# Patient Record
Sex: Female | Born: 1935
Health system: Southern US, Community
[De-identification: ages and names within clinical notes are randomized; demographics above are authoritative.]

## PROBLEM LIST (undated history)

## (undated) DIAGNOSIS — M545 Low back pain, unspecified: Secondary | ICD-10-CM

## (undated) DIAGNOSIS — K922 Gastrointestinal hemorrhage, unspecified: Secondary | ICD-10-CM

## (undated) DIAGNOSIS — M2550 Pain in unspecified joint: Secondary | ICD-10-CM

## (undated) DIAGNOSIS — I10 Essential (primary) hypertension: Secondary | ICD-10-CM

## (undated) DIAGNOSIS — N189 Chronic kidney disease, unspecified: Secondary | ICD-10-CM

## (undated) DIAGNOSIS — K5792 Diverticulitis of intestine, part unspecified, without perforation or abscess without bleeding: Secondary | ICD-10-CM

## (undated) DIAGNOSIS — I82409 Acute embolism and thrombosis of unspecified deep veins of unspecified lower extremity: Secondary | ICD-10-CM

## (undated) DIAGNOSIS — N816 Rectocele: Secondary | ICD-10-CM

## (undated) DIAGNOSIS — N879 Dysplasia of cervix uteri, unspecified: Secondary | ICD-10-CM

## (undated) DIAGNOSIS — R0602 Shortness of breath: Secondary | ICD-10-CM

## (undated) DIAGNOSIS — C801 Malignant (primary) neoplasm, unspecified: Secondary | ICD-10-CM

## (undated) DIAGNOSIS — H3530 Unspecified macular degeneration: Secondary | ICD-10-CM

## (undated) DIAGNOSIS — I6529 Occlusion and stenosis of unspecified carotid artery: Secondary | ICD-10-CM

## (undated) DIAGNOSIS — I890 Lymphedema, not elsewhere classified: Secondary | ICD-10-CM

## (undated) HISTORY — DX: Diverticulitis of intestine, part unspecified, without perforation or abscess without bleeding: K57.92

## (undated) HISTORY — DX: Dysplasia of cervix uteri, unspecified: N87.9

## (undated) HISTORY — DX: Occlusion and stenosis of unspecified carotid artery: I65.29

## (undated) HISTORY — DX: Essential (primary) hypertension: I10

## (undated) HISTORY — DX: Malignant (primary) neoplasm, unspecified: C80.1

## (undated) HISTORY — DX: Low back pain, unspecified: M54.50

## (undated) HISTORY — DX: Gastrointestinal hemorrhage, unspecified: K92.2

## (undated) HISTORY — DX: Lymphedema, not elsewhere classified: I89.0

## (undated) HISTORY — PX: OTHER SURGICAL HISTORY: SHX169

## (undated) HISTORY — DX: Rectocele: N81.6

## (undated) HISTORY — PX: OVARIAN CYST SURGERY: SHX726

## (undated) HISTORY — PX: SKIN CANCER EXCISION: SHX779

## (undated) HISTORY — PX: RECTOCELE REPAIR: SHX761

## (undated) HISTORY — DX: Low back pain: M54.5

## (undated) HISTORY — DX: Acute embolism and thrombosis of unspecified deep veins of unspecified lower extremity: I82.409

## (undated) HISTORY — DX: Shortness of breath: R06.02

## (undated) HISTORY — DX: Unspecified macular degeneration: H35.30

## (undated) HISTORY — DX: Pain in unspecified joint: M25.50

## (undated) HISTORY — PX: ABDOMINAL HYSTERECTOMY: SHX81

## (undated) HISTORY — DX: Chronic kidney disease, unspecified: N18.9

---

## 1998-01-17 ENCOUNTER — Ambulatory Visit: Admission: RE | Admit: 1998-01-17 | Discharge: 1998-01-17 | Payer: Self-pay | Admitting: Internal Medicine

## 1998-05-25 ENCOUNTER — Ambulatory Visit (HOSPITAL_COMMUNITY): Admission: RE | Admit: 1998-05-25 | Discharge: 1998-05-25 | Payer: Self-pay | Admitting: Obstetrics & Gynecology

## 2000-03-06 ENCOUNTER — Encounter: Admission: RE | Admit: 2000-03-06 | Discharge: 2000-03-06 | Payer: Self-pay | Admitting: Internal Medicine

## 2000-03-06 ENCOUNTER — Encounter: Payer: Self-pay | Admitting: Internal Medicine

## 2000-11-14 ENCOUNTER — Other Ambulatory Visit: Admission: RE | Admit: 2000-11-14 | Discharge: 2000-11-14 | Payer: Self-pay

## 2000-11-19 ENCOUNTER — Ambulatory Visit (HOSPITAL_COMMUNITY): Admission: RE | Admit: 2000-11-19 | Discharge: 2000-11-19 | Payer: Self-pay

## 2000-11-27 ENCOUNTER — Ambulatory Visit (HOSPITAL_COMMUNITY): Admission: RE | Admit: 2000-11-27 | Discharge: 2000-11-27 | Payer: Self-pay | Admitting: Otolaryngology

## 2000-11-27 ENCOUNTER — Encounter: Payer: Self-pay | Admitting: Otolaryngology

## 2001-02-14 ENCOUNTER — Encounter: Payer: Self-pay | Admitting: Urology

## 2001-02-14 ENCOUNTER — Ambulatory Visit (HOSPITAL_COMMUNITY): Admission: RE | Admit: 2001-02-14 | Discharge: 2001-02-14 | Payer: Self-pay | Admitting: Urology

## 2001-02-26 ENCOUNTER — Encounter: Payer: Self-pay | Admitting: Urology

## 2001-02-26 ENCOUNTER — Ambulatory Visit (HOSPITAL_COMMUNITY): Admission: RE | Admit: 2001-02-26 | Discharge: 2001-02-26 | Payer: Self-pay | Admitting: Urology

## 2001-10-10 ENCOUNTER — Ambulatory Visit (HOSPITAL_COMMUNITY): Admission: RE | Admit: 2001-10-10 | Discharge: 2001-10-10 | Payer: Self-pay

## 2002-08-31 ENCOUNTER — Ambulatory Visit (HOSPITAL_COMMUNITY): Admission: RE | Admit: 2002-08-31 | Discharge: 2002-08-31 | Payer: Self-pay | Admitting: Pulmonary Disease

## 2002-09-16 ENCOUNTER — Encounter: Payer: Self-pay | Admitting: Unknown Physician Specialty

## 2002-09-16 ENCOUNTER — Ambulatory Visit (HOSPITAL_COMMUNITY): Admission: RE | Admit: 2002-09-16 | Discharge: 2002-09-16 | Payer: Self-pay | Admitting: Unknown Physician Specialty

## 2003-09-06 ENCOUNTER — Encounter: Payer: Self-pay | Admitting: Orthopedic Surgery

## 2003-10-01 ENCOUNTER — Ambulatory Visit (HOSPITAL_COMMUNITY): Admission: RE | Admit: 2003-10-01 | Discharge: 2003-10-01 | Payer: Self-pay | Admitting: Orthopedic Surgery

## 2003-10-12 ENCOUNTER — Other Ambulatory Visit: Admission: RE | Admit: 2003-10-12 | Discharge: 2003-10-12 | Payer: Self-pay | Admitting: Dermatology

## 2003-10-27 ENCOUNTER — Encounter (HOSPITAL_COMMUNITY): Admission: RE | Admit: 2003-10-27 | Discharge: 2003-11-26 | Payer: Self-pay | Admitting: Oncology

## 2003-10-27 ENCOUNTER — Encounter: Admission: RE | Admit: 2003-10-27 | Discharge: 2003-10-27 | Payer: Self-pay | Admitting: Oncology

## 2003-11-09 ENCOUNTER — Ambulatory Visit (HOSPITAL_COMMUNITY): Admission: RE | Admit: 2003-11-09 | Discharge: 2003-11-09 | Payer: Self-pay | Admitting: Pulmonary Disease

## 2004-01-10 ENCOUNTER — Ambulatory Visit: Admission: RE | Admit: 2004-01-10 | Discharge: 2004-01-10 | Payer: Self-pay | Admitting: Pulmonary Disease

## 2004-02-09 ENCOUNTER — Encounter: Admission: RE | Admit: 2004-02-09 | Discharge: 2004-02-09 | Payer: Self-pay | Admitting: Oncology

## 2004-05-02 ENCOUNTER — Inpatient Hospital Stay (HOSPITAL_COMMUNITY): Admission: RE | Admit: 2004-05-02 | Discharge: 2004-05-05 | Payer: Self-pay | Admitting: Orthopedic Surgery

## 2004-05-02 ENCOUNTER — Ambulatory Visit: Payer: Self-pay | Admitting: Orthopedic Surgery

## 2004-05-02 HISTORY — PX: OTHER SURGICAL HISTORY: SHX169

## 2004-05-23 ENCOUNTER — Ambulatory Visit: Payer: Self-pay | Admitting: Orthopedic Surgery

## 2004-06-26 ENCOUNTER — Ambulatory Visit: Payer: Self-pay | Admitting: Orthopedic Surgery

## 2004-07-26 ENCOUNTER — Ambulatory Visit: Payer: Self-pay | Admitting: Orthopedic Surgery

## 2004-08-07 ENCOUNTER — Ambulatory Visit: Payer: Self-pay | Admitting: Orthopedic Surgery

## 2004-08-28 ENCOUNTER — Ambulatory Visit: Payer: Self-pay | Admitting: Orthopedic Surgery

## 2004-11-29 ENCOUNTER — Ambulatory Visit: Payer: Self-pay | Admitting: Orthopedic Surgery

## 2004-12-14 ENCOUNTER — Ambulatory Visit: Payer: Self-pay | Admitting: Orthopedic Surgery

## 2005-01-04 ENCOUNTER — Ambulatory Visit: Payer: Self-pay | Admitting: Orthopedic Surgery

## 2005-06-06 ENCOUNTER — Ambulatory Visit: Payer: Self-pay | Admitting: Orthopedic Surgery

## 2005-08-02 ENCOUNTER — Other Ambulatory Visit: Admission: RE | Admit: 2005-08-02 | Discharge: 2005-08-02 | Payer: Self-pay | Admitting: *Deleted

## 2005-08-15 ENCOUNTER — Other Ambulatory Visit: Admission: RE | Admit: 2005-08-15 | Discharge: 2005-08-15 | Payer: Self-pay | Admitting: *Deleted

## 2005-08-20 ENCOUNTER — Ambulatory Visit: Payer: Self-pay | Admitting: Orthopedic Surgery

## 2005-08-21 ENCOUNTER — Encounter (HOSPITAL_COMMUNITY): Admission: RE | Admit: 2005-08-21 | Discharge: 2005-09-20 | Payer: Self-pay | Admitting: Orthopedic Surgery

## 2005-09-10 ENCOUNTER — Ambulatory Visit: Payer: Self-pay | Admitting: Orthopedic Surgery

## 2005-11-28 ENCOUNTER — Ambulatory Visit (HOSPITAL_COMMUNITY): Admission: RE | Admit: 2005-11-28 | Discharge: 2005-11-28 | Payer: Self-pay | Admitting: Pulmonary Disease

## 2006-02-28 ENCOUNTER — Observation Stay (HOSPITAL_COMMUNITY): Admission: RE | Admit: 2006-02-28 | Discharge: 2006-03-03 | Payer: Self-pay | Admitting: Obstetrics and Gynecology

## 2007-06-06 ENCOUNTER — Ambulatory Visit (HOSPITAL_COMMUNITY): Admission: RE | Admit: 2007-06-06 | Discharge: 2007-06-06 | Payer: Self-pay | Admitting: Pulmonary Disease

## 2007-06-20 ENCOUNTER — Observation Stay (HOSPITAL_COMMUNITY): Admission: EM | Admit: 2007-06-20 | Discharge: 2007-06-22 | Payer: Self-pay | Admitting: Emergency Medicine

## 2008-03-11 ENCOUNTER — Encounter: Payer: Self-pay | Admitting: Orthopedic Surgery

## 2008-03-11 ENCOUNTER — Ambulatory Visit (HOSPITAL_COMMUNITY): Admission: RE | Admit: 2008-03-11 | Discharge: 2008-03-11 | Payer: Self-pay | Admitting: Pulmonary Disease

## 2008-09-01 ENCOUNTER — Encounter: Payer: Self-pay | Admitting: Orthopedic Surgery

## 2009-02-07 ENCOUNTER — Ambulatory Visit: Payer: Self-pay | Admitting: Orthopedic Surgery

## 2009-02-07 DIAGNOSIS — M545 Low back pain: Secondary | ICD-10-CM

## 2009-02-07 DIAGNOSIS — M25559 Pain in unspecified hip: Secondary | ICD-10-CM | POA: Insufficient documentation

## 2009-02-08 ENCOUNTER — Telehealth (INDEPENDENT_AMBULATORY_CARE_PROVIDER_SITE_OTHER): Payer: Self-pay | Admitting: *Deleted

## 2009-02-28 ENCOUNTER — Encounter (HOSPITAL_COMMUNITY): Admission: RE | Admit: 2009-02-28 | Discharge: 2009-03-17 | Payer: Self-pay | Admitting: Orthopedic Surgery

## 2009-02-28 ENCOUNTER — Encounter: Payer: Self-pay | Admitting: Orthopedic Surgery

## 2009-03-14 ENCOUNTER — Telehealth: Payer: Self-pay | Admitting: Orthopedic Surgery

## 2009-06-14 ENCOUNTER — Ambulatory Visit (HOSPITAL_COMMUNITY): Admission: RE | Admit: 2009-06-14 | Discharge: 2009-06-14 | Payer: Self-pay | Admitting: Pulmonary Disease

## 2009-06-20 ENCOUNTER — Ambulatory Visit (HOSPITAL_COMMUNITY): Admission: RE | Admit: 2009-06-20 | Discharge: 2009-06-20 | Payer: Self-pay | Admitting: Pulmonary Disease

## 2009-07-06 ENCOUNTER — Encounter: Payer: Self-pay | Admitting: Orthopedic Surgery

## 2009-07-07 ENCOUNTER — Encounter: Payer: Self-pay | Admitting: Orthopedic Surgery

## 2009-07-12 ENCOUNTER — Encounter: Payer: Self-pay | Admitting: Orthopedic Surgery

## 2009-08-05 ENCOUNTER — Inpatient Hospital Stay (HOSPITAL_COMMUNITY): Admission: EM | Admit: 2009-08-05 | Discharge: 2009-08-08 | Payer: Self-pay | Admitting: Emergency Medicine

## 2009-10-14 ENCOUNTER — Ambulatory Visit (HOSPITAL_COMMUNITY): Admission: RE | Admit: 2009-10-14 | Discharge: 2009-10-14 | Payer: Self-pay | Admitting: Pulmonary Disease

## 2009-12-22 ENCOUNTER — Ambulatory Visit: Payer: Self-pay | Admitting: Orthopedic Surgery

## 2009-12-22 DIAGNOSIS — M25569 Pain in unspecified knee: Secondary | ICD-10-CM

## 2009-12-22 DIAGNOSIS — M79609 Pain in unspecified limb: Secondary | ICD-10-CM

## 2009-12-22 DIAGNOSIS — Z96659 Presence of unspecified artificial knee joint: Secondary | ICD-10-CM

## 2009-12-22 LAB — CONVERTED CEMR LAB
CRP: 8.5 mg/dL — ABNORMAL HIGH (ref <0.6)
Eosinophils Absolute: 0.1 10*3/uL (ref 0.0–0.7)
Eosinophils Relative: 1 % (ref 0–5)
HCT: 35.1 % — ABNORMAL LOW (ref 36.0–46.0)
Hemoglobin: 11.4 g/dL — ABNORMAL LOW (ref 12.0–15.0)
Lymphocytes Relative: 32 % (ref 12–46)
Lymphs Abs: 2.2 10*3/uL (ref 0.7–4.0)
MCV: 97.8 fL (ref 78.0–100.0)
Monocytes Absolute: 0.6 10*3/uL (ref 0.1–1.0)
Platelets: 209 10*3/uL (ref 150–400)
RDW: 14.3 % (ref 11.5–15.5)
WBC: 6.9 10*3/uL (ref 4.0–10.5)

## 2010-01-09 ENCOUNTER — Ambulatory Visit: Payer: Self-pay | Admitting: Orthopedic Surgery

## 2010-02-13 ENCOUNTER — Ambulatory Visit: Payer: Self-pay | Admitting: Orthopedic Surgery

## 2010-02-13 DIAGNOSIS — IMO0002 Reserved for concepts with insufficient information to code with codable children: Secondary | ICD-10-CM

## 2010-03-27 ENCOUNTER — Ambulatory Visit: Payer: Self-pay | Admitting: Orthopedic Surgery

## 2010-04-04 DIAGNOSIS — R0602 Shortness of breath: Secondary | ICD-10-CM

## 2010-04-04 HISTORY — DX: Shortness of breath: R06.02

## 2010-04-05 ENCOUNTER — Observation Stay (HOSPITAL_COMMUNITY)
Admission: EM | Admit: 2010-04-05 | Discharge: 2010-04-07 | Payer: Self-pay | Source: Home / Self Care | Admitting: Emergency Medicine

## 2010-04-05 ENCOUNTER — Ambulatory Visit: Payer: Self-pay | Admitting: Orthopedic Surgery

## 2010-04-06 ENCOUNTER — Encounter: Payer: Self-pay | Admitting: Orthopedic Surgery

## 2010-04-25 ENCOUNTER — Ambulatory Visit: Payer: Self-pay | Admitting: Orthopedic Surgery

## 2010-04-25 DIAGNOSIS — S43086A Other dislocation of unspecified shoulder joint, initial encounter: Secondary | ICD-10-CM | POA: Insufficient documentation

## 2010-05-03 ENCOUNTER — Encounter: Payer: Self-pay | Admitting: Orthopedic Surgery

## 2010-05-09 ENCOUNTER — Encounter (INDEPENDENT_AMBULATORY_CARE_PROVIDER_SITE_OTHER): Payer: Self-pay | Admitting: *Deleted

## 2010-05-09 ENCOUNTER — Encounter: Payer: Self-pay | Admitting: Orthopedic Surgery

## 2010-05-16 ENCOUNTER — Ambulatory Visit: Payer: Self-pay | Admitting: Orthopedic Surgery

## 2010-05-24 ENCOUNTER — Encounter (HOSPITAL_COMMUNITY)
Admission: RE | Admit: 2010-05-24 | Discharge: 2010-06-23 | Payer: Self-pay | Source: Home / Self Care | Attending: Orthopedic Surgery | Admitting: Orthopedic Surgery

## 2010-06-06 ENCOUNTER — Encounter: Payer: Self-pay | Admitting: Orthopedic Surgery

## 2010-06-20 ENCOUNTER — Encounter (HOSPITAL_COMMUNITY)
Admission: RE | Admit: 2010-06-20 | Discharge: 2010-07-18 | Payer: Self-pay | Source: Home / Self Care | Attending: Orthopedic Surgery | Admitting: Orthopedic Surgery

## 2010-06-21 ENCOUNTER — Encounter: Payer: Self-pay | Admitting: Orthopedic Surgery

## 2010-06-21 ENCOUNTER — Ambulatory Visit
Admission: RE | Admit: 2010-06-21 | Discharge: 2010-06-21 | Payer: Self-pay | Source: Home / Self Care | Attending: Orthopedic Surgery | Admitting: Orthopedic Surgery

## 2010-06-22 ENCOUNTER — Encounter: Payer: Self-pay | Admitting: Orthopedic Surgery

## 2010-06-26 ENCOUNTER — Encounter (HOSPITAL_COMMUNITY)
Admission: RE | Admit: 2010-06-26 | Discharge: 2010-07-18 | Payer: Self-pay | Source: Home / Self Care | Attending: Orthopedic Surgery | Admitting: Orthopedic Surgery

## 2010-07-03 ENCOUNTER — Encounter: Payer: Self-pay | Admitting: Orthopedic Surgery

## 2010-07-06 ENCOUNTER — Encounter: Payer: Self-pay | Admitting: Orthopedic Surgery

## 2010-07-09 ENCOUNTER — Encounter: Payer: Self-pay | Admitting: Pulmonary Disease

## 2010-07-14 DIAGNOSIS — I82409 Acute embolism and thrombosis of unspecified deep veins of unspecified lower extremity: Secondary | ICD-10-CM

## 2010-07-14 HISTORY — DX: Acute embolism and thrombosis of unspecified deep veins of unspecified lower extremity: I82.409

## 2010-07-20 ENCOUNTER — Encounter (HOSPITAL_COMMUNITY)
Admission: RE | Admit: 2010-07-20 | Discharge: 2010-07-20 | Disposition: A | Discharge status: Home / Self Care | Payer: Medicare Other | Source: Ambulatory Visit | Attending: Orthopedic Surgery | Admitting: Orthopedic Surgery

## 2010-07-20 ENCOUNTER — Encounter: Payer: Self-pay | Admitting: Orthopedic Surgery

## 2010-07-20 DIAGNOSIS — M25519 Pain in unspecified shoulder: Secondary | ICD-10-CM | POA: Insufficient documentation

## 2010-07-20 DIAGNOSIS — M6281 Muscle weakness (generalized): Secondary | ICD-10-CM | POA: Insufficient documentation

## 2010-07-20 DIAGNOSIS — Z5189 Encounter for other specified aftercare: Secondary | ICD-10-CM | POA: Insufficient documentation

## 2010-07-20 NOTE — Miscellaneous (Signed)
Summary: OT progress note  OT progress note   Imported By: Ruffin Pyo 06/22/2010 15:51:12  _____________________________________________________________________  External Attachment:    Type:   Image     Comment:   External Document

## 2010-07-20 NOTE — Consult Note (Signed)
Summary: Consultation Rpt Dr Evlyn Courier  Consultation Rpt Dr Evlyn Courier   Imported By: Ihor Austin 07/28/2009 08:23:22  _____________________________________________________________________  External Attachment:    Type:   Image     Comment:   External Document

## 2010-07-20 NOTE — Assessment & Plan Note (Signed)
Summary: HOSP FOL/UP/DISLOC RT SHOULDER/XRAYS [facility]/MEDICARE/CAF   Visit Type:  new problem Referring Provider:  [facility] Primary Provider:  Dr. Luan Pulling   CC:  right shoulder dislocation.  History of Present Illness: [id number] shoulder dislocation   Date of injury was April 05, 2010.  Patient fell at [facility].  She was admitted to the hospital after closed reduction and emergency room. There was question of an avulsion fracture to the greater tuberosity.  Postreduction films show concentric reduction  Treatment: sling  MEDS: see consult  Complaints:  some movements cause her pain. She is trying not to take pain medicine because she wants to be alert. Therapy has been coming to her house. She has a Marine scientist from [employer],  come to her house 5 days a week for 4 hours a day.  Today, scheduled for: follow up   Allergies: 1)  ! Codeine 2)  ! Hydrocodone 3)  ! Ultram 4)  ! Phenergan  Past History:  Past Medical History: macular degeneration glaucoma precancerous lesions of cervix rectocele Diverticulitis L spine pain Pain in joints hearing loss cholesterol htn Bladder  Review of Systems Constitutional:  Denies weight loss, weight gain, fever, chills, and fatigue. Cardiovascular:  Denies chest pain, palpitations, fainting, and murmurs. Respiratory:  Complains of tightness; denies short of breath, wheezing, couch, pain on inspiration, and snoring . Gastrointestinal:  Denies heartburn, nausea, vomiting, diarrhea, constipation, and blood in your stools. Genitourinary:  Complains of urgency; denies frequency, difficulty urinating, painful urination, flank pain, and bleeding in urine. Neurologic:  Denies numbness, tingling, unsteady gait, dizziness, tremors, and seizure. Musculoskeletal:  Complains of joint pain, swelling, instability, and redness; denies stiffness, heat, and muscle pain. Endocrine:  Complains of exessive urination; denies excessive thirst and heat or cold  intolerance. Psychiatric:  Complains of anxiety; denies nervousness, depression, and hallucinations. Skin:  Complains of itching and redness; denies changes in the skin, poor healing, and rash. HEENT:  Denies blurred or double vision, eye pain, redness, and watering. Immunology:  Denies seasonal allergies, sinus problems, and allergic to bee stings. Hemoatologic:  Denies easy bleeding and brusing.  Physical Exam  Additional Exam:  physical examination shows that she has normal function of the hand, including finger abduction and adduction, finger extension wrist extension wrist flexion elbow flexion elbow extension.  She is an excellent pulse.     Impression & Recommendations:  Problem # 1:  DISLOCATION CLOSED SHOULDER NEC (ICD-831.09) Assessment New  stable RIGHT shoulder dislocation. Patient has been in a sling for 3 weeks. Should be able to advance in physical therapy as tolerated. [facility]. Patient will follow up in 3 weeks for reeval  Orders: Est. Patient Level II [id number])  Patient Instructions: 1)  Please schedule a follow-up appointment in 3 weeks. 2)  Start [facility]  3)  The sling will be removed in a week    Orders Added: 1)  Est. Patient Level II [id number]

## 2010-07-20 NOTE — Letter (Signed)
Summary: Generic Letter update  [facility]  [address] [place]  [place], [place] [address]   Phone: [phone]  Fax: [phone]       07/03/2010  [facility] [address] [place], [place]  [address]  Dear Ms. Forness, and to Whom This May Concern:   RE:  Kebra Halker    Date of Birth Nov 17, 2023   Upon discharge from hospital for RT shoulder dislocation, patient was recommended to have in-patient/custodial nursing home care and support, as she could not go home by herself.  She felt she would better be served with home therapy and home care for her RT shoulder dislocation, rather than nursing home care, which was ordered.  In addition, patient needed ADL's support, including bathing, dressing, toileting.           Sincerely,    Demetrius Revel, MD

## 2010-07-20 NOTE — Assessment & Plan Note (Signed)
Summary: 1 M RE-CHECK/RESP TO MED/MEDICARE,[employer] [employer]/CAF   Visit Type:  Follow-up Referring Allean Montfort:  self  Primary Abhinav Mayorquin:  Dr. Luan Pulling   CC:  left knee pain (leg).  History of Present Illness: I saw Cindy Schultz in the office today for a followup visit.  She is a 77 years old woman with the complaint of:  left knee pain and left leg pain   Medial knee pain and pes bursa tenderness bilateral actually but left greater than right   The patient has been worked up for infection we did not think that she had knee joint infection with periprosthetic infection so we are treating her for inflammatory disease/process which has responded well to prednisone  She still has some medial pain as described above  She has been on both knees as described  Exam shows no pain in the shin anymore but pain along the medial as bursa insertion as well as the medial hamstring.  There are no soft tissue masses there is no joint effusion she has free and easy range of motion in the LEFT knee joint with 120 of flexion and full extension the knee is stable.  Bursitis LEFT knee and RIGHT knee inject LEFT knee  LEFT knee bursitis injection Verbal consent was obtained. The knee was prepped with alcohol and ethyl chloride. 1 cc of depomedrol 40mg /cc and 4 cc of lidocaine 1% was injected. there were no complications.    She stopped prednisone as a prescription has run out I want to see how she does off of it and recheck in 6 weeks   diagnosis bursitis  Allergies: 1)  ! Codeine 2)  ! Hydrocodone 3)  ! Ultram  Review of Systems Constitutional:  Denies fever, chills, and fatigue.   Impression & Recommendations:  Problem # 1:  KNEE PAIN (ICD-719.46)  Orders: Est. Patient Level III [id number])  Problem # 2:  ANSERINE BURSITIS, LEFT (ICD-726.61) Assessment: Comment Only  Orders: Est. Patient Level III [id number]) Joint Aspirate / Injection, Large (20610) Depo- Medrol 40mg  (J1030)  Patient  Instructions: 1)  You have received an injection of cortisone today. You may experience increased pain at the injection site. Apply ice pack to the area for 20 minutes every 2 hours and take 2 xtra strength tylenol every 8 hours. This increased pain will usually resolve in 24 hours. The injection will take effect in 3-10 days.  2)  recheck in 6 weeks

## 2010-07-20 NOTE — Assessment & Plan Note (Signed)
Summary: 3 WK RE-CK LT SHOULDER DISLOCATION/MEDICARE/CAF   Visit Type:  Follow-up Referring Natashia Roseman:  [facility] Primary Zahriyah Joo:  Dr. Luan Pulling   CC:  recheck shoulder.  History of Present Illness: [id number] shoulder dislocation   Date of injury was April 05, 2010.  Mechanism:Patient fell at [facility].  History:She was admitted to the hospital after closed reduction and emergency room. There was question of an avulsion fracture to the greater tuberosity.  Postreduction films show concentric reduction  Treatment: sling  MEDS: Tylenol for pain.  Today is 3 week recheck after PT and sling removal.  subjective complaints of soreness in the RIGHT shoulder  She can abduct the shoulder she can forward elevate the shoulder saw him not to soak where it about the cuff tear possibilities right now  Unlike her continue her physical therapy and follow with me in about a month to check on range of motion and strength in the rotator cuff as well.    Allergies: 1)  ! Codeine 2)  ! Hydrocodone 3)  ! Ultram 4)  ! Phenergan   Impression & Recommendations:  Problem # 1:  DISLOCATION CLOSED SHOULDER NEC (ICD-831.09) Assessment Improved  Orders: Est. Patient Level II [id number])   Orders Added: 1)  Est. Patient Level II [id number]

## 2010-07-20 NOTE — Miscellaneous (Signed)
Summary: OT clinical evaluation  OT clinical evaluation   Imported By: Ruffin Pyo 06/07/2010 07:43:29  _____________________________________________________________________  External Attachment:    Type:   Image     Comment:   External Document

## 2010-07-20 NOTE — Assessment & Plan Note (Signed)
Summary: 1 M RE-CK LT SHOULDER//MEDICARE/CAF   Visit Type:  Follow-up Referring Provider:  [facility] Primary Provider:  Dr. Luan Pulling   CC:  recheck shoulder.  History of Present Illness: [id number] shoulder dislocation   Date of injury was April 05, 2010./ 11 WEEKS POST INJ   Mechanism:Patient fell at [facility].  History:She was admitted to the hospital after closed reduction and emergency room. There was question of an avulsion fracture to the greater tuberosity.  Postreduction films show concentric reduction  Treatment: sling  MEDS: Tylenol for pain, Coumadin 5mg  for blood clot in leg.  Today is one month recheck after PT [facility] to check ROM.  Still has decreased ROM but doing better than before.  ROS: Has some left knee swelling, post TKA./ this is been worked up with laboratory studies x-rays serially and these have been negative.  She actually has bilateral leg pain and tenderness which responds well to prednisone so we will some that      Allergies: 1)  ! Codeine 2)  ! Hydrocodone 3)  ! Ultram 4)  ! Phenergan  Physical Exam  Additional Exam:  as far as her arm goes she has 40 of external rotation 125 of forward elevation she can reach top of her head, she can reach across her chest, she can touch her mouth.  With the RIGHT arm.  LEFT knee no swelling no tenderness  has bilateral leg tenderness from her thighs all the way to her toes   Impression & Recommendations:  Problem # 1:  DISLOCATION CLOSED SHOULDER NEC (ICD-831.09) Assessment Improved  Orders: Est. Patient Level II [id number])  Problem # 2:  LEG PAIN, BILATERAL (ICD-729.5)  restart prednisone 5 mg twice a day followup 2 months  Orders: Est. Patient Level II [id number])  Patient Instructions: 1)  Take Prednisone as prescribed for bilateral leg pain 2)  Come back in 2 months Prescriptions: PREDNISONE 5 MG TABS (PREDNISONE) 1 by mouth two times a day  #60 x 1   Entered and Authorized by:   Arther Abbott MD   Signed by:   Arther Abbott MD on 06/21/2010   Method used:   Print then Give to Patient   RxID:   [id number]    Orders Added: 1)  Est. Patient Level II [id number]

## 2010-07-20 NOTE — Miscellaneous (Signed)
Summary: order for OT outpt [facility]  Clinical Lists Changes  Orders: Added new Referral order of Occupational Therapy (OT) - Signed

## 2010-07-20 NOTE — Letter (Signed)
Summary: Office note from Dr. Neomia Dear  Office note from Dr. Neomia Dear   Imported By: Ruffin Pyo 07/12/2009 11:44:09  _____________________________________________________________________  External Attachment:    Type:   Image     Comment:   External Document

## 2010-07-20 NOTE — Assessment & Plan Note (Signed)
Summary: 57 WK RE-CK/RESP TO MED/MEDICARE,LOYAL AM/CAF   Visit Type:  Follow-up Referring Cindy Schultz:  self  Primary Cindy Schultz:  Dr. Luan Pulling   CC:  recheck.  History of Present Illness: I saw Cindy Schultz in the office today for a followup visit.  She is a 77 years old woman with the complaint of:  left knee pain and left leg pain   Today is 6 week recheck after injection left knee for bursitis.  Injection did not relieve her bursitis, but she can bend her knee better after being on prednisone. She is not walking with a cane anymore.  She is off Prednisone, the Prednisone did help.  Review of systems ;Has pain in both ankles with swelling, Dr. Luan Pulling gave fluid pill and it helped with the swelling, started having cramps, she stopped.    Allergies: 1)  ! Codeine 2)  ! Hydrocodone 3)  ! Ultram 4)  ! Phenergan  Physical Exam  Additional Exam:  LEFT knee prosthesis is stable in anterior posterior, and medial lateral directions. Incision looks good. There is no fluid in the joint. She has tenderness over both medial hamstring tendons and as anserine bursa. If it does not affect her range of motion in any way. He is neurovascularly intact.   Impression & Recommendations:  Problem # 1:  ANSERINE BURSITIS, LEFT (ICD-726.61)  Orders: Est. Patient Level III [id number])  Problem # 2:  TOTAL KNEE FOLLOW-UP (ICD-V43.65)  Orders: Est. Patient Level III [id number])  Medications Added to Medication List This Visit: 1)  Lisinopril 20 Mg Tabs (Lisinopril) 2)  Furosemide 40 Mg Tabs (Furosemide) 3)  Ibuprofen 200 Mg Tabs (Ibuprofen)  Patient Instructions: 1)  TAKE IBUPROFEN 200 MG 1 three times a day AND COME BACK IN 1 MONTH

## 2010-07-20 NOTE — Miscellaneous (Signed)
Summary: [facility] orders  [facility] orders   Imported By: Ruffin Pyo 05/03/2010 08:30:44  _____________________________________________________________________  External Attachment:    Type:   Image     Comment:   External Document

## 2010-07-20 NOTE — Miscellaneous (Signed)
Summary: Rehab Report  Rehab Report   Imported By: Tomie China 06/21/2010 14:08:16  _____________________________________________________________________  External Attachment:    Type:   Image     Comment:   External Document

## 2010-07-20 NOTE — Miscellaneous (Signed)
Summary: [facility] order [facility]  [facility] order [facility]   Imported By: Ihor Austin 04/25/2010 18:35:37  _____________________________________________________________________  External Attachment:    Type:   Image     Comment:   External Document

## 2010-07-20 NOTE — Consult Note (Signed)
Summary: Hospital Consult RT shoulder  Hospital Consult RT shoulder   Imported By: Ihor Austin 04/18/2010 19:04:20  _____________________________________________________________________  External Attachment:    Type:   Image     Comment:   External Document

## 2010-07-20 NOTE — Letter (Signed)
Summary: Generic Letter  [facility]  [address] [place]  [place], [place] [address]   Phone: [phone]  Fax: [phone]    06/21/2010    This is a letter in support of the patient mentioned below regarding her RIGHT shoulder injury on October 19 of 2011.  She was not a patient that was felt to need inpatient rehabilitation and would better be served with home therapy and home care for her RIGHT shoulder dislocation.  She did need for care and support including rides to the office.  She needed ADLs support.  [facility] [address] [place], [place]  [address]   Sincerely,   Arther Abbott MD [facility]

## 2010-07-20 NOTE — Miscellaneous (Signed)
Summary: Home Health request for therapy order  Home Health request for therapy order   Imported By: Ruffin Pyo 05/10/2010 09:06:04  _____________________________________________________________________  External Attachment:    Type:   Image     Comment:   External Document

## 2010-07-20 NOTE — Assessment & Plan Note (Signed)
Summary: LEFT KNEE PAIN NEEDS XR/MEDICARE/[employer] [employer]/BSF   Visit Type:  Follow-up Referring Provider:  self  Primary Provider:  Dr. Luan Pulling   CC:  left knee pain.  History of Present Illness: I saw Cindy Schultz in the office today for a followup visit.  She is a 77 years old woman with the complaint of:  LEFT knee pain for 2 weeks denies any injury  She complains of increased stiffness swelling and soreness in her LEFT knee and leg.  November 2005 showed a LEFT total knee arthroplasty with the [employer] Scorpio knee system  Other history FYI sees Dr. Ace Gins for ESI's last MRI L spine 06/20/09 for review if needed.  Xrays today of the left knee.  Meds: Eye drops, Welchol, Vitamins, Calcium plus D, Ditropan, Glucosamine, Allopurinol, Bilberry plus Lutein, Naprosyn, no pain med.     Current Medications (verified): 1)  Avapro 2)  Vit C 3)  Vit B12 4)  Calcium Plus D 5)  Niacin 6)  Neurontin 100 Mg Caps (Gabapentin) .Marland Kitchen.. 1 By Mouth Hs 7)  Prednisone (Pak) 5 Mg Tabs (Prednisone) .... As Directed For 12 Days  Allergies (verified): 1)  ! Codeine 2)  ! Hydrocodone 3)  ! Ultram  Past History:  Past Medical History: Last updated: 02/07/2009 macular degeneration glaucoma precancerous lesions of cervix rectocele Diverticulitis L spine pain Pain in joints hearing loss cholesterol htn  Past Surgical History: Last updated: 02/07/2009 salk tracheotomy fallen uterus ovarian cyst bladder tac Hemorrhoidectomy met osteostomy 05-02-04 left total knee replacement/Zayin Valadez mortons neuroma elevate metatarsal arch rupture rt groin appendix rectocele cataracts corneal erosion  Family History: Last updated: 02/07/2009 Family History Coronary Heart Disease female < 4 Family History of Arthritis  Social History: Last updated: 02/07/2009 Patient is divorced.  no smoking no alcohol use 1 cup of coffee daily  Review of Systems Neurologic:  Complains of unsteady gait;  denies numbness, tingling, and tremors. Musculoskeletal:  Complains of joint pain, swelling, stiffness, and heat.  Physical Exam  Additional Exam:  Cindy Schultz is coming in today ambulating with a cane, her parents is normal.  She has increased venous enlargement in both legs but it is minor pulses are good skin is intact incision looks good no redness swelling or warmth of the knee however the LEFT knee anteriorly over the patella has a large soft tissue mass which feels full almost like a prepatellar bursitis.  The knee joint has no effusion  She is tender along the joint lines of the knee suprapatellar pouch posterior lower thigh anterior and posterior tibial area including the soft tissues as well as the Achilles tendon  She is increased rubor to both pretibial areas  The range of motion in her LEFT knee, full extension and flexion to 90 and then pain from 90 through 110  The knee feels stable.   Impression & Recommendations:  Problem # 1:  TOTAL KNEE FOLLOW-UP (ICD-V43.65) Assessment Comment Only  Orders: New Patient Level IV [id number])  Problem # 2:  KNEE PAIN [id number]) Assessment: Comment Only  Unclear as the etiology of her knee pain and leg pain  She does have x-rays today compared to previous films I have 2 sets from 2006 and 2007 and they showed no wear.  There is no loosening.  Today's films show no wear  Recommend workup for infection try steroid Dosepak may be inflammation she does have some degenerative disc disease which may be contributing  Orders: New Patient Level IV [id number]) Knee x-ray,  3 views (  73562)  Medications Added to Medication List This Visit: 1)  Prednisone (pak) 5 Mg Tabs (Prednisone) .... As directed for 12 days  Other Orders: T-CBC w/Diff [phone]) T-C-Reactive Protein [phone]) T-Sed Rate (Automated) [phone])  Patient Instructions: 1)  Please schedule a follow-up appointment in 2 weeks. 2)  lab reports needed    Prescriptions: PREDNISONE (PAK) 5 MG TABS (PREDNISONE) as directed for 12 days  #1 x 1   Entered and Authorized by:   Arther Abbott MD   Signed by:   Arther Abbott MD on 12/22/2009   Method used:   Print then Give to Patient   RxID:   [id number]

## 2010-07-20 NOTE — Miscellaneous (Signed)
Summary: Rehab Referal form  Rehab Referal form   Imported By: Ruffin Pyo 06/06/2010 17:31:49  _____________________________________________________________________  External Attachment:    Type:   Image     Comment:   External Document

## 2010-07-20 NOTE — Letter (Signed)
Summary: [employer] letter of denial  [employer] letter of denial   Imported By: Ihor Austin 06/22/2010 09:53:29  _____________________________________________________________________  External Attachment:    Type:   Image     Comment:   External Document

## 2010-07-20 NOTE — Assessment & Plan Note (Signed)
Summary: 2 WK RE-CK/REVIEW LABS/MEDICARE,[facility]Y AMER/CAF   Visit Type:  Follow-up Referring Provider:  self  Primary Provider:  Dr. Luan Pulling   CC:  left knee pain.  History of Present Illness: I saw Kaysie Dement in the office today for an initial visit.  She is a 77 years old woman with the complaint of:  left knee pain, was given Prednisone, helped alot. Has Back pain also.  Today is 2 week recheck left knee and go over Lab work.  Meds: Eye drops, Welchol, Vitamins, Calcium plus D, Ditropan, Glucosamine, Allopurinol, Bilberry plus Lutein, Naprosyn. 1) CBC with Diff (10010)   WBC                       6.9 K/uL                    4.0-10.5      Granulocyte %             58 %                        43-77   Absolute Gran             4.0 K/uL                    1.7-7.7      Tests: (2) Sed Rate (ESR) (15010)   Sed Rate (ESR)       [H]  48 mm/hr                    0-22  Tests: (3) CRP (C-Reactive Protein) [id number])  CRP (C-Reactive Protein)                        [H]  8.5 mg/dL                   <0.6  Allergies: 1)  ! Codeine 2)  ! Hydrocodone 3)  ! Ultram  Physical Exam  Extremities:  The exam shows tenderness in both tibia from the ankle up to the knees including both knee joints.  There is no knee joint effusion to tap on the LEFT knee.  There is no warmth to the LEFT knee there is no incisional drainage or tenderness.  The patient can actively flex and extend the knee 0-115   Impression & Recommendations:  Problem # 1:  LEG PAIN, BILATERAL (ICD-729.5) Assessment Improved  she improved on prednisone so it like to continue it.  I do not think this is related to the prosthesis.  She did have an elevated sed rate and C-reactive protein but I think it's inflammatory related to her lower extremities and this may be inflammatory disease vs. phlebitis vs. venous stasis disease.  Orders: Est. Patient Level II [id number])  Medications Added to Medication List This Visit: 1)   Prednisone 5 Mg Tabs (prednisone)  .Marland Kitchen.. 1 by mouth two times a day  Patient Instructions: 1)  Please schedule a follow-up appointment in 1 month. Prescriptions: PREDNISONE 5 MG TABS (PREDNISONE) 1 by mouth two times a day  #60 x 0   Entered and Authorized by:   Arther Abbott MD   Signed by:   Arther Abbott MD on 01/09/2010   Method used:   Print then Give to Patient   RxID:   [id number]

## 2010-07-20 NOTE — Miscellaneous (Signed)
Summary: PT clinical evaluation  PT clinical evaluation   Imported By: Ruffin Pyo 07/10/2010 17:01:34  _____________________________________________________________________  External Attachment:    Type:   Image     Comment:   External Document

## 2010-07-21 ENCOUNTER — Ambulatory Visit (HOSPITAL_COMMUNITY)
Admission: RE | Admit: 2010-07-21 | Discharge: 2010-07-21 | Disposition: A | Discharge status: Home / Self Care | Payer: Medicare Other | Source: Ambulatory Visit | Admitting: Orthopedic Surgery

## 2010-07-21 ENCOUNTER — Ambulatory Visit (HOSPITAL_COMMUNITY)
Admission: RE | Admit: 2010-07-21 | Discharge: 2010-07-21 | Disposition: A | Discharge status: Home / Self Care | Payer: Medicare Other | Source: Ambulatory Visit | Attending: Pulmonary Disease | Admitting: Pulmonary Disease

## 2010-07-21 DIAGNOSIS — R269 Unspecified abnormalities of gait and mobility: Secondary | ICD-10-CM | POA: Insufficient documentation

## 2010-07-21 DIAGNOSIS — M6281 Muscle weakness (generalized): Secondary | ICD-10-CM | POA: Insufficient documentation

## 2010-07-21 DIAGNOSIS — Z5189 Encounter for other specified aftercare: Secondary | ICD-10-CM | POA: Insufficient documentation

## 2010-07-21 DIAGNOSIS — Z9181 History of falling: Secondary | ICD-10-CM | POA: Insufficient documentation

## 2010-07-24 ENCOUNTER — Ambulatory Visit (HOSPITAL_COMMUNITY)
Admission: RE | Admit: 2010-07-24 | Discharge: 2010-07-24 | Disposition: A | Discharge status: Home / Self Care | Payer: Medicare Other | Source: Ambulatory Visit | Attending: Orthopedic Surgery | Admitting: Orthopedic Surgery

## 2010-07-24 DIAGNOSIS — M6281 Muscle weakness (generalized): Secondary | ICD-10-CM | POA: Insufficient documentation

## 2010-07-24 DIAGNOSIS — Z5189 Encounter for other specified aftercare: Secondary | ICD-10-CM | POA: Insufficient documentation

## 2010-07-24 DIAGNOSIS — M25519 Pain in unspecified shoulder: Secondary | ICD-10-CM | POA: Insufficient documentation

## 2010-07-24 DIAGNOSIS — M25619 Stiffness of unspecified shoulder, not elsewhere classified: Secondary | ICD-10-CM | POA: Insufficient documentation

## 2010-07-25 ENCOUNTER — Encounter: Payer: Self-pay | Admitting: Orthopedic Surgery

## 2010-07-26 ENCOUNTER — Ambulatory Visit (HOSPITAL_COMMUNITY)
Admission: RE | Admit: 2010-07-26 | Discharge: 2010-07-26 | Disposition: A | Discharge status: Home / Self Care | Payer: Medicare Other | Source: Ambulatory Visit | Attending: Orthopedic Surgery | Admitting: Orthopedic Surgery

## 2010-07-26 DIAGNOSIS — M25519 Pain in unspecified shoulder: Secondary | ICD-10-CM | POA: Insufficient documentation

## 2010-07-26 DIAGNOSIS — R269 Unspecified abnormalities of gait and mobility: Secondary | ICD-10-CM | POA: Insufficient documentation

## 2010-07-26 DIAGNOSIS — M6281 Muscle weakness (generalized): Secondary | ICD-10-CM | POA: Insufficient documentation

## 2010-07-26 DIAGNOSIS — M25619 Stiffness of unspecified shoulder, not elsewhere classified: Secondary | ICD-10-CM | POA: Insufficient documentation

## 2010-07-26 DIAGNOSIS — Z5189 Encounter for other specified aftercare: Secondary | ICD-10-CM | POA: Insufficient documentation

## 2010-07-26 NOTE — Miscellaneous (Signed)
Summary: PT progress note  PT progress note   Imported By: Ruffin Pyo 07/21/2010 08:50:52  _____________________________________________________________________  External Attachment:    Type:   Image     Comment:   External Document

## 2010-07-28 ENCOUNTER — Ambulatory Visit (HOSPITAL_COMMUNITY)
Admission: RE | Admit: 2010-07-28 | Discharge: 2010-07-28 | Disposition: A | Discharge status: Home / Self Care | Payer: Medicare Other | Source: Ambulatory Visit

## 2010-07-28 ENCOUNTER — Ambulatory Visit (HOSPITAL_COMMUNITY)
Admission: RE | Admit: 2010-07-28 | Discharge: 2010-07-28 | Disposition: A | Discharge status: Home / Self Care | Payer: Medicare Other | Source: Ambulatory Visit | Admitting: Occupational Therapy

## 2010-07-31 ENCOUNTER — Ambulatory Visit (HOSPITAL_COMMUNITY): Payer: Medicare Other | Admitting: Occupational Therapy

## 2010-07-31 ENCOUNTER — Ambulatory Visit (HOSPITAL_COMMUNITY): Payer: Medicare Other | Admitting: Specialist

## 2010-08-01 ENCOUNTER — Ambulatory Visit (HOSPITAL_COMMUNITY)
Admission: RE | Admit: 2010-08-01 | Discharge: 2010-08-01 | Disposition: A | Discharge status: Home / Self Care | Payer: Medicare Other | Source: Ambulatory Visit | Attending: Orthopedic Surgery | Admitting: Orthopedic Surgery

## 2010-08-02 ENCOUNTER — Encounter: Payer: Self-pay | Admitting: Orthopedic Surgery

## 2010-08-02 ENCOUNTER — Ambulatory Visit (HOSPITAL_COMMUNITY)
Admission: RE | Admit: 2010-08-02 | Discharge: 2010-08-02 | Disposition: A | Discharge status: Home / Self Care | Payer: Medicare Other | Source: Ambulatory Visit | Attending: Orthopedic Surgery | Admitting: Orthopedic Surgery

## 2010-08-03 NOTE — Miscellaneous (Signed)
Summary: OT Progress note  OT Progress note   Imported By: Ruffin Pyo 07/25/2010 11:30:43  _____________________________________________________________________  External Attachment:    Type:   Image     Comment:   External Document

## 2010-08-04 ENCOUNTER — Ambulatory Visit (HOSPITAL_COMMUNITY): Payer: Medicare Other | Admitting: Specialist

## 2010-08-04 ENCOUNTER — Ambulatory Visit (HOSPITAL_COMMUNITY): Payer: Medicare Other | Attending: Orthopedic Surgery | Admitting: Specialist

## 2010-08-04 DIAGNOSIS — Z9181 History of falling: Secondary | ICD-10-CM | POA: Insufficient documentation

## 2010-08-04 DIAGNOSIS — M6281 Muscle weakness (generalized): Secondary | ICD-10-CM | POA: Insufficient documentation

## 2010-08-04 DIAGNOSIS — R269 Unspecified abnormalities of gait and mobility: Secondary | ICD-10-CM | POA: Insufficient documentation

## 2010-08-04 DIAGNOSIS — IMO0001 Reserved for inherently not codable concepts without codable children: Secondary | ICD-10-CM | POA: Insufficient documentation

## 2010-08-08 ENCOUNTER — Ambulatory Visit (HOSPITAL_COMMUNITY)
Admission: RE | Admit: 2010-08-08 | Discharge: 2010-08-08 | Disposition: A | Discharge status: Home / Self Care | Payer: Medicare Other | Source: Ambulatory Visit | Attending: *Deleted | Admitting: *Deleted

## 2010-08-09 ENCOUNTER — Ambulatory Visit (HOSPITAL_COMMUNITY)
Admission: RE | Admit: 2010-08-09 | Discharge: 2010-08-09 | Disposition: A | Discharge status: Home / Self Care | Payer: Medicare Other | Source: Ambulatory Visit | Attending: *Deleted | Admitting: *Deleted

## 2010-08-11 ENCOUNTER — Ambulatory Visit (HOSPITAL_COMMUNITY)
Admission: RE | Admit: 2010-08-11 | Discharge: 2010-08-11 | Disposition: A | Discharge status: Home / Self Care | Payer: Medicare Other | Source: Ambulatory Visit | Admitting: Physical Therapy

## 2010-08-15 NOTE — Miscellaneous (Signed)
Summary: Rehab Report  Rehab Report   Imported By: Ihor Austin 08/09/2010 10:49:46  _____________________________________________________________________  External Attachment:    Type:   Image     Comment:   External Document

## 2010-08-16 ENCOUNTER — Encounter: Payer: Self-pay | Admitting: Orthopedic Surgery

## 2010-08-22 ENCOUNTER — Ambulatory Visit: Payer: Self-pay | Admitting: Orthopedic Surgery

## 2010-08-29 NOTE — Miscellaneous (Signed)
Summary: PT Progress note  PT Progress note   Imported By: Ruffin Pyo 08/23/2010 08:12:15  _____________________________________________________________________  External Attachment:    Type:   Image     Comment:   External Document

## 2010-08-30 LAB — CBC
MCH: 33.5 pg (ref 26.0–34.0)
MCHC: 34 g/dL (ref 30.0–36.0)
MCV: 98.5 fL (ref 78.0–100.0)
Platelets: 199 10*3/uL (ref 150–400)
RBC: 3.9 MIL/uL (ref 3.87–5.11)
RDW: 13.8 % (ref 11.5–15.5)

## 2010-08-30 LAB — PROTIME-INR: Prothrombin Time: 12.7 seconds (ref 11.6–15.2)

## 2010-08-30 LAB — BASIC METABOLIC PANEL
CO2: 28 mEq/L (ref 19–32)
Chloride: 104 mEq/L (ref 96–112)
Creatinine, Ser: 1.16 mg/dL (ref 0.4–1.2)
GFR calc Af Amer: 54 mL/min — ABNORMAL LOW (ref >60)
Potassium: 3.8 mEq/L (ref 3.5–5.1)
Sodium: 140 mEq/L (ref 135–145)

## 2010-08-30 LAB — DIFFERENTIAL
Basophils Relative: 1 % (ref 0–1)
Eosinophils Absolute: 0.1 10*3/uL (ref 0.0–0.7)
Eosinophils Relative: 1 % (ref 0–5)
Lymphs Abs: 2.9 10*3/uL (ref 0.7–4.0)

## 2010-09-06 ENCOUNTER — Encounter: Payer: Self-pay | Admitting: Orthopedic Surgery

## 2010-09-06 LAB — URINALYSIS, ROUTINE W REFLEX MICROSCOPIC
Ketones, ur: NEGATIVE mg/dL
Nitrite: NEGATIVE
Protein, ur: NEGATIVE mg/dL
Urobilinogen, UA: 0.2 mg/dL (ref 0.0–1.0)

## 2010-09-06 LAB — CBC
HCT: 40.1 % (ref 36.0–46.0)
Hemoglobin: 11 g/dL — ABNORMAL LOW (ref 12.0–15.0)
MCHC: 34.5 g/dL (ref 30.0–36.0)
MCV: 96.3 fL (ref 78.0–100.0)
MCV: 96.5 fL (ref 78.0–100.0)
Platelets: 284 10*3/uL (ref 150–400)
RDW: 13.2 % (ref 11.5–15.5)
RDW: 13.3 % (ref 11.5–15.5)

## 2010-09-06 LAB — BASIC METABOLIC PANEL
BUN: 12 mg/dL (ref 6–23)
BUN: 27 mg/dL — ABNORMAL HIGH (ref 6–23)
BUN: 41 mg/dL — ABNORMAL HIGH (ref 6–23)
CO2: 20 mEq/L (ref 19–32)
CO2: 20 mEq/L (ref 19–32)
CO2: 22 mEq/L (ref 19–32)
Calcium: 8.3 mg/dL — ABNORMAL LOW (ref 8.4–10.5)
Chloride: 108 mEq/L (ref 96–112)
Chloride: 113 mEq/L — ABNORMAL HIGH (ref 96–112)
Chloride: 115 mEq/L — ABNORMAL HIGH (ref 96–112)
Creatinine, Ser: 1.15 mg/dL (ref 0.4–1.2)
Creatinine, Ser: 1.49 mg/dL — ABNORMAL HIGH (ref 0.4–1.2)
GFR calc Af Amer: 55 mL/min — ABNORMAL LOW (ref >60)
GFR calc non Af Amer: 23 mL/min — ABNORMAL LOW (ref >60)
Glucose, Bld: 110 mg/dL — ABNORMAL HIGH (ref 70–99)
Glucose, Bld: 119 mg/dL — ABNORMAL HIGH (ref 70–99)
Potassium: 5 mEq/L (ref 3.5–5.1)
Sodium: 139 mEq/L (ref 135–145)

## 2010-09-06 LAB — DIFFERENTIAL
Basophils Absolute: 0 10*3/uL (ref 0.0–0.1)
Basophils Absolute: 0 10*3/uL (ref 0.0–0.1)
Basophils Relative: 0 % (ref 0–1)
Eosinophils Absolute: 0 10*3/uL (ref 0.0–0.7)
Eosinophils Absolute: 0 10*3/uL (ref 0.0–0.7)
Eosinophils Relative: 0 % (ref 0–5)
Eosinophils Relative: 0 % (ref 0–5)
Lymphocytes Relative: 4 % — ABNORMAL LOW (ref 12–46)
Monocytes Absolute: 0.6 10*3/uL (ref 0.1–1.0)
Neutro Abs: 4.6 10*3/uL (ref 1.7–7.7)

## 2010-09-08 ENCOUNTER — Other Ambulatory Visit (HOSPITAL_COMMUNITY): Payer: Self-pay | Admitting: Pulmonary Disease

## 2010-09-08 ENCOUNTER — Ambulatory Visit (HOSPITAL_COMMUNITY)
Admission: RE | Admit: 2010-09-08 | Discharge: 2010-09-08 | Disposition: A | Discharge status: Home / Self Care | Payer: Medicare Other | Source: Ambulatory Visit | Attending: Pulmonary Disease | Admitting: Pulmonary Disease

## 2010-09-08 DIAGNOSIS — M79609 Pain in unspecified limb: Secondary | ICD-10-CM | POA: Insufficient documentation

## 2010-09-08 DIAGNOSIS — M79604 Pain in right leg: Secondary | ICD-10-CM

## 2010-09-12 ENCOUNTER — Ambulatory Visit (INDEPENDENT_AMBULATORY_CARE_PROVIDER_SITE_OTHER): Payer: Medicare Other | Admitting: Orthopedic Surgery

## 2010-09-12 DIAGNOSIS — S43016A Anterior dislocation of unspecified humerus, initial encounter: Secondary | ICD-10-CM

## 2010-09-12 NOTE — Progress Notes (Signed)
Status post dislocation, RIGHT shoulder April 05, 2010 treated with physical therapy. After swelling.  Patient reports cellulitis, and UTI treated with antibiotics by primary care using Cipro and doxycycline seems to be doing well from that.  Complains of some anterior shoulder pain. She's regained most of her motion.  Her RIGHT shoulder has 120 of forward elevation in the scapular plane. She has 45 of external rotation with her arm at her side and there is no instability. The rotator cuff muscles are 4-5 out of 5 in terms of strength. Supraspinatus normal internal rotation 5 out of 5 and normal external rotation.

## 2010-09-12 NOTE — Patient Instructions (Signed)
Finish the antibiotics as prescribed by your doctors

## 2010-10-12 ENCOUNTER — Other Ambulatory Visit (HOSPITAL_COMMUNITY): Payer: Self-pay | Admitting: Pulmonary Disease

## 2010-10-12 ENCOUNTER — Ambulatory Visit (HOSPITAL_COMMUNITY)
Admission: RE | Admit: 2010-10-12 | Discharge: 2010-10-12 | Disposition: A | Discharge status: Home / Self Care | Payer: No Typology Code available for payment source | Source: Ambulatory Visit | Attending: Pulmonary Disease | Admitting: Pulmonary Disease

## 2010-10-12 DIAGNOSIS — S0003XA Contusion of scalp, initial encounter: Secondary | ICD-10-CM | POA: Insufficient documentation

## 2010-10-12 DIAGNOSIS — Z09 Encounter for follow-up examination after completed treatment for conditions other than malignant neoplasm: Secondary | ICD-10-CM | POA: Insufficient documentation

## 2010-10-31 NOTE — Discharge Summary (Signed)
NAME:  Cindy Schultz, Cindy Schultz              ACCOUNT NO.:  [id number]   MEDICAL RECORD NO.:  [id number]          PATIENT TYPE:  OBV   LOCATION:  A216                          FACILITY:  [facility]   PHYSICIAN:  Edward L. Luan Pulling, M.D.DATE OF BIRTH:  [birth year]   DATE OF ADMISSION:  06/20/2007  DATE OF DISCHARGE:  01/04/2009LH                               DISCHARGE SUMMARY   FINAL DISCHARGE DIAGNOSES:  1. Concussion.  2. Hypertension.  3. Recurrent bronchitis.  4. Osteoarthritis.  5. Glaucoma.  6. Osteoporosis.  7. Sleep apnea.  8. Status post total knee replacement.  9. Status post rectocele repair.  10.Status post foot surgery.  11.Status post hemorrhoidectomy.  12.Status post ovarian cyst surgery.  13.History of tracheostomy following croup.  14.Status post abdominal hernia repair.   HISTORY OF PRESENT ILLNESS:  Cindy Schultz is an 77 year old who came to  the emergency room after falling.  She had initially fallen on a wet  slippery driveway and hit the back of her head.  She got up, went into  the house and then had an episode of syncope.  When she came to the  emergency room, she had a CT which showed no acute abnormalities.  She  was somewhat confused and she was felt to have had a concussion and she  was brought to the emergency room for evaluation.  She was admitted  because of the problems with the concussion.   HOSPITAL COURSE:  She had neuro checks every 4 hours for about 36 hours.  The first 24 hours she was mildly still dizzy and not able to move  around very well.  She got much better and was able to be discharged  home on the 4th.   DISCHARGE MEDICATIONS:  She is going to resume her previous medications,  which are:  1. Avapro 150 mg daily.  2. Cosopt 1 drop to each eye b.i.d.  3. Xalatan 0.005% 1 drop in the right eye at bedtime.  4. Niacin 250 mg daily.  5. Omega-3 fish oil daily.  6. Caltrate twice a day.  7. Sustain Ultra 1 drop in each eye 3 times a day p.r.n.  8.  She was told that she could take over-the-counter ibuprofen or      naproxen for headache.   DISCHARGE INSTRUCTIONS:  She can use ice on her head.      Edward L. Luan Pulling, M.D.  Electronically Signed     ELH/MEDQ  D:  06/22/2007  T:  06/22/2007  Job:  [id number]

## 2010-10-31 NOTE — Group Therapy Note (Signed)
NAME:  Cindy Schultz, Cindy Schultz              ACCOUNT NO.:  [id number]   MEDICAL RECORD NO.:  [id number]          PATIENT TYPE:  OBV   LOCATION:  A216                          FACILITY:  [facility]   PHYSICIAN:  Edward L. Luan Pulling, M.D.DATE OF BIRTH:  1924-03-13   DATE OF PROCEDURE:  06/21/2007  DATE OF DISCHARGE:                                 PROGRESS NOTE   Ms. Lessard was admitted yesterday because of a fall and a contusion of  the head.  She says she is a little unsteady on her feet.  She has no  other new complaints.  She says that she does not feel like her balance  is good.  Temperature is 97.8, pulse 68, respirations 18, blood pressure  98/53, O2 sats 97% on room air.  Her chest is clear.  Her heart is  regular.  Her abdomen is soft.   ASSESSMENT:  She has had a head injury and she is better with that but  she has had syncope.  She has multiple other medical problems including  hypertension, sleep apnea, multiple surgeries.  My plan then, I am going  to keep her in the hospital another 24 hours and make sure that she is  okay.  We are going to check orthostatic vital signs as well.      Edward L. Luan Pulling, M.D.  Electronically Signed     ELH/MEDQ  D:  06/22/2007  T:  06/22/2007  Job:  [id number]

## 2010-10-31 NOTE — H&P (Signed)
NAME:  Cindy Schultz, Cindy Schultz              ACCOUNT NO.:  [id number]   MEDICAL RECORD NO.:  [id number]          PATIENT TYPE:  OBV   LOCATION:  A216                          FACILITY:  [facility]   PHYSICIAN:  Paula Compton. Willey Blade, MD       DATE OF BIRTH:  1923-07-20   DATE OF ADMISSION:  06/20/2007  DATE OF DISCHARGE:  LH                              HISTORY & PHYSICAL   HISTORY OF PRESENT ILLNESS:  This patient is an 77 year old white female  who presented to the emergency room after falling at home.  She  initially fell on her wet, slippery driveway and hit the back of her  head.  She got up and went in the house and called her friend, who came  over to her house.  She then experienced an episode of syncope.  She  subsequently vomited.  He she was evaluated in the emergency room, where  a CT scan of the brain revealed no acute abnormality.  She suffered  bruising to the back of her head, but her skin remained intact.   PAST MEDICAL HISTORY:  1. Osteoporosis.  2. Status post left total knee replacement.  3. Hypertension.  4. Sleep apnea.  5. Rectocele repair.  6. Foot surgery.  7. Bladder tack.  8. Hemorrhoidectomy.  9. Ovarian cyst surgery.  10.Croup as a child, requiring a tracheostomy.  11.Incidental appendectomy.  12.Abdominal hernia repair.   MEDICATIONS:  1. Avapro 150 mg daily.  2. Azithromycin 250 mg daily.  3. Cosopt 1 drop in each eye b.i.d.  4. Xalatan 0.005%, 1 drop in the right eye q.h.s.  5. Niacin 250 mg daily.  6. Omega 3 fish oil daily.  7. Caltrate b.i.d.  8. Norel DM 1 teaspoon q.4 p.r.n.  9. Sustain Ultra, 1 drop in each eye t.i.d. p.r.n.   ALLERGIES:  CODEINE AND HYDROCODONE.   SOCIAL HISTORY:  She does not smoke cigarettes or drink alcohol.  She  lives at home.   REVIEW OF SYSTEMS:  No headache, chest pain, abdominal pain, or hip  pain.   EXAM:  VITAL SIGNS:  Temperature 98.2, respirations 18, pulse 66, blood  pressures 154/78 and 172/81.  GENERAL:  Alert,  oriented and in no distress.  HEENT:  She has a bruise on the back of her head.  SKIN:  Is intact.  HEENT:  The eyes were unremarkable.  Extraocular movements are intact.  The face is symmetric.  Speech is intact.  Tongue is midline.  Oropharynx unremarkable.  NECK:  Supple with no adenopathy.  LUNGS:  Clear.  HEART:  Regular with no murmurs.  ABDOMEN:  Nontender.  No hepatosplenomegaly.  EXTREMITIES:  No edema.  NEURO:  No focal weakness.  Gait was not tested though.  SKIN:  Warm and dry.  Lymph nodes.  No cervical or supraclavicular  enlargement.   LABORATORY DATA:  Sodium 134, potassium 4.0, glucose 111, creatinine  1.1, white count 12.6, hemoglobin 13.4.  X-rays of her cervical spine  revealed no acute findings.  Chest x-ray revealed no infiltrate.   IMPRESSION:  1. Closed head injury.  She was quite groggy and sleepy throughout the      afternoon in the emergency room, but upon transfer to 2A,  she has      been alert and is now oriented and comfortable.  She will be      hospitalized for observation and serial neurologic checks.  Her      bruising on the back of the head will be treated with ice.  2. Hypertension.  Continue Avapro.  3. Recent bronchitis.  4. Continue azithromycin.      Paula Compton. Willey Blade, MD  Electronically Signed     ROF/MEDQ  D:  06/20/2007  T:  06/20/2007  Job:  [id number]

## 2010-10-31 NOTE — Group Therapy Note (Signed)
NAME:  Cindy Schultz, Cindy Schultz              ACCOUNT NO.:  [id number]   MEDICAL RECORD NO.:  [id number]          PATIENT TYPE:  OBV   LOCATION:  A216                          FACILITY:  [facility]   PHYSICIAN:  Edward L. Luan Pulling, M.D.DATE OF BIRTH:  21-Nov-1923   DATE OF PROCEDURE:  06/22/2007  DATE OF DISCHARGE:                                 PROGRESS NOTE   Ms. Galipeau says she is much better and ready to go home.  She has no  new complaints.   Her exam shows her temperature is 98.2, pulse 68, respirations 18, blood  pressure 98/52.  She had essentially no orthostatic blood pressure  changes.  In fact her standing systolic blood pressure is higher than  lying systolic blood pressure.  Neurologically she is intact and overall  I think she is doing well and I am going to plan to discharge her home.  Please see discharge summary for details.      Edward L. Luan Pulling, M.D.  Electronically Signed     ELH/MEDQ  D:  06/22/2007  T:  06/22/2007  Job:  [id number]

## 2010-11-03 NOTE — Op Note (Signed)
NAME:  KHOU, TUREAUD              ACCOUNT NO.:  [id number]   MEDICAL RECORD NO.:  [id number]          PATIENT TYPE:  OBV   LOCATION:  A339                          FACILITY:  [facility]   PHYSICIAN:  Jonnie Kind, M.D. DATE OF BIRTH:  12/13/1923   DATE OF PROCEDURE:  DATE OF DISCHARGE:                                 OPERATIVE REPORT   PREOPERATIVE DIAGNOSES:  Rectocele.   POSTOPERATIVE DIAGNOSES:  Rectocele/enterocele.   PROCEDURE:  Posterior repair (rectocele plus enterocele repair).   SURGEON:  Jonnie Kind, M.D.   ASSISTANT:  None.   ANESTHESIA:  Spinal.   COMPLICATIONS:  None.   FINDINGS:  Marked anterior rectocele with overlying enterocele sac. Right-  sided perirectal defect responding nicely to site specific repair.   DESCRIPTION OF PROCEDURE:  The patient was taken to the operating room,  prepped and draped for a vaginal procedure. Special care was taken regarding  her left knee due to the knee replacement. We simply placed the leg in low  lithotomy support and confirmed satisfactory safe positioning prior to  performing the spinal.   Once the patient was prepped and draped, we opened the peritoneum at the  vaginal entrance posteriorly splitting the vaginal mucosa and elevating it  off the underlying rectocele. Lateral dissection was performed with the  vaginal mucosa being somewhat fragile and tearing several times.  Nonetheless, dissection was eventually satisfactorily completed. A double  gloved index finger was placed in the rectum beneath the vaginal bib to  identify the extent of the rectocele. This allowed [place] to identify the  overlying enterocele. We were able to separate the enterocele sac from the  rectocele and push the enterocele cephalad. With the enterocele held out of  the way, we could identify satisfactory supportive tissues on each side that  could be pulled into the midline and attached together using a series of  horizontal mattress sutures.  It was noted that was lax tissue on the  patient's left side, and that the right side had very little tissue. We were  able to grasp some strong supportive tissues on the right side and use the  more mobile tissue on the left to be pulled over and upward to result in a  good rectal shelf being developed from this series of about 5 sutures of #0  Dexon. The upper most suture was taken out in order to ensure that we did  not restrict the vaginal apex. A good firm rectovaginal septum resulted.  There was a moderate amount of oozing from the area of the introitus during  this procedure but no specific pulsatile bleeding so this was treated with  completion of the  procedure, repositioning of the vaginal mucosa with a vaginal tape in place.  The Foley catheter was inserted, the patient went to the recovery room in  good condition. Sponge and needle counts were correct.   ADDENDUM:  The patient will be kept overnight.      Jonnie Kind, M.D.  Electronically Signed     JVF/MEDQ  D:  02/28/2006  T:  03/01/2006  Job:  [id number] 

## 2010-11-03 NOTE — H&P (Signed)
NAME:  Cindy Schultz              ACCOUNT NO.:  [id number]   MEDICAL RECORD NO.:  [id number]          PATIENT TYPE:  AMB   LOCATION:  DAY                           FACILITY:  [facility]   PHYSICIAN:  Carole Civil, M.D.DATE OF BIRTH:  06/14/1924   DATE OF ADMISSION:  DATE OF DISCHARGE:  LH                                HISTORY & PHYSICAL   CHIEF COMPLAINT:  Left knee pain.   HISTORY:  Cindy Schultz is a 77 year old female who had a left knee  arthroscopy done by Dr. Davene Costain in [place], [place].  At the time of  arthroscopy, it was noted that she had a partial medial meniscectomy, a  medial femoral condyle chondroplasty, but she did not improve enough to her  satisfaction.  She said she never quite made it back to normal knee  function.  She has pain and swelling of the left knee, which interferes  with activities of daily living.  Gardening and lawn care are very important  to her, and at this time she cannot do that.  After surgery she had  cortisone injections with fluid aspiration but did not make improvement.  Functionally, she cannot garden and her pain has become more severe and  despite bracing, she has still had significant pain.  She now presents for a  left total knee replacement.  We have reviewed the risks and benefits of the  procedure, and she agrees to proceed.   REVIEW OF SYSTEMS:  She has glaucoma, sinusitis, seasonal allergies, and  osteoporosis.  Her other systems which were reviewed were normal.   Her past family, social history reveal that she has had macular  degeneration, glaucoma.  She had an arthroscopy of the left knee.  She has  had a tracheotomy as a child.  She has a fallen uterus.  She had surgery  for an ovarian cyst.  She had a bladder tack, hemorrhoidectomy.  She had a  metatarsal osteotomy and removal of a Morton's neuroma.   CODEINE makes her sick and causes vomiting.   Her medications include calcium; vitamins A, E, and C, along with  zinc;  Lumigan drops; and Cosopt twice daily.   Family physician is Edward L. Luan Pulling, M.D.   She denies any smoking or alcohol use.   PHYSICAL EXAMINATION:  VITAL SIGNS:  Her weight is 138.  Her vital signs are  78 pulse and respiratory rate of 20.  Blood pressure will be recorded at the  time of preop.  GENERAL:  Appearance is normal in terms of development, nutrition, grooming,  and hygiene.  She is of small frame.  CARDIOVASCULAR:  Normal pulse and perfusion.  No swelling, tenderness, or  edema.  LYMPHATIC:  Her lymph nodes are benign.  SKIN:  Her skin is healed over the portal sites.  There are no rashes,  scars, or lesions.  NEUROPSYCHIATRIC:  Normal sensation in all three parameters, and she is  awake and alert with a pleasant mood, although she always seems a bit  nervous.  EXTREMITIES:  Left knee continues to show full range of motion.  Muscle  strength and tone are normal.  Her knee is stable.  She has tenderness over  the medial femoral condyle and medial joint line.  Meniscal signs are  negative.  Alignment is relatively normal.   Radiographs show osteoarthritis of the left knee.  Her knee MRI with  contrast showed previous meniscal tear, normal with no recurrence of the  tear.  There was medial cartilage thinning with large areas of absent  cartilage associated with marrow edema on both sides of the joint.  There  was some chondromalacia of the patella as well.   We have recommended a total knee replacement of the left knee.  The patient  has agreed to proceed with the procedure as stated.     [name]   SEH/MEDQ  D:  05/01/2004  T:  05/01/2004  Job:  [id number]   cc:   [facility]  Fax: [phone]

## 2010-11-03 NOTE — Op Note (Signed)
NAME:  Cindy Schultz, Cindy Schultz              ACCOUNT NO.:  [id number]   MEDICAL RECORD NO.:  [id number]          PATIENT TYPE:  AMB   LOCATION:  DAY                           FACILITY:  [facility]   PHYSICIAN:  Carole Civil, M.D.DATE OF BIRTH:  May 16, 1924   DATE OF PROCEDURE:  05/02/2004  DATE OF DISCHARGE:                                 OPERATIVE REPORT   PREOPERATIVE DIAGNOSIS:  Osteoarthritis, left knee.   POSTOPERATIVE DIAGNOSIS:  Osteoarthritis, left knee.   PROCEDURE:  Left total hip arthroplasty.   COMPONENTS USED:  Scorpio 7 femur, 5 tibia, 5 patella, and 10 mm flex  polyethylene insert.  We did a posterior-stabilized knee.   HISTORY:  This is a 77 year old female who had a left knee arthroscopy  approximately two years ago.  At the time it was noted that she had  degenerative arthritis.  A chondroplasty was done.  She also had a partial  medial meniscectomy.  She presented to me with complaints of left knee pain  and dealt with this for approximately one year until the pain became  unbearable.   She consented for a left total knee replacement with the understood risks  and benefits of the procedure.   There were no complications in the procedure.  Sponge and needle counts were  correct.  Specimens were sent from the osteotomies done to implant the  prosthesis.  A Hemovac drain was placed.  A pain pump catheter was placed.   SURGEON:  Carole Civil, M.D.  There were no assistants.   ANESTHESIA:  By spinal technique.   ESTIMATED BLOOD LOSS:  Estimated 0.   TOURNIQUET TIME:  1 hour 3 minutes.   The patient was identified in the preop holding area as Cindy Schultz.  A  review of the medical record was performed.  The history and physical and  consent were reviewed.  The left knee was marked as the surgical site by the  patient and the physician.  The patient was given a gram of Ancef and taken  to the operating room, where she had a spinal anesthetic.  She was  placed  supine on the operating table with a tourniquet on her left thigh.  We  prepped and draped the knee using sterile technique.  We took the mandatory  time-out as required to confirm the procedure, patient, and proper limb.  Once this was done, we exsanguinated the limb with a six-inch Esmarch,  elevated the tourniquet to 300 mmHg.   A straight incision was measured centered over the patella and extended  proximally and distally.  This incision was taken down to the extensor  mechanism and a medial arthrotomy was performed.  The proximal tibia was  exposed by everting the patella and elevating the medial soft tissue sleeve,  including the superficial and deep medial collateral ligament to account for  the varus deformity.  The tibial guide was set for a 0 degree slope and a 10  mm cut from the good lateral side, and a saw was used to remove the  proximal tibial bone.  We sized the tibia  to a size 5.   We took a 3/8 inch drill bit and entered the distal femur into the canal and  decompressed it with a footed guidewire with irrigation and suction.  We set  the tibial cut for a 10 mm cut and resected 10 mm of bone.  We sized the  femur to a size 9 and pinned for a 9 but cut for a 7.  We then made the box  cut and placed our trials.  The trial components fit very well.  No further  releases were needed.  We cut the patella from a 24 to a 14 and placed the  three peg holes for the patellar button and trialled it, and it fit well.  We irrigated the knee and cemented the prosthesis in place.  Excess cement  was removed.  The cement was allowed to cure.  The knee was irrigated.  A  Hemovac drain was placed.  Interrupted Bralon suture was used to close the  capsule and extensor mechanism.  Vicryl 2-0 and 0 Vicryl were used to close  the skin over a pain pump catheter.  Staples were used to complete the  closure.  Sterile bandages and a CryoCuff were placed, and the patient was  taken to the  recovery room in stable condition.   Postop plan:  Routine total knee protocol.     Cindy Schultz   SEH/MEDQ  D:  05/02/2004  T:  05/02/2004  Job:  [id number]

## 2010-11-03 NOTE — H&P (Signed)
NAME:  Cindy Schultz, Cindy Schultz              ACCOUNT NO.:  [id number]   MEDICAL RECORD NO.:  [id number]          PATIENT TYPE:  AMB   LOCATION:  DAY                           FACILITY:  [facility]   PHYSICIAN:  Jonnie Kind, M.D. DATE OF BIRTH:  1924-04-19   DATE OF ADMISSION:  DATE OF DISCHARGE:  LH                                HISTORY & PHYSICAL   ADMISSION DIAGNOSIS:  Rectocele, symptomatic.   HISTORY:  This 77 year old female was admitted for posterior repair.  Teela  has been seen in our office and referred from Dr. Gareth Eagle office and from  Dr. Sinda Du for evaluation of symptomatic rectocele.  She also had a  history of abnormal Pap.   The first abnormal Pap was a mild CIN-I on biopsy performed August 15, 2005, by Dr. Isaiah Blakes.  Subsequent LEEP conization of the cervix was negative  for abnormalities.  This was felt to represent a sufficiently large specimen  that we do not believe abnormalities have been missed.  Her Pap smear has  been repeated and it is completely normal.  She will simply get 2 additional  Pap smears on an every 6 month basis to ensure that this abnormality has  been eliminated.   Currently she is experiencing symptomatic rectocele complaints.  She has  difficulty with defecation.  She has to splint with her hand in order to  effectively evacuate the bowel.  We examined her, and she has a significant  rectocele present.  Given that the uterine support is good, the cervix is  giving [place] normal Pap smears, again our plan is to simply perform the  posterior repair and leave the uterus in situ.   PAST MEDICAL HISTORY:  Positive for macular degeneration and glaucoma.  She  sees the doctors at [facility], Dr. Kendall Flack.  She has a history of a  tracheotomy in 1932.  She has a uterine suspension in 1949.  She had ovarian  cystectomy in 1956.  She had a hemorrhoidectomy and bladder tack in 1953.  She had a Morton's neuroma of both feet in 1992 and 1994.  She  had a cyst  removed from the throat many years later and additional bladder tack in her  early 36s.  She has a history of a skin cancer on her chest excised recently  and subsequently negative.  This was a well-differentiated superficial  squamous cell carcinoma of the skin over her sternum and this with negative  skin margins.  Arthroscopy and left knee replacement, Dr. [name], and  hemorrhoidectomy, Dr. Romona Curls.   ALLERGIES:  None known.   PHYSICAL EXAMINATION:  GENERAL:  An alert, generally healthy-appearing  elderly Caucasian female who appears her stated age.  She is alert and  oriented x3.  HEENT:  Pupils are equal and conjugate gaze present.  NECK:  Supple, normal thyroid.  CHEST:  Clear to auscultation.  BREASTS:  Breast soreness after recent hormone therapy but no abnormalities.  Axillae negative for abnormalities.  CARDIAC:  Regular rate and rhythm.  LUNGS:  Clear to auscultation.  ABDOMEN:  Moderately relaxed.  PELVIC:  External genitalia normal.  A multiparous, elderly female.  Vaginal  exam shows rectocele present, bulging from greater than 90 degrees.  Cervix  is well-healed, smooth, with recent Pap smear class I.  Uterus nontender,  mobile, adequately supported.  Adnexa without masses.   IMPRESSION:  Symptomatic rectocele.   PLAN:  Admit for repair with bowel prep today as part of the preop.      Jonnie Kind, M.D.  Electronically Signed     JVF/MEDQ  D:  02/27/2006  T:  02/27/2006  Job:  [id number]   cc:   Percell Miller L. Luan Pulling, M.D.  Fax: [facility]  Fax: [phone]

## 2010-11-03 NOTE — Op Note (Signed)
NAME:  Cindy Schultz, Cindy Schultz              ACCOUNT NO.:  [id number]   MEDICAL RECORD NO.:  [id number]          PATIENT TYPE:  OBV   LOCATION:  [id number]                          FACILITY:  [facility]   PHYSICIAN:  Jonnie Kind, M.D. DATE OF BIRTH:  12/28/1923   DATE OF PROCEDURE:  03/01/2006  DATE OF DISCHARGE:                                 OPERATIVE REPORT   PREOPERATIVE DIAGNOSIS:  Postoperative hematoma.   POSTOPERATIVE DIAGNOSIS:  Postoperative hematoma.   OPERATION/PROCEDURE:  Evacuation of postoperative hematoma.   SURGEON:  Jonnie Kind, M.D.   INDICATIONS:  A 77 year old female seen yesterday.  Underwent posterior  repair and through the night has had persistent oozing with clinically  apparent hematoma on the internal exam.  She has had persistent nausea and  vomiting and rectal pressure.  Evacuation of hematoma indicated.   DESCRIPTION OF PROCEDURE:  The patient was taken to the operating room,  prepped and draped for repeat of the vaginal procedure with low lithotomy  leg support.  Cervix was not visible but midline on palpation.  The prior  closure of the posterior repair was opened and the posterior vaginal skin  flap elevated.  There was good tissue perfusion.  There was active oozing  coming from the lower portions of the posterior repair.  This was not  pulsatile arterial but appeared to be persistent heavy venous bleeding.  The  upper portions of the posterior repair had filled a large clot, estimated 6  cm transverse x 4 cm anterior and posterior.  This was suctioned out  carefully and cautiously until the entire posterior repair defect could be  identified and found to be empty of significant clots.  The lower portions  of the posterior repair were inspected.  There were two large venous  bleeding areas on the left side just above three of the stitches that made  up the posterior repair.  These were reinforced with an additional three  sutures of transversely oriented  vertical mattress sutures which resulted in  dramatically improved hemostasis.  The third one was left tagged long to  maintain orientation.  The vaginal mucosa was then sewn together using  running 2-0 chromic.  We sat and watched for sufficient time to be convinced  that hemostasis was under control both before and after the skin itself was  closed.  Vaginal packing was reinserted using Betadine-soaked gauze.  The  patient went to the recovery room in good condition.  Estimated blood loss  for the procedure was 100 mL with a generous amount of clots removed.   ADDENDUM:  During the postoperative time the patient had low oxygen  saturation in the 80s on room air and required oxygen supplementation.  There was no suspicion for dyspnea and no chest pain or anything to suggest  blood clots.  The patient will be kept overnight at least.  Possible  discharge September 15 or September 16.      Jonnie Kind, M.D.  Electronically Signed     JVF/MEDQ  D:  03/01/2006  T:  03/03/2006  Job:  [id number]   cc:  Edward L. Luan Pulling, M.D.  Fax: [phone]

## 2010-11-03 NOTE — Procedures (Signed)
NAME:  Cindy Schultz, Cindy Schultz            ACCOUNT NO.:  [id number]   MEDICAL RECORD NO.:  [id number]          PATIENT TYPE:  OUT   LOCATION:  SLEEP LAB                     FACILITY:  [facility]   PHYSICIAN:  Danton Sewer, M.D. [facility] DATE OF BIRTH:  Nov 02, 1923   DATE OF ADMISSION:  01/10/2004  DATE OF DISCHARGE:  01/10/2004                              NOCTURNAL POLYSOMNOGRAM   REFERRING PHYSICIAN:  Dr. Sinda Du.   INDICATION FOR STUDY:  Hypersomnia with sleep apnea.   SLEEP ARCHITECTURE:  The patient had a total sleep time of 361 minutes with  decreased slow-wave sleep and fairly normal REM.  She had a sleep onset  latency that was normal at 7 minutes and prolonged REM latency at 160  minutes.   IMPRESSION:  1. Very mild obstructive sleep apnea hypopnea syndrome with O2 desaturation     to 85%.  The patient had a respiratory disturbance index of 7-1/2 events     per hour, and therefore did not meet split night protocol.  Events were     not positional nor were they primarily REM related.  2. Loud snoring throughout the study.  3. No clinically significant cardiac arrhythmias.  4. Very large numbers of leg jerks with significant sleep disruption.     Clinical correlation is suggested.  Does the patient have a history     consistent with a restless leg syndrome?                                   ______________________________                                Danton Sewer, M.D. [facility]     KC/MEDQ  D:  01/13/2004 16:39:29  T:  01/13/2004 20:15:11  Job:  [id number]

## 2010-11-03 NOTE — Discharge Summary (Signed)
NAME:  Cindy Schultz, Cindy Schultz              ACCOUNT NO.:  [id number]   MEDICAL RECORD NO.:  [id number]          PATIENT TYPE:  INP   LOCATION:  [id number]                          FACILITY:  [facility]   PHYSICIAN:  Carole Civil, M.D.DATE OF BIRTH:  02/20/1924   DATE OF ADMISSION:  05/02/2004  DATE OF DISCHARGE:  11/18/2005LH                                 DISCHARGE SUMMARY   ADMISSION DIAGNOSIS:  Osteoarthritis, left knee.   DISCHARGE DIAGNOSIS:  Osteoarthritis, left knee.   PROCEDURE:  Left total knee replacement.   SURGEON:  Carole Civil, M.D.   IMPLANTS USED:  Scorpio posterior cruciate substituting total knee  replacement with flex insert.   BRIEF HISTORY:  This 77 year old female had a left knee arthroscopy by Dr.  Amado Coe of [place], [place].  She had a medial meniscectomy and at  the time was noted to have severe arthritis of the knee.  She presented to  me complaining of continued knee pain.  We treated her for approximately one  year before the pain became disabling and she presented for a left total  knee replacement.   HOSPITAL COURSE:  On the 15th of November, 2005 the patient underwent an  uncomplicated left total knee replacement under spinal anesthetic.  Her  surgery went well without complication.   POSTOPERATIVE COURSE:  She was noted to have hemoglobin of 8.4 on the 18th,  potassium was 3.4, range of motion was -14 to 84 degrees, pain level was I.   DISCHARGE MEDICATIONS:  1.  Vicodin 5/500 one tablet every four hours as needed for pain.  2.  Lovenox 30 mg twice daily - Continue until discharged from      rehabilitation.  3.  Feosol 45 mg caplet twice daily - Continue for one month.  4.  Colace 100 mg twice daily.  5.  Avapro 150 mg daily.  6.  Zocor 40 mg daily.  7.  Cosopt one drop twice daily each eye.  8.  Lumigan one drop 0.03% solution every night each eye.   Staples should come out on May 12, 2004.   Follow-up visit should be June 01, 2004.   DISPOSITION:  To [place].   CONDITION AT DISCHARGE:  Stable, ambulating approximately 8 feet with a  walker and standby assist.   PHYSICAL THERAPIST:  1.  Full weightbearing.  2.  Advance range of motion as tolerated.  3.  Concentrate on extension exercises, passive and active.  4.  Advance CPM 10 degrees per day starting at 70 degrees and advancing      until maximum flexion.     [name]   SEH/MEDQ  D:  05/05/2004  T:  05/05/2004  Job:  [id number]

## 2010-11-03 NOTE — Discharge Summary (Signed)
NAMEMarland Schultz  Cindy Schultz, [name]              ACCOUNT NO.:  [id number]   MEDICAL RECORD NO.:  [id number]          PATIENT TYPE:  OBV   LOCATION:  [id number]                          FACILITY:  [facility]   PHYSICIAN:  Jonnie Kind, M.D. DATE OF BIRTH:  02/07/1924   DATE OF ADMISSION:  02/28/2006  DATE OF DISCHARGE:  09/16/2007LH                                 DISCHARGE SUMMARY   ADMITTING DIAGNOSES:  Symptomatic rectocele.   DISCHARGE DIAGNOSES:  Symptomatic rectocele, postoperative hematoma and  posterior repair, probable sleep apnea.   PROCEDURES:  February 28, 2006, rectocele  repair, enterocele repair.  March 01, 2006, evacuation of hematoma and over-sewing of posterior  repair.   DISCHARGE MEDICATIONS:  1. MiraLax 17 grams p.o. daily.  2. Metamucil q.o.d.  3. Visionary vitamins daily.  4. Niacin daily.  5. Avapro daily.  6. Aspirin to be restarted in a.m.   HOSPITAL SUMMARY:  This 77 year old female with symptomatic posterior repair  with longstanding history of constipation made worse by large rectocele  resulting in intermittent difficulty with bowel movements was admitted at  this time for posterior repair.  See HPI for details.   HOSPITAL COURSE:  The patient was admitted, having stopped her aspirin 2  days prior to procedure.  She was admitted with laboratory data including a  hemoglobin of 12, hematocrit 36.  Admission blood sugar was 157 which was a  non-fasting blood sugar, with patient having had a bowel prep the night  before.  She had an enema that a.m.   The patient was taken to the OR where posterior was performed with  enterocele repair.  Vaginal packing was in place.  Estimated blood loss was  within normal limits for this type of surgery even though the patient had  only recently stopped the aspirin.  The patient had an increased amount of  perineal blood during the hours postoperatively and developed some rectal  pressure later that evening.  Inspection that  evening showed that she was  developing a hematoma in the upper aspects of the repair and continued  oozing at the lower aspects of the repair despite a modest vaginal pack in  place.  She was scheduled to return to the OR the following a.m.  She was  taken to the OR with postoperative day 1 hemoglobin 10.2, hematocrit 29.6,  taken to the OR where a generous amount of blood clots were removed, then an  area on the left vaginal side wall that appeared to have continuous venous  oozing was over-sewn and the bleeding stopped.  It should be noted that  intraoperatively, we had injected with vasoconstrictor agent for the first  procedure but did not do so for the second procedure.   The patient's second night was noted for some obtundation.  The patient's  second postoperative night was notable for respiratory rate as low as 4-6  respirations per minute with intermittent long pauses in her breathing.  On  further questioning, she states she has had 2 sleep apnea studies in the  past.  She does not use  a C-PAP at the current  time.  The morning of  Saturday, postoperative day 2, hospital day 2, she was much more alert.  Hemoglobin was 9.8, hematocrit 28.6, identical to hemoglobin taken the night  before.  She proceeded to remain stable for an additional 24 hours, alert,  active, and was discharged on March 03, 2006, afebrile, eating regular  food, alert and oriented x3.  She will be followed up.  She has supports by  family members and friends at home, and will follow up in 2 weeks in our  office or earlier problems.  She will continue her stool softener and  restart her aspirin later this week.      Jonnie Kind, M.D.  Electronically Signed     JVF/MEDQ  D:  03/03/2006  T:  03/04/2006  Job:  [id number]

## 2010-12-22 ENCOUNTER — Ambulatory Visit (HOSPITAL_COMMUNITY)
Admission: RE | Admit: 2010-12-22 | Discharge: 2010-12-22 | Disposition: A | Discharge status: Home / Self Care | Payer: Medicare Other | Source: Ambulatory Visit | Attending: Orthopedic Surgery | Admitting: Orthopedic Surgery

## 2010-12-22 ENCOUNTER — Ambulatory Visit (HOSPITAL_COMMUNITY): Payer: Medicare Other | Admitting: Physical Therapy

## 2010-12-22 DIAGNOSIS — R269 Unspecified abnormalities of gait and mobility: Secondary | ICD-10-CM | POA: Insufficient documentation

## 2010-12-22 DIAGNOSIS — Z9181 History of falling: Secondary | ICD-10-CM | POA: Insufficient documentation

## 2010-12-22 DIAGNOSIS — IMO0001 Reserved for inherently not codable concepts without codable children: Secondary | ICD-10-CM | POA: Insufficient documentation

## 2010-12-22 DIAGNOSIS — M6281 Muscle weakness (generalized): Secondary | ICD-10-CM | POA: Insufficient documentation

## 2010-12-27 ENCOUNTER — Ambulatory Visit (HOSPITAL_COMMUNITY)
Admission: RE | Admit: 2010-12-27 | Discharge: 2010-12-27 | Disposition: A | Discharge status: Home / Self Care | Payer: Medicare Other | Source: Ambulatory Visit | Attending: Pulmonary Disease | Admitting: Pulmonary Disease

## 2010-12-27 DIAGNOSIS — Z9181 History of falling: Secondary | ICD-10-CM | POA: Insufficient documentation

## 2010-12-27 DIAGNOSIS — M25569 Pain in unspecified knee: Secondary | ICD-10-CM | POA: Insufficient documentation

## 2010-12-27 DIAGNOSIS — M25559 Pain in unspecified hip: Secondary | ICD-10-CM | POA: Insufficient documentation

## 2010-12-27 DIAGNOSIS — M545 Low back pain, unspecified: Secondary | ICD-10-CM | POA: Insufficient documentation

## 2010-12-27 NOTE — Progress Notes (Signed)
Physical Therapy Treatment Patient Name: Cindy Schultz [id number] Date: 12/27/2010  Time in:  2:04pm  Time out:  2:46pm Visits #:  2 of 8 Charges:  Physical performance test 20 mins; therex 12 minutes  SUBJECTIVES:  Pt reports she feels better in the morning and LBP worsens as day goes on.  Currently 5/10 pain.  OBJECTIVE:  DGI performed with score of 12/24 obtained. Exercise/Treatments:  Pt. Completed 10 reps of supine bridge and bilateral SLR.  10 reps each of hip abduction.  Ankle pumps and ROM also performed.  Goals PT Short Term Goals Short Term Goal 1: PT will be independent with HEP to maximize safety Long Term Goal 1 Progress: Progressing toward goal Short Term Goal 2: Pt will improve LE power to allow patient to come from sit to stand without having to use her hands. Long Term Goal 2 Progress: Progressing toward goal Short Term Goal 3: Berg Balance score to be improved by 5 points Long Term Goal 3 Progress: Progressing toward goal PT Long Term Goals Long Term Goal 1: Pt will improve LE power by demonstrating 5 sit to stands in 18 seconds Long Term Goal 1 Progress: Not met Long Term Goal 2: Pt will increase LE strength to [facility] in order to ascend and descend stairs with 1 handrail with reciprocal pattern for improved safety. Long Term Goal 2 Progress: Not met Long Term Goal 3: Pt will improve her TUG score to 13.5 seconds in order to ambulate in the community without the need of an AD. Long Term Goal 3 Progress: Not met Long Term Goal 4: Pt will increase her Berg Balance score by 9 points from baseline to allow patient to ambulate safely without an AD. Long Term Goal 4 Progress: Not met End of Session Patient Active Problem List  Diagnoses  . HIP PAIN  . KNEE PAIN  . LOW BACK PAIN  . ANSERINE BURSITIS, LEFT  . LEG PAIN, BILATERAL  . DISLOCATION CLOSED SHOULDER NEC  . TOTAL KNEE FOLLOW-UP  . Personal history of fall   PT - End of Session Activity Tolerance: Patient  tolerated treatment well (pain with Right SLR only) General Behavior During Session: [facility] for tasks performed Cognition: [facility] for tasks performed PT Assessment and Plan Clinical Impression Statement: Pt. with gait instability and bilateral LE weakness.  Rehab Potential: Good PT Frequency: Min 2X/week PT Duration: 4 weeks PT Treatment/Interventions: Stair training;Gait training;Balance training;Therapeutic exercise (Dynamic Gait Index performed) PT Plan: Progress independence with amb, begin SLS, sit to stands and step ups to progress toward goals.   Mare Ferrari, Roseanna Koplin B 12/27/2010, 3:15 PM

## 2010-12-27 NOTE — Patient Instructions (Signed)
Pt instructed to continue HEP as given at initial evalutation.

## 2011-01-01 ENCOUNTER — Ambulatory Visit (HOSPITAL_COMMUNITY)
Admission: RE | Admit: 2011-01-01 | Discharge: 2011-01-01 | Disposition: A | Discharge status: Home / Self Care | Payer: Medicare Other | Source: Ambulatory Visit | Attending: Pulmonary Disease | Admitting: Pulmonary Disease

## 2011-01-01 DIAGNOSIS — Z9181 History of falling: Secondary | ICD-10-CM

## 2011-01-01 NOTE — Progress Notes (Signed)
Physical Therapy Treatment Patient Name: Cindy Schultz [id number] Date: 01/01/2011  Time in:  9:40 Time out: 10:12 Visits #: 3 of 8 Charges: therex 30 minutes  HPI: Symptoms/Limitations Symptoms: Pt. reports compliance with her HEP.  States her insurance is becoming unaffordable and may have to discontinue therapy soon.  Reports her pain is about the same. Pain Assessment Currently in Pain?: Yes Pain Score:   5 Pain Orientation: Right;Lower Pain Type: Chronic pain Pain Onset: More than a month ago Pain Frequency: Intermittent Multiple Pain Sites: No  Precautions/Restrictions:  None noted    Mobility (including Balance)  Aerobic/gait:  Treadmill 4 minutes at 1.36mph  Standing:  Heelraises: 10X, Toeraises 10X, step-ups bilaterally with 4 inch step 10X, SLS R: 8sec, L: 4 sec.  Seated:  Sit to stands 5X, no UE's from large mat  Supine:  SLR, Schultz 10X each, Bridge 10X  Sidelying:  Schultz Hip abd 10X each     Posture/Postural Control Posture/Postural Control: No significant limitations   Goals PT Short Term Goals Short Term Goal 1 Progress: Met Short Term Goal 2 Progress: Partly met Short Term Goal 3 Progress: Not met PT Long Term Goals Long Term Goal 1 Progress: Not met Long Term Goal 2 Progress: Not met Long Term Goal 3 Progress: Partly met Long Term Goal 4 Progress: Not met End of Session Patient Active Problem List  Diagnoses  . HIP PAIN  . KNEE PAIN  . LOW BACK PAIN  . ANSERINE BURSITIS, LEFT  . LEG PAIN, BILATERAL  . DISLOCATION CLOSED SHOULDER NEC  . TOTAL KNEE FOLLOW-UP  . Personal history of fall   PT - End of Session Activity Tolerance: Patient tolerated treatment well General Behavior During Session: WFL for tasks performed Cognition: [facility] for tasks performed PT Assessment and Plan Clinical Impression Statement: Decreased pain today with SLR's; added SLS's, step-ups with 4 inch, heel and toeraises Rehab Potential: Good PT Treatment/Interventions:  Therapeutic exercise PT Plan: Continue to increase strength; update HEP and insure independence if patient wishes to discharge self due to financial reasons.   Cindy Schultz, Cindy Schultz 01/01/2011, 10:25 AM

## 2011-01-02 ENCOUNTER — Telehealth: Payer: Self-pay | Admitting: Orthopedic Surgery

## 2011-01-02 NOTE — Telephone Encounter (Signed)
The  Swelling is from the stockings since they stop at the knee

## 2011-01-02 NOTE — Telephone Encounter (Signed)
Advised the patient of doctor's reply °

## 2011-01-02 NOTE — Telephone Encounter (Signed)
Cindy Schultz says her vascular doctor, Dr. Buren Kos at [facility], has ordered her to wear compression hose for the swelling in her ankles. Since starting to wear them, about 2 months now, her left knee (had left TKA 2005) has been swelling.  Said there is no knee pain, just swelling. Scheduled for 7 mo xr of left TKA 04/17/11.  She is concerned about the swelling and wanted to know if you need to see her before the October appointment to check the knee, or do you have other advice? Her # [phone]

## 2011-01-03 ENCOUNTER — Ambulatory Visit (HOSPITAL_COMMUNITY)
Admission: RE | Admit: 2011-01-03 | Discharge: 2011-01-03 | Disposition: A | Discharge status: Home / Self Care | Payer: Medicare Other | Source: Ambulatory Visit | Attending: Pulmonary Disease | Admitting: Pulmonary Disease

## 2011-01-03 DIAGNOSIS — R269 Unspecified abnormalities of gait and mobility: Secondary | ICD-10-CM | POA: Insufficient documentation

## 2011-01-03 DIAGNOSIS — Z9181 History of falling: Secondary | ICD-10-CM | POA: Insufficient documentation

## 2011-01-03 DIAGNOSIS — M545 Low back pain: Secondary | ICD-10-CM | POA: Insufficient documentation

## 2011-01-03 NOTE — Progress Notes (Addendum)
Physical Therapy Treatment Patient Name: Cindy Schultz [id number] Date: 01/03/2011 Time in: 9:04  Time out: 952 Visits #: 4 of 8  Charges: there-ex 19 minutes, gait: 25 minutes  HPI: Symptoms/Limitations Symptoms: "I'm doing okay today.  I don't have any pain, I am just stiff this early in the morning" Pain Assessment Pain Score: 0-No pain Pain Location: Back Pain Frequency: Intermittent Multiple Pain Sites: No   Mobility (including Balance) Ambulation/Gait Ambulation/Gait: Yes Ambulation/Gait Assistance: 7: Independent Gait Pattern: Decreased trunk rotation - decreased pelvic rotation.   Exercise/Treatments Aerobic/gait: Treadmill 5 minutes at 1.15mph  - manual cueing for pelvic rotation.  Standing:  Heelraises: 2x10,  Toeraises 10X,  SLS 3x30 sec BLE  Tandem Walk 1 RT Retro Walk 1 RT Hay Bails 10 x BLE Walking with head turns Gait Training w/canes 6 RT Gait training w/ cueing for UE motion 5 RT Seated: Sit to stands 5X, no UE's support 25.4 sec.  5 STS with focus on descending control - uncontrolled descent last 25% of sit.  Supine:  SLR, BLE 2x10  Bridge 10X  Sidelying:  BLE Hip abd 2x10 each  Goals PT Short Term Goals Short Term Goal 1 Progress: Met Short Term Goal 2 Progress: Met Short Term Goal 3 Progress: Progressing toward goal PT Long Term Goals Long Term Goal 1 Progress: Not met Long Term Goal 2 Progress: Not met Long Term Goal 3 Progress: Not met Long Term Goal 4 Progress: Not met End of Session Patient Active Problem List  Diagnoses  . HIP PAIN  . KNEE PAIN  . LOW BACK PAIN  . ANSERINE BURSITIS, LEFT  . LEG PAIN, BILATERAL  . DISLOCATION CLOSED SHOULDER NEC  . TOTAL KNEE FOLLOW-UP  . Personal history of fall  . Abnormality of gait   PT - End of Session Activity Tolerance: Patient tolerated treatment well PT Assessment and Plan Clinical Impression Statement: Pt was able to tolerate all new gait training activities today with intermittent  min A.  Requires mutimodal cueing for pelvic/UE rotation and posture with ambulation.  PT Plan: Cont to improve balance and strength.    Tanina Barb 01/03/2011, 9:53 AM

## 2011-01-08 ENCOUNTER — Ambulatory Visit (HOSPITAL_COMMUNITY)
Admission: RE | Admit: 2011-01-08 | Discharge: 2011-01-08 | Disposition: A | Discharge status: Home / Self Care | Payer: Medicare Other | Source: Ambulatory Visit | Attending: Pulmonary Disease | Admitting: Pulmonary Disease

## 2011-01-08 DIAGNOSIS — Z9181 History of falling: Secondary | ICD-10-CM

## 2011-01-08 NOTE — Progress Notes (Signed)
Physical Therapy Treatment Patient Name: Cindy Schultz [id number] Date: 01/08/2011 Initial Evaluation: 12/22/10  Re-eval Due: 01/22/11 Visits #: 5 of 8  Charges: there-ex 10 minutes, gait: 30 minutes Time [id number] HPI: Symptoms/Limitations Symptoms: "I'm doing about the same I guess.  I still have the pain from my arthritis in my hip and my back today." Pain Assessment Pain Score:   5 Pain Location: Back Pain Orientation: Right  Mobility (including Balance) Ambulation/Gait Ambulation/Gait: Yes Ambulation/Gait Assistance: 7: Independent Gait Pattern: Decreased trunk rotation  Balance Balance Assessed: Yes Tandem Stance R foot posterior 18 sec, L foot posterior: 5 sec  Exercise/Treatments  Aerobic/gait: Treadmill 5 minutes at 1.8mph - manual cueing for pelvic rotation.  Standing:   Heel Walk 2 RT  Toe Walk 2 RT  Side Stepping 2 RT BLE Heel raises: 2x10  Toe raises 2x10 SLS 3x30 sec BLE  Hay Bails 10 x BLE  - tactile cueing for pelvic rotation.  Walking with head turns 6 RT w/ head to R or to L Gait Training w/hand held A for pelvic rotation x 2 minutes  Gait training w/ cueing for UE motion, stride length, pelvic rotation x5 minutes Seated: Sit to stands 5X, no UE's support 25.4 sec.  Cybex Leg Extension 1 PL x10  Cybex leg curl 1.5 PL x10  Goals PT Short Term Goals Short Term Goal 1 Progress: Met Short Term Goal 2 Progress: Met Short Term Goal 3 Progress: Progressing toward goal PT Long Term Goals Long Term Goal 1 Progress: Not met Long Term Goal 2 Progress: Not met Long Term Goal 3 Progress: Not met Long Term Goal 4 Progress: Not met End of Session Patient Active Problem List  Diagnoses  . HIP PAIN  . KNEE PAIN  . LOW BACK PAIN  . ANSERINE BURSITIS, LEFT  . LEG PAIN, BILATERAL  . DISLOCATION CLOSED SHOULDER NEC  . TOTAL KNEE FOLLOW-UP  . Personal history of fall  . Abnormality of gait   PT - End of Session Activity Tolerance: Patient limited by  fatigue;Patient tolerated treatment well PT Assessment and Plan Clinical Impression Statement: Pt had notable improved pelvic rotation after gait training activities today.  Pt requires min-mod cueing for appropriate body mechanics.  Pt continues to be limited by fatigue but was able to complete all therapy without a rest break.  Pt has improved tandem stance time.   PT Plan: Cont to progress and improve LLE strength, balance and pelvic moboility.  Virgilia Quigg 01/08/2011, 1:05 PM

## 2011-01-10 ENCOUNTER — Ambulatory Visit (HOSPITAL_COMMUNITY): Payer: Medicare Other

## 2011-01-15 ENCOUNTER — Ambulatory Visit (HOSPITAL_COMMUNITY): Payer: Medicare Other | Admitting: Physical Therapy

## 2011-01-17 ENCOUNTER — Ambulatory Visit (HOSPITAL_COMMUNITY): Payer: Medicare Other | Admitting: Physical Therapy

## 2011-03-07 LAB — BASIC METABOLIC PANEL
BUN: 19
Calcium: 9.7
Creatinine, Ser: 1.11
GFR calc non Af Amer: 47 — ABNORMAL LOW
Potassium: 4

## 2011-03-07 LAB — DIFFERENTIAL
Basophils Absolute: 0
Lymphocytes Relative: 15
Lymphs Abs: 1.9
Neutro Abs: 9.9 — ABNORMAL HIGH
Neutrophils Relative %: 78 — ABNORMAL HIGH

## 2011-03-07 LAB — CBC
Platelets: 194
RDW: 13.2
WBC: 12.6 — ABNORMAL HIGH

## 2011-03-07 LAB — POCT CARDIAC MARKERS
Myoglobin, poc: 124
Operator id: 213721
Troponin i, poc: <0.05

## 2011-04-17 ENCOUNTER — Ambulatory Visit: Payer: PRIVATE HEALTH INSURANCE | Admitting: Orthopedic Surgery

## 2011-04-26 ENCOUNTER — Ambulatory Visit (INDEPENDENT_AMBULATORY_CARE_PROVIDER_SITE_OTHER): Payer: Medicare Other | Admitting: Orthopedic Surgery

## 2011-04-26 ENCOUNTER — Encounter: Payer: Self-pay | Admitting: Orthopedic Surgery

## 2011-04-26 VITALS — BP 130/70 | Ht 62.0 in | Wt 128.0 lb

## 2011-04-26 DIAGNOSIS — Z96659 Presence of unspecified artificial knee joint: Secondary | ICD-10-CM

## 2011-04-26 NOTE — Progress Notes (Signed)
November 2005 LEFT total knee arthroplasty with the [employer] Scorpio knee system  Chief complaint annual followup, only aggravates her. She stands in one place for a long time.  Annual f/u at 7 years   Separately identifiable. X-ray report. RIGHT knee replacement  Total knee replacement annual x-ray.  All 3 components are properly aligned. Overall knee alignment is normal. No signs of loosening.  Impression no complicating findings and his postoperative knee replacement film

## 2011-04-26 NOTE — Patient Instructions (Signed)
Activities as tolerated. 

## 2011-05-15 ENCOUNTER — Ambulatory Visit (HOSPITAL_COMMUNITY)
Admission: RE | Admit: 2011-05-15 | Discharge: 2011-05-15 | Disposition: A | Discharge status: Home / Self Care | Payer: Medicare Other | Source: Ambulatory Visit | Attending: Pulmonary Disease | Admitting: Pulmonary Disease

## 2011-05-15 ENCOUNTER — Other Ambulatory Visit (HOSPITAL_COMMUNITY): Payer: Self-pay | Admitting: Pulmonary Disease

## 2011-05-15 DIAGNOSIS — J40 Bronchitis, not specified as acute or chronic: Secondary | ICD-10-CM | POA: Insufficient documentation

## 2011-05-15 DIAGNOSIS — R059 Cough, unspecified: Secondary | ICD-10-CM | POA: Insufficient documentation

## 2011-05-15 DIAGNOSIS — R05 Cough: Secondary | ICD-10-CM | POA: Insufficient documentation

## 2011-05-29 ENCOUNTER — Encounter (HOSPITAL_COMMUNITY): Payer: Self-pay

## 2011-05-29 ENCOUNTER — Emergency Department (HOSPITAL_COMMUNITY): Payer: Medicare Other

## 2011-05-29 ENCOUNTER — Emergency Department (HOSPITAL_COMMUNITY)
Admission: EM | Admit: 2011-05-29 | Discharge: 2011-05-29 | Disposition: A | Discharge status: Home / Self Care | Payer: Medicare Other | Attending: Emergency Medicine | Admitting: Emergency Medicine

## 2011-05-29 DIAGNOSIS — Z79899 Other long term (current) drug therapy: Secondary | ICD-10-CM | POA: Insufficient documentation

## 2011-05-29 DIAGNOSIS — J320 Chronic maxillary sinusitis: Secondary | ICD-10-CM

## 2011-05-29 DIAGNOSIS — H409 Unspecified glaucoma: Secondary | ICD-10-CM | POA: Insufficient documentation

## 2011-05-29 DIAGNOSIS — Z7982 Long term (current) use of aspirin: Secondary | ICD-10-CM | POA: Insufficient documentation

## 2011-05-29 DIAGNOSIS — I10 Essential (primary) hypertension: Secondary | ICD-10-CM | POA: Insufficient documentation

## 2011-05-29 DIAGNOSIS — R51 Headache: Secondary | ICD-10-CM | POA: Insufficient documentation

## 2011-05-29 MED ORDER — ONDANSETRON HCL 4 MG/2ML IJ SOLN
4.0000 mg | Freq: Once | INTRAMUSCULAR | Status: AC
  Administered 2011-05-29: 4 mg via INTRAVENOUS
  Filled 2011-05-29: qty 2

## 2011-05-29 MED ORDER — SODIUM CHLORIDE 0.9 % IV BOLUS (SEPSIS)
500.0000 mL | Freq: Once | INTRAVENOUS | Status: AC
  Administered 2011-05-29: 500 mL via INTRAVENOUS

## 2011-05-29 MED ORDER — DIPHENHYDRAMINE HCL 50 MG/ML IJ SOLN
25.0000 mg | Freq: Once | INTRAMUSCULAR | Status: AC
  Administered 2011-05-29: 25 mg via INTRAVENOUS
  Filled 2011-05-29: qty 1

## 2011-05-29 MED ORDER — METOCLOPRAMIDE HCL 5 MG/ML IJ SOLN
10.0000 mg | Freq: Once | INTRAMUSCULAR | Status: AC
  Administered 2011-05-29: 10 mg via INTRAVENOUS
  Filled 2011-05-29: qty 2

## 2011-05-29 MED ORDER — ONDANSETRON HCL 4 MG PO TABS: 4.0000 mg | ORAL_TABLET | Freq: Four times a day (QID) | ORAL | Status: AC

## 2011-05-29 MED ORDER — NAPROXEN 500 MG PO TABS: 500.0000 mg | ORAL_TABLET | Freq: Two times a day (BID) | ORAL | Status: DC

## 2011-05-29 NOTE — ED Notes (Signed)
Pulse ox 02Sat at 96   Resting quietly
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
## 2011-05-29 NOTE — ED Notes (Signed)
Pt called family member to pick her up.  Pt waiting in waiting room for arrival.  nad noted.

## 2011-05-29 NOTE — ED Provider Notes (Signed)
History    This chart was scribed for Nat Christen, MD, MD by Rhae Lerner. The patient was seen in room APA10 and the patient's care was started at 9:55AM.   CSN: [id number] Arrival date & time: 05/29/2011  9:31 AM   First MD Initiated Contact with Patient 05/29/11 0940      Chief Complaint  Patient presents with  . Headache    (Consider location/radiation/quality/duration/timing/severity/associated sxs/prior treatment) The history is provided by the patient.   Cindy Schultz is a 77 y.o. female who presents to the Emergency Department complaining of intermttent sharp headache in left occipital pain and frontal pressure onset today (7 hrs ago). Pt reports the pain last a few seconds then it subsides for 5 seconds. Pt denies confusion. She is oriented to person, place and time. She states she was in [facility] over the summer and the pain is located in the same area that trauma occurred.  No neuro deficits or stiffness.  Been on antibiotic for sinusitis  Past Medical History  Diagnosis Date  . Macular degeneration   . Glaucoma   . Precancerous changes of the cervix     lesions  . Rectocele   . Diverticulitis   . Lumbar spine pain   . Pain in joints   . Hearing loss   . HTN (hypertension)     cholesterol    Past Surgical History  Procedure Date  . Salk   . Tracheotomy   . Fallen uterus   . Ovarian cyst surgery   . Bladder tac   . Hemmorrhoidectomy   . Met osteostomy   . Left total knee replacement 05/02/04    [name]  . Morton's nueroma   . Elevated metatarsal arch   . Rupture rt groin   . Appendix   . Rectocele repair   . Corneal erosion     cataracts     No family history on file.  History  Substance Use Topics  . Smoking status: Never Smoker   . Smokeless tobacco: Not on file  . Alcohol Use: No    OB History    Grav Para Term Preterm Abortions TAB SAB Ect Mult Living                  Review of Systems  All other systems reviewed and are  negative.   10 Systems reviewed and are negative for acute change except as noted in the HPI.  Allergies  Codeine; Hydrocodone; Promethazine hcl; and Tramadol hcl  Home Medications   Current Outpatient Rx  Name Route Sig Dispense Refill  . ASPIRIN 81 MG PO TABS Oral Take 81 mg by mouth daily.      Marland Kitchen CIPROFLOXACIN HCL 250 MG PO TABS Oral Take 250 mg by mouth 2 (two) times daily.      Marland Kitchen DOXYCYCLINE MONOHYDRATE 100 MG PO TABS Oral Take 100 mg by mouth 2 (two) times daily.      Marland Kitchen GABAPENTIN 100 MG PO CAPS Oral Take 100 mg by mouth at bedtime. 1 by mouth hs       . LATANOPROST 0.005 % OP SOLN  1 drop at bedtime.      Marland Kitchen PRESERVISION AREDS PO Oral Take by mouth.      Merri Brunette OP Ophthalmic Apply to eye.      Marland Kitchen PREDNISONE 5 MG PO TABS Oral Take 5 mg by mouth daily. 1 by mouth two times a day     . TOLTERODINE TARTRATE  4 MG PO CP24 Oral Take 4 mg by mouth daily.        BP 142/81  Pulse 75  Temp(Src) 97.5 F (36.4 C) (Oral)  Resp 20  Ht 5\' 2"  (1.575 m)  Wt 130 lb (58.968 kg)  BMI 23.78 kg/m2  SpO2 100%  Physical Exam  Nursing note and vitals reviewed. Constitutional: She is oriented to person, place, and time. She appears well-developed and well-nourished.  HENT:  Head: Normocephalic and atraumatic.  Eyes: Conjunctivae and EOM are normal. Pupils are equal, round, and reactive to light.  Neck: Normal range of motion. Neck supple.  Cardiovascular: Normal rate and regular rhythm.   Pulmonary/Chest: Effort normal and breath sounds normal.  Abdominal: Soft. Bowel sounds are normal.  Musculoskeletal: Normal range of motion.  Neurological: She is alert and oriented to person, place, and time.  Skin: Skin is warm and dry.  Psychiatric: She has a normal mood and affect.    ED Course  Procedures (including critical care time)  DIAGNOSTIC STUDIES: Oxygen Saturation is 100% on room air, normal by my interpretation.    COORDINATION OF CARE: 9:55 AM Pt will be given CT head  and pain medication  2:06 PM: recheck: Pt is feeling better. Dr discusses labs and treatment. Pt is ready for discharge.  Labs Reviewed - No data to display Ct Head Wo Contrast  05/29/2011  *RADIOLOGY REPORT*  Clinical Data: Left occipital headache.  CT HEAD WITHOUT CONTRAST  Technique:  Contiguous axial images were obtained from the base of the skull through the vertex without contrast.  Comparison: Head CT 04/05/2010.  Findings: The ventricles are normal.  No extra-axial fluid collections are seen.  The brainstem and cerebellum are unremarkable.  No acute intracranial findings such as infarction or hemorrhage.  No mass lesions.  The bony calvarium is intact. There is a fluid level in the left maxillary sinus suggesting acute sinusitis.  IMPRESSION: No acute intracranial findings or mass lesions.  No change since prior study. Acute left maxillary sinusitis.  Original Report Authenticated By: P. Kalman Jewels, M.D.     No diagnosis found.    MDM  CT head shows no acute findings.   feeling better after pain management. No neuro deficits at discharge I personally performed the services described in this documentation, which was scribed in my presence. The recorded information has been reviewed and considered.            Nat Christen, MD 06/03/11 [phone]

## 2011-06-10 ENCOUNTER — Emergency Department (HOSPITAL_COMMUNITY)
Admission: EM | Admit: 2011-06-10 | Discharge: 2011-06-10 | Disposition: A | Discharge status: Home / Self Care | Payer: Medicare Other | Attending: Emergency Medicine | Admitting: Emergency Medicine

## 2011-06-10 ENCOUNTER — Emergency Department (HOSPITAL_COMMUNITY): Payer: Medicare Other

## 2011-06-10 ENCOUNTER — Encounter (HOSPITAL_COMMUNITY): Payer: Self-pay | Admitting: *Deleted

## 2011-06-10 DIAGNOSIS — Z79899 Other long term (current) drug therapy: Secondary | ICD-10-CM | POA: Insufficient documentation

## 2011-06-10 DIAGNOSIS — J3489 Other specified disorders of nose and nasal sinuses: Secondary | ICD-10-CM | POA: Insufficient documentation

## 2011-06-10 DIAGNOSIS — J40 Bronchitis, not specified as acute or chronic: Secondary | ICD-10-CM | POA: Insufficient documentation

## 2011-06-10 DIAGNOSIS — R0602 Shortness of breath: Secondary | ICD-10-CM | POA: Insufficient documentation

## 2011-06-10 DIAGNOSIS — R059 Cough, unspecified: Secondary | ICD-10-CM | POA: Insufficient documentation

## 2011-06-10 DIAGNOSIS — I10 Essential (primary) hypertension: Secondary | ICD-10-CM | POA: Insufficient documentation

## 2011-06-10 DIAGNOSIS — R0989 Other specified symptoms and signs involving the circulatory and respiratory systems: Secondary | ICD-10-CM | POA: Insufficient documentation

## 2011-06-10 DIAGNOSIS — R05 Cough: Secondary | ICD-10-CM | POA: Insufficient documentation

## 2011-06-10 DIAGNOSIS — R079 Chest pain, unspecified: Secondary | ICD-10-CM | POA: Insufficient documentation

## 2011-06-10 MED ORDER — ALBUTEROL SULFATE HFA 108 (90 BASE) MCG/ACT IN AERS: 1.0000 | INHALATION_SPRAY | Freq: Four times a day (QID) | RESPIRATORY_TRACT | Status: DC | PRN

## 2011-06-10 MED ORDER — PREDNISONE 10 MG PO TABS: 20.0000 mg | ORAL_TABLET | Freq: Every day | ORAL | Status: DC

## 2011-06-10 NOTE — ED Notes (Signed)
Cough, sinus congestion, pain to back and ribs for several weeks.  Has been on 3 rounds of abx with no relief.  States last night became sob, unable to lay flat.  States has been around significant other who tested positive for flu x 3 days ago.

## 2011-06-10 NOTE — ED Provider Notes (Signed)
History   This chart was scribed for Cindy Jacobsen, MD by Carolyne Littles . The patient was seen in room APA05/APA05 and the patient's care was started at 10:50am.    CSN: [id number]  Arrival date & time 06/10/11  [id number]   First MD Initiated Contact with Patient 06/10/11 1042      Chief Complaint  Patient presents with  . chest congestion   . Cough    (Consider location/radiation/quality/duration/timing/severity/associated sxs/prior treatment) HPI Cindy Schultz is a 77 y.o. female who presents to the Emergency Department complaining of constant moderate SOB onset last night and persistent since with associated nasal congestion, non-productive cough, and chest soreness with coughing. Patient notes she has been experiencing cough, nasal congestion and chest soreness for the past several weeks and has been on 3 different courses of abx in relation to complaints. States she was recently evaluated by PCP-Hawkins for complaints and started on Ceftin. Reports SOB is aggravated with lying flat and relieved by nothing. Denies n/v/d, abdominal pain, swelling of lower extremities and chest pressure. Notes recent sick contact with boyfriend suffering from flu-like symptoms.  Past Medical History  Diagnosis Date  . Macular degeneration   . Glaucoma   . Precancerous changes of the cervix     lesions  . Rectocele   . Diverticulitis   . Lumbar spine pain   . Pain in joints   . Hearing loss   . HTN (hypertension)     cholesterol    Past Surgical History  Procedure Date  . Salk   . Tracheotomy   . Fallen uterus   . Ovarian cyst surgery   . Bladder tac   . Hemmorrhoidectomy   . Met osteostomy   . Left total knee replacement 05/02/04    [name]  . Morton's nueroma   . Elevated metatarsal arch   . Rupture rt groin   . Appendix   . Rectocele repair   . Corneal erosion     cataracts     No family history on file.  History  Substance Use Topics  . Smoking status: Never Smoker     . Smokeless tobacco: Not on file  . Alcohol Use: No    OB History    Grav Para Term Preterm Abortions TAB SAB Ect Mult Living                  Review of Systems A complete 10 system review of systems was obtained and is otherwise negative except as noted in the HPI and PMH.   Allergies  Codeine; Hydrocodone; Promethazine hcl; and Tramadol hcl  Home Medications   Current Outpatient Rx  Name Route Sig Dispense Refill  . ALLOPURINOL 300 MG PO TABS Oral Take 300 mg by mouth daily.      . ASPIRIN 81 MG PO TABS Oral Take 81 mg by mouth daily.      Marland Kitchen CALCIUM 600-200 MG-UNIT PO TABS Oral Take 1 tablet by mouth daily.      Marland Kitchen CEFUROXIME AXETIL 500 MG PO TABS Oral Take 500 mg by mouth 2 (two) times daily.      . COLESEVELAM HCL 625 MG PO TABS Oral Take 1,875 mg by mouth 2 (two) times daily with a meal.      . DORZOLAMIDE HCL-TIMOLOL MAL 22.3-6.8 MG/ML OP SOLN Both Eyes Place 1 drop into both eyes 2 (two) times daily.      Marland Kitchen FLUTICASONE PROPIONATE 50 MCG/ACT NA SUSP Nasal Place 2 sprays  into the nose daily.      Marland Kitchen LATANOPROST 0.005 % OP SOLN Right Eye Place 1 drop into the right eye at bedtime.     Marland Kitchen PRESERVISION AREDS PO Oral Take 2 tablets by mouth 2 (two) times daily.     Marland Kitchen NAPROXEN 500 MG PO TABS Oral Take 1 tablet (500 mg total) by mouth 2 (two) times daily. 30 tablet 0  . SYSTANE ULTRA OP Ophthalmic Apply 1 drop to eye 3 (three) times daily.     Marland Kitchen SOLIFENACIN SUCCINATE 5 MG PO TABS Oral Take 10 mg by mouth daily.      Marland Kitchen NIFEDIPINE ER 30 MG PO TB24 Oral Take 30 mg by mouth daily.        BP 147/77  Pulse 94  Temp(Src) 99.2 F (37.3 C) (Oral)  Resp 20  Ht 5' (1.524 m)  Wt 130 lb (58.968 kg)  BMI 25.39 kg/m2  SpO2 99%  Physical Exam  Nursing note and vitals reviewed. Constitutional: She is oriented to person, place, and time. She appears well-developed and well-nourished. No distress.  HENT:  Head: Normocephalic and atraumatic.  Mouth/Throat: Oropharynx is clear and moist.   Eyes: EOM are normal. Pupils are equal, round, and reactive to light.  Neck: Neck supple. No tracheal deviation present.  Cardiovascular: Normal rate.   Pulmonary/Chest: Effort normal. No respiratory distress.       Decreased air movement bilaterally   Abdominal: Soft. She exhibits no distension. There is no tenderness.  Musculoskeletal: Normal range of motion. She exhibits no edema.  Neurological: She is alert and oriented to person, place, and time. No sensory deficit.  Skin: Skin is warm and dry.  Psychiatric: She has a normal mood and affect. Her behavior is normal.    ED Course  Procedures (including critical care time)  DIAGNOSTIC STUDIES: Oxygen Saturation is 99% on room air, normal by my interpretation.    COORDINATION OF CARE:    Labs Reviewed - No data to display Dg Chest 1 View  06/10/2011  *RADIOLOGY REPORT*  Clinical Data: Cough  CHEST - 1 VIEW  Comparison: Chest radiograph 05/15/2011  Findings: Normal mediastinum and cardiac silhouette.  Normal pulmonary  vasculature.  No evidence of effusion, infiltrate, or pneumothorax.  No acute bony abnormality. Degenerative osteophytosis of the thoracic spine.  IMPRESSION: No acute cardiopulmonary process.  Original Report Authenticated By: Suzy Bouchard, M.D.     No diagnosis found.    MDM  Suspect the patient has some component of bronchitis to her current symptoms. Patient's Re: taking antibiotics and she was instructed to continue this. We'll add a short course of prednisone and give her an albuterol inhaler and she will followup with her doctor next week      I personally performed the services described in this documentation, which was scribed in my presence. The recorded information has been reviewed and considered.    Cindy Jacobsen, MD 06/10/11 [phone]

## 2011-06-10 NOTE — ED Notes (Signed)
Pt c/o nasal congestion and coughing. Pt states that she has not been able to cough anything up but her nasal drainage is yellow. Pt alert and oriented x 3. Skin warm and dry. Color pink. Pt has non productive cough.

## 2011-06-19 DIAGNOSIS — C801 Malignant (primary) neoplasm, unspecified: Secondary | ICD-10-CM

## 2011-06-19 HISTORY — DX: Malignant (primary) neoplasm, unspecified: C80.1

## 2011-06-22 DIAGNOSIS — M546 Pain in thoracic spine: Secondary | ICD-10-CM | POA: Diagnosis not present

## 2011-06-22 DIAGNOSIS — M5137 Other intervertebral disc degeneration, lumbosacral region: Secondary | ICD-10-CM | POA: Diagnosis not present

## 2011-06-22 DIAGNOSIS — M999 Biomechanical lesion, unspecified: Secondary | ICD-10-CM | POA: Diagnosis not present

## 2011-06-28 ENCOUNTER — Ambulatory Visit (INDEPENDENT_AMBULATORY_CARE_PROVIDER_SITE_OTHER): Payer: Medicare Other | Admitting: Otolaryngology

## 2011-06-28 DIAGNOSIS — H903 Sensorineural hearing loss, bilateral: Secondary | ICD-10-CM

## 2011-06-28 DIAGNOSIS — J0100 Acute maxillary sinusitis, unspecified: Secondary | ICD-10-CM | POA: Diagnosis not present

## 2011-06-28 DIAGNOSIS — H6980 Other specified disorders of Eustachian tube, unspecified ear: Secondary | ICD-10-CM

## 2011-07-20 DIAGNOSIS — M5137 Other intervertebral disc degeneration, lumbosacral region: Secondary | ICD-10-CM | POA: Diagnosis not present

## 2011-07-20 DIAGNOSIS — M999 Biomechanical lesion, unspecified: Secondary | ICD-10-CM | POA: Diagnosis not present

## 2011-07-20 DIAGNOSIS — M546 Pain in thoracic spine: Secondary | ICD-10-CM | POA: Diagnosis not present

## 2011-08-20 DIAGNOSIS — IMO0002 Reserved for concepts with insufficient information to code with codable children: Secondary | ICD-10-CM | POA: Diagnosis not present

## 2011-08-20 DIAGNOSIS — M999 Biomechanical lesion, unspecified: Secondary | ICD-10-CM | POA: Diagnosis not present

## 2011-08-20 DIAGNOSIS — M5137 Other intervertebral disc degeneration, lumbosacral region: Secondary | ICD-10-CM | POA: Diagnosis not present

## 2011-09-18 ENCOUNTER — Inpatient Hospital Stay (HOSPITAL_COMMUNITY)
Admission: EM | Admit: 2011-09-18 | Discharge: 2011-09-26 | DRG: 378 | Disposition: A | Discharge status: Skilled Nursing Facility | Payer: Medicare Other | Attending: Internal Medicine | Admitting: Internal Medicine

## 2011-09-18 DIAGNOSIS — I10 Essential (primary) hypertension: Secondary | ICD-10-CM

## 2011-09-18 DIAGNOSIS — E861 Hypovolemia: Secondary | ICD-10-CM | POA: Diagnosis present

## 2011-09-18 DIAGNOSIS — K922 Gastrointestinal hemorrhage, unspecified: Secondary | ICD-10-CM | POA: Diagnosis not present

## 2011-09-18 DIAGNOSIS — IMO0002 Reserved for concepts with insufficient information to code with codable children: Secondary | ICD-10-CM

## 2011-09-18 DIAGNOSIS — H409 Unspecified glaucoma: Secondary | ICD-10-CM | POA: Diagnosis present

## 2011-09-18 DIAGNOSIS — Z888 Allergy status to other drugs, medicaments and biological substances status: Secondary | ICD-10-CM

## 2011-09-18 DIAGNOSIS — K644 Residual hemorrhoidal skin tags: Secondary | ICD-10-CM | POA: Diagnosis present

## 2011-09-18 DIAGNOSIS — Z86718 Personal history of other venous thrombosis and embolism: Secondary | ICD-10-CM

## 2011-09-18 DIAGNOSIS — Z96659 Presence of unspecified artificial knee joint: Secondary | ICD-10-CM

## 2011-09-18 DIAGNOSIS — M109 Gout, unspecified: Secondary | ICD-10-CM | POA: Diagnosis present

## 2011-09-18 DIAGNOSIS — H9190 Unspecified hearing loss, unspecified ear: Secondary | ICD-10-CM | POA: Diagnosis present

## 2011-09-18 DIAGNOSIS — K5731 Diverticulosis of large intestine without perforation or abscess with bleeding: Secondary | ICD-10-CM | POA: Diagnosis not present

## 2011-09-18 DIAGNOSIS — M545 Low back pain, unspecified: Secondary | ICD-10-CM

## 2011-09-18 DIAGNOSIS — M25569 Pain in unspecified knee: Secondary | ICD-10-CM

## 2011-09-18 DIAGNOSIS — D126 Benign neoplasm of colon, unspecified: Secondary | ICD-10-CM | POA: Diagnosis present

## 2011-09-18 DIAGNOSIS — R269 Unspecified abnormalities of gait and mobility: Secondary | ICD-10-CM

## 2011-09-18 DIAGNOSIS — R58 Hemorrhage, not elsewhere classified: Secondary | ICD-10-CM | POA: Diagnosis not present

## 2011-09-18 DIAGNOSIS — Z79899 Other long term (current) drug therapy: Secondary | ICD-10-CM

## 2011-09-18 DIAGNOSIS — D62 Acute posthemorrhagic anemia: Secondary | ICD-10-CM

## 2011-09-18 DIAGNOSIS — M25559 Pain in unspecified hip: Secondary | ICD-10-CM

## 2011-09-18 DIAGNOSIS — S43086A Other dislocation of unspecified shoulder joint, initial encounter: Secondary | ICD-10-CM

## 2011-09-18 DIAGNOSIS — R55 Syncope and collapse: Secondary | ICD-10-CM

## 2011-09-18 DIAGNOSIS — M79609 Pain in unspecified limb: Secondary | ICD-10-CM

## 2011-09-18 DIAGNOSIS — Z9181 History of falling: Secondary | ICD-10-CM

## 2011-09-18 DIAGNOSIS — H3530 Unspecified macular degeneration: Secondary | ICD-10-CM | POA: Diagnosis present

## 2011-09-18 DIAGNOSIS — K648 Other hemorrhoids: Secondary | ICD-10-CM | POA: Diagnosis present

## 2011-09-18 LAB — DIFFERENTIAL
Basophils Relative: 0 % (ref 0–1)
Eosinophils Absolute: 0.2 10*3/uL (ref 0.0–0.7)
Eosinophils Relative: 2 % (ref 0–5)
Lymphs Abs: 3.6 10*3/uL (ref 0.7–4.0)
Monocytes Absolute: 0.9 10*3/uL (ref 0.1–1.0)
Monocytes Relative: 10 % (ref 3–12)
Neutrophils Relative %: 50 % (ref 43–77)

## 2011-09-18 LAB — CBC
Hemoglobin: 9.9 g/dL — ABNORMAL LOW (ref 12.0–15.0)
MCH: 33.3 pg (ref 26.0–34.0)
MCHC: 33.3 g/dL (ref 30.0–36.0)
MCV: 100 fL (ref 78.0–100.0)
RBC: 2.97 MIL/uL — ABNORMAL LOW (ref 3.87–5.11)

## 2011-09-18 LAB — POCT I-STAT, CHEM 8
BUN: 35 mg/dL — ABNORMAL HIGH (ref 6–23)
Chloride: 108 mEq/L (ref 96–112)
HCT: 29 % — ABNORMAL LOW (ref 36.0–46.0)
Potassium: 4.1 mEq/L (ref 3.5–5.1)

## 2011-09-18 LAB — COMPREHENSIVE METABOLIC PANEL
Albumin: 3.1 g/dL — ABNORMAL LOW (ref 3.5–5.2)
Alkaline Phosphatase: 64 U/L (ref 39–117)
BUN: 35 mg/dL — ABNORMAL HIGH (ref 6–23)
Calcium: 8.7 mg/dL (ref 8.4–10.5)
Creatinine, Ser: 1.24 mg/dL — ABNORMAL HIGH (ref 0.50–1.10)
GFR calc Af Amer: 45 mL/min — ABNORMAL LOW (ref >90)
Glucose, Bld: 119 mg/dL — ABNORMAL HIGH (ref 70–99)
Total Protein: 5.6 g/dL — ABNORMAL LOW (ref 6.0–8.3)

## 2011-09-18 LAB — TROPONIN I: Troponin I: <0.3 ng/mL (ref <0.30)

## 2011-09-18 LAB — PROTIME-INR
INR: 1.11 (ref 0.00–1.49)
Prothrombin Time: 14.5 seconds (ref 11.6–15.2)

## 2011-09-18 MED ORDER — SODIUM CHLORIDE 0.9 % IV BOLUS (SEPSIS)
500.0000 mL | Freq: Once | INTRAVENOUS | Status: AC
  Administered 2011-09-18: 500 mL via INTRAVENOUS

## 2011-09-18 MED ORDER — SODIUM CHLORIDE 0.9 % IV SOLN
INTRAVENOUS | Status: DC
  Administered 2011-09-19: via INTRAVENOUS

## 2011-09-18 NOTE — ED Provider Notes (Addendum)
History     CSN: [id number]  Arrival date & time 09/18/11  2243   First MD Initiated Contact with Patient 09/18/11 2237      Chief Complaint  Patient presents with  . GI Bleeding    (Consider location/radiation/quality/duration/timing/severity/associated sxs/prior treatment) The history is provided by the patient and the EMS personnel.   patient noticed some blood in her stool at 10 AM today. This evening around 9 PM she felt abdominal cramping and had a large amount of blood in her bowel movement. She states she later passed out. She has red blood draining from her now. No abdominal pain. She's had a little lightheadedness. No other bleeding.  Past Medical History  Diagnosis Date  . Macular degeneration   . Glaucoma   . Precancerous changes of the cervix     lesions  . Rectocele   . Diverticulitis   . Lumbar spine pain   . Pain in joints   . Hearing loss   . HTN (hypertension)     cholesterol    Past Surgical History  Procedure Date  . Salk   . Tracheotomy   . Fallen uterus   . Ovarian cyst surgery   . Bladder tac   . Hemmorrhoidectomy   . Met osteostomy   . Left total knee replacement 05/02/04    [name]  . Morton's nueroma   . Elevated metatarsal arch   . Rupture rt groin   . Appendix   . Rectocele repair   . Corneal erosion     cataracts     No family history on file.  History  Substance Use Topics  . Smoking status: Never Smoker   . Smokeless tobacco: Not on file  . Alcohol Use: No    OB History    Grav Para Term Preterm Abortions TAB SAB Ect Mult Living                  Review of Systems  Constitutional: Negative for activity change and appetite change.  HENT: Negative for neck stiffness.   Eyes: Negative for pain.  Respiratory: Negative for chest tightness and shortness of breath.   Cardiovascular: Negative for chest pain and leg swelling.  Gastrointestinal: Positive for blood in stool. Negative for nausea, vomiting, abdominal pain and  diarrhea.  Genitourinary: Negative for flank pain.  Musculoskeletal: Negative for back pain.  Skin: Negative for rash.  Neurological: Positive for syncope. Negative for weakness, numbness and headaches.  Psychiatric/Behavioral: Negative for behavioral problems.    Allergies  Codeine; Hydrocodone; Promethazine hcl; and Tramadol hcl  Home Medications   Current Outpatient Rx  Name Route Sig Dispense Refill  . ALLOPURINOL 300 MG PO TABS Oral Take 300 mg by mouth daily.      . ASPIRIN 81 MG PO TABS Oral Take 81 mg by mouth daily.      Marland Kitchen CALCIUM 600-200 MG-UNIT PO TABS Oral Take 1 tablet by mouth daily.      . COLESEVELAM HCL 625 MG PO TABS Oral Take 1,875 mg by mouth 2 (two) times daily with a meal.      . DORZOLAMIDE HCL-TIMOLOL MAL 22.3-6.8 MG/ML OP SOLN Both Eyes Place 1 drop into both eyes 2 (two) times daily.      Marland Kitchen FLUTICASONE PROPIONATE 50 MCG/ACT NA SUSP Nasal Place 2 sprays into the nose daily.      Marland Kitchen LATANOPROST 0.005 % OP SOLN Right Eye Place 1 drop into the right eye at bedtime.     Marland Kitchen  PRESERVISION AREDS PO Oral Take 2 tablets by mouth 2 (two) times daily.     Marland Kitchen NAPROXEN 500 MG PO TABS Oral Take 1 tablet (500 mg total) by mouth 2 (two) times daily. 30 tablet 0  . NIFEDIPINE ER 30 MG PO TB24 Oral Take 30 mg by mouth daily.      . OXYBUTYNIN CHLORIDE ER 5 MG PO TB24 Oral Take 5 mg by mouth daily.    Carren Rang ULTRA OP Ophthalmic Apply 1 drop to eye 3 (three) times daily.       BP 129/65  Pulse 71  Temp(Src) 97.4 F (36.3 C) (Oral)  Resp 19  Ht 5\' 2"  (1.575 m)  Wt 127 lb 8 oz (57.834 kg)  BMI 23.32 kg/m2  SpO2 100%  Physical Exam  Nursing note and vitals reviewed. Constitutional: She is oriented to person, place, and time. She appears well-developed and well-nourished.  HENT:  Head: Normocephalic and atraumatic.  Eyes: EOM are normal. Pupils are equal, round, and reactive to light.  Neck: Normal range of motion. Neck supple.  Cardiovascular: Normal rate, regular  rhythm and normal heart sounds.   No murmur heard. Pulmonary/Chest: Effort normal and breath sounds normal. No respiratory distress. She has no wheezes. She has no rales.  Abdominal: Soft. Bowel sounds are normal. She exhibits no distension. There is no tenderness. There is no rebound and no guarding.       Frank rectal bleeding.  Musculoskeletal: Normal range of motion.  Neurological: She is alert and oriented to person, place, and time. No cranial nerve deficit.  Skin: Skin is warm and dry.  Psychiatric: She has a normal mood and affect. Her speech is normal.   skin tear to her right hand. Nothing to suture  ED Course  Procedures (including critical care time)  Labs Reviewed  CBC - Abnormal; Notable for the following:    RBC 2.97 (*)    Hemoglobin 9.9 (*)    HCT 29.7 (*)    All other components within normal limits  COMPREHENSIVE METABOLIC PANEL - Abnormal; Notable for the following:    Glucose, Bld 119 (*)    BUN 35 (*)    Creatinine, Ser 1.24 (*)    Total Protein 5.6 (*)    Albumin 3.1 (*)    Total Bilirubin 0.1 (*)    GFR calc non Af Amer 38 (*)    GFR calc Af Amer 45 (*)    All other components within normal limits  POCT I-STAT, CHEM 8 - Abnormal; Notable for the following:    BUN 35 (*)    Creatinine, Ser 1.60 (*)    Glucose, Bld 117 (*)    Hemoglobin 9.9 (*)    HCT 29.0 (*)    All other components within normal limits  DIFFERENTIAL  PROTIME-INR  APTT  TYPE AND SCREEN  TROPONIN I  HEMOGLOBIN AND HEMATOCRIT, BLOOD   No results found.   1. GI bleed      MDM  Patient with rectal bleeding. History of diverticulosis. Laboratories hemoglobin of 9.9. This is a decrease from her baseline. She will be admitted to medicine. She did fall, but no apparent injury.   Jasper Riling. Alvino Chapel, MD 09/18/11 [place] Alvino Chapel, MD 09/19/11 0001

## 2011-09-18 NOTE — ED Notes (Signed)
The patient states she first noticed blood in her stool at 1000 today.  This evening "after 9 p.m." she stated she was feeling cramps.  When she tried to have a bowel movement, she noticed a larger amount of blood.  Moments later, she "passed out" in her kitchen.  Patient presents with large amounts of clotted, bright red blood all over her dress, her shoes, and the EMS backboard.

## 2011-09-19 ENCOUNTER — Other Ambulatory Visit: Payer: Self-pay

## 2011-09-19 ENCOUNTER — Encounter (HOSPITAL_COMMUNITY): Payer: Self-pay | Admitting: Internal Medicine

## 2011-09-19 DIAGNOSIS — K5732 Diverticulitis of large intestine without perforation or abscess without bleeding: Secondary | ICD-10-CM | POA: Diagnosis not present

## 2011-09-19 DIAGNOSIS — Z96659 Presence of unspecified artificial knee joint: Secondary | ICD-10-CM | POA: Diagnosis not present

## 2011-09-19 DIAGNOSIS — E861 Hypovolemia: Secondary | ICD-10-CM | POA: Diagnosis present

## 2011-09-19 DIAGNOSIS — K922 Gastrointestinal hemorrhage, unspecified: Secondary | ICD-10-CM | POA: Diagnosis present

## 2011-09-19 DIAGNOSIS — K644 Residual hemorrhoidal skin tags: Secondary | ICD-10-CM | POA: Diagnosis present

## 2011-09-19 DIAGNOSIS — R55 Syncope and collapse: Secondary | ICD-10-CM | POA: Diagnosis not present

## 2011-09-19 DIAGNOSIS — K625 Hemorrhage of anus and rectum: Secondary | ICD-10-CM | POA: Diagnosis not present

## 2011-09-19 DIAGNOSIS — K5730 Diverticulosis of large intestine without perforation or abscess without bleeding: Secondary | ICD-10-CM | POA: Diagnosis not present

## 2011-09-19 DIAGNOSIS — M109 Gout, unspecified: Secondary | ICD-10-CM | POA: Diagnosis present

## 2011-09-19 DIAGNOSIS — H3530 Unspecified macular degeneration: Secondary | ICD-10-CM | POA: Diagnosis not present

## 2011-09-19 DIAGNOSIS — D126 Benign neoplasm of colon, unspecified: Secondary | ICD-10-CM | POA: Diagnosis not present

## 2011-09-19 DIAGNOSIS — H9190 Unspecified hearing loss, unspecified ear: Secondary | ICD-10-CM | POA: Diagnosis present

## 2011-09-19 DIAGNOSIS — K5731 Diverticulosis of large intestine without perforation or abscess with bleeding: Secondary | ICD-10-CM | POA: Diagnosis not present

## 2011-09-19 DIAGNOSIS — I10 Essential (primary) hypertension: Secondary | ICD-10-CM | POA: Diagnosis present

## 2011-09-19 DIAGNOSIS — Z86718 Personal history of other venous thrombosis and embolism: Secondary | ICD-10-CM | POA: Diagnosis not present

## 2011-09-19 DIAGNOSIS — D62 Acute posthemorrhagic anemia: Secondary | ICD-10-CM | POA: Diagnosis present

## 2011-09-19 DIAGNOSIS — M6281 Muscle weakness (generalized): Secondary | ICD-10-CM | POA: Diagnosis not present

## 2011-09-19 DIAGNOSIS — R112 Nausea with vomiting, unspecified: Secondary | ICD-10-CM | POA: Diagnosis not present

## 2011-09-19 DIAGNOSIS — K648 Other hemorrhoids: Secondary | ICD-10-CM | POA: Diagnosis present

## 2011-09-19 DIAGNOSIS — Z5189 Encounter for other specified aftercare: Secondary | ICD-10-CM | POA: Diagnosis not present

## 2011-09-19 DIAGNOSIS — R262 Difficulty in walking, not elsewhere classified: Secondary | ICD-10-CM | POA: Diagnosis not present

## 2011-09-19 DIAGNOSIS — K649 Unspecified hemorrhoids: Secondary | ICD-10-CM | POA: Diagnosis not present

## 2011-09-19 DIAGNOSIS — Z79899 Other long term (current) drug therapy: Secondary | ICD-10-CM | POA: Diagnosis not present

## 2011-09-19 DIAGNOSIS — Z888 Allergy status to other drugs, medicaments and biological substances status: Secondary | ICD-10-CM | POA: Diagnosis not present

## 2011-09-19 DIAGNOSIS — H409 Unspecified glaucoma: Secondary | ICD-10-CM | POA: Diagnosis present

## 2011-09-19 LAB — COMPREHENSIVE METABOLIC PANEL
ALT: 9 U/L (ref 0–35)
AST: 14 U/L (ref 0–37)
Albumin: 2.2 g/dL — ABNORMAL LOW (ref 3.5–5.2)
Alkaline Phosphatase: 52 U/L (ref 39–117)
CO2: 23 mEq/L (ref 19–32)
Chloride: 112 mEq/L (ref 96–112)
Creatinine, Ser: 0.99 mg/dL (ref 0.50–1.10)
GFR calc non Af Amer: 51 mL/min — ABNORMAL LOW (ref >90)
Potassium: 4.2 mEq/L (ref 3.5–5.1)
Sodium: 141 mEq/L (ref 135–145)
Total Bilirubin: 0.1 mg/dL — ABNORMAL LOW (ref 0.3–1.2)

## 2011-09-19 LAB — IRON AND TIBC
Iron: 48 ug/dL (ref 42–135)
Saturation Ratios: 27 % (ref 20–55)
UIBC: 128 ug/dL (ref 125–400)

## 2011-09-19 LAB — CBC
HCT: 20.1 % — ABNORMAL LOW (ref 36.0–46.0)
HCT: 28.3 % — ABNORMAL LOW (ref 36.0–46.0)
Hemoglobin: 6.5 g/dL — CL (ref 12.0–15.0)
Hemoglobin: 9.5 g/dL — ABNORMAL LOW (ref 12.0–15.0)
MCH: 33.3 pg (ref 26.0–34.0)
MCH: 33.2 pg (ref 26.0–34.0)
MCHC: 32.8 g/dL (ref 30.0–36.0)
MCHC: 33.6 g/dL (ref 30.0–36.0)
Platelets: 186 10*3/uL (ref 150–400)
Platelets: 119 10*3/uL — ABNORMAL LOW (ref 150–400)
RBC: 2.61 MIL/uL — ABNORMAL LOW (ref 3.87–5.11)
RBC: 1.96 MIL/uL — ABNORMAL LOW (ref 3.87–5.11)
RDW: 15.1 % (ref 11.5–15.5)
RDW: 15.2 % (ref 11.5–15.5)
RDW: 20.3 % — ABNORMAL HIGH (ref 11.5–15.5)
WBC: 12.7 10*3/uL — ABNORMAL HIGH (ref 4.0–10.5)
WBC: 7.6 10*3/uL (ref 4.0–10.5)
WBC: 7.8 10*3/uL (ref 4.0–10.5)
WBC: 10.4 10*3/uL (ref 4.0–10.5)

## 2011-09-19 LAB — LACTIC ACID, PLASMA: Lactic Acid, Venous: 1.4 mmol/L (ref 0.5–2.2)

## 2011-09-19 LAB — GLUCOSE, CAPILLARY
Glucose-Capillary: 78 mg/dL (ref 70–99)
Glucose-Capillary: 85 mg/dL (ref 70–99)

## 2011-09-19 LAB — MRSA PCR SCREENING: MRSA by PCR: NEGATIVE

## 2011-09-19 LAB — VITAMIN B12: Vitamin B-12: 620 pg/mL (ref 211–911)

## 2011-09-19 LAB — RETICULOCYTES: RBC.: 1.82 MIL/uL — ABNORMAL LOW (ref 3.87–5.11)

## 2011-09-19 LAB — FOLATE: Folate: >20 ng/mL

## 2011-09-19 LAB — HEMOGLOBIN AND HEMATOCRIT, BLOOD: Hemoglobin: 9.8 g/dL — ABNORMAL LOW (ref 12.0–15.0)

## 2011-09-19 MED ORDER — ACETAMINOPHEN 650 MG RE SUPP: 650.0000 mg | Freq: Four times a day (QID) | RECTAL | Status: DC | PRN

## 2011-09-19 MED ORDER — SODIUM CHLORIDE 0.9 % IJ SOLN
3.0000 mL | Freq: Two times a day (BID) | INTRAMUSCULAR | Status: DC
  Administered 2011-09-19 – 2011-09-21 (×5): 3 mL via INTRAVENOUS

## 2011-09-19 MED ORDER — FLUTICASONE PROPIONATE 50 MCG/ACT NA SUSP
2.0000 | Freq: Every day | NASAL | Status: DC
  Administered 2011-09-19 – 2011-09-26 (×6): 2 via NASAL
  Filled 2011-09-19 (×2): qty 16

## 2011-09-19 MED ORDER — ALPRAZOLAM 0.25 MG PO TABS
0.2500 mg | ORAL_TABLET | Freq: Once | ORAL | Status: AC
  Administered 2011-09-19: 0.25 mg via ORAL
  Filled 2011-09-19: qty 1

## 2011-09-19 MED ORDER — ONDANSETRON HCL 4 MG PO TABS: 4.0000 mg | ORAL_TABLET | Freq: Four times a day (QID) | ORAL | Status: DC | PRN

## 2011-09-19 MED ORDER — ACETAMINOPHEN 325 MG PO TABS: 650.0000 mg | ORAL_TABLET | Freq: Four times a day (QID) | ORAL | Status: DC | PRN

## 2011-09-19 MED ORDER — LATANOPROST 0.005 % OP SOLN
1.0000 [drp] | Freq: Every day | OPHTHALMIC | Status: DC
  Administered 2011-09-19 – 2011-09-25 (×7): 1 [drp] via OPHTHALMIC
  Filled 2011-09-19: qty 2.5

## 2011-09-19 MED ORDER — ONDANSETRON HCL 4 MG/2ML IJ SOLN: 4.0000 mg | Freq: Four times a day (QID) | INTRAMUSCULAR | Status: DC | PRN

## 2011-09-19 MED ORDER — PEG 3350-KCL-NA BICARB-NACL 420 G PO SOLR
4000.0000 mL | Freq: Once | ORAL | Status: AC
  Administered 2011-09-20: 4000 mL via ORAL
  Filled 2011-09-19: qty 4000

## 2011-09-19 MED ORDER — DORZOLAMIDE HCL-TIMOLOL MAL 2-0.5 % OP SOLN
1.0000 [drp] | Freq: Two times a day (BID) | OPHTHALMIC | Status: DC
  Administered 2011-09-19 – 2011-09-26 (×15): 1 [drp] via OPHTHALMIC
  Filled 2011-09-19: qty 10

## 2011-09-19 MED ORDER — SODIUM CHLORIDE 0.9 % IV SOLN
INTRAVENOUS | Status: DC
  Administered 2011-09-19: 03:00:00 via INTRAVENOUS
  Administered 2011-09-20 – 2011-09-21 (×2): 1000 mL via INTRAVENOUS
  Administered 2011-09-22: 20 mL/h via INTRAVENOUS
  Administered 2011-09-22: 13:00:00 via INTRAVENOUS

## 2011-09-19 MED ORDER — PANTOPRAZOLE SODIUM 40 MG IV SOLR
40.0000 mg | Freq: Two times a day (BID) | INTRAVENOUS | Status: DC
  Administered 2011-09-19 – 2011-09-22 (×8): 40 mg via INTRAVENOUS
  Filled 2011-09-19 (×11): qty 40

## 2011-09-19 NOTE — ED Notes (Signed)
Report called to floor.  Awaiting orders from admitting MD to take patient to floor.

## 2011-09-19 NOTE — ED Notes (Signed)
Called and gave report to [place].

## 2011-09-19 NOTE — Consult Note (Signed)
Reason for Consult: Rectal bleeding. Referring Physician: [facility]  Cindy Schultz is an 78 y.o. female.   HPI: Patient is a 78 year-old woman admitted for multiple bloody bowel movements since yesterday morning 10 AM- about 10-12, almost 1 every hour. She denies any abdominal pain or nausea or vomiting. Patient also lost her consciousness for a brief amount before coming to ER- when EMS was called. Patient reports history of chronic constipation and diverticulosis; she has had problems with constipation since she was 78 years old. But did not have any previous GI bleeding.Last reported colonoscopy many years before- during which she was found to have diverticula and polyps. Patient also denies chest pain, shortness of breath, headache, vision changes or dizziness at present.   Past Medical History  Diagnosis Date  . Macular degeneration   . Glaucoma   . Precancerous changes of the cervix     lesions  . Rectocele   . Diverticulitis   . Lumbar spine pain   . Pain in joints   . Hearing loss   . HTN (hypertension)     cholesterol    Past Surgical History  Procedure Date  . Salk   . Tracheotomy   . Fallen uterus   . Ovarian cyst surgery   . Bladder tac   . Hemmorrhoidectomy   . Met osteostomy   . Left total knee replacement 05/02/04    [name]  . Morton's nueroma   . Elevated metatarsal arch   . Rupture rt groin   . Appendix   . Rectocele repair   . Corneal erosion     cataracts     History reviewed. No pertinent family history.  Social History:  reports that she has never smoked. She does not have any smokeless tobacco history on file. She reports that she does not drink alcohol or use illicit drugs.  Allergies:  Allergies  Allergen Reactions  . Codeine Nausea And Vomiting  . Hydrocodone Other (See Comments)    Unknown Reaction   . Promethazine Hcl     REACTION: deathly sick  . Tramadol Hcl Other (See Comments)    Motion Sickness   Medications: I  have reviewed the patient's current medications.  Results for orders placed during the hospital encounter of 09/18/11 (from the past 48 hour(s))  CBC     Status: Abnormal   Collection Time   09/18/11 10:50 PM      Component Value Range Comment   WBC 9.4  4.0 - 10.5 (K/uL)    RBC 2.97 (*) 3.87 - 5.11 (MIL/uL)    Hemoglobin 9.9 (*) 12.0 - 15.0 (g/dL)    HCT 29.7 (*) 36.0 - 46.0 (%)    MCV 100.0  78.0 - 100.0 (fL)    MCH 33.3  26.0 - 34.0 (pg)    MCHC 33.3  30.0 - 36.0 (g/dL)    RDW 15.1  11.5 - 15.5 (%)    Platelets 165  150 - 400 (K/uL)   DIFFERENTIAL     Status: Normal   Collection Time   09/18/11 10:50 PM      Component Value Range Comment   Neutrophils Relative 50  43 - 77 (%)    Neutro Abs 4.7  1.7 - 7.7 (K/uL)    Lymphocytes Relative 38  12 - 46 (%)    Lymphs Abs 3.6  0.7 - 4.0 (K/uL)    Monocytes Relative 10  3 - 12 (%)    Monocytes Absolute 0.9  0.1 -  1.0 (K/uL)    Eosinophils Relative 2  0 - 5 (%)    Eosinophils Absolute 0.2  0.0 - 0.7 (K/uL)    Basophils Relative 0  0 - 1 (%)    Basophils Absolute 0.0  0.0 - 0.1 (K/uL)   COMPREHENSIVE METABOLIC PANEL     Status: Abnormal   Collection Time   09/18/11 10:50 PM      Component Value Range Comment   Sodium 141  135 - 145 (mEq/L)    Potassium 4.2  3.5 - 5.1 (mEq/L)    Chloride 106  96 - 112 (mEq/L)    CO2 26  19 - 32 (mEq/L)    Glucose, Bld 119 (*) 70 - 99 (mg/dL)    BUN 35 (*) 6 - 23 (mg/dL)    Creatinine, Ser 1.24 (*) 0.50 - 1.10 (mg/dL)    Calcium 8.7  8.4 - 10.5 (mg/dL)    Total Protein 5.6 (*) 6.0 - 8.3 (g/dL)    Albumin 3.1 (*) 3.5 - 5.2 (g/dL)    AST 18  0 - 37 (U/L)    ALT 12  0 - 35 (U/L)    Alkaline Phosphatase 64  39 - 117 (U/L)    Total Bilirubin 0.1 (*) 0.3 - 1.2 (mg/dL)    GFR calc non Af Amer 38 (*) >90 (mL/min)    GFR calc Af Amer 45 (*) >90 (mL/min)   PROTIME-INR     Status: Normal   Collection Time   09/18/11 10:50 PM      Component Value Range Comment   Prothrombin Time 14.5  11.6 - 15.2 (seconds)      INR 1.11  0.00 - 1.49    APTT     Status: Normal   Collection Time   09/18/11 10:50 PM      Component Value Range Comment   aPTT 27  24 - 37 (seconds)   TROPONIN I     Status: Normal   Collection Time   09/18/11 10:52 PM      Component Value Range Comment   Troponin I <0.30  <0.30 (ng/mL)   TYPE AND SCREEN     Status: Normal (Preliminary result)   Collection Time   09/18/11 11:04 PM      Component Value Range Comment   ABO/RH(D) A POS      Antibody Screen NEG      Sample Expiration 09/21/2011      Unit Number [id number]      Blood Component Type RED CELLS,LR      Unit division 00      Status of Unit ALLOCATED      Transfusion Status OK TO TRANSFUSE      Crossmatch Result Compatible      Unit Number [id number]      Blood Component Type RED CELLS,LR      Unit division 00      Status of Unit ALLOCATED      Transfusion Status OK TO TRANSFUSE      Crossmatch Result Compatible     ABO/RH     Status: Normal   Collection Time   09/18/11 11:04 PM      Component Value Range Comment   ABO/RH(D) A POS     POCT I-STAT, CHEM 8     Status: Abnormal   Collection Time   09/18/11 11:16 PM      Component Value Range Comment   Sodium 141  135 - 145 (mEq/L)    Potassium  4.1  3.5 - 5.1 (mEq/L)    Chloride 108  96 - 112 (mEq/L)    BUN 35 (*) 6 - 23 (mg/dL)    Creatinine, Ser 1.60 (*) 0.50 - 1.10 (mg/dL)    Glucose, Bld 117 (*) 70 - 99 (mg/dL)    Calcium, Ion 1.15  1.12 - 1.32 (mmol/L)    TCO2 24  0 - 100 (mmol/L)    Hemoglobin 9.9 (*) 12.0 - 15.0 (g/dL)    HCT 29.0 (*) 36.0 - 46.0 (%)   HEMOGLOBIN AND HEMATOCRIT, BLOOD     Status: Abnormal   Collection Time   09/18/11 11:53 PM      Component Value Range Comment   Hemoglobin 9.8 (*) 12.0 - 15.0 (g/dL)    HCT 29.1 (*) 36.0 - 46.0 (%)   CBC     Status: Abnormal   Collection Time   09/19/11  2:02 AM      Component Value Range Comment   WBC 12.7 (*) 4.0 - 10.5 (K/uL) WHITE COUNT CONFIRMED ON SMEAR   RBC 2.61 (*) 3.87 - 5.11 (MIL/uL)    Hemoglobin  8.7 (*) 12.0 - 15.0 (g/dL)    HCT 25.6 (*) 36.0 - 46.0 (%)    MCV 98.1  78.0 - 100.0 (fL)    MCH 33.3  26.0 - 34.0 (pg)    MCHC 34.0  30.0 - 36.0 (g/dL)    RDW 15.1  11.5 - 15.5 (%)    Platelets 186  150 - 400 (K/uL)   LACTIC ACID, PLASMA     Status: Normal   Collection Time   09/19/11  2:02 AM      Component Value Range Comment   Lactic Acid, Venous 1.4  0.5 - 2.2 (mmol/L)   GLUCOSE, CAPILLARY     Status: Abnormal   Collection Time   09/19/11  2:10 AM      Component Value Range Comment   Glucose-Capillary 126 (*) 70 - 99 (mg/dL)    Comment 1 Documented in Chart      Comment 2 Notify RN     MRSA PCR SCREENING     Status: Normal   Collection Time   09/19/11  2:18 AM      Component Value Range Comment   MRSA by PCR NEGATIVE  NEGATIVE    CBC     Status: Abnormal   Collection Time   09/19/11  6:30 AM      Component Value Range Comment   WBC 7.6  4.0 - 10.5 (K/uL)    RBC 2.02 (*) 3.87 - 5.11 (MIL/uL)    Hemoglobin 6.4 (*) 12.0 - 15.0 (g/dL)    HCT 20.1 (*) 36.0 - 46.0 (%)    MCV 99.5  78.0 - 100.0 (fL)    MCH 32.7  26.0 - 34.0 (pg)    MCHC 32.8  30.0 - 36.0 (g/dL)    RDW 15.2  11.5 - 15.5 (%)    Platelets 135 (*) 150 - 400 (K/uL)   COMPREHENSIVE METABOLIC PANEL     Status: Abnormal   Collection Time   09/19/11  6:30 AM      Component Value Range Comment   Sodium 141  135 - 145 (mEq/L)    Potassium 4.2  3.5 - 5.1 (mEq/L)    Chloride 112  96 - 112 (mEq/L)    CO2 23  19 - 32 (mEq/L)    Glucose, Bld 158 (*) 70 - 99 (mg/dL)    BUN 34 (*)  6 - 23 (mg/dL)    Creatinine, Ser 0.99  0.50 - 1.10 (mg/dL) DELTA CHECK NOTED   Calcium 7.9 (*) 8.4 - 10.5 (mg/dL)    Total Protein 4.2 (*) 6.0 - 8.3 (g/dL)    Albumin 2.2 (*) 3.5 - 5.2 (g/dL)    AST 14  0 - 37 (U/L)    ALT 9  0 - 35 (U/L)    Alkaline Phosphatase 52  39 - 117 (U/L)    Total Bilirubin 0.1 (*) 0.3 - 1.2 (mg/dL)    GFR calc non Af Amer 51 (*) >90 (mL/min)    GFR calc Af Amer 59 (*) >90 (mL/min)   GLUCOSE, CAPILLARY     Status:  Abnormal   Collection Time   09/19/11  8:25 AM      Component Value Range Comment   Glucose-Capillary 118 (*) 70 - 99 (mg/dL)   PREPARE RBC (CROSSMATCH)     Status: Normal   Collection Time   09/19/11 10:00 AM      Component Value Range Comment   Order Confirmation ORDER PROCESSED BY BLOOD BANK       No results found.  ROS:  As stated above in the HPI otherwise negative.  Blood pressure 124/56, pulse 102, temperature 98 F (36.7 C), temperature source Oral, resp. rate 23, height 5\' 2"  (1.575 m), weight 127 lb 10.3 oz (57.9 kg), SpO2 99.00%.  PE: Gen: NAD, Alert and Oriented HEENT:  [place]/AT, EOMI Neck: Supple, no LAD Lungs: CTA Bilaterally CV: RRR without M/G/R ABM: Soft, NTND, +BS Ext: No C/C/E Rectal:  No active bleeding. No tenderness on exam-no masses felt  Assessment/Plan: 1) Rectal bleeding : No previous history of GI bleeding and no recent colonoscopy . Most likely source thought to be diverticular bleed at present. - Not actively bleeding at this point of time - though hemoglobin dropped to 6.4 < 8.7< 9.8< 9.9 (4/2//13)< 13.1( 03/2010) but hemodynamically stable - not tachycardic or hypotensive.  INR 1.1 and liver enzymes normal- except albumin is low. - Will transfuse 2 units of PRBC today( discussed this with patient) and prepare for colonoscopy for tomorrow. - If she has anymore bleeding episodes - will consider urgent colonoscopy if needed. - She has no significant cardiac history and so target hemoglobin will be more than 7.  2) Anemia : Most likely acute blood loss anemia  - Hemodynamically stable at present. - Transfuse with the goal hemoglobin more than 7 . - Colonoscopy is scheduled for tomorrow. Will check baseline anemia panel before starting transfusion .  PATEL,RAVI 09/19/2011, 10:01 AM  Patient seen and discussed with Dr. Criss Alvine.

## 2011-09-19 NOTE — Progress Notes (Signed)
[facility] [place] TEAM 1 - Stepdown/ICU TEAM  Subjective: 78 year-old female brought to the ER after having a syncopal episode following a rectal bleed. Patient states she has been having multiple bloody bowel movements since Tuesday morning 10 AM.  Her presenting Hgb was 9, with an apparent baseline Hgb of 13.    I have seen the patient for a f/u visit.  Objective: Weight change:   Intake/Output Summary (Last 24 hours) at 09/19/11 1454 Last data filed at 09/19/11 1400  Gross per 24 hour  Intake    515 ml  Output    575 ml  Net    -60 ml   Blood pressure 129/55, pulse 64, temperature 97.5 F (36.4 C), temperature source Oral, resp. rate 17, height 5\' 2"  (1.575 m), weight 57.9 kg (127 lb 10.3 oz), SpO2 100.00%.  CBG (last 3)   Basename 09/19/11 1211 09/19/11 0825 09/19/11 0210  GLUCAP 72 118* 126*   Physical Exam: F/U exam completed  Lab Results:  Basename 09/19/11 0630 09/18/11 2316 09/18/11 2250  NA 141 141 141  K 4.2 4.1 4.2  CL 112 108 106  CO2 23 -- 26  GLUCOSE 158* 117* 119*  BUN 34* 35* 35*  CREATININE 0.99 1.60* 1.24*  CALCIUM 7.9* -- 8.7  MG -- -- --  PHOS -- -- --    Basename 09/19/11 0630 09/18/11 2250  AST 14 18  ALT 9 12  ALKPHOS 52 64  BILITOT 0.1* 0.1*  PROT 4.2* 5.6*  ALBUMIN 2.2* 3.1*    Basename 09/19/11 1005 09/19/11 0630 09/19/11 0202 09/18/11 2250  WBC 7.8 7.6 12.7* --  NEUTROABS -- -- -- 4.7  HGB 6.5* 6.4* 8.7* --  HCT 19.4* 20.1* 25.6* --  MCV 99.0 99.5 98.1 --  PLT 119* 135* 186 --    Basename 09/18/11 2252  CKTOTAL --  CKMB --  CKMBINDEX --  TROPONINI <0.30   Micro Results: Recent Results (from the past 240 hour(s))  MRSA PCR SCREENING     Status: Normal   Collection Time   09/19/11  2:18 AM      Component Value Range Status Comment   MRSA by PCR NEGATIVE  NEGATIVE  Final     Studies/Results: All recent x-ray/radiology reports have been reviewed in detail.   Medications: I have reviewed the patient's  complete medication list.  Assessment/Plan:  Acute GI bleed  GI is following - plan is for colo in AM  Anemia from acute blood loss Follow serial CBC - transfuse to keep Hgb > 7.0  History of DVT 2 years ago is off Coumadin but on aspirin  Hx of HTN Not an acute issue - follow as volume becomes more stable  Macular degeneration and glaucoma  Cherene Altes, MD [facility] Office  [phone] Pager [phone]  On-Call/Text Page:      [email or web address]      password [facility]

## 2011-09-19 NOTE — H&P (Signed)
Cindy Schultz is an 78 y.o. female.   PCP - Dr.Hawkins in [place]. Chief Complaint: Rectal bleeding and loss of consciousness. HPI: 78 year-old female was brought to the ER after patient had a syncopal episode following a rectal bleed. Patient states she has been having multiple bloody bowel movements since yesterday morning 10 AM. She initially drank some milk of magnesia for constipation. After which she has had multiple episodes of rectal bleed. She has not had any abdominal pain or nausea or vomiting. No fever or chills. Last evening around 10 PM when she was with her friend she stood up when she had spontaneous rectal bleed and it was profuse. A few seconds later she lost her consciousness for a brief amount. EMS was called and was brought to the ER. The backboard on which he was brought was full of blood. Patient was found to be hemodynamically stable. Her hemoglobin was around 9. She had a hemoglobin of 13, 2 years ago. Repeat hemoglobin still is around 9. Patient has got 500 cc normal saline bolus after which she is getting continuous IV fluids. Presently patient is hemodynamically stable. Patient has not had any focal deficits, chest pain, shortness of breath, headache, abdominal pain.  Past Medical History  Diagnosis Date  . Macular degeneration   . Glaucoma   . Precancerous changes of the cervix     lesions  . Rectocele   . Diverticulitis   . Lumbar spine pain   . Pain in joints   . Hearing loss   . HTN (hypertension)     cholesterol    Past Surgical History  Procedure Date  . Salk   . Tracheotomy   . Fallen uterus   . Ovarian cyst surgery   . Bladder tac   . Hemmorrhoidectomy   . Met osteostomy   . Left total knee replacement 05/02/04    [name]  . Morton's nueroma   . Elevated metatarsal arch   . Rupture rt groin   . Appendix   . Rectocele repair   . Corneal erosion     cataracts     History reviewed. No pertinent family history. Social History:  reports  that she has never smoked. She does not have any smokeless tobacco history on file. She reports that she does not drink alcohol or use illicit drugs.  Allergies:  Allergies  Allergen Reactions  . Codeine Nausea And Vomiting  . Hydrocodone Other (See Comments)    Unknown Reaction   . Promethazine Hcl     REACTION: deathly sick  . Tramadol Hcl Other (See Comments)    Motion Sickness    Medications Prior to Admission  Medication Dose Route Frequency Provider Last Rate Last Dose  . 0.9 %  sodium chloride infusion   Intravenous Continuous Rise Patience, MD      . acetaminophen (TYLENOL) tablet 650 mg  650 mg Oral Q6H PRN Rise Patience, MD       Or  . acetaminophen (TYLENOL) suppository 650 mg  650 mg Rectal Q6H PRN Rise Patience, MD      . ALPRAZolam Duanne Moron) tablet 0.25 mg  0.25 mg Oral Once Rise Patience, MD      . dorzolamide-timolol (COSOPT) 22.3-6.8 MG/ML ophthalmic solution 1 drop  1 drop Both Eyes BID Rise Patience, MD      . fluticasone (FLONASE) 50 MCG/ACT nasal spray 2 spray  2 spray Each Nare Daily Rise Patience, MD      .  latanoprost (XALATAN) 0.005 % ophthalmic solution 1 drop  1 drop Right Eye QHS Rise Patience, MD      . ondansetron [facility]) tablet 4 mg  4 mg Oral Q6H PRN Rise Patience, MD       Or  . ondansetron [facility]) injection 4 mg  4 mg Intravenous Q6H PRN Rise Patience, MD      . pantoprazole (PROTONIX) injection 40 mg  40 mg Intravenous Q12H Rise Patience, MD      . sodium chloride 0.9 % bolus 500 mL  500 mL Intravenous Once Ovid Curd R. Pickering, MD   500 mL at 09/18/11 2309  . sodium chloride 0.9 % injection 3 mL  3 mL Intravenous Q12H Rise Patience, MD      . DISCONTD: 0.9 %  sodium chloride infusion   Intravenous Continuous Jasper Riling. Alvino Chapel, MD 125 mL/hr at 09/19/11 0027     Medications Prior to Admission  Medication Sig Dispense Refill  . allopurinol (ZYLOPRIM) 300 MG tablet Take 300 mg by mouth  daily.        Marland Kitchen aspirin 81 MG tablet Take 81 mg by mouth daily.        . Calcium 600-200 MG-UNIT per tablet Take 1 tablet by mouth daily.        . colesevelam (WELCHOL) 625 MG tablet Take 1,875 mg by mouth 2 (two) times daily with a meal.        . dorzolamide-timolol (COSOPT) 22.3-6.8 MG/ML ophthalmic solution Place 1 drop into both eyes 2 (two) times daily.        . fluticasone (FLONASE) 50 MCG/ACT nasal spray Place 2 sprays into the nose daily.        Marland Kitchen latanoprost (XALATAN) 0.005 % ophthalmic solution Place 1 drop into the right eye at bedtime.       . Multiple Vitamins-Minerals (PRESERVISION AREDS PO) Take 2 tablets by mouth 2 (two) times daily.       . naproxen (NAPROSYN) 500 MG tablet Take 1 tablet (500 mg total) by mouth 2 (two) times daily.  30 tablet  0  . NIFEdipine (PROCARDIA-XL/ADALAT CC) 30 MG 24 hr tablet Take 30 mg by mouth daily.        Vladimir Faster Glycol-Propyl Glycol (SYSTANE ULTRA OP) Apply 1 drop to eye 3 (three) times daily.         Results for orders placed during the hospital encounter of 09/18/11 (from the past 48 hour(s))  CBC     Status: Abnormal   Collection Time   09/18/11 10:50 PM      Component Value Range Comment   WBC 9.4  4.0 - 10.5 (K/uL)    RBC 2.97 (*) 3.87 - 5.11 (MIL/uL)    Hemoglobin 9.9 (*) 12.0 - 15.0 (g/dL)    HCT 29.7 (*) 36.0 - 46.0 (%)    MCV 100.0  78.0 - 100.0 (fL)    MCH 33.3  26.0 - 34.0 (pg)    MCHC 33.3  30.0 - 36.0 (g/dL)    RDW 15.1  11.5 - 15.5 (%)    Platelets 165  150 - 400 (K/uL)   DIFFERENTIAL     Status: Normal   Collection Time   09/18/11 10:50 PM      Component Value Range Comment   Neutrophils Relative 50  43 - 77 (%)    Neutro Abs 4.7  1.7 - 7.7 (K/uL)    Lymphocytes Relative 38  12 - 46 (%)  Lymphs Abs 3.6  0.7 - 4.0 (K/uL)    Monocytes Relative 10  3 - 12 (%)    Monocytes Absolute 0.9  0.1 - 1.0 (K/uL)    Eosinophils Relative 2  0 - 5 (%)    Eosinophils Absolute 0.2  0.0 - 0.7 (K/uL)    Basophils Relative 0  0 - 1  (%)    Basophils Absolute 0.0  0.0 - 0.1 (K/uL)   COMPREHENSIVE METABOLIC PANEL     Status: Abnormal   Collection Time   09/18/11 10:50 PM      Component Value Range Comment   Sodium 141  135 - 145 (mEq/L)    Potassium 4.2  3.5 - 5.1 (mEq/L)    Chloride 106  96 - 112 (mEq/L)    CO2 26  19 - 32 (mEq/L)    Glucose, Bld 119 (*) 70 - 99 (mg/dL)    BUN 35 (*) 6 - 23 (mg/dL)    Creatinine, Ser 1.24 (*) 0.50 - 1.10 (mg/dL)    Calcium 8.7  8.4 - 10.5 (mg/dL)    Total Protein 5.6 (*) 6.0 - 8.3 (g/dL)    Albumin 3.1 (*) 3.5 - 5.2 (g/dL)    AST 18  0 - 37 (U/L)    ALT 12  0 - 35 (U/L)    Alkaline Phosphatase 64  39 - 117 (U/L)    Total Bilirubin 0.1 (*) 0.3 - 1.2 (mg/dL)    GFR calc non Af Amer 38 (*) >90 (mL/min)    GFR calc Af Amer 45 (*) >90 (mL/min)   PROTIME-INR     Status: Normal   Collection Time   09/18/11 10:50 PM      Component Value Range Comment   Prothrombin Time 14.5  11.6 - 15.2 (seconds)    INR 1.11  0.00 - 1.49    APTT     Status: Normal   Collection Time   09/18/11 10:50 PM      Component Value Range Comment   aPTT 27  24 - 37 (seconds)   TROPONIN I     Status: Normal   Collection Time   09/18/11 10:52 PM      Component Value Range Comment   Troponin I <0.30  <0.30 (ng/mL)   TYPE AND SCREEN     Status: Normal   Collection Time   09/18/11 11:04 PM      Component Value Range Comment   ABO/RH(D) A POS      Antibody Screen NEG      Sample Expiration 09/21/2011     ABO/RH     Status: Normal   Collection Time   09/18/11 11:04 PM      Component Value Range Comment   ABO/RH(D) A POS     POCT I-STAT, CHEM 8     Status: Abnormal   Collection Time   09/18/11 11:16 PM      Component Value Range Comment   Sodium 141  135 - 145 (mEq/L)    Potassium 4.1  3.5 - 5.1 (mEq/L)    Chloride 108  96 - 112 (mEq/L)    BUN 35 (*) 6 - 23 (mg/dL)    Creatinine, Ser 1.60 (*) 0.50 - 1.10 (mg/dL)    Glucose, Bld 117 (*) 70 - 99 (mg/dL)    Calcium, Ion 1.15  1.12 - 1.32 (mmol/L)    TCO2 24  0 -  100 (mmol/L)    Hemoglobin 9.9 (*) 12.0 - 15.0 (g/dL)  HCT 29.0 (*) 36.0 - 46.0 (%)   HEMOGLOBIN AND HEMATOCRIT, BLOOD     Status: Abnormal   Collection Time   09/18/11 11:53 PM      Component Value Range Comment   Hemoglobin 9.8 (*) 12.0 - 15.0 (g/dL)    HCT 29.1 (*) 36.0 - 46.0 (%)    No results found.  Review of Systems  Constitutional: Negative.   HENT: Negative.   Eyes: Negative.   Respiratory: Negative.   Cardiovascular: Negative.   Gastrointestinal:       Rectal bleed.  Genitourinary: Negative.   Musculoskeletal: Negative.   Skin: Negative.   Neurological: Positive for loss of consciousness.  Endo/Heme/Allergies: Negative.   Psychiatric/Behavioral: Negative.     Blood pressure 136/66, pulse 78, temperature 97.4 F (36.3 C), temperature source Oral, resp. rate 19, height 5\' 2"  (1.575 m), weight 57.834 kg (127 lb 8 oz), SpO2 100.00%. Physical Exam  Constitutional: She is oriented to person, place, and time. She appears well-developed and well-nourished. No distress.  HENT:  Head: Normocephalic and atraumatic.  Right Ear: External ear normal.  Left Ear: External ear normal.  Eyes: Conjunctivae are normal. Pupils are equal, round, and reactive to light.  Neck: Normal range of motion. Neck supple.  Cardiovascular: Normal rate and regular rhythm.   Respiratory: Effort normal and breath sounds normal. No respiratory distress. She has no wheezes. She has no rales.  GI: Soft. Bowel sounds are normal. She exhibits no distension. There is no tenderness. There is no rebound.  Musculoskeletal: Normal range of motion. She exhibits no edema and no tenderness.  Neurological: She is alert and oriented to person, place, and time.       Moves all extremities.  Skin: Skin is warm and dry. No rash noted. She is not diaphoretic. No erythema.  Psychiatric: Her behavior is normal.     Assessment/Plan #1. Acute GI bleed probably lower - patient will be kept n.p.o. Type and screen PRBCs  and hold transfuse as needed. Recheck CBC every 4 hours for at least 3 more times. Patient does state aspirin and NSAIDs. But given the fact that this is frank blood most likely this is lower GI bleed probably from diverticula. Last time she had a colonoscopy was in 1999. For now I will also add PPI to IV. Consult GI in a.m. or sooner if there is another episode of bleed or if patient gets hemodynamically unstable. #2. History of rectal and uterine prolapse - patient states she had a large hematoma after she had a rectocele correction 5 years ago and at that time she had a lot of loss of blood. #3. Anemia from acute blood loss from GI bleed. #4. History of DVT 2 years ago is off Coumadin but on aspirin. #5. History of hypertension.  CODE STATUS - full code.  Antaniya Venuti N. 09/19/2011, 1:28 AM

## 2011-09-20 ENCOUNTER — Encounter (HOSPITAL_COMMUNITY): Payer: Self-pay | Admitting: *Deleted

## 2011-09-20 ENCOUNTER — Encounter (HOSPITAL_COMMUNITY)
Admission: EM | Disposition: A | Discharge status: Skilled Nursing Facility | Payer: Self-pay | Source: Home / Self Care | Attending: Internal Medicine

## 2011-09-20 DIAGNOSIS — R55 Syncope and collapse: Secondary | ICD-10-CM | POA: Diagnosis present

## 2011-09-20 HISTORY — PX: COLONOSCOPY: SHX5424

## 2011-09-20 LAB — CBC
Hemoglobin: 9.5 g/dL — ABNORMAL LOW (ref 12.0–15.0)
Hemoglobin: 9.6 g/dL — ABNORMAL LOW (ref 12.0–15.0)
MCH: 29.6 pg (ref 26.0–34.0)
MCH: 30.1 pg (ref 26.0–34.0)
MCHC: 33.7 g/dL (ref 30.0–36.0)
MCHC: 33.7 g/dL (ref 30.0–36.0)
MCV: 89.3 fL (ref 78.0–100.0)
Platelets: 121 10*3/uL — ABNORMAL LOW (ref 150–400)
RBC: 3.21 MIL/uL — ABNORMAL LOW (ref 3.87–5.11)
RBC: 3.19 MIL/uL — ABNORMAL LOW (ref 3.87–5.11)

## 2011-09-20 LAB — GLUCOSE, CAPILLARY
Glucose-Capillary: 103 mg/dL — ABNORMAL HIGH (ref 70–99)
Glucose-Capillary: 125 mg/dL — ABNORMAL HIGH (ref 70–99)

## 2011-09-20 LAB — BASIC METABOLIC PANEL
Calcium: 8.3 mg/dL — ABNORMAL LOW (ref 8.4–10.5)
GFR calc non Af Amer: 52 mL/min — ABNORMAL LOW (ref >90)
Potassium: 3.6 mEq/L (ref 3.5–5.1)
Sodium: 143 mEq/L (ref 135–145)

## 2011-09-20 SURGERY — COLONOSCOPY
Anesthesia: Moderate Sedation

## 2011-09-20 MED ORDER — MIDAZOLAM HCL 10 MG/2ML IJ SOLN
INTRAMUSCULAR | Status: DC | PRN
  Administered 2011-09-20: 2.5 mg via INTRAVENOUS

## 2011-09-20 MED ORDER — NIFEDIPINE ER 30 MG PO TB24
30.0000 mg | ORAL_TABLET | Freq: Every day | ORAL | Status: DC
  Administered 2011-09-20 – 2011-09-23 (×4): 30 mg via ORAL
  Filled 2011-09-20 (×4): qty 1

## 2011-09-20 MED ORDER — FENTANYL NICU IV SYRINGE 50 MCG/ML
INJECTION | INTRAMUSCULAR | Status: DC | PRN
  Administered 2011-09-20: 25 ug via INTRAVENOUS

## 2011-09-20 MED ORDER — FENTANYL CITRATE 0.05 MG/ML IJ SOLN
INTRAMUSCULAR | Status: AC
  Filled 2011-09-20: qty 4

## 2011-09-20 MED ORDER — ALLOPURINOL 300 MG PO TABS
300.0000 mg | ORAL_TABLET | Freq: Every day | ORAL | Status: DC
  Administered 2011-09-20 – 2011-09-26 (×7): 300 mg via ORAL
  Filled 2011-09-20 (×7): qty 1

## 2011-09-20 MED ORDER — MIDAZOLAM HCL 10 MG/2ML IJ SOLN
INTRAMUSCULAR | Status: AC
  Filled 2011-09-20: qty 4

## 2011-09-20 NOTE — Interval H&P Note (Deleted)
History and Physical Interval Note:  09/20/2011 3:37 PM  Cindy Schultz  has presented today for surgery, with the diagnosis of rectal bleed  The various methods of treatment have been discussed with the patient and family. After consideration of risks, benefits and other options for treatment, the patient has consented to  Procedure(s) (LRB): COLONOSCOPY (N/A) as a surgical intervention .  The patients' history has been reviewed, patient examined, no change in status, stable for surgery.  I have reviewed the patients' chart and labs.  Questions were answered to the patient's satisfaction.     Cindy Schultz D

## 2011-09-20 NOTE — Op Note (Signed)
[facility] [place], [place]  [address]  OPERATIVE PROCEDURE REPORT  PATIENT:  Cindy Schultz, Cindy Schultz  MR#:  [id number] BIRTHDATE:  [birth year]  GENDER:  female ENDOSCOPIST:  Carol Ada, MD ASSISTANT:  Truddie Coco, RN, CGRN and Baltazar Najjar. Wilson PROCEDURE DATE:  09/20/2011 PROCEDURE:  Colonoscopy [id number] ASA CLASS:  Class III INDICATIONS:  Hematochezia MEDICATIONS:  Fentanyl 25 mcg IV, Versed 2.5 mg IV  DESCRIPTION OF PROCEDURE:   After the risks benefits and alternatives of the procedure were thoroughly explained, informed consent was obtained.  Digital rectal exam was performed and revealed no abnormalities.   The Pentax Colonoscope [id number] endoscope was introduced through the anus and advanced to the terminal ileum which was intubated for a short distance, without limitations.  The quality of the prep was good..  The instrument was then slowly withdrawn as the colon was fully examined. <<PROCEDUREIMAGES>>  FINDINGS:  A pandiverticulosis was identified. There was fresh blood and clots in the distal colon, but no bleeding was noted in the ascending colon or TI. The source of the bleeding was not identified. A small 3 mm sessile ascending colon polyp was found, but it was not removed with her bleeding issues.   Retroflexed views in the rectum revealed internal and external hemorrhoids. The scope was then withdrawn from the patient and the procedure terminated.  COMPLICATIONS:  None  IMPRESSION:  1) Pan-Diverticula 2) Internal and external hemorrhoids 3) Small 3 mm ascending colon polyp, which was not removed. RECOMMENDATIONS:  1) Monitor HGB. 2) Transfuse if necessary. 3) If patient continues to bleed a surgical consultation will be required.  ______________________________ Carol Ada, MD  n. Lorrin Mais:   Carol Ada at 09/20/2011 05:22 PM  [name], [id number]

## 2011-09-20 NOTE — Progress Notes (Signed)
[facility]  Interim history: 78 y/o female brought in for syncope and profuse rectal bleed.   Subjective: She is drinking her colon prep. States that she thinks the bleeding stopped sometime in the middle of the night. No abd pain.    Objective: Blood pressure 144/63, pulse 78, temperature 97.4 F (36.3 C), temperature source Oral, resp. rate 16, height 5\' 2"  (1.575 m), weight 57.9 kg (127 lb 10.3 oz), SpO2 98.00%. Weight change:   Intake/Output Summary (Last 24 hours) at 09/20/11 0931 Last data filed at 09/20/11 0701  Gross per 24 hour  Intake   6228 ml  Output   2080 ml  Net   4148 ml    Physical Exam: General appearance: alert, cooperative and no distress Throat: lips, mucosa, and tongue normal; teeth and gums normal Lungs: clear to auscultation bilaterally Heart: regular rate and rhythm, S1, S2 normal, no murmur, click, rub or gallop- tachycardia Abdomen: soft, non-tender; bowel sounds normal; no masses,  no organomegaly Extremities: extremities normal, atraumatic, no cyanosis or edema  Lab Results:  Basename 09/20/11 0405 09/19/11 0630  NA 143 141  K 3.6 4.2  CL 113* 112  CO2 22 23  GLUCOSE 94 158*  BUN 23 34*  CREATININE 0.97 0.99  CALCIUM 8.3* 7.9*  MG -- --  PHOS -- --    Basename 09/19/11 0630 09/18/11 2250  AST 14 18  ALT 9 12  ALKPHOS 52 64  BILITOT 0.1* 0.1*  PROT 4.2* 5.6*  ALBUMIN 2.2* 3.1*   No results found for this basename: LIPASE:2,AMYLASE:2 in the last 72 hours  Basename 09/20/11 0405 09/19/11 2201 09/18/11 2250  WBC 9.8 10.4 --  NEUTROABS -- -- 4.7  HGB 9.5* 9.5* --  HCT 28.2* 28.3* --  MCV 87.9 88.4 --  PLT 121* 114* --    Basename 09/18/11 2252  CKTOTAL --  CKMB --  CKMBINDEX --  TROPONINI <0.30   No components found with this basename: POCBNP:3 No results found for this basename: DDIMER:2 in the last 72 hours No results found for this basename: HGBA1C:2 in the last 72 hours No results found for this basename:  CHOL:2,HDL:2,LDLCALC:2,TRIG:2,CHOLHDL:2,LDLDIRECT:2 in the last 72 hours No results found for this basename: TSH,T4TOTAL,FREET3,T3FREE,THYROIDAB in the last 72 hours  Basename 09/19/11 1244  VITAMINB12 620  FOLATE >20.0  FERRITIN 56  TIBC 176*  IRON 48  RETICCTPCT 2.5    Micro Results: Recent Results (from the past 240 hour(s))  MRSA PCR SCREENING     Status: Normal   Collection Time   09/19/11  2:18 AM      Component Value Range Status Comment   MRSA by PCR NEGATIVE  NEGATIVE  Final     Studies/Results: No results found.  Medications: Scheduled Meds:    . allopurinol  300 mg Oral Daily  . ALPRAZolam  0.25 mg Oral Once  . dorzolamide-timolol  1 drop Both Eyes BID  . fluticasone  2 spray Each Nare Daily  . latanoprost  1 drop Right Eye QHS  . NIFEdipine  30 mg Oral Daily  . pantoprazole (PROTONIX) IV  40 mg Intravenous Q12H  . polyethylene glycol-electrolytes  4,000 mL Oral Once  . sodium chloride  3 mL Intravenous Q12H   Continuous Infusions:    . sodium chloride 150 mL/hr at 09/19/11 0701   PRN Meds:.acetaminophen, acetaminophen, ondansetron (ZOFRAN) IV, ondansetron  Assessment/Plan: Principal Problem:  *GI bleed Heavy rectal bleed for about 24 hrs. Stopped bleeding for now. Colonoscopy later today.  Active Problems:  Anemia due to blood loss, acute Hgb dropped to a nadir of 6.4, s/p 2 units PRBC on 4/3   Syncopal episodes Due to acute blood loss   HTN (hypertension) BP high now, will resume Procardia today  Gout Resume allopurinol    LOS: 2 days   [facility] [phone] 09/20/2011, 9:31 AM

## 2011-09-20 NOTE — H&P (View-Only) (Deleted)
[facility]  Interim history: 78 y/o female brought in for syncope and profuse rectal bleed.   Subjective: She is drinking her colon prep. States that she thinks the bleeding stopped sometime in the middle of the night. No abd pain.    Objective: Blood pressure 144/63, pulse 78, temperature 97.4 F (36.3 C), temperature source Oral, resp. rate 16, height 5\' 2"  (1.575 m), weight 57.9 kg (127 lb 10.3 oz), SpO2 98.00%. Weight change:   Intake/Output Summary (Last 24 hours) at 09/20/11 0931 Last data filed at 09/20/11 0701  Gross per 24 hour  Intake   6228 ml  Output   2080 ml  Net   4148 ml    Physical Exam: General appearance: alert, cooperative and no distress Throat: lips, mucosa, and tongue normal; teeth and gums normal Lungs: clear to auscultation bilaterally Heart: regular rate and rhythm, S1, S2 normal, no murmur, click, rub or gallop- tachycardia Abdomen: soft, non-tender; bowel sounds normal; no masses,  no organomegaly Extremities: extremities normal, atraumatic, no cyanosis or edema  Lab Results:  Basename 09/20/11 0405 09/19/11 0630  NA 143 141  K 3.6 4.2  CL 113* 112  CO2 22 23  GLUCOSE 94 158*  BUN 23 34*  CREATININE 0.97 0.99  CALCIUM 8.3* 7.9*  MG -- --  PHOS -- --    Basename 09/19/11 0630 09/18/11 2250  AST 14 18  ALT 9 12  ALKPHOS 52 64  BILITOT 0.1* 0.1*  PROT 4.2* 5.6*  ALBUMIN 2.2* 3.1*   No results found for this basename: LIPASE:2,AMYLASE:2 in the last 72 hours  Basename 09/20/11 0405 09/19/11 2201 09/18/11 2250  WBC 9.8 10.4 --  NEUTROABS -- -- 4.7  HGB 9.5* 9.5* --  HCT 28.2* 28.3* --  MCV 87.9 88.4 --  PLT 121* 114* --    Basename 09/18/11 2252  CKTOTAL --  CKMB --  CKMBINDEX --  TROPONINI <0.30   No components found with this basename: POCBNP:3 No results found for this basename: DDIMER:2 in the last 72 hours No results found for this basename: HGBA1C:2 in the last 72 hours No results found for this basename:  CHOL:2,HDL:2,LDLCALC:2,TRIG:2,CHOLHDL:2,LDLDIRECT:2 in the last 72 hours No results found for this basename: TSH,T4TOTAL,FREET3,T3FREE,THYROIDAB in the last 72 hours  Basename 09/19/11 1244  VITAMINB12 620  FOLATE >20.0  FERRITIN 56  TIBC 176*  IRON 48  RETICCTPCT 2.5    Micro Results: Recent Results (from the past 240 hour(s))  MRSA PCR SCREENING     Status: Normal   Collection Time   09/19/11  2:18 AM      Component Value Range Status Comment   MRSA by PCR NEGATIVE  NEGATIVE  Final     Studies/Results: No results found.  Medications: Scheduled Meds:    . allopurinol  300 mg Oral Daily  . ALPRAZolam  0.25 mg Oral Once  . dorzolamide-timolol  1 drop Both Eyes BID  . fluticasone  2 spray Each Nare Daily  . latanoprost  1 drop Right Eye QHS  . NIFEdipine  30 mg Oral Daily  . pantoprazole (PROTONIX) IV  40 mg Intravenous Q12H  . polyethylene glycol-electrolytes  4,000 mL Oral Once  . sodium chloride  3 mL Intravenous Q12H   Continuous Infusions:    . sodium chloride 150 mL/hr at 09/19/11 0701   PRN Meds:.acetaminophen, acetaminophen, ondansetron (ZOFRAN) IV, ondansetron  Assessment/Plan: Principal Problem:  *GI bleed Heavy rectal bleed for about 24 hrs. Stopped bleeding for now. Colonoscopy later today.  Active Problems:  Anemia due to blood loss, acute Hgb dropped to a nadir of 6.4, s/p 2 units PRBC on 4/3   Syncopal episodes Due to acute blood loss   HTN (hypertension) BP high now, will resume Procardia today  Gout Resume allopurinol    LOS: 2 days   [facility] [phone] 09/20/2011, 9:31 AM

## 2011-09-21 ENCOUNTER — Encounter (HOSPITAL_COMMUNITY): Payer: Self-pay | Admitting: Gastroenterology

## 2011-09-21 LAB — GLUCOSE, CAPILLARY
Glucose-Capillary: 97 mg/dL (ref 70–99)
Glucose-Capillary: 107 mg/dL — ABNORMAL HIGH (ref 70–99)
Glucose-Capillary: 120 mg/dL — ABNORMAL HIGH (ref 70–99)
Glucose-Capillary: 125 mg/dL — ABNORMAL HIGH (ref 70–99)

## 2011-09-21 LAB — CBC
HCT: 20.4 % — ABNORMAL LOW (ref 36.0–46.0)
HCT: 33 % — ABNORMAL LOW (ref 36.0–46.0)
MCH: 30.6 pg (ref 26.0–34.0)
MCH: 29.9 pg (ref 26.0–34.0)
MCHC: 34.8 g/dL (ref 30.0–36.0)
MCV: 88.3 fL (ref 78.0–100.0)
MCV: 87.8 fL (ref 78.0–100.0)
Platelets: 98 10*3/uL — ABNORMAL LOW (ref 150–400)
RBC: 2.31 MIL/uL — ABNORMAL LOW (ref 3.87–5.11)
RBC: 3.64 MIL/uL — ABNORMAL LOW (ref 3.87–5.11)
RDW: 18.5 % — ABNORMAL HIGH (ref 11.5–15.5)
WBC: 8.1 10*3/uL (ref 4.0–10.5)

## 2011-09-21 MED ORDER — ZOLPIDEM TARTRATE 5 MG PO TABS
5.0000 mg | ORAL_TABLET | Freq: Every evening | ORAL | Status: DC | PRN
  Administered 2011-09-21 – 2011-09-25 (×5): 5 mg via ORAL
  Filled 2011-09-21 (×5): qty 1

## 2011-09-21 MED ORDER — POLYETHYLENE GLYCOL 3350 17 G PO PACK
17.0000 g | PACK | Freq: Two times a day (BID) | ORAL | Status: DC
  Administered 2011-09-21 – 2011-09-22 (×2): 17 g via ORAL
  Filled 2011-09-21 (×3): qty 1

## 2011-09-21 MED ORDER — DIPHENHYDRAMINE HCL 50 MG/ML IJ SOLN: 25.0000 mg | Freq: Once | INTRAMUSCULAR | Status: DC

## 2011-09-21 MED ORDER — ACETAMINOPHEN 325 MG PO TABS: 650.0000 mg | ORAL_TABLET | Freq: Once | ORAL | Status: DC

## 2011-09-21 NOTE — Progress Notes (Signed)
CRITICAL VALUE ALERT  Critical value received:  Hgb 6.8  Date of notification:  09/21/11  Time of notification: 0520  Critical value read back: yes  Nurse who received alert:  Wyn Quaker  MD notified (1st page):  Grandville Silos  Time of first page:  0520  MD notified (2nd page):  Time of second page:  Responding MD:  Grandville Silos  Time MD responded:  [phone]  New orders placed for 2 units of PRBCs to be administered.   Wyn Quaker RN

## 2011-09-21 NOTE — Progress Notes (Signed)
eLink Physician-Brief Progress Note Patient Name: Cindy Schultz DOB: Sep 19, 1923 MRN: [id number]  Date of Service  09/21/2011   HPI/Events of Note  Admitted for GI Bleed NOS  bp 107/52, map 64, HR 70. No overt bleeding at this point   Lab 09/21/11 0430 09/20/11 1751 09/20/11 0405 09/19/11 2201 09/19/11 1005  HGB 6.8* 9.6* 9.5* 9.5* 6.5*   This is nearly 3gm% drop in 12h and c/w bleed  eICU Interventions  1 unit stat and then reassess    Intervention Category Intermediate Interventions: Bleeding - evaluation and treatment with blood products;Diagnostic test evaluation  Corrion Stirewalt 09/21/2011, 5:23 AM

## 2011-09-21 NOTE — Progress Notes (Signed)
[facility] [place] TEAM 1 - Stepdown/ICU TEAM  Subjective: 78 year-old female brought to the ER after having a syncopal episode following a rectal bleed. Patient states she has been having multiple bloody bowel movements since Tuesday morning 10 AM.  Her presenting Hgb was 9, with an apparent baseline Hgb of 13.    The pt is resting comfortably at present.  She does report a BM this morning that looked like "stool mixed w/ blood."  She denies abdom pain, f/c, n/v, cp, or sob.    Objective: Weight change:   Intake/Output Summary (Last 24 hours) at 09/21/11 1513 Last data filed at 09/21/11 1042  Gross per 24 hour  Intake   1126 ml  Output    900 ml  Net    226 ml   Blood pressure 134/57, pulse 77, temperature 98.1 F (36.7 C), temperature source Oral, resp. rate 10, height 5\' 2"  (1.575 m), weight 57.9 kg (127 lb 10.3 oz), SpO2 100.00%.  CBG (last 3)   Basename 09/21/11 1152 09/21/11 0803 09/21/11 0422  GLUCAP 120* 107* 97   Physical Exam:  General: No acute respiratory distress Lungs: Clear to auscultation bilaterally without wheezes or crackles Cardiovascular: Regular rate and rhythm without murmur gallop or rub  Abdomen: Nontender, nondistended, soft, bowel sounds positive, no rebound, no ascites, no appreciable mass Extremities: No significant cyanosis, clubbing, or edema bilateral lower extremities  Lab Results:  Basename 09/20/11 0405 09/19/11 0630 09/18/11 2316 09/18/11 2250  NA 143 141 141 --  K 3.6 4.2 4.1 --  CL 113* 112 108 --  CO2 22 23 -- 26  GLUCOSE 94 158* 117* --  BUN 23 34* 35* --  CREATININE 0.97 0.99 1.60* --  CALCIUM 8.3* 7.9* -- 8.7  MG -- -- -- --  PHOS -- -- -- --    Basename 09/19/11 0630 09/18/11 2250  AST 14 18  ALT 9 12  ALKPHOS 52 64  BILITOT 0.1* 0.1*  PROT 4.2* 5.6*  ALBUMIN 2.2* 3.1*    Basename 09/21/11 0430 09/20/11 1751 09/20/11 0405 09/18/11 2250  WBC 8.1 11.6* 9.8 --  NEUTROABS -- -- -- 4.7  HGB 6.8* 9.6* 9.5* --   HCT 20.4* 28.5* 28.2* --  MCV 88.3 89.3 87.9 --  PLT 119* 126* 121* --    Basename 09/18/11 2252  CKTOTAL --  CKMB --  CKMBINDEX --  TROPONINI <0.30   Micro Results: Recent Results (from the past 240 hour(s))  MRSA PCR SCREENING     Status: Normal   Collection Time   09/19/11  2:18 AM      Component Value Range Status Comment   MRSA by PCR NEGATIVE  NEGATIVE  Final     Studies/Results: All recent x-ray/radiology reports have been reviewed in detail.   Medications: I have reviewed the patient's complete medication list.  Assessment/Plan:  Acute GI bleed/BRBPR GI is following - colo reveals pan-diverticula as well as hemorrhoids, but a clear pinpoint source of bleeding could not be identified - will need Gen Surg consultation if bleeding continues vs/ IR consult for NM bleeding scan and possible selective embolization  Anemia from acute blood loss Hgb nadir of 6.4 - Required blood transfusion this morning, with total of 3U PRBC transfused thus far - follow serial CBC - cont to transfuse prn to keep Hgb > 7.0  Syncopal spell Due to hypovolemia / acute blood loss - no sx of orthostasis at present   History of DVT 2 years ago  is off Coumadin but on aspirin  Hx of HTN Not an acute issue - follow as volume becomes more stable  Gout On prophy med tx  Macular degeneration and glaucoma  Dispo Keep on SDU due to risk for recurrent brisk GIB  Cherene Altes, MD [facility] Office  [phone] Pager [phone]  On-Call/Text Page:      [email or web address]      password [facility]

## 2011-09-21 NOTE — Progress Notes (Signed)
Patient ID: Cindy Schultz, female   DOB: 06-Oct-1923, 78 y.o.   MRN: [id number] Subjective: Patient reports that she still have some hematochezia, but it is not as severe.  She required another round of transfusions.  Objective: Vital signs in last 24 hours: Temp:  [97.4 F (36.3 C)-98.7 F (37.1 C)] 98.6 F (37 C) (04/05 1300) Pulse Rate:  [61-86] 77  (04/05 0700) Resp:  [10-21] 10  (04/05 0700) BP: (93-146)/(41-67) 146/64 mmHg (04/05 1300) SpO2:  [100 %] 100 % (04/05 0425) Last BM Date: 09/21/11  Intake/Output from previous day: 04/04 0701 - 04/05 0700 In: 973 [I.V.:828; Blood:125; IV Piggyback:20] Out: 3400 [Urine:3400] Intake/Output this shift: Total I/O In: 891 [I.V.:78; Blood:813] Out: -   General appearance: alert and no distress GI: soft, non-tender; bowel sounds normal; no masses,  no organomegaly  Lab Results:  Basename 09/21/11 1606 09/21/11 1458 09/21/11 0430  WBC 13.3* 12.8* 8.1  HGB 10.9* 11.5* 6.8*  HCT 31.4* 33.0* 20.4*  PLT 98* 103* 119*   BMET  Basename 09/20/11 0405 09/19/11 0630 09/18/11 2316 09/18/11 2250  NA 143 141 141 --  K 3.6 4.2 4.1 --  CL 113* 112 108 --  CO2 22 23 -- 26  GLUCOSE 94 158* 117* --  BUN 23 34* 35* --  CREATININE 0.97 0.99 1.60* --  CALCIUM 8.3* 7.9* -- 8.7   LFT  Basename 09/19/11 0630  PROT 4.2*  ALBUMIN 2.2*  AST 14  ALT 9  ALKPHOS 52  BILITOT 0.1*  BILIDIR --  IBILI --   PT/INR  Basename 09/18/11 2250  LABPROT 14.5  INR 1.11   Hepatitis Panel No results found for this basename: HEPBSAG,HCVAB,HEPAIGM,HEPBIGM in the last 72 hours C-Diff No results found for this basename: CDIFFTOX:3 in the last 72 hours Fecal Lactopherrin No results found for this basename: FECLLACTOFRN in the last 72 hours  Studies/Results: No results found.  Medications:  Scheduled:   . acetaminophen  650 mg Oral Once  . allopurinol  300 mg Oral Daily  . dorzolamide-timolol  1 drop Both Eyes BID  . fluticasone  2 spray Each  Nare Daily  . latanoprost  1 drop Right Eye QHS  . NIFEdipine  30 mg Oral Daily  . pantoprazole (PROTONIX) IV  40 mg Intravenous Q12H  . polyethylene glycol  17 g Oral BID  . DISCONTD: diphenhydrAMINE  25 mg Intravenous Once  . DISCONTD: sodium chloride  3 mL Intravenous Q12H   Continuous:   . sodium chloride 1,000 mL (09/21/11 1657)    Assessment/Plan: 1) Diverticular bleed   If she continues to bleeding and requires more transfusions a surgical consultation will be required.  Plan: 1) Follow HGB. 2) Transfuse if necessary.  LOS: 3 days   Suni Jarnagin D 09/21/2011, 5:44 PM

## 2011-09-22 DIAGNOSIS — K5731 Diverticulosis of large intestine without perforation or abscess with bleeding: Principal | ICD-10-CM

## 2011-09-22 DIAGNOSIS — D62 Acute posthemorrhagic anemia: Secondary | ICD-10-CM

## 2011-09-22 LAB — CBC
HCT: 29.8 % — ABNORMAL LOW (ref 36.0–46.0)
Hemoglobin: 11.5 g/dL — ABNORMAL LOW (ref 12.0–15.0)
MCHC: 34.7 g/dL (ref 30.0–36.0)
Platelets: 111 10*3/uL — ABNORMAL LOW (ref 150–400)
Platelets: 132 10*3/uL — ABNORMAL LOW (ref 150–400)
RDW: 18.5 % — ABNORMAL HIGH (ref 11.5–15.5)
RDW: 18.4 % — ABNORMAL HIGH (ref 11.5–15.5)
WBC: 9.8 10*3/uL (ref 4.0–10.5)

## 2011-09-22 LAB — TYPE AND SCREEN
ABO/RH(D): A POS
Antibody Screen: NEGATIVE
Unit division: 0

## 2011-09-22 LAB — GLUCOSE, CAPILLARY
Glucose-Capillary: 100 mg/dL — ABNORMAL HIGH (ref 70–99)
Glucose-Capillary: 122 mg/dL — ABNORMAL HIGH (ref 70–99)

## 2011-09-22 MED ORDER — SODIUM CHLORIDE 0.9 % IJ SOLN
INTRAMUSCULAR | Status: AC
  Administered 2011-09-22: 10 mL
  Filled 2011-09-22: qty 10

## 2011-09-22 MED ORDER — POLYETHYLENE GLYCOL 3350 17 G PO PACK
17.0000 g | PACK | Freq: Every day | ORAL | Status: DC
  Administered 2011-09-23 – 2011-09-24 (×2): 17 g via ORAL
  Filled 2011-09-22 (×2): qty 1

## 2011-09-22 NOTE — Progress Notes (Signed)
Pt transferred to room 3022, per MD order. Report called to receiving nurse and all questions answered.

## 2011-09-22 NOTE — Progress Notes (Addendum)
[facility]  TEAM 1 - Stepdown/ICU TEAM  Subjective: 78 year-old female brought to the ER after having a syncopal episode following a rectal bleed. Patient states she has been having multiple bloody bowel movements since Tuesday morning 10 AM.  Her presenting Hgb was 9, with an apparent baseline Hgb of 13.    The pt is up to a bedside chair and denies orthostatic symptoms.  She had a bowel movement this morning with a "moderate" amount of red blood in it, but a subsequent movement this afternoon was marked only by older looking clots per her RN.  She denies f/, sob, n/v, or abdom pain.    Objective: Weight change:   Intake/Output Summary (Last 24 hours) at 09/22/11 1419 Last data filed at 09/22/11 1300  Gross per 24 hour  Intake   2220 ml  Output   3200 ml  Net   -980 ml   Blood pressure 122/60, pulse 87, temperature 98.8 F (37.1 C), temperature source Oral, resp. rate 12, height 5\' 2"  (1.575 m), weight 57.9 kg (127 lb 10.3 oz), SpO2 96.00%.  CBG (last 3)   Basename 09/21/11 2328 09/21/11 1941 09/21/11 1152  GLUCAP 100* 125* 120*   Physical Exam:  General: No acute respiratory distress Lungs: Clear to auscultation bilaterally without wheezes or crackles Cardiovascular: Regular rate and rhythm without murmur gallop or rub  Abdomen: Nontender, nondistended, soft, bowel sounds positive, no rebound, no ascites, no appreciable mass Extremities: No significant cyanosis, clubbing, or edema bilateral lower extremities  Lab Results:  [facility] 09/20/11 0405  NA 143  K 3.6  CL 113*  CO2 22  GLUCOSE 94  BUN 23  CREATININE 0.97  CALCIUM 8.3*  MG --  PHOS --   Basename 09/22/11 0700 09/21/11 1606 09/21/11 1458  WBC 9.8 13.3* 12.8*  NEUTROABS -- -- --  HGB 10.3* 10.9* 11.5*  HCT 29.8* 31.4* 33.0*  MCV 86.1 86.3 87.8  PLT 111* 98* 103*   Micro Results: Recent Results (from the past 240 hour(s))  MRSA PCR SCREENING     Status: Normal   Collection Time   09/19/11  2:18 AM      Component Value Range Status Comment   MRSA by PCR NEGATIVE  NEGATIVE  Final    Studies/Results: All recent x-ray/radiology reports have been reviewed in detail.   Medications: I have reviewed the patient's complete medication list.  Assessment/Plan:  Acute GI bleed/BRBPR GI is following - colo reveals pan-diverticula as well as hemorrhoids, but a clear pinpoint source of bleeding could not be identified - if brisk bleeding recurs will need NM bleeding scan and possible IR consult for selective embolization - at present it appears that her bleeding has greatly slowed down or even stopped - she is hemodynamically stable   Anemia from acute blood loss Hgb nadir of 6.4 - has required blood transfusion , with total of 4U PRBC transfused thus far - follow serial CBC - cont to transfuse prn to keep Hgb > 7.0  Syncopal spell Due to hypovolemia / acute blood loss - no sx of orthostasis at present - no further investigation required  History of DVT 2 years ago is off Coumadin but on aspirin  Hx of HTN Not an acute issue - follow as volume becomes more stable  Gout On prophy med tx  Macular degeneration and glaucoma Stable   Dispo stable for transfer to medical bed - pt lives independently so will need to be stable in ADLs prior  to D/C - if hgb stable and she does will w/ PT/OT she may be ready for d/c in 48-72hrs  Alexey Rhoads T. Faraz Ponciano, MD [facility] Office  [phone] Pager [phone]  On-Call/Text Page:      [email or web address]      password [facility]

## 2011-09-22 NOTE — Evaluation (Signed)
Physical Therapy Evaluation Patient Details Name: Cindy Schultz MRN: [id number] DOB: 25-Jul-1923 Today's Date: 09/22/2011  Problem List:  Patient Active Problem List  Diagnoses  . HIP PAIN  . KNEE PAIN  . LOW BACK PAIN  . ANSERINE BURSITIS, LEFT  . LEG PAIN, BILATERAL  . DISLOCATION CLOSED SHOULDER NEC  . TOTAL KNEE FOLLOW-UP  . Personal history of fall  . Abnormality of gait  . GI bleed  . HTN (hypertension)  . Anemia due to blood loss, acute  . Syncopal episodes  . Diverticulosis of colon with hemorrhage    Past Medical History:  Past Medical History  Diagnosis Date  . Macular degeneration   . Glaucoma   . Precancerous changes of the cervix     lesions  . Rectocele   . Diverticulitis   . Lumbar spine pain   . Pain in joints   . Hearing loss   . HTN (hypertension)     cholesterol   Past Surgical History:  Past Surgical History  Procedure Date  . [place]   . Tracheotomy   . Fallen uterus   . Ovarian cyst surgery   . Bladder tac   . Hemmorrhoidectomy   . Met osteostomy   . Left total knee replacement 05/02/04    [name]  . Morton's nueroma   . Elevated metatarsal arch   . Rupture rt groin   . Appendix   . Rectocele repair   . Corneal erosion     cataracts   . Colonoscopy 09/20/2011    Procedure: COLONOSCOPY;  Surgeon: Beryle Beams, MD;  Location: [facility] ENDOSCOPY;  Service: Endoscopy;  Laterality: N/A;    PT Assessment/Plan/Recommendation PT Assessment Clinical Impression Statement: Pt presents with a medical diagnosis of GI bleed with generalized weakness. Pt will benefit from skilled PT in the acute care setting in order to maximize functional mobility prior to d/c PT Recommendation/Assessment: Patient will need skilled PT in the acute care venue PT Problem List: Decreased strength;Decreased activity tolerance;Decreased mobility;Decreased knowledge of use of DME;Decreased safety awareness Barriers to Discharge: Decreased caregiver support Barriers to  Discharge Comments: pt and daughter are discussing SNF upon d/c PT Therapy Diagnosis : Generalized weakness;Difficulty walking PT Plan PT Frequency: Min 3X/week PT Treatment/Interventions: DME instruction;Gait training;Functional mobility training;Therapeutic activities;Therapeutic exercise;Patient/family education PT Recommendation Follow Up Recommendations: Skilled nursing facility;Home health PT;Supervision - Intermittent ([facility] vs SNF pending progress; discussing with family) Equipment Recommended: Rolling walker with 5" wheels PT Goals  Acute Rehab PT Goals PT Goal Formulation: With patient Time For Goal Achievement: 2 weeks Pt will go Supine/Side to Sit: with modified independence PT Goal: Supine/Side to Sit - Progress: Goal set today Pt will go Sit to Supine/Side: with modified independence PT Goal: Sit to Supine/Side - Progress: Goal set today Pt will go Sit to Stand: with modified independence PT Goal: Sit to Stand - Progress: Goal set today Pt will go Stand to Sit: with modified independence PT Goal: Stand to Sit - Progress: Goal set today Pt will Transfer Bed to Chair/Chair to Bed: with modified independence PT Transfer Goal: Bed to Chair/Chair to Bed - Progress: Goal set today Pt will Ambulate: >150 feet;with modified independence;with least restrictive assistive device PT Goal: Ambulate - Progress: Goal set today Pt will Perform Home Exercise Program: Independently PT Goal: Perform Home Exercise Program - Progress: Goal set today  PT Evaluation Precautions/Restrictions  Restrictions Weight Bearing Restrictions: No Prior Functioning  Home Living Lives With: Alone Type of Home: House Home Layout:  One level Home Access: Stairs to enter Entrance Stairs-Rails: None Entrance Stairs-Number of Steps: 2 Bathroom Shower/Tub: Product/process development scientist: Standard Bathroom Accessibility: Yes How Accessible: Accessible via walker Home Adaptive Equipment: Straight  cane;Shower chair with back Prior Function Level of Independence: Independent with basic ADLs;Independent with homemaking with ambulation;Independent with gait;Independent with transfers Able to Take Stairs?: Yes Driving: Yes Vocation: Retired Leisure: Hobbies-yes (Comment) Comments: dancing Cognition Cognition Arousal/Alertness: Awake/alert Overall Cognitive Status: Appears within functional limits for tasks assessed Orientation Level: Oriented X4 Sensation/Coordination Sensation Light Touch: Appears Intact Extremity Assessment RLE Assessment RLE Assessment: Exceptions to [facility] RLE Strength RLE Overall Strength: Deficits RLE Overall Strength Comments: Overall MMT 4/5. Functional deficits LLE Assessment LLE Assessment: Exceptions to [facility] LLE Strength LLE Overall Strength: Deficits LLE Overall Strength Comments: Overall MMT 4/5. Functional deficits Mobility (including Balance) Bed Mobility Bed Mobility: No Transfers Transfers: Yes Sit to Stand: 4: Min assist;With upper extremity assist;From chair/3-in-1 Sit to Stand Details (indicate cue type and reason): Min assist for stability secondary to LE weakness. VC for hand placement for safety Stand to Sit: 4: Min assist;With upper extremity assist;To chair/3-in-1 Stand to Sit Details: VC for hand placement. Pt able to control descent with min assist Ambulation/Gait Ambulation/Gait: Yes Ambulation/Gait Assistance: 4: Min assist Ambulation/Gait Assistance Details (indicate cue type and reason): Min assist for stability. VC throughout for safety and sequencing (pt may possibly benefit from RW next session) Ambulation Distance (Feet): 30 Feet Assistive device: Other (Comment) (IV pole) Gait Pattern: Step-to pattern;Decreased stride length;Decreased hip/knee flexion - right;Decreased hip/knee flexion - left;Decreased trunk rotation Gait velocity: decreased gait speed Stairs: No    Exercise    End of Session PT - End of  Session Equipment Utilized During Treatment: Gait belt Activity Tolerance: Patient tolerated treatment well Patient left: in chair;with call bell in reach Nurse Communication: Mobility status for ambulation;Mobility status for transfers General Behavior During Session: [facility] for tasks performed Cognition: [facility] for tasks performed  Ambrose Finland 09/22/2011, 5:27 PM  09/22/2011 Ambrose Finland DPT PAGER: [phone] OFFICE: [phone]

## 2011-09-22 NOTE — Progress Notes (Signed)
[facility] Progress Note for [facility]  Subjective: Pt without complaint today. Wants to get up to chair. Had 1 BM so far today which contained blood clots mixed with stool.  No transfusions today No fevers, chills.  Objective:  Vital signs in last 24 hours: Temp:  [98.5 F (36.9 C)-99.5 F (37.5 C)] 98.8 F (37.1 C) (04/06 0800) Pulse Rate:  [77-100] 87  (04/06 0800) Resp:  [12-25] 12  (04/06 0800) BP: (102-154)/(53-89) 122/60 mmHg (04/06 0800) SpO2:  [96 %-100 %] 96 % (04/06 0800) Last BM Date: 09/21/11 Gen: awake, alert, NAD HEENT: anicteric, op clear CV: RRR, no mrg Pulm: CTA b/l Abd: soft, NT/ND, +BS throughout Ext: no c/c/e Neuro: nonfocal   Intake/Output from previous day: 04/05 0701 - 04/06 0700 In: 1491 [I.V.:678; Blood:813] Out: 4100 [Urine:4100] Intake/Output this shift: Total I/O In: 50 [I.V.:50] Out: -   Lab Results:  Basename 09/22/11 0700 09/21/11 1606 09/21/11 1458  WBC 9.8 13.3* 12.8*  HGB 10.3* 10.9* 11.5*  HCT 29.8* 31.4* 33.0*  PLT 111* 98* 103*   BMET  Basename 09/20/11 0405  NA 143  K 3.6  CL 113*  CO2 22  GLUCOSE 94  BUN 23  CREATININE 0.97  CALCIUM 8.3*   Colon 09/20/11 -- pandiverticulosis, internal and external hemorrhoids, very small ascending colon polyp not removed.  Assessment / Plan: 1. Stuttering painless LGI bleeding, felt diverticular in nature after recent colonoscopy.   --Small amount of bleeding today, Hgb is relatively stable (10.3, down from 10.9).   --If brisk or more frequent hematochezia, then I would recommend tagged RBC study in attempt to localized bleeding and if + then possible angio for coil embolization.  Other option would be surgical. --q12h Hgb, transfuse as necessary --OOB to chair okay  Principal Problem:  *GI bleed Active Problems:  HTN (hypertension)  Anemia due to blood loss, acute  Syncopal episodes     LOS: 4 days   Annalyssa Thune M  09/22/2011, 10:59 AM

## 2011-09-22 NOTE — Progress Notes (Signed)
Introduced myself as a Clinical biochemist to pt.  Spoke to her about her concerns about insurance.  Provided emotional support.  Please page if needed or requested. Hope Pigeon [phone]  On call pager

## 2011-09-23 ENCOUNTER — Inpatient Hospital Stay (HOSPITAL_COMMUNITY): Payer: Medicare Other

## 2011-09-23 DIAGNOSIS — K922 Gastrointestinal hemorrhage, unspecified: Secondary | ICD-10-CM

## 2011-09-23 LAB — BASIC METABOLIC PANEL
Calcium: 8.4 mg/dL (ref 8.4–10.5)
Creatinine, Ser: 0.99 mg/dL (ref 0.50–1.10)
GFR calc non Af Amer: 51 mL/min — ABNORMAL LOW (ref >90)
Sodium: 139 mEq/L (ref 135–145)

## 2011-09-23 LAB — CBC
MCH: 30 pg (ref 26.0–34.0)
MCHC: 33.7 g/dL (ref 30.0–36.0)
Platelets: 134 10*3/uL — ABNORMAL LOW (ref 150–400)
RDW: 18.3 % — ABNORMAL HIGH (ref 11.5–15.5)

## 2011-09-23 LAB — HEMOGLOBIN AND HEMATOCRIT, BLOOD: HCT: 30.7 % — ABNORMAL LOW (ref 36.0–46.0)

## 2011-09-23 LAB — GLUCOSE, CAPILLARY: Glucose-Capillary: 83 mg/dL (ref 70–99)

## 2011-09-23 MED ORDER — SODIUM CHLORIDE 0.9 % IV SOLN: INTRAVENOUS | Status: DC

## 2011-09-23 MED ORDER — TECHNETIUM TC 99M-LABELED RED BLOOD CELLS IV KIT
22.5000 | PACK | Freq: Once | INTRAVENOUS | Status: AC | PRN
  Administered 2011-09-23: 23 via INTRAVENOUS

## 2011-09-23 NOTE — Progress Notes (Addendum)
Subjective: Patient seen and examined ,had 3 episodes of moderate amount of  bright red rectal bleed this AM .  Objective: Vital signs in last 24 hours: Temp:  [98 F (36.7 C)-98.2 F (36.8 C)] 98.2 F (36.8 C) (04/07 0500) Pulse Rate:  [81-94] 94  (04/07 0500) Resp:  [17-20] 18  (04/07 0500) BP: (123-172)/(54-78) 130/54 mmHg (04/07 0500) SpO2:  [95 %-100 %] 95 % (04/07 0500) Weight change:  Last BM Date: 09/23/11  Intake/Output from previous day: 04/06 0701 - 04/07 0700 In: 2123.3 [P.O.:1680; I.V.:443.3] Out: 2600 [Urine:2600]     Physical Exam: General: Alert, awake, oriented x3, in no acute distress. Heart: Regular rate and rhythm, without murmurs, rubs, gallops. Lungs: Clear to auscultation bilaterally. Abdomen: Soft, nontender, nondistended, positive bowel sounds. Extremities: No clubbing cyanosis or edema with positive pedal pulses. Neuro: Grossly intact, nonfocal.    Lab Results: Results for orders placed during the hospital encounter of 09/18/11 (from the past 24 hour(s))  GLUCOSE, CAPILLARY     Status: Abnormal   Collection Time   09/22/11 12:08 PM      Component Value Range   Glucose-Capillary 153 (*) 70 - 99 (mg/dL)  GLUCOSE, CAPILLARY     Status: Abnormal   Collection Time   09/22/11  3:46 PM      Component Value Range   Glucose-Capillary 113 (*) 70 - 99 (mg/dL)  CBC     Status: Abnormal   Collection Time   09/22/11  3:54 PM      Component Value Range   WBC 11.5 (*) 4.0 - 10.5 (K/uL)   RBC 3.80 (*) 3.87 - 5.11 (MIL/uL)   Hemoglobin 11.5 (*) 12.0 - 15.0 (g/dL)   HCT 33.1 (*) 36.0 - 46.0 (%)   MCV 87.1  78.0 - 100.0 (fL)   MCH 30.3  26.0 - 34.0 (pg)   MCHC 34.7  30.0 - 36.0 (g/dL)   RDW 18.4 (*) 11.5 - 15.5 (%)   Platelets 132 (*) 150 - 400 (K/uL)  CBC     Status: Abnormal   Collection Time   09/23/11  6:30 AM      Component Value Range   WBC 7.9  4.0 - 10.5 (K/uL)   RBC 3.27 (*) 3.87 - 5.11 (MIL/uL)   Hemoglobin 9.8 (*) 12.0 - 15.0 (g/dL)   HCT 29.1  (*) 36.0 - 46.0 (%)   MCV 89.0  78.0 - 100.0 (fL)   MCH 30.0  26.0 - 34.0 (pg)   MCHC 33.7  30.0 - 36.0 (g/dL)   RDW 18.3 (*) 11.5 - 15.5 (%)   Platelets 134 (*) 150 - 400 (K/uL)  BASIC METABOLIC PANEL     Status: Abnormal   Collection Time   09/23/11  6:30 AM      Component Value Range   Sodium 139  135 - 145 (mEq/L)   Potassium 3.6  3.5 - 5.1 (mEq/L)   Chloride 107  96 - 112 (mEq/L)   CO2 24  19 - 32 (mEq/L)   Glucose, Bld 94  70 - 99 (mg/dL)   BUN 12  6 - 23 (mg/dL)   Creatinine, Ser 0.99  0.50 - 1.10 (mg/dL)   Calcium 8.4  8.4 - 10.5 (mg/dL)   GFR calc non Af Amer 51 (*) >90 (mL/min)   GFR calc Af Amer 59 (*) >90 (mL/min)    Studies/Results: No results found.  Medications:    . acetaminophen  650 mg Oral Once  . allopurinol  300  mg Oral Daily  . dorzolamide-timolol  1 drop Both Eyes BID  . fluticasone  2 spray Each Nare Daily  . latanoprost  1 drop Right Eye QHS  . NIFEdipine  30 mg Oral Daily  . polyethylene glycol  17 g Oral Daily  . DISCONTD: pantoprazole (PROTONIX) IV  40 mg Intravenous Q12H  . DISCONTD: polyethylene glycol  17 g Oral BID    acetaminophen, acetaminophen, ondansetron (ZOFRAN) IV, ondansetron, zolpidem     . sodium chloride 20 mL/hr (09/22/11 1432)    Assessment/Plan:  Acute GI bleed/BRBPR  -Patient is actively bleeding she is hemodynamically stable at the moment but at great risk of instability ,will transfer back to SDU .Monitor H&H Q8 H ,transfuse as needed .HB dropped from 11 to 9 .  . - Colo reveals pan-diverticula as well as hemorrhoids, but a clear pinpoint source of bleeding could not be identified - -spoke to Dr Hilarie Fredrickson will order  NM bleeding scan ,will keep NPO now for the test but can put back on clear liquids afterwards as per Dr Hilarie Fredrickson. Anemia from acute blood loss  -She was transfused with total of 4U PRBC transfused thus far - follow serial CBC - cont to transfuse prn to keep Hgb > 7.0  Syncopal spell  Due to hypovolemia /  acute blood loss - resolved  History of DVT 2 years ago  is off Coumadin ,ASA on hold   Hx of HTN  Stable ,hold antihypertensives given active bleed. Gout  On allopurinol Macular degeneration and glaucoma  Stable  Dispo  Transfer back to SDU.       LOS: 5 days   Cindy Schultz 09/23/2011, 11:35 AM

## 2011-09-23 NOTE — Progress Notes (Signed)
[facility] Progress Note for [facility]  Subjective: 3 episodes today of hematochezia, 2 moderate volume.  Did not become unstable hemodynamically Feels well without pain. Had tagged RBC neg.  Objective:  Vital signs in last 24 hours: Temp:  [97.6 F (36.4 C)-98.2 F (36.8 C)] 97.6 F (36.4 C) (04/07 1751) Pulse Rate:  [77-94] 77  (04/07 1751) Resp:  [17-18] 17  (04/07 1751) BP: (123-130)/(54-72) 125/72 mmHg (04/07 1751) SpO2:  [95 %-99 %] 99 % (04/07 1751) Last BM Date: 09/23/11 Gen: awake, alert, NAD  HEENT: anicteric, op clear  CV: RRR, no mrg  Pulm: CTA b/l  Abd: soft, NT/ND, +BS throughout  Ext: no c/c/e  Neuro: nonfocal   Intake/Output from previous day: 04/06 0701 - 04/07 0700 In: 2123.3 [P.O.:1680; I.V.:443.3] Out: 2600 [Urine:2600] Intake/Output this shift:    Lab Results:  Basename 09/23/11 1311 09/23/11 0630 09/22/11 1554 09/22/11 0700  WBC -- 7.9 11.5* 9.8  HGB 10.4* 9.8* 11.5* --  HCT 30.7* 29.1* 33.1* --  PLT -- 134* 132* 111*   BMET  Basename 09/23/11 0630  NA 139  K 3.6  CL 107  CO2 24  GLUCOSE 94  BUN 12  CREATININE 0.99  CALCIUM 8.4   LFT No results found for this basename: PROT,ALBUMIN,AST,ALT,ALKPHOS,BILITOT,BILIDIR,IBILI in the last 72 hours PT/INR No results found for this basename: LABPROT:2,INR:2 in the last 72 hours Hepatitis Panel No results found for this basename: HEPBSAG,HCVAB,HEPAIGM,HEPBIGM in the last 72 hours  Studies/Results: Nm Gi Blood Loss  09/23/2011  *RADIOLOGY REPORT*  Clinical Data: Rectal bleeding  NUCLEAR MEDICINE GASTROINTESTINAL BLEEDING STUDY  Technique:  Sequential abdominal images were obtained following intravenous administration of Tc-96m labeled red blood cells.  Radiopharmaceutical: 23MILLI CURIE ULTRATAG TECHNETIUM TC 4M- LABELED RED BLOOD CELLS IV KIT  Comparison: None  Findings: Following the intravenous administration of the radiopharmaceutical dynamic acquisition of images was  performed at 1 minute per frame for 120 minutes.  No abnormal focus of increased radiotracer uptake is identified within the field of view to suggest active lower GI bleed.  There is normal physiologic distribution of the radiopharmaceutical within the blood pool, liver and urinary bladder.  IMPRESSION:  1.  No specific features identified to suggest active GI bleeding.  Original Report Authenticated By: Angelita Ingles, M.D.     Assessment / Plan: 1. Stuttering painless LGI bleeding, felt diverticular in nature after recent colonoscopy.  --Some bleeding today, Hgb is stable and in fact up some.  Neg tagged study argues against ongoing bleeding now --q12h Hgb, transfuse as necessary  --transferring back to step down for closer monitoring --supportive care  Principal Problem:  *GI bleed Active Problems:  HTN (hypertension)  Anemia due to blood loss, acute  Syncopal episodes  Diverticulosis of colon with hemorrhage     LOS: 5 days   Brenda Samano M  09/23/2011, 6:37 PM

## 2011-09-24 LAB — GLUCOSE, CAPILLARY
Glucose-Capillary: 113 mg/dL — ABNORMAL HIGH (ref 70–99)
Glucose-Capillary: 98 mg/dL (ref 70–99)
Glucose-Capillary: 91 mg/dL (ref 70–99)

## 2011-09-24 LAB — BASIC METABOLIC PANEL
BUN: 10 mg/dL (ref 6–23)
Creatinine, Ser: 0.92 mg/dL (ref 0.50–1.10)
GFR calc Af Amer: 64 mL/min — ABNORMAL LOW (ref >90)
GFR calc non Af Amer: 55 mL/min — ABNORMAL LOW (ref >90)

## 2011-09-24 LAB — CBC
HCT: 29.5 % — ABNORMAL LOW (ref 36.0–46.0)
HCT: 29.1 % — ABNORMAL LOW (ref 36.0–46.0)
Hemoglobin: 9.9 g/dL — ABNORMAL LOW (ref 12.0–15.0)
Hemoglobin: 9.6 g/dL — ABNORMAL LOW (ref 12.0–15.0)
MCH: 30 pg (ref 26.0–34.0)
MCHC: 33.6 g/dL (ref 30.0–36.0)
MCHC: 33 g/dL (ref 30.0–36.0)
MCV: 89.4 fL (ref 78.0–100.0)
MCV: 91.5 fL (ref 78.0–100.0)
Platelets: 163 10*3/uL (ref 150–400)
RBC: 3.3 MIL/uL — ABNORMAL LOW (ref 3.87–5.11)
RDW: 18 % — ABNORMAL HIGH (ref 11.5–15.5)
WBC: 8.1 10*3/uL (ref 4.0–10.5)

## 2011-09-24 LAB — HEMOGLOBIN AND HEMATOCRIT, BLOOD
HCT: 28.4 % — ABNORMAL LOW (ref 36.0–46.0)
Hemoglobin: 9.6 g/dL — ABNORMAL LOW (ref 12.0–15.0)

## 2011-09-24 MED ORDER — POLYETHYLENE GLYCOL 3350 17 G PO PACK
17.0000 g | PACK | Freq: Two times a day (BID) | ORAL | Status: DC
  Administered 2011-09-24 – 2011-09-26 (×3): 17 g via ORAL
  Filled 2011-09-24 (×5): qty 1

## 2011-09-24 NOTE — Plan of Care (Signed)
Problem: Phase II Progression Outcomes Goal: No active bleeding Outcome: Progressing Maroon stool less frequent. Last stool more brown.

## 2011-09-24 NOTE — Progress Notes (Signed)
[facility] [place] TEAM 1 - Stepdown/ICU TEAM  Subjective: 78 year-old female brought to the ER after having a syncopal episode following a rectal bleed. Patient states she has been having multiple bloody bowel movements since Tuesday morning 10 AM.  Her presenting Hgb was 9, with an apparent baseline Hgb of 13.    Had recurrent hematochezia in the past 24 hours and was subsequently sent back to the stepdown unit on 04/07/201, despite stable Hgb and negative Nuc Med RBC scan. Previous red lower GI bleeding has now changed over to maroon colored stools. She denies abdominal pain. Verbalizes concerns over issues with chronic constipation.  Objective: Weight change:   Intake/Output Summary (Last 24 hours) at 09/24/11 1119 Last data filed at 09/24/11 0600  Gross per 24 hour  Intake    900 ml  Output      0 ml  Net    900 ml   Blood pressure 114/59, pulse 81, temperature 97.6 F (36.4 C), temperature source Oral, resp. rate 13, height 5\' 2"  (1.575 m), weight 57.9 kg (127 lb 10.3 oz), SpO2 98.00%.  CBG (last 3)   Basename 09/24/11 0737 09/24/11 0341 09/24/11 0015  GLUCAP 91 98 113*   Physical Exam:  General: No acute respiratory distress Lungs: Clear to auscultation bilaterally without wheezes or crackles Cardiovascular: Regular rate and rhythm without murmur gallop or rub  Abdomen: Nontender, nondistended, soft, bowel sounds positive, no rebound, no ascites, no appreciable mass Extremities: No significant cyanosis, clubbing, or edema bilateral lower extremities  Lab Results:  Basename 09/24/11 0428 09/23/11 0630  NA 142 139  K 3.6 3.6  CL 109 107  CO2 24 24  GLUCOSE 97 94  BUN 10 12  CREATININE 0.92 0.99  CALCIUM 8.6 8.4  MG -- --  PHOS -- --    Basename 09/24/11 0428 09/23/11 2130 09/23/11 1311 09/23/11 0630 09/22/11 1554  WBC 8.1 -- -- 7.9 11.5*  NEUTROABS -- -- -- -- --  HGB 9.9* 10.9* 10.4* -- --  HCT 29.5* 32.0* 30.7* -- --  MCV 89.4 -- -- 89.0 87.1    PLT 163 -- -- 134* 132*   Micro Results: Recent Results (from the past 240 hour(s))  MRSA PCR SCREENING     Status: Normal   Collection Time   09/19/11  2:18 AM      Component Value Range Status Comment   MRSA by PCR NEGATIVE  NEGATIVE  Final    Studies/Results: All recent x-ray/radiology reports have been reviewed in detail.   Medications: I have reviewed the patient's complete medication list.  Assessment/Plan:  Acute GI bleed/BRBPR *GI is following -suspect recurrent bleeding related to stuttering oozing from diverticula *colonoscopy reveals pan-diverticula as well as hemorrhoids, but a clear pinpoint source of bleeding could not be identified  *Nuclear medicine bleeding scan negative  Anemia from acute blood loss *Hgb nadir of 6.4 - has required blood transfusion , with total of 4U PRBC transfused thus far *despite recent bleeding hemoglobin has remained stable *Continue to follow serial CBC - transfuse prn to keep Hgb > 7.0  Syncopal spell *Due to hypovolemia / acute blood loss - no sx of orthostasis at present - no further investigation required  History of DVT 2 years ago *Off Coumadin but on aspirin  Hx of HTN *Not an acute issue - follow as volume becomes more stable  Gout *On prophy med tx  Macular degeneration and glaucoma Stable   Dispo *Remain in stepdown unit for an  additional 24 hours to monitor for any increase in bleeding  Debbora Lacrosse, ANP [facility] Office  [phone] Pager [phone]  On-Call/Text Page:      [email or web address]      password [email or web address]  I have personally examined this patient and reviewed the entire database. I have reviewed the above note, made any necessary editorial changes, and agree with its content.  Cherene Altes, MD [facility]

## 2011-09-24 NOTE — Progress Notes (Signed)
  Subjective: Feeling better today  Did not become unstable hemodynamically 1 BM this morning - no blood. No N/V or abd pain. Had tagged RBC neg.  Objective:  Vital signs in last 24 hours: Temp:  [97.1 F (36.2 C)-98.5 F (36.9 C)] 97.7 F (36.5 C) (04/08 1600) Pulse Rate:  [77-92] 81  (04/08 0747) Resp:  [13-28] 13  (04/08 0747) BP: (114-158)/(52-74) 149/74 mmHg (04/08 1600) SpO2:  [97 %-100 %] 98 % (04/08 0747) Last BM Date: 09/24/11 (freq stools)  Gen: awake, alert, NAD  HEENT: anicteric, op clear  CV: RRR, no mrg  Pulm: CTA b/l  Abd: soft, NT/ND, +BS throughout  Ext: no c/c/e  Neuro: nonfocal   Intake/Output from previous day: 04/07 0701 - 04/08 0700 In: 975 [I.V.:975] Out: -  Intake/Output this shift: Total I/O In: 525 [I.V.:525] Out: -   Lab Results:  Basename 09/24/11 1134 09/24/11 0428 09/23/11 2130 09/23/11 0630 09/22/11 1554  WBC -- 8.1 -- 7.9 11.5*  HGB 9.6* 9.9* 10.9* -- --  HCT 28.4* 29.5* 32.0* -- --  PLT -- 163 -- 134* 132*   BMET  Basename 09/24/11 0428 09/23/11 0630  NA 142 139  K 3.6 3.6  CL 109 107  CO2 24 24  GLUCOSE 97 94  BUN 10 12  CREATININE 0.92 0.99  CALCIUM 8.6 8.4   LFT No results found for this basename: PROT,ALBUMIN,AST,ALT,ALKPHOS,BILITOT,BILIDIR,IBILI in the last 72 hours PT/INR No results found for this basename: LABPROT:2,INR:2 in the last 72 hours Hepatitis Panel No results found for this basename: HEPBSAG,HCVAB,HEPAIGM,HEPBIGM in the last 72 hours  Studies/Results: Nm Gi Blood Loss  09/23/2011  *RADIOLOGY REPORT*  Clinical Data: Rectal bleeding  NUCLEAR MEDICINE GASTROINTESTINAL BLEEDING STUDY  Technique:  Sequential abdominal images were obtained following intravenous administration of Tc-20m labeled red blood cells.  Radiopharmaceutical: 23MILLI CURIE ULTRATAG TECHNETIUM TC 65M- LABELED RED BLOOD CELLS IV KIT  Comparison: None  Findings: Following the intravenous administration of the radiopharmaceutical dynamic  acquisition of images was performed at 1 minute per frame for 120 minutes.  No abnormal focus of increased radiotracer uptake is identified within the field of view to suggest active lower GI bleed.  There is normal physiologic distribution of the radiopharmaceutical within the blood pool, liver and urinary bladder.  IMPRESSION:  1.  No specific features identified to suggest active GI bleeding.  Original Report Authenticated By: Angelita Ingles, M.D.     Assessment / Plan: 1. Stuttering painless LGI bleeding, felt diverticular in nature after recent colonoscopy.  -- Some bleeding yesterday, none today.  Hgb is stable in 9's- slowly trending down.   -- Neg tagged study argues against ongoing bleeding now -- q12h Hgb, transfuse as necessary  -- Surgical evaluation if keeps dropping Hb and keeps having bloody BM.  Principal Problem:  *GI bleed Active Problems:  HTN (hypertension)  Anemia due to blood loss, acute  Syncopal episodes  Diverticulosis of colon with hemorrhage Patient seen and examined with Dr. Criss Alvine. Agree with above mentioned plans.     LOS: 6 days   Cindy Schultz,Cindy Schultz  09/24/2011, 5:13 PM

## 2011-09-24 NOTE — Evaluation (Signed)
Occupational Therapy Evaluation Patient Details Name: Cindy Schultz MRN: [id number] DOB: 1924/04/16 Today's Date: 09/24/2011  Problem List:  Patient Active Problem List  Diagnoses  . HIP PAIN  . KNEE PAIN  . LOW BACK PAIN  . ANSERINE BURSITIS, LEFT  . LEG PAIN, BILATERAL  . DISLOCATION CLOSED SHOULDER NEC  . TOTAL KNEE FOLLOW-UP  . Personal history of fall  . Abnormality of gait  . GI bleed  . HTN (hypertension)  . Anemia due to blood loss, acute  . Syncopal episodes  . Diverticulosis of colon with hemorrhage    Past Medical History:  Past Medical History  Diagnosis Date  . Macular degeneration   . Glaucoma   . Precancerous changes of the cervix     lesions  . Rectocele   . Diverticulitis   . Lumbar spine pain   . Pain in joints   . Hearing loss   . HTN (hypertension)     cholesterol   Past Surgical History:  Past Surgical History  Procedure Date  . [place]   . Tracheotomy   . Fallen uterus   . Ovarian cyst surgery   . Bladder tac   . Hemmorrhoidectomy   . Met osteostomy   . Left total knee replacement 05/02/04    [name]  . Morton's nueroma   . Elevated metatarsal arch   . Rupture rt groin   . Appendix   . Rectocele repair   . Corneal erosion     cataracts   . Colonoscopy 09/20/2011    Procedure: COLONOSCOPY;  Surgeon: Beryle Beams, MD;  Location: [facility] ENDOSCOPY;  Service: Endoscopy;  Laterality: N/A;    OT Assessment/Plan/Recommendation OT Assessment Clinical Impression Statement: Pt presents to OT with decreased I with ADL activity and must be I to return home as she does not have 24/7 care.  Pt will benefit from OT to increase I with ADL activity and return to PLOF.  OT Recommendation/Assessment: Patient will need skilled OT in the acute care venue OT Problem List: Decreased strength;Decreased safety awareness Barriers to Discharge: Decreased caregiver support OT Therapy Diagnosis : Generalized weakness OT Plan OT Frequency: Min 2X/week OT  Treatment/Interventions: Self-care/ADL training OT Recommendation Follow Up Recommendations: Skilled nursing facility Individuals Consulted Consulted and Agree with Results and Recommendations: Patient Family Member Consulted: daughter OT Goals Acute Rehab OT Goals OT Goal Formulation: With patient ADL Goals Pt Will Perform Grooming: with supervision;Standing at sink ADL Goal: Grooming - Progress: Goal set today Pt Will Transfer to Toilet: with supervision;Comfort height toilet ADL Goal: Toilet Transfer - Progress: Goal set today  OT Evaluation Precautions/Restrictions  Restrictions Weight Bearing Restrictions: No Prior Functioning Home Living Lives With: Alone Type of Home: House Home Layout: One level Home Access: Stairs to enter Entrance Stairs-Rails: None Entrance Stairs-Number of Steps: 2 Bathroom Shower/Tub: Product/process development scientist: Standard Bathroom Accessibility: Yes How Accessible: Accessible via walker Home Adaptive Equipment: Straight cane;Shower chair with back Prior Function Level of Independence: Independent with basic ADLs;Independent with homemaking with ambulation;Independent with gait;Independent with transfers Driving: Yes Vocation: Retired ADL ADL Grooming: Performed;Brushing hair Where Assessed - Grooming: Other (comment) (standing in front of chair) Upper Body Bathing: Simulated;Set up Where Assessed - Upper Body Bathing: Sitting, chair;Unsupported Lower Body Bathing: Simulated;Minimal assistance Where Assessed - Lower Body Bathing: Sit to stand from chair Upper Body Dressing: Simulated;Set up Where Assessed - Upper Body Dressing: Sitting, chair;Unsupported Lower Body Dressing: Performed;Minimal assistance Where Assessed - Lower Body Dressing: Sit to stand from  chair Toilet Transfer: Minimal assistance Toilet Transfer Method: Stand pivot Toilet Transfer Equipment: Bedside commode Toileting - Clothing Manipulation:  Simulated;Minimal assistance Where Assessed - Camera operator Manipulation: Standing Toileting - Hygiene: Simulated;Minimal assistance Where Assessed - Toileting Hygiene: Standing Tub/Shower Transfer: Not assessed Vision/Perception  Vision - History Baseline Vision: No visual deficits Cognition Cognition Arousal/Alertness: Awake/alert Overall Cognitive Status: Appears within functional limits for tasks assessed Orientation Level: Oriented X4    Extremity Assessment RUE Assessment RUE Assessment: Within Functional Limits LUE Assessment LUE Assessment: Within Functional Limits Mobility  Bed Mobility Bed Mobility: No Transfers Sit to Stand: 4: Min assist;With upper extremity assist;From chair/3-in-1 Stand to Sit: 4: Min assist;With upper extremity assist;To chair/3-in-1    End of Session OT - End of Session Activity Tolerance: Patient tolerated treatment well Patient left: in chair;with call bell in reach;with family/visitor present General Behavior During Session: [facility] for tasks performed Cognition: [facility] for tasks performed   Ferris, Thereasa Parkin 09/24/2011, 3:10 PM

## 2011-09-25 LAB — CBC
HCT: 26.9 % — ABNORMAL LOW (ref 36.0–46.0)
Hemoglobin: 9 g/dL — ABNORMAL LOW (ref 12.0–15.0)
MCV: 91.5 fL (ref 78.0–100.0)
Platelets: 182 10*3/uL (ref 150–400)
RBC: 2.94 MIL/uL — ABNORMAL LOW (ref 3.87–5.11)
WBC: 6.9 10*3/uL (ref 4.0–10.5)

## 2011-09-25 MED ORDER — OXYBUTYNIN CHLORIDE ER 5 MG PO TB24
5.0000 mg | ORAL_TABLET | Freq: Every day | ORAL | Status: DC
  Administered 2011-09-25 – 2011-09-26 (×2): 5 mg via ORAL
  Filled 2011-09-25 (×2): qty 1

## 2011-09-25 NOTE — Progress Notes (Signed)
Physical Therapy Treatment Patient Details Name: Cindy Schultz MRN: [id number] DOB: 1924-01-03 Today's Date: 09/25/2011  PT Assessment/Plan  PT - Assessment/Plan Comments on Treatment Session: Pt tolerated ambulation well with a gait speed of 0.72ft/sec and increased ambulation distance.  Pt has decreased LE strength due to inactivity.   PT Plan: Discharge plan remains appropriate PT Frequency: Min 3X/week Follow Up Recommendations: Skilled nursing facility;Home health PT;Supervision - Intermittent Equipment Recommended: Defer to next venue PT Goals  Acute Rehab PT Goals PT Goal Formulation: With patient Time For Goal Achievement: 2 weeks Pt will go Sit to Supine/Side: with modified independence PT Goal: Sit to Supine/Side - Progress: Progressing toward goal Pt will go Sit to Stand: with modified independence PT Goal: Sit to Stand - Progress: Progressing toward goal Pt will go Stand to Sit: with modified independence PT Goal: Stand to Sit - Progress: Progressing toward goal Pt will Ambulate: >150 feet;with modified independence;with least restrictive assistive device PT Goal: Ambulate - Progress: Progressing toward goal  PT Treatment Precautions/Restrictions  Precautions Precautions: Fall Restrictions Weight Bearing Restrictions: No Mobility (including Balance) Bed Mobility Bed Mobility: Yes Sit to Supine: 5: Supervision Sit to Supine - Details (indicate cue type and reason): Pt required supervision for safety. Scooting to [facility]: 6: Modified independent (Device/Increase time) Transfers Transfers: Yes Sit to Stand: Other (comment);From chair/3-in-1;With upper extremity assist;With armrests (Min guard) Sit to Stand Details (indicate cue type and reason): Pt required min assist for safety and VC for hand placement. Stand to Sit: Other (comment);To bed;With upper extremity assist (Min guard) Stand to Sit Details: Pt required min guard assist to control descent onto  bed. Ambulation/Gait Ambulation/Gait: Yes Ambulation/Gait Assistance: 4: Min assist Ambulation/Gait Assistance Details (indicate cue type and reason): Min assist for safety. Ambulation Distance (Feet): 100 Feet Assistive device: Rolling walker Gait Pattern: Step-to pattern;Decreased stride length;Decreased hip/knee flexion - right;Decreased hip/knee flexion - left;Decreased trunk rotation Gait velocity: Decreased gait speed: 0.29ft/sec Stairs: No  Posture/Postural Control Posture/Postural Control: No significant limitations Exercise    End of Session PT - End of Session Equipment Utilized During Treatment: Gait belt Activity Tolerance: Patient tolerated treatment well Patient left: in bed;with call bell in reach;Other (comment) (Nurse present) General Behavior During Session: [facility] for tasks performed Cognition: [facility] for tasks performed  Cherida, [place] SPT 09/25/2011, 12:14 PM

## 2011-09-25 NOTE — Progress Notes (Signed)
Occupational Therapy Treatment Patient Details Name: ANAHID MUSER MRN: [id number] DOB: 1923-10-09 Today's Date: 09/25/2011  OT Assessment/Plan OT Assessment/Plan Comments on Treatment Session: Pt did well, but wanted to go back to bed due to fatigue OT Plan: Discharge plan remains appropriate OT Frequency: Min 2X/week Follow Up Recommendations: Skilled nursing facility Equipment Recommended: Rolling walker with 5" wheels OT Goals ADL Goals ADL Goal: Grooming - Progress: Progressing toward goals ADL Goal: Toilet Transfer - Progress: Progressing toward goals  OT Treatment Precautions/Restrictions  Precautions Precautions: Fall Restrictions Weight Bearing Restrictions: No   ADL ADL Grooming: Wash/dry hands Where Assessed - Grooming: Standing at sink Upper Body Bathing: Minimal assistance Upper Body Bathing Details (indicate cue type and reason): min A for balance Lower Body Bathing: Performed;Minimal assistance Lower Body Bathing Details (indicate cue type and reason): donning shoes Lower Body Dressing: Minimal assistance;Performed Where Assessed - Lower Body Dressing: Sit to stand from bed Toilet Transfer: Performed;Minimal assistance Toilet Transfer Method: Counselling psychologist: Comfort height toilet Toileting - Clothing Manipulation: Performed;Minimal assistance Toileting - Clothing Manipulation Details (indicate cue type and reason): min a for balance Where Assessed - Camera operator Manipulation: Standing Toileting - Hygiene: Minimal assistance Where Assessed - Toileting Hygiene: Standing Tub/Shower Transfer: Performed;Minimal assistance Ambulation Related to ADLs: needed min A for balance.  Pt had to get to bathroom quickly . Did not use walker.  Pt would benefit from a walker Mobility  Bed Mobility Bed Mobility: Yes Sit to Supine: 5: Supervision Sit to Supine - Details (indicate cue type and reason): Pt required supervision for  safety. Scooting to [facility]: 5: Supervision Transfers Sit to Stand: Other (comment);From chair/3-in-1;With upper extremity assist;With armrests;4: Min assist (Min guard) Sit to Stand Details (indicate cue type and reason): Pt required min assist for safety and VC for hand placement. Stand to Sit: Other (comment);To bed;With upper extremity assist;To toilet;4: Min assist (Min guard) Stand to Sit Details: Pt required min guard assist to control descent onto bed.     End of Session OT - End of Session Activity Tolerance: Patient tolerated treatment well Patient left: in bed General Behavior During Session: [facility] for tasks performed Cognition: [facility] for tasks performed  Sirenity Shew, Thereasa Parkin  09/25/2011, 2:54 PM

## 2011-09-25 NOTE — Progress Notes (Addendum)
Clinical Social Work Department CLINICAL SOCIAL WORK PLACEMENT NOTE 09/25/2011  Patient:  KARLYE, DIMMOCK  Account Number:  [id number] Admit date:  09/18/2011  Clinical Social Worker:  [employer]  Date/time:  09/24/2011 03:00 PM  Clinical Social Work is seeking post-discharge placement for this patient at the following level of care:   [facility]   (*CSW will update this form in Epic as items are completed)   09/24/2011  Patient/family provided with [facility] Department of Clinical Social Work's list of facilities offering this level of care within the geographic area requested by the patient (or if unable, by the patient's family).  09/24/2011  Patient/family informed of their freedom to choose among providers that offer the needed level of care, that participate in Medicare, Medicaid or managed care program needed by the patient, have an available bed and are willing to accept the patient.  09/24/2011  Patient/family informed of [facility]' ownership interest in [facility], as well as of the fact that they are under no obligation to receive care at this facility.  PASARR submitted to EDS on 09/25/2011 PASARR number received from EDS on   FL2 transmitted to all facilities in geographic area requested by pt/family on  09/25/2011 FL2 transmitted to all facilities within larger geographic area on   Patient informed that his/her managed care company has contracts with or will negotiate with  certain facilities, including the following:     Patient/family informed of bed offers received:   Patient chooses bed at [facility] Physician recommends and patient chooses bed at    Patient to be transferred to  on   Patient to be transferred to facility by   The following physician request were entered in Epic:   Additional Comments: Patient is open to [facility] prefer[facility].  Dorathy Kinsman, [place]  .09/25/2011 1104am

## 2011-09-25 NOTE — Progress Notes (Signed)
[facility] [place] TEAM 1 - Stepdown/ICU TEAM  Subjective: 78 year-old female brought to the ER after having a syncopal episode following a rectal bleed. Patient states she has been having multiple bloody bowel movements since Tuesday morning 10 AM.  Her presenting Hgb was 9, with an apparent baseline Hgb of 13.    No further red or maroon hematochezia. Only old blood noted in stool and very infrequent. Continues with abdominal pain. Has tolerated clear liquid diet  Objective: Weight change:   Intake/Output Summary (Last 24 hours) at 09/25/11 1122 Last data filed at 09/25/11 0500  Gross per 24 hour  Intake    530 ml  Output      0 ml  Net    530 ml   Blood pressure 146/71, pulse 89, temperature 97.4 F (36.3 C), temperature source Oral, resp. rate 12, height 5\' 2"  (1.575 m), weight 57.9 kg (127 lb 10.3 oz), SpO2 100.00%.  CBG (last 3)   Basename 09/24/11 0737 09/24/11 0341 09/24/11 0015  GLUCAP 91 98 113*   Physical Exam:  General: No acute respiratory distress Lungs: Clear to auscultation bilaterally without wheezes or crackles Cardiovascular: Regular rate and rhythm without murmur gallop or rub  Abdomen: Nontender, nondistended, soft, bowel sounds positive, no rebound, no ascites, no appreciable mass Extremities: No significant cyanosis, clubbing, or edema bilateral lower extremities  Lab Results:  Basename 09/24/11 0428 09/23/11 0630  NA 142 139  K 3.6 3.6  CL 109 107  CO2 24 24  GLUCOSE 97 94  BUN 10 12  CREATININE 0.92 0.99  CALCIUM 8.6 8.4  MG -- --  PHOS -- --    Basename 09/25/11 0436 09/24/11 1957 09/24/11 1134 09/24/11 0428  WBC 6.9 8.2 -- 8.1  NEUTROABS -- -- -- --  HGB 9.0* 9.6* 9.6* --  HCT 26.9* 29.1* 28.4* --  MCV 91.5 91.5 -- 89.4  PLT 182 196 -- 163   Micro Results: Recent Results (from the past 240 hour(s))  MRSA PCR SCREENING     Status: Normal   Collection Time   09/19/11  2:18 AM      Component Value Range Status Comment   MRSA by PCR NEGATIVE  NEGATIVE  Final    Studies/Results: All recent x-ray/radiology reports have been reviewed in detail.   Medications: I have reviewed the patient's complete medication list.  Assessment/Plan:  Acute GI bleed/BRBPR *GI is following -suspect recurrent bleeding related to stuttering oozing from diverticula *colonoscopy reveals pan-diverticula as well as hemorrhoids, but a clear pinpoint source of bleeding could not be identified  *Nuclear medicine bleeding scan negative *if hemoglobin continues to drop or he develops bleeding will need to have surgical consultation and evaluation; surgical intervention likely limited since we have been unable to focalize region of bleeding *advanced to solid diet  Anemia from acute blood loss *Hgb nadir of 6.4 - has required blood transfusion , with total of 4U PRBC transfused thus far *despite recent bleeding hemoglobin has remained stable *Continue to follow serial CBC - transfuse prn to keep Hgb > 7.0  Syncopal spell *Due to hypovolemia / acute blood loss - no sx of orthostasis at present - no further investigation required  History of DVT 2 years ago *Off Coumadin but on aspirin  Hx of HTN *Not an acute issue - follow as volume becomes more stable  Gout *On prophy med tx  Macular degeneration and glaucoma Stable   Dispo *transfer to non-telemetry floor * anticipate skilled nursing facility  placement at discharge  Debbora Lacrosse, [place] [facility] Office  [phone] Pager [phone]  On-Call/Text Page:      [email or web address]      password [email or web address]   I have examined the patient and reviewed the chart. I agree with the above note.   Debbe Odea, MD [phone]

## 2011-09-25 NOTE — Progress Notes (Signed)
Clinical Social Work Department BRIEF PSYCHOSOCIAL ASSESSMENT 09/25/2011  Patient:  Cindy Schultz, Cindy Schultz     Account Number:  [id number]     Admit date:  09/18/2011  Clinical Social Worker:  [employer]  Date/Time:  09/24/2011 04:00 PM  Referred by:  Physician  Date Referred:  09/24/2011 Referred for  SNF Placement   Other Referral:   Interview type:  Patient Other interview type:   patient friend at bedside during interview at pt request    PSYCHOSOCIAL DATA Living Status:  ALONE Admitted from facility:   Level of care:  Independent Living Primary support name:  joyce mcgehee Primary support relationship to patient:  CHILD, ADULT Degree of support available:   strong,    Patient also shared that her sister lives beside patient, and pt daughter assists as needed    CURRENT CONCERNS Current Concerns  Post-Acute Placement   Other Concerns:    SOCIAL WORK ASSESSMENT / PLAN CSW met with patient at bedside to discuss patient current living environment and pt discharge plans. Pt shared she currently lives alone with family next door. Pt stated that she had gone to rehab before for a knee replacement and then transitioned to assisted living when for further rehab and assistance before returning home.    CSW provided emotional support and active listening as patient stated she understands she needs rehab but is motivated  Pt agreed to a snf placement for short term rehab as recomended by PT/OT evaluations.    Patient asked CSW to speak with her regarding bed offers and then talk to patient daughter with patient once information is gathered.   Assessment/plan status:  Psychosocial Support/Ongoing Assessment of Needs Other assessment/ plan:   and discharge planing.   Information/referral to community resources:   snf list    PATIENT'S/FAMILY'S RESPONSE TO PLAN OF CARE: Pt is very appreciative of CSW concern and support. Pt thanked csw for beginning snf search for rehab  and discussing with pt daughter along with pt. Please see placement note following.        Dorathy Kinsman, [place] 09/24/2011  15:00

## 2011-09-25 NOTE — Progress Notes (Addendum)
Pt to be TX to 5502, VSS, called report

## 2011-09-25 NOTE — Progress Notes (Signed)
09/25/11 NSG 1400 78 yo female transferred to 5502 from 3300 after being admitted on 4/2 with syncopal episode with GI Bleed.  Pt. Was from home alone, needing assistance with ADL.  PT recommending H/H vs SNF and OT recommending SNF.  Pt. States SW, daughter and POA spokes to meet today to discuss SNF placement.  Pt. A & O x 4, up with one assist, skin intact except skin tear to right hand with dressing.  Oriented to unit,  placed on bed alarm and instructed to call for assistance.  POC to monitor H/H and monitor bleeding.  Will continue to monitor.  Alphonzo Lemmings, RN

## 2011-09-25 NOTE — Progress Notes (Signed)
Patient ID: Cindy Schultz, female   DOB: 1923-10-06, 78 y.o.   MRN: [id number] Subjective: Passing some small amount of old blood, per the patient.  Objective: Vital signs in last 24 hours: Temp:  [97.6 F (36.4 C)-98.5 F (36.9 C)] 98 F (36.7 C) (04/09 0407) Pulse Rate:  [81-94] 89  (04/09 0407) Resp:  [12-25] 12  (04/09 0407) BP: (114-163)/(52-74) 130/52 mmHg (04/09 0407) SpO2:  [98 %-100 %] 100 % (04/09 0407) Last BM Date: 09/24/11  Intake/Output from previous day: 04/08 0701 - 04/09 0700 In: 755 [P.O.:120; I.V.:635] Out: -  Intake/Output this shift:    General appearance: alert and no distress GI: soft, non-tender; bowel sounds normal; no masses,  no organomegaly  Lab Results:  Basename 09/25/11 0436 09/24/11 1957 09/24/11 1134 09/24/11 0428  WBC 6.9 8.2 -- 8.1  HGB 9.0* 9.6* 9.6* --  HCT 26.9* 29.1* 28.4* --  PLT 182 196 -- 163   BMET  Basename 09/24/11 0428 09/23/11 0630  NA 142 139  K 3.6 3.6  CL 109 107  CO2 24 24  GLUCOSE 97 94  BUN 10 12  CREATININE 0.92 0.99  CALCIUM 8.6 8.4   LFT No results found for this basename: PROT,ALBUMIN,AST,ALT,ALKPHOS,BILITOT,BILIDIR,IBILI in the last 72 hours PT/INR No results found for this basename: LABPROT:2,INR:2 in the last 72 hours Hepatitis Panel No results found for this basename: HEPBSAG,HCVAB,HEPAIGM,HEPBIGM in the last 72 hours C-Diff No results found for this basename: CDIFFTOX:3 in the last 72 hours Fecal Lactopherrin No results found for this basename: FECLLACTOFRN in the last 72 hours  Studies/Results: Nm Gi Blood Loss  09/23/2011  *RADIOLOGY REPORT*  Clinical Data: Rectal bleeding  NUCLEAR MEDICINE GASTROINTESTINAL BLEEDING STUDY  Technique:  Sequential abdominal images were obtained following intravenous administration of Tc-61m labeled red blood cells.  Radiopharmaceutical: 23MILLI CURIE ULTRATAG TECHNETIUM TC 70M- LABELED RED BLOOD CELLS IV KIT  Comparison: None  Findings: Following the intravenous  administration of the radiopharmaceutical dynamic acquisition of images was performed at 1 minute per frame for 120 minutes.  No abnormal focus of increased radiotracer uptake is identified within the field of view to suggest active lower GI bleed.  There is normal physiologic distribution of the radiopharmaceutical within the blood pool, liver and urinary bladder.  IMPRESSION:  1.  No specific features identified to suggest active GI bleeding.  Original Report Authenticated By: Angelita Ingles, M.D.    Medications:  Scheduled:   . acetaminophen  650 mg Oral Once  . allopurinol  300 mg Oral Daily  . dorzolamide-timolol  1 drop Both Eyes BID  . fluticasone  2 spray Each Nare Daily  . latanoprost  1 drop Right Eye QHS  . polyethylene glycol  17 g Oral BID  . DISCONTD: polyethylene glycol  17 g Oral Daily   Continuous:   . sodium chloride    . DISCONTD: sodium chloride 20 mL/hr (09/22/11 1432)    Assessment/Plan: 1) Diverticular bleed   Her HGB is relatively stable, but there is a slight trending downwards.  The RBC scan was negative.  Plan: 1) Follow HGB and transfuse as necessary.   2) If the bleeding does not completely arrest a surgical consultation is warranted. 3) No active GI intervention at this time.  I will sign off for now.  Please call with any questions or concerns.  LOS: 7 days   Aayan Haskew D 09/25/2011, 7:13 AM

## 2011-09-25 NOTE — Progress Notes (Signed)
Agree with student PT treatment note.  [place], [place] DPT [phone]

## 2011-09-26 ENCOUNTER — Inpatient Hospital Stay
Admission: RE | Admit: 2011-09-26 | Discharge: 2011-10-16 | Disposition: A | Discharge status: Home / Self Care | Payer: PRIVATE HEALTH INSURANCE | Source: Ambulatory Visit | Attending: Pulmonary Disease | Admitting: Pulmonary Disease

## 2011-09-26 DIAGNOSIS — I10 Essential (primary) hypertension: Secondary | ICD-10-CM | POA: Diagnosis not present

## 2011-09-26 DIAGNOSIS — M6281 Muscle weakness (generalized): Secondary | ICD-10-CM | POA: Diagnosis not present

## 2011-09-26 DIAGNOSIS — K5730 Diverticulosis of large intestine without perforation or abscess without bleeding: Secondary | ICD-10-CM | POA: Diagnosis not present

## 2011-09-26 DIAGNOSIS — Z5189 Encounter for other specified aftercare: Secondary | ICD-10-CM | POA: Diagnosis not present

## 2011-09-26 DIAGNOSIS — R262 Difficulty in walking, not elsewhere classified: Secondary | ICD-10-CM | POA: Diagnosis not present

## 2011-09-26 DIAGNOSIS — K922 Gastrointestinal hemorrhage, unspecified: Secondary | ICD-10-CM | POA: Diagnosis not present

## 2011-09-26 DIAGNOSIS — R55 Syncope and collapse: Secondary | ICD-10-CM | POA: Diagnosis not present

## 2011-09-26 DIAGNOSIS — R112 Nausea with vomiting, unspecified: Secondary | ICD-10-CM | POA: Diagnosis not present

## 2011-09-26 DIAGNOSIS — K5732 Diverticulitis of large intestine without perforation or abscess without bleeding: Secondary | ICD-10-CM | POA: Diagnosis not present

## 2011-09-26 DIAGNOSIS — D62 Acute posthemorrhagic anemia: Secondary | ICD-10-CM | POA: Diagnosis not present

## 2011-09-26 LAB — CBC
HCT: 26 % — ABNORMAL LOW (ref 36.0–46.0)
Hemoglobin: 8.7 g/dL — ABNORMAL LOW (ref 12.0–15.0)
MCH: 31 pg (ref 26.0–34.0)
MCHC: 33.5 g/dL (ref 30.0–36.0)
MCV: 92.5 fL (ref 78.0–100.0)
Platelets: 236 10*3/uL (ref 150–400)
RBC: 2.81 MIL/uL — ABNORMAL LOW (ref 3.87–5.11)
RDW: 17.7 % — ABNORMAL HIGH (ref 11.5–15.5)
WBC: 7.5 10*3/uL (ref 4.0–10.5)

## 2011-09-26 MED ORDER — POLYETHYLENE GLYCOL 3350 17 G PO PACK: 17.0000 g | PACK | Freq: Two times a day (BID) | ORAL | Status: AC

## 2011-09-26 NOTE — Progress Notes (Signed)
Leanna Battles to be D/C'd Home per MD order.  Discussed prescriptions and follow up appointments with the patient. Prescriptions given to patient, medication list explained in detail. Pt verbalized understanding.   Keirstan, Matsuyama  Home Medication Instructions [id number]   Printed on:09/26/11 1525  Medication Information                    Multiple Vitamins-Minerals (PRESERVISION AREDS PO) Take 2 tablets by mouth 2 (two) times daily.            latanoprost (XALATAN) 0.005 % ophthalmic solution Place 1 drop into the right eye at bedtime.            Polyethyl Glycol-Propyl Glycol (SYSTANE ULTRA OP) Apply 1 drop to eye 3 (three) times daily.            Calcium 600-200 MG-UNIT per tablet Take 1 tablet by mouth daily.             colesevelam (WELCHOL) 625 MG tablet Take 1,875 mg by mouth 2 (two) times daily with a meal.             allopurinol (ZYLOPRIM) 300 MG tablet Take 300 mg by mouth daily.             NIFEdipine (PROCARDIA-XL/ADALAT CC) 30 MG 24 hr tablet Take 30 mg by mouth daily.            dorzolamide-timolol (COSOPT) 22.3-6.8 MG/ML ophthalmic solution Place 1 drop into both eyes 2 (two) times daily.             fluticasone (FLONASE) 50 MCG/ACT nasal spray Place 2 sprays into the nose daily.             oxybutynin (DITROPAN-XL) 5 MG 24 hr tablet Take 5 mg by mouth daily.           polyethylene glycol (MIRALAX / GLYCOLAX) packet Take 17 g by mouth 2 (two) times daily.             Filed Vitals:   09/26/11 1429  BP: 130/70  Pulse: 80  Temp: 98.2 F (36.8 C)  Resp: 18    Skin clean, dry and intact. Large skin tear to right hand from fall prior to admission, dressing changed prior to discharge. IV catheter discontinued intact. Pt denies pain at this time. No complaints noted.  Pt transported to [facility] by [employer].   Driggers, Allene Pyo 09/26/2011 3:25 PM

## 2011-09-26 NOTE — Progress Notes (Signed)
Clinical Education officer, museum received report from [employer] [place] as pt transferred from [employer] [facility]. Per report, pt chooses bed at [facility] and per MD, pt medically stable for discharge today. Clinical Social Worker spoke with [facility] who confirmed bed availability for today. Clinical Social Worker notified MD, pt, and pt daughter.  2:30pm- Clinical Social Worker facilitated pt discharge needs including contacting facility, family, and arranging ambulance transportation to [facility]. No further social work needs at this time. Clinical Social Worker signing off.   Drake Leach, MSW, [place] Social Work [phone]

## 2011-09-26 NOTE — Discharge Summary (Signed)
PATIENT DETAILS Name: Cindy Schultz Age: 78 y.o. Sex: female Date of Birth: 1923/08/03 MRN: [id number]. Admit Date: 09/18/2011 Admitting Physician: Rise Patience, MD [id number] L, MD, MD  PRIMARY DISCHARGE DIAGNOSIS:  Principal Problem:  *GI bleed Active Problems:  HTN (hypertension)  Anemia due to blood loss, acute  Syncopal episodes  Diverticulosis of colon with hemorrhage      PAST MEDICAL HISTORY: Past Medical History  Diagnosis Date  . Macular degeneration   . Glaucoma   . Precancerous changes of the cervix     lesions  . Rectocele   . Diverticulitis   . Lumbar spine pain   . Pain in joints   . Hearing loss   . HTN (hypertension)     cholesterol    DISCHARGE MEDICATIONS: Medication List  As of 09/26/2011 10:50 AM   STOP taking these medications         aspirin 81 MG tablet      naproxen 500 MG tablet         TAKE these medications         allopurinol 300 MG tablet   Commonly known as: ZYLOPRIM   Take 300 mg by mouth daily.      Calcium 600-200 MG-UNIT per tablet   Take 1 tablet by mouth daily.      colesevelam 625 MG tablet   Commonly known as: WELCHOL   Take 1,875 mg by mouth 2 (two) times daily with a meal.      dorzolamide-timolol 22.3-6.8 MG/ML ophthalmic solution   Commonly known as: COSOPT   Place 1 drop into both eyes 2 (two) times daily.      fluticasone 50 MCG/ACT nasal spray   Commonly known as: FLONASE   Place 2 sprays into the nose daily.      NIFEdipine 30 MG 24 hr tablet   Commonly known as: PROCARDIA-XL/ADALAT CC   Take 30 mg by mouth daily.      oxybutynin 5 MG 24 hr tablet   Commonly known as: DITROPAN-XL   Take 5 mg by mouth daily.      polyethylene glycol packet   Commonly known as: MIRALAX / GLYCOLAX   Take 17 g by mouth 2 (two) times daily.      PRESERVISION AREDS PO   Take 2 tablets by mouth 2 (two) times daily.      SYSTANE ULTRA OP   Apply 1 drop to eye 3 (three) times daily.      XALATAN  0.005 % ophthalmic solution   Generic drug: latanoprost   Place 1 drop into the right eye at bedtime.             BRIEF HPI:  See H&P, Labs, Consult and Test reports for all details in brief, patient was admitted for year-old female was brought to the ER after patient had a syncopal episode following a rectal bleed.Patient states she has been having multiple bloody bowel movements since Tuesday morning 10 AM. Her presenting Hgb was 9, with an apparent baseline Hgb of 13.   CONSULTATIONS:   GI  PERTINENT RADIOLOGIC STUDIES: Nm Gi Blood Loss  09/23/2011  *RADIOLOGY REPORT*  Clinical Data: Rectal bleeding  NUCLEAR MEDICINE GASTROINTESTINAL BLEEDING STUDY  Technique:  Sequential abdominal images were obtained following intravenous administration of Tc-20m labeled red blood cells.  Radiopharmaceutical: 23MILLI CURIE ULTRATAG TECHNETIUM TC 54M- LABELED RED BLOOD CELLS IV KIT  Comparison: None  Findings: Following the intravenous administration of the radiopharmaceutical dynamic acquisition of  images was performed at 1 minute per frame for 120 minutes.  No abnormal focus of increased radiotracer uptake is identified within the field of view to suggest active lower GI bleed.  There is normal physiologic distribution of the radiopharmaceutical within the blood pool, liver and urinary bladder.  IMPRESSION:  1.  No specific features identified to suggest active GI bleeding.  Original Report Authenticated By: Angelita Ingles, M.D.     PERTINENT LAB RESULTS: CBC:  Basename 09/26/11 0517 09/25/11 0436  WBC 7.5 6.9  HGB 8.7* 9.0*  HCT 26.0* 26.9*  PLT 236 182   CMET CMP     Component Value Date/Time   NA 142 09/24/2011 0428   K 3.6 09/24/2011 0428   CL 109 09/24/2011 0428   CO2 24 09/24/2011 0428   GLUCOSE 97 09/24/2011 0428   BUN 10 09/24/2011 0428   CREATININE 0.92 09/24/2011 0428   CALCIUM 8.6 09/24/2011 0428   PROT 4.2* 09/19/2011 0630   ALBUMIN 2.2* 09/19/2011 0630   AST 14 09/19/2011 0630   ALT 9  09/19/2011 0630   ALKPHOS 52 09/19/2011 0630   BILITOT 0.1* 09/19/2011 0630   GFRNONAA 55* 09/24/2011 0428   GFRAA 64* 09/24/2011 0428    GFR Estimated Creatinine Clearance: 35.4 ml/min (by C-G formula based on Cr of 0.92). No results found for this basename: LIPASE:2,AMYLASE:2 in the last 72 hours No results found for this basename: CKTOTAL:3,CKMB:3,CKMBINDEX:3,TROPONINI:3 in the last 72 hours No components found with this basename: POCBNP:3 No results found for this basename: DDIMER:2 in the last 72 hours No results found for this basename: HGBA1C:2 in the last 72 hours No results found for this basename: CHOL:2,HDL:2,LDLCALC:2,TRIG:2,CHOLHDL:2,LDLDIRECT:2 in the last 72 hours No results found for this basename: TSH,T4TOTAL,FREET3,T3FREE,THYROIDAB in the last 72 hours No results found for this basename: VITAMINB12:2,FOLATE:2,FERRITIN:2,TIBC:2,IRON:2,RETICCTPCT:2 in the last 72 hours Coags: No results found for this basename: PT:2,INR:2 in the last 72 hours Microbiology: Recent Results (from the past 240 hour(s))  MRSA PCR SCREENING     Status: Normal   Collection Time   09/19/11  2:18 AM      Component Value Range Status Comment   MRSA by PCR NEGATIVE  NEGATIVE  Final      BRIEF HOSPITAL COURSE:   Principal Problem: Acute GI bleed/BRBPR  *suspect recurrent bleeding related to stuttering oozing from diverticula  *colonoscopy reveals pan-diverticula as well as hemorrhoids, but a clear pinpoint source of bleeding could not be identified and *Nuclear medicine bleeding scan was negative as well *patient has had no bright red blood per rectum for the past 2-3 days, claims stools are brown in color *hemoglobin continues to be stable, with mild decrease-suspect drop likely due to equilibration as no further GI bleed evident clinically.However if patient were to develop bleeding again,will need to have surgical consultation and evaluation, however since bleeding seems to have resolved we are  deferring this for now.  Anemia from acute blood loss  *Hgb nadir of 6.4 - has required blood transfusion , with total of 4U PRBC transfused thus far this admission *Continue to follow Hb periodically as an outpatient-suggest weekly monitoring for the immediate future till Hb starts stabilizing and trending up  Syncopal spell  *Due to hypovolemia / acute blood loss - no sx of orthostasis at present - no further investigation required   History of DVT 2 years ago  *Off Coumadin but on aspirin-hold for atleast 2 weeks before resuming  Hx of HTN  *resume Nifedipine on discharge  Gout  *On prophy med tx  TODAY-DAY OF DISCHARGE:  Subjective:   Cindy Schultz today has no headache,no chest abdominal pain,no new weakness tingling or numbness, feels much better wants to go home today.As noted above, she has  Had no further hematochezia for 2-3 days.  Objective:   Blood pressure 132/76, pulse 80, temperature 98.3 F (36.8 C), temperature source Oral, resp. rate 18, height 5\' 2"  (1.575 m), weight 59.4 kg (130 lb 15.3 oz), SpO2 97.00%.  Intake/Output Summary (Last 24 hours) at 09/26/11 1050 Last data filed at 09/25/11 2100  Gross per 24 hour  Intake    120 ml  Output      2 ml  Net    118 ml    Exam Awake Alert, Oriented *3, No new F.N deficits, Normal affect [place].AT,PERRAL Supple Neck,No JVD, No cervical lymphadenopathy appriciated.  Symmetrical Chest wall movement, Good air movement bilaterally, CTAB RRR,No Gallops,Rubs or new Murmurs, No Parasternal Heave +ve B.Sounds, Abd Soft, Non tender, No organomegaly appriciated, No rebound -guarding or rigidity. No Cyanosis, Clubbing or edema, No new Rash or bruise  DISPOSITION: SNF   DISCHARGE INSTRUCTIONS:    Follow-up Information    Follow up with HAWKINS,EDWARD L, MD 1-2 weeks after discharge from SNF        Total Time spent on discharge equals 45 minutes.  SignedOren Binet 09/26/2011 10:50 AM

## 2011-09-26 NOTE — Progress Notes (Signed)
   CARE MANAGEMENT NOTE 09/26/2011  Patient:  Cindy Schultz, Cindy Schultz   Account Number:  [id number]  Date Initiated:  09/26/2011  Documentation initiated by:  Tomi Bamberger  Subjective/Objective Assessment:   dx gib  admit- lives alone.     Action/Plan:   pt/ot eval- recs snf   Anticipated DC Date:  09/26/2011   Anticipated DC Plan:  SKILLED NURSING FACILITY  In-house referral  Clinical Social Worker      DC Planning Services  CM consult      Choice offered to / List presented to:             Status of service:  Completed, signed off Medicare Important Message given?   (If response is "NO", the following Medicare IM given date fields will be blank) Date Medicare IM given:   Date Additional Medicare IM given:    Discharge Disposition:  [place]  Per UR Regulation:    If discussed at Long Length of Stay Meetings, dates discussed:    Comments:  09/26/11 12:13 Tomi Bamberger RN, BSN [phone] patient lives alone, per physical therapy and ot recs snf. CSW referral.  Patient for dc today to SNF in [place].

## 2011-10-15 DIAGNOSIS — K922 Gastrointestinal hemorrhage, unspecified: Secondary | ICD-10-CM | POA: Diagnosis not present

## 2011-10-16 NOTE — H&P (Signed)
NAMEMarland Kitchen  AIDIA, MOCZYGEMBA              ACCOUNT NO.:  [id number]  MEDICAL RECORD NO.:  [id number]  LOCATION:                                 FACILITY:  PHYSICIAN:  Vaneza Pickart L. Luan Pulling, M.D.DATE OF BIRTH:  1924/05/04  DATE OF ADMISSION:  09/26/2011 DATE OF DISCHARGE:  LH                             HISTORY & PHYSICAL   Reason for admission is rehabilitation.  HISTORY:  Ms. Sylve had a gastrointestinal bleed.  She required blood transfusions and did well after that but was so weak that it was clear that she was going to have to require rehabilitation.  She has a past medical history of hypertension, a rectocele, osteoarthritis, she has had some previous fractures.  Her family history is not known to be positive for any sort of GI problems.  SOCIAL HISTORY:  She lives at home alone.  She does not smoke.  She does not use any alcohol.  Her review of systems except as mentioned is negative.  PHYSICAL EXAMINATION:  GENERAL:  Shows a well-developed, well-nourished female who appears to be in no acute distress. HEENT:  Her pupils are reactive.  Her nose and throat are clear.  Her mucous membranes are moist. NECK:  Supple without masses. CHEST:  Clear. HEART:  Regular. ABDOMEN:  Soft. She has no edema.  ASSESSMENT:  She is improving.  She has had a gastrointestinal bleed. She is going to be ready for discharge home fairly soon.     Hadi Dubin L. Luan Pulling, M.D.     ELH/MEDQ  D:  10/15/2011  T:  10/15/2011  Job:  [id number]

## 2011-10-18 DIAGNOSIS — Z96659 Presence of unspecified artificial knee joint: Secondary | ICD-10-CM | POA: Diagnosis not present

## 2011-10-18 DIAGNOSIS — I10 Essential (primary) hypertension: Secondary | ICD-10-CM | POA: Diagnosis not present

## 2011-10-18 DIAGNOSIS — M159 Polyosteoarthritis, unspecified: Secondary | ICD-10-CM | POA: Diagnosis not present

## 2011-10-18 DIAGNOSIS — IMO0001 Reserved for inherently not codable concepts without codable children: Secondary | ICD-10-CM | POA: Diagnosis not present

## 2011-10-18 DIAGNOSIS — R269 Unspecified abnormalities of gait and mobility: Secondary | ICD-10-CM | POA: Diagnosis not present

## 2011-10-23 DIAGNOSIS — Z96659 Presence of unspecified artificial knee joint: Secondary | ICD-10-CM | POA: Diagnosis not present

## 2011-10-23 DIAGNOSIS — R269 Unspecified abnormalities of gait and mobility: Secondary | ICD-10-CM | POA: Diagnosis not present

## 2011-10-23 DIAGNOSIS — IMO0001 Reserved for inherently not codable concepts without codable children: Secondary | ICD-10-CM | POA: Diagnosis not present

## 2011-10-23 DIAGNOSIS — I10 Essential (primary) hypertension: Secondary | ICD-10-CM | POA: Diagnosis not present

## 2011-10-23 DIAGNOSIS — M159 Polyosteoarthritis, unspecified: Secondary | ICD-10-CM | POA: Diagnosis not present

## 2011-10-24 DIAGNOSIS — K922 Gastrointestinal hemorrhage, unspecified: Secondary | ICD-10-CM | POA: Diagnosis not present

## 2011-10-24 DIAGNOSIS — D649 Anemia, unspecified: Secondary | ICD-10-CM | POA: Diagnosis not present

## 2011-10-24 DIAGNOSIS — J3089 Other allergic rhinitis: Secondary | ICD-10-CM | POA: Diagnosis not present

## 2011-10-24 DIAGNOSIS — M109 Gout, unspecified: Secondary | ICD-10-CM | POA: Diagnosis not present

## 2011-10-24 DIAGNOSIS — N318 Other neuromuscular dysfunction of bladder: Secondary | ICD-10-CM | POA: Diagnosis not present

## 2011-10-25 DIAGNOSIS — M159 Polyosteoarthritis, unspecified: Secondary | ICD-10-CM | POA: Diagnosis not present

## 2011-10-25 DIAGNOSIS — I890 Lymphedema, not elsewhere classified: Secondary | ICD-10-CM | POA: Diagnosis not present

## 2011-10-25 DIAGNOSIS — R269 Unspecified abnormalities of gait and mobility: Secondary | ICD-10-CM | POA: Diagnosis not present

## 2011-10-25 DIAGNOSIS — K922 Gastrointestinal hemorrhage, unspecified: Secondary | ICD-10-CM | POA: Diagnosis not present

## 2011-10-25 DIAGNOSIS — Z96659 Presence of unspecified artificial knee joint: Secondary | ICD-10-CM | POA: Diagnosis not present

## 2011-10-25 DIAGNOSIS — IMO0001 Reserved for inherently not codable concepts without codable children: Secondary | ICD-10-CM | POA: Diagnosis not present

## 2011-10-25 DIAGNOSIS — I10 Essential (primary) hypertension: Secondary | ICD-10-CM | POA: Diagnosis not present

## 2011-10-30 DIAGNOSIS — M159 Polyosteoarthritis, unspecified: Secondary | ICD-10-CM | POA: Diagnosis not present

## 2011-10-30 DIAGNOSIS — IMO0001 Reserved for inherently not codable concepts without codable children: Secondary | ICD-10-CM | POA: Diagnosis not present

## 2011-10-30 DIAGNOSIS — I10 Essential (primary) hypertension: Secondary | ICD-10-CM | POA: Diagnosis not present

## 2011-10-30 DIAGNOSIS — R269 Unspecified abnormalities of gait and mobility: Secondary | ICD-10-CM | POA: Diagnosis not present

## 2011-10-30 DIAGNOSIS — Z96659 Presence of unspecified artificial knee joint: Secondary | ICD-10-CM | POA: Diagnosis not present

## 2011-11-02 DIAGNOSIS — R269 Unspecified abnormalities of gait and mobility: Secondary | ICD-10-CM | POA: Diagnosis not present

## 2011-11-02 DIAGNOSIS — IMO0001 Reserved for inherently not codable concepts without codable children: Secondary | ICD-10-CM | POA: Diagnosis not present

## 2011-11-02 DIAGNOSIS — M159 Polyosteoarthritis, unspecified: Secondary | ICD-10-CM | POA: Diagnosis not present

## 2011-11-02 DIAGNOSIS — Z96659 Presence of unspecified artificial knee joint: Secondary | ICD-10-CM | POA: Diagnosis not present

## 2011-11-02 DIAGNOSIS — I10 Essential (primary) hypertension: Secondary | ICD-10-CM | POA: Diagnosis not present

## 2011-11-22 DIAGNOSIS — K922 Gastrointestinal hemorrhage, unspecified: Secondary | ICD-10-CM | POA: Diagnosis not present

## 2011-11-22 DIAGNOSIS — F411 Generalized anxiety disorder: Secondary | ICD-10-CM | POA: Diagnosis not present

## 2011-11-22 DIAGNOSIS — F329 Major depressive disorder, single episode, unspecified: Secondary | ICD-10-CM | POA: Diagnosis not present

## 2011-11-22 DIAGNOSIS — D649 Anemia, unspecified: Secondary | ICD-10-CM | POA: Diagnosis not present

## 2011-11-22 DIAGNOSIS — R609 Edema, unspecified: Secondary | ICD-10-CM | POA: Diagnosis not present

## 2011-11-22 DIAGNOSIS — M1990 Unspecified osteoarthritis, unspecified site: Secondary | ICD-10-CM | POA: Diagnosis not present

## 2011-11-27 DIAGNOSIS — H4011X0 Primary open-angle glaucoma, stage unspecified: Secondary | ICD-10-CM | POA: Diagnosis not present

## 2011-12-18 DIAGNOSIS — D235 Other benign neoplasm of skin of trunk: Secondary | ICD-10-CM | POA: Diagnosis not present

## 2011-12-18 DIAGNOSIS — Z85828 Personal history of other malignant neoplasm of skin: Secondary | ICD-10-CM | POA: Diagnosis not present

## 2011-12-18 DIAGNOSIS — L821 Other seborrheic keratosis: Secondary | ICD-10-CM | POA: Diagnosis not present

## 2011-12-18 DIAGNOSIS — L570 Actinic keratosis: Secondary | ICD-10-CM | POA: Diagnosis not present

## 2011-12-21 DIAGNOSIS — M1990 Unspecified osteoarthritis, unspecified site: Secondary | ICD-10-CM | POA: Diagnosis not present

## 2011-12-21 DIAGNOSIS — E785 Hyperlipidemia, unspecified: Secondary | ICD-10-CM | POA: Diagnosis not present

## 2011-12-21 DIAGNOSIS — R5383 Other fatigue: Secondary | ICD-10-CM | POA: Diagnosis not present

## 2011-12-21 DIAGNOSIS — I10 Essential (primary) hypertension: Secondary | ICD-10-CM | POA: Diagnosis not present

## 2011-12-21 DIAGNOSIS — N898 Other specified noninflammatory disorders of vagina: Secondary | ICD-10-CM | POA: Diagnosis not present

## 2011-12-26 DIAGNOSIS — Z01419 Encounter for gynecological examination (general) (routine) without abnormal findings: Secondary | ICD-10-CM | POA: Diagnosis not present

## 2011-12-26 DIAGNOSIS — Z780 Asymptomatic menopausal state: Secondary | ICD-10-CM | POA: Diagnosis not present

## 2012-01-04 DIAGNOSIS — H4011X0 Primary open-angle glaucoma, stage unspecified: Secondary | ICD-10-CM | POA: Diagnosis not present

## 2012-01-16 ENCOUNTER — Encounter (INDEPENDENT_AMBULATORY_CARE_PROVIDER_SITE_OTHER): Payer: Medicare Other | Admitting: Ophthalmology

## 2012-01-16 DIAGNOSIS — I10 Essential (primary) hypertension: Secondary | ICD-10-CM | POA: Diagnosis not present

## 2012-01-16 DIAGNOSIS — H43819 Vitreous degeneration, unspecified eye: Secondary | ICD-10-CM | POA: Diagnosis not present

## 2012-01-16 DIAGNOSIS — H35039 Hypertensive retinopathy, unspecified eye: Secondary | ICD-10-CM | POA: Diagnosis not present

## 2012-01-16 DIAGNOSIS — H3530 Unspecified macular degeneration: Secondary | ICD-10-CM | POA: Diagnosis not present

## 2012-01-16 DIAGNOSIS — H4010X0 Unspecified open-angle glaucoma, stage unspecified: Secondary | ICD-10-CM

## 2012-01-18 ENCOUNTER — Encounter (INDEPENDENT_AMBULATORY_CARE_PROVIDER_SITE_OTHER): Payer: Medicare Other | Admitting: Ophthalmology

## 2012-01-21 DIAGNOSIS — M545 Low back pain: Secondary | ICD-10-CM | POA: Diagnosis not present

## 2012-01-21 DIAGNOSIS — I10 Essential (primary) hypertension: Secondary | ICD-10-CM | POA: Diagnosis not present

## 2012-01-22 DIAGNOSIS — M999 Biomechanical lesion, unspecified: Secondary | ICD-10-CM | POA: Diagnosis not present

## 2012-01-22 DIAGNOSIS — M5430 Sciatica, unspecified side: Secondary | ICD-10-CM | POA: Diagnosis not present

## 2012-01-22 DIAGNOSIS — M5137 Other intervertebral disc degeneration, lumbosacral region: Secondary | ICD-10-CM | POA: Diagnosis not present

## 2012-01-25 DIAGNOSIS — M5137 Other intervertebral disc degeneration, lumbosacral region: Secondary | ICD-10-CM | POA: Diagnosis not present

## 2012-01-25 DIAGNOSIS — M999 Biomechanical lesion, unspecified: Secondary | ICD-10-CM | POA: Diagnosis not present

## 2012-01-25 DIAGNOSIS — M5430 Sciatica, unspecified side: Secondary | ICD-10-CM | POA: Diagnosis not present

## 2012-01-28 DIAGNOSIS — M5430 Sciatica, unspecified side: Secondary | ICD-10-CM | POA: Diagnosis not present

## 2012-01-28 DIAGNOSIS — M999 Biomechanical lesion, unspecified: Secondary | ICD-10-CM | POA: Diagnosis not present

## 2012-01-28 DIAGNOSIS — M5137 Other intervertebral disc degeneration, lumbosacral region: Secondary | ICD-10-CM | POA: Diagnosis not present

## 2012-01-31 DIAGNOSIS — M5137 Other intervertebral disc degeneration, lumbosacral region: Secondary | ICD-10-CM | POA: Diagnosis not present

## 2012-01-31 DIAGNOSIS — M999 Biomechanical lesion, unspecified: Secondary | ICD-10-CM | POA: Diagnosis not present

## 2012-01-31 DIAGNOSIS — M5430 Sciatica, unspecified side: Secondary | ICD-10-CM | POA: Diagnosis not present

## 2012-02-05 DIAGNOSIS — I10 Essential (primary) hypertension: Secondary | ICD-10-CM | POA: Diagnosis not present

## 2012-02-06 DIAGNOSIS — M5137 Other intervertebral disc degeneration, lumbosacral region: Secondary | ICD-10-CM | POA: Diagnosis not present

## 2012-02-06 DIAGNOSIS — M5430 Sciatica, unspecified side: Secondary | ICD-10-CM | POA: Diagnosis not present

## 2012-02-06 DIAGNOSIS — M999 Biomechanical lesion, unspecified: Secondary | ICD-10-CM | POA: Diagnosis not present

## 2012-02-13 DIAGNOSIS — M5137 Other intervertebral disc degeneration, lumbosacral region: Secondary | ICD-10-CM | POA: Diagnosis not present

## 2012-02-13 DIAGNOSIS — M999 Biomechanical lesion, unspecified: Secondary | ICD-10-CM | POA: Diagnosis not present

## 2012-02-13 DIAGNOSIS — M5430 Sciatica, unspecified side: Secondary | ICD-10-CM | POA: Diagnosis not present

## 2012-02-20 DIAGNOSIS — M5137 Other intervertebral disc degeneration, lumbosacral region: Secondary | ICD-10-CM | POA: Diagnosis not present

## 2012-02-20 DIAGNOSIS — M5430 Sciatica, unspecified side: Secondary | ICD-10-CM | POA: Diagnosis not present

## 2012-02-20 DIAGNOSIS — M999 Biomechanical lesion, unspecified: Secondary | ICD-10-CM | POA: Diagnosis not present

## 2012-02-22 DIAGNOSIS — G4730 Sleep apnea, unspecified: Secondary | ICD-10-CM | POA: Diagnosis not present

## 2012-02-22 DIAGNOSIS — I10 Essential (primary) hypertension: Secondary | ICD-10-CM | POA: Diagnosis not present

## 2012-02-22 DIAGNOSIS — E785 Hyperlipidemia, unspecified: Secondary | ICD-10-CM | POA: Diagnosis not present

## 2012-02-22 DIAGNOSIS — F411 Generalized anxiety disorder: Secondary | ICD-10-CM | POA: Diagnosis not present

## 2012-03-04 DIAGNOSIS — Z23 Encounter for immunization: Secondary | ICD-10-CM | POA: Diagnosis not present

## 2012-03-19 DIAGNOSIS — M999 Biomechanical lesion, unspecified: Secondary | ICD-10-CM | POA: Diagnosis not present

## 2012-03-19 DIAGNOSIS — M5137 Other intervertebral disc degeneration, lumbosacral region: Secondary | ICD-10-CM | POA: Diagnosis not present

## 2012-03-19 DIAGNOSIS — M5430 Sciatica, unspecified side: Secondary | ICD-10-CM | POA: Diagnosis not present

## 2012-04-08 DIAGNOSIS — E785 Hyperlipidemia, unspecified: Secondary | ICD-10-CM | POA: Diagnosis not present

## 2012-04-08 DIAGNOSIS — F411 Generalized anxiety disorder: Secondary | ICD-10-CM | POA: Diagnosis not present

## 2012-04-08 DIAGNOSIS — D649 Anemia, unspecified: Secondary | ICD-10-CM | POA: Diagnosis not present

## 2012-04-08 DIAGNOSIS — I10 Essential (primary) hypertension: Secondary | ICD-10-CM | POA: Diagnosis not present

## 2012-04-16 DIAGNOSIS — M999 Biomechanical lesion, unspecified: Secondary | ICD-10-CM | POA: Diagnosis not present

## 2012-04-16 DIAGNOSIS — M5137 Other intervertebral disc degeneration, lumbosacral region: Secondary | ICD-10-CM | POA: Diagnosis not present

## 2012-04-16 DIAGNOSIS — M5430 Sciatica, unspecified side: Secondary | ICD-10-CM | POA: Diagnosis not present

## 2012-04-29 DIAGNOSIS — L659 Nonscarring hair loss, unspecified: Secondary | ICD-10-CM | POA: Diagnosis not present

## 2012-04-29 DIAGNOSIS — L570 Actinic keratosis: Secondary | ICD-10-CM | POA: Diagnosis not present

## 2012-04-29 DIAGNOSIS — D235 Other benign neoplasm of skin of trunk: Secondary | ICD-10-CM | POA: Diagnosis not present

## 2012-04-29 DIAGNOSIS — Z85828 Personal history of other malignant neoplasm of skin: Secondary | ICD-10-CM | POA: Diagnosis not present

## 2012-05-12 DIAGNOSIS — Z1231 Encounter for screening mammogram for malignant neoplasm of breast: Secondary | ICD-10-CM | POA: Diagnosis not present

## 2012-05-12 DIAGNOSIS — Z13820 Encounter for screening for osteoporosis: Secondary | ICD-10-CM | POA: Diagnosis not present

## 2012-05-14 DIAGNOSIS — M5137 Other intervertebral disc degeneration, lumbosacral region: Secondary | ICD-10-CM | POA: Diagnosis not present

## 2012-05-14 DIAGNOSIS — M999 Biomechanical lesion, unspecified: Secondary | ICD-10-CM | POA: Diagnosis not present

## 2012-05-14 DIAGNOSIS — M5430 Sciatica, unspecified side: Secondary | ICD-10-CM | POA: Diagnosis not present

## 2012-05-29 ENCOUNTER — Ambulatory Visit (INDEPENDENT_AMBULATORY_CARE_PROVIDER_SITE_OTHER): Payer: Medicare Other | Admitting: Otolaryngology

## 2012-05-29 DIAGNOSIS — J310 Chronic rhinitis: Secondary | ICD-10-CM | POA: Diagnosis not present

## 2012-05-29 DIAGNOSIS — H903 Sensorineural hearing loss, bilateral: Secondary | ICD-10-CM | POA: Diagnosis not present

## 2012-05-30 DIAGNOSIS — H04129 Dry eye syndrome of unspecified lacrimal gland: Secondary | ICD-10-CM | POA: Diagnosis not present

## 2012-06-04 DIAGNOSIS — H35039 Hypertensive retinopathy, unspecified eye: Secondary | ICD-10-CM | POA: Diagnosis not present

## 2012-06-04 DIAGNOSIS — H353190 Nonexudative age-related macular degeneration, unspecified eye, stage unspecified: Secondary | ICD-10-CM | POA: Diagnosis not present

## 2012-06-04 DIAGNOSIS — H4011X0 Primary open-angle glaucoma, stage unspecified: Secondary | ICD-10-CM | POA: Diagnosis not present

## 2012-06-04 DIAGNOSIS — H43819 Vitreous degeneration, unspecified eye: Secondary | ICD-10-CM | POA: Diagnosis not present

## 2012-06-24 ENCOUNTER — Ambulatory Visit (HOSPITAL_COMMUNITY)
Admission: RE | Admit: 2012-06-24 | Discharge: 2012-06-24 | Disposition: A | Discharge status: Home / Self Care | Payer: Medicare Other | Source: Ambulatory Visit | Attending: Pulmonary Disease | Admitting: Pulmonary Disease

## 2012-06-24 ENCOUNTER — Other Ambulatory Visit (HOSPITAL_COMMUNITY): Payer: Self-pay | Admitting: Pulmonary Disease

## 2012-06-24 DIAGNOSIS — M51379 Other intervertebral disc degeneration, lumbosacral region without mention of lumbar back pain or lower extremity pain: Secondary | ICD-10-CM | POA: Insufficient documentation

## 2012-06-24 DIAGNOSIS — M5137 Other intervertebral disc degeneration, lumbosacral region: Secondary | ICD-10-CM | POA: Insufficient documentation

## 2012-06-24 DIAGNOSIS — M1990 Unspecified osteoarthritis, unspecified site: Secondary | ICD-10-CM | POA: Diagnosis not present

## 2012-06-24 DIAGNOSIS — IMO0002 Reserved for concepts with insufficient information to code with codable children: Secondary | ICD-10-CM | POA: Diagnosis not present

## 2012-06-24 DIAGNOSIS — M545 Low back pain, unspecified: Secondary | ICD-10-CM | POA: Diagnosis not present

## 2012-06-24 DIAGNOSIS — M949 Disorder of cartilage, unspecified: Secondary | ICD-10-CM | POA: Diagnosis not present

## 2012-06-24 DIAGNOSIS — I10 Essential (primary) hypertension: Secondary | ICD-10-CM | POA: Diagnosis not present

## 2012-06-24 DIAGNOSIS — E785 Hyperlipidemia, unspecified: Secondary | ICD-10-CM | POA: Diagnosis not present

## 2012-06-26 ENCOUNTER — Encounter: Payer: Self-pay | Admitting: Orthopedic Surgery

## 2012-06-26 ENCOUNTER — Ambulatory Visit (INDEPENDENT_AMBULATORY_CARE_PROVIDER_SITE_OTHER): Payer: Medicare Other | Admitting: Orthopedic Surgery

## 2012-06-26 VITALS — BP 118/60 | Ht 62.0 in | Wt 128.0 lb

## 2012-06-26 DIAGNOSIS — M1710 Unilateral primary osteoarthritis, unspecified knee: Secondary | ICD-10-CM | POA: Insufficient documentation

## 2012-06-26 DIAGNOSIS — IMO0002 Reserved for concepts with insufficient information to code with codable children: Secondary | ICD-10-CM

## 2012-06-26 DIAGNOSIS — M7051 Other bursitis of knee, right knee: Secondary | ICD-10-CM

## 2012-06-26 DIAGNOSIS — M79609 Pain in unspecified limb: Secondary | ICD-10-CM

## 2012-06-26 DIAGNOSIS — M5410 Radiculopathy, site unspecified: Secondary | ICD-10-CM

## 2012-06-26 DIAGNOSIS — M79605 Pain in left leg: Secondary | ICD-10-CM | POA: Insufficient documentation

## 2012-06-26 MED ORDER — GABAPENTIN 100 MG PO CAPS: 100.0000 mg | ORAL_CAPSULE | Freq: Every day | ORAL | Status: DC

## 2012-06-26 NOTE — Progress Notes (Signed)
Patient ID: Cindy Schultz, female   DOB: 1924-06-17, 79 y.o.   MRN: [id number] Chief Complaint  Patient presents with  . Knee Pain    left knee pain twisted it about 1 month ago    The patient is status post left knee replacement about 8 years ago. She got up from a seated position twisted her knee and felt pain in the back of her leg the pain radiates from her hip all the way down per lower leghe also has right knee pain with crepitance no swelling no catching locking or giving way. Review of systems negative other than some joint aches  Past Medical History  Diagnosis Date  . Macular degeneration   . Glaucoma(365)   . Precancerous changes of the cervix     lesions  . Rectocele   . Diverticulitis   . Lumbar spine pain   . Pain in joints   . Hearing loss   . HTN (hypertension)     cholesterol    Past Surgical History  Procedure Date  . [place]   . Tracheotomy   . Fallen uterus   . Ovarian cyst surgery   . Bladder tac   . Hemmorrhoidectomy   . Met osteostomy   . Left total knee replacement 05/02/04    [name]  . Morton's nueroma   . Elevated metatarsal arch   . Rupture rt groin   . Appendix   . Rectocele repair   . Corneal erosion     cataracts   . Colonoscopy 09/20/2011    Procedure: COLONOSCOPY;  Surgeon: Beryle Beams, MD;  Location: [facility] ENDOSCOPY;  Service: Endoscopy;  Laterality: N/A;    Physical Exam(12)  Vital signs:   GENERAL: normal development   CDV: pulses are normal   Skin: normal  Lymph: nodes were not palpable/normal  Psychiatric: awake, alert and oriented  Neuro: normal sensation  MSK  Gait: Walking is without assistive devices she appears to have a slight limp favoring her right leg actually.  The left knee is tender behind the knee anterior compartment of the leg left hip left thigh posteriorly range of motion left knee and hip normal left total knee incision. Knee flexion normal functional range motor exam normal knee stable  Right knee  tenderness over the pes bursa tenderness over the medial joint line flexion ARC of 120 motor exam normal knee is stable  Assessment:  1. OA (osteoarthritis) of knee  gabapentin (NEURONTIN) 100 MG capsule, DISCONTINUED: gabapentin (NEURONTIN) 100 MG capsule  2. Pes anserinus bursitis of right knee    3. Leg pain, left    4. Radicular pain of left lower extremity         Plan:  gabapentin for her left lower extremity pain he does not appear to be in the joint  Inject right knee pes bursa inject right knee knee joint  Knee  Injection Procedure Note  Pre-operative Diagnosis: right knee oa  Post-operative Diagnosis: same  Indications: pain  Anesthesia: ethyl chloride   Procedure Details   Verbal consent was obtained for the procedure. Time out was completed.The joint was prepped with alcohol, followed by  Ethyl chloride spray and A 20 gauge needle was inserted into the knee via lateral approach; 54ml 1% lidocaine and 1 ml of depomedrol  was then injected into the joint . The needle was removed and the area cleansed and dressed.  Complications:  None; patient tolerated the procedure well.  Knee  Injection Procedure Note  Pre-operative  Diagnosis: right knee bursitis  Post-operative Diagnosis: same  Indications: pain  Anesthesia: ethyl chloride   Procedure Details   Verbal consent was obtained for the procedure. Time out was completed.The burs  was prepped with alcohol, followed by  Ethyl chloride spray and A 25 gauge needle was inserted into the pes bursa via lateral approach; 34ml 1% lidocaine and 1 ml of depomedrol  was then injected into the joint . The needle was removed and the area cleansed and dressed.  Complications:  None; patient tolerated the procedure well.

## 2012-06-26 NOTE — Patient Instructions (Addendum)
Problem 1 right knee bursitis Problem 2 right knee arthritis Problem 3 left leg pinched nerve   Start gabapentin 100 mg every night   You have received a steroid shot. 15% of patients experience increased pain at the injection site with in the next 24 hours. This is best treated with ice and tylenol extra strength 2 tabs every 8 hours. If you are still having pain please call the office.

## 2012-07-09 DIAGNOSIS — K5900 Constipation, unspecified: Secondary | ICD-10-CM | POA: Diagnosis not present

## 2012-07-09 DIAGNOSIS — M5137 Other intervertebral disc degeneration, lumbosacral region: Secondary | ICD-10-CM | POA: Diagnosis not present

## 2012-07-09 DIAGNOSIS — M5430 Sciatica, unspecified side: Secondary | ICD-10-CM | POA: Diagnosis not present

## 2012-07-09 DIAGNOSIS — N390 Urinary tract infection, site not specified: Secondary | ICD-10-CM | POA: Diagnosis not present

## 2012-07-09 DIAGNOSIS — M999 Biomechanical lesion, unspecified: Secondary | ICD-10-CM | POA: Diagnosis not present

## 2012-07-09 DIAGNOSIS — I10 Essential (primary) hypertension: Secondary | ICD-10-CM | POA: Diagnosis not present

## 2012-07-09 DIAGNOSIS — E785 Hyperlipidemia, unspecified: Secondary | ICD-10-CM | POA: Diagnosis not present

## 2012-07-09 DIAGNOSIS — M1990 Unspecified osteoarthritis, unspecified site: Secondary | ICD-10-CM | POA: Diagnosis not present

## 2012-07-18 ENCOUNTER — Ambulatory Visit (INDEPENDENT_AMBULATORY_CARE_PROVIDER_SITE_OTHER): Payer: Medicare Other | Admitting: Ophthalmology

## 2012-07-22 DIAGNOSIS — M79609 Pain in unspecified limb: Secondary | ICD-10-CM | POA: Diagnosis not present

## 2012-07-22 DIAGNOSIS — M2040 Other hammer toe(s) (acquired), unspecified foot: Secondary | ICD-10-CM | POA: Diagnosis not present

## 2012-07-22 DIAGNOSIS — L851 Acquired keratosis [keratoderma] palmaris et plantaris: Secondary | ICD-10-CM | POA: Diagnosis not present

## 2012-07-23 DIAGNOSIS — M5137 Other intervertebral disc degeneration, lumbosacral region: Secondary | ICD-10-CM | POA: Diagnosis not present

## 2012-07-23 DIAGNOSIS — M999 Biomechanical lesion, unspecified: Secondary | ICD-10-CM | POA: Diagnosis not present

## 2012-07-23 DIAGNOSIS — M5430 Sciatica, unspecified side: Secondary | ICD-10-CM | POA: Diagnosis not present

## 2012-07-30 ENCOUNTER — Ambulatory Visit (INDEPENDENT_AMBULATORY_CARE_PROVIDER_SITE_OTHER): Payer: Medicare Other | Admitting: Ophthalmology

## 2012-08-11 ENCOUNTER — Ambulatory Visit (INDEPENDENT_AMBULATORY_CARE_PROVIDER_SITE_OTHER): Payer: Medicare Other | Admitting: Ophthalmology

## 2012-08-25 ENCOUNTER — Telehealth: Payer: Self-pay | Admitting: Orthopedic Surgery

## 2012-08-25 ENCOUNTER — Other Ambulatory Visit: Payer: Self-pay | Admitting: *Deleted

## 2012-08-25 MED ORDER — GABAPENTIN 100 MG PO CAPS: 100.0000 mg | ORAL_CAPSULE | Freq: Every day | ORAL | Status: DC

## 2012-08-25 NOTE — Telephone Encounter (Signed)
Cindy Schultz  [facility] [place] due to her copay being very high at [facility]. She wants a new prescription for Gabapentin 100mg  (takes 1 daily at bedtime) [facility].

## 2012-08-25 NOTE — Telephone Encounter (Signed)
Tammy, did you get this?

## 2012-08-25 NOTE — Telephone Encounter (Signed)
Faxed [facility] to [facility] 

## 2012-08-25 NOTE — Telephone Encounter (Signed)
Ok to refill at new pharmacy

## 2012-08-27 ENCOUNTER — Ambulatory Visit (INDEPENDENT_AMBULATORY_CARE_PROVIDER_SITE_OTHER): Payer: Medicare Other | Admitting: Ophthalmology

## 2012-08-27 DIAGNOSIS — H348392 Tributary (branch) retinal vein occlusion, unspecified eye, stable: Secondary | ICD-10-CM

## 2012-08-27 DIAGNOSIS — I10 Essential (primary) hypertension: Secondary | ICD-10-CM

## 2012-08-27 DIAGNOSIS — H3530 Unspecified macular degeneration: Secondary | ICD-10-CM

## 2012-08-27 DIAGNOSIS — H43819 Vitreous degeneration, unspecified eye: Secondary | ICD-10-CM

## 2012-08-27 DIAGNOSIS — H35039 Hypertensive retinopathy, unspecified eye: Secondary | ICD-10-CM

## 2012-08-28 ENCOUNTER — Telehealth: Payer: Self-pay | Admitting: Orthopedic Surgery

## 2012-08-28 NOTE — Telephone Encounter (Signed)
Tammy, will you check on the Gabapentin refill for Mrs. Cindy Schultz.  I saw the note in her chart that you faxed  It, but Mrs. Krane said the Pharmacy said they did no have it.

## 2012-08-29 NOTE — Telephone Encounter (Signed)
Faxed prescription [facility] to [facility]

## 2012-09-12 ENCOUNTER — Encounter: Payer: Self-pay | Admitting: *Deleted

## 2012-09-18 ENCOUNTER — Ambulatory Visit: Payer: Medicare Other | Admitting: Orthopedic Surgery

## 2012-09-18 DIAGNOSIS — M5137 Other intervertebral disc degeneration, lumbosacral region: Secondary | ICD-10-CM | POA: Diagnosis not present

## 2012-09-18 DIAGNOSIS — M999 Biomechanical lesion, unspecified: Secondary | ICD-10-CM | POA: Diagnosis not present

## 2012-09-18 DIAGNOSIS — M5430 Sciatica, unspecified side: Secondary | ICD-10-CM | POA: Diagnosis not present

## 2012-09-25 ENCOUNTER — Ambulatory Visit: Payer: Medicare Other | Admitting: Orthopedic Surgery

## 2012-10-07 ENCOUNTER — Ambulatory Visit: Payer: Medicare Other | Admitting: Orthopedic Surgery

## 2012-10-07 DIAGNOSIS — N318 Other neuromuscular dysfunction of bladder: Secondary | ICD-10-CM | POA: Diagnosis not present

## 2012-10-07 DIAGNOSIS — E785 Hyperlipidemia, unspecified: Secondary | ICD-10-CM | POA: Diagnosis not present

## 2012-10-07 DIAGNOSIS — M1990 Unspecified osteoarthritis, unspecified site: Secondary | ICD-10-CM | POA: Diagnosis not present

## 2012-10-07 DIAGNOSIS — I10 Essential (primary) hypertension: Secondary | ICD-10-CM | POA: Diagnosis not present

## 2012-10-22 ENCOUNTER — Ambulatory Visit (INDEPENDENT_AMBULATORY_CARE_PROVIDER_SITE_OTHER): Payer: Medicare Other | Admitting: Orthopedic Surgery

## 2012-10-22 ENCOUNTER — Encounter: Payer: Self-pay | Admitting: Orthopedic Surgery

## 2012-10-22 ENCOUNTER — Ambulatory Visit (INDEPENDENT_AMBULATORY_CARE_PROVIDER_SITE_OTHER): Payer: Medicare Other

## 2012-10-22 VITALS — BP 124/58 | Ht 62.0 in | Wt 128.0 lb

## 2012-10-22 DIAGNOSIS — M19049 Primary osteoarthritis, unspecified hand: Secondary | ICD-10-CM | POA: Diagnosis not present

## 2012-10-22 DIAGNOSIS — M19031 Primary osteoarthritis, right wrist: Secondary | ICD-10-CM

## 2012-10-22 DIAGNOSIS — M6530 Trigger finger, unspecified finger: Secondary | ICD-10-CM

## 2012-10-22 DIAGNOSIS — G5600 Carpal tunnel syndrome, unspecified upper limb: Secondary | ICD-10-CM | POA: Diagnosis not present

## 2012-10-22 DIAGNOSIS — M65311 Trigger thumb, right thumb: Secondary | ICD-10-CM | POA: Insufficient documentation

## 2012-10-22 DIAGNOSIS — M79609 Pain in unspecified limb: Secondary | ICD-10-CM

## 2012-10-22 DIAGNOSIS — M79646 Pain in unspecified finger(s): Secondary | ICD-10-CM | POA: Insufficient documentation

## 2012-10-22 DIAGNOSIS — M654 Radial styloid tenosynovitis [de Quervain]: Secondary | ICD-10-CM | POA: Diagnosis not present

## 2012-10-22 DIAGNOSIS — M79644 Pain in right finger(s): Secondary | ICD-10-CM

## 2012-10-22 NOTE — Progress Notes (Signed)
Patient ID: Cindy Schultz, female   DOB: 31-Jul-1923, 79 y.o.   MRN: [id number] Chief Complaint  Patient presents with  . Hand Pain    Right thumb pain and swelling   79 year old female with a two-week history of pain in her right thumb. She reports catching and locking pain at the base of the joint numbness and tingling of both hands worse on the right inability to perform fine motor tasks such as opening a door. Pain is located at the base of the thumb palmar aspect at the base of the thumb and over the A1 pulley  She tried Aspercreme didn't help  Pain described as dull throbbing 8/10 and constant. Associated swelling as well  Review of systems she's had some fatigue blurred vision snoring cough constipation blood in the stool frequency urgency itching numbness tingling unsteady gait anxiety easy bleeding easy bruising temperature intolerance and seasonal allergies she did deny chest pain  She is allergic to Phenergan. She has a medical history of glaucoma, macular degeneration hearing impaired, hypertension, intestinal bleeding, osteoporosis, hair loss, high cholesterol, sinusitis and chronic allergies.  Previous surgery includes tracheostomy, ovarian cystectomy, bladder tacking, hemorrhoidectomy, cataract removal eyelid surgery knee replacement knee arthroscopy corneal erosion surgery  Family physician Dr. Sinda Du  Current medications are nifedipine, lisinopril, gabapentin, fluticasone, biotin, calcium, Tylenol, oxybutynin,lananoprost  BP 124/58  Ht 5\' 2"  (1.575 m)  Wt 128 lb (58.06 kg)  BMI 23.41 kg/m2 General appearance is normal, the patient is alert and oriented x3 with normal mood and affect. Body habitus ectomorphic  Gait is slow stride length is short cadence is slow as well  Examination of the right thumb she has mild subluxation of the [facility] joint with tenderness and crepitance and also pain over the A1 pulley with clicking she has decreased opposition, weak  opposition, the joint is subluxated well but not unstable pinch strength is weak skin is intact pulses good color is normal capillary refill is normal decreased sensation in the tips of the fingers no pain over the carpal tunnel no tenderness there.  X-ray show basilar joint arthritis IP joint arthritis in the right thumb  Encounter Diagnoses  Name Primary?  . Thumb pain, right Yes  . De Quervain's syndrome (tenosynovitis)   . [facility] DJD(carpometacarpal degenerative joint disease), localized primary, right   . CTS (carpal tunnel syndrome), unspecified laterality   . Trigger thumb of right hand     Recommend thumb immobilization splint continue Aspercreme return 6 weeks if no improvement recommend A1 pulley injection and CMC injection

## 2012-10-24 DIAGNOSIS — M5430 Sciatica, unspecified side: Secondary | ICD-10-CM | POA: Diagnosis not present

## 2012-10-24 DIAGNOSIS — M5137 Other intervertebral disc degeneration, lumbosacral region: Secondary | ICD-10-CM | POA: Diagnosis not present

## 2012-10-24 DIAGNOSIS — M999 Biomechanical lesion, unspecified: Secondary | ICD-10-CM | POA: Diagnosis not present

## 2012-10-27 ENCOUNTER — Other Ambulatory Visit: Payer: Self-pay | Admitting: *Deleted

## 2012-10-27 DIAGNOSIS — M1710 Unilateral primary osteoarthritis, unspecified knee: Secondary | ICD-10-CM

## 2012-10-27 MED ORDER — GABAPENTIN 100 MG PO CAPS: 100.0000 mg | ORAL_CAPSULE | Freq: Every day | ORAL | Status: DC

## 2012-10-30 DIAGNOSIS — S6980XA Other specified injuries of unspecified wrist, hand and finger(s), initial encounter: Secondary | ICD-10-CM | POA: Diagnosis not present

## 2012-10-30 DIAGNOSIS — G5600 Carpal tunnel syndrome, unspecified upper limb: Secondary | ICD-10-CM | POA: Diagnosis not present

## 2012-10-30 DIAGNOSIS — S6990XA Unspecified injury of unspecified wrist, hand and finger(s), initial encounter: Secondary | ICD-10-CM | POA: Diagnosis not present

## 2012-10-30 DIAGNOSIS — I10 Essential (primary) hypertension: Secondary | ICD-10-CM | POA: Diagnosis not present

## 2012-11-05 DIAGNOSIS — M1990 Unspecified osteoarthritis, unspecified site: Secondary | ICD-10-CM | POA: Diagnosis not present

## 2012-11-05 DIAGNOSIS — M65839 Other synovitis and tenosynovitis, unspecified forearm: Secondary | ICD-10-CM | POA: Diagnosis not present

## 2012-11-05 DIAGNOSIS — G5600 Carpal tunnel syndrome, unspecified upper limb: Secondary | ICD-10-CM | POA: Diagnosis not present

## 2012-11-05 DIAGNOSIS — D649 Anemia, unspecified: Secondary | ICD-10-CM | POA: Diagnosis not present

## 2012-11-05 DIAGNOSIS — I10 Essential (primary) hypertension: Secondary | ICD-10-CM | POA: Diagnosis not present

## 2012-11-18 DIAGNOSIS — L570 Actinic keratosis: Secondary | ICD-10-CM | POA: Diagnosis not present

## 2012-11-18 DIAGNOSIS — L820 Inflamed seborrheic keratosis: Secondary | ICD-10-CM | POA: Diagnosis not present

## 2012-11-18 DIAGNOSIS — Z85828 Personal history of other malignant neoplasm of skin: Secondary | ICD-10-CM | POA: Diagnosis not present

## 2012-11-19 DIAGNOSIS — L989 Disorder of the skin and subcutaneous tissue, unspecified: Secondary | ICD-10-CM | POA: Diagnosis not present

## 2012-11-19 DIAGNOSIS — I10 Essential (primary) hypertension: Secondary | ICD-10-CM | POA: Diagnosis not present

## 2012-11-19 DIAGNOSIS — E785 Hyperlipidemia, unspecified: Secondary | ICD-10-CM | POA: Diagnosis not present

## 2012-11-19 DIAGNOSIS — D649 Anemia, unspecified: Secondary | ICD-10-CM | POA: Diagnosis not present

## 2012-11-20 ENCOUNTER — Ambulatory Visit: Payer: Medicare Other | Admitting: Internal Medicine

## 2012-11-26 DIAGNOSIS — H353190 Nonexudative age-related macular degeneration, unspecified eye, stage unspecified: Secondary | ICD-10-CM | POA: Diagnosis not present

## 2012-11-26 DIAGNOSIS — H4011X0 Primary open-angle glaucoma, stage unspecified: Secondary | ICD-10-CM | POA: Diagnosis not present

## 2012-11-26 DIAGNOSIS — H43819 Vitreous degeneration, unspecified eye: Secondary | ICD-10-CM | POA: Diagnosis not present

## 2012-11-26 DIAGNOSIS — H5210 Myopia, unspecified eye: Secondary | ICD-10-CM | POA: Diagnosis not present

## 2012-12-04 ENCOUNTER — Ambulatory Visit (INDEPENDENT_AMBULATORY_CARE_PROVIDER_SITE_OTHER): Payer: Medicare Other | Admitting: Orthopedic Surgery

## 2012-12-04 VITALS — BP 98/58 | Ht 62.0 in | Wt 128.0 lb

## 2012-12-04 DIAGNOSIS — M6530 Trigger finger, unspecified finger: Secondary | ICD-10-CM | POA: Diagnosis not present

## 2012-12-04 DIAGNOSIS — G5600 Carpal tunnel syndrome, unspecified upper limb: Secondary | ICD-10-CM | POA: Diagnosis not present

## 2012-12-04 DIAGNOSIS — M19049 Primary osteoarthritis, unspecified hand: Secondary | ICD-10-CM

## 2012-12-04 DIAGNOSIS — M19031 Primary osteoarthritis, right wrist: Secondary | ICD-10-CM

## 2012-12-04 DIAGNOSIS — M65311 Trigger thumb, right thumb: Secondary | ICD-10-CM

## 2012-12-04 NOTE — Progress Notes (Signed)
Patient ID: Cindy Schultz, female   DOB: 02/02/1924, 79 y.o.   MRN: [id number] Chief Complaint  Patient presents with  . Follow-up    6 week recheck De Quervain's syndrome    79 year old female with a two-week history of pain in her right thumb.  De Quervain's syndrome (tenosynovitis) .  [facility] DJD(carpometacarpal degenerative joint disease), localized primary, right    .  CTS (carpal tunnel syndrome), Trigger thumb of right hand     She returns after splinting with a thumb spica splint with improvement in symptoms she also received some prednisone which seems to help but she still has pain at the [facility] joint  She has some numbness and tingling on the left hand as well as the right. She has a history of cervical disc disease but is not radiating so we suspect that this is carpal tunnel  General appearance is normal, the patient is alert and oriented x3 with normal mood and affect. BP 98/58  Ht 5\' 2"  (1.575 m)  Wt 128 lb (58.06 kg)  BMI 23.41 kg/m2 Tenderness and painful range of motion of the [facility] joint with weak pinch neurovascular exam intact  Persistent CMC DJD  Injection CMC joint   Procedure note CMC arthritis Depo-Medrol 40 mg Lidocaine and 1 cc 1% Alcohol prep Ethyl chloride anesthesia  Right CMC joint injected  Verbal consent, the timeout, 1-1 mixture of Depo-Medrol and lidocaine injected into the [facility] joint no complications

## 2012-12-10 ENCOUNTER — Encounter: Payer: Self-pay | Admitting: Internal Medicine

## 2012-12-10 ENCOUNTER — Ambulatory Visit (INDEPENDENT_AMBULATORY_CARE_PROVIDER_SITE_OTHER): Payer: Medicare Other | Admitting: Internal Medicine

## 2012-12-10 VITALS — BP 122/68 | HR 58 | Ht 62.0 in | Wt 132.0 lb

## 2012-12-10 DIAGNOSIS — I10 Essential (primary) hypertension: Secondary | ICD-10-CM

## 2012-12-10 NOTE — Patient Instructions (Addendum)
Please see Dr. Debara Pickett as needed.

## 2012-12-10 NOTE — Progress Notes (Signed)
OFFICE NOTE  Chief Complaint:  Routine followup  Primary Care Physician: Alonza Bogus, MD  HPI:  Cindy Schultz is an 79 year old female with a history of lower extremity edema thought to be due to lymphedema. She also had a superficial DVT, which she was on aspirin for. She had an ultrasound, which showed no evidence of venous reflux, and I have maintained her on lower extremity compression stockings for that. Unfortunately, she recently had an episode of lower GI bleeding, thought due to diverticulosis, which was almost life threatening. She was on apparently Naproxen, as well as aspirin, both of which have been stopped by you, and she has had no reoccurrences. Overall she's been doing very well. She reports no further problems with her legs in the stopped wearing her stockings due to improvement in her edema her blood pressure remains well-controlled. There are no other cardiac complaints.   PMHx:  Past Medical History  Diagnosis Date  . Macular degeneration   . Glaucoma   . Precancerous changes of the cervix     lesions  . Rectocele   . Diverticulitis   . Lumbar spine pain   . Pain in joints   . Hearing loss   . HTN (hypertension)     cholesterol  . Lymphedema   . DVT (deep venous thrombosis) 07/14/10    LEA doppler   . SOB (shortness of breath) 04/04/2010    2D echo EF=>55%    Past Surgical History  Procedure Laterality Date  . Salk    . Tracheotomy    . Fallen uterus    . Ovarian cyst surgery    . Bladder tac    . Hemmorrhoidectomy    . Met osteostomy    . Left total knee replacement  05/02/04    [name]  . Morton's nueroma    . Elevated metatarsal arch    . Rupture rt groin    . Appendix    . Rectocele repair    . Corneal erosion      cataracts   . Colonoscopy  09/20/2011    Procedure: COLONOSCOPY;  Surgeon: Beryle Beams, MD;  Location: [facility] ENDOSCOPY;  Service: Endoscopy;  Laterality: N/A;    FAMHx:  Family History  Problem Relation Age of Onset    . Stroke Mother   . Diabetes Mother   . Heart attack Father   . COPD Paternal Grandfather   . Dementia Sister     SOCHx:   reports that she has never smoked. She does not have any smokeless tobacco history on file. She reports that she does not drink alcohol or use illicit drugs.  ALLERGIES:  Allergies  Allergen Reactions  . Codeine Nausea And Vomiting  . Hydrocodone Other (See Comments)    Unknown Reaction   . Promethazine Hcl     REACTION: deathly sick  . Tramadol Hcl Other (See Comments)    Motion Sickness    ROS: A comprehensive review of systems was negative except for: Constitutional: positive for fatigue  HOME MEDS: Current Outpatient Prescriptions  Medication Sig Dispense Refill  . acetaminophen (TYLENOL) 500 MG tablet Take 500 mg by mouth every 6 (six) hours as needed.      . Calcium 600-200 MG-UNIT per tablet Take 1 tablet by mouth daily.        . colesevelam (WELCHOL) 625 MG tablet Take 1,875 mg by mouth 2 (two) times daily with a meal.        . dorzolamide-timolol (COSOPT) 22.3-6.8  MG/ML ophthalmic solution Place 1 drop into both eyes 2 (two) times daily.        . ferrous sulfate 325 (65 FE) MG tablet Take 325 mg by mouth daily with breakfast.      . fluticasone (FLONASE) 50 MCG/ACT nasal spray Place 2 sprays into the nose daily.        Marland Kitchen gabapentin (NEURONTIN) 100 MG capsule Take 1 capsule (100 mg total) by mouth at bedtime.  30 capsule  5  . glucosamine-chondroitin 500-400 MG tablet Take 1 tablet by mouth 3 (three) times daily.      Marland Kitchen latanoprost (XALATAN) 0.005 % ophthalmic solution Place 1 drop into the right eye at bedtime.       Marland Kitchen lisinopril (PRINIVIL,ZESTRIL) 20 MG tablet Take 1 tablet by mouth daily.      . Multiple Vitamins-Minerals (OCUVITE PO) Take 1 tablet by mouth daily.      . mupirocin ointment (BACTROBAN) 2 % Apply 1 application topically 2 (two) times daily as needed.      Marland Kitchen NIFEdipine (PROCARDIA-XL/ADALAT CC) 30 MG 24 hr tablet Take 30 mg by  mouth daily.       Marland Kitchen oxybutynin (DITROPAN-XL) 5 MG 24 hr tablet Take 5 mg by mouth daily.      Vladimir Faster Glycol-Propyl Glycol (SYSTANE ULTRA OP) Apply 1 drop to eye 3 (three) times daily.        No current facility-administered medications for this visit.    LABS/IMAGING: No results found for this or any previous visit (from the past 48 hour(s)). No results found.  VITALS: BP 122/68  Pulse 58  Ht 5\' 2"  (1.575 m)  Wt 132 lb (59.875 kg)  BMI 24.14 kg/m2  EXAM: General appearance: alert and no distress Neck: no adenopathy, no carotid bruit, no JVD, supple, symmetrical, trachea midline and thyroid not enlarged, symmetric, no tenderness/mass/nodules Lungs: clear to auscultation bilaterally Heart: regular rate and rhythm, S1, S2 normal, no murmur, click, rub or gallop Abdomen: soft, non-tender; bowel sounds normal; no masses,  no organomegaly Extremities: extremities normal, atraumatic, no cyanosis or edema Pulses: 2+ and symmetric Skin: Skin color, texture, turgor normal. No rashes or lesions Neurologic: Grossly normal  EKG: Sinus bradycardia at 58  ASSESSMENT: 1. Hypertension-controlled 2. Lymphedema 3. Superficial phlebitis 4. History of diverticular bleeding-anticoagulates contraindicated  PLAN: 1.   [name] is doing well from a cardiovascular standpoint. Her blood pressure is well controlled. At times she actually reports some low blood pressure and you have been adjusting her medicines accordingly. From our standpoint there are a few cardiac issues I would recommend seeing her back only as needed.  Pixie Casino, MD, [facility] Attending Cardiologist [facility] [place] [facility] 12/10/2012, 1:49 PM

## 2012-12-11 ENCOUNTER — Ambulatory Visit: Payer: Medicare Other | Admitting: Internal Medicine

## 2012-12-24 ENCOUNTER — Ambulatory Visit (HOSPITAL_COMMUNITY)
Admission: RE | Admit: 2012-12-24 | Discharge: 2012-12-24 | Disposition: A | Discharge status: Home / Self Care | Payer: Medicare Other | Source: Ambulatory Visit | Attending: Pulmonary Disease | Admitting: Pulmonary Disease

## 2012-12-24 ENCOUNTER — Other Ambulatory Visit (HOSPITAL_COMMUNITY): Payer: Self-pay | Admitting: Pulmonary Disease

## 2012-12-24 DIAGNOSIS — M25579 Pain in unspecified ankle and joints of unspecified foot: Secondary | ICD-10-CM | POA: Diagnosis not present

## 2012-12-24 DIAGNOSIS — M25572 Pain in left ankle and joints of left foot: Secondary | ICD-10-CM

## 2012-12-24 DIAGNOSIS — I10 Essential (primary) hypertension: Secondary | ICD-10-CM | POA: Diagnosis not present

## 2012-12-24 DIAGNOSIS — M79609 Pain in unspecified limb: Secondary | ICD-10-CM | POA: Diagnosis not present

## 2012-12-31 DIAGNOSIS — L723 Sebaceous cyst: Secondary | ICD-10-CM | POA: Diagnosis not present

## 2012-12-31 DIAGNOSIS — D235 Other benign neoplasm of skin of trunk: Secondary | ICD-10-CM | POA: Diagnosis not present

## 2012-12-31 DIAGNOSIS — L821 Other seborrheic keratosis: Secondary | ICD-10-CM | POA: Diagnosis not present

## 2012-12-31 DIAGNOSIS — L570 Actinic keratosis: Secondary | ICD-10-CM | POA: Diagnosis not present

## 2013-01-07 DIAGNOSIS — E785 Hyperlipidemia, unspecified: Secondary | ICD-10-CM | POA: Diagnosis not present

## 2013-01-07 DIAGNOSIS — G5600 Carpal tunnel syndrome, unspecified upper limb: Secondary | ICD-10-CM | POA: Diagnosis not present

## 2013-01-07 DIAGNOSIS — D649 Anemia, unspecified: Secondary | ICD-10-CM | POA: Diagnosis not present

## 2013-01-07 DIAGNOSIS — I10 Essential (primary) hypertension: Secondary | ICD-10-CM | POA: Diagnosis not present

## 2013-01-15 ENCOUNTER — Ambulatory Visit (INDEPENDENT_AMBULATORY_CARE_PROVIDER_SITE_OTHER): Payer: Medicare Other | Admitting: Orthopedic Surgery

## 2013-01-15 ENCOUNTER — Encounter: Payer: Self-pay | Admitting: Orthopedic Surgery

## 2013-01-15 VITALS — BP 96/50 | Ht 62.0 in | Wt 128.0 lb

## 2013-01-15 DIAGNOSIS — M79644 Pain in right finger(s): Secondary | ICD-10-CM

## 2013-01-15 DIAGNOSIS — M79609 Pain in unspecified limb: Secondary | ICD-10-CM

## 2013-01-15 NOTE — Patient Instructions (Addendum)
CONTINUE BRACE AS NEEDED

## 2013-01-15 NOTE — Progress Notes (Signed)
Patient ID: [name], female   DOB: 11-Jun-1924, 79 y.o.   MRN: [id number] Chief Complaint  Patient presents with  . Follow-up    6 week recheck De Quervain's syndrome Right     De Quervain's syndrome (tenosynovitis)    .  [facility] DJD(carpometacarpal degenerative joint disease), localized primary, right    .  CTS (carpal tunnel syndrome), unspecified laterality    .  Trigger thumb of right hand   Followup visit the patient says she did get some relief from the injection but the pain seems to have returned however she's not interested in any surgery so we've advised her to have an injection in 6 months as needed and then continue bracing

## 2013-03-09 ENCOUNTER — Ambulatory Visit (INDEPENDENT_AMBULATORY_CARE_PROVIDER_SITE_OTHER): Payer: Medicare Other | Admitting: Ophthalmology

## 2013-03-09 DIAGNOSIS — H3530 Unspecified macular degeneration: Secondary | ICD-10-CM

## 2013-03-09 DIAGNOSIS — H35039 Hypertensive retinopathy, unspecified eye: Secondary | ICD-10-CM

## 2013-03-09 DIAGNOSIS — H348392 Tributary (branch) retinal vein occlusion, unspecified eye, stable: Secondary | ICD-10-CM

## 2013-03-09 DIAGNOSIS — I10 Essential (primary) hypertension: Secondary | ICD-10-CM | POA: Diagnosis not present

## 2013-03-09 DIAGNOSIS — H43819 Vitreous degeneration, unspecified eye: Secondary | ICD-10-CM

## 2013-03-12 DIAGNOSIS — Z23 Encounter for immunization: Secondary | ICD-10-CM | POA: Diagnosis not present

## 2013-03-17 DIAGNOSIS — N819 Female genital prolapse, unspecified: Secondary | ICD-10-CM | POA: Diagnosis not present

## 2013-03-30 DIAGNOSIS — K4090 Unilateral inguinal hernia, without obstruction or gangrene, not specified as recurrent: Secondary | ICD-10-CM | POA: Diagnosis not present

## 2013-04-08 DIAGNOSIS — M1990 Unspecified osteoarthritis, unspecified site: Secondary | ICD-10-CM | POA: Diagnosis not present

## 2013-04-08 DIAGNOSIS — I10 Essential (primary) hypertension: Secondary | ICD-10-CM | POA: Diagnosis not present

## 2013-04-08 DIAGNOSIS — E785 Hyperlipidemia, unspecified: Secondary | ICD-10-CM | POA: Diagnosis not present

## 2013-04-21 ENCOUNTER — Other Ambulatory Visit: Payer: Self-pay | Admitting: *Deleted

## 2013-04-21 DIAGNOSIS — M1710 Unilateral primary osteoarthritis, unspecified knee: Secondary | ICD-10-CM

## 2013-04-21 MED ORDER — GABAPENTIN 100 MG PO CAPS: 100.0000 mg | ORAL_CAPSULE | Freq: Every day | ORAL | Status: DC

## 2013-04-23 ENCOUNTER — Ambulatory Visit (INDEPENDENT_AMBULATORY_CARE_PROVIDER_SITE_OTHER): Payer: Medicare Other

## 2013-04-23 ENCOUNTER — Encounter: Payer: Self-pay | Admitting: Orthopedic Surgery

## 2013-04-23 ENCOUNTER — Ambulatory Visit (INDEPENDENT_AMBULATORY_CARE_PROVIDER_SITE_OTHER): Payer: Medicare Other | Admitting: Orthopedic Surgery

## 2013-04-23 VITALS — BP 128/79 | Ht 62.0 in | Wt 129.0 lb

## 2013-04-23 DIAGNOSIS — M1710 Unilateral primary osteoarthritis, unspecified knee: Secondary | ICD-10-CM

## 2013-04-23 DIAGNOSIS — Z96659 Presence of unspecified artificial knee joint: Secondary | ICD-10-CM | POA: Diagnosis not present

## 2013-04-23 DIAGNOSIS — M1712 Unilateral primary osteoarthritis, left knee: Secondary | ICD-10-CM

## 2013-04-23 DIAGNOSIS — Z96652 Presence of left artificial knee joint: Secondary | ICD-10-CM

## 2013-04-23 NOTE — Progress Notes (Signed)
Patient ID: Cindy Schultz, female   DOB: March 03, 1924, 79 y.o.   MRN: [id number]  Chief Complaint  Patient presents with  . Follow-up    2 year recheck on left knee replacement. DOS 05-02-04.    BP 128/79  Ht 5\' 2"  (1.575 m)  Wt 129 lb (58.514 kg)  BMI 23.59 kg/m2  Encounter Diagnoses  Name Primary?  . Status post left knee replacement Yes  . TOTAL KNEE FOLLOW-UP   . Osteoarthritis of left knee     HISTORY:  Knee replacement in 2005. Doing well no problems. Review of systems right knee pain.  X-rays show stable prosthesis and 9 years  Clinical exam shows a well-developed well-nourished female oriented x3 normal mood gait supported by a cane in full extension knee flexion 115 knee stable motor exam normal  Stable prosthesis and 9 years followup 2 years x-ray

## 2013-04-23 NOTE — Patient Instructions (Signed)
Use your cane   You have a LIPOMA (if you want it removed call Dr Romona Curls for appointment to evaluate it)

## 2013-04-24 DIAGNOSIS — N814 Uterovaginal prolapse, unspecified: Secondary | ICD-10-CM | POA: Diagnosis not present

## 2013-04-24 DIAGNOSIS — K4090 Unilateral inguinal hernia, without obstruction or gangrene, not specified as recurrent: Secondary | ICD-10-CM | POA: Diagnosis not present

## 2013-04-24 DIAGNOSIS — H409 Unspecified glaucoma: Secondary | ICD-10-CM | POA: Diagnosis not present

## 2013-04-24 DIAGNOSIS — N993 Prolapse of vaginal vault after hysterectomy: Secondary | ICD-10-CM | POA: Diagnosis not present

## 2013-04-24 DIAGNOSIS — D259 Leiomyoma of uterus, unspecified: Secondary | ICD-10-CM | POA: Diagnosis not present

## 2013-04-24 DIAGNOSIS — E7800 Pure hypercholesterolemia, unspecified: Secondary | ICD-10-CM | POA: Diagnosis not present

## 2013-04-24 DIAGNOSIS — Z888 Allergy status to other drugs, medicaments and biological substances status: Secondary | ICD-10-CM | POA: Diagnosis not present

## 2013-04-24 DIAGNOSIS — M810 Age-related osteoporosis without current pathological fracture: Secondary | ICD-10-CM | POA: Diagnosis not present

## 2013-04-24 DIAGNOSIS — D62 Acute posthemorrhagic anemia: Secondary | ICD-10-CM | POA: Diagnosis not present

## 2013-04-24 DIAGNOSIS — M549 Dorsalgia, unspecified: Secondary | ICD-10-CM | POA: Diagnosis not present

## 2013-04-24 DIAGNOSIS — N859 Noninflammatory disorder of uterus, unspecified: Secondary | ICD-10-CM | POA: Diagnosis not present

## 2013-04-24 DIAGNOSIS — Z885 Allergy status to narcotic agent status: Secondary | ICD-10-CM | POA: Diagnosis not present

## 2013-04-24 DIAGNOSIS — Z79899 Other long term (current) drug therapy: Secondary | ICD-10-CM | POA: Diagnosis not present

## 2013-04-24 DIAGNOSIS — I10 Essential (primary) hypertension: Secondary | ICD-10-CM | POA: Diagnosis not present

## 2013-04-24 DIAGNOSIS — N819 Female genital prolapse, unspecified: Secondary | ICD-10-CM | POA: Diagnosis not present

## 2013-04-24 DIAGNOSIS — K469 Unspecified abdominal hernia without obstruction or gangrene: Secondary | ICD-10-CM | POA: Diagnosis not present

## 2013-04-28 DIAGNOSIS — N814 Uterovaginal prolapse, unspecified: Secondary | ICD-10-CM | POA: Diagnosis not present

## 2013-04-30 DIAGNOSIS — M549 Dorsalgia, unspecified: Secondary | ICD-10-CM | POA: Diagnosis not present

## 2013-04-30 DIAGNOSIS — N993 Prolapse of vaginal vault after hysterectomy: Secondary | ICD-10-CM | POA: Diagnosis not present

## 2013-04-30 DIAGNOSIS — I10 Essential (primary) hypertension: Secondary | ICD-10-CM | POA: Diagnosis not present

## 2013-04-30 DIAGNOSIS — K469 Unspecified abdominal hernia without obstruction or gangrene: Secondary | ICD-10-CM | POA: Diagnosis not present

## 2013-05-01 DIAGNOSIS — R269 Unspecified abnormalities of gait and mobility: Secondary | ICD-10-CM | POA: Diagnosis not present

## 2013-05-01 DIAGNOSIS — R293 Abnormal posture: Secondary | ICD-10-CM | POA: Diagnosis not present

## 2013-05-01 DIAGNOSIS — M6281 Muscle weakness (generalized): Secondary | ICD-10-CM | POA: Diagnosis not present

## 2013-05-01 DIAGNOSIS — R279 Unspecified lack of coordination: Secondary | ICD-10-CM | POA: Diagnosis not present

## 2013-05-01 DIAGNOSIS — R58 Hemorrhage, not elsewhere classified: Secondary | ICD-10-CM | POA: Diagnosis not present

## 2013-05-04 DIAGNOSIS — R293 Abnormal posture: Secondary | ICD-10-CM | POA: Diagnosis not present

## 2013-05-04 DIAGNOSIS — R279 Unspecified lack of coordination: Secondary | ICD-10-CM | POA: Diagnosis not present

## 2013-05-04 DIAGNOSIS — R269 Unspecified abnormalities of gait and mobility: Secondary | ICD-10-CM | POA: Diagnosis not present

## 2013-05-04 DIAGNOSIS — M6281 Muscle weakness (generalized): Secondary | ICD-10-CM | POA: Diagnosis not present

## 2013-05-05 ENCOUNTER — Ambulatory Visit: Payer: Medicare Other | Admitting: Orthopedic Surgery

## 2013-05-05 DIAGNOSIS — R269 Unspecified abnormalities of gait and mobility: Secondary | ICD-10-CM | POA: Diagnosis not present

## 2013-05-05 DIAGNOSIS — R293 Abnormal posture: Secondary | ICD-10-CM | POA: Diagnosis not present

## 2013-05-05 DIAGNOSIS — R279 Unspecified lack of coordination: Secondary | ICD-10-CM | POA: Diagnosis not present

## 2013-05-05 DIAGNOSIS — M6281 Muscle weakness (generalized): Secondary | ICD-10-CM | POA: Diagnosis not present

## 2013-05-06 DIAGNOSIS — R293 Abnormal posture: Secondary | ICD-10-CM | POA: Diagnosis not present

## 2013-05-06 DIAGNOSIS — R269 Unspecified abnormalities of gait and mobility: Secondary | ICD-10-CM | POA: Diagnosis not present

## 2013-05-06 DIAGNOSIS — R279 Unspecified lack of coordination: Secondary | ICD-10-CM | POA: Diagnosis not present

## 2013-05-06 DIAGNOSIS — M6281 Muscle weakness (generalized): Secondary | ICD-10-CM | POA: Diagnosis not present

## 2013-05-07 DIAGNOSIS — R279 Unspecified lack of coordination: Secondary | ICD-10-CM | POA: Diagnosis not present

## 2013-05-07 DIAGNOSIS — R293 Abnormal posture: Secondary | ICD-10-CM | POA: Diagnosis not present

## 2013-05-07 DIAGNOSIS — R269 Unspecified abnormalities of gait and mobility: Secondary | ICD-10-CM | POA: Diagnosis not present

## 2013-05-07 DIAGNOSIS — M6281 Muscle weakness (generalized): Secondary | ICD-10-CM | POA: Diagnosis not present

## 2013-05-08 DIAGNOSIS — R269 Unspecified abnormalities of gait and mobility: Secondary | ICD-10-CM | POA: Diagnosis not present

## 2013-05-08 DIAGNOSIS — M6281 Muscle weakness (generalized): Secondary | ICD-10-CM | POA: Diagnosis not present

## 2013-05-08 DIAGNOSIS — R293 Abnormal posture: Secondary | ICD-10-CM | POA: Diagnosis not present

## 2013-05-08 DIAGNOSIS — R279 Unspecified lack of coordination: Secondary | ICD-10-CM | POA: Diagnosis not present

## 2013-05-10 DIAGNOSIS — R293 Abnormal posture: Secondary | ICD-10-CM | POA: Diagnosis not present

## 2013-05-10 DIAGNOSIS — M6281 Muscle weakness (generalized): Secondary | ICD-10-CM | POA: Diagnosis not present

## 2013-05-10 DIAGNOSIS — R269 Unspecified abnormalities of gait and mobility: Secondary | ICD-10-CM | POA: Diagnosis not present

## 2013-05-10 DIAGNOSIS — R279 Unspecified lack of coordination: Secondary | ICD-10-CM | POA: Diagnosis not present

## 2013-05-11 DIAGNOSIS — R279 Unspecified lack of coordination: Secondary | ICD-10-CM | POA: Diagnosis not present

## 2013-05-11 DIAGNOSIS — M6281 Muscle weakness (generalized): Secondary | ICD-10-CM | POA: Diagnosis not present

## 2013-05-11 DIAGNOSIS — R293 Abnormal posture: Secondary | ICD-10-CM | POA: Diagnosis not present

## 2013-05-11 DIAGNOSIS — R269 Unspecified abnormalities of gait and mobility: Secondary | ICD-10-CM | POA: Diagnosis not present

## 2013-05-12 DIAGNOSIS — M6281 Muscle weakness (generalized): Secondary | ICD-10-CM | POA: Diagnosis not present

## 2013-05-12 DIAGNOSIS — R269 Unspecified abnormalities of gait and mobility: Secondary | ICD-10-CM | POA: Diagnosis not present

## 2013-05-12 DIAGNOSIS — R279 Unspecified lack of coordination: Secondary | ICD-10-CM | POA: Diagnosis not present

## 2013-05-12 DIAGNOSIS — R293 Abnormal posture: Secondary | ICD-10-CM | POA: Diagnosis not present

## 2013-05-13 DIAGNOSIS — R269 Unspecified abnormalities of gait and mobility: Secondary | ICD-10-CM | POA: Diagnosis not present

## 2013-05-13 DIAGNOSIS — M6281 Muscle weakness (generalized): Secondary | ICD-10-CM | POA: Diagnosis not present

## 2013-05-13 DIAGNOSIS — R279 Unspecified lack of coordination: Secondary | ICD-10-CM | POA: Diagnosis not present

## 2013-05-13 DIAGNOSIS — R293 Abnormal posture: Secondary | ICD-10-CM | POA: Diagnosis not present

## 2013-05-14 DIAGNOSIS — M6281 Muscle weakness (generalized): Secondary | ICD-10-CM | POA: Diagnosis not present

## 2013-05-14 DIAGNOSIS — R269 Unspecified abnormalities of gait and mobility: Secondary | ICD-10-CM | POA: Diagnosis not present

## 2013-05-14 DIAGNOSIS — R293 Abnormal posture: Secondary | ICD-10-CM | POA: Diagnosis not present

## 2013-05-14 DIAGNOSIS — R279 Unspecified lack of coordination: Secondary | ICD-10-CM | POA: Diagnosis not present

## 2013-05-15 DIAGNOSIS — R293 Abnormal posture: Secondary | ICD-10-CM | POA: Diagnosis not present

## 2013-05-15 DIAGNOSIS — R279 Unspecified lack of coordination: Secondary | ICD-10-CM | POA: Diagnosis not present

## 2013-05-15 DIAGNOSIS — M6281 Muscle weakness (generalized): Secondary | ICD-10-CM | POA: Diagnosis not present

## 2013-05-15 DIAGNOSIS — R269 Unspecified abnormalities of gait and mobility: Secondary | ICD-10-CM | POA: Diagnosis not present

## 2013-05-28 ENCOUNTER — Ambulatory Visit (INDEPENDENT_AMBULATORY_CARE_PROVIDER_SITE_OTHER): Payer: Medicare Other | Admitting: Otolaryngology

## 2013-05-28 ENCOUNTER — Encounter (INDEPENDENT_AMBULATORY_CARE_PROVIDER_SITE_OTHER): Payer: Self-pay

## 2013-05-28 DIAGNOSIS — H903 Sensorineural hearing loss, bilateral: Secondary | ICD-10-CM

## 2013-06-04 DIAGNOSIS — H35039 Hypertensive retinopathy, unspecified eye: Secondary | ICD-10-CM | POA: Diagnosis not present

## 2013-06-04 DIAGNOSIS — H353190 Nonexudative age-related macular degeneration, unspecified eye, stage unspecified: Secondary | ICD-10-CM | POA: Diagnosis not present

## 2013-06-04 DIAGNOSIS — H43819 Vitreous degeneration, unspecified eye: Secondary | ICD-10-CM | POA: Diagnosis not present

## 2013-06-04 DIAGNOSIS — H4011X0 Primary open-angle glaucoma, stage unspecified: Secondary | ICD-10-CM | POA: Diagnosis not present

## 2013-07-09 DIAGNOSIS — I10 Essential (primary) hypertension: Secondary | ICD-10-CM | POA: Diagnosis not present

## 2013-07-09 DIAGNOSIS — E785 Hyperlipidemia, unspecified: Secondary | ICD-10-CM | POA: Diagnosis not present

## 2013-07-09 DIAGNOSIS — G5600 Carpal tunnel syndrome, unspecified upper limb: Secondary | ICD-10-CM | POA: Diagnosis not present

## 2013-07-09 DIAGNOSIS — M1990 Unspecified osteoarthritis, unspecified site: Secondary | ICD-10-CM | POA: Diagnosis not present

## 2013-07-13 DIAGNOSIS — M5430 Sciatica, unspecified side: Secondary | ICD-10-CM | POA: Diagnosis not present

## 2013-07-13 DIAGNOSIS — M999 Biomechanical lesion, unspecified: Secondary | ICD-10-CM | POA: Diagnosis not present

## 2013-07-13 DIAGNOSIS — M5137 Other intervertebral disc degeneration, lumbosacral region: Secondary | ICD-10-CM | POA: Diagnosis not present

## 2013-07-17 DIAGNOSIS — M5430 Sciatica, unspecified side: Secondary | ICD-10-CM | POA: Diagnosis not present

## 2013-07-17 DIAGNOSIS — M5137 Other intervertebral disc degeneration, lumbosacral region: Secondary | ICD-10-CM | POA: Diagnosis not present

## 2013-07-17 DIAGNOSIS — M999 Biomechanical lesion, unspecified: Secondary | ICD-10-CM | POA: Diagnosis not present

## 2013-07-21 DIAGNOSIS — M999 Biomechanical lesion, unspecified: Secondary | ICD-10-CM | POA: Diagnosis not present

## 2013-07-21 DIAGNOSIS — M5137 Other intervertebral disc degeneration, lumbosacral region: Secondary | ICD-10-CM | POA: Diagnosis not present

## 2013-07-21 DIAGNOSIS — M5430 Sciatica, unspecified side: Secondary | ICD-10-CM | POA: Diagnosis not present

## 2013-07-23 DIAGNOSIS — M999 Biomechanical lesion, unspecified: Secondary | ICD-10-CM | POA: Diagnosis not present

## 2013-07-23 DIAGNOSIS — M5430 Sciatica, unspecified side: Secondary | ICD-10-CM | POA: Diagnosis not present

## 2013-07-23 DIAGNOSIS — M5137 Other intervertebral disc degeneration, lumbosacral region: Secondary | ICD-10-CM | POA: Diagnosis not present

## 2013-07-29 ENCOUNTER — Ambulatory Visit (HOSPITAL_COMMUNITY)
Admission: RE | Admit: 2013-07-29 | Discharge: 2013-07-29 | Disposition: A | Discharge status: Home / Self Care | Payer: Medicare Other | Source: Ambulatory Visit | Attending: Pulmonary Disease | Admitting: Pulmonary Disease

## 2013-07-29 ENCOUNTER — Other Ambulatory Visit (HOSPITAL_COMMUNITY): Payer: Self-pay | Admitting: Pulmonary Disease

## 2013-07-29 DIAGNOSIS — J019 Acute sinusitis, unspecified: Secondary | ICD-10-CM | POA: Diagnosis not present

## 2013-07-29 DIAGNOSIS — M5430 Sciatica, unspecified side: Secondary | ICD-10-CM | POA: Diagnosis not present

## 2013-07-29 DIAGNOSIS — M542 Cervicalgia: Secondary | ICD-10-CM | POA: Diagnosis not present

## 2013-07-29 DIAGNOSIS — M999 Biomechanical lesion, unspecified: Secondary | ICD-10-CM | POA: Diagnosis not present

## 2013-07-29 DIAGNOSIS — R209 Unspecified disturbances of skin sensation: Secondary | ICD-10-CM | POA: Insufficient documentation

## 2013-07-29 DIAGNOSIS — M47817 Spondylosis without myelopathy or radiculopathy, lumbosacral region: Secondary | ICD-10-CM | POA: Diagnosis not present

## 2013-07-29 DIAGNOSIS — M47812 Spondylosis without myelopathy or radiculopathy, cervical region: Secondary | ICD-10-CM | POA: Diagnosis not present

## 2013-07-29 DIAGNOSIS — M5137 Other intervertebral disc degeneration, lumbosacral region: Secondary | ICD-10-CM | POA: Diagnosis not present

## 2013-07-29 DIAGNOSIS — N318 Other neuromuscular dysfunction of bladder: Secondary | ICD-10-CM | POA: Diagnosis not present

## 2013-08-10 DIAGNOSIS — L821 Other seborrheic keratosis: Secondary | ICD-10-CM | POA: Diagnosis not present

## 2013-08-10 DIAGNOSIS — Z85828 Personal history of other malignant neoplasm of skin: Secondary | ICD-10-CM | POA: Diagnosis not present

## 2013-08-10 DIAGNOSIS — L570 Actinic keratosis: Secondary | ICD-10-CM | POA: Diagnosis not present

## 2013-08-10 DIAGNOSIS — D235 Other benign neoplasm of skin of trunk: Secondary | ICD-10-CM | POA: Diagnosis not present

## 2013-08-18 ENCOUNTER — Ambulatory Visit (HOSPITAL_COMMUNITY)
Admission: RE | Admit: 2013-08-18 | Discharge: 2013-08-18 | Disposition: A | Discharge status: Home / Self Care | Payer: Medicare Other | Source: Ambulatory Visit | Attending: Pulmonary Disease | Admitting: Pulmonary Disease

## 2013-08-18 DIAGNOSIS — I10 Essential (primary) hypertension: Secondary | ICD-10-CM | POA: Diagnosis not present

## 2013-08-18 DIAGNOSIS — M6281 Muscle weakness (generalized): Secondary | ICD-10-CM | POA: Insufficient documentation

## 2013-08-18 DIAGNOSIS — M542 Cervicalgia: Secondary | ICD-10-CM | POA: Insufficient documentation

## 2013-08-18 DIAGNOSIS — IMO0001 Reserved for inherently not codable concepts without codable children: Secondary | ICD-10-CM | POA: Insufficient documentation

## 2013-08-18 NOTE — Evaluation (Signed)
Physical Therapy Evaluation  Patient Details  Name: Cindy Schultz MRN: [id number] Date of Birth: August 05, 1923  Today's Date: 08/18/2013 Time: 1015-1100 PT Time Calculation (min): 45 min  Charges evaluation 2924- 4628  MNOT 7711 - 1100             Visit#: 1 of 12  Re-eval: 09/17/13 Assessment Diagnosis: neck pain  Next MD Visit: Dr. Luan Pulling  Prior Therapy: no   Authorization: [employer]     Authorization Time Period:    Authorization Visit#: 1 of 10   Past Medical History:  Past Medical History  Diagnosis Date  . Macular degeneration   . Glaucoma   . Precancerous changes of the cervix     lesions  . Rectocele   . Diverticulitis   . Lumbar spine pain   . Pain in joints   . Hearing loss   . HTN (hypertension)     cholesterol  . Lymphedema   . DVT (deep venous thrombosis) 07/14/10    LEA doppler   . SOB (shortness of breath) 04/04/2010    2D echo EF=>55%   Past Surgical History:  Past Surgical History  Procedure Laterality Date  . Salk    . Tracheotomy    . Fallen uterus    . Ovarian cyst surgery    . Bladder tac    . Hemmorrhoidectomy    . Met osteostomy    . Left total knee replacement  05/02/04    [name]  . Morton's nueroma    . Elevated metatarsal arch    . Rupture rt groin    . Appendix    . Rectocele repair    . Corneal erosion      cataracts   . Colonoscopy  09/20/2011    Procedure: COLONOSCOPY;  Surgeon: Beryle Beams, MD;  Location: [facility] ENDOSCOPY;  Service: Endoscopy;  Laterality: N/A;    Subjective Symptoms/Limitations Symptoms: complains right side of neck sore, trouble turning head, trouble turning head during driving, noise in neck while turning head, R hand numbness at night , mechanism of injury unknown/arthritis, patient states symptoms began abount 3 weeks ago, onset date approximate Feb 15,2015.  Pertinent History: 80 year old , B hearing aids, past dx of carpal tunnel syndrome ?  Knee TKR, macular degeneration  Special Tests: x rays in  chart  Patient Stated Goals: decrease neck soreness  Pain Assessment Currently in Pain?: No/denies, states very sore , constant soreness, numbness in right hand at night     Balance Screening Balance Screen Has the patient fallen in the past 6 months: No Has the patient had a decrease in activity level because of a fear of falling? : Yes Is the patient reluctant to leave their home because of a fear of falling? : No  Prior Functioning  Prior Function Level of Independence: Independent with basic ADLs;Other (comment) (ADLS and driving without neck soreness ) Driving: Yes Vocation: Retired Leisure: Hobbies-yes (Comment) Comments: dance   Cognition/Observation Cognition Overall Cognitive Status: Within Functional Limits for tasks assessed Observation/Other Assessments Observations: glasses, b hearing aides  Other Assessments: gait no device, short steps   Sensation/Coordination/Flexibility/Functional Tests Functional Tests Functional Tests: FOTO score 54   Assessment B UE AROM WFL  Cervical AROM Cervical Flexion: 15 Cervical Extension: 20 Cervical - Right Side Bend: 5  (pain ) Cervical - Left Side Bend: 10  Cervical - Right Rotation: 36  Cervical - Left Rotation: 35 Cervical PROM Cervical - Right Side Bend: 15 Cervical - Left  Side Bend: 15  Cervical - Right Rotation: 40 Cervical - Left Rotation: 40  Palpation Palpation: tender R cervical/scapular region , R trapezius tenderness, tight fascia  Special tests: spurling negative , VOR testing WNL, cervical traction manual  no effect on symptoms  Exercise/Treatments Heat cervical spine 15 min supine post evaluation        Physical Therapy Assessment and Plan PT Assessment and Plan Clinical Impression Statement: 80 year old referred for med dx of neck pain and x ray findings of multi level  neck DJD. She has chief complaints of right cervical soreness and limited AROM turning head. These complaints are consistent with PT  findings. She reports hx of and current problem of right hand numbness at rest, possible carpal tunnel correation as AROM and PROM and cervical compression did not changes hand symptoms. Patient deneid dizziness and falls, however med reports from dr on 07/29/2013 indicated subjectiive of dizziness . Patinet screened WNL for VOR testing and negative for dizziness during bed mobility and cervical ROM, encouraged patient to consult with ear dr and eye dr regarding any hearing or vision changes.  Pt will benefit from skilled therapeutic intervention in order to improve on the following deficits: Impaired flexibility;Decreased range of motion;Pain;Increased fascial restricitons Rehab Potential: Good Clinical Impairments Affecting Rehab Potential: multi level cervical Degenerative changes  PT Frequency: Min 3X/week PT Duration: 4 weeks PT Treatment/Interventions: Therapeutic activities;Therapeutic exercise;Manual techniques;Patient/family education;Neuromuscular re-education;Modalities;Balance training PT Plan: cervical AROM supine and sitting, cervical stretches active and passive, heat, ultrasound / IFC prn for pain/stiffness, progression as toerlated for cervical exercises in standing , furtehr test hand grip strength and sensation as needed     Goals Home Exercise Program Pt/caregiver will Perform Home Exercise Program: For increased ROM PT Goal: Perform Home Exercise Program - Progress: Goal set today PT Short Term Goals Time to Complete Short Term Goals: 2 weeks PT Short Term Goal 1: patient report 25% reduction in neck symptoms for decreased disturbance  with ADLS and continuation of normal routine to prevent loss of independence as patient lives alone  PT Short Term Goal 2: Improve AROM cervical spine side bend left to 10 degrees from 5 degrees for progiression towards overall impropved neck mobility required for comfort during sleeping and driving  PT Long Term Goals Time to Complete Long Term  Goals: 4 weeks PT Long Term Goal 1: report neck soreness as intermittent rather than constant  PT Long Term Goal 2: Improve B cervical rotation to 40 degrees for improved motion in neck during driving, talking with person sitting next to patient, and social activities  Long Term Goal 3: restore normal fasical pattern in R cervical musculature to improve ROM and decrease soreness patterns reported by patient   Problem List Patient Active Problem List   Diagnosis Date Noted  . Osteoarthritis of left knee 04/23/2013  . Thumb pain 10/22/2012  . De Quervain's syndrome (tenosynovitis) 10/22/2012  . [facility] DJD(carpometacarpal degenerative joint disease), localized primary 10/22/2012  . CTS (carpal tunnel syndrome) 10/22/2012  . Trigger thumb of right hand 10/22/2012  . Pes anserinus bursitis of right knee 06/26/2012  . OA (osteoarthritis) of knee 06/26/2012  . Leg pain, left 06/26/2012  . Radicular pain of left lower extremity 06/26/2012  . Diverticulosis of colon with hemorrhage 09/22/2011  . Syncopal episodes 09/20/2011  . GI bleed 09/19/2011  . HTN (hypertension) 09/19/2011  . Anemia due to blood loss, acute 09/19/2011  . Abnormality of gait 01/03/2011  . Personal history of fall  12/27/2010  . DISLOCATION CLOSED SHOULDER NEC 04/25/2010  . ANSERINE BURSITIS, LEFT 02/13/2010  . KNEE PAIN 12/22/2009  . LEG PAIN, BILATERAL 12/22/2009  . TOTAL KNEE FOLLOW-UP 12/22/2009  . HIP PAIN 02/07/2009  . LOW BACK PAIN 02/07/2009    PT - End of Session Activity Tolerance: Patient tolerated treatment well General Behavior During Therapy: WFL for tasks assessed/performed PT Plan of Care PT Home Exercise Plan: will progress next session, encouraged heat for soreness  Consulted and Agree with Plan of Care: Patient  GP Functional Assessment Tool Used: FOTO   other PT primary status 54%, limited 46%  Functional Limitation: Other PT primary Other PT Primary Current Status (Z6109): At least 40 percent  but less than 60 percent impaired, limited or restricted Other PT Primary Goal Status (U0454): At least 20 percent but less than 40 percent impaired, limited or restricted  Afsana Liera 08/18/2013, 11:47 AM  Physician Documentation Your signature is required to indicate approval of the treatment plan as stated above.  Please sign and either send electronically or make a copy of this report for your files and return this physician signed original.   Please mark one 1.__approve of plan  2. ___approve of plan with the following conditions.   ______________________________                                                          _____________________ Physician Signature                                                                                                             Date

## 2013-08-20 ENCOUNTER — Ambulatory Visit (HOSPITAL_COMMUNITY)
Admission: RE | Admit: 2013-08-20 | Discharge: 2013-08-20 | Disposition: A | Discharge status: Home / Self Care | Payer: Medicare Other | Source: Ambulatory Visit | Attending: Pulmonary Disease | Admitting: Pulmonary Disease

## 2013-08-20 NOTE — Progress Notes (Signed)
Physical Therapy Treatment Patient Details  Name: Cindy Schultz MRN: [id number] Date of Birth: 20-Aug-1923  Today's Date: 08/20/2013 Time: 1015-1100 PT Time Calculation (min): 45 min Heat 1015- 1030  1030 - 1100  Manual  Visit#: 2 of 12  Re-eval: 09/17/13 Assessment Diagnosis: neck pain  Next MD Visit: Dr. Luan Pulling  Prior Therapy: no   Authorization: [employer]   Authorization Time Period:    Authorization Visit#: 2 of 10   Subjective: Symptoms/Limitations Symptoms: right side of neck sore , denies pain, trouble turing head while driving  Pertinent History: 80 year old , B hearing aids, past dx of carpal tunnel syndrome  Special Tests: x rays in chart  Patient Stated Goals: decrease neck soreness   Precautions/Restrictions     Exercise/Treatments Cervical AROM supine rotations 10 x  Each direction sitting and supine        Modalities Modalities: Moist Heat Manual Therapy Manual Therapy: Myofascial release Myofascial Release: B upper trapezius STM , cervical PROM rotations and side bending, gentle light traction  pulls 10-20  sec x 4 ,   Moist Heat Therapy Number Minutes Moist Heat: 15 Minutes cervical in supine  Moist Heat Location: Other (comment)  Physical Therapy Assessment and Plan PT Assessment and Plan Clinical Impression Statement: improved AROM rotation R post treatment, tendenress persists with R belly of upper trap  PT Plan: trial of [place] R upper trap and or IFC, continue cervical AROM/PROM, gentle stretches     Goals PT Short Term Goals Time to Complete Short Term Goals: 2 weeks PT Short Term Goal 1: patient report 25% reduction in neck symptoms for decreased disturbance  with ADLS and continuation of normal routine to prevent loss of independence as patient lives alone  PT Short Term Goal 1 - Progress: Progressing toward goal PT Short Term Goal 2: Improve AROM cervical spine side bend left to 10 degrees from 5 degrees for progiression towards overall  impropved neck mobility required for comfort during sleeping and driving  PT Short Term Goal 2 - Progress: Progressing toward goal PT Long Term Goals Time to Complete Long Term Goals: 4 weeks PT Long Term Goal 1: report neck soreness as intermittent rather than constant  PT Long Term Goal 1 - Progress: Progressing toward goal PT Long Term Goal 2: Improve B cervical rotation to 40 degrees for improved motion in neck during driving, talking with person sitting next to patient, and social activities  PT Long Term Goal 2 - Progress: Progressing toward goal Long Term Goal 3: restore normal fasical pattern in R cervical musculature to improve ROM and decrease soreness patterns reported by patient  Long Term Goal 3 Progress: Progressing toward goal  Problem List Patient Active Problem List   Diagnosis Date Noted  . Osteoarthritis of left knee 04/23/2013  . Thumb pain 10/22/2012  . De Quervain's syndrome (tenosynovitis) 10/22/2012  . [facility] DJD(carpometacarpal degenerative joint disease), localized primary 10/22/2012  . CTS (carpal tunnel syndrome) 10/22/2012  . Trigger thumb of right hand 10/22/2012  . Pes anserinus bursitis of right knee 06/26/2012  . OA (osteoarthritis) of knee 06/26/2012  . Leg pain, left 06/26/2012  . Radicular pain of left lower extremity 06/26/2012  . Diverticulosis of colon with hemorrhage 09/22/2011  . Syncopal episodes 09/20/2011  . GI bleed 09/19/2011  . HTN (hypertension) 09/19/2011  . Anemia due to blood loss, acute 09/19/2011  . Abnormality of gait 01/03/2011  . Personal history of fall 12/27/2010  . DISLOCATION CLOSED SHOULDER NEC 04/25/2010  .  ANSERINE BURSITIS, LEFT 02/13/2010  . KNEE PAIN 12/22/2009  . LEG PAIN, BILATERAL 12/22/2009  . TOTAL KNEE FOLLOW-UP 12/22/2009  . HIP PAIN 02/07/2009  . LOW BACK PAIN 02/07/2009    PT - End of Session Activity Tolerance: Patient tolerated treatment well PT Plan of Care PT Patient Instructions: writtten HEP for  AROM cervical rotations sitting and lying down   GP    Keri Tavella 08/20/2013, 12:02 PM

## 2013-08-25 ENCOUNTER — Ambulatory Visit (HOSPITAL_COMMUNITY)
Admission: RE | Admit: 2013-08-25 | Discharge: 2013-08-25 | Disposition: A | Discharge status: Home / Self Care | Payer: Medicare Other | Source: Ambulatory Visit | Attending: Pulmonary Disease | Admitting: Pulmonary Disease

## 2013-08-25 NOTE — Progress Notes (Signed)
Physical Therapy Treatment Patient Details  Name: Cindy Schultz MRN: [id number] Date of Birth: 1923/12/17  Today's Date: 08/25/2013 Time: 1027-1100 PT Time Calculation (min): 33 min Charge [place] [id number], Manual 1035-1052, TE 1052-1100  Visit#: 3 of 12  Re-eval: 09/17/13 Assessment Diagnosis: neck pain  Next MD Visit: Dr. Luan Pulling  Prior Therapy: no   Authorization: [employer]   Authorization Time Period:    Authorization Visit#: 3 of 10   Subjective: Symptoms/Limitations Symptoms: Pt stated her neck pain increased overnight, believes its the weather or maybe the way she slept last nigh.  Ultrasound felt good last session.   Pain Assessment Currently in Pain?: Yes Pain Score: 10-Worst pain ever Pain Location: Neck Pain Orientation: Right  Objective  Exercise/Treatments Seated Exercises Neck Retraction: 5 reps;3 secs;Limitations Neck Retraction Limitations: therapist facilitation Cervical Rotation: Both;10 reps Lateral Flexion: Both;10 reps Postural Training: Educated on proper posture  Modalities Modalities: Ultrasound Manual Therapy Manual Therapy: Massage Massage: Bil Scalenes and Upper trapezius MFR to STM with cervical rotation and sidebending  Ultrasound Ultrasound Location: Bil cervical Ultrasound Parameters: 3 MHz @ 1.0 continuous 8' total= 4' each side Ultrasound Goals: Pain  Physical Therapy Assessment and Plan PT Assessment and Plan Clinical Impression Statement: Pt late for apt, unable to complete full POC today,  Session focus on pain reduction and improving cervical AROM.  Began session with continuous [place] for pain control and manual techniques complete to reduce multiple spasms Bil scalenes and upper trapezius region.  Pt educated on importance of proper posture and began gentle cervical ROM exercises.  Improved cervical rotation Bil wiht pain scale reduced to 4-5/10.  Pt encouraged to stay hydrated to reduce risk of headaches following manual.   PT  Plan: F/U with [place], continue manual to reduce fascial restricitons/spasms and cervical AROM exercises, begin stretches next session.      Goals PT Short Term Goals Time to Complete Short Term Goals: 2 weeks PT Short Term Goal 1: patient report 25% reduction in neck symptoms for decreased disturbance  with ADLS and continuation of normal routine to prevent loss of independence as patient lives alone  PT Short Term Goal 1 - Progress: Progressing toward goal PT Short Term Goal 2: Improve AROM cervical spine side bend left to 10 degrees from 5 degrees for progiression towards overall impropved neck mobility required for comfort during sleeping and driving  PT Short Term Goal 2 - Progress: Progressing toward goal PT Long Term Goals Time to Complete Long Term Goals: 4 weeks PT Long Term Goal 1: report neck soreness as intermittent rather than constant  PT Long Term Goal 1 - Progress: Progressing toward goal PT Long Term Goal 2: Improve B cervical rotation to 40 degrees for improved motion in neck during driving, talking with person sitting next to patient, and social activities  PT Long Term Goal 2 - Progress: Progressing toward goal Long Term Goal 3: restore normal fasical pattern in R cervical musculature to improve ROM and decrease soreness patterns reported by patient  Long Term Goal 3 Progress: Progressing toward goal  Problem List Patient Active Problem List   Diagnosis Date Noted  . Osteoarthritis of left knee 04/23/2013  . Thumb pain 10/22/2012  . De Quervain's syndrome (tenosynovitis) 10/22/2012  . [facility] DJD(carpometacarpal degenerative joint disease), localized primary 10/22/2012  . CTS (carpal tunnel syndrome) 10/22/2012  . Trigger thumb of right hand 10/22/2012  . Pes anserinus bursitis of right knee 06/26/2012  . OA (osteoarthritis) of knee 06/26/2012  . Leg pain,  left 06/26/2012  . Radicular pain of left lower extremity 06/26/2012  . Diverticulosis of colon with hemorrhage  09/22/2011  . Syncopal episodes 09/20/2011  . GI bleed 09/19/2011  . HTN (hypertension) 09/19/2011  . Anemia due to blood loss, acute 09/19/2011  . Abnormality of gait 01/03/2011  . Personal history of fall 12/27/2010  . DISLOCATION CLOSED SHOULDER NEC 04/25/2010  . ANSERINE BURSITIS, LEFT 02/13/2010  . KNEE PAIN 12/22/2009  . LEG PAIN, BILATERAL 12/22/2009  . TOTAL KNEE FOLLOW-UP 12/22/2009  . HIP PAIN 02/07/2009  . LOW BACK PAIN 02/07/2009    PT - End of Session Activity Tolerance: Patient tolerated treatment well General Behavior During Therapy: [facility] for tasks assessed/performed  GP    Aldona Lento 08/25/2013, 11:13 AM

## 2013-08-27 ENCOUNTER — Ambulatory Visit (HOSPITAL_COMMUNITY)
Admission: RE | Admit: 2013-08-27 | Discharge: 2013-08-27 | Disposition: A | Discharge status: Home / Self Care | Payer: Medicare Other | Source: Ambulatory Visit | Attending: Pulmonary Disease | Admitting: Pulmonary Disease

## 2013-08-27 NOTE — Progress Notes (Signed)
Physical Therapy Treatment Patient Details  Name: Cindy Schultz MRN: [id number] Date of Birth: 1923/09/20  Today's Date: 08/27/2013 Time: 1015-1100 PT Time Calculation (min): 45 min 1015 - 1020 heat  No charge  1020 - 1030 U/S  1030 -1045 manual  1045- 1100 TE  Visit#: 4 of 12  Re-eval: 09/17/13 Assessment Diagnosis: neck pain  Next MD Visit: Dr. Luan Pulling  Prior Therapy: no   Authorization: [employer]   Authorization Time Period:    Authorization Visit#: 4 of 10   Subjective: Symptoms/Limitations Symptoms: states her right hand has intermittent numbness byt reports less neck soreness , denies pain reports neck soreness  Pertinent History: 80 year old , B hearing aids, past dx of carpal tunnel syndrome   Precautions/Restrictions     Exercise/Treatments       Stretches Upper Trapezius Stretch: 2 reps;20 seconds Other Neck Stretches: open and close R hand 10x, thumb opposition to each finger 10x R hand     Seated Exercises Cervical Isometrics: Extension;5 secs;10 reps Neck Retraction: 5 reps;3 secs;Limitations Neck Retraction Limitations: therapist facilitation Cervical Rotation: Both;10 reps Lateral Flexion: Both;10 reps Postural Training: Educated on proper posture    Modalities Modalities: Moist Heat Manual Therapy Manual Therapy: Joint mobilization Joint Mobilization: upper middle trapezius soft tissue mobilization in sitting, SCM soft tissue mobilization x 15 min  Moist Heat Therapy Number Minutes Moist Heat: 5 Minutes, no charge at start of visit  Ultrasound Ultrasound Location: Cervical  Ultrasound Parameters: 3 mhz, 1/0 w/cm3, 8 min cont  B upper and middle trap for muscle flexibility and circulation   Physical Therapy Assessment and Plan PT Assessment and Plan Clinical Impression Statement: patient verbalizes decreased sorness and tightnesss in neck muscles, instructed today in upper trapezius stretches with ahnd out provided, AROM cervical lateral  flexion improved  PT Plan: F/U with [place], continue manual to reduce fascial restricitons/spasms and cervical AROM exercises, review stretches next session.      Goals PT Short Term Goals Time to Complete Short Term Goals: 2 weeks PT Short Term Goal 1: patient report 25% reduction in neck symptoms for decreased disturbance  with ADLS and continuation of normal routine to prevent loss of independence as patient lives alone  PT Short Term Goal 1 - Progress: Progressing toward goal PT Short Term Goal 2: Improve AROM cervical spine side bend left to 10 degrees from 5 degrees for progiression towards overall impropved neck mobility required for comfort during sleeping and driving  PT Short Term Goal 2 - Progress: Progressing toward goal PT Long Term Goals Time to Complete Long Term Goals: 4 weeks PT Long Term Goal 1: report neck soreness as intermittent rather than constant  PT Long Term Goal 2: Improve B cervical rotation to 40 degrees for improved motion in neck during driving, talking with person sitting next to patient, and social activities  PT Long Term Goal 2 - Progress: Progressing toward goal Long Term Goal 3: restore normal fasical pattern in R cervical musculature to improve ROM and decrease soreness patterns reported by patient  Long Term Goal 3 Progress: Progressing toward goal  Problem List Patient Active Problem List   Diagnosis Date Noted  . Osteoarthritis of left knee 04/23/2013  . Thumb pain 10/22/2012  . De Quervain's syndrome (tenosynovitis) 10/22/2012  . [facility] DJD(carpometacarpal degenerative joint disease), localized primary 10/22/2012  . CTS (carpal tunnel syndrome) 10/22/2012  . Trigger thumb of right hand 10/22/2012  . Pes anserinus bursitis of right knee 06/26/2012  . OA (osteoarthritis)  of knee 06/26/2012  . Leg pain, left 06/26/2012  . Radicular pain of left lower extremity 06/26/2012  . Diverticulosis of colon with hemorrhage 09/22/2011  . Syncopal episodes  09/20/2011  . GI bleed 09/19/2011  . HTN (hypertension) 09/19/2011  . Anemia due to blood loss, acute 09/19/2011  . Abnormality of gait 01/03/2011  . Personal history of fall 12/27/2010  . DISLOCATION CLOSED SHOULDER NEC 04/25/2010  . ANSERINE BURSITIS, LEFT 02/13/2010  . KNEE PAIN 12/22/2009  . LEG PAIN, BILATERAL 12/22/2009  . TOTAL KNEE FOLLOW-UP 12/22/2009  . HIP PAIN 02/07/2009  . LOW BACK PAIN 02/07/2009    PT - End of Session Activity Tolerance: Patient tolerated treatment well  GP    Cindy Schultz 08/27/2013, 1:17 PM

## 2013-09-01 ENCOUNTER — Ambulatory Visit (HOSPITAL_COMMUNITY)
Admission: RE | Admit: 2013-09-01 | Discharge: 2013-09-01 | Disposition: A | Discharge status: Home / Self Care | Payer: Medicare Other | Source: Ambulatory Visit | Attending: Pulmonary Disease | Admitting: Pulmonary Disease

## 2013-09-01 NOTE — Progress Notes (Signed)
Physical Therapy Treatment Patient Details  Name: Cindy Schultz MRN: [id number] Date of Birth: 02/18/1924  Today's Date: 09/01/2013 Time: 1015-1100 PT Time Calculation (min): 45 min 1015 -1030 TE  1030 -6834 heat   No charge  1962- 1044 ultrasound   1045 -1100 manual  Visit#: 5 of 12  Re-eval: 09/17/13 Assessment Diagnosis: neck pain  Next MD Visit: Dr. Luan Pulling  Prior Therapy: no   Authorization: [employer]   Authorization Time Period:    Authorization Visit#: 5 of 10   Subjective: Symptoms/Limitations Symptoms: notices hearing deficits , encouraged pt to speak with dr about hearing , neck sore  Pertinent History: 80 year old , B hearing aids, past dx of carpal tunnel syndrome   Precautions/Restrictions     Exercise/Treatments Mobility/Balance        Stretches Upper Trapezius Stretch: 2 reps;20 seconds Other Neck Stretches: open and close R hand 10x, thumb opposition to each finger 10x R hand  Machines for Strengthening   Theraband Exercises   Standing Exercises   Seated Exercises Cervical Isometrics: Extension;5 secs;10 reps Neck Retraction: 5 reps;3 secs;Limitations Neck Retraction Limitations: therapist facilitation Cervical Rotation: Both;10 reps Lateral Flexion: Both;10 reps Shoulder Shrugs: 10 reps Shoulder Rolls: 10 reps Supine Exercises   Sidelying Exercises   Prone Exercises   Hand Exercises for Cervical Radiculopathy    Manual Therapy Myofascial Release: b upper trap STM, rhomboind STM, trigger pint release to trapezius and rhomboids Moist Heat Therapy Number Minutes Moist Heat: 5 Minutes Ultrasound Ultrasound Location: cervical   Ultrasound Parameters: 3 mhz, 1.0 c/cm3, cont  B upper trapezius  8 min  Ultrasound Goals: Pain  Physical Therapy Assessment and Plan PT Assessment and Plan Clinical Impression Statement: decreased nodule to plpation in right upper trap mid belly, tactile cues for self upper trap stretch  Pt will benefit  from skilled therapeutic intervention in order to improve on the following deficits: Impaired flexibility;Decreased range of motion;Pain;Increased fascial restricitons Clinical Impairments Affecting Rehab Potential: multi level cervical Degenerative changes  PT Plan: F/U with [place], continue manual to reduce fascial restricitons/spasms and cervical AROM exercises, review stretches next session.  functionlal  UE exercises as tolerated     Goals PT Short Term Goals PT Short Term Goal 1: patient report 25% reduction in neck symptoms for decreased disturbance  with ADLS and continuation of normal routine to prevent loss of independence as patient lives alone  PT Short Term Goal 1 - Progress: Progressing toward goal PT Short Term Goal 2: Improve AROM cervical spine side bend left to 10 degrees from 5 degrees for progiression towards overall impropved neck mobility required for comfort during sleeping and driving  PT Short Term Goal 2 - Progress: Met PT Long Term Goals PT Long Term Goal 1: report neck soreness as intermittent rather than constant  PT Long Term Goal 1 - Progress: Progressing toward goal PT Long Term Goal 2: Improve B cervical rotation to 40 degrees for improved motion in neck during driving, talking with person sitting next to patient, and social activities  PT Long Term Goal 2 - Progress: Progressing toward goal Long Term Goal 3: restore normal fasical pattern in R cervical musculature to improve ROM and decrease soreness patterns reported by patient  Long Term Goal 3 Progress: Progressing toward goal  Problem List Patient Active Problem List   Diagnosis Date Noted  . Osteoarthritis of left knee 04/23/2013  . Thumb pain 10/22/2012  . De Quervain's syndrome (tenosynovitis) 10/22/2012  . [facility] DJD(carpometacarpal degenerative joint  disease), localized primary 10/22/2012  . CTS (carpal tunnel syndrome) 10/22/2012  . Trigger thumb of right hand 10/22/2012  . Pes anserinus bursitis of  right knee 06/26/2012  . OA (osteoarthritis) of knee 06/26/2012  . Leg pain, left 06/26/2012  . Radicular pain of left lower extremity 06/26/2012  . Diverticulosis of colon with hemorrhage 09/22/2011  . Syncopal episodes 09/20/2011  . GI bleed 09/19/2011  . HTN (hypertension) 09/19/2011  . Anemia due to blood loss, acute 09/19/2011  . Abnormality of gait 01/03/2011  . Personal history of fall 12/27/2010  . DISLOCATION CLOSED SHOULDER NEC 04/25/2010  . ANSERINE BURSITIS, LEFT 02/13/2010  . KNEE PAIN 12/22/2009  . LEG PAIN, BILATERAL 12/22/2009  . TOTAL KNEE FOLLOW-UP 12/22/2009  . HIP PAIN 02/07/2009  . LOW BACK PAIN 02/07/2009    PT Plan of Care Consulted and Agree with Plan of Care: Patient  GP    Marcos Peloso 09/01/2013, 11:46 AM

## 2013-09-03 ENCOUNTER — Ambulatory Visit (HOSPITAL_COMMUNITY)
Admission: RE | Admit: 2013-09-03 | Discharge: 2013-09-03 | Disposition: A | Discharge status: Home / Self Care | Payer: Medicare Other | Source: Ambulatory Visit | Attending: Pulmonary Disease | Admitting: Pulmonary Disease

## 2013-09-03 NOTE — Progress Notes (Signed)
Physical Therapy Treatment Patient Details  Name: Cindy Schultz MRN: [id number] Date of Birth: [birth year]  Today's Date: 09/03/2013 Time: 1020-1105 PT Time Calculation (min): 45 min Charge: TE 1020-1040, [place] 1042-1050, Manual 1050-1105  Visit#: 6 of 12  Re-eval: 09/17/13 Assessment Diagnosis: neck pain  Next MD Visit: Dr. Luan Pulling unscheduled Prior Therapy: no   Authorization: [employer]   Authorization Time Period:    Authorization Visit#: 6 of 10   Subjective: Symptoms/Limitations Symptoms: Pt reports hearing problems and head rotation with driving seem to most difficulty.  Current neck pain scale 4-5/10,  Pt reported pain reduced with [place] last session   Pain Assessment Currently in Pain?: Yes Pain Score: 5  Pain Location: Neck Pain Orientation: Right  Objective:   Exercise/Treatments Stretches Upper Trapezius Stretch: 3 reps;30 seconds Seated Exercises Neck Retraction: 10 reps;3 secs;Limitations Neck Retraction Limitations: therapist facilitation Cervical Rotation: Both;10 reps Lateral Flexion: Both;10 reps  Modalities Modalities: Ultrasound Manual Therapy Manual Therapy: Massage Massage: Bil Scalenes, Levator Scapula,  and Upper trapezius MFR to STM with cervical rotation and sidebending Ultrasound Ultrasound Location: Bil cervical Ultrasound Parameters: 3 mhz, 1.0 c/cm3, cont B upper trapezius 8 min Ultrasound Goals: Pain  Physical Therapy Assessment and Plan PT Assessment and Plan Clinical Impression Statement: Session focus on improving cervical AROM and reduction of pain with modalities and manual techniques.  Pt able to complete therex with therapist facilitation for proper form.  Continued ultrasound to reduce pain and soft tissue massage to cervical region to reduce multiple spasms especially scalene, levator scapula and upper trapezius region. PT Plan: F/U with [place], continue manual to reduce fascial restricitons/spasms and cervical AROM exercises, review  stretches next session.  functionlal  UE exercises as tolerated     Goals PT Short Term Goals PT Short Term Goal 1: patient report 25% reduction in neck symptoms for decreased disturbance  with ADLS and continuation of normal routine to prevent loss of independence as patient lives alone  PT Short Term Goal 1 - Progress: Progressing toward goal PT Short Term Goal 2: Improve AROM cervical spine side bend left to 10 degrees from 5 degrees for progiression towards overall impropved neck mobility required for comfort during sleeping and driving  PT Long Term Goals PT Long Term Goal 1: report neck soreness as intermittent rather than constant  PT Long Term Goal 1 - Progress: Progressing toward goal PT Long Term Goal 2: Improve B cervical rotation to 40 degrees for improved motion in neck during driving, talking with person sitting next to patient, and social activities  PT Long Term Goal 2 - Progress: Progressing toward goal Long Term Goal 3: restore normal fasical pattern in R cervical musculature to improve ROM and decrease soreness patterns reported by patient  Long Term Goal 3 Progress: Progressing toward goal  Problem List Patient Active Problem List   Diagnosis Date Noted  . Osteoarthritis of left knee 04/23/2013  . Thumb pain 10/22/2012  . De Quervain's syndrome (tenosynovitis) 10/22/2012  . [facility] DJD(carpometacarpal degenerative joint disease), localized primary 10/22/2012  . CTS (carpal tunnel syndrome) 10/22/2012  . Trigger thumb of right hand 10/22/2012  . Pes anserinus bursitis of right knee 06/26/2012  . OA (osteoarthritis) of knee 06/26/2012  . Leg pain, left 06/26/2012  . Radicular pain of left lower extremity 06/26/2012  . Diverticulosis of colon with hemorrhage 09/22/2011  . Syncopal episodes 09/20/2011  . GI bleed 09/19/2011  . HTN (hypertension) 09/19/2011  . Anemia due to blood loss, acute 09/19/2011  .  Abnormality of gait 01/03/2011  . Personal history of fall  12/27/2010  . DISLOCATION CLOSED SHOULDER NEC 04/25/2010  . ANSERINE BURSITIS, LEFT 02/13/2010  . KNEE PAIN 12/22/2009  . LEG PAIN, BILATERAL 12/22/2009  . TOTAL KNEE FOLLOW-UP 12/22/2009  . HIP PAIN 02/07/2009  . LOW BACK PAIN 02/07/2009    PT - End of Session Activity Tolerance: Patient tolerated treatment well General Behavior During Therapy: [facility] for tasks assessed/performed  GP    Aldona Lento 09/03/2013, 12:02 PM

## 2013-09-08 ENCOUNTER — Ambulatory Visit (HOSPITAL_COMMUNITY)
Admission: RE | Admit: 2013-09-08 | Discharge: 2013-09-08 | Disposition: A | Discharge status: Home / Self Care | Payer: Medicare Other | Source: Ambulatory Visit | Attending: Pulmonary Disease | Admitting: Pulmonary Disease

## 2013-09-08 DIAGNOSIS — N898 Other specified noninflammatory disorders of vagina: Secondary | ICD-10-CM | POA: Diagnosis not present

## 2013-09-08 NOTE — Progress Notes (Signed)
Physical Therapy Treatment Patient Details  Name: Cindy Schultz MRN: [id number] Date of Birth: [birth year]  Today's Date: 09/08/2013 Time: 1105-1150 PT Time Calculation (min): 45 min 1105-1115 heat 1115- 1120 manual 1120 -6 TE  Visit#: 7 of 12  Re-eval: 09/17/13 Assessment Diagnosis: neck pain  Next MD Visit: Dr. Luan Pulling unscheduled Prior Therapy: no   Authorization: [employer]   Authorization Time Period:    Authorization Visit#: 7 of 10   Subjective: Symptoms/Limitations Symptoms: reports hearing improves and decreases, wants to improve neck motion for driving, neck sore, right cervical pain 3/10  Pertinent History: 80 year old , B hearing aids, past dx of carpal tunnel syndrome     Exercise/Treatments     all there exercise tactile and verbal cued  Stretches Upper Trapezius Stretch: 2 reps;20 seconds;Limitations Upper Trapezius Stretch Limitations: therapist asisted in sitting  Levator Stretch: 2 reps;20 seconds;Limitations Levator Stretch Limitations: therapist assisted in sitting      Standing Exercises Other Standing Exercises: standing with back to wall and proper posture: isometric shoulder extension 10x, isometric scap retractions elbows bend 10x , active shoulder flexion 0-90 10x, aactive overhead reach 5 x each arm, active cross punches 5 x each, Other Standing Exercises: active  cervical AROM rotations with 1 HHA  ( using wall targets) 1 min, Active neck flexion and extension 1 HHA 1 min, Gaze stabilization 1 min ( eyes on target with active cervical rotations x 1 min  Seated Exercises Neck Retraction: 5 reps Cervical Rotation: Both;10 reps Lateral Flexion: Both;10 reps Shoulder Shrugs: 10 reps Shoulder Rolls: 10 reps Other Seated Exercise: seated arm flexion pulley x 1 min     Manual Therapy Manual Therapy: Myofascial release Myofascial Release: right Upper and middle trapezius x 5 min seated post heat  Moist Heat Therapy Number Minutes Moist Heat:  10 Minutes  Physical Therapy Assessment and Plan PT Assessment and Plan Clinical Impression Statement: patient transitioned to increased ther ex for ROM , d/c ulntrasound atthis time, will use other modalities prn, hand support uin standing from chair during standing ther ex, no radiuclar symptoms during exercises, decreased fascial restriction right cervical with improved cervical AROM  Clinical Impairments Affecting Rehab Potential: multi level cervical Degenerative changes  PT Treatment/Interventions: Therapeutic activities;Therapeutic exercise;Manual techniques;Patient/family education;Neuromuscular re-education;Modalities;Balance training PT Plan:  continue manual to reduce fascial restricitons/spasms and cervical AROM exercises, review stretches next session.  functionlal  UE exercises as tolerated , heat prn, e stim IFC prn     Goals Home Exercise Program Pt/caregiver will Perform Home Exercise Program: For increased ROM PT Goal: Perform Home Exercise Program - Progress: Progressing toward goal PT Short Term Goals Time to Complete Short Term Goals: 2 weeks PT Short Term Goal 1: patient report 25% reduction in neck symptoms for decreased disturbance  with ADLS and continuation of normal routine to prevent loss of independence as patient lives alone  PT Short Term Goal 1 - Progress: Progressing toward goal PT Short Term Goal 2: Improve AROM cervical spine side bend left to 10 degrees from 5 degrees for progiression towards overall impropved neck mobility required for comfort during sleeping and driving  PT Short Term Goal 2 - Progress: Met PT Long Term Goals Time to Complete Long Term Goals: 4 weeks PT Long Term Goal 1: report neck soreness as intermittent rather than constant  PT Long Term Goal 1 - Progress: Progressing toward goal PT Long Term Goal 2: Improve B cervical rotation to 40 degrees for improved motion in neck during  driving, talking with person sitting next to patient, and  social activities  PT Long Term Goal 2 - Progress: Met Long Term Goal 3: restore normal fasical pattern in R cervical musculature to improve ROM and decrease soreness patterns reported by patient  Long Term Goal 3 Progress: Progressing toward goal Long Term Goal 4: new goal set 09/08/13 cervical rotations AROM B 0-50 for driving  Long Term Goal 4 Progress: Progressing toward goal  Problem List Patient Active Problem List   Diagnosis Date Noted  . Osteoarthritis of left knee 04/23/2013  . Thumb pain 10/22/2012  . De Quervain's syndrome (tenosynovitis) 10/22/2012  . [facility] DJD(carpometacarpal degenerative joint disease), localized primary 10/22/2012  . CTS (carpal tunnel syndrome) 10/22/2012  . Trigger thumb of right hand 10/22/2012  . Pes anserinus bursitis of right knee 06/26/2012  . OA (osteoarthritis) of knee 06/26/2012  . Leg pain, left 06/26/2012  . Radicular pain of left lower extremity 06/26/2012  . Diverticulosis of colon with hemorrhage 09/22/2011  . Syncopal episodes 09/20/2011  . GI bleed 09/19/2011  . HTN (hypertension) 09/19/2011  . Anemia due to blood loss, acute 09/19/2011  . Abnormality of gait 01/03/2011  . Personal history of fall 12/27/2010  . DISLOCATION CLOSED SHOULDER NEC 04/25/2010  . ANSERINE BURSITIS, LEFT 02/13/2010  . KNEE PAIN 12/22/2009  . LEG PAIN, BILATERAL 12/22/2009  . TOTAL KNEE FOLLOW-UP 12/22/2009  . HIP PAIN 02/07/2009  . LOW BACK PAIN 02/07/2009    PT - End of Session Activity Tolerance: Patient tolerated treatment well General Behavior During Therapy: WFL for tasks assessed/performed PT Plan of Care PT Home Exercise Plan: will progress next session, encouraged heat for soreness  Consulted and Agree with Plan of Care: Patient  GP    Falesha Schommer 09/08/2013, 1:34 PM

## 2013-09-10 ENCOUNTER — Ambulatory Visit (HOSPITAL_COMMUNITY)
Admission: RE | Admit: 2013-09-10 | Discharge: 2013-09-10 | Disposition: A | Discharge status: Home / Self Care | Payer: Medicare Other | Source: Ambulatory Visit | Attending: Pulmonary Disease | Admitting: Pulmonary Disease

## 2013-09-10 NOTE — Progress Notes (Signed)
Physical Therapy Treatment Patient Details  Name: Cindy Schultz MRN: [id number] Date of Birth: 27-Dec-1923  Today's Date: 09/10/2013 Time: 1020-1100 PT Time Calculation (min): 40 min  Visit#: 8 of 12  Re-eval: 09/17/13 Authorization: Medicare   Authorization Visit#: 8 of 10  Charges:  TE 1020-1048 (28'), manual 1048-1100 (12')  Subjective: Symptoms/Limitations Symptoms: Pt states something is going on with her voice this morning.  States her voice is raspy today which is not normal.   Not having much pain today in her neck, 2/10. Pain Assessment Currently in Pain?: Yes Pain Score: 2  Pain Location: Neck Pain Orientation: Right   Exercise/Treatments Standing Exercises Neck Retraction: 10 reps;Limitations Neck Retraction Limitations: against wall into towel 10 X5" holds Other Standing Exercises: standing with back to wall and proper posture: isometric shoulder extension 10x, isometric scap retractions elbows bend 10x , active shoulder flexion 0-90 10x Other Standing Exercises: active  cervical AROM rotations with 1 HHA  ( using wall targets) 1 min, Active neck flexion and extension 1 HHA 1 min, Gaze stabilization 1 min ( eyes on target with active cervical rotations x 1 min  Seated Exercises Lateral Flexion: Both;10 reps Shoulder Shrugs: 10 reps Shoulder Rolls: 10 reps    Manual Therapy Manual Therapy: Myofascial release Myofascial Release: Right upper and middle trapezius and cervical mm in seated position  Physical Therapy Assessment and Plan PT Assessment:  Encouraged patient to make an appointment with her ENT if symptoms persist in voice.  Pt continues to have difficulty with cervical rotation, Lt>Rt.  Noted improvement with Lt rotation following manual techniques to cervical musculature on Rt.  Multiple spasms in mid trap resolved with massage. PT Plan:  Review stretches next session.  Progress functional  UE exercises as tolerated and manual to decrease spasms and  increase ROM.     Problem List Patient Active Problem List   Diagnosis Date Noted  . Osteoarthritis of left knee 04/23/2013  . Thumb pain 10/22/2012  . De Quervain's syndrome (tenosynovitis) 10/22/2012  . [facility] DJD(carpometacarpal degenerative joint disease), localized primary 10/22/2012  . CTS (carpal tunnel syndrome) 10/22/2012  . Trigger thumb of right hand 10/22/2012  . Pes anserinus bursitis of right knee 06/26/2012  . OA (osteoarthritis) of knee 06/26/2012  . Leg pain, left 06/26/2012  . Radicular pain of left lower extremity 06/26/2012  . Diverticulosis of colon with hemorrhage 09/22/2011  . Syncopal episodes 09/20/2011  . GI bleed 09/19/2011  . HTN (hypertension) 09/19/2011  . Anemia due to blood loss, acute 09/19/2011  . Abnormality of gait 01/03/2011  . Personal history of fall 12/27/2010  . DISLOCATION CLOSED SHOULDER NEC 04/25/2010  . ANSERINE BURSITIS, LEFT 02/13/2010  . KNEE PAIN 12/22/2009  . LEG PAIN, BILATERAL 12/22/2009  . TOTAL KNEE FOLLOW-UP 12/22/2009  . HIP PAIN 02/07/2009  . LOW BACK PAIN 02/07/2009    PT - End of Session Activity Tolerance: Patient tolerated treatment well General Behavior During Therapy: WFL for tasks assessed/performed      Teena Irani, PTA/CLT 09/10/2013, 11:05 AM

## 2013-09-15 ENCOUNTER — Other Ambulatory Visit (INDEPENDENT_AMBULATORY_CARE_PROVIDER_SITE_OTHER): Payer: Self-pay | Admitting: Otolaryngology

## 2013-09-15 ENCOUNTER — Ambulatory Visit (HOSPITAL_COMMUNITY)
Admission: RE | Admit: 2013-09-15 | Discharge: 2013-09-15 | Disposition: A | Discharge status: Home / Self Care | Payer: Medicare Other | Source: Ambulatory Visit | Attending: Pulmonary Disease | Admitting: Pulmonary Disease

## 2013-09-15 NOTE — Progress Notes (Signed)
Physical Therapy Treatment Patient Details  Name: Cindy Schultz MRN: [id number] Date of Birth: 1924/06/06  Today's Date: 09/15/2013 Time: 1102-1149 PT Time Calculation (min): 47 min  Visit#: 9 of 12  Re-eval: 09/17/13 Authorization: [employer]   Authorization Visit#: 9 of 10  Charges;  therex 1102-1120 (18'), manual 1122-1147 (25')  Subjective: Symptoms/Limitations Symptoms: Pt reports no lasting relief from her neck.  States her voice cointinues to be raspy in the morning and she can hardly hear.  Pt states she has an appointment with Dr. Benjamine Mola (ENT) tomorrow. Pain Assessment Currently in Pain?: Yes Pain Score: 2  Pain Location: Neck Pain Orientation: Right   Exercise/Treatments Stretches Upper Trapezius Stretch: 2 reps;20 seconds;Limitations Upper Trapezius Stretch Limitations: therapist asisted in sitting  Levator Stretch: 2 reps;20 seconds;Limitations Levator Stretch Limitations: therapist assisted in sitting  Machines for Strengthening UBE (Upper Arm Bike): 4' backward   Manual Therapy Manual Therapy: Other (comment) Other Manual Therapy: Supine gentle cervical traction, soft tissue and myofascial techniques to cervical Rt>Lt.   Physical Therapy Assessment and Plan PT Assessment and Plan Clinical Impression Statement: Focused session on pain control with manual techniques.  Attempted gentle cervical traction in supine with soft tissue and myofascial techniques.  Noted spasms into Rt upper and mid trap muscles.  No complaints or radicular symptoms during manual techniques.  Reviewed stretches with improved form.  Pt reported less discomfort at end of session with slightly improved cervical rotation. PT Plan: Re-evaluate next visit.     Problem List Patient Active Problem List   Diagnosis Date Noted  . Osteoarthritis of left knee 04/23/2013  . Thumb pain 10/22/2012  . De Quervain's syndrome (tenosynovitis) 10/22/2012  . [facility] DJD(carpometacarpal degenerative joint  disease), localized primary 10/22/2012  . CTS (carpal tunnel syndrome) 10/22/2012  . Trigger thumb of right hand 10/22/2012  . Pes anserinus bursitis of right knee 06/26/2012  . OA (osteoarthritis) of knee 06/26/2012  . Leg pain, left 06/26/2012  . Radicular pain of left lower extremity 06/26/2012  . Diverticulosis of colon with hemorrhage 09/22/2011  . Syncopal episodes 09/20/2011  . GI bleed 09/19/2011  . HTN (hypertension) 09/19/2011  . Anemia due to blood loss, acute 09/19/2011  . Abnormality of gait 01/03/2011  . Personal history of fall 12/27/2010  . DISLOCATION CLOSED SHOULDER NEC 04/25/2010  . ANSERINE BURSITIS, LEFT 02/13/2010  . KNEE PAIN 12/22/2009  . LEG PAIN, BILATERAL 12/22/2009  . TOTAL KNEE FOLLOW-UP 12/22/2009  . HIP PAIN 02/07/2009  . LOW BACK PAIN 02/07/2009    PT - End of Session Activity Tolerance: Patient tolerated treatment well General Behavior During Therapy: WFL for tasks assessed/performed   Teena Irani, PTA/CLT 09/15/2013, 12:05 PM

## 2013-09-16 DIAGNOSIS — H903 Sensorineural hearing loss, bilateral: Secondary | ICD-10-CM | POA: Diagnosis not present

## 2013-09-16 DIAGNOSIS — R070 Pain in throat: Secondary | ICD-10-CM | POA: Diagnosis not present

## 2013-09-16 DIAGNOSIS — R490 Dysphonia: Secondary | ICD-10-CM | POA: Diagnosis not present

## 2013-09-17 ENCOUNTER — Ambulatory Visit (HOSPITAL_COMMUNITY): Payer: Medicare Other | Admitting: Physical Therapy

## 2013-09-18 ENCOUNTER — Ambulatory Visit (HOSPITAL_COMMUNITY)
Admission: RE | Admit: 2013-09-18 | Discharge: 2013-09-18 | Disposition: A | Discharge status: Home / Self Care | Payer: Medicare Other | Source: Ambulatory Visit | Attending: Pulmonary Disease | Admitting: Pulmonary Disease

## 2013-09-18 DIAGNOSIS — M542 Cervicalgia: Secondary | ICD-10-CM | POA: Insufficient documentation

## 2013-09-18 DIAGNOSIS — I10 Essential (primary) hypertension: Secondary | ICD-10-CM | POA: Diagnosis not present

## 2013-09-18 DIAGNOSIS — IMO0001 Reserved for inherently not codable concepts without codable children: Secondary | ICD-10-CM | POA: Diagnosis not present

## 2013-09-18 DIAGNOSIS — M6281 Muscle weakness (generalized): Secondary | ICD-10-CM | POA: Diagnosis not present

## 2013-09-18 NOTE — Evaluation (Signed)
Physical Therapy Evaluation  Patient Details  Name: Cindy Schultz MRN: [id number] Date of Birth: Jan 24, 1924  Today's Date: 09/18/2013 Time: 1105-1150 PT Time Calculation (min): 45 min    1105- 1115 heat 1115 - 1145 manual  FOTO completed  g code completed           Visit#: 10 of 12  Re-eval: 10/18/13 Assessment Diagnosis: neck pain  Next MD Visit: Dr. Luan Pulling unscheduled Prior Therapy: no   Authorization: [employer]     Authorization Time Period:    Authorization Visit#: 10 of 12   Past Medical History:  Past Medical History  Diagnosis Date  . Macular degeneration   . Glaucoma   . Precancerous changes of the cervix     lesions  . Rectocele   . Diverticulitis   . Lumbar spine pain   . Pain in joints   . Hearing loss   . HTN (hypertension)     cholesterol  . Lymphedema   . DVT (deep venous thrombosis) 07/14/10    LEA doppler   . SOB (shortness of breath) 04/04/2010    2D echo EF=>55%   Past Surgical History:  Past Surgical History  Procedure Laterality Date  . Salk    . Tracheotomy    . Fallen uterus    . Ovarian cyst surgery    . Bladder tac    . Hemmorrhoidectomy    . Met osteostomy    . Left total knee replacement  05/02/04    [name]  . Morton's nueroma    . Elevated metatarsal arch    . Rupture rt groin    . Appendix    . Rectocele repair    . Corneal erosion      cataracts   . Colonoscopy  09/20/2011    Procedure: COLONOSCOPY;  Surgeon: Beryle Beams, MD;  Location: [facility] ENDOSCOPY;  Service: Endoscopy;  Laterality: N/A;    Subjective Symptoms/Limitations Symptoms: pateint reports neck is sore without as many knots in her muscles, voice is raspy but they found tumor in throat and she is waiting for xray to call her to schedule films, she had apppt with ENT yesterday Pertinent History: 80 year old , B hearing aids, past dx of carpal tunnel syndrome  Special Tests: x rays in chart  Patient Stated Goals: decrease neck soreness  Pain  Assessment Currently in Pain?: Yes Pain Score: 2  Pain Location: Neck Pain Orientation: Right;Left  Precautions/Restrictions     Balance Screening    Prior Functioning     Cognition/Observation    Sensation/Coordination/Flexibility/Functional Tests    Assessment Cervical AROM Cervical Flexion: 25 (was 20) Cervical Extension: 20 Cervical - Right Side Bend: 16 (was 5)  Cervical - Left Side Bend: 12 (was 10) Cervical - Right Rotation: 45  (was 36) Cervical - Left Rotation: 43 (was 35)   Exercise/Treatments Mobility/Balance       Manual Therapy Other Manual Therapy: supine gentle cervial tractions traction , soft tisdue and Myofascial techniques to cervical R > L , cervical PROM rotations and side bendings  Moist Heat Therapy Number Minutes Moist Heat: 10 Minutes Moist Heat Location: Other (comment)  Physical Therapy Assessment and Plan PT Assessment and Plan Clinical Impression Statement: patient has improved her cervical AROM in all planes, continues to have greater retrictions with AROM rotation L and side bend left. Cevical tendernessss in right upper trapezius and SCM, she has ongoing hearingand voice raspy, and dr has found throat tumor whihc will require further imaging.  pateint is eager to get imaging done on throat. PT will continue two more visits to work towards goals.  Therapy ( 10 visits)  was utilized ultrasound, manual  therapy, heat, HEP, therapeutic exercise for AROM and stretches, education  PT Plan: continue manual work for ROM and fascial restrictions, continue heat, functionlal cervical AROM and stretches     Goals PT Short Term Goals Time to Complete Short Term Goals: 2 weeks PT Short Term Goal 1: patient report 25% reduction in neck symptoms for decreased disturbance  with ADLS and continuation of normal routine to prevent loss of independence as patient lives alone  PT Short Term Goal 1 - Progress: Progressing toward goal PT Short Term Goal 2:  Improve AROM cervical spine side bend left to 10 degrees from 5 degrees for progiression towards overall impropved neck mobility required for comfort during sleeping and driving  PT Short Term Goal 2 - Progress: Met PT Long Term Goals PT Long Term Goal 1: report neck soreness as intermittent rather than constant  PT Long Term Goal 1 - Progress: Progressing toward goal PT Long Term Goal 2: Improve B cervical rotation to 40 degrees for improved motion in neck during driving, talking with person sitting next to patient, and social activities  PT Long Term Goal 2 - Progress: Met Long Term Goal 3: restore normal fasical pattern in R cervical musculature to improve ROM and decrease soreness patterns reported by patient  Long Term Goal 3 Progress: Progressing toward goal Long Term Goal 4: new goal set 09/08/13 cervical rotations AROM B 0-50 for driving  Long Term Goal 4 Progress: Progressing toward goal  Problem List Patient Active Problem List   Diagnosis Date Noted  . Osteoarthritis of left knee 04/23/2013  . Thumb pain 10/22/2012  . De Quervain's syndrome (tenosynovitis) 10/22/2012  . [facility] DJD(carpometacarpal degenerative joint disease), localized primary 10/22/2012  . CTS (carpal tunnel syndrome) 10/22/2012  . Trigger thumb of right hand 10/22/2012  . Pes anserinus bursitis of right knee 06/26/2012  . OA (osteoarthritis) of knee 06/26/2012  . Leg pain, left 06/26/2012  . Radicular pain of left lower extremity 06/26/2012  . Diverticulosis of colon with hemorrhage 09/22/2011  . Syncopal episodes 09/20/2011  . GI bleed 09/19/2011  . HTN (hypertension) 09/19/2011  . Anemia due to blood loss, acute 09/19/2011  . Abnormality of gait 01/03/2011  . Personal history of fall 12/27/2010  . DISLOCATION CLOSED SHOULDER NEC 04/25/2010  . ANSERINE BURSITIS, LEFT 02/13/2010  . KNEE PAIN 12/22/2009  . LEG PAIN, BILATERAL 12/22/2009  . TOTAL KNEE FOLLOW-UP 12/22/2009  . HIP PAIN 02/07/2009  . LOW BACK  PAIN 02/07/2009    PT - End of Session Activity Tolerance: Patient tolerated treatment well PT Plan of Care Consulted and Agree with Plan of Care: Patient  GP Functional Assessment Tool Used: foto score 64, (prior 5) other PT primary  Other PT Primary Current Status (Z7673): At least 20 percent but less than 40 percent impaired, limited or restricted Other PT Primary Goal Status (A1937): At least 20 percent but less than 40 percent impaired, limited or restricted  Klein Willcox 09/18/2013, 12:52 PM  Physician Documentation Your signature is required to indicate approval of the treatment plan as stated above.  Please sign and either send electronically or make a copy of this report for your files and return this physician signed original.   Please mark one 1.__approve of plan  2. ___approve of plan with the following conditions.   ______________________________  _____________________ Physician Signature                                                                                                             Date

## 2013-09-21 ENCOUNTER — Other Ambulatory Visit (INDEPENDENT_AMBULATORY_CARE_PROVIDER_SITE_OTHER): Payer: Self-pay | Admitting: Otolaryngology

## 2013-09-21 DIAGNOSIS — R221 Localized swelling, mass and lump, neck: Secondary | ICD-10-CM

## 2013-09-22 ENCOUNTER — Ambulatory Visit (HOSPITAL_COMMUNITY)
Admission: RE | Admit: 2013-09-22 | Discharge: 2013-09-22 | Disposition: A | Discharge status: Home / Self Care | Payer: Medicare Other | Source: Ambulatory Visit | Attending: Otolaryngology | Admitting: Otolaryngology

## 2013-09-22 ENCOUNTER — Ambulatory Visit (HOSPITAL_COMMUNITY)
Admission: RE | Admit: 2013-09-22 | Discharge: 2013-09-22 | Disposition: A | Discharge status: Home / Self Care | Payer: Medicare Other | Source: Ambulatory Visit | Attending: Pulmonary Disease | Admitting: Pulmonary Disease

## 2013-09-22 ENCOUNTER — Other Ambulatory Visit (INDEPENDENT_AMBULATORY_CARE_PROVIDER_SITE_OTHER): Payer: Self-pay | Admitting: Otolaryngology

## 2013-09-22 ENCOUNTER — Encounter (HOSPITAL_COMMUNITY): Payer: Self-pay

## 2013-09-22 DIAGNOSIS — M542 Cervicalgia: Secondary | ICD-10-CM | POA: Diagnosis not present

## 2013-09-22 DIAGNOSIS — Z9889 Other specified postprocedural states: Secondary | ICD-10-CM | POA: Insufficient documentation

## 2013-09-22 DIAGNOSIS — I10 Essential (primary) hypertension: Secondary | ICD-10-CM | POA: Diagnosis not present

## 2013-09-22 DIAGNOSIS — M6281 Muscle weakness (generalized): Secondary | ICD-10-CM | POA: Diagnosis not present

## 2013-09-22 DIAGNOSIS — I6529 Occlusion and stenosis of unspecified carotid artery: Secondary | ICD-10-CM | POA: Diagnosis not present

## 2013-09-22 DIAGNOSIS — IMO0001 Reserved for inherently not codable concepts without codable children: Secondary | ICD-10-CM | POA: Diagnosis not present

## 2013-09-22 DIAGNOSIS — J320 Chronic maxillary sinusitis: Secondary | ICD-10-CM | POA: Insufficient documentation

## 2013-09-22 DIAGNOSIS — R220 Localized swelling, mass and lump, head: Secondary | ICD-10-CM | POA: Diagnosis not present

## 2013-09-22 DIAGNOSIS — R221 Localized swelling, mass and lump, neck: Secondary | ICD-10-CM

## 2013-09-22 LAB — POCT I-STAT CREATININE: CREATININE: 1.9 mg/dL — AB (ref 0.50–1.10)

## 2013-09-22 MED ORDER — IOHEXOL 300 MG/ML  SOLN: 75.0000 mL | Freq: Once | INTRAMUSCULAR | Status: AC | PRN

## 2013-09-22 NOTE — Progress Notes (Signed)
Physical Therapy Treatment Patient Details  Name: Cindy Schultz MRN: [id number] Date of Birth: [birth year]  Today's Date: 09/22/2013 Time: 1104-1150 PT Time Calculation (min): 46 min  Visit#: 11 of 12  Re-eval: 10/18/13 Authorization: [employer]   Authorization Visit#: 11 of 12  Charges:  therex 1104-1130 (26'), manual and MHP 1132-1150 (18')  Subjective: Symptoms/Limitations Symptoms: Pt states Dr. Benjamine Mola found a lump in her neck at appointment last visit.  Pt to get CT scan done this afternoon.  Pt reports she currently has soreness and still with grinding/popping in cervical region with rotations.  Pain level remains the same. Pain Assessment Currently in Pain?: Yes Pain Score: 2    Exercise/Treatments Standing Exercises Neck Retraction: 10 reps;Limitations Neck Retraction Limitations: against wall into towel 10 X5" holds Other Standing Exercises: standing with back to wall and proper posture: isometric shoulder extension 10x, isometric scap retractions elbows bend 10x , active shoulder flexion 0-90 10x Other Standing Exercises: active  cervical AROM rotations with 1 HHA  ( using wall targets) 1 min, Active neck flexion and extension 1 HHA 1 min, Gaze stabilization 1 min ( eyes on target with active cervical rotations x 1 min    Modalities Modalities: Moist Heat Manual Therapy Manual Therapy: Other (comment) Other Manual Therapy: supine gentle manual cervical traction, soft tissue and myofascial techniques to cervical region, Rt>Lt, cervical PROM rotation and side bending to Lt. Moist Heat Therapy Number Minutes Moist Heat: 20 Minutes Moist Heat Location: Other (comment)  Physical Therapy Assessment and Plan PT Assessment and Plan Clinical Impression Statement: Continues to improve with cervical AROM.  slight pain but more tightness voiced with PROM of Lt rotations/SB in supine position.  Pt reports relief after manual techniques with less tightness/soreness.  Continues to  have tenderness and spasms into Rt upper trap and SCM muscles.  Pt goes for CT scan this afternoon.   PT Plan: One visit remaining per PT reassessment X 2 visits ago.  Inquire of CT results of neck next visit.  Reevaluate for anticipated discharge next visit.     Problem List Patient Active Problem List   Diagnosis Date Noted  . Osteoarthritis of left knee 04/23/2013  . Thumb pain 10/22/2012  . De Quervain's syndrome (tenosynovitis) 10/22/2012  . [facility] DJD(carpometacarpal degenerative joint disease), localized primary 10/22/2012  . CTS (carpal tunnel syndrome) 10/22/2012  . Trigger thumb of right hand 10/22/2012  . Pes anserinus bursitis of right knee 06/26/2012  . OA (osteoarthritis) of knee 06/26/2012  . Leg pain, left 06/26/2012  . Radicular pain of left lower extremity 06/26/2012  . Diverticulosis of colon with hemorrhage 09/22/2011  . Syncopal episodes 09/20/2011  . GI bleed 09/19/2011  . HTN (hypertension) 09/19/2011  . Anemia due to blood loss, acute 09/19/2011  . Abnormality of gait 01/03/2011  . Personal history of fall 12/27/2010  . DISLOCATION CLOSED SHOULDER NEC 04/25/2010  . ANSERINE BURSITIS, LEFT 02/13/2010  . KNEE PAIN 12/22/2009  . LEG PAIN, BILATERAL 12/22/2009  . TOTAL KNEE FOLLOW-UP 12/22/2009  . HIP PAIN 02/07/2009  . LOW BACK PAIN 02/07/2009    PT - End of Session Activity Tolerance: Patient tolerated treatment well PT Plan of Care Consulted and Agree with Plan of Care: Patient   Teena Irani, PTA/CLT 09/22/2013, 11:47 AM

## 2013-09-24 ENCOUNTER — Ambulatory Visit (HOSPITAL_COMMUNITY)
Admission: RE | Admit: 2013-09-24 | Discharge: 2013-09-24 | Disposition: A | Discharge status: Home / Self Care | Payer: Medicare Other | Source: Ambulatory Visit | Attending: Pulmonary Disease | Admitting: Pulmonary Disease

## 2013-09-24 DIAGNOSIS — M6281 Muscle weakness (generalized): Secondary | ICD-10-CM | POA: Diagnosis not present

## 2013-09-24 DIAGNOSIS — M542 Cervicalgia: Secondary | ICD-10-CM | POA: Diagnosis not present

## 2013-09-24 DIAGNOSIS — IMO0001 Reserved for inherently not codable concepts without codable children: Secondary | ICD-10-CM | POA: Diagnosis not present

## 2013-09-24 DIAGNOSIS — I10 Essential (primary) hypertension: Secondary | ICD-10-CM | POA: Diagnosis not present

## 2013-09-24 NOTE — Evaluation (Signed)
Physical Therapy Evaluation  Patient Details  Name: Cindy Schultz MRN: [id number] Date of Birth: August 04, 1923  Today's Date: 09/24/2013 Time: 1300-1350 PT Time Calculation (min): 50 min  1300 - 1315 heat  1315- 1330 manual  1330- 33 TE    Educated on button hook fastener           Visit#: 12 of 12  Re-eval:   Assessment Diagnosis: neck pain  Next MD Visit: Dr. Luan Pulling unscheduled Prior Therapy: no   Authorization: [employer]     Authorization Time Period:    Authorization Visit#:   of     Past Medical History:  Past Medical History  Diagnosis Date  . Macular degeneration   . Glaucoma   . Precancerous changes of the cervix     lesions  . Rectocele   . Diverticulitis   . Lumbar spine pain   . Pain in joints   . Hearing loss   . HTN (hypertension)     cholesterol  . Lymphedema   . DVT (deep venous thrombosis) 07/14/10    LEA doppler   . SOB (shortness of breath) 04/04/2010    2D echo EF=>55%   Past Surgical History:  Past Surgical History  Procedure Laterality Date  . Salk    . Tracheotomy    . Fallen uterus    . Ovarian cyst surgery    . Bladder tac    . Hemmorrhoidectomy    . Met osteostomy    . Left total knee replacement  05/02/04    [name]  . Morton's nueroma    . Elevated metatarsal arch    . Rupture rt groin    . Appendix    . Rectocele repair    . Corneal erosion      cataracts   . Colonoscopy  09/20/2011    Procedure: COLONOSCOPY;  Surgeon: Beryle Beams, MD;  Location: [facility] ENDOSCOPY;  Service: Endoscopy;  Laterality: N/A;    Subjective Symptoms/Limitations Symptoms: dr is referring patinet ot [facility] per ongoing voice horaseness and carotid artery blockage per recent imaging, neck feels tight when turning head to the left, some improvement with physical therapy but it has not helped voice or hearing per patient  Pertinent History: 80 year old , B hearing aids, past dx of carpal tunnel syndrome  Special Tests: x rays in chart   Patient Stated Goals: decrease neck soreness  Pain Assessment Currently in Pain?: Yes Pain Score: 2  Pain Location: Neck Pain Orientation: Right Effect of Pain on Daily Activities: troube turning head to left while driving   Precautions/Restrictions     Balance Screening Balance Screen Has the patient fallen in the past 6 months: No   Assessment Cervical AROM Cervical Flexion: 25 was 15  Cervical Extension: 25 was 20  Cervical - Right Side Bend: 16  Was 5  Cervical - Left Side Bend: 14 was 10  Cervical - Right Rotation: 50  Was 36  Cervical - Left Rotation: 45  Was 35   Right cervical pain   Exercise/Treatments   Standing Exercises Neck Retraction: 10 reps;Limitations Neck Retraction Limitations: against wall into towel 10 X5" holds Other Standing Exercises: standing with back to wall and proper posture: isometric shoulder extension 10x, isometric scap retractions elbows bend 10x , active shoulder flexion 0-90 10x Other Standing Exercises: active  cervical AROM rotations with 1 HHA  ( using wall targets) 1 min, Active neck flexion and extension 1 HHA 1 min, Gaze stabilization 1 min (  eyes on target with active cervical rotations x 1 min  Seated Exercises Shoulder Shrugs: 10 reps Shoulder Rolls: 10 reps Modalities Modalities: Moist Heat Manual Therapy Myofascial Release: B MFR upper and middle trapezius, SCM  Moist Heat Therapy Number Minutes Moist Heat: 15 Minutes  Physical Therapy Assessment and Plan PT Assessment and Plan Clinical Impression Statement: continues to report limited AROM rotation left when driving but has improved range, will folllow up with specialist at [facility] per throat CT scan imaging. Patient has reduced fasical restriction in her right cervical and scapular musculature .  PT Plan: discharge and continued HEP, consultation with [facility] per throat  CT scan     Goals PT Short Term Goals PT Short Term Goal 1: patient report 25%  reduction in neck symptoms for decreased disturbance  with ADLS and continuation of normal routine to prevent loss of independence as patient lives alone  PT Short Term Goal 1 - Progress: Met PT Short Term Goal 2: Improve AROM cervical spine side bend left to 10 degrees from 5 degrees for progiression towards overall impropved neck mobility required for comfort during sleeping and driving  PT Short Term Goal 2 - Progress: Met PT Long Term Goals Time to Complete Long Term Goals: 4 weeks PT Long Term Goal 1: report neck soreness as intermittent rather than constant  PT Long Term Goal 1 - Progress: Met PT Long Term Goal 2: Improve B cervical rotation to 40 degrees for improved motion in neck during driving, talking with person sitting next to patient, and social activities  PT Long Term Goal 2 - Progress: Met Long Term Goal 3: restore normal fasical pattern in R cervical musculature to improve ROM and decrease soreness patterns reported by patient  Long Term Goal 3 Progress: Met Long Term Goal 4 Progress: Partly met  Problem List Patient Active Problem List   Diagnosis Date Noted  . Osteoarthritis of left knee 04/23/2013  . Thumb pain 10/22/2012  . De Quervain's syndrome (tenosynovitis) 10/22/2012  . [facility] DJD(carpometacarpal degenerative joint disease), localized primary 10/22/2012  . CTS (carpal tunnel syndrome) 10/22/2012  . Trigger thumb of right hand 10/22/2012  . Pes anserinus bursitis of right knee 06/26/2012  . OA (osteoarthritis) of knee 06/26/2012  . Leg pain, left 06/26/2012  . Radicular pain of left lower extremity 06/26/2012  . Diverticulosis of colon with hemorrhage 09/22/2011  . Syncopal episodes 09/20/2011  . GI bleed 09/19/2011  . HTN (hypertension) 09/19/2011  . Anemia due to blood loss, acute 09/19/2011  . Abnormality of gait 01/03/2011  . Personal history of fall 12/27/2010  . DISLOCATION CLOSED SHOULDER NEC 04/25/2010  . ANSERINE BURSITIS, LEFT 02/13/2010  . KNEE  PAIN 12/22/2009  . LEG PAIN, BILATERAL 12/22/2009  . TOTAL KNEE FOLLOW-UP 12/22/2009  . HIP PAIN 02/07/2009  . LOW BACK PAIN 02/07/2009    PT - End of Session Activity Tolerance: Patient tolerated treatment well PT Plan of Care Consulted and Agree with Plan of Care: Patient  GP Functional Assessment Tool Used: foto score 64, (prior 54 ) other PT primary  Functional Limitation: Other PT primary Other PT Primary Goal Status (T0626): At least 20 percent but less than 40 percent impaired, limited or restricted Other PT Primary Discharge Status [phone]): At least 20 percent but less than 40 percent impaired, limited or restricted  Sarajane Marek 09/24/2013, 3:11 PM  Physician Documentation Your signature is required to indicate approval of the treatment plan as stated above.  Please sign and  either send electronically or make a copy of this report for your files and return this physician signed original.   Please mark one 1.__approve of plan  2. ___approve of plan with the following conditions.   ______________________________                                                          _____________________ Physician Signature                                                                                                             Date

## 2013-10-07 DIAGNOSIS — M1990 Unspecified osteoarthritis, unspecified site: Secondary | ICD-10-CM | POA: Diagnosis not present

## 2013-10-07 DIAGNOSIS — I10 Essential (primary) hypertension: Secondary | ICD-10-CM | POA: Diagnosis not present

## 2013-10-07 DIAGNOSIS — F329 Major depressive disorder, single episode, unspecified: Secondary | ICD-10-CM | POA: Diagnosis not present

## 2013-10-07 DIAGNOSIS — F3289 Other specified depressive episodes: Secondary | ICD-10-CM | POA: Diagnosis not present

## 2013-10-08 DIAGNOSIS — IMO0001 Reserved for inherently not codable concepts without codable children: Secondary | ICD-10-CM | POA: Diagnosis not present

## 2013-10-08 DIAGNOSIS — R490 Dysphonia: Secondary | ICD-10-CM | POA: Diagnosis not present

## 2013-10-27 DIAGNOSIS — R498 Other voice and resonance disorders: Secondary | ICD-10-CM | POA: Diagnosis not present

## 2013-10-27 DIAGNOSIS — M1990 Unspecified osteoarthritis, unspecified site: Secondary | ICD-10-CM | POA: Diagnosis not present

## 2013-10-27 DIAGNOSIS — I10 Essential (primary) hypertension: Secondary | ICD-10-CM | POA: Diagnosis not present

## 2013-11-05 DIAGNOSIS — H04129 Dry eye syndrome of unspecified lacrimal gland: Secondary | ICD-10-CM | POA: Diagnosis not present

## 2013-11-05 DIAGNOSIS — H353190 Nonexudative age-related macular degeneration, unspecified eye, stage unspecified: Secondary | ICD-10-CM | POA: Diagnosis not present

## 2013-11-05 DIAGNOSIS — H47239 Glaucomatous optic atrophy, unspecified eye: Secondary | ICD-10-CM | POA: Diagnosis not present

## 2013-11-12 ENCOUNTER — Ambulatory Visit (INDEPENDENT_AMBULATORY_CARE_PROVIDER_SITE_OTHER): Payer: Medicare Other | Admitting: Otolaryngology

## 2013-11-12 DIAGNOSIS — R490 Dysphonia: Secondary | ICD-10-CM

## 2013-11-23 DIAGNOSIS — H903 Sensorineural hearing loss, bilateral: Secondary | ICD-10-CM | POA: Diagnosis not present

## 2013-11-27 ENCOUNTER — Other Ambulatory Visit: Payer: Self-pay | Admitting: *Deleted

## 2013-11-27 DIAGNOSIS — I6529 Occlusion and stenosis of unspecified carotid artery: Secondary | ICD-10-CM

## 2013-12-11 ENCOUNTER — Encounter: Payer: Self-pay | Admitting: Surgery

## 2013-12-11 DIAGNOSIS — R29898 Other symptoms and signs involving the musculoskeletal system: Secondary | ICD-10-CM | POA: Diagnosis not present

## 2013-12-14 ENCOUNTER — Ambulatory Visit (INDEPENDENT_AMBULATORY_CARE_PROVIDER_SITE_OTHER): Payer: Medicare Other | Admitting: Surgery

## 2013-12-14 ENCOUNTER — Encounter: Payer: Self-pay | Admitting: Surgery

## 2013-12-14 ENCOUNTER — Ambulatory Visit (HOSPITAL_COMMUNITY)
Admission: RE | Admit: 2013-12-14 | Discharge: 2013-12-14 | Disposition: A | Discharge status: Home / Self Care | Payer: Medicare Other | Source: Ambulatory Visit | Attending: Surgery | Admitting: Surgery

## 2013-12-14 VITALS — BP 128/56 | HR 64 | Temp 97.6°F | Resp 16 | Ht 62.0 in | Wt 130.0 lb

## 2013-12-14 DIAGNOSIS — I6529 Occlusion and stenosis of unspecified carotid artery: Secondary | ICD-10-CM

## 2013-12-14 NOTE — Progress Notes (Signed)
Patient name: Cindy Schultz MRN: [id number] DOB: [birth year] Sex: female   Referred by: Dr. Benjamine Mola  Reason for referral:  Chief Complaint  Patient presents with  . Carotid    patient c/o hoarseness. Has been seen at [facility] speech therapy.     HISTORY OF PRESENT ILLNESS: A very pleasant 80 year old female who was referred today for carotid plaque.  The patient has a history of hoarseness as well as a sinus disease.  She recently underwent a CT scan which revealed a severely calcified plaque at the left carotid bifurcation.  The patient denies symptoms of a stroke or TIA.  Specifically she denies numbness or weakness in either extremity.  She denies slurred speech.  She denies amaurosis fugax.  Patient has a history of DVT which was treated with 6 months of Coumadin.  She is medically managed for hypercholesterolemia and hypertension.  She is a nonsmoker.  Past Medical History  Diagnosis Date  . Macular degeneration   . Glaucoma   . Precancerous changes of the cervix     lesions  . Rectocele   . Diverticulitis   . Lumbar spine pain   . Pain in joints   . Hearing loss   . HTN (hypertension)     cholesterol  . Lymphedema   . DVT (deep venous thrombosis) 07/14/10    LEA doppler   . SOB (shortness of breath) 04/04/2010    2D echo EF=>55%    Past Surgical History  Procedure Laterality Date  . Salk    . Tracheotomy    . Fallen uterus    . Ovarian cyst surgery    . Bladder tac    . Hemmorrhoidectomy    . Met osteostomy    . Left total knee replacement  05/02/04    [name]  . Morton's nueroma    . Elevated metatarsal arch    . Rupture rt groin    . Appendix    . Rectocele repair    . Corneal erosion      cataracts   . Colonoscopy  09/20/2011    Procedure: COLONOSCOPY;  Surgeon: Beryle Beams, MD;  Location: [facility] ENDOSCOPY;  Service: Endoscopy;  Laterality: N/A;    History   Social History  . Marital Status: Divorced    Spouse Name: N/A    Number of  Children: N/A  . Years of Education: N/A   Occupational History  . Not on file.   Social History Main Topics  . Smoking status: Never Smoker   . Smokeless tobacco: Not on file  . Alcohol Use: No  . Drug Use: No  . Sexual Activity: Not on file   Other Topics Concern  . Not on file   Social History Narrative  . No narrative on file    Family History  Problem Relation Age of Onset  . Stroke Mother   . Diabetes Mother   . Heart attack Father   . COPD Paternal Grandfather   . Dementia Sister     Allergies as of 12/14/2013 - Review Complete 12/14/2013  Allergen Reaction Noted  . Codeine Nausea And Vomiting   . Hydrocodone Other (See Comments)   . Promethazine hcl    . Tramadol hcl Other (See Comments)     Current Outpatient Prescriptions on File Prior to Visit  Medication Sig Dispense Refill  . acetaminophen (TYLENOL) 500 MG tablet Take 500 mg by mouth every 6 (six) hours as needed.      Marland Kitchen  Calcium 600-200 MG-UNIT per tablet Take 1 tablet by mouth daily.        . colesevelam (WELCHOL) 625 MG tablet Take 1,875 mg by mouth 2 (two) times daily with a meal.        . dorzolamide-timolol (COSOPT) 22.3-6.8 MG/ML ophthalmic solution Place 1 drop into both eyes 2 (two) times daily.        . ferrous sulfate 325 (65 FE) MG tablet Take 325 mg by mouth daily with breakfast.      . fluticasone (FLONASE) 50 MCG/ACT nasal spray Place 2 sprays into the nose daily.        Marland Kitchen gabapentin (NEURONTIN) 100 MG capsule Take 1 capsule (100 mg total) by mouth at bedtime.  30 capsule  5  . glucosamine-chondroitin 500-400 MG tablet Take 1 tablet by mouth 3 (three) times daily.      Marland Kitchen latanoprost (XALATAN) 0.005 % ophthalmic solution Place 1 drop into the right eye at bedtime.       Marland Kitchen lisinopril (PRINIVIL,ZESTRIL) 20 MG tablet Take 1 tablet by mouth daily.      . Multiple Vitamins-Minerals (OCUVITE PO) Take 1 tablet by mouth daily.      . mupirocin ointment (BACTROBAN) 2 % Apply 1 application topically 2  (two) times daily as needed.      Marland Kitchen NIFEdipine (PROCARDIA-XL/ADALAT CC) 30 MG 24 hr tablet Take 30 mg by mouth daily.       Marland Kitchen oxybutynin (DITROPAN-XL) 5 MG 24 hr tablet Take 5 mg by mouth daily.      Vladimir Faster Glycol-Propyl Glycol (SYSTANE ULTRA OP) Apply 1 drop to eye 3 (three) times daily.        No current facility-administered medications on file prior to visit.     REVIEW OF SYSTEMS: Cardiovascular: No chest pain, chest pressure, palpitations, positive shortness of breath with exertion and DVT Pulmonary: Positive for productive cough, no asthma or wheezing. Neurologic: No weakness, paresthesias, aphasia, or amaurosis. No dizziness. Hematologic: No bleeding problems or clotting disorders. Musculoskeletal: No joint pain or joint swelling. Gastrointestinal: No blood in stool or hematemesis Genitourinary: No dysuria or hematuria. Psychiatric:: No history of major depression. Integumentary: No rashes or ulcers. Constitutional: No fever or chills.  PHYSICAL EXAMINATION: General: The patient appears their stated age.  Vital signs are BP 128/56  Pulse 64  Temp(Src) 97.6 F (36.4 C) (Oral)  Resp 16  Ht _0  (1.575 m)  Wt 130 lb (58.968 kg)  BMI 23.77 kg/m2  SpO2 100% HEENT:  No gross abnormalities Pulmonary: Respirations are non-labored Musculoskeletal: There are no major deformities.   Neurologic: No focal weakness or paresthesias are detected, Skin: There are no ulcer or rashes noted. Psychiatric: The patient has normal affect. Cardiovascular: There is a regular rate and rhythm without significant murmur appreciated.  No carotid bruits  Diagnostic Studies: CT scan shows calcified plaque at the carotid bifurcation.  I have ordered and reviewed her carotid duplex.  This shows bilateral internal carotid lost is less than 40%.  Left internal carotid velocities may be under MAC estimated given calcific plaque.  Assessment:  Asymptomatic carotid stenosis Plan: Based on the  ultrasound today I do not think there is a hemodynamically significant stenosis at the left common carotid artery where the plaque was identified.  I do think she is to be followed on an annual basis to make sure that these velocity profiles remained consistent.  Certainly should she develop symptoms, she may require a more invasive test such as  a CT angiogram or for catheter-based angiography.  I do not think either is required at this time as she remains asymptomatic and I feel that even with the plaque, her velocities are done to be less than 80% which would be the threshold for intervention.     Eldridge Abrahams, M.D. [facility] Office: [phone] Pager:  [phone]

## 2013-12-23 ENCOUNTER — Ambulatory Visit (INDEPENDENT_AMBULATORY_CARE_PROVIDER_SITE_OTHER): Payer: Medicare Other | Admitting: Ophthalmology

## 2013-12-23 DIAGNOSIS — H35379 Puckering of macula, unspecified eye: Secondary | ICD-10-CM | POA: Diagnosis not present

## 2013-12-23 DIAGNOSIS — I10 Essential (primary) hypertension: Secondary | ICD-10-CM

## 2013-12-23 DIAGNOSIS — H43819 Vitreous degeneration, unspecified eye: Secondary | ICD-10-CM

## 2013-12-23 DIAGNOSIS — H35039 Hypertensive retinopathy, unspecified eye: Secondary | ICD-10-CM | POA: Diagnosis not present

## 2013-12-23 DIAGNOSIS — H3530 Unspecified macular degeneration: Secondary | ICD-10-CM | POA: Diagnosis not present

## 2013-12-23 DIAGNOSIS — H348392 Tributary (branch) retinal vein occlusion, unspecified eye, stable: Secondary | ICD-10-CM

## 2013-12-29 DIAGNOSIS — M1990 Unspecified osteoarthritis, unspecified site: Secondary | ICD-10-CM | POA: Diagnosis not present

## 2013-12-29 DIAGNOSIS — E785 Hyperlipidemia, unspecified: Secondary | ICD-10-CM | POA: Diagnosis not present

## 2013-12-29 DIAGNOSIS — F3289 Other specified depressive episodes: Secondary | ICD-10-CM | POA: Diagnosis not present

## 2013-12-29 DIAGNOSIS — F329 Major depressive disorder, single episode, unspecified: Secondary | ICD-10-CM | POA: Diagnosis not present

## 2013-12-29 DIAGNOSIS — I10 Essential (primary) hypertension: Secondary | ICD-10-CM | POA: Diagnosis not present

## 2014-01-07 ENCOUNTER — Ambulatory Visit (INDEPENDENT_AMBULATORY_CARE_PROVIDER_SITE_OTHER): Payer: Medicare Other | Admitting: Orthopedic Surgery

## 2014-01-07 ENCOUNTER — Ambulatory Visit (INDEPENDENT_AMBULATORY_CARE_PROVIDER_SITE_OTHER): Payer: Medicare Other

## 2014-01-07 ENCOUNTER — Encounter: Payer: Self-pay | Admitting: Orthopedic Surgery

## 2014-01-07 VITALS — BP 150/76 | Ht 62.0 in | Wt 130.0 lb

## 2014-01-07 DIAGNOSIS — M19049 Primary osteoarthritis, unspecified hand: Secondary | ICD-10-CM

## 2014-01-07 DIAGNOSIS — M79642 Pain in left hand: Secondary | ICD-10-CM

## 2014-01-07 DIAGNOSIS — M19032 Primary osteoarthritis, left wrist: Secondary | ICD-10-CM

## 2014-01-07 DIAGNOSIS — M79609 Pain in unspecified limb: Secondary | ICD-10-CM

## 2014-01-07 DIAGNOSIS — I6529 Occlusion and stenosis of unspecified carotid artery: Secondary | ICD-10-CM

## 2014-01-07 NOTE — Progress Notes (Signed)
Patient ID: Cindy Schultz, female   DOB: [birth year], 80 y.o.   MRN: [id number] Chief Complaint  Patient presents with  . Hand Pain    left hand pain in thumb joint, no known injury    Today she has a new complaint left thumb pain. The patient had osteoarthritis diagnosed for the right thumb had an injection did well. She thinks she may have similar symptoms. She has pain and crepitance over the left thumb with prominence and subluxation of the joint and aching and swelling.  She also had a consult with Dr. Romona Curls regarding a lipoma in her right leg in the end he advised her not to have it removed she complains of giving way of the right knee. She's also had left knee replacement back in 2005.  Past Medical History  Diagnosis Date  . Macular degeneration   . Glaucoma   . Precancerous changes of the cervix     lesions  . Rectocele   . Diverticulitis   . Lumbar spine pain   . Pain in joints   . Hearing loss   . HTN (hypertension)     cholesterol  . Lymphedema   . DVT (deep venous thrombosis) 07/14/10    LEA doppler   . SOB (shortness of breath) 04/04/2010    2D echo EF=>55%    BP 150/76  Ht 5\' 2"  (1.575 m)  Wt 130 lb (58.968 kg)  BMI 23.77 kg/m2 General appearance is normal, the patient is alert and oriented x3 with normal mood and affect. In regard to the left thumb it is swollen and tender it has crepitance on range of motion she has weakness in pinch is slightly unstable and subluxation. She has normal skin a good pulse and normal sensation is no lymphadenopathy or redness  The x-rays show arthritis of the basilar joint  Osteoarthritis CMC joint left thumb Lipoma right knee Instability right knee  Inject left thumb  Brace right knee  Procedure note Inject left thumb Medications Depo-Medrol 40 mg, lidocaine 1% 2 cc Alcohol was used to prep the skin Ethyl chloride was used to anesthetize the skin 25-gauge needle was used to inject the left [facility] joint No  complications

## 2014-02-09 DIAGNOSIS — L570 Actinic keratosis: Secondary | ICD-10-CM | POA: Diagnosis not present

## 2014-02-09 DIAGNOSIS — D235 Other benign neoplasm of skin of trunk: Secondary | ICD-10-CM | POA: Diagnosis not present

## 2014-02-09 DIAGNOSIS — L299 Pruritus, unspecified: Secondary | ICD-10-CM | POA: Diagnosis not present

## 2014-02-09 DIAGNOSIS — Z85828 Personal history of other malignant neoplasm of skin: Secondary | ICD-10-CM | POA: Diagnosis not present

## 2014-02-11 DIAGNOSIS — H52229 Regular astigmatism, unspecified eye: Secondary | ICD-10-CM | POA: Diagnosis not present

## 2014-02-11 DIAGNOSIS — H353190 Nonexudative age-related macular degeneration, unspecified eye, stage unspecified: Secondary | ICD-10-CM | POA: Diagnosis not present

## 2014-02-11 DIAGNOSIS — H524 Presbyopia: Secondary | ICD-10-CM | POA: Diagnosis not present

## 2014-02-11 DIAGNOSIS — H5210 Myopia, unspecified eye: Secondary | ICD-10-CM | POA: Diagnosis not present

## 2014-02-18 ENCOUNTER — Encounter (INDEPENDENT_AMBULATORY_CARE_PROVIDER_SITE_OTHER): Payer: Medicare Other | Admitting: Ophthalmology

## 2014-02-18 DIAGNOSIS — I10 Essential (primary) hypertension: Secondary | ICD-10-CM | POA: Diagnosis not present

## 2014-02-18 DIAGNOSIS — H3530 Unspecified macular degeneration: Secondary | ICD-10-CM | POA: Diagnosis not present

## 2014-02-18 DIAGNOSIS — H348392 Tributary (branch) retinal vein occlusion, unspecified eye, stable: Secondary | ICD-10-CM | POA: Diagnosis not present

## 2014-02-18 DIAGNOSIS — H35049 Retinal micro-aneurysms, unspecified, unspecified eye: Secondary | ICD-10-CM | POA: Diagnosis not present

## 2014-02-18 DIAGNOSIS — H43819 Vitreous degeneration, unspecified eye: Secondary | ICD-10-CM

## 2014-02-18 DIAGNOSIS — H35039 Hypertensive retinopathy, unspecified eye: Secondary | ICD-10-CM

## 2014-03-11 ENCOUNTER — Other Ambulatory Visit: Payer: Self-pay | Admitting: *Deleted

## 2014-03-11 MED ORDER — GABAPENTIN 100 MG PO CAPS: 100.0000 mg | ORAL_CAPSULE | Freq: Every day | ORAL | Status: DC

## 2014-03-30 DIAGNOSIS — Z23 Encounter for immunization: Secondary | ICD-10-CM | POA: Diagnosis not present

## 2014-04-07 DIAGNOSIS — Z0000 Encounter for general adult medical examination without abnormal findings: Secondary | ICD-10-CM | POA: Diagnosis not present

## 2014-05-19 DIAGNOSIS — H04129 Dry eye syndrome of unspecified lacrimal gland: Secondary | ICD-10-CM | POA: Diagnosis not present

## 2014-06-07 ENCOUNTER — Other Ambulatory Visit (HOSPITAL_COMMUNITY): Payer: Self-pay | Admitting: Pulmonary Disease

## 2014-06-07 ENCOUNTER — Ambulatory Visit (HOSPITAL_COMMUNITY)
Admission: RE | Admit: 2014-06-07 | Discharge: 2014-06-07 | Disposition: A | Discharge status: Home / Self Care | Payer: Medicare Other | Source: Ambulatory Visit | Attending: Pulmonary Disease | Admitting: Pulmonary Disease

## 2014-06-07 DIAGNOSIS — M1612 Unilateral primary osteoarthritis, left hip: Secondary | ICD-10-CM | POA: Diagnosis not present

## 2014-06-07 DIAGNOSIS — M160 Bilateral primary osteoarthritis of hip: Secondary | ICD-10-CM | POA: Diagnosis not present

## 2014-06-07 DIAGNOSIS — M25552 Pain in left hip: Secondary | ICD-10-CM

## 2014-06-07 DIAGNOSIS — M1990 Unspecified osteoarthritis, unspecified site: Secondary | ICD-10-CM | POA: Diagnosis not present

## 2014-06-07 DIAGNOSIS — H409 Unspecified glaucoma: Secondary | ICD-10-CM | POA: Diagnosis not present

## 2014-06-07 DIAGNOSIS — I10 Essential (primary) hypertension: Secondary | ICD-10-CM | POA: Diagnosis not present

## 2014-06-07 DIAGNOSIS — N3281 Overactive bladder: Secondary | ICD-10-CM | POA: Diagnosis not present

## 2014-06-07 DIAGNOSIS — Z23 Encounter for immunization: Secondary | ICD-10-CM | POA: Diagnosis not present

## 2014-06-08 DIAGNOSIS — M9901 Segmental and somatic dysfunction of cervical region: Secondary | ICD-10-CM | POA: Diagnosis not present

## 2014-06-08 DIAGNOSIS — M5032 Other cervical disc degeneration, mid-cervical region: Secondary | ICD-10-CM | POA: Diagnosis not present

## 2014-06-08 DIAGNOSIS — M9903 Segmental and somatic dysfunction of lumbar region: Secondary | ICD-10-CM | POA: Diagnosis not present

## 2014-06-08 DIAGNOSIS — M5136 Other intervertebral disc degeneration, lumbar region: Secondary | ICD-10-CM | POA: Diagnosis not present

## 2014-06-14 DIAGNOSIS — M9901 Segmental and somatic dysfunction of cervical region: Secondary | ICD-10-CM | POA: Diagnosis not present

## 2014-06-14 DIAGNOSIS — M9903 Segmental and somatic dysfunction of lumbar region: Secondary | ICD-10-CM | POA: Diagnosis not present

## 2014-06-14 DIAGNOSIS — M5032 Other cervical disc degeneration, mid-cervical region: Secondary | ICD-10-CM | POA: Diagnosis not present

## 2014-06-14 DIAGNOSIS — M5136 Other intervertebral disc degeneration, lumbar region: Secondary | ICD-10-CM | POA: Diagnosis not present

## 2014-06-16 DIAGNOSIS — L821 Other seborrheic keratosis: Secondary | ICD-10-CM | POA: Diagnosis not present

## 2014-06-16 DIAGNOSIS — L570 Actinic keratosis: Secondary | ICD-10-CM | POA: Diagnosis not present

## 2014-06-16 DIAGNOSIS — R208 Other disturbances of skin sensation: Secondary | ICD-10-CM | POA: Diagnosis not present

## 2014-06-16 DIAGNOSIS — X32XXXD Exposure to sunlight, subsequent encounter: Secondary | ICD-10-CM | POA: Diagnosis not present

## 2014-06-21 DIAGNOSIS — M5136 Other intervertebral disc degeneration, lumbar region: Secondary | ICD-10-CM | POA: Diagnosis not present

## 2014-06-21 DIAGNOSIS — M9903 Segmental and somatic dysfunction of lumbar region: Secondary | ICD-10-CM | POA: Diagnosis not present

## 2014-06-21 DIAGNOSIS — M5032 Other cervical disc degeneration, mid-cervical region: Secondary | ICD-10-CM | POA: Diagnosis not present

## 2014-06-21 DIAGNOSIS — M9901 Segmental and somatic dysfunction of cervical region: Secondary | ICD-10-CM | POA: Diagnosis not present

## 2014-06-30 DIAGNOSIS — M9903 Segmental and somatic dysfunction of lumbar region: Secondary | ICD-10-CM | POA: Diagnosis not present

## 2014-06-30 DIAGNOSIS — M5136 Other intervertebral disc degeneration, lumbar region: Secondary | ICD-10-CM | POA: Diagnosis not present

## 2014-06-30 DIAGNOSIS — M9901 Segmental and somatic dysfunction of cervical region: Secondary | ICD-10-CM | POA: Diagnosis not present

## 2014-06-30 DIAGNOSIS — M5032 Other cervical disc degeneration, mid-cervical region: Secondary | ICD-10-CM | POA: Diagnosis not present

## 2014-07-07 DIAGNOSIS — M5032 Other cervical disc degeneration, mid-cervical region: Secondary | ICD-10-CM | POA: Diagnosis not present

## 2014-07-07 DIAGNOSIS — M9903 Segmental and somatic dysfunction of lumbar region: Secondary | ICD-10-CM | POA: Diagnosis not present

## 2014-07-07 DIAGNOSIS — M9901 Segmental and somatic dysfunction of cervical region: Secondary | ICD-10-CM | POA: Diagnosis not present

## 2014-07-07 DIAGNOSIS — M5136 Other intervertebral disc degeneration, lumbar region: Secondary | ICD-10-CM | POA: Diagnosis not present

## 2014-07-08 DIAGNOSIS — H4011X2 Primary open-angle glaucoma, moderate stage: Secondary | ICD-10-CM | POA: Diagnosis not present

## 2014-07-13 ENCOUNTER — Ambulatory Visit: Payer: Medicare Other | Admitting: Orthopedic Surgery

## 2014-07-14 DIAGNOSIS — M5136 Other intervertebral disc degeneration, lumbar region: Secondary | ICD-10-CM | POA: Diagnosis not present

## 2014-07-14 DIAGNOSIS — M5032 Other cervical disc degeneration, mid-cervical region: Secondary | ICD-10-CM | POA: Diagnosis not present

## 2014-07-14 DIAGNOSIS — M9903 Segmental and somatic dysfunction of lumbar region: Secondary | ICD-10-CM | POA: Diagnosis not present

## 2014-07-14 DIAGNOSIS — M9901 Segmental and somatic dysfunction of cervical region: Secondary | ICD-10-CM | POA: Diagnosis not present

## 2014-07-21 DIAGNOSIS — Z1231 Encounter for screening mammogram for malignant neoplasm of breast: Secondary | ICD-10-CM | POA: Diagnosis not present

## 2014-07-22 DIAGNOSIS — M5136 Other intervertebral disc degeneration, lumbar region: Secondary | ICD-10-CM | POA: Diagnosis not present

## 2014-07-22 DIAGNOSIS — M9901 Segmental and somatic dysfunction of cervical region: Secondary | ICD-10-CM | POA: Diagnosis not present

## 2014-07-22 DIAGNOSIS — M5032 Other cervical disc degeneration, mid-cervical region: Secondary | ICD-10-CM | POA: Diagnosis not present

## 2014-07-22 DIAGNOSIS — M9903 Segmental and somatic dysfunction of lumbar region: Secondary | ICD-10-CM | POA: Diagnosis not present

## 2014-07-28 DIAGNOSIS — C4490 Unspecified malignant neoplasm of skin, unspecified: Secondary | ICD-10-CM | POA: Diagnosis not present

## 2014-07-28 DIAGNOSIS — R490 Dysphonia: Secondary | ICD-10-CM | POA: Diagnosis not present

## 2014-07-28 DIAGNOSIS — M810 Age-related osteoporosis without current pathological fracture: Secondary | ICD-10-CM | POA: Diagnosis not present

## 2014-07-28 DIAGNOSIS — I10 Essential (primary) hypertension: Secondary | ICD-10-CM | POA: Diagnosis not present

## 2014-07-29 ENCOUNTER — Encounter: Payer: Self-pay | Admitting: Orthopedic Surgery

## 2014-07-29 ENCOUNTER — Ambulatory Visit (INDEPENDENT_AMBULATORY_CARE_PROVIDER_SITE_OTHER): Payer: Medicare Other | Admitting: Orthopedic Surgery

## 2014-07-29 VITALS — BP 137/68 | Ht 62.0 in | Wt 130.0 lb

## 2014-07-29 DIAGNOSIS — M5136 Other intervertebral disc degeneration, lumbar region: Secondary | ICD-10-CM | POA: Diagnosis not present

## 2014-07-29 DIAGNOSIS — Z96652 Presence of left artificial knee joint: Secondary | ICD-10-CM

## 2014-07-29 DIAGNOSIS — M9901 Segmental and somatic dysfunction of cervical region: Secondary | ICD-10-CM | POA: Diagnosis not present

## 2014-07-29 DIAGNOSIS — M129 Arthropathy, unspecified: Secondary | ICD-10-CM | POA: Diagnosis not present

## 2014-07-29 DIAGNOSIS — M5032 Other cervical disc degeneration, mid-cervical region: Secondary | ICD-10-CM | POA: Diagnosis not present

## 2014-07-29 DIAGNOSIS — M19031 Primary osteoarthritis, right wrist: Secondary | ICD-10-CM

## 2014-07-29 DIAGNOSIS — M19032 Primary osteoarthritis, left wrist: Secondary | ICD-10-CM

## 2014-07-29 DIAGNOSIS — M19042 Primary osteoarthritis, left hand: Secondary | ICD-10-CM | POA: Diagnosis not present

## 2014-07-29 DIAGNOSIS — M19041 Primary osteoarthritis, right hand: Secondary | ICD-10-CM | POA: Diagnosis not present

## 2014-07-29 DIAGNOSIS — M160 Bilateral primary osteoarthritis of hip: Secondary | ICD-10-CM

## 2014-07-29 DIAGNOSIS — M9903 Segmental and somatic dysfunction of lumbar region: Secondary | ICD-10-CM | POA: Diagnosis not present

## 2014-07-29 MED ORDER — DICLOFENAC POTASSIUM 50 MG PO TABS: 50.0000 mg | ORAL_TABLET | Freq: Two times a day (BID) | ORAL | Status: DC

## 2014-07-29 NOTE — Progress Notes (Signed)
Chief Complaint  Patient presents with  . Hip Pain    left hip pain, REFERRED BY HAWKINS    Patient presents with bilateral hip pain. On the right side she says she has trouble turning over in bed on the left side she has more trouble with range of motion movement and pressure in terms of ambulation. Her pain is mild to moderately. Partially relieved by Tylenol. No other treatment at this point. She is allergic to tramadol which causes her to have an upset stomach and makes it difficult for her to drive she also listed codeine as an allergy.  Review of systems she has multiple joint pain. She has difficulty hearing. She has numbness and tingling in her right upper extremity from chronic carpal tunnel syndrome.  Past Medical History  Diagnosis Date  . Macular degeneration   . Glaucoma   . Precancerous changes of the cervix     lesions  . Rectocele   . Diverticulitis   . Lumbar spine pain   . Pain in joints   . Hearing loss   . HTN (hypertension)     cholesterol  . Lymphedema   . DVT (deep venous thrombosis) 07/14/10    LEA doppler   . SOB (shortness of breath) 04/04/2010    2D echo EF=>55%    BP 137/68 mmHg  Ht 5\' 2"  (1.575 m)  Wt 130 lb (58.968 kg)  BMI 23.77 kg/m2 Stable vital signs appearance normal other than her being hard of hearing she is otherwise oriented to person place and time mood and affect are normal gait is slow but steady  Lower extremities she has more range of motion in the right hip although painful than the left. Flexion past 90 in both hips both hips stable. Muscle tone in both lower extremities normal skin intact without rash pulses are good distally sensation is normal she does have a skin lesion on the right lower leg from a scrape   Imaging review includes lumbar spine which show severe degenerative scoliosis Osteoarthritis is noted in the right hip greater than left with severe arthritis on the right moderate on the  left  Impression  Osteoarthritis right greater than left hip Osteoarthritis right and left CMC joint of the thumb Chronic right carpal tunnel syndrome  Plan recommend diclofenac 50 mg twice a day. At 81 years old and independent and living alone at home she is not a good candidate at this time for hip replacement

## 2014-08-09 DIAGNOSIS — M8580 Other specified disorders of bone density and structure, unspecified site: Secondary | ICD-10-CM | POA: Diagnosis not present

## 2014-08-11 DIAGNOSIS — X32XXXD Exposure to sunlight, subsequent encounter: Secondary | ICD-10-CM | POA: Diagnosis not present

## 2014-08-11 DIAGNOSIS — C44320 Squamous cell carcinoma of skin of unspecified parts of face: Secondary | ICD-10-CM | POA: Diagnosis not present

## 2014-08-11 DIAGNOSIS — L570 Actinic keratosis: Secondary | ICD-10-CM | POA: Diagnosis not present

## 2014-08-11 DIAGNOSIS — C44329 Squamous cell carcinoma of skin of other parts of face: Secondary | ICD-10-CM | POA: Diagnosis not present

## 2014-08-17 DIAGNOSIS — N3281 Overactive bladder: Secondary | ICD-10-CM | POA: Diagnosis not present

## 2014-08-17 DIAGNOSIS — R490 Dysphonia: Secondary | ICD-10-CM | POA: Diagnosis not present

## 2014-08-17 DIAGNOSIS — I10 Essential (primary) hypertension: Secondary | ICD-10-CM | POA: Diagnosis not present

## 2014-08-17 DIAGNOSIS — E785 Hyperlipidemia, unspecified: Secondary | ICD-10-CM | POA: Diagnosis not present

## 2014-08-19 DIAGNOSIS — N3281 Overactive bladder: Secondary | ICD-10-CM | POA: Diagnosis not present

## 2014-08-19 DIAGNOSIS — I10 Essential (primary) hypertension: Secondary | ICD-10-CM | POA: Diagnosis not present

## 2014-08-19 DIAGNOSIS — R490 Dysphonia: Secondary | ICD-10-CM | POA: Diagnosis not present

## 2014-08-19 DIAGNOSIS — E785 Hyperlipidemia, unspecified: Secondary | ICD-10-CM | POA: Diagnosis not present

## 2014-08-19 DIAGNOSIS — J387 Other diseases of larynx: Secondary | ICD-10-CM | POA: Diagnosis not present

## 2014-09-06 DIAGNOSIS — F329 Major depressive disorder, single episode, unspecified: Secondary | ICD-10-CM | POA: Diagnosis not present

## 2014-09-06 DIAGNOSIS — M1990 Unspecified osteoarthritis, unspecified site: Secondary | ICD-10-CM | POA: Diagnosis not present

## 2014-09-06 DIAGNOSIS — I10 Essential (primary) hypertension: Secondary | ICD-10-CM | POA: Diagnosis not present

## 2014-09-06 DIAGNOSIS — R490 Dysphonia: Secondary | ICD-10-CM | POA: Diagnosis not present

## 2014-09-08 ENCOUNTER — Inpatient Hospital Stay (HOSPITAL_COMMUNITY): Payer: Medicare Other

## 2014-09-08 ENCOUNTER — Inpatient Hospital Stay (HOSPITAL_COMMUNITY)
Admission: EM | Admit: 2014-09-08 | Discharge: 2014-09-11 | DRG: 378 | Disposition: A | Discharge status: Home / Self Care | Payer: Medicare Other | Attending: Pulmonary Disease | Admitting: Pulmonary Disease

## 2014-09-08 ENCOUNTER — Encounter (HOSPITAL_COMMUNITY): Payer: Self-pay

## 2014-09-08 DIAGNOSIS — Z8249 Family history of ischemic heart disease and other diseases of the circulatory system: Secondary | ICD-10-CM

## 2014-09-08 DIAGNOSIS — R109 Unspecified abdominal pain: Secondary | ICD-10-CM

## 2014-09-08 DIAGNOSIS — K625 Hemorrhage of anus and rectum: Secondary | ICD-10-CM | POA: Diagnosis not present

## 2014-09-08 DIAGNOSIS — D62 Acute posthemorrhagic anemia: Secondary | ICD-10-CM | POA: Diagnosis not present

## 2014-09-08 DIAGNOSIS — Z833 Family history of diabetes mellitus: Secondary | ICD-10-CM

## 2014-09-08 DIAGNOSIS — M179 Osteoarthritis of knee, unspecified: Secondary | ICD-10-CM | POA: Diagnosis present

## 2014-09-08 DIAGNOSIS — N189 Chronic kidney disease, unspecified: Secondary | ICD-10-CM | POA: Diagnosis present

## 2014-09-08 DIAGNOSIS — I10 Essential (primary) hypertension: Secondary | ICD-10-CM | POA: Diagnosis present

## 2014-09-08 DIAGNOSIS — M1710 Unilateral primary osteoarthritis, unspecified knee: Secondary | ICD-10-CM | POA: Diagnosis present

## 2014-09-08 DIAGNOSIS — Z823 Family history of stroke: Secondary | ICD-10-CM | POA: Diagnosis not present

## 2014-09-08 DIAGNOSIS — H409 Unspecified glaucoma: Secondary | ICD-10-CM | POA: Diagnosis present

## 2014-09-08 DIAGNOSIS — K922 Gastrointestinal hemorrhage, unspecified: Secondary | ICD-10-CM | POA: Diagnosis not present

## 2014-09-08 DIAGNOSIS — K921 Melena: Secondary | ICD-10-CM

## 2014-09-08 DIAGNOSIS — Z96652 Presence of left artificial knee joint: Secondary | ICD-10-CM | POA: Diagnosis present

## 2014-09-08 DIAGNOSIS — D539 Nutritional anemia, unspecified: Secondary | ICD-10-CM | POA: Diagnosis present

## 2014-09-08 DIAGNOSIS — H3530 Unspecified macular degeneration: Secondary | ICD-10-CM | POA: Diagnosis present

## 2014-09-08 DIAGNOSIS — N289 Disorder of kidney and ureter, unspecified: Secondary | ICD-10-CM

## 2014-09-08 DIAGNOSIS — Z825 Family history of asthma and other chronic lower respiratory diseases: Secondary | ICD-10-CM | POA: Diagnosis not present

## 2014-09-08 DIAGNOSIS — D509 Iron deficiency anemia, unspecified: Secondary | ICD-10-CM | POA: Diagnosis not present

## 2014-09-08 DIAGNOSIS — I129 Hypertensive chronic kidney disease with stage 1 through stage 4 chronic kidney disease, or unspecified chronic kidney disease: Secondary | ICD-10-CM | POA: Diagnosis present

## 2014-09-08 DIAGNOSIS — K5731 Diverticulosis of large intestine without perforation or abscess with bleeding: Secondary | ICD-10-CM

## 2014-09-08 DIAGNOSIS — K5730 Diverticulosis of large intestine without perforation or abscess without bleeding: Secondary | ICD-10-CM | POA: Diagnosis not present

## 2014-09-08 DIAGNOSIS — M545 Low back pain, unspecified: Secondary | ICD-10-CM | POA: Diagnosis present

## 2014-09-08 DIAGNOSIS — D649 Anemia, unspecified: Secondary | ICD-10-CM | POA: Diagnosis not present

## 2014-09-08 LAB — COMPREHENSIVE METABOLIC PANEL
ALT: 18 U/L (ref 0–35)
AST: 21 U/L (ref 0–37)
Albumin: 4 g/dL (ref 3.5–5.2)
Alkaline Phosphatase: 68 U/L (ref 39–117)
Anion gap: 7 (ref 5–15)
BILIRUBIN TOTAL: 0.5 mg/dL (ref 0.3–1.2)
BUN: 52 mg/dL — ABNORMAL HIGH (ref 6–23)
CALCIUM: 9.7 mg/dL (ref 8.4–10.5)
CO2: 25 mmol/L (ref 19–32)
Chloride: 107 mmol/L (ref 96–112)
Creatinine, Ser: 1.99 mg/dL — ABNORMAL HIGH (ref 0.50–1.10)
GFR calc Af Amer: 25 mL/min — ABNORMAL LOW (ref >90)
GFR calc non Af Amer: 21 mL/min — ABNORMAL LOW (ref >90)
GLUCOSE: 96 mg/dL (ref 70–99)
Potassium: 4.7 mmol/L (ref 3.5–5.1)
Sodium: 139 mmol/L (ref 135–145)
Total Protein: 7.2 g/dL (ref 6.0–8.3)

## 2014-09-08 LAB — TYPE AND SCREEN
ABO/RH(D): A POS
ANTIBODY SCREEN: NEGATIVE

## 2014-09-08 LAB — CBC WITH DIFFERENTIAL/PLATELET
BASOS ABS: 0 10*3/uL (ref 0.0–0.1)
BASOS PCT: 0 % (ref 0–1)
BASOS PCT: 0 % (ref 0–1)
Basophils Absolute: 0 10*3/uL (ref 0.0–0.1)
Eosinophils Absolute: 0.2 10*3/uL (ref 0.0–0.7)
Eosinophils Absolute: 0.2 10*3/uL (ref 0.0–0.7)
Eosinophils Relative: 2 % (ref 0–5)
Eosinophils Relative: 3 % (ref 0–5)
HCT: 33.5 % — ABNORMAL LOW (ref 36.0–46.0)
HEMATOCRIT: 29.7 % — AB (ref 36.0–46.0)
Hemoglobin: 11 g/dL — ABNORMAL LOW (ref 12.0–15.0)
Hemoglobin: 9.9 g/dL — ABNORMAL LOW (ref 12.0–15.0)
LYMPHS PCT: 25 % (ref 12–46)
Lymphocytes Relative: 26 % (ref 12–46)
Lymphs Abs: 2.1 10*3/uL (ref 0.7–4.0)
Lymphs Abs: 1.9 10*3/uL (ref 0.7–4.0)
MCH: 33.2 pg (ref 26.0–34.0)
MCH: 33.4 pg (ref 26.0–34.0)
MCHC: 32.8 g/dL (ref 30.0–36.0)
MCHC: 33.3 g/dL (ref 30.0–36.0)
MCV: 101.2 fL — ABNORMAL HIGH (ref 78.0–100.0)
MCV: 100.3 fL — ABNORMAL HIGH (ref 78.0–100.0)
Monocytes Absolute: 1 10*3/uL (ref 0.1–1.0)
Monocytes Absolute: 0.9 10*3/uL (ref 0.1–1.0)
Monocytes Relative: 12 % (ref 3–12)
Monocytes Relative: 12 % (ref 3–12)
NEUTROS ABS: 4.4 10*3/uL (ref 1.7–7.7)
NEUTROS PCT: 60 % (ref 43–77)
Neutro Abs: 4.6 10*3/uL (ref 1.7–7.7)
Neutrophils Relative %: 60 % (ref 43–77)
PLATELETS: 221 10*3/uL (ref 150–400)
Platelets: 254 10*3/uL (ref 150–400)
RBC: 3.31 MIL/uL — ABNORMAL LOW (ref 3.87–5.11)
RBC: 2.96 MIL/uL — ABNORMAL LOW (ref 3.87–5.11)
RDW: 14.5 % (ref 11.5–15.5)
RDW: 14.4 % (ref 11.5–15.5)
WBC: 7.8 10*3/uL (ref 4.0–10.5)
WBC: 7.5 10*3/uL (ref 4.0–10.5)

## 2014-09-08 LAB — LIPASE, BLOOD: Lipase: 42 U/L (ref 11–59)

## 2014-09-08 LAB — I-STAT CG4 LACTIC ACID, ED: Lactic Acid, Venous: 0.59 mmol/L (ref 0.5–2.0)

## 2014-09-08 MED ORDER — NIFEDIPINE ER OSMOTIC RELEASE 30 MG PO TB24
30.0000 mg | ORAL_TABLET | Freq: Every day | ORAL | Status: DC
  Administered 2014-09-08 – 2014-09-11 (×4): 30 mg via ORAL
  Filled 2014-09-08 (×4): qty 1

## 2014-09-08 MED ORDER — ONDANSETRON HCL 4 MG/2ML IJ SOLN: 4.0000 mg | Freq: Four times a day (QID) | INTRAMUSCULAR | Status: DC | PRN

## 2014-09-08 MED ORDER — OXYBUTYNIN CHLORIDE ER 5 MG PO TB24
5.0000 mg | ORAL_TABLET | Freq: Every day | ORAL | Status: DC
  Administered 2014-09-09 – 2014-09-11 (×3): 5 mg via ORAL
  Filled 2014-09-08 (×5): qty 1

## 2014-09-08 MED ORDER — GABAPENTIN 100 MG PO CAPS
100.0000 mg | ORAL_CAPSULE | Freq: Every day | ORAL | Status: DC
  Administered 2014-09-08 – 2014-09-10 (×3): 100 mg via ORAL
  Filled 2014-09-08 (×3): qty 1

## 2014-09-08 MED ORDER — PANTOPRAZOLE SODIUM 40 MG PO TBEC
40.0000 mg | DELAYED_RELEASE_TABLET | Freq: Every day | ORAL | Status: DC
  Administered 2014-09-08 – 2014-09-09 (×2): 40 mg via ORAL
  Filled 2014-09-08 (×2): qty 1

## 2014-09-08 MED ORDER — SODIUM CHLORIDE 0.9 % IV SOLN
INTRAVENOUS | Status: DC
  Administered 2014-09-08 – 2014-09-10 (×2): via INTRAVENOUS

## 2014-09-08 MED ORDER — IOHEXOL 300 MG/ML  SOLN: 50.0000 mL | Freq: Once | INTRAMUSCULAR | Status: AC | PRN

## 2014-09-08 MED ORDER — ONDANSETRON HCL 4 MG PO TABS: 4.0000 mg | ORAL_TABLET | Freq: Four times a day (QID) | ORAL | Status: DC | PRN

## 2014-09-08 NOTE — ED Notes (Signed)
After arriving to the ER.  Pt had second bloody BM.  Witnessed by nurse.  Medium sized bowel movement bright red in color.

## 2014-09-08 NOTE — ED Provider Notes (Signed)
CSN: [id number]     Arrival date & time 09/08/14  1757 History   This chart was scribed for Delora Fuel, MD by Chester Holstein, ED Scribe. This patient was seen in room APA04/APA04 and the patient's care was started at 6:32 PM.    Chief Complaint  Patient presents with  . GI Bleeding     The history is provided by the patient. No language interpreter was used.    HPI Comments: Cindy Schultz is a 81 y.o. female with h/o hemorrhoidectomy, rectocele, HTN, lymphedema, DVT, and SOB who presents to the Emergency Department complaining of hematochezia with onset this morning. Pt notes she thought she was experiencing an episode of diarrhea but when she used the restroom noticed bright red blood and clotting. Pt notes associated generalized weakness. She denies abdominal pain but notes some discomfort in left side. Pt with h/o diverticulosis with hemorrhage and GI bleed. Pt is not on blood thinners or ASA. Pt denies nausea, fever, chills, dizziness, and lightheadedness.  Past Medical History  Diagnosis Date  . Macular degeneration   . Glaucoma   . Precancerous changes of the cervix     lesions  . Rectocele   . Diverticulitis   . Lumbar spine pain   . Pain in joints   . Hearing loss   . HTN (hypertension)     cholesterol  . Lymphedema   . DVT (deep venous thrombosis) 07/14/10    LEA doppler   . SOB (shortness of breath) 04/04/2010    2D echo EF=>55%   Past Surgical History  Procedure Laterality Date  . Salk    . Tracheotomy    . Fallen uterus    . Ovarian cyst surgery    . Bladder tac    . Hemmorrhoidectomy    . Met osteostomy    . Left total knee replacement  05/02/04    [name]  . Morton's nueroma    . Elevated metatarsal arch    . Rupture rt groin    . Appendix    . Rectocele repair    . Corneal erosion      cataracts   . Colonoscopy  09/20/2011    Procedure: COLONOSCOPY;  Surgeon: Beryle Beams, MD;  Location: [facility] ENDOSCOPY;  Service: Endoscopy;  Laterality: N/A;  .  Abdominal hysterectomy     Family History  Problem Relation Age of Onset  . Stroke Mother   . Diabetes Mother   . Heart attack Father   . COPD Paternal Grandfather   . Dementia Sister    History  Substance Use Topics  . Smoking status: Never Smoker   . Smokeless tobacco: Not on file  . Alcohol Use: No   OB History    No data available     Review of Systems  Constitutional: Negative for fever and chills.  Gastrointestinal: Positive for abdominal pain (discomfort to left side), diarrhea and blood in stool. Negative for nausea.  Neurological: Negative for dizziness and light-headedness.      Allergies  Codeine; Hydrocodone; Promethazine hcl; and Tramadol hcl  Home Medications   Prior to Admission medications   Medication Sig Start Date End Date Taking? Authorizing Provider  Calcium 600-200 MG-UNIT per tablet Take 1 tablet by mouth daily.     Yes Historical Provider, MD  diclofenac (CATAFLAM) 50 MG tablet Take 1 tablet (50 mg total) by mouth 2 (two) times daily. 07/29/14  Yes Carole Civil, MD  dorzolamide-timolol (COSOPT) 22.3-6.8 MG/ML ophthalmic solution Place  1 drop into both eyes 2 (two) times daily.     Yes Historical Provider, MD  ferrous sulfate 325 (65 FE) MG tablet Take 325 mg by mouth daily with breakfast.   Yes Historical Provider, MD  fluticasone (FLONASE) 50 MCG/ACT nasal spray Place 2 sprays into the nose daily.     Yes Historical Provider, MD  gabapentin (NEURONTIN) 100 MG capsule Take 1 capsule (100 mg total) by mouth at bedtime. 03/11/14  Yes Carole Civil, MD  glucosamine-chondroitin 500-400 MG tablet Take 1 tablet by mouth daily.    Yes Historical Provider, MD  latanoprost (XALATAN) 0.005 % ophthalmic solution Place 1 drop into the right eye at bedtime.    Yes Historical Provider, MD  lisinopril (PRINIVIL,ZESTRIL) 20 MG tablet Take 1 tablet by mouth daily as needed (when blood pressure is high).  11/03/12  Yes Historical Provider, MD  Multiple  Vitamins-Minerals (OCUVITE PO) Take 1 tablet by mouth daily.   Yes Historical Provider, MD  NIFEdipine (PROCARDIA-XL/ADALAT CC) 30 MG 24 hr tablet Take 30 mg by mouth daily.    Yes Historical Provider, MD  oxybutynin (DITROPAN-XL) 5 MG 24 hr tablet Take 5 mg by mouth daily.   Yes Historical Provider, MD  Polyethyl Glycol-Propyl Glycol (SYSTANE ULTRA OP) Apply 1 drop to eye 3 (three) times daily.    Yes Historical Provider, MD  acetaminophen (TYLENOL) 500 MG tablet Take 500 mg by mouth every 6 (six) hours as needed (pain).     Historical Provider, MD  colesevelam (WELCHOL) 625 MG tablet Take 1,875 mg by mouth 2 (two) times daily with a meal.      Historical Provider, MD   BP 153/70 mmHg  Pulse 76  Temp(Src) 97.7 F (36.5 C) (Oral)  Resp 22  SpO2 100% Physical Exam  Constitutional: She is oriented to person, place, and time. She appears well-developed and well-nourished.  HENT:  Head: Normocephalic.  Eyes: Conjunctivae are normal. Pupils are equal, round, and reactive to light.  Mild conjunctival pallor  Neck: Normal range of motion. Neck supple. No JVD present.  Cardiovascular: Normal rate, regular rhythm and normal heart sounds.   No murmur heard. Pulmonary/Chest: Effort normal and breath sounds normal. She has no wheezes. She has no rales. She exhibits no tenderness.  Abdominal: Soft. Bowel sounds are normal. She exhibits no distension and no mass. There is tenderness (mild diffusely). There is no rebound and no guarding.  Musculoskeletal: Normal range of motion. She exhibits no edema.  Lymphadenopathy:    She has no cervical adenopathy.  Neurological: She is alert and oriented to person, place, and time.  Skin: Skin is warm and dry. No rash noted.  Psychiatric: She has a normal mood and affect. Her behavior is normal. Thought content normal.  Nursing note and vitals reviewed.   ED Course  Procedures (including critical care time) DIAGNOSTIC STUDIES: Oxygen Saturation is 100% on  room air, normal by my interpretation.    COORDINATION OF CARE: 6:49 PM Discussed treatment plan with patient at beside plan to admit for observation, the patient agrees with the plan and has no further questions at this time.   Labs Review Labs Reviewed  COMPREHENSIVE METABOLIC PANEL - Abnormal; Notable for the following:    BUN 52 (*)    Creatinine, Ser 1.99 (*)    GFR calc non Af Amer 21 (*)    GFR calc Af Amer 25 (*)    All other components within normal limits  CBC WITH DIFFERENTIAL/PLATELET -  Abnormal; Notable for the following:    RBC 3.31 (*)    Hemoglobin 11.0 (*)    HCT 33.5 (*)    MCV 101.2 (*)    All other components within normal limits  LIPASE, BLOOD  I-STAT CG4 LACTIC ACID, ED  TYPE AND SCREEN    Imaging Review No results found.   EKG Interpretation None      Results for orders placed or performed during the hospital encounter of 09/08/14  Comprehensive metabolic panel  Result Value Ref Range   Sodium 139 135 - 145 mmol/L   Potassium 4.7 3.5 - 5.1 mmol/L   Chloride 107 96 - 112 mmol/L   CO2 25 19 - 32 mmol/L   Glucose, Bld 96 70 - 99 mg/dL   BUN 52 (H) 6 - 23 mg/dL   Creatinine, Ser 1.99 (H) 0.50 - 1.10 mg/dL   Calcium 9.7 8.4 - 10.5 mg/dL   Total Protein 7.2 6.0 - 8.3 g/dL   Albumin 4.0 3.5 - 5.2 g/dL   AST 21 0 - 37 U/L   ALT 18 0 - 35 U/L   Alkaline Phosphatase 68 39 - 117 U/L   Total Bilirubin 0.5 0.3 - 1.2 mg/dL   GFR calc non Af Amer 21 (L) >90 mL/min   GFR calc Af Amer 25 (L) >90 mL/min   Anion gap 7 5 - 15  Lipase, blood  Result Value Ref Range   Lipase 42 11 - 59 U/L  CBC with Differential  Result Value Ref Range   WBC 7.8 4.0 - 10.5 K/uL   RBC 3.31 (L) 3.87 - 5.11 MIL/uL   Hemoglobin 11.0 (L) 12.0 - 15.0 g/dL   HCT 33.5 (L) 36.0 - 46.0 %   MCV 101.2 (H) 78.0 - 100.0 fL   MCH 33.2 26.0 - 34.0 pg   MCHC 32.8 30.0 - 36.0 g/dL   RDW 14.5 11.5 - 15.5 %   Platelets 254 150 - 400 K/uL   Neutrophils Relative % 60 43 - 77 %   Neutro  Abs 4.6 1.7 - 7.7 K/uL   Lymphocytes Relative 26 12 - 46 %   Lymphs Abs 2.1 0.7 - 4.0 K/uL   Monocytes Relative 12 3 - 12 %   Monocytes Absolute 1.0 0.1 - 1.0 K/uL   Eosinophils Relative 2 0 - 5 %   Eosinophils Absolute 0.2 0.0 - 0.7 K/uL   Basophils Relative 0 0 - 1 %   Basophils Absolute 0.0 0.0 - 0.1 K/uL  I-Stat CG4 Lactic Acid, ED  Result Value Ref Range   Lactic Acid, Venous 0.59 0.5 - 2.0 mmol/L  Type and screen  Result Value Ref Range   ABO/RH(D) A POS    Antibody Screen NEG    Sample Expiration 09/11/2014    MDM   Final diagnoses:  Rectal bleeding  Macrocytic anemia  Renal insufficiency    Rectal bleeding in patient with known history of bleeding diverticuli. Old records were reviewed confirming hospitalization in 2013 for rectal bleeding which was felt to be from diverticuli. I strongly suspect that she has a recurrence of bleeding diverticuli. Orthostatic vital signs are obtained showing no significant change in pulse or blood pressure. Lactic acid level has come back normal and hemoglobin is actually improved over last known value from 3 years ago. Renal insufficiency is noted with creatinine level unchanged but significant increase in BUN which might suggest an upper GI source. Case is discussed with Dr. Ernestina Patches [facility]  who agrees to come to evaluate the patient for admission.   I personally performed the services described in this documentation, which was scribed in my presence. The recorded information has been reviewed and is accurate.      Delora Fuel, MD 30/86/57 [id number]

## 2014-09-08 NOTE — H&P (Signed)
Hospitalist Admission History and Physical  Patient name: Cindy Schultz Medical record number: [id number] Date of birth: 1924/03/05 Age: 81 y.o. Gender: female  Primary Care Provider: Alonza Bogus, MD  Chief Complaint: GIB, anemia   History of Present Illness:This is a 81 y.o. year old female with significant past medical history of diverticulosis, HTN, chronic kidney disease presenting with GIB. Patient reports progressive onset of lower abdominal pain and bright red blood per rectum over the course of day. Reports 3 bloody bowel movements today. Denies any recent NSAID use. States that she was admitted approximately 3 years ago for similar symptoms except worsen back in 2. Was admitted at [facility]. Reports having diverticulosis on her colonoscopy. Patient says she's avoided NSAIDs since this point. Denies any recent strenuous activity. No fevers or chills. No vomiting. Abdominal pain predominantly in the lower abdomen. Presented to the ER afebrile, hemodynamically stable. Hemoglobin 11, creatinine 1.99 (baseline creatinine 1.9). Assessment and Plan: Cindy Schultz is a 81 y.o. year old female presenting with GIB   Active Problems:   GIB (gastrointestinal bleeding)   1- GIB -Likely secondary to baseline diverticular disease -?noted diclofenac on med list -Mild to moderate lower abdominal pain on exam-CT abdomen and pelvis to better assess -Serial CBCs -Type and screen -Hemodynamically stable at present -Hemoglobin lower limits of normal at present -HOLD offending agents  -Stepdown bed -Follow closely -GI consult in the morning  2-HTN -BP stable  -cont home regimen   FEN/GI: NPO. PPI Prophylaxis: SCDs  Disposition: pending further evaluation  Code Status:Full Code    Patient Active Problem List   Diagnosis Date Noted  . GIB (gastrointestinal bleeding) 09/08/2014  . Occlusion and stenosis of carotid artery without mention of cerebral infarction  12/14/2013  . Osteoarthritis of left knee 04/23/2013  . Thumb pain 10/22/2012  . De Quervain's syndrome (tenosynovitis) 10/22/2012  . [facility] DJD(carpometacarpal degenerative joint disease), localized primary 10/22/2012  . CTS (carpal tunnel syndrome) 10/22/2012  . Trigger thumb of right hand 10/22/2012  . Pes anserinus bursitis of right knee 06/26/2012  . OA (osteoarthritis) of knee 06/26/2012  . Leg pain, left 06/26/2012  . Radicular pain of left lower extremity 06/26/2012  . Diverticulosis of colon with hemorrhage 09/22/2011  . Syncopal episodes 09/20/2011  . GI bleed 09/19/2011  . HTN (hypertension) 09/19/2011  . Anemia due to blood loss, acute 09/19/2011  . Abnormality of gait 01/03/2011  . Personal history of fall 12/27/2010  . DISLOCATION CLOSED SHOULDER NEC 04/25/2010  . ANSERINE BURSITIS, LEFT 02/13/2010  . KNEE PAIN 12/22/2009  . LEG PAIN, BILATERAL 12/22/2009  . TOTAL KNEE FOLLOW-UP 12/22/2009  . HIP PAIN 02/07/2009  . LOW BACK PAIN 02/07/2009   Past Medical History: Past Medical History  Diagnosis Date  . Macular degeneration   . Glaucoma   . Precancerous changes of the cervix     lesions  . Rectocele   . Diverticulitis   . Lumbar spine pain   . Pain in joints   . Hearing loss   . HTN (hypertension)     cholesterol  . Lymphedema   . DVT (deep venous thrombosis) 07/14/10    LEA doppler   . SOB (shortness of breath) 04/04/2010    2D echo EF=>55%    Past Surgical History: Past Surgical History  Procedure Laterality Date  . Salk    . Tracheotomy    . Fallen uterus    . Ovarian cyst surgery    . Bladder tac    .  Hemmorrhoidectomy    . Met osteostomy    . Left total knee replacement  05/02/04    [name]  . Morton's nueroma    . Elevated metatarsal arch    . Rupture rt groin    . Appendix    . Rectocele repair    . Corneal erosion      cataracts   . Colonoscopy  09/20/2011    Procedure: COLONOSCOPY;  Surgeon: Beryle Beams, MD;  Location: [facility]  ENDOSCOPY;  Service: Endoscopy;  Laterality: N/A;  . Abdominal hysterectomy      Social History: History   Social History  . Marital Status: Divorced    Spouse Name: N/A  . Number of Children: N/A  . Years of Education: N/A   Social History Main Topics  . Smoking status: Never Smoker   . Smokeless tobacco: Not on file  . Alcohol Use: No  . Drug Use: No  . Sexual Activity: Not on file   Other Topics Concern  . None   Social History Narrative    Family History: Family History  Problem Relation Age of Onset  . Stroke Mother   . Diabetes Mother   . Heart attack Father   . COPD Paternal Grandfather   . Dementia Sister     Allergies: Allergies  Allergen Reactions  . Codeine Nausea And Vomiting  . Hydrocodone Other (See Comments)    Unknown Reaction   . Promethazine Hcl     REACTION: deathly sick  . Tramadol Hcl Other (See Comments)    Motion Sickness    Current Facility-Administered Medications  Medication Dose Route Frequency Provider Last Rate Last Dose  . 0.9 %  sodium chloride infusion   Intravenous Continuous Deneise Lever, MD      . gabapentin (NEURONTIN) capsule 100 mg  100 mg Oral QHS Deneise Lever, MD      . NIFEdipine (PROCARDIA-XL/ADALAT-CC/NIFEDICAL-XL) 24 hr tablet 30 mg  30 mg Oral Daily Deneise Lever, MD      . ondansetron [facility]) tablet 4 mg  4 mg Oral Q6H PRN Deneise Lever, MD       Or  . ondansetron [facility]) injection 4 mg  4 mg Intravenous Q6H PRN Deneise Lever, MD      . Derrill Memo ON 09/09/2014] oxybutynin (DITROPAN-XL) 24 hr tablet 5 mg  5 mg Oral Daily Deneise Lever, MD       Current Outpatient Prescriptions  Medication Sig Dispense Refill  . Calcium 600-200 MG-UNIT per tablet Take 1 tablet by mouth daily.      . diclofenac (CATAFLAM) 50 MG tablet Take 1 tablet (50 mg total) by mouth 2 (two) times daily. 60 tablet 5  . dorzolamide-timolol (COSOPT) 22.3-6.8 MG/ML ophthalmic solution Place 1 drop into both eyes 2 (two) times daily.       . ferrous sulfate 325 (65 FE) MG tablet Take 325 mg by mouth daily with breakfast.    . fluticasone (FLONASE) 50 MCG/ACT nasal spray Place 2 sprays into the nose daily.      Marland Kitchen gabapentin (NEURONTIN) 100 MG capsule Take 1 capsule (100 mg total) by mouth at bedtime. 30 capsule 5  . glucosamine-chondroitin 500-400 MG tablet Take 1 tablet by mouth daily.     Marland Kitchen latanoprost (XALATAN) 0.005 % ophthalmic solution Place 1 drop into the right eye at bedtime.     Marland Kitchen lisinopril (PRINIVIL,ZESTRIL) 20 MG tablet Take 1 tablet by mouth daily as needed (when blood pressure is  high).     . Multiple Vitamins-Minerals (OCUVITE PO) Take 1 tablet by mouth daily.    Marland Kitchen NIFEdipine (PROCARDIA-XL/ADALAT CC) 30 MG 24 hr tablet Take 30 mg by mouth daily.     Marland Kitchen oxybutynin (DITROPAN-XL) 5 MG 24 hr tablet Take 5 mg by mouth daily.    Vladimir Faster Glycol-Propyl Glycol (SYSTANE ULTRA OP) Apply 1 drop to eye 3 (three) times daily.     Marland Kitchen acetaminophen (TYLENOL) 500 MG tablet Take 500 mg by mouth every 6 (six) hours as needed (pain).      Review Of Systems: 12 point ROS negative except as noted above in HPI.  Physical Exam: Filed Vitals:   09/08/14 1930  BP: 153/70  Pulse: 75  Temp: 97.7 F (36.5 C)  Resp: 25    General: alert and cooperative HEENT: PERRLA and extra ocular movement intact Heart: S1, S2 normal, no murmur, rub or gallop, regular rate and rhythm Lungs: clear to auscultation, no wheezes or rales and unlabored breathing Abdomen: + bowel sounds, + mild-moderate lower abd TTP Extremities: extremities normal, atraumatic, no cyanosis or edema Skin:no rashes Neurology: normal without focal findings  Labs and Imaging: Lab Results  Component Value Date/Time   NA 139 09/08/2014 06:36 PM   K 4.7 09/08/2014 06:36 PM   CL 107 09/08/2014 06:36 PM   CO2 25 09/08/2014 06:36 PM   BUN 52* 09/08/2014 06:36 PM   CREATININE 1.99* 09/08/2014 06:36 PM   GLUCOSE 96 09/08/2014 06:36 PM   Lab Results  Component Value  Date   WBC 7.8 09/08/2014   HGB 11.0* 09/08/2014   HCT 33.5* 09/08/2014   MCV 101.2* 09/08/2014   PLT 254 09/08/2014    No results found.         Shanda Howells MD  Pager: [phone]

## 2014-09-08 NOTE — ED Notes (Signed)
Daughter reports pt called her because she has had a couple of loose stools and they have had large blood clots in them.  Reports pain in left side of abd.  Has history of bleeding diverticulum.

## 2014-09-09 DIAGNOSIS — D62 Acute posthemorrhagic anemia: Secondary | ICD-10-CM

## 2014-09-09 LAB — CBC WITH DIFFERENTIAL/PLATELET
BASOS ABS: 0 10*3/uL (ref 0.0–0.1)
BASOS ABS: 0 10*3/uL (ref 0.0–0.1)
Basophils Relative: 0 % (ref 0–1)
Basophils Relative: 0 % (ref 0–1)
EOS ABS: 0.2 10*3/uL (ref 0.0–0.7)
Eosinophils Absolute: 0.2 10*3/uL (ref 0.0–0.7)
Eosinophils Relative: 4 % (ref 0–5)
Eosinophils Relative: 3 % (ref 0–5)
HCT: 28.4 % — ABNORMAL LOW (ref 36.0–46.0)
HCT: 30.5 % — ABNORMAL LOW (ref 36.0–46.0)
Hemoglobin: 9.3 g/dL — ABNORMAL LOW (ref 12.0–15.0)
Hemoglobin: 9.9 g/dL — ABNORMAL LOW (ref 12.0–15.0)
LYMPHS ABS: 1.7 10*3/uL (ref 0.7–4.0)
LYMPHS PCT: 30 % (ref 12–46)
Lymphocytes Relative: 29 % (ref 12–46)
Lymphs Abs: 1.7 10*3/uL (ref 0.7–4.0)
MCH: 32.9 pg (ref 26.0–34.0)
MCH: 32.8 pg (ref 26.0–34.0)
MCHC: 32.7 g/dL (ref 30.0–36.0)
MCHC: 32.5 g/dL (ref 30.0–36.0)
MCV: 100.4 fL — ABNORMAL HIGH (ref 78.0–100.0)
MCV: 101 fL — ABNORMAL HIGH (ref 78.0–100.0)
Monocytes Absolute: 0.7 10*3/uL (ref 0.1–1.0)
Monocytes Absolute: 0.7 10*3/uL (ref 0.1–1.0)
Monocytes Relative: 12 % (ref 3–12)
Monocytes Relative: 12 % (ref 3–12)
NEUTROS ABS: 3.2 10*3/uL (ref 1.7–7.7)
NEUTROS PCT: 54 % (ref 43–77)
Neutro Abs: 3.2 10*3/uL (ref 1.7–7.7)
Neutrophils Relative %: 56 % (ref 43–77)
Platelets: 208 10*3/uL (ref 150–400)
Platelets: 212 10*3/uL (ref 150–400)
RBC: 2.83 MIL/uL — AB (ref 3.87–5.11)
RBC: 3.02 MIL/uL — AB (ref 3.87–5.11)
RDW: 14.5 % (ref 11.5–15.5)
RDW: 14.6 % (ref 11.5–15.5)
WBC: 5.9 10*3/uL (ref 4.0–10.5)
WBC: 5.7 10*3/uL (ref 4.0–10.5)

## 2014-09-09 LAB — COMPREHENSIVE METABOLIC PANEL
ALBUMIN: 3.2 g/dL — AB (ref 3.5–5.2)
ALK PHOS: 60 U/L (ref 39–117)
ALT: 16 U/L (ref 0–35)
AST: 17 U/L (ref 0–37)
Anion gap: 6 (ref 5–15)
BUN: 46 mg/dL — ABNORMAL HIGH (ref 6–23)
CALCIUM: 8.5 mg/dL (ref 8.4–10.5)
CO2: 23 mmol/L (ref 19–32)
Chloride: 111 mmol/L (ref 96–112)
Creatinine, Ser: 1.71 mg/dL — ABNORMAL HIGH (ref 0.50–1.10)
GFR calc Af Amer: 30 mL/min — ABNORMAL LOW (ref >90)
GFR calc non Af Amer: 26 mL/min — ABNORMAL LOW (ref >90)
Glucose, Bld: 86 mg/dL (ref 70–99)
POTASSIUM: 4.2 mmol/L (ref 3.5–5.1)
Sodium: 140 mmol/L (ref 135–145)
TOTAL PROTEIN: 5.8 g/dL — AB (ref 6.0–8.3)
Total Bilirubin: 0.5 mg/dL (ref 0.3–1.2)

## 2014-09-09 LAB — MRSA PCR SCREENING: MRSA by PCR: NEGATIVE

## 2014-09-09 MED ORDER — PANTOPRAZOLE SODIUM 40 MG PO TBEC
40.0000 mg | DELAYED_RELEASE_TABLET | Freq: Every day | ORAL | Status: DC
  Administered 2014-09-10: 40 mg via ORAL
  Filled 2014-09-09: qty 1

## 2014-09-09 NOTE — Consult Note (Signed)
Referring Provider: No ref. provider found Primary Care Physician:  Alonza Bogus, MD Primary Gastroenterologist:  Dr.  Date of Admission:  Date of Consultation:   Reason for Consultation:  Rectal bleed, anemia  HPI:  81 year old female with a PMH significant for diverticulosis, likely diverticular bleed 3 years ago, CKD, HTN. 3 years ago had significant rectal bleeding resulting in syncope for which she was admitted to [facility] and noted diverticulosis on colonoscopy but no definitive source identified. Since then changed her died to reduce carbonated beverages, nuts/seeds, spicy, and fried foods as well as avoiding NSAIDs. Yesterday she had 3 episodes of bright red blood stools with some clots noted, including one episode in the ER. On presentation to the ER her vitals were stable, Hgb 11 at 1830 yesterday. Repeat H/H since then were 9.9/29.7 at 2039 and 9.3/28.4 at 0410 this morning with overall 2 gm hgb drop in less then 12 hours. Denies abdominal pain but admits abdominal fullness to the left side. Denies N/V, dizziness/lightheadedness, syncope, near syncope, chest pain, and dyspnea. Does admit some increased weakness today. Of note the patient is on Diclofenac 50 mg bid per med list. CT abdomen/pelvis yesterday with minimal diverticulosis along distal, transverse, descending, and sigmoid colon; remainder of colon partially filled with stool and grossly unremarkable.  Past Medical History  Diagnosis Date  . Macular degeneration   . Glaucoma   . Precancerous changes of the cervix     lesions  . Rectocele   . Diverticulitis   . Lumbar spine pain   . Pain in joints   . Hearing loss   . HTN (hypertension)     cholesterol  . Lymphedema   . DVT (deep venous thrombosis) 07/14/10    LEA doppler   . SOB (shortness of breath) 04/04/2010    2D echo EF=>55%    Past Surgical History  Procedure Laterality Date  . Salk    . Tracheotomy    . Fallen uterus    . Ovarian cyst  surgery    . Bladder tac    . Hemmorrhoidectomy    . Met osteostomy    . Left total knee replacement  05/02/04    [name]  . Morton's nueroma    . Elevated metatarsal arch    . Rupture rt groin    . Appendix    . Rectocele repair    . Corneal erosion      cataracts   . Colonoscopy  09/20/2011    Procedure: COLONOSCOPY;  Surgeon: Beryle Beams, MD;  Location: [facility] ENDOSCOPY;  Service: Endoscopy;  Laterality: N/A;  . Abdominal hysterectomy      Prior to Admission medications   Medication Sig Start Date End Date Taking? Authorizing Provider  Calcium 600-200 MG-UNIT per tablet Take 1 tablet by mouth daily.     Yes Historical Provider, MD  diclofenac (CATAFLAM) 50 MG tablet Take 1 tablet (50 mg total) by mouth 2 (two) times daily. 07/29/14  Yes Carole Civil, MD  dorzolamide-timolol (COSOPT) 22.3-6.8 MG/ML ophthalmic solution Place 1 drop into both eyes 2 (two) times daily.     Yes Historical Provider, MD  ferrous sulfate 325 (65 FE) MG tablet Take 325 mg by mouth daily with breakfast.   Yes Historical Provider, MD  fluticasone (FLONASE) 50 MCG/ACT nasal spray Place 2 sprays into the nose daily.     Yes Historical Provider, MD  gabapentin (NEURONTIN) 100 MG capsule Take 1 capsule (100 mg total) by mouth  at bedtime. 03/11/14  Yes Carole Civil, MD  glucosamine-chondroitin 500-400 MG tablet Take 1 tablet by mouth daily.    Yes Historical Provider, MD  latanoprost (XALATAN) 0.005 % ophthalmic solution Place 1 drop into the right eye at bedtime.    Yes Historical Provider, MD  lisinopril (PRINIVIL,ZESTRIL) 20 MG tablet Take 1 tablet by mouth daily as needed (when blood pressure is high).  11/03/12  Yes Historical Provider, MD  Multiple Vitamins-Minerals (OCUVITE PO) Take 1 tablet by mouth daily.   Yes Historical Provider, MD  NIFEdipine (PROCARDIA-XL/ADALAT CC) 30 MG 24 hr tablet Take 30 mg by mouth daily.    Yes Historical Provider, MD  oxybutynin (DITROPAN-XL) 5 MG 24 hr tablet Take 5 mg  by mouth daily.   Yes Historical Provider, MD  Polyethyl Glycol-Propyl Glycol (SYSTANE ULTRA OP) Apply 1 drop to eye 3 (three) times daily.    Yes Historical Provider, MD  acetaminophen (TYLENOL) 500 MG tablet Take 500 mg by mouth every 6 (six) hours as needed (pain).     Historical Provider, MD    Current Facility-Administered Medications  Medication Dose Route Frequency Provider Last Rate Last Dose  . 0.9 %  sodium chloride infusion   Intravenous Continuous Deneise Lever, MD 100 mL/hr at 09/08/14 2054    . gabapentin (NEURONTIN) capsule 100 mg  100 mg Oral QHS Deneise Lever, MD   100 mg at 09/08/14 2214  . NIFEdipine (PROCARDIA-XL/ADALAT-CC/NIFEDICAL-XL) 24 hr tablet 30 mg  30 mg Oral Daily Deneise Lever, MD   30 mg at 09/09/14 0900  . ondansetron (ZOFRAN) tablet 4 mg  4 mg Oral Q6H PRN Deneise Lever, MD       Or  . ondansetron [facility]) injection 4 mg  4 mg Intravenous Q6H PRN Deneise Lever, MD      . oxybutynin (DITROPAN-XL) 24 hr tablet 5 mg  5 mg Oral Daily Deneise Lever, MD   5 mg at 09/09/14 0900  . pantoprazole (PROTONIX) EC tablet 40 mg  40 mg Oral Daily Deneise Lever, MD   40 mg at 09/09/14 0900    Allergies as of 09/08/2014 - Review Complete 09/08/2014  Allergen Reaction Noted  . Codeine Nausea And Vomiting   . Hydrocodone Other (See Comments)   . Promethazine hcl    . Tramadol hcl Other (See Comments)     Family History  Problem Relation Age of Onset  . Stroke Mother   . Diabetes Mother   . Heart attack Father   . COPD Paternal Grandfather   . Dementia Sister     History   Social History  . Marital Status: Divorced    Spouse Name: N/A  . Number of Children: N/A  . Years of Education: N/A   Occupational History  . Not on file.   Social History Main Topics  . Smoking status: Never Smoker   . Smokeless tobacco: Not on file  . Alcohol Use: No  . Drug Use: No  . Sexual Activity: Not on file   Other Topics Concern  . Not on file   Social  History Narrative    Review of Systems: Gen: Denies fever, chills, loss of appetite, unintentional weight loss CV: Denies chest pain, heart palpitations, syncope.  Resp: Denies shortness of breath with rest, wheezing GI: See HPI. Denies vomiting blood, jaundice. Admits fecal incontinence with GI bleed episodes yesterday.  MS: Denies joint pain,swelling, cramping Derm: Denies rash, itching, dry skin Psych: Denies  depression, anxiety,confusion, or memory loss Heme: Denies bruising, bleeding, and enlarged lymph nodes.  Physical Exam: Vital signs in last 24 hours: Temp:  [97.5 F (36.4 C)-98.3 F (36.8 C)] 98 F (36.7 C) (03/24 0800) Pulse Rate:  [65-84] 73 (03/24 0600) Resp:  [13-25] 13 (03/24 0600) BP: (101-169)/(44-134) 122/69 mmHg (03/24 0500) SpO2:  [96 %-100 %] 97 % (03/24 0600) Weight:  [131 lb 9.8 oz (59.7 kg)] 131 lb 9.8 oz (59.7 kg) (03/24 0500) Last BM Date: 09/08/14 General:   Alert,  Well-developed, well-nourished, pleasant and cooperative in NAD Head:  Normocephalic and atraumatic. Eyes:  Sclera clear, no icterus. Conjunctiva pink. Ears:  Hard of hearing, states she normally wears hearing aids. Mouth:  No deformity or lesions. Mucosa moist and pink. Neck:  Supple; no masses or thyromegaly. Lungs:  Clear throughout to auscultation.   No wheezes, crackles, or rhonchi. No acute distress. Heart:  Regular rate and rhythm; no murmurs, clicks, rubs,  or gallops. Abdomen:  Soft, nontender and nondistended. No masses, hepatosplenomegaly or hernias noted. Normal bowel sounds, without guarding, and without rebound.   Rectal:  Deferred.   Pulses:  Normal pulses noted. Extremities:  Without clubbing or edema. Neurologic:  Alert and  oriented x4;  grossly normal neurologically. Skin:  Intact without significant lesions or rashes. Cervical Nodes:  No significant cervical adenopathy. Psych:  Alert and cooperative. Normal mood and affect.  Intake/Output from previous day: 03/23  0701 - 03/24 0700 In: -  Out: 200 [Urine:200] Intake/Output this shift:    Lab Results:  Recent Labs  09/08/14 1836 09/08/14 2039 09/09/14 0409  WBC 7.8 7.5 5.9  HGB 11.0* 9.9* 9.3*  HCT 33.5* 29.7* 28.4*  PLT 254 221 208   BMET  Recent Labs  09/08/14 1836 09/09/14 0409  NA 139 140  K 4.7 4.2  CL 107 111  CO2 25 23  GLUCOSE 96 86  BUN 52* 46*  CREATININE 1.99* 1.71*  CALCIUM 9.7 8.5   LFT  Recent Labs  09/08/14 1836 09/09/14 0409  PROT 7.2 5.8*  ALBUMIN 4.0 3.2*  AST 21 17  ALT 18 16  ALKPHOS 68 60  BILITOT 0.5 0.5   PT/INR No results for input(s): LABPROT, INR in the last 72 hours. Hepatitis Panel No results for input(s): HEPBSAG, HCVAB, HEPAIGM, HEPBIGM in the last 72 hours. C-Diff No results for input(s): CDIFFTOX in the last 72 hours.  Studies/Results: Ct Abdomen Pelvis Wo Contrast  09/08/2014   CLINICAL DATA:  Acute onset of bright red rectal bleeding, diarrhea and lower abdominal pain. Initial encounter.  EXAM: CT ABDOMEN AND PELVIS WITHOUT CONTRAST  TECHNIQUE: Multidetector CT imaging of the abdomen and pelvis was performed following the standard protocol without IV contrast.  COMPARISON:  MRI of the lumbar spine performed 06/20/2009  FINDINGS: The visualized lung bases are clear. A small pericardial effusion is thought to remain within normal limits. Scattered coronary artery calcification is noted. Mild calcification is seen at the aortic valve.  The liver and spleen are unremarkable in appearance. The gallbladder is within normal limits. The pancreas and adrenal glands are unremarkable.  Nonspecific bilateral perinephric stranding is noted. The kidneys are otherwise grossly unremarkable. There is no evidence of hydronephrosis. No renal or ureteral stones are seen.  No free fluid is identified. The small bowel is unremarkable in appearance. The stomach is within normal limits. No acute vascular abnormalities are seen. Scattered calcification is noted  along the abdominal aorta and its branches.  The appendix is  not well characterized; there is no evidence of appendicitis. Minimal diverticulosis is noted along the ascending colon, and scattered diverticulosis is noted along the distal transverse, descending and sigmoid colon. Contrast progresses to the level of the mid to distal transverse colon. The remainder of the colon is partially filled with stool and is grossly unremarkable.  The bladder is mildly distended and grossly unremarkable in appearance. The patient is status post hysterectomy. No suspicious adnexal masses are seen. The ovaries are relatively symmetric. No inguinal lymphadenopathy is seen.  No acute osseous abnormalities are identified. Vacuum phenomenon is noted at L3-L4 and L5-S1, without significant disc space narrowing.  IMPRESSION: 1. The source of the patient's bleeding is not well characterized on noncontrast CT. Diverticulosis noted along the distal transverse, descending and sigmoid colon, with minimal diverticulosis along the ascending colon. No evidence of acute diverticulitis at this time. 2. Nonspecific bilateral perinephric stranding may remain within normal limits. 3. Scattered coronary artery calcification seen. Mild calcification at the aortic valve. 4. Scattered calcification along the abdominal aorta and its branches. 5. Minimal degenerative change at the lower lumbar spine.   Electronically Signed   By: Garald Balding M.D.   On: 09/08/2014 23:08    Impression: 81 year old female with moderate to large GI bleed, asymptomatic other then new onset weakness. CT does not show any acute bowel findings. Vitals are stable. 2 gm Hgb drop in less then 12 hours, serial H/H ordered and next due in an hour. Per nursing staff, patient had a formed, soft bowel movement a couple hours ago which did not appear to have any blood. History of diverticulosis and similar presentation 3 years ago with colonoscopy not showing any source of  bleeding. Is prescribed NSAIDs per med list (Diclofenac 50 mg bid). Bleeding seems to have leveled off.  Plan: 1. Continue to monitor closely for recurrent GI bleeding 2. Follow H/H closely, transfuse as needed 3. Supportive measures 4. Avoid any NSAID medications 5. No indications fur urgent colonoscopy now.    Walden Field, AGNP-C Adult & Gerontological Nurse Practitioner [facility]    LOS: 1 day     09/09/2014, 11:05 AM

## 2014-09-09 NOTE — Care Management Utilization Note (Signed)
UR completed 

## 2014-09-09 NOTE — Care Management Note (Addendum)
    Page 1 of 1   09/10/2014     1:15:54 PM CARE MANAGEMENT NOTE 09/10/2014  Patient:  Cindy Schultz, Cindy Schultz   Account Number:  [id number]  Date Initiated:  09/09/2014  Documentation initiated by:  CHILDRESS,JESSICA  Subjective/Objective Assessment:   Pt is from home, lives alone. Pt has no HH services prior to admission. Pt has cane for PRN use but no other DME's. Pt plans to discharge home with self care. Pt says she has had to go to [facility] for rehab in the past. Will cont to follow.     Action/Plan:   Anticipated DC Date:  09/12/2014   Anticipated DC Plan:  HOME/SELF CARE      DC Planning Services  CM consult      Choice offered to / List presented to:             Status of service:  Completed, signed off Medicare Important Message given?  YES (If response is "NO", the following Medicare IM given date fields will be blank) Date Medicare IM given:  09/10/2014 Medicare IM given by:  Jolene Provost Date Additional Medicare IM given:   Additional Medicare IM given by:    Discharge Disposition:  HOME/SELF CARE  Per UR Regulation:  Reviewed for med. necessity/level of care/duration of stay  If discussed at [place] of Stay Meetings, dates discussed:    Comments:  09/10/2014 [place], RN, MSN, CM 09/09/2014 [place], RN, MSN, CM

## 2014-09-10 DIAGNOSIS — D62 Acute posthemorrhagic anemia: Secondary | ICD-10-CM | POA: Diagnosis not present

## 2014-09-10 LAB — COMPREHENSIVE METABOLIC PANEL
ALBUMIN: 3.1 g/dL — AB (ref 3.5–5.2)
ALT: 16 U/L (ref 0–35)
AST: 17 U/L (ref 0–37)
Alkaline Phosphatase: 63 U/L (ref 39–117)
Anion gap: 7 (ref 5–15)
BUN: 36 mg/dL — AB (ref 6–23)
CALCIUM: 8.5 mg/dL (ref 8.4–10.5)
CO2: 21 mmol/L (ref 19–32)
Chloride: 114 mmol/L — ABNORMAL HIGH (ref 96–112)
Creatinine, Ser: 1.62 mg/dL — ABNORMAL HIGH (ref 0.50–1.10)
GFR calc non Af Amer: 27 mL/min — ABNORMAL LOW (ref >90)
GFR, EST AFRICAN AMERICAN: 32 mL/min — AB (ref >90)
GLUCOSE: 86 mg/dL (ref 70–99)
Potassium: 4.5 mmol/L (ref 3.5–5.1)
SODIUM: 142 mmol/L (ref 135–145)
Total Bilirubin: 0.3 mg/dL (ref 0.3–1.2)
Total Protein: 5.9 g/dL — ABNORMAL LOW (ref 6.0–8.3)

## 2014-09-10 LAB — CBC WITH DIFFERENTIAL/PLATELET
BASOS PCT: 0 % (ref 0–1)
Basophils Absolute: 0 10*3/uL (ref 0.0–0.1)
EOS PCT: 4 % (ref 0–5)
Eosinophils Absolute: 0.2 10*3/uL (ref 0.0–0.7)
HCT: 30.5 % — ABNORMAL LOW (ref 36.0–46.0)
HEMOGLOBIN: 9.9 g/dL — AB (ref 12.0–15.0)
LYMPHS ABS: 1.6 10*3/uL (ref 0.7–4.0)
Lymphocytes Relative: 32 % (ref 12–46)
MCH: 32.6 pg (ref 26.0–34.0)
MCHC: 32.5 g/dL (ref 30.0–36.0)
MCV: 100.3 fL — AB (ref 78.0–100.0)
MONO ABS: 0.7 10*3/uL (ref 0.1–1.0)
MONOS PCT: 13 % — AB (ref 3–12)
NEUTROS PCT: 51 % (ref 43–77)
Neutro Abs: 2.6 10*3/uL (ref 1.7–7.7)
Platelets: 217 10*3/uL (ref 150–400)
RBC: 3.04 MIL/uL — AB (ref 3.87–5.11)
RDW: 14.7 % (ref 11.5–15.5)
WBC: 5.1 10*3/uL (ref 4.0–10.5)

## 2014-09-10 MED ORDER — LATANOPROST 0.005 % OP SOLN
1.0000 [drp] | Freq: Every day | OPHTHALMIC | Status: DC
  Administered 2014-09-10: 1 [drp] via OPHTHALMIC
  Filled 2014-09-10: qty 2.5

## 2014-09-10 MED ORDER — DORZOLAMIDE HCL-TIMOLOL MAL 2-0.5 % OP SOLN
1.0000 [drp] | Freq: Two times a day (BID) | OPHTHALMIC | Status: DC
  Administered 2014-09-10 – 2014-09-11 (×2): 1 [drp] via OPHTHALMIC
  Filled 2014-09-10: qty 10

## 2014-09-10 MED ORDER — FLUTICASONE PROPIONATE 50 MCG/ACT NA SUSP
2.0000 | Freq: Every day | NASAL | Status: DC
  Administered 2014-09-10 – 2014-09-11 (×2): 2 via NASAL
  Filled 2014-09-10: qty 16

## 2014-09-10 MED ORDER — FERROUS SULFATE 325 (65 FE) MG PO TABS
325.0000 mg | ORAL_TABLET | Freq: Every day | ORAL | Status: DC
  Administered 2014-09-11: 325 mg via ORAL
  Filled 2014-09-10: qty 1

## 2014-09-10 NOTE — Progress Notes (Signed)
Subjective: She says she feels much better. No further bleeding has been noted. Her hemoglobin level is stable. She has no other new complaints.  Objective: Vital signs in last 24 hours: Temp:  [97.6 F (36.4 C)-98.1 F (36.7 C)] 98.1 F (36.7 C) (03/25 0741) Pulse Rate:  [62-78] 65 (03/25 0600) Resp:  [11-24] 12 (03/25 0600) BP: (70-137)/(43-78) 133/65 mmHg (03/25 0938) SpO2:  [96 %-100 %] 97 % (03/25 0600) Weight:  [59.7 kg (131 lb 9.8 oz)] 59.7 kg (131 lb 9.8 oz) (03/25 0500) Weight change: 0 kg (0 lb) Last BM Date: 09/09/14  Intake/Output from previous day: 03/24 0701 - 03/25 0700 In: 2887.5 [I.V.:2887.5] Out: 1100 [Urine:1100]  PHYSICAL EXAM General appearance: alert, cooperative and no distress Resp: clear to auscultation bilaterally Cardio: regular rate and rhythm, S1, S2 normal, no murmur, click, rub or gallop GI: soft, non-tender; bowel sounds normal; no masses,  no organomegaly Extremities: extremities normal, atraumatic, no cyanosis or edema  Lab Results:  Results for orders placed or performed during the hospital encounter of 09/08/14 (from the past 48 hour(s))  Comprehensive metabolic panel     Status: Abnormal   Collection Time: 09/08/14  6:36 PM  Result Value Ref Range   Sodium 139 135 - 145 mmol/L   Potassium 4.7 3.5 - 5.1 mmol/L   Chloride 107 96 - 112 mmol/L   CO2 25 19 - 32 mmol/L   Glucose, Bld 96 70 - 99 mg/dL   BUN 52 (H) 6 - 23 mg/dL   Creatinine, Ser 1.99 (H) 0.50 - 1.10 mg/dL   Calcium 9.7 8.4 - 10.5 mg/dL   Total Protein 7.2 6.0 - 8.3 g/dL   Albumin 4.0 3.5 - 5.2 g/dL   AST 21 0 - 37 U/L   ALT 18 0 - 35 U/L   Alkaline Phosphatase 68 39 - 117 U/L   Total Bilirubin 0.5 0.3 - 1.2 mg/dL   GFR calc non Af Amer 21 (L) >90 mL/min   GFR calc Af Amer 25 (L) >90 mL/min    Comment: (NOTE) The eGFR has been calculated using the CKD EPI equation. This calculation has not been validated in all clinical situations. eGFR's persistently <90 mL/min  signify possible Chronic Kidney Disease.    Anion gap 7 5 - 15  Lipase, blood     Status: None   Collection Time: 09/08/14  6:36 PM  Result Value Ref Range   Lipase 42 11 - 59 U/L  CBC with Differential     Status: Abnormal   Collection Time: 09/08/14  6:36 PM  Result Value Ref Range   WBC 7.8 4.0 - 10.5 K/uL   RBC 3.31 (L) 3.87 - 5.11 MIL/uL   Hemoglobin 11.0 (L) 12.0 - 15.0 g/dL   HCT 33.5 (L) 36.0 - 46.0 %   MCV 101.2 (H) 78.0 - 100.0 fL   MCH 33.2 26.0 - 34.0 pg   MCHC 32.8 30.0 - 36.0 g/dL   RDW 14.5 11.5 - 15.5 %   Platelets 254 150 - 400 K/uL   Neutrophils Relative % 60 43 - 77 %   Neutro Abs 4.6 1.7 - 7.7 K/uL   Lymphocytes Relative 26 12 - 46 %   Lymphs Abs 2.1 0.7 - 4.0 K/uL   Monocytes Relative 12 3 - 12 %   Monocytes Absolute 1.0 0.1 - 1.0 K/uL   Eosinophils Relative 2 0 - 5 %   Eosinophils Absolute 0.2 0.0 - 0.7 K/uL   Basophils Relative 0  0 - 1 %   Basophils Absolute 0.0 0.0 - 0.1 K/uL  Type and screen     Status: None   Collection Time: 09/08/14  6:36 PM  Result Value Ref Range   ABO/RH(D) A POS    Antibody Screen NEG    Sample Expiration 09/11/2014   I-Stat CG4 Lactic Acid, ED     Status: None   Collection Time: 09/08/14  6:41 PM  Result Value Ref Range   Lactic Acid, Venous 0.59 0.5 - 2.0 mmol/L  CBC WITH DIFFERENTIAL     Status: Abnormal   Collection Time: 09/08/14  8:39 PM  Result Value Ref Range   WBC 7.5 4.0 - 10.5 K/uL   RBC 2.96 (L) 3.87 - 5.11 MIL/uL   Hemoglobin 9.9 (L) 12.0 - 15.0 g/dL   HCT 29.7 (L) 36.0 - 46.0 %   MCV 100.3 (H) 78.0 - 100.0 fL   MCH 33.4 26.0 - 34.0 pg   MCHC 33.3 30.0 - 36.0 g/dL   RDW 14.4 11.5 - 15.5 %   Platelets 221 150 - 400 K/uL   Neutrophils Relative % 60 43 - 77 %   Neutro Abs 4.4 1.7 - 7.7 K/uL   Lymphocytes Relative 25 12 - 46 %   Lymphs Abs 1.9 0.7 - 4.0 K/uL   Monocytes Relative 12 3 - 12 %   Monocytes Absolute 0.9 0.1 - 1.0 K/uL   Eosinophils Relative 3 0 - 5 %   Eosinophils Absolute 0.2 0.0 - 0.7  K/uL   Basophils Relative 0 0 - 1 %   Basophils Absolute 0.0 0.0 - 0.1 K/uL  MRSA PCR Screening     Status: None   Collection Time: 09/08/14 10:50 PM  Result Value Ref Range   MRSA by PCR NEGATIVE NEGATIVE    Comment:        The GeneXpert MRSA Assay (FDA approved for NASAL specimens only), is one component of a comprehensive MRSA colonization surveillance program. It is not intended to diagnose MRSA infection nor to guide or monitor treatment for MRSA infections.   Comprehensive metabolic panel     Status: Abnormal   Collection Time: 09/09/14  4:09 AM  Result Value Ref Range   Sodium 140 135 - 145 mmol/L   Potassium 4.2 3.5 - 5.1 mmol/L   Chloride 111 96 - 112 mmol/L   CO2 23 19 - 32 mmol/L   Glucose, Bld 86 70 - 99 mg/dL   BUN 46 (H) 6 - 23 mg/dL   Creatinine, Ser 1.71 (H) 0.50 - 1.10 mg/dL   Calcium 8.5 8.4 - 10.5 mg/dL   Total Protein 5.8 (L) 6.0 - 8.3 g/dL   Albumin 3.2 (L) 3.5 - 5.2 g/dL   AST 17 0 - 37 U/L   ALT 16 0 - 35 U/L   Alkaline Phosphatase 60 39 - 117 U/L   Total Bilirubin 0.5 0.3 - 1.2 mg/dL   GFR calc non Af Amer 26 (L) >90 mL/min   GFR calc Af Amer 30 (L) >90 mL/min    Comment: (NOTE) The eGFR has been calculated using the CKD EPI equation. This calculation has not been validated in all clinical situations. eGFR's persistently <90 mL/min signify possible Chronic Kidney Disease.    Anion gap 6 5 - 15  CBC WITH DIFFERENTIAL     Status: Abnormal   Collection Time: 09/09/14  4:09 AM  Result Value Ref Range   WBC 5.9 4.0 - 10.5 K/uL   RBC  2.83 (L) 3.87 - 5.11 MIL/uL   Hemoglobin 9.3 (L) 12.0 - 15.0 g/dL   HCT 28.4 (L) 36.0 - 46.0 %   MCV 100.4 (H) 78.0 - 100.0 fL   MCH 32.9 26.0 - 34.0 pg   MCHC 32.7 30.0 - 36.0 g/dL   RDW 14.5 11.5 - 15.5 %   Platelets 208 150 - 400 K/uL   Neutrophils Relative % 54 43 - 77 %   Neutro Abs 3.2 1.7 - 7.7 K/uL   Lymphocytes Relative 30 12 - 46 %   Lymphs Abs 1.7 0.7 - 4.0 K/uL   Monocytes Relative 12 3 - 12 %    Monocytes Absolute 0.7 0.1 - 1.0 K/uL   Eosinophils Relative 4 0 - 5 %   Eosinophils Absolute 0.2 0.0 - 0.7 K/uL   Basophils Relative 0 0 - 1 %   Basophils Absolute 0.0 0.0 - 0.1 K/uL  CBC WITH DIFFERENTIAL     Status: Abnormal   Collection Time: 09/09/14 12:10 PM  Result Value Ref Range   WBC 5.7 4.0 - 10.5 K/uL   RBC 3.02 (L) 3.87 - 5.11 MIL/uL   Hemoglobin 9.9 (L) 12.0 - 15.0 g/dL   HCT 30.5 (L) 36.0 - 46.0 %   MCV 101.0 (H) 78.0 - 100.0 fL   MCH 32.8 26.0 - 34.0 pg   MCHC 32.5 30.0 - 36.0 g/dL   RDW 14.6 11.5 - 15.5 %   Platelets 212 150 - 400 K/uL   Neutrophils Relative % 56 43 - 77 %   Neutro Abs 3.2 1.7 - 7.7 K/uL   Lymphocytes Relative 29 12 - 46 %   Lymphs Abs 1.7 0.7 - 4.0 K/uL   Monocytes Relative 12 3 - 12 %   Monocytes Absolute 0.7 0.1 - 1.0 K/uL   Eosinophils Relative 3 0 - 5 %   Eosinophils Absolute 0.2 0.0 - 0.7 K/uL   Basophils Relative 0 0 - 1 %   Basophils Absolute 0.0 0.0 - 0.1 K/uL  Comprehensive metabolic panel     Status: Abnormal   Collection Time: 09/10/14  5:34 AM  Result Value Ref Range   Sodium 142 135 - 145 mmol/L   Potassium 4.5 3.5 - 5.1 mmol/L   Chloride 114 (H) 96 - 112 mmol/L   CO2 21 19 - 32 mmol/L   Glucose, Bld 86 70 - 99 mg/dL   BUN 36 (H) 6 - 23 mg/dL   Creatinine, Ser 1.62 (H) 0.50 - 1.10 mg/dL   Calcium 8.5 8.4 - 10.5 mg/dL   Total Protein 5.9 (L) 6.0 - 8.3 g/dL   Albumin 3.1 (L) 3.5 - 5.2 g/dL   AST 17 0 - 37 U/L   ALT 16 0 - 35 U/L   Alkaline Phosphatase 63 39 - 117 U/L   Total Bilirubin 0.3 0.3 - 1.2 mg/dL   GFR calc non Af Amer 27 (L) >90 mL/min   GFR calc Af Amer 32 (L) >90 mL/min    Comment: (NOTE) The eGFR has been calculated using the CKD EPI equation. This calculation has not been validated in all clinical situations. eGFR's persistently <90 mL/min signify possible Chronic Kidney Disease.    Anion gap 7 5 - 15  CBC with Differential/Platelet     Status: Abnormal   Collection Time: 09/10/14  5:34 AM  Result Value  Ref Range   WBC 5.1 4.0 - 10.5 K/uL   RBC 3.04 (L) 3.87 - 5.11 MIL/uL   Hemoglobin 9.9 (  L) 12.0 - 15.0 g/dL   HCT 30.5 (L) 36.0 - 46.0 %   MCV 100.3 (H) 78.0 - 100.0 fL   MCH 32.6 26.0 - 34.0 pg   MCHC 32.5 30.0 - 36.0 g/dL   RDW 14.7 11.5 - 15.5 %   Platelets 217 150 - 400 K/uL   Neutrophils Relative % 51 43 - 77 %   Neutro Abs 2.6 1.7 - 7.7 K/uL   Lymphocytes Relative 32 12 - 46 %   Lymphs Abs 1.6 0.7 - 4.0 K/uL   Monocytes Relative 13 (H) 3 - 12 %   Monocytes Absolute 0.7 0.1 - 1.0 K/uL   Eosinophils Relative 4 0 - 5 %   Eosinophils Absolute 0.2 0.0 - 0.7 K/uL   Basophils Relative 0 0 - 1 %   Basophils Absolute 0.0 0.0 - 0.1 K/uL    ABGS No results for input(s): PHART, PO2ART, TCO2, HCO3 in the last 72 hours.  Invalid input(s): PCO2 CULTURES Recent Results (from the past 240 hour(s))  MRSA PCR Screening     Status: None   Collection Time: 09/08/14 10:50 PM  Result Value Ref Range Status   MRSA by PCR NEGATIVE NEGATIVE Final    Comment:        The GeneXpert MRSA Assay (FDA approved for NASAL specimens only), is one component of a comprehensive MRSA colonization surveillance program. It is not intended to diagnose MRSA infection nor to guide or monitor treatment for MRSA infections.    Studies/Results: Ct Abdomen Pelvis Wo Contrast  09/08/2014   CLINICAL DATA:  Acute onset of bright red rectal bleeding, diarrhea and lower abdominal pain. Initial encounter.  EXAM: CT ABDOMEN AND PELVIS WITHOUT CONTRAST  TECHNIQUE: Multidetector CT imaging of the abdomen and pelvis was performed following the standard protocol without IV contrast.  COMPARISON:  MRI of the lumbar spine performed 06/20/2009  FINDINGS: The visualized lung bases are clear. A small pericardial effusion is thought to remain within normal limits. Scattered coronary artery calcification is noted. Mild calcification is seen at the aortic valve.  The liver and spleen are unremarkable in appearance. The gallbladder  is within normal limits. The pancreas and adrenal glands are unremarkable.  Nonspecific bilateral perinephric stranding is noted. The kidneys are otherwise grossly unremarkable. There is no evidence of hydronephrosis. No renal or ureteral stones are seen.  No free fluid is identified. The small bowel is unremarkable in appearance. The stomach is within normal limits. No acute vascular abnormalities are seen. Scattered calcification is noted along the abdominal aorta and its branches.  The appendix is not well characterized; there is no evidence of appendicitis. Minimal diverticulosis is noted along the ascending colon, and scattered diverticulosis is noted along the distal transverse, descending and sigmoid colon. Contrast progresses to the level of the mid to distal transverse colon. The remainder of the colon is partially filled with stool and is grossly unremarkable.  The bladder is mildly distended and grossly unremarkable in appearance. The patient is status post hysterectomy. No suspicious adnexal masses are seen. The ovaries are relatively symmetric. No inguinal lymphadenopathy is seen.  No acute osseous abnormalities are identified. Vacuum phenomenon is noted at L3-L4 and L5-S1, without significant disc space narrowing.  IMPRESSION: 1. The source of the patient's bleeding is not well characterized on noncontrast CT. Diverticulosis noted along the distal transverse, descending and sigmoid colon, with minimal diverticulosis along the ascending colon. No evidence of acute diverticulitis at this time. 2. Nonspecific bilateral perinephric stranding may  remain within normal limits. 3. Scattered coronary artery calcification seen. Mild calcification at the aortic valve. 4. Scattered calcification along the abdominal aorta and its branches. 5. Minimal degenerative change at the lower lumbar spine.   Electronically Signed   By: Garald Balding M.D.   On: 09/08/2014 23:08    Medications:  Prior to Admission:   Prescriptions prior to admission  Medication Sig Dispense Refill Last Dose  . Calcium 600-200 MG-UNIT per tablet Take 1 tablet by mouth daily.     09/08/2014 at Unknown time  . diclofenac (CATAFLAM) 50 MG tablet Take 1 tablet (50 mg total) by mouth 2 (two) times daily. 60 tablet 5 09/08/2014 at Unknown time  . dorzolamide-timolol (COSOPT) 22.3-6.8 MG/ML ophthalmic solution Place 1 drop into both eyes 2 (two) times daily.     09/08/2014 at Unknown time  . ferrous sulfate 325 (65 FE) MG tablet Take 325 mg by mouth daily with breakfast.   09/08/2014 at Unknown time  . fluticasone (FLONASE) 50 MCG/ACT nasal spray Place 2 sprays into the nose daily.     09/08/2014 at Unknown time  . gabapentin (NEURONTIN) 100 MG capsule Take 1 capsule (100 mg total) by mouth at bedtime. 30 capsule 5 09/07/2014 at Unknown time  . glucosamine-chondroitin 500-400 MG tablet Take 1 tablet by mouth daily.    09/08/2014 at Unknown time  . latanoprost (XALATAN) 0.005 % ophthalmic solution Place 1 drop into the right eye at bedtime.    09/07/2014 at Unknown time  . lisinopril (PRINIVIL,ZESTRIL) 20 MG tablet Take 1 tablet by mouth daily as needed (when blood pressure is high).    Past Week at Unknown time  . Multiple Vitamins-Minerals (OCUVITE PO) Take 1 tablet by mouth daily.   09/08/2014 at Unknown time  . NIFEdipine (PROCARDIA-XL/ADALAT CC) 30 MG 24 hr tablet Take 30 mg by mouth daily.    09/08/2014 at Unknown time  . oxybutynin (DITROPAN-XL) 5 MG 24 hr tablet Take 5 mg by mouth daily.   09/08/2014 at Unknown time  . Polyethyl Glycol-Propyl Glycol (SYSTANE ULTRA OP) Apply 1 drop to eye 3 (three) times daily.    Past Week at Unknown time  . acetaminophen (TYLENOL) 500 MG tablet Take 500 mg by mouth every 6 (six) hours as needed (pain).    unknown   Scheduled: . gabapentin  100 mg Oral QHS  . NIFEdipine  30 mg Oral Daily  . oxybutynin  5 mg Oral Daily  . pantoprazole  40 mg Oral QAC supper   Continuous: . sodium chloride 75 mL/hr  at 09/10/14 0511   [id number] **OR** ondansetron (ZOFRAN) IV  Assesment: She was admitted with GI bleeding presumably from diverticuli. Although she dropped her hemoglobin by about 2 g she has stabilized and I don't think she is going to need any blood. She's okay to move from the stepdown unit. GI consult is noted and appreciated Active Problems:   GIB (gastrointestinal bleeding)    Plan: Transfer out of the stepdown unit repeat CBC in the morning likely discharge tomorrow    LOS: 2 days   Icess Bertoni L 09/10/2014, 9:52 AM

## 2014-09-10 NOTE — Progress Notes (Addendum)
Patient ID: Cindy Schultz, female   DOB: 1924/01/24, 81 y.o.   MRN: [id number]   Assessment/Plan: ADMITTED WITH LGIB. SYMPTOMS RESOLVED. HB STABLE.  PLAN: 1. SUPPORTIVE CARE 2. NO INDICATION FOR ENDOSCOPY AT THIS TIME. 3. D/C HOME MAR 26 IF NO BRBPR OR MELENA AND Hb STABLE. 4. NUR TO ASSUME CARE MAR 26-27  ADDENDUM 1300: ATTEMPTED TO CONTACT DAUGHTER(JOYCE) TO DISCUSS PLA-NO VM SETUP.    Subjective: Since I last evaluated the patient SHE REPORTS NO BRBPR. NURSE SAW SML AMOUNT OF BLACK STOOL BUT BM BROWN. No questions or concerns.   Objective: Vital signs in last 24 hours: Filed Vitals:   09/10/14 0938  BP: 133/65  Pulse:   Temp:   Resp:      General appearance: alert, cooperative and no distress Resp: clear to auscultation bilaterally Cardio: regular rate and rhythm GI: soft, non-tender; bowel sounds normal; no masses,  no organomegaly  Lab Results:  Hb 9.9 MCV 100.3 Cr 1.962   Studies/Results: No results found.  Medications: I have reviewed the patient's current medications.   LOS: 5 days   Barney Drain 11/26/2013, 2:23 PM

## 2014-09-10 NOTE — Progress Notes (Signed)
Patient resting in bed. Voice no c/o pain at this time.

## 2014-09-10 NOTE — Progress Notes (Signed)
Patient transferred to room 311. Report given to Abundio Miu RN. Vital signs stable at transfer.

## 2014-09-11 LAB — CBC WITH DIFFERENTIAL/PLATELET
BASOS PCT: 0 % (ref 0–1)
Basophils Absolute: 0 10*3/uL (ref 0.0–0.1)
EOS PCT: 3 % (ref 0–5)
Eosinophils Absolute: 0.2 10*3/uL (ref 0.0–0.7)
HEMATOCRIT: 29.6 % — AB (ref 36.0–46.0)
Hemoglobin: 9.8 g/dL — ABNORMAL LOW (ref 12.0–15.0)
LYMPHS PCT: 29 % (ref 12–46)
Lymphs Abs: 1.8 10*3/uL (ref 0.7–4.0)
MCH: 33.6 pg (ref 26.0–34.0)
MCHC: 33.1 g/dL (ref 30.0–36.0)
MCV: 101.4 fL — ABNORMAL HIGH (ref 78.0–100.0)
Monocytes Absolute: 0.8 10*3/uL (ref 0.1–1.0)
Monocytes Relative: 13 % — ABNORMAL HIGH (ref 3–12)
NEUTROS ABS: 3.3 10*3/uL (ref 1.7–7.7)
NEUTROS PCT: 55 % (ref 43–77)
Platelets: 202 10*3/uL (ref 150–400)
RBC: 2.92 MIL/uL — AB (ref 3.87–5.11)
RDW: 14.6 % (ref 11.5–15.5)
WBC: 6.1 10*3/uL (ref 4.0–10.5)

## 2014-09-11 NOTE — Progress Notes (Signed)
Subjective: She says she feels well and wants to go home. She has no new complaints.  Objective: Vital signs in last 24 hours: Temp:  [98.4 F (36.9 C)-98.7 F (37.1 C)] 98.4 F (36.9 C) (03/26 0651) Pulse Rate:  [63-69] 69 (03/26 0651) Resp:  [16] 16 (03/26 0651) BP: (128-142)/(54-69) 130/54 mmHg (03/26 0651) SpO2:  [90 %-100 %] 100 % (03/26 0651) Weight:  [58.514 kg (129 lb)] 58.514 kg (129 lb) (03/26 0651) Weight change: -1.186 kg (-2 lb 9.8 oz) Last BM Date: 09/10/14  Intake/Output from previous day: 03/25 0701 - 03/26 0700 In: 240 [P.O.:240] Out: -   PHYSICAL EXAM General appearance: alert, cooperative and no distress Resp: clear to auscultation bilaterally Cardio: regular rate and rhythm, S1, S2 normal, no murmur, click, rub or gallop GI: soft, non-tender; bowel sounds normal; no masses,  no organomegaly Extremities: extremities normal, atraumatic, no cyanosis or edema  Lab Results:  Results for orders placed or performed during the hospital encounter of 09/08/14 (from the past 48 hour(s))  CBC WITH DIFFERENTIAL     Status: Abnormal   Collection Time: 09/09/14 12:10 PM  Result Value Ref Range   WBC 5.7 4.0 - 10.5 K/uL   RBC 3.02 (L) 3.87 - 5.11 MIL/uL   Hemoglobin 9.9 (L) 12.0 - 15.0 g/dL   HCT 30.5 (L) 36.0 - 46.0 %   MCV 101.0 (H) 78.0 - 100.0 fL   MCH 32.8 26.0 - 34.0 pg   MCHC 32.5 30.0 - 36.0 g/dL   RDW 14.6 11.5 - 15.5 %   Platelets 212 150 - 400 K/uL   Neutrophils Relative % 56 43 - 77 %   Neutro Abs 3.2 1.7 - 7.7 K/uL   Lymphocytes Relative 29 12 - 46 %   Lymphs Abs 1.7 0.7 - 4.0 K/uL   Monocytes Relative 12 3 - 12 %   Monocytes Absolute 0.7 0.1 - 1.0 K/uL   Eosinophils Relative 3 0 - 5 %   Eosinophils Absolute 0.2 0.0 - 0.7 K/uL   Basophils Relative 0 0 - 1 %   Basophils Absolute 0.0 0.0 - 0.1 K/uL  Comprehensive metabolic panel     Status: Abnormal   Collection Time: 09/10/14  5:34 AM  Result Value Ref Range   Sodium 142 135 - 145 mmol/L   Potassium 4.5 3.5 - 5.1 mmol/L   Chloride 114 (H) 96 - 112 mmol/L   CO2 21 19 - 32 mmol/L   Glucose, Bld 86 70 - 99 mg/dL   BUN 36 (H) 6 - 23 mg/dL   Creatinine, Ser 1.62 (H) 0.50 - 1.10 mg/dL   Calcium 8.5 8.4 - 10.5 mg/dL   Total Protein 5.9 (L) 6.0 - 8.3 g/dL   Albumin 3.1 (L) 3.5 - 5.2 g/dL   AST 17 0 - 37 U/L   ALT 16 0 - 35 U/L   Alkaline Phosphatase 63 39 - 117 U/L   Total Bilirubin 0.3 0.3 - 1.2 mg/dL   GFR calc non Af Amer 27 (L) >90 mL/min   GFR calc Af Amer 32 (L) >90 mL/min    Comment: (NOTE) The eGFR has been calculated using the CKD EPI equation. This calculation has not been validated in all clinical situations. eGFR's persistently <90 mL/min signify possible Chronic Kidney Disease.    Anion gap 7 5 - 15  CBC with Differential/Platelet     Status: Abnormal   Collection Time: 09/10/14  5:34 AM  Result Value Ref Range   WBC  5.1 4.0 - 10.5 K/uL   RBC 3.04 (L) 3.87 - 5.11 MIL/uL   Hemoglobin 9.9 (L) 12.0 - 15.0 g/dL   HCT 30.5 (L) 36.0 - 46.0 %   MCV 100.3 (H) 78.0 - 100.0 fL   MCH 32.6 26.0 - 34.0 pg   MCHC 32.5 30.0 - 36.0 g/dL   RDW 14.7 11.5 - 15.5 %   Platelets 217 150 - 400 K/uL   Neutrophils Relative % 51 43 - 77 %   Neutro Abs 2.6 1.7 - 7.7 K/uL   Lymphocytes Relative 32 12 - 46 %   Lymphs Abs 1.6 0.7 - 4.0 K/uL   Monocytes Relative 13 (H) 3 - 12 %   Monocytes Absolute 0.7 0.1 - 1.0 K/uL   Eosinophils Relative 4 0 - 5 %   Eosinophils Absolute 0.2 0.0 - 0.7 K/uL   Basophils Relative 0 0 - 1 %   Basophils Absolute 0.0 0.0 - 0.1 K/uL  CBC with Differential/Platelet     Status: Abnormal   Collection Time: 09/11/14  6:56 AM  Result Value Ref Range   WBC 6.1 4.0 - 10.5 K/uL   RBC 2.92 (L) 3.87 - 5.11 MIL/uL   Hemoglobin 9.8 (L) 12.0 - 15.0 g/dL   HCT 29.6 (L) 36.0 - 46.0 %   MCV 101.4 (H) 78.0 - 100.0 fL   MCH 33.6 26.0 - 34.0 pg   MCHC 33.1 30.0 - 36.0 g/dL   RDW 14.6 11.5 - 15.5 %   Platelets 202 150 - 400 K/uL   Neutrophils Relative % 55 43 - 77  %   Neutro Abs 3.3 1.7 - 7.7 K/uL   Lymphocytes Relative 29 12 - 46 %   Lymphs Abs 1.8 0.7 - 4.0 K/uL   Monocytes Relative 13 (H) 3 - 12 %   Monocytes Absolute 0.8 0.1 - 1.0 K/uL   Eosinophils Relative 3 0 - 5 %   Eosinophils Absolute 0.2 0.0 - 0.7 K/uL   Basophils Relative 0 0 - 1 %   Basophils Absolute 0.0 0.0 - 0.1 K/uL    ABGS No results for input(s): PHART, PO2ART, TCO2, HCO3 in the last 72 hours.  Invalid input(s): PCO2 CULTURES Recent Results (from the past 240 hour(s))  MRSA PCR Screening     Status: None   Collection Time: 09/08/14 10:50 PM  Result Value Ref Range Status   MRSA by PCR NEGATIVE NEGATIVE Final    Comment:        The GeneXpert MRSA Assay (FDA approved for NASAL specimens only), is one component of a comprehensive MRSA colonization surveillance program. It is not intended to diagnose MRSA infection nor to guide or monitor treatment for MRSA infections.    Studies/Results: No results found.  Medications:  Prior to Admission:  Prescriptions prior to admission  Medication Sig Dispense Refill Last Dose  . Calcium 600-200 MG-UNIT per tablet Take 1 tablet by mouth daily.     09/08/2014 at Unknown time  . diclofenac (CATAFLAM) 50 MG tablet Take 1 tablet (50 mg total) by mouth 2 (two) times daily. 60 tablet 5 09/08/2014 at Unknown time  . dorzolamide-timolol (COSOPT) 22.3-6.8 MG/ML ophthalmic solution Place 1 drop into both eyes 2 (two) times daily.     09/08/2014 at Unknown time  . ferrous sulfate 325 (65 FE) MG tablet Take 325 mg by mouth daily with breakfast.   09/08/2014 at Unknown time  . fluticasone (FLONASE) 50 MCG/ACT nasal spray Place 2 sprays into the nose  daily.     09/08/2014 at Unknown time  . gabapentin (NEURONTIN) 100 MG capsule Take 1 capsule (100 mg total) by mouth at bedtime. 30 capsule 5 09/07/2014 at Unknown time  . glucosamine-chondroitin 500-400 MG tablet Take 1 tablet by mouth daily.    09/08/2014 at Unknown time  . latanoprost (XALATAN)  0.005 % ophthalmic solution Place 1 drop into the right eye at bedtime.    09/07/2014 at Unknown time  . lisinopril (PRINIVIL,ZESTRIL) 20 MG tablet Take 1 tablet by mouth daily as needed (when blood pressure is high).    Past Week at Unknown time  . Multiple Vitamins-Minerals (OCUVITE PO) Take 1 tablet by mouth daily.   09/08/2014 at Unknown time  . NIFEdipine (PROCARDIA-XL/ADALAT CC) 30 MG 24 hr tablet Take 30 mg by mouth daily.    09/08/2014 at Unknown time  . oxybutynin (DITROPAN-XL) 5 MG 24 hr tablet Take 5 mg by mouth daily.   09/08/2014 at Unknown time  . Polyethyl Glycol-Propyl Glycol (SYSTANE ULTRA OP) Apply 1 drop to eye 3 (three) times daily.    Past Week at Unknown time  . acetaminophen (TYLENOL) 500 MG tablet Take 500 mg by mouth every 6 (six) hours as needed (pain).    unknown   Scheduled: . dorzolamide-timolol  1 drop Both Eyes BID  . ferrous sulfate  325 mg Oral Q breakfast  . fluticasone  2 spray Each Nare Daily  . gabapentin  100 mg Oral QHS  . latanoprost  1 drop Right Eye QHS  . NIFEdipine  30 mg Oral Daily  . oxybutynin  5 mg Oral Daily  . pantoprazole  40 mg Oral QAC supper   Continuous: . sodium chloride 75 mL/hr at 09/10/14 0511   [id number] **OR** ondansetron (ZOFRAN) IV  Assesment: She was admitted with GI bleeding and acute blood loss anemia. This has stabilized. Her hemoglobin level is stable now. She is ready for discharge Active Problems:   LOW BACK PAIN   HTN (hypertension)   OA (osteoarthritis) of knee   GIB (gastrointestinal bleeding)   Acute blood loss anemia    Plan: Discharge home    LOS: 3 days   Cindy Schultz L 09/11/2014, 7:50 AM

## 2014-09-11 NOTE — Progress Notes (Signed)
Patient is stable at this time, patient being discharged to home. Medication list reviewed, including next dose due, purpose, actions, and side effects. Reviewed discharge instructions, d/c instruction form signed. Understanding voiced to instructions. Educated on follow up MD appointments. Patient's daughter to transport patient to home.

## 2014-09-12 NOTE — Discharge Summary (Addendum)
Physician Discharge Summary  Patient ID: Cindy Schultz MRN: [id number] DOB/AGE: [id number] 79 y.o. Primary Care Physician:Abdallah Hern L, MD Admit date: 09/08/2014 Discharge date: 09/11/2014    Discharge Diagnoses:   Active Problems:   LOW BACK PAIN   HTN (hypertension)   OA (osteoarthritis) of knee   GIB (gastrointestinal bleeding)   Acute blood loss anemia     Medication List    STOP taking these medications        diclofenac 50 MG tablet  Commonly known as:  CATAFLAM      TAKE these medications        acetaminophen 500 MG tablet  Commonly known as:  TYLENOL  Take 500 mg by mouth every 6 (six) hours as needed (pain).     Calcium 600-200 MG-UNIT per tablet  Take 1 tablet by mouth daily.     dorzolamide-timolol 22.3-6.8 MG/ML ophthalmic solution  Commonly known as:  COSOPT  Place 1 drop into both eyes 2 (two) times daily.     ferrous sulfate 325 (65 FE) MG tablet  Take 325 mg by mouth daily with breakfast.     fluticasone 50 MCG/ACT nasal spray  Commonly known as:  FLONASE  Place 2 sprays into the nose daily.     gabapentin 100 MG capsule  Commonly known as:  NEURONTIN  Take 1 capsule (100 mg total) by mouth at bedtime.     glucosamine-chondroitin 500-400 MG tablet  Take 1 tablet by mouth daily.     lisinopril 20 MG tablet  Commonly known as:  PRINIVIL,ZESTRIL  Take 1 tablet by mouth daily as needed (when blood pressure is high).     NIFEdipine 30 MG 24 hr tablet  Commonly known as:  PROCARDIA-XL/ADALAT CC  Take 30 mg by mouth daily.     OCUVITE PO  Take 1 tablet by mouth daily.     oxybutynin 5 MG 24 hr tablet  Commonly known as:  DITROPAN-XL  Take 5 mg by mouth daily.     SYSTANE ULTRA OP  Apply 1 drop to eye 3 (three) times daily.     XALATAN 0.005 % ophthalmic solution  Generic drug:  latanoprost  Place 1 drop into the right eye at bedtime.        Discharged Condition: Improved     Consults: GI  Significant Diagnostic  Studies: Ct Abdomen Pelvis Wo Contrast  09/08/2014   CLINICAL DATA:  Acute onset of bright red rectal bleeding, diarrhea and lower abdominal pain. Initial encounter.  EXAM: CT ABDOMEN AND PELVIS WITHOUT CONTRAST  TECHNIQUE: Multidetector CT imaging of the abdomen and pelvis was performed following the standard protocol without IV contrast.  COMPARISON:  MRI of the lumbar spine performed 06/20/2009  FINDINGS: The visualized lung bases are clear. A small pericardial effusion is thought to remain within normal limits. Scattered coronary artery calcification is noted. Mild calcification is seen at the aortic valve.  The liver and spleen are unremarkable in appearance. The gallbladder is within normal limits. The pancreas and adrenal glands are unremarkable.  Nonspecific bilateral perinephric stranding is noted. The kidneys are otherwise grossly unremarkable. There is no evidence of hydronephrosis. No renal or ureteral stones are seen.  No free fluid is identified. The small bowel is unremarkable in appearance. The stomach is within normal limits. No acute vascular abnormalities are seen. Scattered calcification is noted along the abdominal aorta and its branches.  The appendix is not well characterized; there is no evidence of appendicitis. Minimal diverticulosis is  noted along the ascending colon, and scattered diverticulosis is noted along the distal transverse, descending and sigmoid colon. Contrast progresses to the level of the mid to distal transverse colon. The remainder of the colon is partially filled with stool and is grossly unremarkable.  The bladder is mildly distended and grossly unremarkable in appearance. The patient is status post hysterectomy. No suspicious adnexal masses are seen. The ovaries are relatively symmetric. No inguinal lymphadenopathy is seen.  No acute osseous abnormalities are identified. Vacuum phenomenon is noted at L3-L4 and L5-S1, without significant disc space narrowing.   IMPRESSION: 1. The source of the patient's bleeding is not well characterized on noncontrast CT. Diverticulosis noted along the distal transverse, descending and sigmoid colon, with minimal diverticulosis along the ascending colon. No evidence of acute diverticulitis at this time. 2. Nonspecific bilateral perinephric stranding may remain within normal limits. 3. Scattered coronary artery calcification seen. Mild calcification at the aortic valve. 4. Scattered calcification along the abdominal aorta and its branches. 5. Minimal degenerative change at the lower lumbar spine.   Electronically Signed   By: Garald Balding M.D.   On: 09/08/2014 23:08    Lab Results: Basic Metabolic Panel:  Recent Labs  09/10/14 0534  NA 142  K 4.5  CL 114*  CO2 21  GLUCOSE 86  BUN 36*  CREATININE 1.62*  CALCIUM 8.5   Liver Function Tests:  Recent Labs  09/10/14 0534  AST 17  ALT 16  ALKPHOS 63  BILITOT 0.3  PROT 5.9*  ALBUMIN 3.1*     CBC:  Recent Labs  09/10/14 0534 09/11/14 0656  WBC 5.1 6.1  NEUTROABS 2.6 3.3  HGB 9.9* 9.8*  HCT 30.5* 29.6*  MCV 100.3* 101.4*  PLT 217 202    Recent Results (from the past 240 hour(s))  MRSA PCR Screening     Status: None   Collection Time: 09/08/14 10:50 PM  Result Value Ref Range Status   MRSA by PCR NEGATIVE NEGATIVE Final    Comment:        The GeneXpert MRSA Assay (FDA approved for NASAL specimens only), is one component of a comprehensive MRSA colonization surveillance program. It is not intended to diagnose MRSA infection nor to guide or monitor treatment for MRSA infections.      Hospital Course: This is an 81 year old who came to the emergency department because of bleeding. She has a history of a significant gastrointestinal bleed in the past and she says she started bleeding about 12 hours before she came to the emergency department. She had several episodes of bleeding and had some more bloody stool in the emergency department.  After that bloody stools she had no further bleeding. She dropped her hemoglobin by 2 g but was not felt to need a transfusion. She had GI consultation. It was not felt that she needed endoscopy or colonoscopy at this time. She was stable her hemoglobin remained at approximately 9.8 and she felt well.  Discharge Exam: Blood pressure 130/54, pulse 69, temperature 98.4 F (36.9 C), temperature source Oral, resp. rate 16, height 5\' 2"  (1.575 m), weight 58.514 kg (129 lb), SpO2 100 %. She is awake and alert. Chest is clear. Heart is regular.  Disposition: Home she does not feel she needs home health services. She will follow-up in my office and with GI      Discharge Instructions    Discharge patient    Complete by:  As directed  Signed: Kartier Bennison L   09/12/2014, 8:12 AM

## 2014-09-20 ENCOUNTER — Ambulatory Visit (HOSPITAL_COMMUNITY): Payer: Medicare Other | Attending: Pulmonary Disease | Admitting: Speech Pathology

## 2014-09-20 DIAGNOSIS — R490 Dysphonia: Secondary | ICD-10-CM

## 2014-09-20 NOTE — Therapy (Signed)
[place], [place], [address] Phone: [phone]   Fax:  [phone]  Speech Language Pathology Evaluation  Patient Details  Name: Cindy Schultz MRN: [id number] Date of Birth: 12/10/1923 Referring Provider:  Sinda Du, MD  Encounter Date: 09/20/2014      End of Session - 09/20/14 1618    Visit Number 1   Number of Visits 6   Date for SLP Re-Evaluation 11/16/14   Authorization Type Medicare   Authorization Time Period 09/20/2014-11/16/2014   Authorization - Visit Number 1   Authorization - Number of Visits 6   SLP Start Time 1430   SLP Stop Time  1515   SLP Time Calculation (min) 45 min   Activity Tolerance Patient tolerated treatment well      Past Medical History  Diagnosis Date  . Macular degeneration   . Glaucoma   . Precancerous changes of the cervix     lesions  . Rectocele   . Diverticulitis   . Lumbar spine pain   . Pain in joints   . Hearing loss   . HTN (hypertension)     cholesterol  . Lymphedema   . DVT (deep venous thrombosis) 07/14/10    LEA doppler   . SOB (shortness of breath) 04/04/2010    2D echo EF=>55%    Past Surgical History  Procedure Laterality Date  . Salk    . Tracheotomy    . Fallen uterus    . Ovarian cyst surgery    . Bladder tac    . Hemmorrhoidectomy    . Met osteostomy    . Left total knee replacement  05/02/04    [name]  . Morton's nueroma    . Elevated metatarsal arch    . Rupture rt groin    . Appendix    . Rectocele repair    . Corneal erosion      cataracts   . Colonoscopy  09/20/2011    Procedure: COLONOSCOPY;  Surgeon: Beryle Beams, MD;  Location: [facility] ENDOSCOPY;  Service: Endoscopy;  Laterality: N/A;  . Abdominal hysterectomy      There were no vitals filed for this visit.  Visit Diagnosis: Dysphonia - Plan: SLP plan of care cert/re-cert      Subjective Assessment - 09/20/14 1452    Subjective "I squeak in the morning and croak  in the evening."   Currently in Pain? No/denies            SLP Evaluation [facility] - 09/20/14 1452    SLP Visit Information   SLP Received On 09/20/14   Onset Date March 2015   Medical Diagnosis Dysphonia   Subjective   Subjective "I squeak in the morning and croak in the afternoon."   Patient/Family Stated Goal "Get my voice better."   General Information   HPI Ms. Corrinne Benegas is an 81 yo woman who was referred by Dr. Luan Pulling for voice therapy following an evaluation with Dr. Redmond Pulling, ENT. Ms. Shvartsman reports that she first noticed a change in her voice in March of 2015 and assumed it was related to allergies, but it never went away. She saw Dr. Benjamine Mola, ENT who referred her to [facility] where per pt report she saw a therapist there who recommended voice therapy. Pt was unable to travel that distance and then went to see Dr. Redmond Pulling (ENT) who suggested voice therapy at [facility]. Unfortunately, I am unable to find any records on EPIC from  her visit at [facility] (it sounds as if she had videostroboscopy). Pt is going to return with the information on who she saw in attempt to obtain records.    Behavioral/Cognition WFL   Mobility Status Independent   Prior Functional Status   Cognitive/Linguistic Baseline Within functional limits   Type of Home House    Lives With Alone   Available Support Family   Cognition   Overall Cognitive Status Within Functional Limits for tasks assessed   Auditory Comprehension   Overall Auditory Comprehension Appears within functional limits for tasks assessed   Visual Recognition/Discrimination   Discrimination Not tested   Reading Comprehension   Reading Status Not tested   Expression   Primary Mode of Expression Verbal   Verbal Expression   Overall Verbal Expression Appears within functional limits for tasks assessed   Oral Motor/Sensory Function   Overall Oral Motor/Sensory Function Appears within functional limits for tasks assessed   Motor Speech    Overall Motor Speech Impaired   Respiration Impaired   Level of Impairment Conversation   Phonation Hoarse   Resonance Within functional limits   Articulation Within functional limitis   Intelligibility Intelligible   Motor Planning Witnin functional limits   Motor Speech Errors Not applicable   Phonation Impaired   Volume Appropriate   Pitch Low                SLP Short Term Goals - 22-Sep-2014 1623    SLP SHORT TERM GOAL #1   Title Pt will implement vocal hygiene techniques after initial review and use of written reminders per pt report.   Baseline No knowledge of techniques   Time 6   Period Weeks   Status New   SLP SHORT TERM GOAL #2   Title Pt will implement vocal warm up exercises with use of written cue prior to discharge from therapy.   Baseline None   Time 6   Period Weeks   Status New          SLP Long Term Goals - 09/22/2014 1625    SLP LONG TERM GOAL #1   Title Pt will improve self rating of vocal quality from very poor to average with use of strategies and techniques.   Baseline very poor   Time 6   Period Weeks   Status New          Plan - 09-22-2014 1619    Clinical Impression Statement Ms. Hafer presents with mild vocal dysphonia characterized by hoarse/harsh vocal quality with decreased breath support. She reports that her symptoms are worse in the morning and seem to get better throughout the day. Recommend voice therapy for vocal hygiene techniques, breath support, vocal warm up exercises, and compensatory strategies to maximize vocal function.   Speech Therapy Frequency 1x /week   Duration --  6 weeks   Treatment/Interventions SLP instruction and feedback;Compensatory strategies;Patient/family education   Potential to Achieve Goals Good   Potential Considerations Other (comment)  time since onset (>one year)   SLP Home Exercise Plan Pt will complete HEP as assigned to facilitate carryover of treatment strategies and techniques in  home/community environment.   Consulted and Agree with Plan of Care Patient          G-Codes - 09/22/14 1517    Functional Assessment Tool Used clinical judgement   Functional Limitations Voice   Voice Current Status (G9171) At least 20 percent but less than 40 percent impaired, limited or restricted   Voice  Goal Status [phone]) At least 1 percent but less than 20 percent impaired, limited or restricted      Problem List Patient Active Problem List   Diagnosis Date Noted  . Acute blood loss anemia 09/10/2014  . GIB (gastrointestinal bleeding) 09/08/2014  . Occlusion and stenosis of carotid artery without mention of cerebral infarction 12/14/2013  . Osteoarthritis of left knee 04/23/2013  . Thumb pain 10/22/2012  . De Quervain's syndrome (tenosynovitis) 10/22/2012  . [facility] DJD(carpometacarpal degenerative joint disease), localized primary 10/22/2012  . CTS (carpal tunnel syndrome) 10/22/2012  . Trigger thumb of right hand 10/22/2012  . Pes anserinus bursitis of right knee 06/26/2012  . OA (osteoarthritis) of knee 06/26/2012  . Leg pain, left 06/26/2012  . Radicular pain of left lower extremity 06/26/2012  . Diverticulosis of colon with hemorrhage 09/22/2011  . Syncopal episodes 09/20/2011  . GI bleed 09/19/2011  . HTN (hypertension) 09/19/2011  . Anemia due to blood loss, acute 09/19/2011  . Abnormality of gait 01/03/2011  . Personal history of fall 12/27/2010  . DISLOCATION CLOSED SHOULDER NEC 04/25/2010  . ANSERINE BURSITIS, LEFT 02/13/2010  . KNEE PAIN 12/22/2009  . LEG PAIN, BILATERAL 12/22/2009  . TOTAL KNEE FOLLOW-UP 12/22/2009  . HIP PAIN 02/07/2009  . LOW BACK PAIN 02/07/2009   Thank you,  Genene Churn, [place]  [facility] 09/20/2014, 4:28 PM  [place], [place], [address] Phone: [phone]   Fax:  [phone]

## 2014-09-23 ENCOUNTER — Ambulatory Visit (INDEPENDENT_AMBULATORY_CARE_PROVIDER_SITE_OTHER): Payer: Medicare Other | Admitting: Otolaryngology

## 2014-09-23 DIAGNOSIS — H903 Sensorineural hearing loss, bilateral: Secondary | ICD-10-CM

## 2014-09-28 ENCOUNTER — Ambulatory Visit (HOSPITAL_COMMUNITY): Payer: Medicare Other | Admitting: Speech Pathology

## 2014-09-28 DIAGNOSIS — R490 Dysphonia: Secondary | ICD-10-CM

## 2014-09-28 NOTE — Therapy (Signed)
[place], [place], [address] Phone: [phone]   Fax:  [phone]  Speech Language Pathology Treatment  Patient Details  Name: Cindy Schultz MRN: [id number] Date of Birth: 10-23-1923 Referring Provider:  Sinda Du, MD  Encounter Date: 09/28/2014      End of Session - 09/28/14 1228    Visit Number 2   Number of Visits 6   Date for SLP Re-Evaluation 11/16/14   Authorization Type Medicare   Authorization Time Period 09/20/2014-11/16/2014   Authorization - Visit Number 2   Authorization - Number of Visits 6   SLP Start Time 1100   SLP Stop Time  1145   SLP Time Calculation (min) 45 min   Activity Tolerance Patient tolerated treatment well      Past Medical History  Diagnosis Date  . Macular degeneration   . Glaucoma   . Precancerous changes of the cervix     lesions  . Rectocele   . Diverticulitis   . Lumbar spine pain   . Pain in joints   . Hearing loss   . HTN (hypertension)     cholesterol  . Lymphedema   . DVT (deep venous thrombosis) 07/14/10    LEA doppler   . SOB (shortness of breath) 04/04/2010    2D echo EF=>55%    Past Surgical History  Procedure Laterality Date  . Salk    . Tracheotomy    . Fallen uterus    . Ovarian cyst surgery    . Bladder tac    . Hemmorrhoidectomy    . Met osteostomy    . Left total knee replacement  05/02/04    [name]  . Morton's nueroma    . Elevated metatarsal arch    . Rupture rt groin    . Appendix    . Rectocele repair    . Corneal erosion      cataracts   . Colonoscopy  09/20/2011    Procedure: COLONOSCOPY;  Surgeon: Beryle Beams, MD;  Location: [facility] ENDOSCOPY;  Service: Endoscopy;  Laterality: N/A;  . Abdominal hysterectomy      There were no vitals filed for this visit.  Visit Diagnosis: Dysphonia      Subjective Assessment - 09/28/14 1221    Subjective "I've had better days"   Currently in Pain? No/denies                ADULT SLP TREATMENT - 09/28/14 0001    General Information   Behavior/Cognition Alert;Cooperative;Pleasant mood   Patient Positioning Upright in chair   Oral care provided N/A   HPI Cindy Schultz is an 81 yo woman who was referred by Dr. Luan Pulling for voice therapy following an evaluation with Dr. Redmond Pulling and Dr. Benjamine Mola, ENT. Cindy Schultz reports that she first noticed a change in her voice in March of 2015 and assumed it was related to allergies, but it never went away. She saw Dr. Benjamine Mola, ENT who referred her to [facility] where per pt report she saw a therapist there who recommended voice therapy. Pt was unable to travel that distance and then went to see Dr. Redmond Pulling (ENT) who suggested voice therapy at [facility].  Cindy Schultz presents with mild vocal dysphonia characterized by hoarse/harsh vocal quality with decreased breath support. She reports that her symptoms are worse in the morning and seem to get better throughout the day. Recommend voice therapy for vocal hygiene techniques, breath support, vocal warm up  exercises, and compensatory strategies to maximize vocal function.    Treatment Provided   Treatment provided Cognitive-Linquistic   Pain Assessment   Pain Assessment No/denies pain   Cognitive-Linquistic Treatment   Treatment focused on Voice   Skilled Treatment vocal hygiene education, breath/body positioning, vocal exercises, compensatory strategies   Assessment / Recommendations / Plan   Plan Continue with current plan of care   Progression Toward Goals   Progression toward goals Progressing toward goals          SLP Education - 09/28/14 1228    Education provided Yes   Education Details vocal hygiene handout given.   Person(s) Educated Patient   Methods Explanation;Handout   Comprehension Verbalized understanding;Need further instruction          SLP Short Term Goals - 09/28/14 1229    SLP SHORT TERM GOAL #1   Title Pt will implement vocal hygiene  techniques after initial review and use of written reminders per pt report.   Baseline No knowledge of techniques   Time 6   Period Weeks   Status New   SLP SHORT TERM GOAL #2   Title Pt will implement vocal warm up exercises with use of written cue prior to discharge from therapy.   Baseline None   Time 6   Period Weeks   Status New          SLP Long Term Goals - 09/28/14 1230    SLP LONG TERM GOAL #1   Title Pt will improve self rating of vocal quality from very poor to average with use of strategies and techniques.   Baseline very poor   Time 6   Period Weeks   Status New          Plan - 09/28/14 1229    Clinical Impression Statement Cindy Schultz was in a pleasant mood when she arrived to therapy today. She was interested in discussing medical history related to voice issues and wanting her voice to sound better. Today's session was spent in review of assessment and introducing course of treatment including 4 areas to target: vocal hygiene, breath and body support, vocal exercises, and compensatory strategies. Discussed the importance of hydration and learning strategies for how to best use of voice in order to minimize vocal damage. Discussed how some external factors such as medications can increase dehydration of the voice box. A vocal hygiene handout was given to Cindy Schultz for her to read on her own and information will be reviewed next session. Also practice breathing techniques and discuss posture.    Speech Therapy Frequency 1x /week   Duration --  6 weeks   Treatment/Interventions SLP instruction and feedback;Compensatory strategies;Patient/family education   Potential to Achieve Goals Good   Potential Considerations Other (comment)  time since onset (>one year)   SLP Home Exercise Plan Pt will complete HEP as assigned to facilitate carryover of treatment strategies and techniques in home/community environment.   Consulted and Agree with Plan of Care Patient         Problem List Patient Active Problem List   Diagnosis Date Noted  . Acute blood loss anemia 09/10/2014  . GIB (gastrointestinal bleeding) 09/08/2014  . Occlusion and stenosis of carotid artery without mention of cerebral infarction 12/14/2013  . Osteoarthritis of left knee 04/23/2013  . Thumb pain 10/22/2012  . De Quervain's syndrome (tenosynovitis) 10/22/2012  . [facility] DJD(carpometacarpal degenerative joint disease), localized primary 10/22/2012  . CTS (carpal tunnel syndrome) 10/22/2012  . Trigger  thumb of right hand 10/22/2012  . Pes anserinus bursitis of right knee 06/26/2012  . OA (osteoarthritis) of knee 06/26/2012  . Leg pain, left 06/26/2012  . Radicular pain of left lower extremity 06/26/2012  . Diverticulosis of colon with hemorrhage 09/22/2011  . Syncopal episodes 09/20/2011  . GI bleed 09/19/2011  . HTN (hypertension) 09/19/2011  . Anemia due to blood loss, acute 09/19/2011  . Abnormality of gait 01/03/2011  . Personal history of fall 12/27/2010  . DISLOCATION CLOSED SHOULDER NEC 04/25/2010  . ANSERINE BURSITIS, LEFT 02/13/2010  . KNEE PAIN 12/22/2009  . LEG PAIN, BILATERAL 12/22/2009  . TOTAL KNEE FOLLOW-UP 12/22/2009  . HIP PAIN 02/07/2009  . LOW BACK PAIN 02/07/2009    Larence Penning 09/28/2014, 12:33 PM  [place] [address] [place], [place], [address] Phone: [phone]   Fax:  [phone]

## 2014-10-04 ENCOUNTER — Ambulatory Visit (HOSPITAL_COMMUNITY): Payer: Medicare Other | Admitting: Speech Pathology

## 2014-10-04 DIAGNOSIS — R490 Dysphonia: Secondary | ICD-10-CM

## 2014-10-04 NOTE — Therapy (Signed)
[facility], [place], [address] Phone: [phone]   Fax:  [phone]  Speech Language Pathology Treatment  Patient Details  Name: [facility] MRN: [id number] Date of Birth: 10/17/1923 Referring Provider:  Sinda Du, MD  Encounter Date: 10/04/2014      End of Session - 10/04/14 1610    Visit Number 3   Number of Visits 6   Date for SLP Re-Evaluation 11/16/14   Authorization Type Medicare   Authorization Time Period 09/20/2014-11/16/2014   Authorization - Visit Number 3   Authorization - Number of Visits 6   SLP Start Time 1840   SLP Stop Time  1430   SLP Time Calculation (min) 45 min   Activity Tolerance Patient tolerated treatment well      Past Medical History  Diagnosis Date  . Macular degeneration   . Glaucoma   . Precancerous changes of the cervix     lesions  . Rectocele   . Diverticulitis   . Lumbar spine pain   . Pain in joints   . Hearing loss   . HTN (hypertension)     cholesterol  . Lymphedema   . DVT (deep venous thrombosis) 07/14/10    LEA doppler   . SOB (shortness of breath) 04/04/2010    2D echo EF=>55%    Past Surgical History  Procedure Laterality Date  . Salk    . Tracheotomy    . Fallen uterus    . Ovarian cyst surgery    . Bladder tac    . Hemmorrhoidectomy    . Met osteostomy    . Left total knee replacement  05/02/04    [name]  . Morton's nueroma    . Elevated metatarsal arch    . Rupture rt groin    . Appendix    . Rectocele repair    . Corneal erosion      cataracts   . Colonoscopy  09/20/2011    Procedure: COLONOSCOPY;  Surgeon: Beryle Beams, MD;  Location: [facility] ENDOSCOPY;  Service: Endoscopy;  Laterality: N/A;  . Abdominal hysterectomy      There were no vitals filed for this visit.  Visit Diagnosis: Dysphonia      Subjective Assessment - 10/04/14 1433    Subjective "I'm still alive!"   Currently in Pain? No/denies               ADULT SLP TREATMENT - 10/04/14 1602    General Information   Behavior/Cognition Alert;Cooperative;Pleasant mood   Patient Positioning Upright in chair   Oral care provided N/A   HPI Ms. Cindy Schultz is an 81 yo woman who was referred by Dr. Luan Pulling for voice therapy following an evaluation with Dr. Redmond Pulling and Dr. Benjamine Mola, ENT. Ms. Cindy Schultz reports that she first noticed a change in her voice in March of 2015 and assumed it was related to allergies, but it never went away. She saw Dr. Benjamine Mola, ENT who referred [facility] to [facility] where per pt report she saw a therapist there who recommended voice therapy. Pt was unable to travel that distance and then went to see Dr. Redmond Pulling (ENT) who suggested voice therapy at [facility].  Ms. Cindy Schultz presents with mild vocal dysphonia characterized by hoarse/harsh vocal quality with decreased breath support. She reports that her symptoms are worse in the morning and seem to get better throughout the day. Recommend voice therapy for vocal hygiene techniques, breath support, vocal warm up exercises, and  compensatory strategies to maximize vocal function.    Treatment Provided   Treatment provided Cognitive-Linquistic   Pain Assessment   Pain Assessment No/denies pain   Cognitive-Linquistic Treatment   Treatment focused on Voice;Patient/family/caregiver education   Skilled Treatment vocal hygiene education, breath/body positioning, vocal exercises, compensatory strategies   Assessment / Recommendations / Plan   Plan Continue with current plan of care   Progression Toward Goals   Progression toward goals Progressing toward goals          SLP Education - 10/04/14 1605    Education Details "[facility] Your [facility]" summary provided   Person(s) Educated Patient   Methods Explanation;Demonstration;Handout   Comprehension Verbalized understanding;Need further instruction          SLP Short Term Goals - 10/04/14 1620    SLP SHORT TERM GOAL #1   Title Pt will  implement vocal hygiene techniques after initial review and use of written reminders per pt report.   Baseline No knowledge of techniques   Time 6   Period Weeks   Status On-going   SLP SHORT TERM GOAL #2   Title Pt will implement vocal warm up exercises with use of written cue prior to discharge from therapy.   Baseline None   Time 6   Period Weeks   Status On-going          SLP Long Term Goals - 10/04/14 1620    SLP LONG TERM GOAL #1   Title Pt will improve self rating of vocal quality from very poor to average with use of strategies and techniques.   Baseline very poor   Time 6   Period Weeks   Status On-going          Plan - 10/04/14 1613    Clinical Impression Statement Ms. Cindy Schultz arrived to therapy with copies of appointment records from her visit with [facility], SLP at [facility]. This visit was in April 2015. I have sent a request for medical records, pt signed release. Ms. Cindy Schultz stated that she has been trying to drink more water as the SLP encouraged last week. She reports that her vocal quality is "OK" for this time of day. She reiterates that it is worse in the AM. Ms. Cindy Schultz enjoys talking and needs assist to stay on task during session. Breathing exercises were introduced and elicited with clinician model and moderate cues (hand to belly for feedback, "smell the flowers, blow out the candle"). Yawn sigh technique was attempted, however proved difficult for patient to implement. She tends to quickly fall into "glottal fry". Pt able to achieve 3 pitch levels with "mmm", but difficult for her to glide them (can only stairstep). Next session, introduce lip trill and show patient a written outline of session so that all tasks are covered due to pt verbosity.   Speech Therapy Frequency 1x /week   Duration --  6 weeks   Treatment/Interventions SLP instruction and feedback;Compensatory strategies;Patient/family education   Potential to Achieve Goals Good   Potential  Considerations Other (comment)  time since onset (>one year)   SLP Home Exercise Plan Pt will complete HEP as assigned to facilitate carryover of treatment strategies and techniques in home/community environment.   Consulted and Agree with Plan of Care Patient        Problem List Patient Active Problem List   Diagnosis Date Noted  . Acute blood loss anemia 09/10/2014  . GIB (gastrointestinal bleeding) 09/08/2014  . Occlusion and stenosis of carotid  artery without mention of cerebral infarction 12/14/2013  . Osteoarthritis of left knee 04/23/2013  . Thumb pain 10/22/2012  . De Quervain's syndrome (tenosynovitis) 10/22/2012  . [facility] DJD(carpometacarpal degenerative joint disease), localized primary 10/22/2012  . CTS (carpal tunnel syndrome) 10/22/2012  . Trigger thumb of right hand 10/22/2012  . Pes anserinus bursitis of right knee 06/26/2012  . OA (osteoarthritis) of knee 06/26/2012  . Leg pain, left 06/26/2012  . Radicular pain of left lower extremity 06/26/2012  . Diverticulosis of colon with hemorrhage 09/22/2011  . Syncopal episodes 09/20/2011  . GI bleed 09/19/2011  . HTN (hypertension) 09/19/2011  . Anemia due to blood loss, acute 09/19/2011  . Abnormality of gait 01/03/2011  . Personal history of fall 12/27/2010  . DISLOCATION CLOSED SHOULDER NEC 04/25/2010  . ANSERINE BURSITIS, LEFT 02/13/2010  . KNEE PAIN 12/22/2009  . LEG PAIN, BILATERAL 12/22/2009  . TOTAL KNEE FOLLOW-UP 12/22/2009  . HIP PAIN 02/07/2009  . LOW BACK PAIN 02/07/2009   Thank you,  Genene Churn, [place]  [facility] 10/04/2014, 4:21 PM  [place] [address] [place], [place], [address] Phone: [phone]   Fax:  [phone]

## 2014-10-05 ENCOUNTER — Ambulatory Visit (HOSPITAL_COMMUNITY): Payer: PRIVATE HEALTH INSURANCE | Admitting: Speech Pathology

## 2014-10-05 DIAGNOSIS — Z08 Encounter for follow-up examination after completed treatment for malignant neoplasm: Secondary | ICD-10-CM | POA: Diagnosis not present

## 2014-10-05 DIAGNOSIS — Z85828 Personal history of other malignant neoplasm of skin: Secondary | ICD-10-CM | POA: Diagnosis not present

## 2014-10-05 DIAGNOSIS — L570 Actinic keratosis: Secondary | ICD-10-CM | POA: Diagnosis not present

## 2014-10-05 DIAGNOSIS — X32XXXD Exposure to sunlight, subsequent encounter: Secondary | ICD-10-CM | POA: Diagnosis not present

## 2014-10-05 DIAGNOSIS — L821 Other seborrheic keratosis: Secondary | ICD-10-CM | POA: Diagnosis not present

## 2014-10-11 ENCOUNTER — Ambulatory Visit (HOSPITAL_COMMUNITY): Payer: Medicare Other | Admitting: Speech Pathology

## 2014-10-11 DIAGNOSIS — R490 Dysphonia: Secondary | ICD-10-CM

## 2014-10-11 NOTE — Therapy (Signed)
[place], [place], [address] Phone: [phone]   Fax:  [phone]  Speech Language Pathology Treatment  Patient Details  Name: Cindy Schultz MRN: [id number] Date of Birth: 24-Apr-1924 Referring Provider:  Sinda Du, MD  Encounter Date: 10/11/2014      End of Session - 10/11/14 1407    Visit Number 4   Number of Visits 6   Date for SLP Re-Evaluation 11/16/14   Authorization Type Medicare   Authorization Time Period 09/20/2014-11/16/2014   Authorization - Visit Number 4   Authorization - Number of Visits 6   SLP Start Time 6948   SLP Stop Time  1430   SLP Time Calculation (min) 43 min   Activity Tolerance Patient tolerated treatment well      Past Medical History  Diagnosis Date  . Macular degeneration   . Glaucoma   . Precancerous changes of the cervix     lesions  . Rectocele   . Diverticulitis   . Lumbar spine pain   . Pain in joints   . Hearing loss   . HTN (hypertension)     cholesterol  . Lymphedema   . DVT (deep venous thrombosis) 07/14/10    LEA doppler   . SOB (shortness of breath) 04/04/2010    2D echo EF=>55%    Past Surgical History  Procedure Laterality Date  . Salk    . Tracheotomy    . Fallen uterus    . Ovarian cyst surgery    . Bladder tac    . Hemmorrhoidectomy    . Met osteostomy    . Left total knee replacement  05/02/04    [name]  . Morton's nueroma    . Elevated metatarsal arch    . Rupture rt groin    . Appendix    . Rectocele repair    . Corneal erosion      cataracts   . Colonoscopy  09/20/2011    Procedure: COLONOSCOPY;  Surgeon: Beryle Beams, MD;  Location: [facility] ENDOSCOPY;  Service: Endoscopy;  Laterality: N/A;  . Abdominal hysterectomy      There were no vitals filed for this visit.  Visit Diagnosis: Dysphonia      Subjective Assessment - 10/11/14 1352    Subjective "I burned my wrist on the toaster oven."   Currently in Pain? No/denies                ADULT SLP TREATMENT - 10/11/14 0001    General Information   Behavior/Cognition Alert;Cooperative;Pleasant mood   Patient Positioning Upright in chair   Oral care provided N/A   HPI Cindy Schultz is an 81 yo woman who was referred by Dr. Luan Pulling for voice therapy following an evaluation with Dr. Redmond Pulling and Dr. Benjamine Mola, ENT. [facility] reports that she first noticed a change in her voice in March of 2015 and assumed it was related to allergies, but it never went away. She saw Dr. Benjamine Mola, ENT who referred her to [facility] where per pt report she saw a therapist there who recommended voice therapy. Pt was unable to travel that distance and then went to see Dr. Redmond Pulling (ENT) who suggested voice therapy at [facility].  [facility] Schultz presents with mild vocal dysphonia characterized by hoarse/harsh vocal quality with decreased breath support. She reports that her symptoms are worse in the morning and seem to get better throughout the day. Recommend voice therapy for vocal hygiene techniques, breath  support, vocal warm up exercises, and compensatory strategies to maximize vocal function.    Treatment Provided   Treatment provided Cognitive-Linquistic   Pain Assessment   Pain Assessment No/denies pain   Cognitive-Linquistic Treatment   Treatment focused on Voice;Patient/family/caregiver education   Skilled Treatment vocal hygiene education, breath/body positioning, vocal exercises, compensatory strategies   Assessment / Recommendations / Plan   Plan Continue with current plan of care   Progression Toward Goals   Progression toward goals Progressing toward goals            SLP Short Term Goals - 10/11/14 1408    SLP SHORT TERM GOAL #1   Title Pt will implement vocal hygiene techniques after initial review and use of written reminders per pt report.   Baseline No knowledge of techniques   Time 6   Period Weeks   Status On-going   SLP SHORT TERM GOAL #2   Title Pt will  implement vocal warm up exercises with use of written cue prior to discharge from therapy.   Baseline None   Time 6   Period Weeks   Status On-going          SLP Long Term Goals - 10/11/14 1408    SLP LONG TERM GOAL #1   Title Pt will improve self rating of vocal quality from very poor to average with use of strategies and techniques.   Baseline very poor   Time 6   Period Weeks   Status On-going          Plan - 10/11/14 1407    Clinical Impression Statement Cindy Schultz needs frequent redirection to therapeutic tasks as she really enjoys talking. I was able to obtain the SLP records from her evaluation at [facility] from last spring and they were reviewed with pt. I explained to Cindy Schultz that our focus would be on increasing breath support to increase vocal intensity and vocal warm up. She required mod cues to implement strategies in session and was given homework to review. Continue therapy as planned.    Speech Therapy Frequency 1x /week   Duration 2 weeks  6 weeks   Treatment/Interventions SLP instruction and feedback;Compensatory strategies;Patient/family education   Potential to Achieve Goals Good   Potential Considerations Other (comment)  time since onset (>one year)   SLP Home Exercise Plan Pt will complete HEP as assigned to facilitate carryover of treatment strategies and techniques in home/community environment.   Consulted and Agree with Plan of Care Patient        Problem List Patient Active Problem List   Diagnosis Date Noted  . Acute blood loss anemia 09/10/2014  . GIB (gastrointestinal bleeding) 09/08/2014  . Occlusion and stenosis of carotid artery without mention of cerebral infarction 12/14/2013  . Osteoarthritis of left knee 04/23/2013  . Thumb pain 10/22/2012  . De Quervain's syndrome (tenosynovitis) 10/22/2012  . [facility] DJD(carpometacarpal degenerative joint disease), localized primary 10/22/2012  . CTS (carpal tunnel syndrome) 10/22/2012  .  Trigger thumb of right hand 10/22/2012  . Pes anserinus bursitis of right knee 06/26/2012  . OA (osteoarthritis) of knee 06/26/2012  . Leg pain, left 06/26/2012  . Radicular pain of left lower extremity 06/26/2012  . Diverticulosis of colon with hemorrhage 09/22/2011  . Syncopal episodes 09/20/2011  . GI bleed 09/19/2011  . HTN (hypertension) 09/19/2011  . Anemia due to blood loss, acute 09/19/2011  . Abnormality of gait 01/03/2011  . Personal history of fall 12/27/2010  . DISLOCATION CLOSED SHOULDER  NEC 04/25/2010  . ANSERINE BURSITIS, LEFT 02/13/2010  . KNEE PAIN 12/22/2009  . LEG PAIN, BILATERAL 12/22/2009  . TOTAL KNEE FOLLOW-UP 12/22/2009  . HIP PAIN 02/07/2009  . LOW BACK PAIN 02/07/2009   Thank you,  Genene Churn, [place]  [facility] 10/11/2014, 4:46 PM  [place] [address] [place], [place], [address] Phone: [phone]   Fax:  [phone]

## 2014-10-12 ENCOUNTER — Ambulatory Visit (HOSPITAL_COMMUNITY): Payer: PRIVATE HEALTH INSURANCE | Admitting: Speech Pathology

## 2014-10-18 ENCOUNTER — Ambulatory Visit (HOSPITAL_COMMUNITY): Payer: Medicare Other | Attending: Pulmonary Disease | Admitting: Speech Pathology

## 2014-10-18 DIAGNOSIS — R490 Dysphonia: Secondary | ICD-10-CM | POA: Insufficient documentation

## 2014-10-18 NOTE — Therapy (Signed)
[place], [place], [address] Phone: [phone]   Fax:  [phone]  Speech Language Pathology Treatment  Patient Details  Name: Cindy Schultz MRN: [id number] Date of Birth: [birth year] Referring Provider:  Sinda Du, MD  Encounter Date: 10/18/2014      End of Session - 10/18/14 1647    Visit Number 5   Number of Visits 6   Date for SLP Re-Evaluation 11/16/14   Authorization Type Medicare   Authorization Time Period 09/20/2014-11/16/2014   Authorization - Visit Number 5   Authorization - Number of Visits 6   SLP Start Time 0102   SLP Stop Time  1438   SLP Time Calculation (min) 41 min   Activity Tolerance Patient tolerated treatment well      Past Medical History  Diagnosis Date  . Macular degeneration   . Glaucoma   . Precancerous changes of the cervix     lesions  . Rectocele   . Diverticulitis   . Lumbar spine pain   . Pain in joints   . Hearing loss   . HTN (hypertension)     cholesterol  . Lymphedema   . DVT (deep venous thrombosis) 07/14/10    LEA doppler   . SOB (shortness of breath) 04/04/2010    2D echo EF=>55%    Past Surgical History  Procedure Laterality Date  . Salk    . Tracheotomy    . Fallen uterus    . Ovarian cyst surgery    . Bladder tac    . Hemmorrhoidectomy    . Met osteostomy    . Left total knee replacement  05/02/04    [name]  . Morton's nueroma    . Elevated metatarsal arch    . Rupture rt groin    . Appendix    . Rectocele repair    . Corneal erosion      cataracts   . Colonoscopy  09/20/2011    Procedure: COLONOSCOPY;  Surgeon: Beryle Beams, MD;  Location: [facility] ENDOSCOPY;  Service: Endoscopy;  Laterality: N/A;  . Abdominal hysterectomy      There were no vitals filed for this visit.  Visit Diagnosis: Dysphonia      Subjective Assessment - 10/18/14 1423    Subjective "As good as I can be."   Currently in Pain? No/denies               ADULT SLP TREATMENT - 10/18/14 1423    General Information   Behavior/Cognition Alert;Cooperative;Pleasant mood   Patient Positioning Upright in chair   Oral care provided N/A   HPI Ms. Cindy Schultz is an 81 yo woman who was referred by Dr. Luan Pulling for voice therapy following an evaluation with Dr. Redmond Pulling and Dr. Benjamine Mola, ENT. Ms. Cindy Schultz reports that she first noticed a change in her voice in March of 2015 and assumed it was related to allergies, but it never went away. She saw Dr. Benjamine Mola, ENT who referred her to [facility] where per pt report she saw a therapist there who recommended voice therapy. Pt was unable to travel that distance and then went to see Dr. Redmond Pulling (ENT) who suggested voice therapy at [facility].  Ms. Cindy Schultz presents with mild vocal dysphonia characterized by hoarse/harsh vocal quality with decreased breath support. She reports that her symptoms are worse in the morning and seem to get better throughout the day. Recommend voice therapy for vocal hygiene techniques, breath support, vocal warm  up exercises, and compensatory strategies to maximize vocal function.    Treatment Provided   Treatment provided Cognitive-Linquistic   Pain Assessment   Pain Assessment No/denies pain   Cognitive-Linquistic Treatment   Treatment focused on Voice;Patient/family/caregiver education   Skilled Treatment vocal hygiene education, breath/body positioning, vocal exercises, compensatory strategies   Assessment / Recommendations / Plan   Plan Continue with current plan of care   Progression Toward Goals   Progression toward goals Progressing toward goals            SLP Short Term Goals - 10/18/14 1651    SLP SHORT TERM GOAL #1   Title Pt will implement vocal hygiene techniques after initial review and use of written reminders per pt report.   Baseline No knowledge of techniques   Time 6   Period Weeks   Status On-going   SLP SHORT TERM GOAL #2   Title Pt will implement vocal warm up  exercises with use of written cue prior to discharge from therapy.   Baseline None   Time 6   Period Weeks   Status On-going          SLP Long Term Goals - 10/18/14 1651    SLP LONG TERM GOAL #1   Title Pt will improve self rating of vocal quality from very poor to average with use of strategies and techniques.   Baseline very poor   Time 6   Period Weeks   Status On-going          Plan - 10/18/14 1648    Clinical Impression Statement Ms. Cindy Schultz required mod/max verbal visual cues for breath support and voicing during controlled practice. She benefitted from voice recording for feedback as she tends to voice during exhalation (glottal fry ~). Pt reports occasional practice at home, but states she gets "mixed up". SLP re-wrote HEP for short vocal warm up and reveiwed with pt. Next session, focus more on strengthening vocal folds/adduction exercises.   Speech Therapy Frequency 1x /week   Duration 1 week  6 weeks   Treatment/Interventions SLP instruction and feedback;Compensatory strategies;Patient/family education   Potential to Achieve Goals Good   Potential Considerations Other (comment)  time since onset (>one year)   SLP Home Exercise Plan Pt will complete HEP as assigned to facilitate carryover of treatment strategies and techniques in home/community environment.   Consulted and Agree with Plan of Care Patient        Problem List Patient Active Problem List   Diagnosis Date Noted  . Acute blood loss anemia 09/10/2014  . GIB (gastrointestinal bleeding) 09/08/2014  . Occlusion and stenosis of carotid artery without mention of cerebral infarction 12/14/2013  . Osteoarthritis of left knee 04/23/2013  . Thumb pain 10/22/2012  . De Quervain's syndrome (tenosynovitis) 10/22/2012  . [facility] DJD(carpometacarpal degenerative joint disease), localized primary 10/22/2012  . CTS (carpal tunnel syndrome) 10/22/2012  . Trigger thumb of right hand 10/22/2012  . Pes anserinus  bursitis of right knee 06/26/2012  . OA (osteoarthritis) of knee 06/26/2012  . Leg pain, left 06/26/2012  . Radicular pain of left lower extremity 06/26/2012  . Diverticulosis of colon with hemorrhage 09/22/2011  . Syncopal episodes 09/20/2011  . GI bleed 09/19/2011  . HTN (hypertension) 09/19/2011  . Anemia due to blood loss, acute 09/19/2011  . Abnormality of gait 01/03/2011  . Personal history of fall 12/27/2010  . DISLOCATION CLOSED SHOULDER NEC 04/25/2010  . ANSERINE BURSITIS, LEFT 02/13/2010  . KNEE PAIN 12/22/2009  .  LEG PAIN, BILATERAL 12/22/2009  . TOTAL KNEE FOLLOW-UP 12/22/2009  . HIP PAIN 02/07/2009  . LOW BACK PAIN 02/07/2009   Thank you,  Genene Churn, [place]  [facility] 10/18/2014, 4:53 PM  [place] [address] [place], [place], [address] Phone: [phone]   Fax:  [phone]

## 2014-10-19 ENCOUNTER — Ambulatory Visit (HOSPITAL_COMMUNITY): Payer: PRIVATE HEALTH INSURANCE | Admitting: Speech Pathology

## 2014-10-22 DIAGNOSIS — H52221 Regular astigmatism, right eye: Secondary | ICD-10-CM | POA: Diagnosis not present

## 2014-10-22 DIAGNOSIS — H524 Presbyopia: Secondary | ICD-10-CM | POA: Diagnosis not present

## 2014-10-22 DIAGNOSIS — H3530 Unspecified macular degeneration: Secondary | ICD-10-CM | POA: Diagnosis not present

## 2014-10-22 DIAGNOSIS — H5213 Myopia, bilateral: Secondary | ICD-10-CM | POA: Diagnosis not present

## 2014-10-25 ENCOUNTER — Ambulatory Visit (HOSPITAL_COMMUNITY): Payer: Medicare Other | Admitting: Speech Pathology

## 2014-10-25 DIAGNOSIS — R490 Dysphonia: Secondary | ICD-10-CM

## 2014-10-25 NOTE — Therapy (Signed)
[place], [place], [address] Phone: [phone]   Fax:  [phone]  Speech Language Pathology Treatment  Patient Details  Name: Cindy Schultz MRN: [id number] Date of Birth: 1924/05/22 Referring Provider:  Sinda Du, MD  Encounter Date: 10/25/2014      End of Session - 10/25/14 1405    Visit Number 6   Number of Visits 7   Date for SLP Re-Evaluation 11/16/14   Authorization Type Medicare   Authorization Time Period 09/20/2014-11/16/2014   Authorization - Visit Number 6   Authorization - Number of Visits 7   SLP Start Time 8250   SLP Stop Time  1430   SLP Time Calculation (min) 45 min   Activity Tolerance Patient tolerated treatment well      Past Medical History  Diagnosis Date  . Macular degeneration   . Glaucoma   . Precancerous changes of the cervix     lesions  . Rectocele   . Diverticulitis   . Lumbar spine pain   . Pain in joints   . Hearing loss   . HTN (hypertension)     cholesterol  . Lymphedema   . DVT (deep venous thrombosis) 07/14/10    LEA doppler   . SOB (shortness of breath) 04/04/2010    2D echo EF=>55%    Past Surgical History  Procedure Laterality Date  . Salk    . Tracheotomy    . Fallen uterus    . Ovarian cyst surgery    . Bladder tac    . Hemmorrhoidectomy    . Met osteostomy    . Left total knee replacement  05/02/04    [name]  . Morton's nueroma    . Elevated metatarsal arch    . Rupture rt groin    . Appendix    . Rectocele repair    . Corneal erosion      cataracts   . Colonoscopy  09/20/2011    Procedure: COLONOSCOPY;  Surgeon: Beryle Beams, MD;  Location: [facility] ENDOSCOPY;  Service: Endoscopy;  Laterality: N/A;  . Abdominal hysterectomy      There were no vitals filed for this visit.  Visit Diagnosis: Dysphonia      Subjective Assessment - 10/25/14 1356    Subjective "My nose just runs in the morning."   Currently in Pain? No/denies                ADULT SLP TREATMENT - 10/25/14 1405    General Information   Behavior/Cognition Alert;Cooperative   Patient Positioning Upright in chair   Oral care provided N/A   HPI Cindy Schultz is an 81 yo woman who was referred by Dr. Luan Pulling for voice therapy following an evaluation with Dr. Redmond Pulling and Dr. Benjamine Mola, ENT. Ms. Vajda reports that she first noticed a change in her voice in March of 2015 and assumed it was related to allergies, but it never went away. She saw Dr. Benjamine Mola, ENT who referred her to [facility] where per pt report she saw a therapist there who recommended voice therapy. Pt was unable to travel that distance and then went to see Dr. Redmond Pulling (ENT) who suggested voice therapy at [facility].  Ms. Guyett presents with mild vocal dysphonia characterized by hoarse/harsh vocal quality with decreased breath support. She reports that her symptoms are worse in the morning and seem to get better throughout the day. Recommend voice therapy for vocal hygiene techniques, breath support, vocal  warm up exercises, and compensatory strategies to maximize vocal function.    Treatment Provided   Treatment provided Cognitive-Linquistic   Pain Assessment   Pain Assessment No/denies pain   Cognitive-Linquistic Treatment   Treatment focused on Voice;Patient/family/caregiver education   Skilled Treatment vocal hygiene education, breath/body positioning, vocal exercises, compensatory strategies   Assessment / Recommendations / Plan   Plan Continue with current plan of care   Progression Toward Goals   Progression toward goals Progressing toward goals            SLP Short Term Goals - 10/25/14 1411    SLP SHORT TERM GOAL #1   Title Pt will implement vocal hygiene techniques after initial review and use of written reminders per pt report.   Baseline No knowledge of techniques   Time 6   Period Weeks   Status On-going   SLP SHORT TERM GOAL #2   Title Pt will implement vocal warm up  exercises with use of written cue prior to discharge from therapy.   Baseline None   Time 6   Period Weeks   Status On-going          SLP Long Term Goals - 10/25/14 1411    SLP LONG TERM GOAL #1   Title Pt will improve self rating of vocal quality from very poor to average with use of strategies and techniques.   Baseline very poor   Time 6   Period Weeks   Status On-going          Plan - 10/25/14 1406    Clinical Impression Statement Ms. Gagliardo has a difficult time completing vocal warm up, breathing, and voice exercises. She reports occasionally completing exercises at home, but it doesn't sound very consistent. I again wrote a very specific, but basic plan for exercises to complete 3x per day. She really enjoys conversing and it is challenging to redirect to task. Will see one more session next week and then plan for discharge from therapy.   Speech Therapy Frequency 1x /week   Duration 1 week  6 weeks   Treatment/Interventions SLP instruction and feedback;Compensatory strategies;Patient/family education   Potential to Achieve Goals Good   Potential Considerations Other (comment)  time since onset (>one year)   SLP Home Exercise Plan Pt will complete HEP as assigned to facilitate carryover of treatment strategies and techniques in home/community environment.   Consulted and Agree with Plan of Care Patient        Problem List Patient Active Problem List   Diagnosis Date Noted  . Acute blood loss anemia 09/10/2014  . GIB (gastrointestinal bleeding) 09/08/2014  . Occlusion and stenosis of carotid artery without mention of cerebral infarction 12/14/2013  . Osteoarthritis of left knee 04/23/2013  . Thumb pain 10/22/2012  . De Quervain's syndrome (tenosynovitis) 10/22/2012  . [facility] DJD(carpometacarpal degenerative joint disease), localized primary 10/22/2012  . CTS (carpal tunnel syndrome) 10/22/2012  . Trigger thumb of right hand 10/22/2012  . Pes anserinus bursitis of  right knee 06/26/2012  . OA (osteoarthritis) of knee 06/26/2012  . Leg pain, left 06/26/2012  . Radicular pain of left lower extremity 06/26/2012  . Diverticulosis of colon with hemorrhage 09/22/2011  . Syncopal episodes 09/20/2011  . GI bleed 09/19/2011  . HTN (hypertension) 09/19/2011  . Anemia due to blood loss, acute 09/19/2011  . Abnormality of gait 01/03/2011  . Personal history of fall 12/27/2010  . DISLOCATION CLOSED SHOULDER NEC 04/25/2010  . ANSERINE BURSITIS, LEFT 02/13/2010  .  KNEE PAIN 12/22/2009  . LEG PAIN, BILATERAL 12/22/2009  . TOTAL KNEE FOLLOW-UP 12/22/2009  . HIP PAIN 02/07/2009  . LOW BACK PAIN 02/07/2009   Thank you,  Genene Churn, [place] Better  [facility] 10/25/2014, 2:34 PM  [place] [address] [place], [place], [address] Phone: [phone]   Fax:  [phone]

## 2014-10-26 ENCOUNTER — Ambulatory Visit (HOSPITAL_COMMUNITY): Payer: PRIVATE HEALTH INSURANCE | Admitting: Speech Pathology

## 2014-11-01 ENCOUNTER — Ambulatory Visit (HOSPITAL_COMMUNITY): Payer: Medicare Other | Admitting: Speech Pathology

## 2014-11-01 DIAGNOSIS — R490 Dysphonia: Secondary | ICD-10-CM | POA: Diagnosis not present

## 2014-11-01 NOTE — Therapy (Signed)
[place], [place], [address] Phone: [phone]   Fax:  [phone]  Speech Language Pathology Treatment  Patient Details  Name: Cindy Schultz MRN: [id number] Date of Birth: Nov 29, 1923 Referring Provider:  Sinda Du, MD  Encounter Date: 11/01/2014      End of Session - 11/01/14 1422    Visit Number 7   Number of Visits 7   Date for SLP Re-Evaluation 11/16/14   Authorization Type Medicare   Authorization Time Period 09/20/2014-11/16/2014   Authorization - Visit Number 7   Authorization - Number of Visits 7   SLP Start Time 4287   SLP Stop Time  1430   SLP Time Calculation (min) 42 min   Activity Tolerance Patient tolerated treatment well      Past Medical History  Diagnosis Date  . Macular degeneration   . Glaucoma   . Precancerous changes of the cervix     lesions  . Rectocele   . Diverticulitis   . Lumbar spine pain   . Pain in joints   . Hearing loss   . HTN (hypertension)     cholesterol  . Lymphedema   . DVT (deep venous thrombosis) 07/14/10    LEA doppler   . SOB (shortness of breath) 04/04/2010    2D echo EF=>55%    Past Surgical History  Procedure Laterality Date  . Salk    . Tracheotomy    . Fallen uterus    . Ovarian cyst surgery    . Bladder tac    . Hemmorrhoidectomy    . Met osteostomy    . Left total knee replacement  05/02/04    [name]  . Morton's nueroma    . Elevated metatarsal arch    . Rupture rt groin    . Appendix    . Rectocele repair    . Corneal erosion      cataracts   . Colonoscopy  09/20/2011    Procedure: COLONOSCOPY;  Surgeon: Beryle Beams, MD;  Location: [facility] ENDOSCOPY;  Service: Endoscopy;  Laterality: N/A;  . Abdominal hysterectomy      There were no vitals filed for this visit.  Visit Diagnosis: Dysphonia      Subjective Assessment - 11/01/14 1421    Subjective "I've been talking to myself!"   Currently in Pain? No/denies                ADULT SLP TREATMENT - 11/01/14 1421    General Information   Behavior/Cognition Alert;Cooperative   Patient Positioning Upright in chair   Oral care provided N/A   HPI Ms. Cindy Schultz is an 81 yo woman who was referred by Dr. Luan Pulling for voice therapy following an evaluation with Dr. Redmond Pulling and Dr. Benjamine Mola, ENT. Ms. Cindy Schultz reports that she first noticed a change in her voice in March of 2015 and assumed it was related to allergies, but it never went away. She saw Dr. Benjamine Mola, ENT who referred her to [facility] where per pt report she saw a therapist there who recommended voice therapy. Pt was unable to travel that distance and then went to see Dr. Redmond Pulling (ENT) who suggested voice therapy at [facility].  Ms. Cindy Schultz presents with mild vocal dysphonia characterized by hoarse/harsh vocal quality with decreased breath support. She reports that her symptoms are worse in the morning and seem to get better throughout the day. Recommend voice therapy for vocal hygiene techniques, breath support, vocal warm up  exercises, and compensatory strategies to maximize vocal function.    Treatment Provided   Treatment provided Cognitive-Linquistic   Pain Assessment   Pain Assessment No/denies pain   Cognitive-Linquistic Treatment   Treatment focused on Voice;Patient/family/caregiver education   Skilled Treatment vocal hygiene education, breath/body positioning, vocal exercises, compensatory strategies   Assessment / Recommendations / Plan   Plan Discharge SLP treatment due to (comment);All goals met  plateau in progress          SLP Education - Nov 03, 2014 1422    Education provided Yes   Education Details Review of HEP for voice   Person(s) Educated Patient   Methods Explanation;Demonstration;Handout   Comprehension Verbalized understanding          SLP Short Term Goals - 11-03-2014 1512    SLP SHORT TERM GOAL #1   Title Pt will implement vocal hygiene techniques after initial review and  use of written reminders per pt report.   Baseline No knowledge of techniques   Time 6   Period Weeks   Status Achieved   SLP SHORT TERM GOAL #2   Title Pt will implement vocal warm up exercises with use of written cue prior to discharge from therapy.   Baseline None   Time 6   Period Weeks   Status Achieved          SLP Long Term Goals - Nov 03, 2014 1513    SLP LONG TERM GOAL #1   Title Pt will improve self rating of vocal quality from very poor to average with use of strategies and techniques.   Baseline very poor   Time 6   Period Weeks   Status Achieved          Plan - 11-03-2014 1423    Clinical Impression Statement Ms. Cindy Schultz was seen for her final voice therapy session today. Her memory difficulties make carryover of strategies a challenge. When asked if she feels her voice is improved, she states that she "can't really tell" because of her hearing aids. She has very few people to talk to, but does attempt to get out and visit friends in nursing homes and talk to her daughter on the phone. She was encouraged to continue with her breathing and vocal warm up exercises every morning and to focus on intensity/loudness in her conversational speech. Handouts were reviewed and pt encouraged to call SLP if she should have any questions after discharge. Overall voice quality at discharge is judged to be mildly improved as noted by improved loudness and decreased glottal fry.   Speech Therapy Frequency --   Duration --   Treatment/Interventions SLP instruction and feedback;Compensatory strategies;Patient/family education   Potential to Achieve Goals Good   Potential Considerations Other (comment)  time since onset (>one year)   SLP Home Exercise Plan Pt will complete HEP as assigned to facilitate carryover of treatment strategies and techniques in home/community environment.   Consulted and Agree with Plan of Care Patient          G-Codes - Nov 03, 2014 1513    Functional Assessment  Tool Used clinical judgement   Functional Limitations Voice   Voice Goal Status [phone]) At least 1 percent but less than 20 percent impaired, limited or restricted   Voice Discharge Status [phone]) At least 1 percent but less than 20 percent impaired, limited or restricted      Problem List Patient Active Problem List   Diagnosis Date Noted  . Acute blood loss anemia 09/10/2014  . GIB (gastrointestinal  bleeding) 09/08/2014  . Occlusion and stenosis of carotid artery without mention of cerebral infarction 12/14/2013  . Osteoarthritis of left knee 04/23/2013  . Thumb pain 10/22/2012  . De Quervain's syndrome (tenosynovitis) 10/22/2012  . [facility] DJD(carpometacarpal degenerative joint disease), localized primary 10/22/2012  . CTS (carpal tunnel syndrome) 10/22/2012  . Trigger thumb of right hand 10/22/2012  . Pes anserinus bursitis of right knee 06/26/2012  . OA (osteoarthritis) of knee 06/26/2012  . Leg pain, left 06/26/2012  . Radicular pain of left lower extremity 06/26/2012  . Diverticulosis of colon with hemorrhage 09/22/2011  . Syncopal episodes 09/20/2011  . GI bleed 09/19/2011  . HTN (hypertension) 09/19/2011  . Anemia due to blood loss, acute 09/19/2011  . Abnormality of gait 01/03/2011  . Personal history of fall 12/27/2010  . DISLOCATION CLOSED SHOULDER NEC 04/25/2010  . ANSERINE BURSITIS, LEFT 02/13/2010  . KNEE PAIN 12/22/2009  . LEG PAIN, BILATERAL 12/22/2009  . TOTAL KNEE FOLLOW-UP 12/22/2009  . HIP PAIN 02/07/2009  . LOW BACK PAIN 02/07/2009    SPEECH THERAPY DISCHARGE SUMMARY  Visits from Start of Care: 7  Current functional level related to goals / functional outcomes: Mild impairment   Remaining deficits: Mild decreased breath support negatively impacting vocal intensity   Education / Equipment: completed  Plan: Patient agrees to discharge.  Patient goals were met. Patient is being discharged due to meeting the stated rehab goals.  ?????         Thank you,  Genene Churn, [place]  [facility] 11/01/2014, 3:14 PM  [place], [place], [address] Phone: [phone]   Fax:  [phone]

## 2014-11-08 ENCOUNTER — Ambulatory Visit (HOSPITAL_COMMUNITY): Payer: PRIVATE HEALTH INSURANCE | Admitting: Speech Pathology

## 2014-11-08 DIAGNOSIS — M1990 Unspecified osteoarthritis, unspecified site: Secondary | ICD-10-CM | POA: Diagnosis not present

## 2014-11-08 DIAGNOSIS — I10 Essential (primary) hypertension: Secondary | ICD-10-CM | POA: Diagnosis not present

## 2014-11-08 DIAGNOSIS — K922 Gastrointestinal hemorrhage, unspecified: Secondary | ICD-10-CM | POA: Diagnosis not present

## 2014-11-08 DIAGNOSIS — R490 Dysphonia: Secondary | ICD-10-CM | POA: Diagnosis not present

## 2014-11-29 DIAGNOSIS — R05 Cough: Secondary | ICD-10-CM | POA: Diagnosis not present

## 2014-12-10 ENCOUNTER — Other Ambulatory Visit: Payer: Self-pay | Admitting: *Deleted

## 2014-12-10 DIAGNOSIS — I6523 Occlusion and stenosis of bilateral carotid arteries: Secondary | ICD-10-CM

## 2014-12-23 ENCOUNTER — Encounter: Payer: Self-pay | Admitting: Family

## 2014-12-24 ENCOUNTER — Ambulatory Visit (INDEPENDENT_AMBULATORY_CARE_PROVIDER_SITE_OTHER): Payer: Medicare Other | Admitting: Ophthalmology

## 2014-12-24 DIAGNOSIS — H35371 Puckering of macula, right eye: Secondary | ICD-10-CM

## 2014-12-24 DIAGNOSIS — I10 Essential (primary) hypertension: Secondary | ICD-10-CM

## 2014-12-24 DIAGNOSIS — H35033 Hypertensive retinopathy, bilateral: Secondary | ICD-10-CM | POA: Diagnosis not present

## 2014-12-24 DIAGNOSIS — H3531 Nonexudative age-related macular degeneration: Secondary | ICD-10-CM

## 2014-12-24 DIAGNOSIS — H43813 Vitreous degeneration, bilateral: Secondary | ICD-10-CM

## 2014-12-27 ENCOUNTER — Ambulatory Visit (INDEPENDENT_AMBULATORY_CARE_PROVIDER_SITE_OTHER): Payer: Medicare Other | Admitting: Family

## 2014-12-27 ENCOUNTER — Ambulatory Visit (HOSPITAL_COMMUNITY)
Admission: RE | Admit: 2014-12-27 | Discharge: 2014-12-27 | Disposition: A | Discharge status: Home / Self Care | Payer: Medicare Other | Source: Ambulatory Visit | Attending: Family | Admitting: Family

## 2014-12-27 ENCOUNTER — Encounter: Payer: Self-pay | Admitting: Family

## 2014-12-27 VITALS — BP 108/67 | HR 59 | Resp 14 | Ht 63.0 in | Wt 132.0 lb

## 2014-12-27 DIAGNOSIS — I6523 Occlusion and stenosis of bilateral carotid arteries: Secondary | ICD-10-CM

## 2014-12-27 NOTE — Addendum Note (Signed)
Addended by: Dorthula Rue L on: 12/27/2014 02:13 PM   Modules accepted: Orders

## 2014-12-27 NOTE — Patient Instructions (Signed)
Stroke Prevention Some medical conditions and behaviors are associated with an increased chance of having a stroke. You may prevent a stroke by making healthy choices and managing medical conditions. HOW CAN I REDUCE MY RISK OF HAVING A STROKE?   Stay physically active. Get at least 30 minutes of activity on most or all days.  Do not smoke. It may also be helpful to avoid exposure to secondhand smoke.  Limit alcohol use. Moderate alcohol use is considered to be:  No more than 2 drinks per day for men.  No more than 1 drink per day for nonpregnant women.  Eat healthy foods. This involves:  Eating 5 or more servings of fruits and vegetables a day.  Making dietary changes that address high blood pressure (hypertension), high cholesterol, diabetes, or obesity.  Manage your cholesterol levels.  Making food choices that are high in fiber and low in saturated fat, trans fat, and cholesterol may control cholesterol levels.  Take any prescribed medicines to control cholesterol as directed by your health care provider.  Manage your diabetes.  Controlling your carbohydrate and sugar intake is recommended to manage diabetes.  Take any prescribed medicines to control diabetes as directed by your health care provider.  Control your hypertension.  Making food choices that are low in salt (sodium), saturated fat, trans fat, and cholesterol is recommended to manage hypertension.  Take any prescribed medicines to control hypertension as directed by your health care provider.  Maintain a healthy weight.  Reducing calorie intake and making food choices that are low in sodium, saturated fat, trans fat, and cholesterol are recommended to manage weight.  Stop drug abuse.  Avoid taking birth control pills.  Talk to your health care provider about the risks of taking birth control pills if you are over 35 years old, smoke, get migraines, or have ever had a blood clot.  Get evaluated for sleep  disorders (sleep apnea).  Talk to your health care provider about getting a sleep evaluation if you snore a lot or have excessive sleepiness.  Take medicines only as directed by your health care provider.  For some people, aspirin or blood thinners (anticoagulants) are helpful in reducing the risk of forming abnormal blood clots that can lead to stroke. If you have the irregular heart rhythm of atrial fibrillation, you should be on a blood thinner unless there is a good reason you cannot take them.  Understand all your medicine instructions.  Make sure that other conditions (such as anemia or atherosclerosis) are addressed. SEEK IMMEDIATE MEDICAL CARE IF:   You have sudden weakness or numbness of the face, arm, or leg, especially on one side of the body.  Your face or eyelid droops to one side.  You have sudden confusion.  You have trouble speaking (aphasia) or understanding.  You have sudden trouble seeing in one or both eyes.  You have sudden trouble walking.  You have dizziness.  You have a loss of balance or coordination.  You have a sudden, severe headache with no known cause.  You have new chest pain or an irregular heartbeat. Any of these symptoms may represent a serious problem that is an emergency. Do not wait to see if the symptoms will go away. Get medical help at once. Call your local emergency services (911 in [place] Do not drive yourself to the hospital. Document Released: 07/12/2004 Document Revised: 10/19/2013 Document Reviewed: 12/05/2012 [employer] Patient Information 2015 [employer]. This information is not intended to replace advice given   to you by your health care provider. Make sure you discuss any questions you have with your health care provider.  

## 2014-12-27 NOTE — Progress Notes (Signed)
Established Carotid Patient   History of Present Illness  Cindy Schultz is a 81 y.o. female patient whom Dr. Trula Slade initially evaluated in June 2015. She was referred for carotid plaque. The patient has a history of hoarseness as well as a sinus disease. She underwent a CT scan which revealed a severely calcified plaque at the left carotid bifurcation. The patient denies a history of symptoms of a stroke or TIA. Specifically she denies numbness or weakness in either extremity. She denies slurred speech. She denies amaurosis fugax. Based on the ultrasound at the patient's June 2015 visit,  Dr. Trula Slade did not think there was a hemodynamically significant stenosis at the left common carotid artery where the plaque was identified. Dr. Trula Slade did  think she should be followed on an annual basis to make sure that these velocity profiles remained consistent. Certainly should she develop symptoms, she may require a more invasive test such as a CT angiogram or catheter-based angiography.Dr. Trula Slade did not think either was required at that time as she remained asymptomatic. Greater than 80% stenosis would be the threshold for intervention.  Patient has a history of DVT which was treated with 6 months of Coumadin. She is medically managed for hypercholesterolemia and hypertension. She is a nonsmoker.  Patient has not had previous carotid artery intervention.  The patient reports New Medical or Surgical History: had speech therapy for her hoarseness. She saw someone in her PCP's office recently for congestion, and will see her PCP in a couple of weeks; she denies dyspnea, since she stopped the lisinopril on medical advice her cough has subsided, still feels like she has some congestion.    In 2013 she had a vaginal hysterectomy for uterine prolapse and left inguinal herniorrhaphy.    She denies claudication symptoms with walking, but does have right leg cramping at night.  Pt Diabetic:  no Pt smoker: non-smoker  Pt meds include: Statin : no, states she had too much hair loss with a statin use, is now taking an OTC supplement for her cholesterol and she states her cholesterol has been OK. ASA: no, she cannot take ASA due to history of GI bleed from her colon Other anticoagulants/antiplatelets: no   Past Medical History  Diagnosis Date  . Macular degeneration   . Glaucoma   . Precancerous changes of the cervix     lesions  . Rectocele   . Diverticulitis   . Lumbar spine pain   . Pain in joints   . Hearing loss   . HTN (hypertension)     cholesterol  . Lymphedema   . DVT (deep venous thrombosis) 07/14/10    LEA doppler   . SOB (shortness of breath) 04/04/2010    2D echo EF=>55%    Social History History  Substance Use Topics  . Smoking status: Never Smoker   . Smokeless tobacco: Not on file  . Alcohol Use: No    Family History Family History  Problem Relation Age of Onset  . Stroke Mother   . Diabetes Mother   . Heart attack Father   . COPD Paternal Grandfather   . Dementia Sister     Surgical History Past Surgical History  Procedure Laterality Date  . Salk    . Tracheotomy    . Fallen uterus    . Ovarian cyst surgery    . Bladder tac    . Hemmorrhoidectomy    . Met osteostomy    . Left total knee replacement  05/02/04  [name]  . Morton's nueroma    . Elevated metatarsal arch    . Rupture rt groin    . Appendix    . Rectocele repair    . Corneal erosion      cataracts   . Colonoscopy  09/20/2011    Procedure: COLONOSCOPY;  Surgeon: Beryle Beams, MD;  Location: [facility] ENDOSCOPY;  Service: Endoscopy;  Laterality: N/A;  . Abdominal hysterectomy      Allergies  Allergen Reactions  . Codeine Nausea And Vomiting  . Hydrocodone Other (See Comments)    Unknown Reaction   . Promethazine Hcl     REACTION: deathly sick  . Tramadol Hcl Other (See Comments)    Motion Sickness    Current Outpatient Prescriptions  Medication Sig  Dispense Refill  . acetaminophen (TYLENOL) 500 MG tablet Take 500 mg by mouth every 6 (six) hours as needed (pain).     . Calcium 600-200 MG-UNIT per tablet Take 1 tablet by mouth daily.      . dorzolamide-timolol (COSOPT) 22.3-6.8 MG/ML ophthalmic solution Place 1 drop into both eyes 2 (two) times daily.      . ferrous sulfate 325 (65 FE) MG tablet Take 325 mg by mouth daily with breakfast.    . fluticasone (FLONASE) 50 MCG/ACT nasal spray Place 2 sprays into the nose daily.      Marland Kitchen gabapentin (NEURONTIN) 100 MG capsule Take 1 capsule (100 mg total) by mouth at bedtime. 30 capsule 5  . glucosamine-chondroitin 500-400 MG tablet Take 1 tablet by mouth daily.     Marland Kitchen latanoprost (XALATAN) 0.005 % ophthalmic solution Place 1 drop into the right eye at bedtime.     Marland Kitchen lisinopril (PRINIVIL,ZESTRIL) 20 MG tablet Take 1 tablet by mouth daily as needed (when blood pressure is high).     . Multiple Vitamins-Minerals (OCUVITE PO) Take 1 tablet by mouth daily.    Marland Kitchen NIFEdipine (PROCARDIA-XL/ADALAT CC) 30 MG 24 hr tablet Take 30 mg by mouth daily.     Marland Kitchen oxybutynin (DITROPAN-XL) 5 MG 24 hr tablet Take 5 mg by mouth daily.    Vladimir Faster Glycol-Propyl Glycol (SYSTANE ULTRA OP) Apply 1 drop to eye 3 (three) times daily.      No current facility-administered medications for this visit.    Review of Systems : See HPI for pertinent positives and negatives.  Physical Examination  Filed Vitals:   12/27/14 1041 12/27/14 1044  BP: 119/79 108/67  Pulse: 58 59  Resp:  14  Height:  '5\' 3"'  (1.6 m)  Weight:  132 lb (59.875 kg)  SpO2:  97%   Body mass index is 23.39 kg/(m^2).   General: WDWN female in NAD GAIT: normal Eyes: PERRLA Pulmonary:  Non-labored, CTAB, Positive rales in left base, Negative rhonchi, & Negative wheezing.  Cardiac: regular rhythm, no detected murmur.  VASCULAR EXAM Carotid Bruits Right Left   Negative Negative    Aorta is not palpable. Radial pulses are 2+ palpable and equal.  LE Pulses Right Left       POPLITEAL  not palpable   not palpable    Gastrointestinal: soft, nontender, BS WNL, no r/g,  no palpable masses.  Musculoskeletal: age appropriate muscle atrophy/wasting. M/S 4/5 throughout, extremities without ischemic changes.  Neurologic: A&O X 3; Appropriate Affect, Speech is normal CN 2-12 intact except is hard of hearing, pain and light touch intact in extremities, Motor exam as listed above.   Non-Invasive Vascular Imaging CAROTID DUPLEX 12/27/2014   Right ICA: <40% stenosis. Left ICA: <40% stenosis.  Previous carotid studies demonstrated: RICA <09% stenosis, LICA <62% stenosis.  These findings are unchanged from previous exam.    Assessment: Cindy Schultz is a 81 y.o. female who has no history of stroke or TIA. Carotid stenosis was found on CT imaging as part of an evaluation for hoarseness and sinus disease. Today's carotid Duplex suggests minimal bilateral internal carotid artery stenosis. No significant change in comparison to the last exam on 12/14/2013.   Pt advised to let her PCP know if her cough or shortness of breath worsens.  Plan: Follow-up in 1 year with Carotid Duplex.   I discussed in depth with the patient the nature of atherosclerosis, and emphasized the importance of maximal medical management including strict control of blood pressure, blood glucose, and lipid levels, obtaining regular exercise, and continued cessation of smoking.  The patient is aware that without maximal medical management the underlying atherosclerotic disease process will progress, limiting the benefit of any interventions. The patient was given information about stroke prevention and what symptoms should prompt the patient to seek immediate medical care. Thank you for allowing [place] to participate in this patient's care.  Clemon Chambers,  RN, MSN, FNP-C [facility] Office: [facility] Physician: Trula Slade  12/27/2014 10:35 AM

## 2015-01-05 ENCOUNTER — Ambulatory Visit (HOSPITAL_COMMUNITY)
Admission: RE | Admit: 2015-01-05 | Discharge: 2015-01-05 | Disposition: A | Discharge status: Home / Self Care | Payer: Medicare Other | Source: Ambulatory Visit | Attending: Pulmonary Disease | Admitting: Pulmonary Disease

## 2015-01-05 ENCOUNTER — Other Ambulatory Visit (HOSPITAL_COMMUNITY): Payer: Self-pay | Admitting: Pulmonary Disease

## 2015-01-05 DIAGNOSIS — I10 Essential (primary) hypertension: Secondary | ICD-10-CM | POA: Diagnosis not present

## 2015-01-05 DIAGNOSIS — M79661 Pain in right lower leg: Secondary | ICD-10-CM | POA: Diagnosis not present

## 2015-01-05 DIAGNOSIS — J209 Acute bronchitis, unspecified: Secondary | ICD-10-CM | POA: Diagnosis not present

## 2015-01-05 DIAGNOSIS — R609 Edema, unspecified: Secondary | ICD-10-CM | POA: Diagnosis not present

## 2015-01-05 DIAGNOSIS — M79604 Pain in right leg: Secondary | ICD-10-CM | POA: Diagnosis not present

## 2015-01-05 DIAGNOSIS — M7989 Other specified soft tissue disorders: Secondary | ICD-10-CM | POA: Insufficient documentation

## 2015-01-06 DIAGNOSIS — H4011X4 Primary open-angle glaucoma, indeterminate stage: Secondary | ICD-10-CM | POA: Diagnosis not present

## 2015-01-06 DIAGNOSIS — H3530 Unspecified macular degeneration: Secondary | ICD-10-CM | POA: Diagnosis not present

## 2015-01-12 DIAGNOSIS — R609 Edema, unspecified: Secondary | ICD-10-CM | POA: Diagnosis not present

## 2015-01-12 DIAGNOSIS — I10 Essential (primary) hypertension: Secondary | ICD-10-CM | POA: Diagnosis not present

## 2015-01-12 DIAGNOSIS — J209 Acute bronchitis, unspecified: Secondary | ICD-10-CM | POA: Diagnosis not present

## 2015-01-25 DIAGNOSIS — N898 Other specified noninflammatory disorders of vagina: Secondary | ICD-10-CM | POA: Diagnosis not present

## 2015-01-28 ENCOUNTER — Ambulatory Visit: Payer: Medicare Other | Admitting: Urology

## 2015-02-08 DIAGNOSIS — L209 Atopic dermatitis, unspecified: Secondary | ICD-10-CM | POA: Diagnosis not present

## 2015-02-23 ENCOUNTER — Other Ambulatory Visit: Payer: Self-pay | Admitting: Orthopedic Surgery

## 2015-02-23 DIAGNOSIS — E785 Hyperlipidemia, unspecified: Secondary | ICD-10-CM | POA: Diagnosis not present

## 2015-02-23 DIAGNOSIS — M1990 Unspecified osteoarthritis, unspecified site: Secondary | ICD-10-CM | POA: Diagnosis not present

## 2015-02-23 DIAGNOSIS — I10 Essential (primary) hypertension: Secondary | ICD-10-CM | POA: Diagnosis not present

## 2015-02-23 DIAGNOSIS — R785 Finding of other psychotropic drug in blood: Secondary | ICD-10-CM | POA: Diagnosis not present

## 2015-02-23 DIAGNOSIS — F329 Major depressive disorder, single episode, unspecified: Secondary | ICD-10-CM | POA: Diagnosis not present

## 2015-02-23 DIAGNOSIS — R490 Dysphonia: Secondary | ICD-10-CM | POA: Diagnosis not present

## 2015-02-28 DIAGNOSIS — R809 Proteinuria, unspecified: Secondary | ICD-10-CM | POA: Diagnosis not present

## 2015-02-28 DIAGNOSIS — R32 Unspecified urinary incontinence: Secondary | ICD-10-CM | POA: Diagnosis not present

## 2015-02-28 DIAGNOSIS — I10 Essential (primary) hypertension: Secondary | ICD-10-CM | POA: Diagnosis not present

## 2015-02-28 DIAGNOSIS — N183 Chronic kidney disease, stage 3 (moderate): Secondary | ICD-10-CM | POA: Diagnosis not present

## 2015-03-01 DIAGNOSIS — H16223 Keratoconjunctivitis sicca, not specified as Sjogren's, bilateral: Secondary | ICD-10-CM | POA: Diagnosis not present

## 2015-03-01 DIAGNOSIS — H3530 Unspecified macular degeneration: Secondary | ICD-10-CM | POA: Diagnosis not present

## 2015-03-14 DIAGNOSIS — M9903 Segmental and somatic dysfunction of lumbar region: Secondary | ICD-10-CM | POA: Diagnosis not present

## 2015-03-14 DIAGNOSIS — M5136 Other intervertebral disc degeneration, lumbar region: Secondary | ICD-10-CM | POA: Diagnosis not present

## 2015-03-14 DIAGNOSIS — M5032 Other cervical disc degeneration, mid-cervical region: Secondary | ICD-10-CM | POA: Diagnosis not present

## 2015-03-14 DIAGNOSIS — M9901 Segmental and somatic dysfunction of cervical region: Secondary | ICD-10-CM | POA: Diagnosis not present

## 2015-03-15 ENCOUNTER — Other Ambulatory Visit (HOSPITAL_COMMUNITY): Payer: Self-pay | Admitting: Nephrology

## 2015-03-15 DIAGNOSIS — N183 Chronic kidney disease, stage 3 unspecified: Secondary | ICD-10-CM

## 2015-03-16 DIAGNOSIS — N3281 Overactive bladder: Secondary | ICD-10-CM | POA: Diagnosis not present

## 2015-03-16 DIAGNOSIS — M1990 Unspecified osteoarthritis, unspecified site: Secondary | ICD-10-CM | POA: Diagnosis not present

## 2015-03-16 DIAGNOSIS — H409 Unspecified glaucoma: Secondary | ICD-10-CM | POA: Diagnosis not present

## 2015-03-16 DIAGNOSIS — I10 Essential (primary) hypertension: Secondary | ICD-10-CM | POA: Diagnosis not present

## 2015-03-30 ENCOUNTER — Ambulatory Visit (HOSPITAL_COMMUNITY): Payer: Medicare Other

## 2015-03-30 ENCOUNTER — Ambulatory Visit (HOSPITAL_COMMUNITY)
Admission: RE | Admit: 2015-03-30 | Discharge: 2015-03-30 | Disposition: A | Discharge status: Home / Self Care | Payer: Medicare Other | Source: Ambulatory Visit | Attending: Nephrology | Admitting: Nephrology

## 2015-03-30 DIAGNOSIS — N189 Chronic kidney disease, unspecified: Secondary | ICD-10-CM | POA: Diagnosis not present

## 2015-03-30 DIAGNOSIS — Z79899 Other long term (current) drug therapy: Secondary | ICD-10-CM | POA: Diagnosis not present

## 2015-03-30 DIAGNOSIS — N183 Chronic kidney disease, stage 3 unspecified: Secondary | ICD-10-CM

## 2015-03-30 DIAGNOSIS — R809 Proteinuria, unspecified: Secondary | ICD-10-CM | POA: Diagnosis not present

## 2015-03-30 DIAGNOSIS — D649 Anemia, unspecified: Secondary | ICD-10-CM | POA: Diagnosis not present

## 2015-03-30 DIAGNOSIS — D509 Iron deficiency anemia, unspecified: Secondary | ICD-10-CM | POA: Diagnosis not present

## 2015-03-30 DIAGNOSIS — I10 Essential (primary) hypertension: Secondary | ICD-10-CM | POA: Diagnosis not present

## 2015-03-30 DIAGNOSIS — D519 Vitamin B12 deficiency anemia, unspecified: Secondary | ICD-10-CM | POA: Diagnosis not present

## 2015-03-30 DIAGNOSIS — E559 Vitamin D deficiency, unspecified: Secondary | ICD-10-CM | POA: Diagnosis not present

## 2015-04-01 DIAGNOSIS — H52221 Regular astigmatism, right eye: Secondary | ICD-10-CM | POA: Diagnosis not present

## 2015-04-01 DIAGNOSIS — H401132 Primary open-angle glaucoma, bilateral, moderate stage: Secondary | ICD-10-CM | POA: Diagnosis not present

## 2015-04-01 DIAGNOSIS — H5213 Myopia, bilateral: Secondary | ICD-10-CM | POA: Diagnosis not present

## 2015-04-01 DIAGNOSIS — H524 Presbyopia: Secondary | ICD-10-CM | POA: Diagnosis not present

## 2015-04-12 DIAGNOSIS — H16223 Keratoconjunctivitis sicca, not specified as Sjogren's, bilateral: Secondary | ICD-10-CM | POA: Diagnosis not present

## 2015-04-12 DIAGNOSIS — H353132 Nonexudative age-related macular degeneration, bilateral, intermediate dry stage: Secondary | ICD-10-CM | POA: Diagnosis not present

## 2015-04-12 DIAGNOSIS — H401134 Primary open-angle glaucoma, bilateral, indeterminate stage: Secondary | ICD-10-CM | POA: Diagnosis not present

## 2015-04-18 DIAGNOSIS — I10 Essential (primary) hypertension: Secondary | ICD-10-CM | POA: Diagnosis not present

## 2015-04-18 DIAGNOSIS — Z0000 Encounter for general adult medical examination without abnormal findings: Secondary | ICD-10-CM | POA: Diagnosis not present

## 2015-04-18 DIAGNOSIS — N183 Chronic kidney disease, stage 3 (moderate): Secondary | ICD-10-CM | POA: Diagnosis not present

## 2015-04-18 DIAGNOSIS — R809 Proteinuria, unspecified: Secondary | ICD-10-CM | POA: Diagnosis not present

## 2015-04-18 DIAGNOSIS — E559 Vitamin D deficiency, unspecified: Secondary | ICD-10-CM | POA: Diagnosis not present

## 2015-05-16 DIAGNOSIS — M170 Bilateral primary osteoarthritis of knee: Secondary | ICD-10-CM | POA: Diagnosis not present

## 2015-05-16 DIAGNOSIS — M1711 Unilateral primary osteoarthritis, right knee: Secondary | ICD-10-CM | POA: Diagnosis not present

## 2015-05-16 DIAGNOSIS — R262 Difficulty in walking, not elsewhere classified: Secondary | ICD-10-CM | POA: Diagnosis not present

## 2015-05-16 DIAGNOSIS — M25561 Pain in right knee: Secondary | ICD-10-CM | POA: Diagnosis not present

## 2015-05-23 DIAGNOSIS — R2689 Other abnormalities of gait and mobility: Secondary | ICD-10-CM | POA: Diagnosis not present

## 2015-05-23 DIAGNOSIS — M25561 Pain in right knee: Secondary | ICD-10-CM | POA: Diagnosis not present

## 2015-05-23 DIAGNOSIS — M1711 Unilateral primary osteoarthritis, right knee: Secondary | ICD-10-CM | POA: Diagnosis not present

## 2015-05-25 ENCOUNTER — Other Ambulatory Visit (HOSPITAL_COMMUNITY): Payer: Self-pay | Admitting: Pulmonary Disease

## 2015-05-25 ENCOUNTER — Ambulatory Visit (HOSPITAL_COMMUNITY)
Admission: RE | Admit: 2015-05-25 | Discharge: 2015-05-25 | Disposition: A | Discharge status: Home / Self Care | Payer: Medicare Other | Source: Ambulatory Visit | Attending: Pulmonary Disease | Admitting: Pulmonary Disease

## 2015-05-25 DIAGNOSIS — R05 Cough: Secondary | ICD-10-CM | POA: Diagnosis not present

## 2015-05-25 DIAGNOSIS — M25551 Pain in right hip: Secondary | ICD-10-CM

## 2015-05-25 DIAGNOSIS — M1990 Unspecified osteoarthritis, unspecified site: Secondary | ICD-10-CM | POA: Diagnosis not present

## 2015-05-25 DIAGNOSIS — M1611 Unilateral primary osteoarthritis, right hip: Secondary | ICD-10-CM | POA: Insufficient documentation

## 2015-05-25 DIAGNOSIS — M179 Osteoarthritis of knee, unspecified: Secondary | ICD-10-CM | POA: Diagnosis not present

## 2015-05-25 DIAGNOSIS — I10 Essential (primary) hypertension: Secondary | ICD-10-CM | POA: Diagnosis not present

## 2015-05-25 DIAGNOSIS — K5900 Constipation, unspecified: Secondary | ICD-10-CM | POA: Diagnosis not present

## 2015-05-25 DIAGNOSIS — R059 Cough, unspecified: Secondary | ICD-10-CM

## 2015-05-30 DIAGNOSIS — M7061 Trochanteric bursitis, right hip: Secondary | ICD-10-CM | POA: Diagnosis not present

## 2015-05-30 DIAGNOSIS — R2689 Other abnormalities of gait and mobility: Secondary | ICD-10-CM | POA: Diagnosis not present

## 2015-05-30 DIAGNOSIS — M1711 Unilateral primary osteoarthritis, right knee: Secondary | ICD-10-CM | POA: Diagnosis not present

## 2015-05-30 DIAGNOSIS — G5601 Carpal tunnel syndrome, right upper limb: Secondary | ICD-10-CM | POA: Diagnosis not present

## 2015-05-30 DIAGNOSIS — M25561 Pain in right knee: Secondary | ICD-10-CM | POA: Diagnosis not present

## 2015-05-31 ENCOUNTER — Ambulatory Visit (INDEPENDENT_AMBULATORY_CARE_PROVIDER_SITE_OTHER): Payer: Medicare Other | Admitting: Orthopedic Surgery

## 2015-05-31 ENCOUNTER — Ambulatory Visit (INDEPENDENT_AMBULATORY_CARE_PROVIDER_SITE_OTHER): Payer: Medicare Other

## 2015-05-31 VITALS — BP 124/66 | Ht 63.0 in | Wt 132.0 lb

## 2015-05-31 DIAGNOSIS — Z96653 Presence of artificial knee joint, bilateral: Secondary | ICD-10-CM

## 2015-05-31 DIAGNOSIS — Z96652 Presence of left artificial knee joint: Secondary | ICD-10-CM

## 2015-05-31 DIAGNOSIS — I6523 Occlusion and stenosis of bilateral carotid arteries: Secondary | ICD-10-CM

## 2015-05-31 DIAGNOSIS — M129 Arthropathy, unspecified: Secondary | ICD-10-CM | POA: Diagnosis not present

## 2015-05-31 DIAGNOSIS — M1812 Unilateral primary osteoarthritis of first carpometacarpal joint, left hand: Secondary | ICD-10-CM | POA: Diagnosis not present

## 2015-05-31 MED ORDER — GABAPENTIN 100 MG PO CAPS
100.0000 mg | ORAL_CAPSULE | Freq: Every day | ORAL | Status: DC
Start: 2015-05-31 — End: 2015-11-21

## 2015-05-31 NOTE — Progress Notes (Signed)
Patient ID: Cindy Schultz, female   DOB: [birth year], 81 y.o.   MRN: [id number]  Post op annual TKA   Chief Complaint  Patient presents with  . Follow-up    2 year follow up + xray Left TKA, DOS 04/2004    HPI Cindy Schultz is a 81 y.o. female. The patient is presenting 11 years postop from her left total knee with Stryker implant doing well she has no pain in the implant and no functional deficits  She is complaining of NEW PROBLEM: LEFT THUMB PAIN LOSS OF GRIP no trauma, no neurologic symptoms severe causes her to have difficulty with her ADLs unrelieved by topical medications and Tylenol     Past Medical History  Diagnosis Date  . Macular degeneration   . Glaucoma   . Precancerous changes of the cervix     lesions  . Rectocele   . Diverticulitis   . Lumbar spine pain   . Pain in joints   . Hearing loss   . HTN (hypertension)     cholesterol  . Lymphedema   . DVT (deep venous thrombosis) 07/14/10    LEA doppler   . SOB (shortness of breath) 04/04/2010    2D echo EF=>55%  . Carotid artery occlusion   . Cancer March 2016    Skin Cancer :  Right Cheek     SCC  . Cancer 2013    Skin Cancer  :  Anterior Neck   SCC    Past Surgical History  Procedure Laterality Date  . Salk    . Tracheotomy    . Fallen uterus    . Ovarian cyst surgery    . Bladder tac    . Hemmorrhoidectomy    . Met osteostomy    . Left total knee replacement  05/02/04    [name]  . Morton's nueroma    . Elevated metatarsal arch    . Rupture rt groin    . Appendix    . Rectocele repair    . Corneal erosion      cataracts   . Colonoscopy  09/20/2011    Procedure: COLONOSCOPY;  Surgeon: Beryle Beams, MD;  Location: [facility] ENDOSCOPY;  Service: Endoscopy;  Laterality: N/A;  . Abdominal hysterectomy    . Skin cancer excision Right March 2016  2013    Cheek -  [facility]  and  Anterior Neck     Allergies  Allergen Reactions  . Promethazine Hcl Nausea And Vomiting and Other (See Comments)   REACTION: deathly sick  . Codeine Nausea And Vomiting  . Tramadol Hcl Nausea And Vomiting and Other (See Comments)    Motion Sickness  . Hydrocodone Other (See Comments)    Unknown Reaction     Current Outpatient Prescriptions  Medication Sig Dispense Refill  . acetaminophen (TYLENOL) 500 MG tablet Take 500 mg by mouth every 6 (six) hours as needed (pain).     . Calcium 600-200 MG-UNIT per tablet Take 1 tablet by mouth daily.      . cephALEXin (KEFLEX) 500 MG capsule Take 500 mg by mouth 4 (four) times daily.    . dorzolamide-timolol (COSOPT) 22.3-6.8 MG/ML ophthalmic solution Place 1 drop into both eyes 2 (two) times daily.      . ferrous sulfate 325 (65 FE) MG tablet Take 325 mg by mouth daily with breakfast.    . fluticasone (FLONASE) 50 MCG/ACT nasal spray Place 2 sprays into the nose daily.      Marland Kitchen  glucosamine-chondroitin 500-400 MG tablet Take 1 tablet by mouth daily.     Marland Kitchen latanoprost (XALATAN) 0.005 % ophthalmic solution Place 1 drop into the right eye at bedtime.     Marland Kitchen losartan (COZAAR) 50 MG tablet     . Multiple Vitamins-Minerals (OCUVITE PO) Take 1 tablet by mouth daily.    Marland Kitchen NIFEdipine (PROCARDIA-XL/ADALAT CC) 30 MG 24 hr tablet Take 30 mg by mouth daily.     Marland Kitchen oxybutynin (DITROPAN-XL) 5 MG 24 hr tablet Take 5 mg by mouth daily.    Vladimir Faster Glycol-Propyl Glycol (SYSTANE ULTRA OP) Apply 1 drop to eye 3 (three) times daily.      No current facility-administered medications for this visit.    Review of Systems Review of Systems ROS BACK PAIN NECK STIFF  NEURO DENIES NEURALGIAS   Physical Exam Blood pressure 124/66, height '5\' 3"'  (1.6 m), weight 132 lb (59.875 kg).   Gen. appearance is normal there are no congenital abnormalities   The patient is oriented 3   Mood and affect are normal   Ambulation is without assistive device   Knee inspection reveals a well-healed incision with no swelling   Left Knee flexion 120 estimated without goniometer   Stability in  the anteroposterior plane is normal as well as in the medial lateral plane  Motor exam reveals full extension without extensor lag  Left thumb exam tenderness and crepitus the base of the thumb painful decreased range of motion weakness in pinch subluxation and instability neurovascular intact   Data Reviewed KNEE XRAYS : I interpret her films are stable postop 11 year film of the total knee replacement  Assessment S/P TKA  CMC arthritis left thumb   Plan    Left thumb injection follow-up one year for the x-ray     Inject cmc JOINT left   cmc joint injection Verbal consent was obtained Site marking was confirmed and timeout left cmc joint Medications used: Depo-Medrol 40 mg and lidocaine 1% 3 mL  Injection technique: The skin was prepared with alcohol and ethyl chloride and  the cmc joint on the left  was injected with a [id number]-[id number] needle  No complications were noted   Arther Abbott 05/31/2015, 10:31 AM

## 2015-06-06 DIAGNOSIS — R2689 Other abnormalities of gait and mobility: Secondary | ICD-10-CM | POA: Diagnosis not present

## 2015-06-06 DIAGNOSIS — M25561 Pain in right knee: Secondary | ICD-10-CM | POA: Diagnosis not present

## 2015-06-06 DIAGNOSIS — M1711 Unilateral primary osteoarthritis, right knee: Secondary | ICD-10-CM | POA: Diagnosis not present

## 2015-06-10 DIAGNOSIS — M1711 Unilateral primary osteoarthritis, right knee: Secondary | ICD-10-CM | POA: Diagnosis not present

## 2015-06-10 DIAGNOSIS — M25561 Pain in right knee: Secondary | ICD-10-CM | POA: Diagnosis not present

## 2015-08-04 DIAGNOSIS — M9901 Segmental and somatic dysfunction of cervical region: Secondary | ICD-10-CM | POA: Diagnosis not present

## 2015-08-04 DIAGNOSIS — M542 Cervicalgia: Secondary | ICD-10-CM | POA: Diagnosis not present

## 2015-08-04 DIAGNOSIS — M545 Low back pain: Secondary | ICD-10-CM | POA: Diagnosis not present

## 2015-08-04 DIAGNOSIS — M9903 Segmental and somatic dysfunction of lumbar region: Secondary | ICD-10-CM | POA: Diagnosis not present

## 2015-08-11 DIAGNOSIS — M542 Cervicalgia: Secondary | ICD-10-CM | POA: Diagnosis not present

## 2015-08-11 DIAGNOSIS — M545 Low back pain: Secondary | ICD-10-CM | POA: Diagnosis not present

## 2015-08-11 DIAGNOSIS — M9903 Segmental and somatic dysfunction of lumbar region: Secondary | ICD-10-CM | POA: Diagnosis not present

## 2015-08-11 DIAGNOSIS — M9901 Segmental and somatic dysfunction of cervical region: Secondary | ICD-10-CM | POA: Diagnosis not present

## 2015-08-17 ENCOUNTER — Ambulatory Visit (INDEPENDENT_AMBULATORY_CARE_PROVIDER_SITE_OTHER): Payer: Medicare Other

## 2015-08-17 ENCOUNTER — Ambulatory Visit (INDEPENDENT_AMBULATORY_CARE_PROVIDER_SITE_OTHER): Payer: Medicare Other | Admitting: Orthopedic Surgery

## 2015-08-17 VITALS — BP 134/69 | HR 95 | Ht 63.0 in | Wt 131.8 lb

## 2015-08-17 DIAGNOSIS — M25561 Pain in right knee: Secondary | ICD-10-CM

## 2015-08-17 DIAGNOSIS — M1711 Unilateral primary osteoarthritis, right knee: Secondary | ICD-10-CM

## 2015-08-17 NOTE — Patient Instructions (Signed)
  Ice your knee twice a day  Joint Injection  Care After  Refer to this sheet in the next few days. These instructions provide you with information on caring for yourself after you have had a joint injection. Your caregiver also may give you more specific instructions. Your treatment has been planned according to current medical practices, but problems sometimes occur. Call your caregiver if you have any problems or questions after your procedure.  After any type of joint injection, it is not uncommon to experience:  Soreness, swelling, or bruising around the injection site.  Mild numbness, tingling, or weakness around the injection site caused by the numbing medicine used before or with the injection. It also is possible to experience the following effects associated with the specific agent after injection:  Iodine-based contrast agents:  Allergic reaction (itching, hives, widespread redness, and swelling beyond the injection site).  Corticosteroids (These effects are rare.):  Allergic reaction.  Increased blood sugar levels (If you have diabetes and you notice that your blood sugar levels have increased, notify your caregiver).  Increased blood pressure levels.  Mood swings.  Hyaluronic acid in the use of viscosupplementation.  Temporary heat or redness.  Temporary rash and itching.  Increased fluid accumulation in the injected joint. These effects all should resolve within a day after your procedure.  HOME CARE INSTRUCTIONS  Limit yourself to light activity the day of your procedure. Avoid lifting heavy objects, bending, stooping, or twisting.  Take prescription or over-the-counter pain medication as directed by your caregiver.  You may apply ice to your injection site to reduce pain and swelling the day of your procedure. Ice may be applied 3-4 times:  Put ice in a plastic bag.  Place a towel between your skin and the bag.  Leave the ice on for no longer than 15-20 minutes each  time. SEEK IMMEDIATE MEDICAL CARE IF:  Pain and swelling get worse rather than better or extend beyond the injection site.  Numbness does not go away.  Blood or fluid continues to leak from the injection site.  You have chest pain.  You have swelling of your face or tongue.  You have trouble breathing or you become dizzy.  You develop a fever, chills, or severe tenderness at the injection site that last longer than 1 day. MAKE SURE YOU:  Understand these instructions.  Watch your condition.  Get help right away if you are not doing well or if you get worse. Document Released: 02/15/2011 Document Revised: 08/27/2011 Document Reviewed: 02/15/2011  [facility] Patient Information 2014 [place].

## 2015-08-17 NOTE — Progress Notes (Signed)
Patient ID: Cindy Schultz, female   DOB: [birth year], 82 y.o.   MRN: [id number]  ESTABLISHED PATIENT NEW PROBLEM   Chief Complaint  Patient presents with  . Knee Pain    Right knee pain     Cindy Schultz is a 82 y.o. female.   HPI Lateral and medial knee pain. Patient reports pain along lateral joint line and pain along the medial joint line associated with stiffness which is described as a dull stabbing aching pain radiates between 5-10 out of 10 unrelieved by capsaicin and Aspercreme she says nothing makes it better or standing for long periods of time makes it worse  Pertinent review of systems she does report stiffness joints back pain joint pain muscle weakness on constipation frequent urination and poor bladder control seasonal allergies hearing loss sinus situs vision problems cough Review of Systems Constitutional: no fever Neuro: normal sensation   Past Medical History  Diagnosis Date  . Macular degeneration   . Glaucoma   . Precancerous changes of the cervix     lesions  . Rectocele   . Diverticulitis   . Lumbar spine pain   . Pain in joints   . Hearing loss   . HTN (hypertension)     cholesterol  . Lymphedema   . DVT (deep venous thrombosis) 07/14/10    LEA doppler   . SOB (shortness of breath) 04/04/2010    2D echo EF=>55%  . Carotid artery occlusion   . Cancer March 2016    Skin Cancer :  Right Cheek     SCC  . Cancer 2013    Skin Cancer  :  Anterior Neck   SCC    Past Surgical History  Procedure Laterality Date  . Salk    . Tracheotomy    . Fallen uterus    . Ovarian cyst surgery    . Bladder tac    . Hemmorrhoidectomy    . Met osteostomy    . Left total knee replacement  05/02/04    [name]  . Morton's nueroma    . Elevated metatarsal arch    . Rupture rt groin    . Appendix    . Rectocele repair    . Corneal erosion      cataracts   . Colonoscopy  09/20/2011    Procedure: COLONOSCOPY;  Surgeon: Beryle Beams, MD;  Location: [facility]  ENDOSCOPY;  Service: Endoscopy;  Laterality: N/A;  . Abdominal hysterectomy    . Skin cancer excision Right March 2016  2013    Cheek -  [facility]  and  Anterior Neck    Social History Social History  Substance Use Topics  . Smoking status: Never Smoker   . Smokeless tobacco: Never Used  . Alcohol Use: No    Allergies  Allergen Reactions  . Promethazine Hcl Nausea And Vomiting and Other (See Comments)    REACTION: deathly sick  . Codeine Nausea And Vomiting  . Tramadol Hcl Nausea And Vomiting and Other (See Comments)    Motion Sickness  . Hydrocodone Other (See Comments)    Unknown Reaction     Current Outpatient Prescriptions  Medication Sig Dispense Refill  . acetaminophen (TYLENOL) 500 MG tablet Take 500 mg by mouth every 6 (six) hours as needed (pain).     . Calcium 600-200 MG-UNIT per tablet Take 1 tablet by mouth daily.      . cephALEXin (KEFLEX) 500 MG capsule Take 500 mg by mouth 4 (four) times  daily.    . dorzolamide-timolol (COSOPT) 22.3-6.8 MG/ML ophthalmic solution Place 1 drop into both eyes 2 (two) times daily.      . ferrous sulfate 325 (65 FE) MG tablet Take 325 mg by mouth daily with breakfast.    . fluticasone (FLONASE) 50 MCG/ACT nasal spray Place 2 sprays into the nose daily.      Marland Kitchen gabapentin (NEURONTIN) 100 MG capsule Take 1 capsule (100 mg total) by mouth at bedtime. 30 capsule 5  . glucosamine-chondroitin 500-400 MG tablet Take 1 tablet by mouth daily.     Marland Kitchen latanoprost (XALATAN) 0.005 % ophthalmic solution Place 1 drop into the right eye at bedtime.     Marland Kitchen losartan (COZAAR) 50 MG tablet     . Multiple Vitamins-Minerals (OCUVITE PO) Take 1 tablet by mouth daily.    Marland Kitchen NIFEdipine (PROCARDIA-XL/ADALAT CC) 30 MG 24 hr tablet Take 30 mg by mouth daily.     Marland Kitchen oxybutynin (DITROPAN-XL) 5 MG 24 hr tablet Take 5 mg by mouth daily.    Vladimir Faster Glycol-Propyl Glycol (SYSTANE ULTRA OP) Apply 1 drop to eye 3 (three) times daily.      No current facility-administered  medications for this visit.       Physical Exam  Physical Exam  BP 134/69 mmHg  Pulse 95  Ht '5\' 3"'  (1.6 m)  Wt 131 lb 12.8 oz (59.784 kg)  BMI 23.35 kg/m2 The patient is well developed well nourished and well groomed.  Orientation to person place and time is normal  Mood is pleasant. Ambulatory status  no cane and present no significant limp Skin remains intact without laceration ulceration or erythema Gross motor exam is intact without atrophy. Muscle tone normal grade 5 motor strength Neurovascular exam remains intact Inspection ITB tenderness at the Gerdy's tubercle and medial joint line tenderness no effusion Joint range of motion full range of motion Joint stability no instability  The opposite extremity has normal alignment and no atrophy tremor or joint subluxation   Data Reviewed  I have independently reviewed the radiographs There are # 3 images and  Show osteoarthritis primarily medial compartment   Assessment   IT band tendinitis Primary osteoarthritis medial compartment right knee Plan   Inject joint for medial compartment gonarthrosis  Ice and Aspercreme for iliotibial band tendinitis follow-up if not better.   Procedure note right knee injection verbal consent was obtained to inject right knee joint  Timeout was completed to confirm the site of injection  The medications used were 40 mg of Depo-Medrol and 1% lidocaine 3 cc  Anesthesia was provided by ethyl chloride and the skin was prepped with alcohol.  After cleaning the skin with alcohol a 20-gauge needle was used to inject the right knee joint. There were no complications. A sterile bandage was applied.

## 2015-08-22 DIAGNOSIS — R499 Unspecified voice and resonance disorder: Secondary | ICD-10-CM | POA: Diagnosis not present

## 2015-08-22 DIAGNOSIS — M159 Polyosteoarthritis, unspecified: Secondary | ICD-10-CM | POA: Diagnosis not present

## 2015-08-22 DIAGNOSIS — R413 Other amnesia: Secondary | ICD-10-CM | POA: Diagnosis not present

## 2015-08-22 DIAGNOSIS — K922 Gastrointestinal hemorrhage, unspecified: Secondary | ICD-10-CM | POA: Diagnosis not present

## 2015-08-22 DIAGNOSIS — I10 Essential (primary) hypertension: Secondary | ICD-10-CM | POA: Diagnosis not present

## 2015-08-24 DIAGNOSIS — L570 Actinic keratosis: Secondary | ICD-10-CM | POA: Diagnosis not present

## 2015-08-24 DIAGNOSIS — N183 Chronic kidney disease, stage 3 (moderate): Secondary | ICD-10-CM | POA: Diagnosis not present

## 2015-08-24 DIAGNOSIS — D509 Iron deficiency anemia, unspecified: Secondary | ICD-10-CM | POA: Diagnosis not present

## 2015-08-24 DIAGNOSIS — D225 Melanocytic nevi of trunk: Secondary | ICD-10-CM | POA: Diagnosis not present

## 2015-08-24 DIAGNOSIS — I10 Essential (primary) hypertension: Secondary | ICD-10-CM | POA: Diagnosis not present

## 2015-08-24 DIAGNOSIS — Z85828 Personal history of other malignant neoplasm of skin: Secondary | ICD-10-CM | POA: Diagnosis not present

## 2015-08-24 DIAGNOSIS — Z79899 Other long term (current) drug therapy: Secondary | ICD-10-CM | POA: Diagnosis not present

## 2015-08-24 DIAGNOSIS — R809 Proteinuria, unspecified: Secondary | ICD-10-CM | POA: Diagnosis not present

## 2015-08-24 DIAGNOSIS — Z08 Encounter for follow-up examination after completed treatment for malignant neoplasm: Secondary | ICD-10-CM | POA: Diagnosis not present

## 2015-08-24 DIAGNOSIS — X32XXXD Exposure to sunlight, subsequent encounter: Secondary | ICD-10-CM | POA: Diagnosis not present

## 2015-08-24 DIAGNOSIS — E559 Vitamin D deficiency, unspecified: Secondary | ICD-10-CM | POA: Diagnosis not present

## 2015-08-26 DIAGNOSIS — Z1231 Encounter for screening mammogram for malignant neoplasm of breast: Secondary | ICD-10-CM | POA: Diagnosis not present

## 2015-08-30 DIAGNOSIS — M542 Cervicalgia: Secondary | ICD-10-CM | POA: Diagnosis not present

## 2015-08-30 DIAGNOSIS — M9901 Segmental and somatic dysfunction of cervical region: Secondary | ICD-10-CM | POA: Diagnosis not present

## 2015-08-30 DIAGNOSIS — M9903 Segmental and somatic dysfunction of lumbar region: Secondary | ICD-10-CM | POA: Diagnosis not present

## 2015-08-30 DIAGNOSIS — M545 Low back pain: Secondary | ICD-10-CM | POA: Diagnosis not present

## 2015-08-30 DIAGNOSIS — N183 Chronic kidney disease, stage 3 (moderate): Secondary | ICD-10-CM | POA: Diagnosis not present

## 2015-08-30 DIAGNOSIS — I10 Essential (primary) hypertension: Secondary | ICD-10-CM | POA: Diagnosis not present

## 2015-08-30 DIAGNOSIS — R809 Proteinuria, unspecified: Secondary | ICD-10-CM | POA: Diagnosis not present

## 2015-08-30 DIAGNOSIS — D649 Anemia, unspecified: Secondary | ICD-10-CM | POA: Diagnosis not present

## 2015-08-31 ENCOUNTER — Other Ambulatory Visit (HOSPITAL_COMMUNITY): Payer: Self-pay | Admitting: Pulmonary Disease

## 2015-08-31 ENCOUNTER — Ambulatory Visit (HOSPITAL_COMMUNITY)
Admission: RE | Admit: 2015-08-31 | Discharge: 2015-08-31 | Disposition: A | Discharge status: Home / Self Care | Payer: Medicare Other | Source: Ambulatory Visit | Attending: Pulmonary Disease | Admitting: Pulmonary Disease

## 2015-08-31 DIAGNOSIS — R059 Cough, unspecified: Secondary | ICD-10-CM

## 2015-08-31 DIAGNOSIS — R05 Cough: Secondary | ICD-10-CM | POA: Diagnosis not present

## 2015-08-31 DIAGNOSIS — I10 Essential (primary) hypertension: Secondary | ICD-10-CM | POA: Diagnosis not present

## 2015-08-31 DIAGNOSIS — J209 Acute bronchitis, unspecified: Secondary | ICD-10-CM | POA: Diagnosis not present

## 2015-09-13 ENCOUNTER — Encounter (HOSPITAL_COMMUNITY): Payer: Self-pay

## 2015-10-06 ENCOUNTER — Ambulatory Visit (INDEPENDENT_AMBULATORY_CARE_PROVIDER_SITE_OTHER): Payer: Medicare Other | Admitting: Otolaryngology

## 2015-10-06 DIAGNOSIS — H903 Sensorineural hearing loss, bilateral: Secondary | ICD-10-CM

## 2015-10-13 DIAGNOSIS — H401134 Primary open-angle glaucoma, bilateral, indeterminate stage: Secondary | ICD-10-CM | POA: Diagnosis not present

## 2015-10-13 DIAGNOSIS — H3530 Unspecified macular degeneration: Secondary | ICD-10-CM | POA: Diagnosis not present

## 2015-10-13 DIAGNOSIS — H16223 Keratoconjunctivitis sicca, not specified as Sjogren's, bilateral: Secondary | ICD-10-CM | POA: Diagnosis not present

## 2015-10-18 DIAGNOSIS — M9901 Segmental and somatic dysfunction of cervical region: Secondary | ICD-10-CM | POA: Diagnosis not present

## 2015-10-18 DIAGNOSIS — M9903 Segmental and somatic dysfunction of lumbar region: Secondary | ICD-10-CM | POA: Diagnosis not present

## 2015-10-18 DIAGNOSIS — M545 Low back pain: Secondary | ICD-10-CM | POA: Diagnosis not present

## 2015-10-18 DIAGNOSIS — M542 Cervicalgia: Secondary | ICD-10-CM | POA: Diagnosis not present

## 2015-10-24 ENCOUNTER — Ambulatory Visit (INDEPENDENT_AMBULATORY_CARE_PROVIDER_SITE_OTHER): Payer: Medicare Other | Admitting: Orthopedic Surgery

## 2015-10-24 VITALS — BP 151/75 | Ht 63.0 in | Wt 131.0 lb

## 2015-10-24 DIAGNOSIS — M25561 Pain in right knee: Secondary | ICD-10-CM | POA: Diagnosis not present

## 2015-10-24 DIAGNOSIS — M7051 Other bursitis of knee, right knee: Secondary | ICD-10-CM

## 2015-10-24 MED ORDER — TRAMADOL HCL 50 MG PO TABS: 50.0000 mg | ORAL_TABLET | Freq: Four times a day (QID) | ORAL | Status: DC | PRN

## 2015-10-24 NOTE — Progress Notes (Signed)
Patient ID: Cindy Schultz Schultz, female   DOB: 08-18-1923, 82 y.o.   MRN: [id number]   Chief Complaint  Patient presents with  . Shoulder Pain    Right shoulder pain, no injury.    Cindy Schultz Schultz is a 82 y.o. female.   Cindy Schultz Schultz presented initially for shoulder pain but chief complaint is now right knee pain acute for 3 days. Severe pain and swelling medial aspect of right knee. Trouble weightbearing. No catching locking giving way. No instability. Review of Systems See Cindy Schultz Denies fever chills or weight loss  Denies any burning pain numbness or tingling in the leg   Past Medical History  Diagnosis Date  . Macular degeneration   . Glaucoma   . Precancerous changes of the cervix     lesions  . Rectocele   . Diverticulitis   . Lumbar spine pain   . Pain in joints   . Hearing loss   . HTN (hypertension)     cholesterol  . Lymphedema   . DVT (deep venous thrombosis) 07/14/10    LEA doppler   . SOB (shortness of breath) 04/04/2010    2D echo EF=>55%  . Carotid artery occlusion   . Cancer March 2016    Skin Cancer :  Right Cheek     SCC  . Cancer 2013    Skin Cancer  :  Anterior Neck   SCC    Past Surgical History  Procedure Laterality Date  . Salk    . Tracheotomy    . Fallen uterus    . Ovarian cyst surgery    . Bladder tac    . Hemmorrhoidectomy    . Met osteostomy    . Left total knee replacement  05/02/04    [name]  . Morton's nueroma    . Elevated metatarsal arch    . Rupture rt groin    . Appendix    . Rectocele repair    . Corneal erosion      cataracts   . Colonoscopy  09/20/2011    Procedure: COLONOSCOPY;  Surgeon: Beryle Beams, MD;  Location: [facility] ENDOSCOPY;  Service: Endoscopy;  Laterality: N/A;  . Abdominal hysterectomy    . Skin cancer excision Right March 2016  2013    Cheek -  [facility]  and  Anterior Neck    Allergies  Allergen Reactions  . Promethazine Hcl Nausea And Vomiting and Other (See Comments)    REACTION: deathly sick  . Codeine  Nausea And Vomiting  . Tramadol Hcl Nausea And Vomiting and Other (See Comments)    Motion Sickness  . Hydrocodone Other (See Comments)    Unknown Reaction     Current Outpatient Prescriptions  Medication Sig Dispense Refill  . acetaminophen (TYLENOL) 500 MG tablet Take 500 mg by mouth every 6 (six) hours as needed (pain).     . Calcium 600-200 MG-UNIT per tablet Take 1 tablet by mouth daily.      . cephALEXin (KEFLEX) 500 MG capsule Take 500 mg by mouth 4 (four) times daily.    . dorzolamide-timolol (COSOPT) 22.3-6.8 MG/ML ophthalmic solution Place 1 drop into both eyes 2 (two) times daily.      . ferrous sulfate 325 (65 FE) MG tablet Take 325 mg by mouth daily with breakfast.    . fluticasone (FLONASE) 50 MCG/ACT nasal spray Place 2 sprays into the nose daily.      Marland Kitchen gabapentin (NEURONTIN) 100 MG capsule Take 1 capsule (100 mg  total) by mouth at bedtime. 30 capsule 5  . glucosamine-chondroitin 500-400 MG tablet Take 1 tablet by mouth daily.     Marland Kitchen latanoprost (XALATAN) 0.005 % ophthalmic solution Place 1 drop into the right eye at bedtime.     Marland Kitchen losartan (COZAAR) 50 MG tablet     . Multiple Vitamins-Minerals (OCUVITE PO) Take 1 tablet by mouth daily.    Marland Kitchen NIFEdipine (PROCARDIA-XL/ADALAT CC) 30 MG 24 hr tablet Take 30 mg by mouth daily.     Marland Kitchen oxybutynin (DITROPAN-XL) 5 MG 24 hr tablet Take 5 mg by mouth daily.    Vladimir Faster Glycol-Propyl Glycol (SYSTANE ULTRA OP) Apply 1 drop to eye 3 (three) times daily.     . traMADol (ULTRAM) 50 MG tablet Take 1 tablet (50 mg total) by mouth every 6 (six) hours as needed. 60 tablet 5   No current facility-administered medications for this visit.     Physical Exam Blood pressure 151/75, height '5\' 3"'  (1.6 m), weight 131 lb (59.421 kg). Physical Exam   The patient is well developed well nourished and well groomed.   Orientation to person place and time is normal   Mood is pleasant. Ambulatory status is remarkable for a Walker which is new  since this started        Right Knee examination:  Inspection: Tenderness is noted over the medial joint line   ROM: Is limited by pain with a maximum flexion arc of passive motion of 115  Stability: Collateral ligaments are stable, the Lachman test and anterior and posterior drawer tests are normal    We do palpate medial bursal tissue  Motor exam: Grade 5 motor strength in the quadriceps musculature   Skin: Warm dry and intact over the right leg                       Neuro: normal sensation   Vascular: 2+ DP pulse with normal color and no edema.   Currently the left lower extremity and knee examination revealed no tenderness or swelling, full range of motion without contracture subluxation atrophy or tremor. Normal muscle tone no instability and the neurovascular status of the limb is normal.    Assessment and Plan:  IMAGING: I have read and interpret the xrays as follows:   No trauma so no new imaging was taken we took an x-ray March is arthritis in the knee  Diagnosis and treatment:   Right knee bursitis  Inject right knee  Inject right knee pain is bursitis  Verbal consent was obtained timeout was completed  Alcohol was used to prep the skin the medial pes bursa was injected with 40 mg of Depo-Medrol and 3 mL 1% lidocaine  Tolerated well no complication

## 2015-10-25 ENCOUNTER — Telehealth: Payer: Self-pay | Admitting: Orthopedic Surgery

## 2015-10-25 NOTE — Telephone Encounter (Signed)
Routing to Dr. Harrison to advise 

## 2015-10-25 NOTE — Telephone Encounter (Signed)
Patient called to relay that the medication: traMADol (ULTRAM) 50 MG tablet [id number] prescribed yesterday, 10/24/15, is causing nausea.  Her pharmacy is [facility] in [place].

## 2015-10-26 ENCOUNTER — Other Ambulatory Visit: Payer: Self-pay | Admitting: Orthopedic Surgery

## 2015-10-26 MED ORDER — PROMETHAZINE HCL 12.5 MG PO TABS: 12.5000 mg | ORAL_TABLET | Freq: Four times a day (QID) | ORAL | Status: DC | PRN

## 2015-10-26 NOTE — Telephone Encounter (Signed)
[facility] SENT IN

## 2015-10-27 NOTE — Telephone Encounter (Signed)
Patient aware.

## 2015-10-31 ENCOUNTER — Ambulatory Visit (INDEPENDENT_AMBULATORY_CARE_PROVIDER_SITE_OTHER): Payer: Medicare Other | Admitting: Orthopedic Surgery

## 2015-10-31 ENCOUNTER — Encounter: Payer: Self-pay | Admitting: Orthopedic Surgery

## 2015-10-31 VITALS — BP 120/58 | HR 74 | Ht 63.0 in | Wt 131.0 lb

## 2015-10-31 DIAGNOSIS — M7051 Other bursitis of knee, right knee: Secondary | ICD-10-CM

## 2015-10-31 DIAGNOSIS — M7551 Bursitis of right shoulder: Secondary | ICD-10-CM

## 2015-10-31 NOTE — Progress Notes (Signed)
Patient ID: Cindy Schultz, female   DOB: 1924/01/16, 82 y.o.   MRN: [id number]  Chief Complaint  Patient presents with  . Follow-up    Right knee, 1 week s/p injection     HPI status post injection right knee for severe bursitis. She says she's not better but she really is she can now weight-bear she can bend her knee she can stand on the knee She has a new complaint of right shoulder pain status post dislocation originally treated with therapy did well now complains of right shoulder pain dull ache moderate in severity several weeks worse with crossing her arm across her chest or lifting her arm above her head  Review of Systems  Constitutional: Negative for fever and chills.  Musculoskeletal: Negative for neck pain.  Neurological: Negative for tingling.   Past Medical History  Diagnosis Date  . Macular degeneration   . Glaucoma   . Precancerous changes of the cervix     lesions  . Rectocele   . Diverticulitis   . Lumbar spine pain   . Pain in joints   . Hearing loss   . HTN (hypertension)     cholesterol  . Lymphedema   . DVT (deep venous thrombosis) ([place]) 07/14/10    LEA doppler   . SOB (shortness of breath) 04/04/2010    2D echo EF=>55%  . Carotid artery occlusion   . Cancer [facility]) March 2016    Skin Cancer :  Right Cheek     SCC  . Cancer ([place]) 2013    Skin Cancer  :  Anterior Neck   SCC    BP 120/58 mmHg  Pulse 74  Ht 5\' 3"  (1.6 m)  Wt 131 lb (59.421 kg)  BMI 23.21 kg/m2  Physical Exam Physical Exam  Constitutional: The patient is oriented to person, place, and time. The patient appears well-developed and well-nourished. No distress.  Cardiovascular: Intact distal pulses.   Neurological: The patient is alert and oriented to person, place, and time. The patient exhibits normal muscle tone. Coordination normal.  Skin: Skin is warm and dry. No rash noted. The patient is not diaphoretic. No erythema. No pallor.  Psychiatric: The patient has a normal mood and  affect. Her behavior is normal. Judgment and thought content normal.   ABNORMAL GAIT;   Ortho Exam  Right shoulder painful range of motion limited to 120 painful across the chest range of motion shoulder stable strength slight weakness skin normal no axillary lymph nodes pulses normal in the right wrist sensation normal in the right hand  Right knee. Decreased swelling decreased tenderness improved range of motion knee remains stable skin ecchymotic no erythema pulses are good distally sensation is normal  ASSESSMENT AND PLAN   Resolving bursitis still symptomatic  Right shoulder bursitis checked next visit  Inject right knee for bursitis  Procedure note right knee injection for bursitis  verbal consent was obtained to inject right knee PES BURSA  Timeout was completed to confirm the site of injection  The medications used were 40 mg of Depo-Medrol and 1% lidocaine 3 cc  Anesthesia was provided by ethyl chloride and the skin was prepped with alcohol.  After cleaning the skin with alcohol a 25-gauge needle was used to inject the right knee bursa.  There were no complications and a sterile bandage was applied

## 2015-10-31 NOTE — Patient Instructions (Signed)
Procedure note right knee injection verbal consent was obtained to inject right knee joint  Timeout was completed to confirm the site of injection  The medications used were 40 mg of Depo-Medrol and 1% lidocaine 3 cc  Anesthesia was provided by ethyl chloride and the skin was prepped with alcohol.  After cleaning the skin with alcohol a 20-gauge needle was used to inject the right knee joint. There were no complications. A sterile bandage was applied.

## 2015-11-08 DIAGNOSIS — H3530 Unspecified macular degeneration: Secondary | ICD-10-CM | POA: Diagnosis not present

## 2015-11-08 DIAGNOSIS — Z9849 Cataract extraction status, unspecified eye: Secondary | ICD-10-CM | POA: Diagnosis not present

## 2015-11-08 DIAGNOSIS — Z961 Presence of intraocular lens: Secondary | ICD-10-CM | POA: Diagnosis not present

## 2015-11-17 DIAGNOSIS — M2042 Other hammer toe(s) (acquired), left foot: Secondary | ICD-10-CM | POA: Diagnosis not present

## 2015-11-17 DIAGNOSIS — M79675 Pain in left toe(s): Secondary | ICD-10-CM | POA: Diagnosis not present

## 2015-11-17 DIAGNOSIS — M79672 Pain in left foot: Secondary | ICD-10-CM | POA: Diagnosis not present

## 2015-11-21 ENCOUNTER — Other Ambulatory Visit: Payer: Self-pay | Admitting: *Deleted

## 2015-11-21 DIAGNOSIS — M9901 Segmental and somatic dysfunction of cervical region: Secondary | ICD-10-CM | POA: Diagnosis not present

## 2015-11-21 DIAGNOSIS — M545 Low back pain: Secondary | ICD-10-CM | POA: Diagnosis not present

## 2015-11-21 DIAGNOSIS — M542 Cervicalgia: Secondary | ICD-10-CM | POA: Diagnosis not present

## 2015-11-21 DIAGNOSIS — M9903 Segmental and somatic dysfunction of lumbar region: Secondary | ICD-10-CM | POA: Diagnosis not present

## 2015-11-21 MED ORDER — GABAPENTIN 100 MG PO CAPS
100.0000 mg | ORAL_CAPSULE | Freq: Every day | ORAL | Status: DC
Start: 2015-11-21 — End: 2017-03-08

## 2015-11-22 DIAGNOSIS — I10 Essential (primary) hypertension: Secondary | ICD-10-CM | POA: Diagnosis not present

## 2015-11-22 DIAGNOSIS — G3184 Mild cognitive impairment, so stated: Secondary | ICD-10-CM | POA: Diagnosis not present

## 2015-11-22 DIAGNOSIS — M1990 Unspecified osteoarthritis, unspecified site: Secondary | ICD-10-CM | POA: Diagnosis not present

## 2015-11-22 DIAGNOSIS — I120 Hypertensive chronic kidney disease with stage 5 chronic kidney disease or end stage renal disease: Secondary | ICD-10-CM | POA: Diagnosis not present

## 2015-11-28 ENCOUNTER — Ambulatory Visit (INDEPENDENT_AMBULATORY_CARE_PROVIDER_SITE_OTHER): Payer: Medicare Other | Admitting: Orthopedic Surgery

## 2015-11-28 DIAGNOSIS — M542 Cervicalgia: Secondary | ICD-10-CM | POA: Diagnosis not present

## 2015-11-28 DIAGNOSIS — M9901 Segmental and somatic dysfunction of cervical region: Secondary | ICD-10-CM | POA: Diagnosis not present

## 2015-11-28 DIAGNOSIS — M25561 Pain in right knee: Secondary | ICD-10-CM | POA: Diagnosis not present

## 2015-11-28 DIAGNOSIS — M9903 Segmental and somatic dysfunction of lumbar region: Secondary | ICD-10-CM | POA: Diagnosis not present

## 2015-11-28 DIAGNOSIS — M545 Low back pain: Secondary | ICD-10-CM | POA: Diagnosis not present

## 2015-11-28 NOTE — Progress Notes (Signed)
Chief Complaint  Patient presents with  . Follow-up    4 week recheck on right knee.   BP 107/52 mmHg  Pulse 91  Ht 5\' 3"  (1.6 m)  Wt 131 lb (59.421 kg)  BMI 23.21 kg/m2  Right knee bursitis improved no pain  Patient has gained normal range of motion  Patient like her shoulder checked out we'll check it out with a new visit for new problem

## 2015-12-01 DIAGNOSIS — I10 Essential (primary) hypertension: Secondary | ICD-10-CM | POA: Diagnosis not present

## 2015-12-01 DIAGNOSIS — G3184 Mild cognitive impairment, so stated: Secondary | ICD-10-CM | POA: Diagnosis not present

## 2015-12-01 DIAGNOSIS — M1990 Unspecified osteoarthritis, unspecified site: Secondary | ICD-10-CM | POA: Diagnosis not present

## 2015-12-01 DIAGNOSIS — I120 Hypertensive chronic kidney disease with stage 5 chronic kidney disease or end stage renal disease: Secondary | ICD-10-CM | POA: Diagnosis not present

## 2015-12-07 ENCOUNTER — Ambulatory Visit (INDEPENDENT_AMBULATORY_CARE_PROVIDER_SITE_OTHER): Payer: Medicare Other

## 2015-12-07 ENCOUNTER — Ambulatory Visit (INDEPENDENT_AMBULATORY_CARE_PROVIDER_SITE_OTHER): Payer: Medicare Other | Admitting: Orthopedic Surgery

## 2015-12-07 VITALS — BP 110/64 | HR 66 | Ht 63.0 in | Wt 130.4 lb

## 2015-12-07 DIAGNOSIS — M75101 Unspecified rotator cuff tear or rupture of right shoulder, not specified as traumatic: Secondary | ICD-10-CM

## 2015-12-07 DIAGNOSIS — M542 Cervicalgia: Secondary | ICD-10-CM | POA: Diagnosis not present

## 2015-12-07 DIAGNOSIS — M25511 Pain in right shoulder: Secondary | ICD-10-CM | POA: Diagnosis not present

## 2015-12-07 NOTE — Addendum Note (Signed)
Addended by: Moreen Fowler R on: 12/07/2015 03:55 PM   Modules accepted: Orders

## 2015-12-07 NOTE — Patient Instructions (Signed)
Call hospital to arrange PT

## 2015-12-07 NOTE — Progress Notes (Signed)
Patient ID: Cindy Schultz, female   DOB: 17-Mar-1924, 82 y.o.   MRN: [id number]  Chief Complaint  Patient presents with  . new problem    right shoulder, had shoulder dislocation in 2011    HPI Cindy Schultz is a 82 y.o. female.   HPI H/O DISLCATION RIGHT SHOULDER; C/O PAIN STIFFNESS WEAKNESS AND DULL ACHING PAIN   Review of Systems Review of Systems Normal neuro  Denies fever NECK STIFFNESS   Past Medical History  Diagnosis Date  . Macular degeneration   . Glaucoma   . Precancerous changes of the cervix     lesions  . Rectocele   . Diverticulitis   . Lumbar spine pain   . Pain in joints   . Hearing loss   . HTN (hypertension)     cholesterol  . Lymphedema   . DVT (deep venous thrombosis) ([place]) 07/14/10    LEA doppler   . SOB (shortness of breath) 04/04/2010    2D echo EF=>55%  . Carotid artery occlusion   . Cancer [facility]) March 2016    Skin Cancer :  Right Cheek     SCC  . Cancer [facility]) 2013    Skin Cancer  :  Anterior Neck   SCC    Past Surgical History  Procedure Laterality Date  . Salk    . Tracheotomy    . Fallen uterus    . Ovarian cyst surgery    . Bladder tac    . Hemmorrhoidectomy    . Met osteostomy    . Left total knee replacement  05/02/04    [name]  . Morton's nueroma    . Elevated metatarsal arch    . Rupture rt groin    . Appendix    . Rectocele repair    . Corneal erosion      cataracts   . Colonoscopy  09/20/2011    Procedure: COLONOSCOPY;  Surgeon: Beryle Beams, MD;  Location: [facility] ENDOSCOPY;  Service: Endoscopy;  Laterality: N/A;  . Abdominal hysterectomy    . Skin cancer excision Right March 2016  2013    Cheek -  [facility]  and  Anterior Neck      Family History  Problem Relation Age of Onset  . Stroke Mother   . Diabetes Mother   . Heart attack Father   . COPD Paternal Grandfather   . Dementia Sister    The patient was quizzed about their family history and reported no history of bleeding problems or anesthesia problems in  their family  Social History Social History  Substance Use Topics  . Smoking status: Never Smoker   . Smokeless tobacco: Never Used  . Alcohol Use: No    Allergies  Allergen Reactions  . Promethazine Hcl Nausea And Vomiting and Other (See Comments)    REACTION: deathly sick  . Codeine Nausea And Vomiting  . Tramadol Hcl Nausea And Vomiting and Other (See Comments)    Motion Sickness  . Hydrocodone Other (See Comments)    Unknown Reaction     Current Outpatient Prescriptions  Medication Sig Dispense Refill  . acetaminophen (TYLENOL) 500 MG tablet Take 500 mg by mouth every 6 (six) hours as needed (pain).     . Calcium 600-200 MG-UNIT per tablet Take 1 tablet by mouth daily.      . cephALEXin (KEFLEX) 500 MG capsule Take 500 mg by mouth 4 (four) times daily.    . dorzolamide-timolol (COSOPT) 22.3-6.8 MG/ML ophthalmic solution  Place 1 drop into both eyes 2 (two) times daily.      . ferrous sulfate 325 (65 FE) MG tablet Take 325 mg by mouth daily with breakfast.    . fluticasone (FLONASE) 50 MCG/ACT nasal spray Place 2 sprays into the nose daily.      Marland Kitchen gabapentin (NEURONTIN) 100 MG capsule Take 1 capsule (100 mg total) by mouth at bedtime. 30 capsule 5  . glucosamine-chondroitin 500-400 MG tablet Take 1 tablet by mouth daily.     Marland Kitchen latanoprost (XALATAN) 0.005 % ophthalmic solution Place 1 drop into the right eye at bedtime.     Marland Kitchen losartan (COZAAR) 50 MG tablet     . Multiple Vitamins-Minerals (OCUVITE PO) Take 1 tablet by mouth daily.    Marland Kitchen NIFEdipine (PROCARDIA-XL/ADALAT CC) 30 MG 24 hr tablet Take 30 mg by mouth daily.     Marland Kitchen oxybutynin (DITROPAN-XL) 5 MG 24 hr tablet Take 5 mg by mouth daily.    Vladimir Faster Glycol-Propyl Glycol (SYSTANE ULTRA OP) Apply 1 drop to eye 3 (three) times daily.     . promethazine (PHENERGAN) 12.5 MG tablet Take 1 tablet (12.5 mg total) by mouth every 6 (six) hours as needed for nausea or vomiting. 60 tablet 2  . traMADol (ULTRAM) 50 MG tablet Take 1  tablet (50 mg total) by mouth every 6 (six) hours as needed. 60 tablet 5   No current facility-administered medications for this visit.       Physical Exam Blood pressure 110/64, pulse 66, height '5\' 3"'  (1.6 m), weight 130 lb 6.4 oz (59.149 kg). Physical Exam The patient is well developed well nourished and well groomed.  Orientation to person place and time is normal  Mood is pleasant.  Ambulatory status POOR SLOW GAIT SHUFFLES  Cervical spine exam is as follows NOT TENDER BUT LOSS OF MOTION GLOBALLY   Ortho Exam RIGHT  shoulder  Examination: Inspection reveals tenderness around the peri-acromial region. The patient has decreased range of motion and grade 4/ 5 motor function of the rotator cuff. Stability in abduction external rotation is normal.   Neurovascular examination is intact and the lymph nodes in the axilla and supraclavicular regions are normal   The opposite shoulder LEFT exhibits normal range of motion stability and strength neurovascular exam is intact, lymph nodes are negative and there is no swelling or tenderness   Data Reviewed IMAGING: AP/LAT RT Shoulder: I have read and interpret the x-ray as follows:  PROXIMAL MIGRATION RIGHT HUMERUS  PROBABLE CHRONIC ROT CUFF TEAR    Assessment    RT SHOULDER  ROTATOR CUFF SYNDROME ROTATOR CUFF TEAR  SHOULDER PAIN     Plan INJECTION  F/U 3 MONTHS   I  Procedure note the subacromial injection shoulder RIGHT  Verbal consent was obtained to inject the  RIGHT   Shoulder  Timeout was completed to confirm the injection site is a subacromial space of the  RIGHT  shoulder   Medication used Depo-Medrol 40 mg and lidocaine 1% 3 cc  Anesthesia was provided by ethyl chloride  The injection was performed in the RIGHT  posterior subacromial space. After pinning the skin with alcohol and anesthetized the skin with ethyl chloride the subacromial space was injected using a 20-gauge needle. There were no  complications  Sterile dressing was applied.   Arther Abbott, MD 12/07/2015 3:37 PM

## 2015-12-07 NOTE — Addendum Note (Signed)
Addended by: Moreen Fowler R on: 12/07/2015 04:06 PM   Modules accepted: Orders

## 2015-12-08 ENCOUNTER — Ambulatory Visit (HOSPITAL_COMMUNITY): Payer: Medicare Other | Attending: Orthopedic Surgery

## 2015-12-08 DIAGNOSIS — R29898 Other symptoms and signs involving the musculoskeletal system: Secondary | ICD-10-CM | POA: Insufficient documentation

## 2015-12-08 DIAGNOSIS — M542 Cervicalgia: Secondary | ICD-10-CM | POA: Insufficient documentation

## 2015-12-08 NOTE — Therapy (Signed)
[place], [place], [address] Phone: [phone]   Fax:  [phone]  Occupational Therapy Evaluation  Patient Details  Name: Cindy Schultz MRN: [id number] Date of Birth: 1923-11-14 Referring Provider: Arther Abbott, MD  Encounter Date: 12/08/2015      OT End of Session - 12/08/15 1245    Visit Number 1   Number of Visits 13   Date for OT Re-Evaluation 01/19/16  mini reassess: 01/05/16   Authorization Type Medicare    Authorization Time Period before 10th visit   Authorization - Visit Number 1   Authorization - Number of Visits 10   OT Start Time 1120   OT Stop Time 1205   OT Time Calculation (min) 45 min   Activity Tolerance Patient tolerated treatment well   Behavior During Therapy [facility] for tasks assessed/performed      Past Medical History  Diagnosis Date  . Macular degeneration   . Glaucoma   . Precancerous changes of the cervix     lesions  . Rectocele   . Diverticulitis   . Lumbar spine pain   . Pain in joints   . Hearing loss   . HTN (hypertension)     cholesterol  . Lymphedema   . DVT (deep venous thrombosis) (Cindy Schultz) 07/14/10    LEA doppler   . SOB (shortness of breath) 04/04/2010    2D echo EF=>55%  . Carotid artery occlusion   . Cancer [facility]) March 2016    Skin Cancer :  Right Cheek     SCC  . Cancer [facility]) 2013    Skin Cancer  :  Anterior Neck   SCC    Past Surgical History  Procedure Laterality Date  . Salk    . Tracheotomy    . Fallen uterus    . Ovarian cyst surgery    . Bladder tac    . Hemmorrhoidectomy    . Met osteostomy    . Left total knee replacement  05/02/04    [name]  . Morton's nueroma    . Elevated metatarsal arch    . Rupture rt groin    . Appendix    . Rectocele repair    . Corneal erosion      cataracts   . Colonoscopy  09/20/2011    Procedure: COLONOSCOPY;  Surgeon: Beryle Beams, MD;  Location: [facility] ENDOSCOPY;  Service: Endoscopy;  Laterality:  N/A;  . Abdominal hysterectomy    . Skin cancer excision Right March 2016  2013    Cheek -  [facility]  and  Anterior Neck    There were no vitals filed for this visit.      Subjective Assessment - 12/08/15 1239    Subjective  S: I don't have pain; it feels more like tension and tightness.    Pertinent History Patient is a 82 y/o female S/P neck pain which she reports has been ongoing for 4-5 years primarily related to lifestyle stress. Pt reports no injury to her neck. She states there is no pain unless she tries to use her neck and move it further than it can go. Pt reports that she has a chronic right rotator cuff tear as well. Dr. [name] has referred patient to occupational therapy for evaluation and treatment.    Special Tests FOTO score: 51/100   Patient Stated Goals To be able to turn my head to look both ways.   Currently in Pain? No/denies  [facility] OT Assessment - 12/08/15 1127    Assessment   Diagnosis Neck pain   Referring Provider Arther Abbott, MD   Assessment 03/08/16 - [name]   Precautions   Precautions None   Restrictions   Weight Bearing Restrictions No   Balance Screen   Has the patient fallen in the past 6 months No   Home  Environment   Family/patient expects to be discharged to: Private residence   Prior Function   Level of [place] Retired   ADL   ADL comments Pt reports difficulty when she's stopped at a stop sign or light and she is required to look to the right or left. She is unable to do so.    Mobility   Mobility Status Independent   Written Expression   Dominant Hand Right   Vision - History   Baseline Vision Wears glasses all the time   Cognition   Overall Cognitive Status Within Functional Limits for tasks assessed   Palpation   Palpation comment Max fascial restrictions in bilateral cervical region and bilateral upper trapezius region.   AROM   Overall AROM Comments Assessed seated.   AROM Assessment  Site Cervical   Cervical Flexion 35   Cervical Extension 40   Cervical - Right Side Bend 12   Cervical - Left Side Bend 21   Cervical - Right Rotation 45   Cervical - Left Rotation 45   Strength   Strength Assessment Site --                         OT Education - 12/08/15 1245    Education provided Yes   Education Details Cervical A/ROM and scapular A/ROM exercises   Person(s) Educated Patient   Methods Explanation;Demonstration;Verbal cues;Handout   Comprehension Returned demonstration;Verbalized understanding          OT Short Term Goals - 12/08/15 1250    OT SHORT TERM GOAL #1   Title Patient will be educated and independent with HEP to increase functional use of neck during daily tasks.   Time 3   Period Weeks   Status New   OT SHORT TERM GOAL #2   Title Patient will increase A/ROM of cervical region by 5 degrees as needed in order to look right and left while driving with less difficulty.    Time 3   Period Weeks   Status New   OT SHORT TERM GOAL #3   Title Patient will decrease fascial restrictions in cervical and trapezius region to mod amount to increase functional mobility.    Time 3   Period Weeks   Status New           OT Long Term Goals - 12/08/15 1252    OT LONG TERM GOAL #1   Title Pt will return to highest level of independence while using her neck to look right and left when driving with reports of increased mobility and comfort leve.    Time 6   Period Weeks   Status New   OT LONG TERM GOAL #2   Title Patient will increase cervical A/ROM by 10 degrees to increase ability to look right and left when driving.    Time 6   Period Weeks   Status New   OT LONG TERM GOAL #3   Title Patient will decrease fascial restrictions to min amount in cervical region to increase functional mobility.    Time 6  Period Weeks   Status New               Plan - 12/08/15 1247    Clinical Impression Statement A: Pt is a 82 y/o female  S/P cervical pain causing increased fascial restrictions and decrease ROM and cervical posture resulting in difficulty completing daily tasks such as driving.    Rehab Potential Good   OT Frequency 2x / week   OT Duration 6 weeks   OT Treatment/Interventions Self-care/ADL training;Ultrasound;DME and/or AE instruction;Passive range of motion;Patient/family education;Cryotherapy;Electrical Stimulation;Moist Heat;Therapeutic activities;Therapeutic exercises;Manual Therapy   Plan P: Patient will benefit from skilled OT services to increase functional performance during driving. Treatment Plan: myofascial release, manual stretching, muscle energy technique, P/ROM, A/ROM, scapular strengthening, cervical stretches.   Consulted and Agree with Plan of Care Patient      Patient will benefit from skilled therapeutic intervention in order to improve the following deficits and impairments:  Decreased strength, Decreased range of motion, Increased fascial restricitons  Visit Diagnosis: Cervicalgia - Plan: Ot plan of care cert/re-cert  Other symptoms and signs involving the musculoskeletal system - Plan: Ot plan of care cert/re-cert      G-Codes - 87/68/11 1253    Functional Assessment Tool Used FOTO score: 51/100 (49% impaired)   Functional Limitation Carrying, moving and handling objects   Carrying, Moving and Handling Objects Current Status (X7262) At least 40 percent but less than 60 percent impaired, limited or restricted   Carrying, Moving and Handling Objects Goal Status (M3559) At least 20 percent but less than 40 percent impaired, limited or restricted      Problem List Patient Active Problem List   Diagnosis Date Noted  . Acute blood loss anemia 09/10/2014  . GIB (gastrointestinal bleeding) 09/08/2014  . Occlusion and stenosis of carotid artery without mention of cerebral infarction 12/14/2013  . Osteoarthritis of left knee 04/23/2013  . Thumb pain 10/22/2012  . De Quervain's syndrome  (tenosynovitis) 10/22/2012  . [facility] DJD(carpometacarpal degenerative joint disease), localized primary 10/22/2012  . CTS (carpal tunnel syndrome) 10/22/2012  . Trigger thumb of right hand 10/22/2012  . Pes anserinus bursitis of right knee 06/26/2012  . OA (osteoarthritis) of knee 06/26/2012  . Leg pain, left 06/26/2012  . Radicular pain of left lower extremity 06/26/2012  . Diverticulosis of colon with hemorrhage 09/22/2011  . Syncopal episodes 09/20/2011  . GI bleed 09/19/2011  . HTN (hypertension) 09/19/2011  . Anemia due to blood loss, acute 09/19/2011  . Abnormality of gait 01/03/2011  . Personal history of fall 12/27/2010  . DISLOCATION CLOSED SHOULDER NEC 04/25/2010  . ANSERINE BURSITIS, LEFT 02/13/2010  . KNEE PAIN 12/22/2009  . LEG PAIN, BILATERAL 12/22/2009  . TOTAL KNEE FOLLOW-UP 12/22/2009  . HIP PAIN 02/07/2009  . LOW BACK PAIN 02/07/2009    Ailene Ravel, OTR/L,CBIS  [phone]  12/08/2015, 12:57 PM  [place] [address] [place], [place], [address] Phone: [phone]   Fax:  [phone]  Name: YAZLEEMAR STRASSNER MRN: [id number] Date of Birth: [birth year]

## 2015-12-08 NOTE — Patient Instructions (Signed)
1) Seated Row   Sit up straight with elbows by your sides. Pull back with shoulders/elbows, keeping forearms straight, as if pulling back on the reins of a horse. Squeeze shoulder blades together. Repeat _10__times, ____sets/day    2) Shoulder Elevation    Sit up straight with arms by your sides. Slowly bring your shoulders up towards your ears. Repeat_10__times, ____ sets/day    3) Shoulder Extension    Sit up straight with both arms by your side, draw your arms back behind your waist. Keep your elbows straight. Repeat _10___times, ____sets/day.     AROM: Lateral Neck Flexion   Slowly tilt head toward one shoulder, then the other.  Repeat __10__ times per set. Do __1__ sets per session. Do ____ sessions per day.  http://orth.exer.us/296   Copyright  VHI. All rights reserved.  AROM: Neck Extension   Bend head backward.  Repeat __10__ times per set. Do ____ sets per session. Do ____ sessions per day.  http://orth.exer.us/300   Copyright  VHI. All rights reserved.  AROM: Neck Flexion   Bend head forward.  Repeat __10__ times per set. Do __1__ sets per session. Do ____ sessions per day.  http://orth.exer.us/298   Copyright  VHI. All rights reserved.  AROM: Neck Rotation   Turn head slowly to look over one shoulder, then the other.  Repeat __10__ times per set. Do __1__ sets per session. Do ____ sessions per day.  http://orth.exer.us/294   Copyright  VHI. All rights reserved.

## 2015-12-09 ENCOUNTER — Ambulatory Visit (HOSPITAL_COMMUNITY): Payer: Medicare Other | Admitting: Occupational Therapy

## 2015-12-09 ENCOUNTER — Encounter (HOSPITAL_COMMUNITY): Payer: Self-pay | Admitting: Occupational Therapy

## 2015-12-09 DIAGNOSIS — M542 Cervicalgia: Secondary | ICD-10-CM

## 2015-12-09 DIAGNOSIS — R29898 Other symptoms and signs involving the musculoskeletal system: Secondary | ICD-10-CM

## 2015-12-09 NOTE — Therapy (Signed)
[place], [place], [address] Phone: [phone]   Fax:  [phone]  Occupational Therapy Treatment  Patient Details  Name: Cindy Schultz MRN: [id number] Date of Birth: 12/02/1923 Referring Provider: Arther Abbott, MD  Encounter Date: 12/09/2015      OT End of Session - 12/09/15 1208    Visit Number 2   Number of Visits 13   Date for OT Re-Evaluation 01/19/16  mini reassess: 01/05/16   Authorization Type Medicare    Authorization Time Period before 10th visit   Authorization - Visit Number 2   Authorization - Number of Visits 10   OT Start Time 1116   OT Stop Time 1158   OT Time Calculation (min) 42 min   Activity Tolerance Patient tolerated treatment well   Behavior During Therapy [facility] for tasks assessed/performed      Past Medical History  Diagnosis Date  . Macular degeneration   . Glaucoma   . Precancerous changes of the cervix     lesions  . Rectocele   . Diverticulitis   . Lumbar spine pain   . Pain in joints   . Hearing loss   . HTN (hypertension)     cholesterol  . Lymphedema   . DVT (deep venous thrombosis) ([place]) 07/14/10    LEA doppler   . SOB (shortness of breath) 04/04/2010    2D echo EF=>55%  . Carotid artery occlusion   . Cancer [facility]) March 2016    Skin Cancer :  Right Cheek     SCC  . Cancer [facility]) 2013    Skin Cancer  :  Anterior Neck   SCC    Past Surgical History  Procedure Laterality Date  . Salk    . Tracheotomy    . Fallen uterus    . Ovarian cyst surgery    . Bladder tac    . Hemmorrhoidectomy    . Met osteostomy    . Left total knee replacement  05/02/04    [name]  . Morton's nueroma    . Elevated metatarsal arch    . Rupture rt groin    . Appendix    . Rectocele repair    . Corneal erosion      cataracts   . Colonoscopy  09/20/2011    Procedure: COLONOSCOPY;  Surgeon: Beryle Beams, MD;  Location: [facility] ENDOSCOPY;  Service: Endoscopy;  Laterality:  N/A;  . Abdominal hysterectomy    . Skin cancer excision Right March 2016  2013    Cheek -  [facility]  and  Anterior Neck    There were no vitals filed for this visit.      Subjective Assessment - 12/09/15 1114    Subjective  S: I did those exercises this morning.    Currently in Pain? No/denies            [facility] OT Assessment - 12/09/15 1114    Assessment   Diagnosis Neck pain   Precautions   Precautions None                  OT Treatments/Exercises (OP) - 12/09/15 1119    Exercises   Exercises Neck   Neck Exercises: Seated   Neck Retraction 10 reps   Cervical Rotation Both;10 reps   Lateral Flexion Both;10 reps   Shoulder Shrugs 10 reps   Shoulder Rolls Backwards;Forwards;10 reps   Other Seated Exercise cervical flexion/extension, 10 reps   Other  Seated Exercise scapular extension, row, 10 reps   Neck Exercises: Supine   Cervical Rotation Both;5 reps   Lateral Flexion Both;5 reps   Manual Therapy   Manual Therapy Myofascial release   Manual therapy comments manual therapy completed prior to therapeutic exercise   Myofascial Release Myofascial release to bilateral upper trapezius and cervical regions to decrease pain and fascial restrictions and increase joint range of motion                OT Education - December 15, 2015 1245    Education provided Yes   Education Details Cervical A/ROM and scapular A/ROM exercises   Person(s) Educated Patient   Methods Explanation;Demonstration;Verbal cues;Handout   Comprehension Returned demonstration;Verbalized understanding          OT Short Term Goals - 12/09/15 1210    OT SHORT TERM GOAL #1   Title Patient will be educated and independent with HEP to increase functional use of neck during daily tasks.   Time 3   Period Weeks   Status On-going   OT SHORT TERM GOAL #2   Title Patient will increase A/ROM of cervical region by 5 degrees as needed in order to look right and left while driving with less  difficulty.    Time 3   Period Weeks   Status On-going   OT SHORT TERM GOAL #3   Title Patient will decrease fascial restrictions in cervical and trapezius region to mod amount to increase functional mobility.    Time 3   Period Weeks   Status On-going           OT Long Term Goals - 12/09/15 1210    OT LONG TERM GOAL #1   Title Pt will return to highest level of independence while using her neck to look right and left when driving with reports of increased mobility and comfort leve.    Time 6   Period Weeks   Status On-going   OT LONG TERM GOAL #2   Title Patient will increase cervical A/ROM by 10 degrees to increase ability to look right and left when driving.    Time 6   Period Weeks   Status On-going   OT LONG TERM GOAL #3   Title Patient will decrease fascial restrictions to min amount in cervical region to increase functional mobility.    Time 6   Period Weeks   Status On-going               Plan - 12/09/15 1209    Clinical Impression Statement A: Initiated myofascial release, manual therapy, P/ROM, A/ROM of cervical and scapular regions. Pt requires consistent verbal and visual cuing for form and technique, increased time needed to redirect pt back to exercises.    Rehab Potential Good   OT Frequency 2x / week   OT Duration 6 weeks   OT Treatment/Interventions Self-care/ADL training;Ultrasound;DME and/or AE instruction;Passive range of motion;Patient/family education;Cryotherapy;Electrical Stimulation;Moist Heat;Therapeutic activities;Therapeutic exercises;Manual Therapy   Plan P: Continue with manual therapy, add cervical stretches      Patient will benefit from skilled therapeutic intervention in order to improve the following deficits and impairments:  Decreased strength, Decreased range of motion, Increased fascial restricitons  Visit Diagnosis: Cervicalgia  Other symptoms and signs involving the musculoskeletal system      G-Codes - 12/15/2015 1253     Functional Assessment Tool Used FOTO score: 51/100 (49% impaired)   Functional Limitation Carrying, moving and handling objects   Carrying, Moving and Handling  Objects Current Status [phone]) At least 40 percent but less than 60 percent impaired, limited or restricted   Carrying, Moving and Handling Objects Goal Status (I1537) At least 20 percent but less than 40 percent impaired, limited or restricted      Problem List Patient Active Problem List   Diagnosis Date Noted  . Acute blood loss anemia 09/10/2014  . GIB (gastrointestinal bleeding) 09/08/2014  . Occlusion and stenosis of carotid artery without mention of cerebral infarction 12/14/2013  . Osteoarthritis of left knee 04/23/2013  . Thumb pain 10/22/2012  . De Quervain's syndrome (tenosynovitis) 10/22/2012  . [facility] DJD(carpometacarpal degenerative joint disease), localized primary 10/22/2012  . CTS (carpal tunnel syndrome) 10/22/2012  . Trigger thumb of right hand 10/22/2012  . Pes anserinus bursitis of right knee 06/26/2012  . OA (osteoarthritis) of knee 06/26/2012  . Leg pain, left 06/26/2012  . Radicular pain of left lower extremity 06/26/2012  . Diverticulosis of colon with hemorrhage 09/22/2011  . Syncopal episodes 09/20/2011  . GI bleed 09/19/2011  . HTN (hypertension) 09/19/2011  . Anemia due to blood loss, acute 09/19/2011  . Abnormality of gait 01/03/2011  . Personal history of fall 12/27/2010  . DISLOCATION CLOSED SHOULDER NEC 04/25/2010  . ANSERINE BURSITIS, LEFT 02/13/2010  . KNEE PAIN 12/22/2009  . LEG PAIN, BILATERAL 12/22/2009  . TOTAL KNEE FOLLOW-UP 12/22/2009  . HIP PAIN 02/07/2009  . LOW BACK PAIN 02/07/2009    Guadelupe Sabin, OTR/L  [phone]  12/09/2015, 12:11 PM  [place] [address] [place], [place], [address] Phone: [phone]   Fax:  [phone]  Name: Cindy Schultz MRN: [id number] Date of Birth: [birth year]

## 2015-12-12 DIAGNOSIS — H04123 Dry eye syndrome of bilateral lacrimal glands: Secondary | ICD-10-CM | POA: Diagnosis not present

## 2015-12-12 DIAGNOSIS — H353190 Nonexudative age-related macular degeneration, unspecified eye, stage unspecified: Secondary | ICD-10-CM | POA: Diagnosis not present

## 2015-12-12 DIAGNOSIS — H401493 Capsular glaucoma with pseudoexfoliation of lens, unspecified eye, severe stage: Secondary | ICD-10-CM | POA: Diagnosis not present

## 2015-12-13 ENCOUNTER — Telehealth (HOSPITAL_COMMUNITY): Payer: Self-pay | Admitting: Occupational Therapy

## 2015-12-13 ENCOUNTER — Ambulatory Visit (HOSPITAL_COMMUNITY): Payer: Medicare Other | Admitting: Occupational Therapy

## 2015-12-13 NOTE — Telephone Encounter (Signed)
Called pt regarding missed appointment, pt reports she is not feeling well and overslept. Reminded pt of next appointment on 6/29 at 11:15.   Guadelupe Sabin, OTR/L  [phone] 12/13/2015

## 2015-12-15 ENCOUNTER — Ambulatory Visit (HOSPITAL_COMMUNITY): Payer: Medicare Other

## 2015-12-15 ENCOUNTER — Encounter (HOSPITAL_COMMUNITY): Payer: Self-pay

## 2015-12-15 DIAGNOSIS — R29898 Other symptoms and signs involving the musculoskeletal system: Secondary | ICD-10-CM | POA: Diagnosis not present

## 2015-12-15 DIAGNOSIS — M542 Cervicalgia: Secondary | ICD-10-CM | POA: Diagnosis not present

## 2015-12-15 NOTE — Therapy (Signed)
[place], [place], [address] Phone: [phone]   Fax:  [phone]  Occupational Therapy Treatment  Patient Details  Name: Cindy Schultz MRN: [id number] Date of Birth: 28-Apr-1924 Referring Provider: Arther Abbott, MD  Encounter Date: 12/15/2015      OT End of Session - 12/15/15 1213    Visit Number 3   Number of Visits 13   Date for OT Re-Evaluation 01/19/16  mini reassess: 01/05/16   Authorization Type Medicare    Authorization Time Period before 10th visit   Authorization - Visit Number 3   Authorization - Number of Visits 10   OT Start Time 1117   OT Stop Time 1200   OT Time Calculation (min) 43 min   Activity Tolerance Patient tolerated treatment well   Behavior During Therapy [facility] for tasks assessed/performed      Past Medical History  Diagnosis Date  . Macular degeneration   . Glaucoma   . Precancerous changes of the cervix     lesions  . Rectocele   . Diverticulitis   . Lumbar spine pain   . Pain in joints   . Hearing loss   . HTN (hypertension)     cholesterol  . Lymphedema   . DVT (deep venous thrombosis) ([place]) 07/14/10    LEA doppler   . SOB (shortness of breath) 04/04/2010    2D echo EF=>55%  . Carotid artery occlusion   . Cancer [facility]) March 2016    Skin Cancer :  Right Cheek     SCC  . Cancer [facility]) 2013    Skin Cancer  :  Anterior Neck   SCC    Past Surgical History  Procedure Laterality Date  . Salk    . Tracheotomy    . Fallen uterus    . Ovarian cyst surgery    . Bladder tac    . Hemmorrhoidectomy    . Met osteostomy    . Left total knee replacement  05/02/04    [name]  . Morton's nueroma    . Elevated metatarsal arch    . Rupture rt groin    . Appendix    . Rectocele repair    . Corneal erosion      cataracts   . Colonoscopy  09/20/2011    Procedure: COLONOSCOPY;  Surgeon: Beryle Beams, MD;  Location: [facility] ENDOSCOPY;  Service: Endoscopy;  Laterality:  N/A;  . Abdominal hysterectomy    . Skin cancer excision Right March 2016  2013    Cheek -  [facility]  and  Anterior Neck    There were no vitals filed for this visit.                    OT Treatments/Exercises (OP) - 12/15/15 1146    Bed Mobility   Bed Mobility Rolling Left;Supine to Sit   Rolling Left 5: Supervision   Supine to Sit 5: Supervision   Supine to Sit Details (indicate cue type and reason) Education given to log roll when getting out of bed or transitioning from sidelying to sitting to decrease strain and pressure on neck and back.   Exercises   Exercises Neck   Neck Exercises: Stretches   Upper Trapezius Stretch 1 rep;10 seconds  Both    Neck Exercises: Seated   Cervical Rotation Both;10 reps   Lateral Flexion Both;10 reps   Shoulder Shrugs 10 reps   Shoulder Rolls Backwards;Forwards;10 reps  Other Seated Exercise cervical flexion/extension, 10 reps   Other Seated Exercise scapular extension, row, 10 reps   Neck Exercises: Supine   Neck Retraction 5 reps  AA/ROM   Cervical Rotation Both;5 reps  P/ROM and A/ROM   Lateral Flexion Both;5 reps  P/ROM   Manual Therapy   Manual Therapy Myofascial release   Manual therapy comments manual therapy completed prior to therapeutic exercise   Myofascial Release Myofascial release to bilateral upper trapezius and cervical regions to decrease pain and fascial restrictions and increase joint range of motion                  OT Short Term Goals - 12/09/15 1210    OT SHORT TERM GOAL #1   Title Patient will be educated and independent with HEP to increase functional use of neck during daily tasks.   Time 3   Period Weeks   Status On-going   OT SHORT TERM GOAL #2   Title Patient will increase A/ROM of cervical region by 5 degrees as needed in order to look right and left while driving with less difficulty.    Time 3   Period Weeks   Status On-going   OT SHORT TERM GOAL #3   Title Patient will  decrease fascial restrictions in cervical and trapezius region to mod amount to increase functional mobility.    Time 3   Period Weeks   Status On-going           OT Long Term Goals - 12/09/15 1210    OT LONG TERM GOAL #1   Title Pt will return to highest level of independence while using her neck to look right and left when driving with reports of increased mobility and comfort leve.    Time 6   Period Weeks   Status On-going   OT LONG TERM GOAL #2   Title Patient will increase cervical A/ROM by 10 degrees to increase ability to look right and left when driving.    Time 6   Period Weeks   Status On-going   OT LONG TERM GOAL #3   Title Patient will decrease fascial restrictions to min amount in cervical region to increase functional mobility.    Time 6   Period Weeks   Status On-going               Plan - 12/15/15 1213    Clinical Impression Statement A: Completed myofascial release to upper trapezius and cervical region. Patient presented with more tightness on right side than left side.    Plan P: Add additional cervical stretches. Add to HEP if appropriate.       Patient will benefit from skilled therapeutic intervention in order to improve the following deficits and impairments:  Decreased strength, Decreased range of motion, Increased fascial restricitons  Visit Diagnosis: Cervicalgia  Other symptoms and signs involving the musculoskeletal system    Problem List Patient Active Problem List   Diagnosis Date Noted  . Acute blood loss anemia 09/10/2014  . GIB (gastrointestinal bleeding) 09/08/2014  . Occlusion and stenosis of carotid artery without mention of cerebral infarction 12/14/2013  . Osteoarthritis of left knee 04/23/2013  . Thumb pain 10/22/2012  . De Quervain's syndrome (tenosynovitis) 10/22/2012  . [facility] DJD(carpometacarpal degenerative joint disease), localized primary 10/22/2012  . CTS (carpal tunnel syndrome) 10/22/2012  . Trigger thumb of  right hand 10/22/2012  . Pes anserinus bursitis of right knee 06/26/2012  . OA (osteoarthritis) of knee 06/26/2012  .  Leg pain, left 06/26/2012  . Radicular pain of left lower extremity 06/26/2012  . Diverticulosis of colon with hemorrhage 09/22/2011  . Syncopal episodes 09/20/2011  . GI bleed 09/19/2011  . HTN (hypertension) 09/19/2011  . Anemia due to blood loss, acute 09/19/2011  . Abnormality of gait 01/03/2011  . Personal history of fall 12/27/2010  . DISLOCATION CLOSED SHOULDER NEC 04/25/2010  . ANSERINE BURSITIS, LEFT 02/13/2010  . KNEE PAIN 12/22/2009  . LEG PAIN, BILATERAL 12/22/2009  . TOTAL KNEE FOLLOW-UP 12/22/2009  . HIP PAIN 02/07/2009  . LOW BACK PAIN 02/07/2009    Ailene Ravel, OTR/L,CBIS  [phone]  12/15/2015, 12:18 PM  [place] [address] [place], [place], [address] Phone: [phone]   Fax:  [phone]  Name: Cindy Schultz MRN: [id number] Date of Birth: 03/13/1924

## 2015-12-21 ENCOUNTER — Ambulatory Visit (HOSPITAL_COMMUNITY): Payer: Medicare Other | Attending: Orthopedic Surgery | Admitting: Occupational Therapy

## 2015-12-21 ENCOUNTER — Encounter (HOSPITAL_COMMUNITY): Payer: Self-pay | Admitting: Occupational Therapy

## 2015-12-21 DIAGNOSIS — R29898 Other symptoms and signs involving the musculoskeletal system: Secondary | ICD-10-CM | POA: Insufficient documentation

## 2015-12-21 DIAGNOSIS — M542 Cervicalgia: Secondary | ICD-10-CM | POA: Diagnosis not present

## 2015-12-21 NOTE — Therapy (Signed)
[place], [place], [address] Phone: [phone]   Fax:  [phone]  Occupational Therapy Treatment  Patient Details  Name: Cindy Schultz MRN: [id number] Date of Birth: May 24, 1924 Referring Provider: Arther Abbott, MD  Encounter Date: 12/21/2015      OT End of Session - 12/21/15 1147    Visit Number 4   Number of Visits 13   Date for OT Re-Evaluation 01/19/16  mini reassess: 01/05/16   Authorization Type Medicare    Authorization Time Period before 10th visit   Authorization - Visit Number 4   Authorization - Number of Visits 10   OT Start Time 1034   OT Stop Time 1117   OT Time Calculation (min) 43 min   Activity Tolerance Patient tolerated treatment well   Behavior During Therapy [facility] for tasks assessed/performed      Past Medical History  Diagnosis Date  . Macular degeneration   . Glaucoma   . Precancerous changes of the cervix     lesions  . Rectocele   . Diverticulitis   . Lumbar spine pain   . Pain in joints   . Hearing loss   . HTN (hypertension)     cholesterol  . Lymphedema   . DVT (deep venous thrombosis) ([place]) 07/14/10    LEA doppler   . SOB (shortness of breath) 04/04/2010    2D echo EF=>55%  . Carotid artery occlusion   . Cancer [facility]) March 2016    Skin Cancer :  Right Cheek     SCC  . Cancer [facility]) 2013    Skin Cancer  :  Anterior Neck   SCC    Past Surgical History  Procedure Laterality Date  . Salk    . Tracheotomy    . Fallen uterus    . Ovarian cyst surgery    . Bladder tac    . Hemmorrhoidectomy    . Met osteostomy    . Left total knee replacement  05/02/04    [name]  . Morton's nueroma    . Elevated metatarsal arch    . Rupture rt groin    . Appendix    . Rectocele repair    . Corneal erosion      cataracts   . Colonoscopy  09/20/2011    Procedure: COLONOSCOPY;  Surgeon: Beryle Beams, MD;  Location: [facility] ENDOSCOPY;  Service: Endoscopy;  Laterality: N/A;   . Abdominal hysterectomy    . Skin cancer excision Right March 2016  2013    Cheek -  [facility]  and  Anterior Neck    There were no vitals filed for this visit.      Subjective Assessment - 12/21/15 1038    Subjective  S: My neck is just stiff, sore at times.    Currently in Pain? No/denies            [facility] OT Assessment - 12/21/15 1038    Assessment   Diagnosis Neck pain   Precautions   Precautions None                  OT Treatments/Exercises (OP) - 12/21/15 1039    Exercises   Exercises Neck   Neck Exercises: Stretches   Upper Trapezius Stretch 2 reps;10 seconds  both   Lower Cervical/Upper Thoracic Stretch 2 reps;10 seconds   Neck Exercises: Seated   Neck Retraction 10 reps   Cervical Rotation Both;10 reps   Lateral Flexion  Both;10 reps   Shoulder Shrugs 10 reps   Shoulder Rolls Backwards;Forwards;10 reps   Other Seated Exercise cervical flexion/extension, 10 reps   Other Seated Exercise scapular extension, row, 10 reps   Neck Exercises: Supine   Cervical Rotation Both;5 reps  P/ROM and A/ROM   Lateral Flexion Both;5 reps  P/ROM   Manual Therapy   Manual Therapy Myofascial release   Manual therapy comments manual therapy completed prior to therapeutic exercise   Myofascial Release Myofascial release to bilateral upper trapezius and cervical regions to decrease pain and fascial restrictions and increase joint range of motion                  OT Short Term Goals - 12/09/15 1210    OT SHORT TERM GOAL #1   Title Patient will be educated and independent with HEP to increase functional use of neck during daily tasks.   Time 3   Period Weeks   Status On-going   OT SHORT TERM GOAL #2   Title Patient will increase A/ROM of cervical region by 5 degrees as needed in order to look right and left while driving with less difficulty.    Time 3   Period Weeks   Status On-going   OT SHORT TERM GOAL #3   Title Patient will decrease fascial  restrictions in cervical and trapezius region to mod amount to increase functional mobility.    Time 3   Period Weeks   Status On-going           OT Long Term Goals - 12/09/15 1210    OT LONG TERM GOAL #1   Title Pt will return to highest level of independence while using her neck to look right and left when driving with reports of increased mobility and comfort leve.    Time 6   Period Weeks   Status On-going   OT LONG TERM GOAL #2   Title Patient will increase cervical A/ROM by 10 degrees to increase ability to look right and left when driving.    Time 6   Period Weeks   Status On-going   OT LONG TERM GOAL #3   Title Patient will decrease fascial restrictions to min amount in cervical region to increase functional mobility.    Time 6   Period Weeks   Status On-going               Plan - 12/21/15 1147    Clinical Impression Statement A: Pt reports pain is greater on the left side today, complains of "knot" on side of neck. Continued with P/ROM, A/ROM, and neck stretches this session, pt requires verbal and visual cuing for form and technique, as well as verbal cuing to remain on task. Did not add new stretch to HEP this session due to cuing required to complete.    Rehab Potential Good   OT Frequency 2x / week   OT Duration 6 weeks   OT Treatment/Interventions Self-care/ADL training;Ultrasound;DME and/or AE instruction;Passive range of motion;Patient/family education;Cryotherapy;Electrical Stimulation;Moist Heat;Therapeutic activities;Therapeutic exercises;Manual Therapy   Plan P: Continue A/ROM exercises, add levator stretch, update HEP for stretches when appropriate.    Consulted and Agree with Plan of Care Patient      Patient will benefit from skilled therapeutic intervention in order to improve the following deficits and impairments:  Decreased strength, Decreased range of motion, Increased fascial restricitons  Visit Diagnosis: Other symptoms and signs  involving the musculoskeletal system  Cervicalgia    Problem  List Patient Active Problem List   Diagnosis Date Noted  . Acute blood loss anemia 09/10/2014  . GIB (gastrointestinal bleeding) 09/08/2014  . Occlusion and stenosis of carotid artery without mention of cerebral infarction 12/14/2013  . Osteoarthritis of left knee 04/23/2013  . Thumb pain 10/22/2012  . De Quervain's syndrome (tenosynovitis) 10/22/2012  . [facility] DJD(carpometacarpal degenerative joint disease), localized primary 10/22/2012  . CTS (carpal tunnel syndrome) 10/22/2012  . Trigger thumb of right hand 10/22/2012  . Pes anserinus bursitis of right knee 06/26/2012  . OA (osteoarthritis) of knee 06/26/2012  . Leg pain, left 06/26/2012  . Radicular pain of left lower extremity 06/26/2012  . Diverticulosis of colon with hemorrhage 09/22/2011  . Syncopal episodes 09/20/2011  . GI bleed 09/19/2011  . HTN (hypertension) 09/19/2011  . Anemia due to blood loss, acute 09/19/2011  . Abnormality of gait 01/03/2011  . Personal history of fall 12/27/2010  . DISLOCATION CLOSED SHOULDER NEC 04/25/2010  . ANSERINE BURSITIS, LEFT 02/13/2010  . KNEE PAIN 12/22/2009  . LEG PAIN, BILATERAL 12/22/2009  . TOTAL KNEE FOLLOW-UP 12/22/2009  . HIP PAIN 02/07/2009  . LOW BACK PAIN 02/07/2009   Guadelupe Sabin, OTR/L  [phone]  12/21/2015, 11:50 AM  [place] [address] [place], [place], [address] Phone: [phone]   Fax:  [phone]  Name: Cindy Schultz MRN: [id number] Date of Birth: 19-Aug-1923

## 2015-12-23 ENCOUNTER — Ambulatory Visit (HOSPITAL_COMMUNITY): Payer: Medicare Other

## 2015-12-23 ENCOUNTER — Telehealth (HOSPITAL_COMMUNITY): Payer: Self-pay

## 2015-12-23 NOTE — Telephone Encounter (Signed)
Called and left message regarding no show. Left message on answering machine informing patient that due to this being her 2nd no show she will only be able to keep one appointment at a time. Reminded patient of next appointment.   Ailene Ravel, OTR/L,CBIS  [phone]

## 2015-12-26 ENCOUNTER — Encounter (HOSPITAL_COMMUNITY): Payer: Self-pay

## 2015-12-26 ENCOUNTER — Ambulatory Visit (HOSPITAL_COMMUNITY): Payer: Medicare Other

## 2015-12-26 DIAGNOSIS — R29898 Other symptoms and signs involving the musculoskeletal system: Secondary | ICD-10-CM | POA: Diagnosis not present

## 2015-12-26 DIAGNOSIS — M542 Cervicalgia: Secondary | ICD-10-CM

## 2015-12-26 NOTE — Therapy (Signed)
[place], [place], [address] Phone: [phone]   Fax:  [phone]  Occupational Therapy Treatment  Patient Details  Name: Cindy Schultz MRN: [id number] Date of Birth: [birth year] Referring Provider: Arther Abbott, MD  Encounter Date: 12/26/2015      OT End of Session - 12/26/15 1216    Visit Number 5   Number of Visits 13   Date for OT Re-Evaluation 01/19/16  mini reassess: 01/05/16   Authorization Type Medicare    Authorization Time Period before 10th visit   Authorization - Visit Number 5   Authorization - Number of Visits 10   OT Start Time 1045   OT Stop Time 1130   OT Time Calculation (min) 45 min   Activity Tolerance Patient tolerated treatment well   Behavior During Therapy [facility] for tasks assessed/performed      Past Medical History  Diagnosis Date  . Macular degeneration   . Glaucoma   . Precancerous changes of the cervix     lesions  . Rectocele   . Diverticulitis   . Lumbar spine pain   . Pain in joints   . Hearing loss   . HTN (hypertension)     cholesterol  . Lymphedema   . DVT (deep venous thrombosis) ([place]) 07/14/10    LEA doppler   . SOB (shortness of breath) 04/04/2010    2D echo EF=>55%  . Carotid artery occlusion   . Cancer [facility]) March 2016    Skin Cancer :  Right Cheek     SCC  . Cancer [facility]) 2013    Skin Cancer  :  Anterior Neck   SCC    Past Surgical History  Procedure Laterality Date  . Salk    . Tracheotomy    . Fallen uterus    . Ovarian cyst surgery    . Bladder tac    . Hemmorrhoidectomy    . Met osteostomy    . Left total knee replacement  05/02/04    [name]  . Morton's nueroma    . Elevated metatarsal arch    . Rupture rt groin    . Appendix    . Rectocele repair    . Corneal erosion      cataracts   . Colonoscopy  09/20/2011    Procedure: COLONOSCOPY;  Surgeon: Beryle Beams, MD;  Location: [facility] ENDOSCOPY;  Service: Endoscopy;  Laterality:  N/A;  . Abdominal hysterectomy    . Skin cancer excision Right March 2016  2013    Cheek -  [facility]  and  Anterior Neck    There were no vitals filed for this visit.      Subjective Assessment - 12/26/15 1215    Subjective  S: It doesn't hurt. It's just very stiff.    Currently in Pain? No/denies            [facility] OT Assessment - 12/26/15 1214    Assessment   Diagnosis Neck pain   Precautions   Precautions None                  OT Treatments/Exercises (OP) - 12/26/15 1116    Exercises   Exercises Neck   Neck Exercises: Seated   Lateral Flexion Both;10 reps   Shoulder Shrugs 10 reps   Neck Exercises: Supine   Cervical Rotation Both;5 reps  A/ROM and P/ROM   Lateral Flexion Both;5 reps  P/ROM   X to V 10  reps   Other Supine Exercise Shoulder protraction 10X   Manual Therapy   Manual Therapy Myofascial release   Manual therapy comments manual therapy completed prior to therapeutic exercise                  OT Short Term Goals - 12/09/15 1210    OT SHORT TERM GOAL #1   Title Patient will be educated and independent with HEP to increase functional use of neck during daily tasks.   Time 3   Period Weeks   Status On-going   OT SHORT TERM GOAL #2   Title Patient will increase A/ROM of cervical region by 5 degrees as needed in order to look right and left while driving with less difficulty.    Time 3   Period Weeks   Status On-going   OT SHORT TERM GOAL #3   Title Patient will decrease fascial restrictions in cervical and trapezius region to mod amount to increase functional mobility.    Time 3   Period Weeks   Status On-going           OT Long Term Goals - 12/09/15 1210    OT LONG TERM GOAL #1   Title Pt will return to highest level of independence while using her neck to look right and left when driving with reports of increased mobility and comfort leve.    Time 6   Period Weeks   Status On-going   OT LONG TERM GOAL #2   Title  Patient will increase cervical A/ROM by 10 degrees to increase ability to look right and left when driving.    Time 6   Period Weeks   Status On-going   OT LONG TERM GOAL #3   Title Patient will decrease fascial restrictions to min amount in cervical region to increase functional mobility.    Time 6   Period Weeks   Status On-going               Plan - 12/26/15 1216    Clinical Impression Statement A: Pt continues to require constant VC and physical cues to complete exercises. Unable to isolate specific movements without cues.    Plan P: continue with plan of care. Complete A/ROM exercise and levator stretch.       Patient will benefit from skilled therapeutic intervention in order to improve the following deficits and impairments:  Decreased strength, Decreased range of motion, Increased fascial restricitons  Visit Diagnosis: Other symptoms and signs involving the musculoskeletal system  Cervicalgia    Problem List Patient Active Problem List   Diagnosis Date Noted  . Acute blood loss anemia 09/10/2014  . GIB (gastrointestinal bleeding) 09/08/2014  . Occlusion and stenosis of carotid artery without mention of cerebral infarction 12/14/2013  . Osteoarthritis of left knee 04/23/2013  . Thumb pain 10/22/2012  . De Quervain's syndrome (tenosynovitis) 10/22/2012  . [facility] DJD(carpometacarpal degenerative joint disease), localized primary 10/22/2012  . CTS (carpal tunnel syndrome) 10/22/2012  . Trigger thumb of right hand 10/22/2012  . Pes anserinus bursitis of right knee 06/26/2012  . OA (osteoarthritis) of knee 06/26/2012  . Leg pain, left 06/26/2012  . Radicular pain of left lower extremity 06/26/2012  . Diverticulosis of colon with hemorrhage 09/22/2011  . Syncopal episodes 09/20/2011  . GI bleed 09/19/2011  . HTN (hypertension) 09/19/2011  . Anemia due to blood loss, acute 09/19/2011  . Abnormality of gait 01/03/2011  . Personal history of fall 12/27/2010  .  DISLOCATION  CLOSED SHOULDER NEC 04/25/2010  . ANSERINE BURSITIS, LEFT 02/13/2010  . KNEE PAIN 12/22/2009  . LEG PAIN, BILATERAL 12/22/2009  . TOTAL KNEE FOLLOW-UP 12/22/2009  . HIP PAIN 02/07/2009  . LOW BACK PAIN 02/07/2009    Ailene Ravel, OTR/L,CBIS  [phone]  12/26/2015, 12:18 PM  [facility] [address] [place], [place], [address] Phone: [phone]   Fax:  [phone]  Name: Cindy Schultz MRN: [id number] Date of Birth: [birth year]

## 2015-12-27 ENCOUNTER — Ambulatory Visit (INDEPENDENT_AMBULATORY_CARE_PROVIDER_SITE_OTHER): Payer: Medicare Other | Admitting: Ophthalmology

## 2015-12-27 DIAGNOSIS — H353132 Nonexudative age-related macular degeneration, bilateral, intermediate dry stage: Secondary | ICD-10-CM

## 2015-12-27 DIAGNOSIS — H43813 Vitreous degeneration, bilateral: Secondary | ICD-10-CM

## 2015-12-27 DIAGNOSIS — I10 Essential (primary) hypertension: Secondary | ICD-10-CM | POA: Diagnosis not present

## 2015-12-27 DIAGNOSIS — H35033 Hypertensive retinopathy, bilateral: Secondary | ICD-10-CM

## 2015-12-29 ENCOUNTER — Encounter: Payer: Self-pay | Admitting: Family

## 2015-12-30 ENCOUNTER — Encounter (HOSPITAL_COMMUNITY): Payer: Medicare Other

## 2015-12-30 ENCOUNTER — Encounter (HOSPITAL_COMMUNITY): Payer: Self-pay | Admitting: Occupational Therapy

## 2015-12-30 ENCOUNTER — Ambulatory Visit (HOSPITAL_COMMUNITY): Payer: Medicare Other | Admitting: Occupational Therapy

## 2015-12-30 DIAGNOSIS — R29898 Other symptoms and signs involving the musculoskeletal system: Secondary | ICD-10-CM | POA: Diagnosis not present

## 2015-12-30 DIAGNOSIS — M542 Cervicalgia: Secondary | ICD-10-CM | POA: Diagnosis not present

## 2015-12-30 NOTE — Therapy (Signed)
[place], [place], [address] Phone: [phone]   Fax:  [phone]  Occupational Therapy Treatment  Patient Details  Name: Cindy Schultz MRN: [id number] Date of Birth: 01-07-1924 Referring Provider: Arther Abbott, MD  Encounter Date: 12/30/2015      OT End of Session - 12/30/15 1208    Visit Number 6   Number of Visits 13   Date for OT Re-Evaluation 01/19/16  mini reassess: 01/05/16   Authorization Type Medicare    Authorization Time Period before 10th visit   Authorization - Visit Number 6   Authorization - Number of Visits 10   OT Start Time 1116   OT Stop Time 1201   OT Time Calculation (min) 45 min   Activity Tolerance Patient tolerated treatment well   Behavior During Therapy [facility] for tasks assessed/performed      Past Medical History  Diagnosis Date  . Macular degeneration   . Glaucoma   . Precancerous changes of the cervix     lesions  . Rectocele   . Diverticulitis   . Lumbar spine pain   . Pain in joints   . Hearing loss   . HTN (hypertension)     cholesterol  . Lymphedema   . DVT (deep venous thrombosis) ([place]) 07/14/10    LEA doppler   . SOB (shortness of breath) 04/04/2010    2D echo EF=>55%  . Carotid artery occlusion   . Cancer [facility]) March 2016    Skin Cancer :  Right Cheek     SCC  . Cancer [facility]) 2013    Skin Cancer  :  Anterior Neck   SCC    Past Surgical History  Procedure Laterality Date  . Salk    . Tracheotomy    . Fallen uterus    . Ovarian cyst surgery    . Bladder tac    . Hemmorrhoidectomy    . Met osteostomy    . Left total knee replacement  05/02/04    [name]  . Morton's nueroma    . Elevated metatarsal arch    . Rupture rt groin    . Appendix    . Rectocele repair    . Corneal erosion      cataracts   . Colonoscopy  09/20/2011    Procedure: COLONOSCOPY;  Surgeon: Beryle Beams, MD;  Location: [facility] ENDOSCOPY;  Service: Endoscopy;  Laterality:  N/A;  . Abdominal hysterectomy    . Skin cancer excision Right March 2016  2013    Cheek -  [facility]  and  Anterior Neck    There were no vitals filed for this visit.      Subjective Assessment - 12/30/15 1113    Subjective  S: I did my exercises this morning.    Currently in Pain? No/denies            [facility] OT Assessment - 12/30/15 1113    Assessment   Diagnosis Neck pain   Precautions   Precautions None                  OT Treatments/Exercises (OP) - 12/30/15 1126    Exercises   Exercises Neck   Neck Exercises: Stretches   Levator Stretch 2 reps;10 seconds  both   Neck Exercises: Seated   Cervical Rotation Both;5 reps   Lateral Flexion Right;5 reps   Shoulder Shrugs 10 reps   Shoulder Flexion Both;10 reps   Other Seated  Exercise shoulder protraction 10X   Other Seated Exercise scapular retraction   Neck Exercises: Supine   Cervical Rotation Both;5 reps  P/ROM and A/ROM   Lateral Flexion Both;5 reps  P/ROM and A/ROM   X to V 10 reps   Other Supine Exercise Shoulder protraction 10X   Manual Therapy   Manual Therapy Myofascial release   Manual therapy comments manual therapy completed prior to therapeutic exercise   Myofascial Release Myofascial release to bilateral upper trapezius and cervical regions to decrease pain and fascial restrictions and increase joint range of motion                  OT Short Term Goals - 12/09/15 1210    OT SHORT TERM GOAL #1   Title Patient will be educated and independent with HEP to increase functional use of neck during daily tasks.   Time 3   Period Weeks   Status On-going   OT SHORT TERM GOAL #2   Title Patient will increase A/ROM of cervical region by 5 degrees as needed in order to look right and left while driving with less difficulty.    Time 3   Period Weeks   Status On-going   OT SHORT TERM GOAL #3   Title Patient will decrease fascial restrictions in cervical and trapezius region to mod amount to  increase functional mobility.    Time 3   Period Weeks   Status On-going           OT Long Term Goals - 12/09/15 1210    OT LONG TERM GOAL #1   Title Pt will return to highest level of independence while using her neck to look right and left when driving with reports of increased mobility and comfort leve.    Time 6   Period Weeks   Status On-going   OT LONG TERM GOAL #2   Title Patient will increase cervical A/ROM by 10 degrees to increase ability to look right and left when driving.    Time 6   Period Weeks   Status On-going   OT LONG TERM GOAL #3   Title Patient will decrease fascial restrictions to min amount in cervical region to increase functional mobility.    Time 6   Period Weeks   Status On-going               Plan - 12/30/15 1208    Clinical Impression Statement A: Added levator stretch and A/ROM shoulder flexion this session. Pt requires constant verbal and visual cuing for correct completion of exercises. Pt reports she is not having pain in her neck, only stiffness. Pt made next appointment and was given reminder in large print.    Rehab Potential Good   OT Frequency 2x / week   OT Duration 6 weeks   OT Treatment/Interventions Self-care/ADL training;Ultrasound;DME and/or AE instruction;Passive range of motion;Patient/family education;Cryotherapy;Electrical Stimulation;Moist Heat;Therapeutic activities;Therapeutic exercises;Manual Therapy   Plan P: Mini-reassessment. Continue with A/ROM and cervical stretches. Work on increasing independence in completion.    Consulted and Agree with Plan of Care Patient      Patient will benefit from skilled therapeutic intervention in order to improve the following deficits and impairments:  Decreased strength, Decreased range of motion, Increased fascial restricitons  Visit Diagnosis: Other symptoms and signs involving the musculoskeletal system  Cervicalgia    Problem List Patient Active Problem List    Diagnosis Date Noted  . Acute blood loss anemia 09/10/2014  . GIB (  gastrointestinal bleeding) 09/08/2014  . Occlusion and stenosis of carotid artery without mention of cerebral infarction 12/14/2013  . Osteoarthritis of left knee 04/23/2013  . Thumb pain 10/22/2012  . De Quervain's syndrome (tenosynovitis) 10/22/2012  . [facility] DJD(carpometacarpal degenerative joint disease), localized primary 10/22/2012  . CTS (carpal tunnel syndrome) 10/22/2012  . Trigger thumb of right hand 10/22/2012  . Pes anserinus bursitis of right knee 06/26/2012  . OA (osteoarthritis) of knee 06/26/2012  . Leg pain, left 06/26/2012  . Radicular pain of left lower extremity 06/26/2012  . Diverticulosis of colon with hemorrhage 09/22/2011  . Syncopal episodes 09/20/2011  . GI bleed 09/19/2011  . HTN (hypertension) 09/19/2011  . Anemia due to blood loss, acute 09/19/2011  . Abnormality of gait 01/03/2011  . Personal history of fall 12/27/2010  . DISLOCATION CLOSED SHOULDER NEC 04/25/2010  . ANSERINE BURSITIS, LEFT 02/13/2010  . KNEE PAIN 12/22/2009  . LEG PAIN, BILATERAL 12/22/2009  . TOTAL KNEE FOLLOW-UP 12/22/2009  . HIP PAIN 02/07/2009  . LOW BACK PAIN 02/07/2009    Guadelupe Sabin, OTR/L  [phone]  12/30/2015, 12:12 PM  [place] [address] [place], [place], [address] Phone: [phone]   Fax:  [phone]  Name: JOSCELYNE RENVILLE MRN: [id number] Date of Birth: [birth year]

## 2016-01-02 ENCOUNTER — Encounter: Payer: Self-pay | Admitting: Family

## 2016-01-02 ENCOUNTER — Ambulatory Visit (INDEPENDENT_AMBULATORY_CARE_PROVIDER_SITE_OTHER): Payer: Medicare Other | Admitting: Family

## 2016-01-02 ENCOUNTER — Ambulatory Visit (HOSPITAL_COMMUNITY)
Admission: RE | Admit: 2016-01-02 | Discharge: 2016-01-02 | Disposition: A | Discharge status: Home / Self Care | Payer: Medicare Other | Source: Ambulatory Visit | Attending: Family | Admitting: Family

## 2016-01-02 VITALS — BP 125/68 | HR 52 | Temp 97.0°F | Resp 16 | Ht 63.0 in | Wt 129.3 lb

## 2016-01-02 DIAGNOSIS — I10 Essential (primary) hypertension: Secondary | ICD-10-CM | POA: Insufficient documentation

## 2016-01-02 DIAGNOSIS — I6523 Occlusion and stenosis of bilateral carotid arteries: Secondary | ICD-10-CM | POA: Insufficient documentation

## 2016-01-02 DIAGNOSIS — R0989 Other specified symptoms and signs involving the circulatory and respiratory systems: Secondary | ICD-10-CM | POA: Diagnosis not present

## 2016-01-02 LAB — VAS US CAROTID
LCCADSYS: 77 cm/s
LCCAPDIAS: 18 cm/s
LEFT ECA DIAS: -11 cm/s
LEFT VERTEBRAL DIAS: 16 cm/s
LICAPDIAS: 31 cm/s
LICAPSYS: 156 cm/s
Left CCA dist dias: 17 cm/s
Left CCA prox sys: 85 cm/s
RCCAPDIAS: 16 cm/s
RIGHT CCA MID DIAS: 18 cm/s
RIGHT ECA DIAS: 9 cm/s
RIGHT VERTEBRAL DIAS: -18 cm/s
Right CCA prox sys: 84 cm/s
Right cca dist sys: -103 cm/s

## 2016-01-02 NOTE — Progress Notes (Signed)
Chief Complaint: Follow up Extracranial Carotid Artery Stenosis   History of Present Illness  Cindy Schultz is a 82 y.o. female patient whom Dr. Trula Slade initially evaluated in June 2015. She was referred for carotid plaque. The patient has a history of hoarseness as well as a sinus disease. She underwent a CT scan which revealed a severely calcified plaque at the left carotid bifurcation. The patient denies a history of symptoms of a stroke or TIA. Specifically she denies numbness or weakness in either extremity. She denies slurred speech. She denies amaurosis fugax. Based on the ultrasound at the patient's June 2015 visit, Dr. Trula Slade did not think there was a hemodynamically significant stenosis at the left common carotid artery where the plaque was identified. Dr. Trula Slade did think she should be followed on an annual basis to make sure that these velocity profiles remained consistent. Certainly should she develop symptoms, she may require a more invasive test such as a CT angiogram or catheter-based angiography.Dr. Trula Slade did not think either was required at that time as she remained asymptomatic. Greater than 80% stenosis would be the threshold for intervention.  Patient has a history of DVT which was treated with 6 months of Coumadin. She is medically managed for hypercholesterolemia and hypertension. She is a nonsmoker.  Patient has not had previous carotid artery intervention.  The patient reports New Medical or Surgical History: saw her podiatrist in June or July 2017 for a left hammertoe; states the podiatrist told her she had no pulse in her left foot and therefore she could not her her hammertoe repaired. She had speech therapy for her hoarseness.  In 2013 she had a vaginal hysterectomy for uterine prolapse and left inguinal herniorrhaphy.   She does not seem to have claudication symptoms with walking, her legs doe get tired with walking a moderate distance, but  does have right leg cramping at night.  Pt Diabetic: no Pt smoker: non-smoker  Pt meds include: Statin : no, states she had too much hair loss with a statin use, is now taking an OTC supplement for her cholesterol and she states her cholesterol has been OK. ASA: no, she cannot take ASA due to history of GI bleed from her colon Other anticoagulants/antiplatelets: no   Past Medical History  Diagnosis Date  . Macular degeneration   . Glaucoma   . Precancerous changes of the cervix     lesions  . Rectocele   . Diverticulitis   . Lumbar spine pain   . Pain in joints   . Hearing loss   . HTN (hypertension)     cholesterol  . Lymphedema   . DVT (deep venous thrombosis) ([place]) 07/14/10    LEA doppler   . SOB (shortness of breath) 04/04/2010    2D echo EF=>55%  . Carotid artery occlusion   . Cancer [facility]) March 2016    Skin Cancer :  Right Cheek     SCC  . Cancer [facility]) 2013    Skin Cancer  :  Anterior Neck   SCC    Social History Social History  Substance Use Topics  . Smoking status: Never Smoker   . Smokeless tobacco: Never Used  . Alcohol Use: No    Family History Family History  Problem Relation Age of Onset  . Stroke Mother   . Diabetes Mother   . Heart attack Father   . COPD Paternal Grandfather   . Dementia Sister     Surgical History Past Surgical  History  Procedure Laterality Date  . Salk    . Tracheotomy    . Fallen uterus    . Ovarian cyst surgery    . Bladder tac    . Hemmorrhoidectomy    . Met osteostomy    . Left total knee replacement  05/02/04    [name]  . Morton's nueroma    . Elevated metatarsal arch    . Rupture rt groin    . Appendix    . Rectocele repair    . Corneal erosion      cataracts   . Colonoscopy  09/20/2011    Procedure: COLONOSCOPY;  Surgeon: Beryle Beams, MD;  Location: [facility] ENDOSCOPY;  Service: Endoscopy;  Laterality: N/A;  . Abdominal hysterectomy    . Skin cancer excision Right March 2016  2013    Cheek -  [facility]  and   Anterior Neck    Allergies  Allergen Reactions  . Promethazine Hcl Nausea And Vomiting and Other (See Comments)    REACTION: deathly sick  . Codeine Nausea And Vomiting  . Tramadol Hcl Nausea And Vomiting and Other (See Comments)    Motion Sickness  . Hydrocodone Other (See Comments)    Unknown Reaction     Current Outpatient Prescriptions  Medication Sig Dispense Refill  . acetaminophen (TYLENOL) 500 MG tablet Take 500 mg by mouth every 6 (six) hours as needed (pain).     . Calcium 600-200 MG-UNIT per tablet Take 1 tablet by mouth daily.      . dorzolamide-timolol (COSOPT) 22.3-6.8 MG/ML ophthalmic solution Place 1 drop into both eyes 2 (two) times daily.      . ferrous sulfate 325 (65 FE) MG tablet Take 325 mg by mouth daily with breakfast. Reported on 12/08/2015    . fluticasone (FLONASE) 50 MCG/ACT nasal spray Place 2 sprays into the nose daily.      Marland Kitchen gabapentin (NEURONTIN) 100 MG capsule Take 1 capsule (100 mg total) by mouth at bedtime. 30 capsule 5  . glucosamine-chondroitin 500-400 MG tablet Take 1 tablet by mouth daily.     Marland Kitchen latanoprost (XALATAN) 0.005 % ophthalmic solution Place 1 drop into the right eye at bedtime.     Marland Kitchen losartan (COZAAR) 50 MG tablet     . LUMIGAN 0.01 % SOLN     . Multiple Vitamins-Minerals (OCUVITE PO) Take 1 tablet by mouth daily.    Marland Kitchen NIFEdipine (PROCARDIA-XL/ADALAT-CC/NIFEDICAL-XL) 30 MG 24 hr tablet Take 1 tablet by mouth daily.    Marland Kitchen oxybutynin (DITROPAN-XL) 5 MG 24 hr tablet Take 5 mg by mouth daily.    Vladimir Faster Glycol-Propyl Glycol (SYSTANE ULTRA OP) Apply 1 drop to eye 3 (three) times daily.     . promethazine (PHENERGAN) 12.5 MG tablet Take 1 tablet (12.5 mg total) by mouth every 6 (six) hours as needed for nausea or vomiting. 60 tablet 2  . traMADol (ULTRAM) 50 MG tablet Take 1 tablet (50 mg total) by mouth every 6 (six) hours as needed. 60 tablet 5   No current facility-administered medications for this visit.    Review of Systems : See  HPI for pertinent positives and negatives.  Physical Examination  Filed Vitals:   01/02/16 1123  BP: 125/68  Pulse: 52  Temp: 97 F (36.1 C)  TempSrc: Oral  Resp: 16  Height: '5\' 3"'  (1.6 m)  Weight: 129 lb 4.8 oz (58.65 kg)  SpO2: 95%   Body mass index is 22.91 kg/(m^2).  General: WDWN elderly  female in NAD GAIT: slow and deliberate Eyes: PERRLA Pulmonary: Respirations are non-labored, CTAB, no rales, rhonchi, or wheezing.  Cardiac: regular rhythm, no detected murmur.  VASCULAR EXAM Carotid Bruits Right Left   Negative Negative   Aorta is not palpable. Radial pulses are 2+ palpable and equal.      LE Pulses Right Left       FEMORAL 3+ palpable 3+ palpable   POPLITEAL not palpable  not palpable       PT  2+ palpable  not palpable       DP  2+ palpable  1+ palpable    Gastrointestinal: soft, nontender, BS WNL, no r/g, no palpable masses.  Musculoskeletal: age appropriate muscle atrophy/wasting. M/S 4/5 throughout, extremities without ischemic changes.  Neurologic: A&O X 3; Appropriate Affect, Speech is normal CN 2-12 intact except is hard of hearing, pain and light touch intact in extremities, Motor exam as listed above. Loquacious.         Non-Invasive Vascular Imaging CAROTID DUPLEX 01/02/2016   CEREBROVASCULAR DUPLEX EVALUATION    INDICATION: Carotid artery disease    PREVIOUS INTERVENTION(S):     DUPLEX EXAM: Carotid duplex    RIGHT  LEFT  Peak Systolic Velocities (cm/s) End Diastolic Velocities (cm/s) Plaque LOCATION Peak Systolic Velocities (cm/s) End Diastolic Velocities (cm/s) Plaque  84 16  CCA PROXIMAL 85 18   89 18  CCA MID 67 19   80 18 HT CCA DISTAL 77 17 CP  79 9 HT ECA 113 7 CP  81 20 HT ICA PROXIMAL 141 42 CP  105 30 curve  ICA MID 136 42   103 29  ICA DISTAL 87 28     0.91 ICA / CCA  Ratio (PSV) 2.1  Antegrade Vertebral Flow Antegrade  - Brachial Systolic Pressure (mmHg) -  Triphasic Brachial Artery Waveforms Triphasic    Plaque Morphology:  HM = Homogeneous, HT = Heterogeneous, CP = Calcific Plaque, SP = Smooth Plaque, IP = Irregular Plaque     ADDITIONAL FINDINGS: Multiphasic subclavian arteries    IMPRESSION: 1. Less than 40% right  carotid artery stenosis, calcific plaque may obscure higher velocity 2. 40 - 59% left internal carotid artery stenosis.    Compared to the previous exam:  Increase of velocity on the left  since exam of 12/27/2014      Assessment: Cindy Schultz is a 82 y.o. female who has no history of stroke or TIA. Carotid stenosis was found on CT imaging as part of an evaluation for hoarseness and sinus disease.  Today's carotid Duplex suggests less than 40% right  carotid artery stenosis, calcific plaque may obscure higher velocity and 40 - 59% left internal carotid artery stenosis. Increased stenosis of the left internal carotid artery since exam of 12/27/2014.  Her atherosclerotic risk factors include advanced age, hypertension, and family history of stroke and CAD. Fortunately she does not have DM and has never used tobacco.  Pt states she could not have hammertoe surgery on a left hammertoe since her podiatrist could not feel pulses in her left foot.  Pt has 1+ palpable left DP pulse, left PT pulse is not palpable; she has 2+ PT and DP pulses in her right foot.  We can perform ABI's when she returns in a year for her carotid duplex. If hammertoe surgery is contemplated sooner than a year, can perform ABI's sooner.  Pt does not seem to have claudication symptoms with walking, has no signs of ischemia in her  feet/legs.   Plan: Follow-up in 1 year with Carotid Duplex scan.   I discussed in depth with the patient the nature of atherosclerosis, and emphasized the importance of maximal medical management including strict control of blood  pressure, blood glucose, and lipid levels, obtaining regular exercise, and continued cessation of smoking.  The patient is aware that without maximal medical management the underlying atherosclerotic disease process will progress, limiting the benefit of any interventions. The patient was given information about stroke prevention and what symptoms should prompt the patient to seek immediate medical care. Thank you for allowing [place] to participate in this patient's care.  Clemon Chambers, RN, MSN, FNP-C [facility] Office: [facility] Physician: Trula Slade  01/02/2016 11:28 AM

## 2016-01-02 NOTE — Patient Instructions (Signed)
Stroke Prevention Some medical conditions and behaviors are associated with an increased chance of having a stroke. You may prevent a stroke by making healthy choices and managing medical conditions. HOW CAN I REDUCE MY RISK OF HAVING A STROKE?   Stay physically active. Get at least 30 minutes of activity on most or all days.  Do not smoke. It may also be helpful to avoid exposure to secondhand smoke.  Limit alcohol use. Moderate alcohol use is considered to be:  No more than 2 drinks per day for men.  No more than 1 drink per day for nonpregnant women.  Eat healthy foods. This involves:  Eating 5 or more servings of fruits and vegetables a day.  Making dietary changes that address high blood pressure (hypertension), high cholesterol, diabetes, or obesity.  Manage your cholesterol levels.  Making food choices that are high in fiber and low in saturated fat, trans fat, and cholesterol may control cholesterol levels.  Take any prescribed medicines to control cholesterol as directed by your health care provider.  Manage your diabetes.  Controlling your carbohydrate and sugar intake is recommended to manage diabetes.  Take any prescribed medicines to control diabetes as directed by your health care provider.  Control your hypertension.  Making food choices that are low in salt (sodium), saturated fat, trans fat, and cholesterol is recommended to manage hypertension.  Ask your health care provider if you need treatment to lower your blood pressure. Take any prescribed medicines to control hypertension as directed by your health care provider.  If you are 18-39 years of age, have your blood pressure checked every 3-5 years. If you are 40 years of age or older, have your blood pressure checked every year.  Maintain a healthy weight.  Reducing calorie intake and making food choices that are low in sodium, saturated fat, trans fat, and cholesterol are recommended to manage  weight.  Stop drug abuse.  Avoid taking birth control pills.  Talk to your health care provider about the risks of taking birth control pills if you are over 35 years old, smoke, get migraines, or have ever had a blood clot.  Get evaluated for sleep disorders (sleep apnea).  Talk to your health care provider about getting a sleep evaluation if you snore a lot or have excessive sleepiness.  Take medicines only as directed by your health care provider.  For some people, aspirin or blood thinners (anticoagulants) are helpful in reducing the risk of forming abnormal blood clots that can lead to stroke. If you have the irregular heart rhythm of atrial fibrillation, you should be on a blood thinner unless there is a good reason you cannot take them.  Understand all your medicine instructions.  Make sure that other conditions (such as anemia or atherosclerosis) are addressed. SEEK IMMEDIATE MEDICAL CARE IF:   You have sudden weakness or numbness of the face, arm, or leg, especially on one side of the body.  Your face or eyelid droops to one side.  You have sudden confusion.  You have trouble speaking (aphasia) or understanding.  You have sudden trouble seeing in one or both eyes.  You have sudden trouble walking.  You have dizziness.  You have a loss of balance or coordination.  You have a sudden, severe headache with no known cause.  You have new chest pain or an irregular heartbeat. Any of these symptoms may represent a serious problem that is an emergency. Do not wait to see if the symptoms will   go away. Get medical help at once. Call your local emergency services (911 in [place] Do not drive yourself to the hospital.   This information is not intended to replace advice given to you by your health care provider. Make sure you discuss any questions you have with your health care provider.   Document Released: 07/12/2004 Document Revised: 06/25/2014 Document Reviewed:  12/05/2012 Elsevier Interactive Patient Education 2016 [employer]  

## 2016-01-03 ENCOUNTER — Encounter (HOSPITAL_COMMUNITY): Payer: Medicare Other

## 2016-01-04 ENCOUNTER — Ambulatory Visit (HOSPITAL_COMMUNITY): Payer: Medicare Other

## 2016-01-04 ENCOUNTER — Encounter: Payer: Self-pay | Admitting: Pulmonary Disease

## 2016-01-04 DIAGNOSIS — R29898 Other symptoms and signs involving the musculoskeletal system: Secondary | ICD-10-CM | POA: Diagnosis not present

## 2016-01-04 DIAGNOSIS — M542 Cervicalgia: Secondary | ICD-10-CM

## 2016-01-04 NOTE — Therapy (Signed)
[place], [place], [address] Phone: [phone]   Fax:  [phone]  Occupational Therapy Treatment And reassessment Patient Details  Name: Cindy Schultz MRN: [id number] Date of Birth: Aug 25, 1923 Referring Provider: Arther Abbott, MD  Encounter Date: 01/04/2016      OT End of Session - 01/04/16 1501    Visit Number 7   Number of Visits 13   Authorization Type Medicare    Authorization Time Period before 10th visit   Authorization - Visit Number 7   Authorization - Number of Visits 10   OT Start Time 1350  reassessment/discharge   OT Stop Time 1430   OT Time Calculation (min) 40 min   Activity Tolerance Patient tolerated treatment well   Behavior During Therapy [facility] for tasks assessed/performed      Past Medical History  Diagnosis Date  . Macular degeneration   . Glaucoma   . Precancerous changes of the cervix     lesions  . Rectocele   . Diverticulitis   . Lumbar spine pain   . Pain in joints   . Hearing loss   . HTN (hypertension)     cholesterol  . Lymphedema   . DVT (deep venous thrombosis) ([place]) 07/14/10    LEA doppler   . SOB (shortness of breath) 04/04/2010    2D echo EF=>55%  . Carotid artery occlusion   . Cancer [facility]) March 2016    Skin Cancer :  Right Cheek     SCC  . Cancer [facility]) 2013    Skin Cancer  :  Anterior Neck   SCC    Past Surgical History  Procedure Laterality Date  . Salk    . Tracheotomy    . Fallen uterus    . Ovarian cyst surgery    . Bladder tac    . Hemmorrhoidectomy    . Met osteostomy    . Left total knee replacement  05/02/04    [name]  . Morton's nueroma    . Elevated metatarsal arch    . Rupture rt groin    . Appendix    . Rectocele repair    . Corneal erosion      cataracts   . Colonoscopy  09/20/2011    Procedure: COLONOSCOPY;  Surgeon: Beryle Beams, MD;  Location: [facility] ENDOSCOPY;  Service: Endoscopy;  Laterality: N/A;  . Abdominal  hysterectomy    . Skin cancer excision Right March 2016  2013    Cheek -  [facility]  and  Anterior Neck    There were no vitals filed for this visit.      Subjective Assessment - 01/04/16 1501    Subjective  S: I'm happy with where I am right now.   Currently in Pain? No/denies            [facility] OT Assessment - 01/04/16 1355    Assessment   Diagnosis Neck pain   Precautions   Precautions None   Palpation   Palpation comment Mod fascial restrictions in bilateral cervical region and min fascial restrictions in bilateral upper trapezius region.   AROM   Overall AROM Comments Assessed seated.   AROM Assessment Site Cervical   Cervical Flexion 70  Previous: 35   Cervical Extension 50  previous: 40   Cervical - Right Side Bend 30  previous: 12   Cervical - Left Side Bend 35  previous: 21   Cervical - Right Rotation 70  previous: 45   Cervical - Left Rotation 50  previous: 50                  OT Treatments/Exercises (OP) - 01/04/16 1420    Exercises   Exercises Neck   Neck Exercises: Seated   Cervical Rotation Both;10 reps   X to V 10 reps   Shoulder Rolls Backwards;10 reps   Neck Exercises: Supine   Cervical Rotation Both;10 reps  P/ROM and A/ROM   Lateral Flexion Both;10 reps  P/ROM   Manual Therapy   Manual Therapy Myofascial release   Manual therapy comments manual therapy completed prior to therapeutic exercise   Myofascial Release Myofascial release to bilateral upper trapezius and cervical regions to decrease pain and fascial restrictions and increase joint range of motion                  OT Short Term Goals - 01/04/16 1502    OT SHORT TERM GOAL #1   Title Patient will be educated and independent with HEP to increase functional use of neck during daily tasks.   Time 3   Period Weeks   Status Achieved   OT SHORT TERM GOAL #2   Title Patient will increase A/ROM of cervical region by 5 degrees as needed in order to look right and left  while driving with less difficulty.    Time 3   Period Weeks   Status Achieved   OT SHORT TERM GOAL #3   Title Patient will decrease fascial restrictions in cervical and trapezius region to mod amount to increase functional mobility.    Time 3   Period Weeks   Status Achieved           OT Long Term Goals - 01/04/16 1502    OT LONG TERM GOAL #1   Title Pt will return to highest level of independence while using her neck to look right and left when driving with reports of increased mobility and comfort leve.    Time 6   Period Weeks   Status Achieved   OT LONG TERM GOAL #2   Title Patient will increase cervical A/ROM by 10 degrees to increase ability to look right and left when driving.    Time 6   Period Weeks   Status Achieved   OT LONG TERM GOAL #3   Title Patient will decrease fascial restrictions to min amount in cervical region to increase functional mobility.    Time 6   Period Weeks   Status Not Met               Plan - 01/04/16 1503    Clinical Impression Statement A: Mini reassessment completed this date. patient has met all short term goals and 2/3 LTGs in 4 weeks. Patient reports that her right cervical rotation has improved about 40% and feels that her left rotation has improved very little and may be the same. Pt feels happy with her current functional level and requests to be discharged this date. Pt was encouraged to continue complete her HEP at home to maintain the ROM that she has achieved while in therapy.   Plan P: D/C from therapy.      Patient will benefit from skilled therapeutic intervention in order to improve the following deficits and impairments:  Decreased strength, Decreased range of motion, Increased fascial restricitons  Visit Diagnosis: Other symptoms and signs involving the musculoskeletal system  Cervicalgia      G-Codes -  01/04/16 1505    Functional Assessment Tool Used FOTO score: 61/100 (39% impaired)   Functional  Limitation Carrying, moving and handling objects   Carrying, Moving and Handling Objects Goal Status (H5456) At least 20 percent but less than 40 percent impaired, limited or restricted   Carrying, Moving and Handling Objects Discharge Status [phone]) At least 20 percent but less than 40 percent impaired, limited or restricted      Problem List Patient Active Problem List   Diagnosis Date Noted  . Acute blood loss anemia 09/10/2014  . GIB (gastrointestinal bleeding) 09/08/2014  . Occlusion and stenosis of carotid artery without mention of cerebral infarction 12/14/2013  . Osteoarthritis of left knee 04/23/2013  . Thumb pain 10/22/2012  . De Quervain's syndrome (tenosynovitis) 10/22/2012  . [facility] DJD(carpometacarpal degenerative joint disease), localized primary 10/22/2012  . CTS (carpal tunnel syndrome) 10/22/2012  . Trigger thumb of right hand 10/22/2012  . Pes anserinus bursitis of right knee 06/26/2012  . OA (osteoarthritis) of knee 06/26/2012  . Leg pain, left 06/26/2012  . Radicular pain of left lower extremity 06/26/2012  . Diverticulosis of colon with hemorrhage 09/22/2011  . Syncopal episodes 09/20/2011  . GI bleed 09/19/2011  . HTN (hypertension) 09/19/2011  . Anemia due to blood loss, acute 09/19/2011  . Abnormality of gait 01/03/2011  . Personal history of fall 12/27/2010  . DISLOCATION CLOSED SHOULDER NEC 04/25/2010  . ANSERINE BURSITIS, LEFT 02/13/2010  . KNEE PAIN 12/22/2009  . LEG PAIN, BILATERAL 12/22/2009  . TOTAL KNEE FOLLOW-UP 12/22/2009  . HIP PAIN 02/07/2009  . LOW BACK PAIN 02/07/2009  OCCUPATIONAL THERAPY DISCHARGE SUMMARY  Visits from Start of Care: 7  Current functional level related to goals / functional outcomes: See above   Remaining deficits: Pt continues to be limited for left cervical rotation    Education / Equipment: Patient was educated on cervical and scapular ROM exercises Plan: Patient agrees to discharge.  Patient goals were met.  Patient is being discharged due to being pleased with the current functional level.  ?????       Ailene Ravel, OTR/L,CBIS  [phone]  01/04/2016, 3:14 PM  [facility] [address] [place], [place], [address] Phone: [phone]   Fax:  [phone]  Name: Cindy Schultz MRN: [id number] Date of Birth: 07/06/1923

## 2016-01-05 ENCOUNTER — Encounter (HOSPITAL_COMMUNITY): Payer: Medicare Other

## 2016-01-09 DIAGNOSIS — H401493 Capsular glaucoma with pseudoexfoliation of lens, unspecified eye, severe stage: Secondary | ICD-10-CM | POA: Diagnosis not present

## 2016-01-11 DIAGNOSIS — E559 Vitamin D deficiency, unspecified: Secondary | ICD-10-CM | POA: Diagnosis not present

## 2016-01-11 DIAGNOSIS — N183 Chronic kidney disease, stage 3 (moderate): Secondary | ICD-10-CM | POA: Diagnosis not present

## 2016-01-11 DIAGNOSIS — D509 Iron deficiency anemia, unspecified: Secondary | ICD-10-CM | POA: Diagnosis not present

## 2016-01-11 DIAGNOSIS — I10 Essential (primary) hypertension: Secondary | ICD-10-CM | POA: Diagnosis not present

## 2016-01-11 DIAGNOSIS — R809 Proteinuria, unspecified: Secondary | ICD-10-CM | POA: Diagnosis not present

## 2016-01-11 DIAGNOSIS — Z79899 Other long term (current) drug therapy: Secondary | ICD-10-CM | POA: Diagnosis not present

## 2016-01-18 DIAGNOSIS — R809 Proteinuria, unspecified: Secondary | ICD-10-CM | POA: Diagnosis not present

## 2016-01-18 DIAGNOSIS — N184 Chronic kidney disease, stage 4 (severe): Secondary | ICD-10-CM | POA: Diagnosis not present

## 2016-02-08 DIAGNOSIS — H3530 Unspecified macular degeneration: Secondary | ICD-10-CM | POA: Diagnosis not present

## 2016-02-08 DIAGNOSIS — H401134 Primary open-angle glaucoma, bilateral, indeterminate stage: Secondary | ICD-10-CM | POA: Diagnosis not present

## 2016-02-08 DIAGNOSIS — H16223 Keratoconjunctivitis sicca, not specified as Sjogren's, bilateral: Secondary | ICD-10-CM | POA: Diagnosis not present

## 2016-02-17 ENCOUNTER — Other Ambulatory Visit: Payer: Self-pay

## 2016-02-27 DIAGNOSIS — Z23 Encounter for immunization: Secondary | ICD-10-CM | POA: Diagnosis not present

## 2016-02-27 DIAGNOSIS — M159 Polyosteoarthritis, unspecified: Secondary | ICD-10-CM | POA: Diagnosis not present

## 2016-02-27 DIAGNOSIS — G3184 Mild cognitive impairment, so stated: Secondary | ICD-10-CM | POA: Diagnosis not present

## 2016-02-27 DIAGNOSIS — I120 Hypertensive chronic kidney disease with stage 5 chronic kidney disease or end stage renal disease: Secondary | ICD-10-CM | POA: Diagnosis not present

## 2016-02-29 DIAGNOSIS — L570 Actinic keratosis: Secondary | ICD-10-CM | POA: Diagnosis not present

## 2016-02-29 DIAGNOSIS — X32XXXD Exposure to sunlight, subsequent encounter: Secondary | ICD-10-CM | POA: Diagnosis not present

## 2016-02-29 DIAGNOSIS — Z08 Encounter for follow-up examination after completed treatment for malignant neoplasm: Secondary | ICD-10-CM | POA: Diagnosis not present

## 2016-02-29 DIAGNOSIS — D225 Melanocytic nevi of trunk: Secondary | ICD-10-CM | POA: Diagnosis not present

## 2016-02-29 DIAGNOSIS — Z85828 Personal history of other malignant neoplasm of skin: Secondary | ICD-10-CM | POA: Diagnosis not present

## 2016-03-06 ENCOUNTER — Ambulatory Visit (INDEPENDENT_AMBULATORY_CARE_PROVIDER_SITE_OTHER): Payer: Medicare Other | Admitting: Orthopedic Surgery

## 2016-03-06 ENCOUNTER — Encounter: Payer: Self-pay | Admitting: Orthopedic Surgery

## 2016-03-06 VITALS — BP 123/67 | HR 70 | Ht 62.0 in | Wt 129.0 lb

## 2016-03-06 DIAGNOSIS — I6523 Occlusion and stenosis of bilateral carotid arteries: Secondary | ICD-10-CM

## 2016-03-06 DIAGNOSIS — M75101 Unspecified rotator cuff tear or rupture of right shoulder, not specified as traumatic: Secondary | ICD-10-CM | POA: Diagnosis not present

## 2016-03-06 DIAGNOSIS — M7551 Bursitis of right shoulder: Secondary | ICD-10-CM

## 2016-03-06 DIAGNOSIS — M25511 Pain in right shoulder: Secondary | ICD-10-CM | POA: Diagnosis not present

## 2016-03-06 NOTE — Progress Notes (Signed)
Patient ID: Cindy Schultz, female   DOB: Mar 21, 1924, 82 y.o.   MRN: [id number]  Chief Complaint  Patient presents with  . Follow-up    RIGHT SHOULDER AND NECK PAIN    HPI Cindy Schultz is a 82 y.o. female.   HPI Right shoulder impingement syndrome and cervical degenerative disc disease follow-up  The patient reports improvement in her right shoulder with just some mild to moderate residual pain and stiffness  Review of Systems Review of Systems Normal neuro  Denies fever  Examination BP 123/67   Pulse 70   Ht 5\' 2"  (1.575 m)   Wt 129 lb (58.5 kg)   BMI 23.59 kg/m   Gen. appearance the patient's appearance is normal with normal grooming and  hygiene The patient is oriented to person place and time Mood and affect are normal   Ortho Exam Gait is remarkable for shuffling but normal otherwise gait Inspection reveals mild tenderness over the right deltoid  ROM is 110 on the right 150 on the left  Stability tests are normal  Motor exam 5/5 manual muscle testing , no atrophy  Skin is normal (no rash or erythema)  She has a positive impingement sign  Medical decision-making Diagnosis, Data, Plan (risk)  Rotator cuff impingement syndrome right shoulder  Repeat injection   Procedure note the subacromial injection shoulder RIGHT  Verbal consent was obtained to inject the  RIGHT   Shoulder  Timeout was completed to confirm the injection site is a subacromial space of the  RIGHT  shoulder   Medication used Depo-Medrol 40 mg and lidocaine 1% 3 cc  Anesthesia was provided by ethyl chloride  The injection was performed in the RIGHT  posterior subacromial space. After pinning the skin with alcohol and anesthetized the skin with ethyl chloride the subacromial space was injected using a 20-gauge needle. There were no complications  Sterile dressing was applied.    Arther Abbott, MD 03/06/2016 2:58 PM

## 2016-03-08 ENCOUNTER — Ambulatory Visit: Payer: Medicare Other | Admitting: Orthopedic Surgery

## 2016-03-20 ENCOUNTER — Telehealth: Payer: Self-pay | Admitting: Orthopedic Surgery

## 2016-03-20 NOTE — Telephone Encounter (Signed)
Patient called to relay that her right shoulder, recently treated with injection, is not not any better.  Schedule appointment or other recommendation?

## 2016-03-20 NOTE — Telephone Encounter (Signed)
Injected 2 weeks ago, routing to Dr [name] to advise

## 2016-03-20 NOTE — Telephone Encounter (Signed)
Send for 6 weeks of physical therapy 3 times a week rotator cuff syndrome

## 2016-03-22 ENCOUNTER — Other Ambulatory Visit: Payer: Self-pay | Admitting: *Deleted

## 2016-03-22 DIAGNOSIS — M75101 Unspecified rotator cuff tear or rupture of right shoulder, not specified as traumatic: Secondary | ICD-10-CM

## 2016-03-22 NOTE — Telephone Encounter (Signed)
Therapy ordered, patient aware

## 2016-03-23 ENCOUNTER — Ambulatory Visit (HOSPITAL_COMMUNITY): Payer: Medicare Other | Attending: Orthopedic Surgery | Admitting: Specialist

## 2016-03-23 DIAGNOSIS — R29898 Other symptoms and signs involving the musculoskeletal system: Secondary | ICD-10-CM | POA: Diagnosis not present

## 2016-03-23 DIAGNOSIS — M25511 Pain in right shoulder: Secondary | ICD-10-CM | POA: Insufficient documentation

## 2016-03-23 DIAGNOSIS — G8929 Other chronic pain: Secondary | ICD-10-CM | POA: Insufficient documentation

## 2016-03-23 DIAGNOSIS — M25611 Stiffness of right shoulder, not elsewhere classified: Secondary | ICD-10-CM | POA: Diagnosis not present

## 2016-03-23 NOTE — Therapy (Signed)
[place], [place], [address] Phone: [phone]   Fax:  [phone]  Occupational Therapy Evaluation  Patient Details  Name: Cindy Schultz MRN: [id number] Date of Birth: 12-01-1923 Referring Provider: Dr. Arther Abbott  Encounter Date: 03/23/2016      OT End of Session - 03/23/16 1001    Visit Number 1   Number of Visits 16   Date for OT Re-Evaluation 05/22/16  mini reassess on 04/22/16   Authorization Type Medicare    Authorization Time Period before 10th visit   Authorization - Visit Number 1   Authorization - Number of Visits 10   OT Start Time [phone]   OT Stop Time 0945   OT Time Calculation (min) 40 min   Activity Tolerance Patient tolerated treatment well   Behavior During Therapy [facility] for tasks assessed/performed      Past Medical History:  Diagnosis Date  . Cancer [facility]) March 2016   Skin Cancer :  Right Cheek     SCC  . Cancer [facility]) 2013   Skin Cancer  :  Anterior Neck   SCC  . Carotid artery occlusion   . Diverticulitis   . DVT (deep venous thrombosis) ([place]) 07/14/10   LEA doppler   . Glaucoma   . Hearing loss   . HTN (hypertension)    cholesterol  . Lumbar spine pain   . Lymphedema   . Macular degeneration   . Pain in joints   . Precancerous changes of the cervix    lesions  . Rectocele   . SOB (shortness of breath) 04/04/2010   2D echo EF=>55%    Past Surgical History:  Procedure Laterality Date  . ABDOMINAL HYSTERECTOMY    . appendix    . bladder tac    . COLONOSCOPY  09/20/2011   Procedure: COLONOSCOPY;  Surgeon: Beryle Beams, MD;  Location: [facility] ENDOSCOPY;  Service: Endoscopy;  Laterality: N/A;  . corneal erosion     cataracts   . elevated metatarsal arch    . fallen uterus    . hemmorrhoidectomy    . left total knee replacement  05/02/04   [name]  . met osteostomy    . morton's nueroma    . OVARIAN CYST SURGERY    . RECTOCELE REPAIR    . rupture rt groin     . salk    . SKIN CANCER EXCISION Right March 2016  2013   Cheek -  [facility]  and  Anterior Neck  . tracheotomy      There were no vitals filed for this visit.      Subjective Assessment - 03/23/16 0914    Subjective  S:  My shoulder has hurt me for quite awhile.  Ever since I got the last injection it has bothered me more.    Pertinent History Patient is an 82 year old female iwth history of chronic right shoulder pain.  MRI detected a right rotator cuff tear.  She has been treated with cortisone injections in the past.  The last injection has not relieved her pain.  She has been referred to occupational therapy for evaluation and treatment.    Patient Stated Goals Get rid of pain when reaching overhead with right arm   Currently in Pain? Yes   Pain Score 7    Pain Location Shoulder   Pain Orientation Right;Lateral   Pain Descriptors / Indicators Aching   Pain Type Chronic pain  Pain Radiating Towards elbow   Pain Onset More than a month ago   Aggravating Factors  reaching behind head or back   Pain Relieving Factors rest   Effect of Pain on Daily Activities decreased use of dominant right arm           [facility] OT Assessment - 03/23/16 0001      Assessment   Diagnosis Right Rotator Cuff Syndrome   Referring Provider Dr. Arther Abbott   Onset Date --  chronic   Prior Therapy July of this year for neck pain     Precautions   Precautions None     Restrictions   Weight Bearing Restrictions No     Balance Screen   Has the patient fallen in the past 6 months No   Has the patient had a decrease in activity level because of a fear of falling?  No   Is the patient reluctant to leave their home because of a fear of falling?  No     Home  Environment   Family/patient expects to be discharged to: Private residence     Prior Function   Level of Independence Independent   Vocation Retired     ADL   ADL comments Patient has difficulty reaching behind head to comb or shampoo  hair, reach behind back to fasten her bra, difficulty reaching overehad if not using reacherm      Mobility   Mobility Status Independent     Written Expression   Dominant Hand Right     Vision - History   Baseline Vision Wears glasses all the time     Cognition   Overall Cognitive Status Within Functional Limits for tasks assessed     Observation/Other Assessments   Focus on Therapeutic Outcomes (FOTO)  42/100     Sensation   Light Touch Appears Intact     Coordination   Gross Motor Movements are Fluid and Coordinated Yes   Fine Motor Movements are Fluid and Coordinated Yes     Palpation   Palpation comment mod-max fascial restrictions noted in right shoulder and scapular region      AROM   Overall AROM Comments assessed in seated, external rotation and internal rotation with shoulder    AROM Assessment Site Shoulder   Right/Left Shoulder Right   Right Shoulder Flexion 113 Degrees   Right Shoulder ABduction 99 Degrees   Right Shoulder Internal Rotation 85 Degrees   Right Shoulder External Rotation 60 Degrees     PROM   Overall PROM Comments WFL in seated     Strength   Overall Strength Comments assessed in seated, External rotation and internal rotation with shoudler adducted    Strength Assessment Site Shoulder   Right/Left Shoulder Right   Right Shoulder Flexion 4/5   Right Shoulder ABduction 4/5   Right Shoulder Internal Rotation 4/5   Right Shoulder External Rotation 4/5                  OT Treatments/Exercises (OP) - 03/23/16 0001      Exercises   Exercises Shoulder     Shoulder Exercises: Stretch   Table Stretch - Flexion 5 reps   Table Stretch - Abduction 5 reps   Table Stretch - External Rotation 5 reps     Manual Therapy   Manual Therapy Myofascial release   Manual therapy comments manual therapy completed prior to therapeutic exercise   Myofascial Release myofascial release and manual stretching to right upper  arm, scapular, and  shoulder region to decrease pain and fascial restrictions and improve pain free mobility               OT Education - 03/23/16 1000    Education provided Yes   Education Details table slides for flexion, abduction, external rotation   Person(s) Educated Patient   Methods Explanation;Demonstration;Handout;Verbal cues   Comprehension Verbalized understanding;Returned demonstration          OT Short Term Goals - 03/23/16 1005      OT SHORT TERM GOAL #1   Title Patient will be educated and independent with HEP to increase functional use of right arm during daily tasks.   Time 4   Period Weeks   Status New     OT SHORT TERM GOAL #2   Title Patient will increase right shoulder A/ROM by 10 degrees for improved ability to comb her hair with less pain.    Time 4   Period Weeks   Status New     OT SHORT TERM GOAL #3   Title Patient will decrease pain in right shoulder to 4/10 with functional activities.    Time 4   Period Weeks   Status New           OT Long Term Goals - 03/23/16 1006      OT LONG TERM GOAL #1   Title Patient will use her right arm as dominant with all B/IADLs.    Time 8   Period Weeks   Status New     OT LONG TERM GOAL #2   Title Patient will demonstrate WFL A/ROM in her right shoulder in order to shampoo her hair without pain.    Time 8   Period Weeks   Status New     OT LONG TERM GOAL #3   Title Patient will improve right shoulder strength to 4+/5 for improved abilty to lift bags of groceries.    Time 8   Period Weeks   Status New     OT LONG TERM GOAL #4   Title Patient will decrease pain in right shoulder to 2/10 or less during functional activities.    Time 8   Period Weeks   Status New     OT LONG TERM GOAL #5   Title Patient will decrease fascial restrictions in right shoulder to minimal in order to have improved pain free mobility in right shoulder for ADL completion.    Time 8   Period Weeks   Status New                Plan - 03/23/16 1001    Clinical Impression Statement A:  patient is an 82 year old female with chronic right shoulder pain and know right rotator cuff tear.  Patient has had increased pain s/p last cortisone injection.  Patient lives alone and is right hand dominant. Right arm A/ROM limitations are causing deficits with B/ADLs such as combing and shampooing hair.     Rehab Potential Good   OT Frequency 2x / week   OT Duration 8 weeks   OT Treatment/Interventions Self-care/ADL training;Ultrasound;DME and/or AE instruction;Passive range of motion;Patient/family education;Cryotherapy;Electrical Stimulation;Moist Heat;Therapeutic activities;Therapeutic exercises;Manual Therapy   Plan P:  Skilled OT intervention to improve A/ROM and strength in right arm while decreasing fascial restrictions and pain in order to use right arm as dominant with all daily activities.  Next session, review plan of care and HEP.  Begin AA/ROM and progress to  A/ROM as tolerated, focus on funtional reaching activities.    Consulted and Agree with Plan of Care Patient      Patient will benefit from skilled therapeutic intervention in order to improve the following deficits and impairments:  Decreased range of motion, Decreased strength, Increased muscle spasms, Increased fascial restricitons, Impaired UE functional use, Pain  Visit Diagnosis: Other symptoms and signs involving the musculoskeletal system - Plan: Ot plan of care cert/re-cert  Stiffness of right shoulder, not elsewhere classified - Plan: Ot plan of care cert/re-cert  Chronic right shoulder pain - Plan: Ot plan of care cert/re-cert      G-Codes - 62/37/62 1011    Self Care Goal Status (G3151) At least 20 percent but less than 40 percent impaired, limited or restricted      Problem List Patient Active Problem List   Diagnosis Date Noted  . Acute blood loss anemia 09/10/2014  . GIB (gastrointestinal bleeding) 09/08/2014  . Occlusion and stenosis  of carotid artery without mention of cerebral infarction 12/14/2013  . Osteoarthritis of left knee 04/23/2013  . Thumb pain 10/22/2012  . De Quervain's syndrome (tenosynovitis) 10/22/2012  . [facility] DJD(carpometacarpal degenerative joint disease), localized primary 10/22/2012  . CTS (carpal tunnel syndrome) 10/22/2012  . Trigger thumb of right hand 10/22/2012  . Pes anserinus bursitis of right knee 06/26/2012  . OA (osteoarthritis) of knee 06/26/2012  . Leg pain, left 06/26/2012  . Radicular pain of left lower extremity 06/26/2012  . Diverticulosis of colon with hemorrhage 09/22/2011  . Syncopal episodes 09/20/2011  . GI bleed 09/19/2011  . HTN (hypertension) 09/19/2011  . Anemia due to blood loss, acute 09/19/2011  . Abnormality of gait 01/03/2011  . Personal history of fall 12/27/2010  . DISLOCATION CLOSED SHOULDER NEC 04/25/2010  . ANSERINE BURSITIS, LEFT 02/13/2010  . KNEE PAIN 12/22/2009  . LEG PAIN, BILATERAL 12/22/2009  . TOTAL KNEE FOLLOW-UP 12/22/2009  . HIP PAIN 02/07/2009  . LOW BACK PAIN 02/07/2009    Vangie Bicker, [place], OTR/L [phone]  03/23/2016, 10:13 AM  [facility] [address] [place], [place], [address] Phone: [phone]   Fax:  [phone]  Name: Cindy Schultz MRN: [id number] Date of Birth: 1923-11-24

## 2016-03-23 NOTE — Patient Instructions (Signed)
SHOULDER: Flexion On Table   Place hands on towel on table, elbows straight. Slide arms across table, leaning forward as able. Return to upright position.  Continue for 1-3 minutes.  Complete 2-3 times per day.    Abduction (Passive)   With right arm on table on towel, slide arm across table, leaning towards table, as able.  Return to upright position.  Repeat for 1-3 minutes.  Complete 2-3 times per day.  Copyright  VHI. All rights reserved.     Internal Rotation (Assistive)   Seated with elbow bent at right angle and held against side, slide arm on table surface in an inward arc.  Complete 1-3 minutes 2-3 times per day.  Activity: Use this motion to brush crumbs off the table.  Copyright  VHI. All rights reserved.

## 2016-03-28 ENCOUNTER — Ambulatory Visit (HOSPITAL_COMMUNITY): Payer: Medicare Other

## 2016-03-28 ENCOUNTER — Encounter (HOSPITAL_COMMUNITY): Payer: Self-pay

## 2016-03-28 DIAGNOSIS — M25611 Stiffness of right shoulder, not elsewhere classified: Secondary | ICD-10-CM

## 2016-03-28 DIAGNOSIS — G8929 Other chronic pain: Secondary | ICD-10-CM

## 2016-03-28 DIAGNOSIS — M25511 Pain in right shoulder: Secondary | ICD-10-CM

## 2016-03-28 DIAGNOSIS — R29898 Other symptoms and signs involving the musculoskeletal system: Secondary | ICD-10-CM

## 2016-03-28 NOTE — Therapy (Signed)
[place], [place], [address] Phone: [phone]   Fax:  [phone]  Occupational Therapy Treatment  Patient Details  Name: Cindy Schultz MRN: [id number] Date of Birth: December 06, 1923 Referring Provider: Dr. Arther Abbott  Encounter Date: 03/28/2016      OT End of Session - 03/28/16 1623    Visit Number 2   Number of Visits 16   Date for OT Re-Evaluation 05/22/16  mini reassess on 04/22/16   Authorization Type Medicare    Authorization Time Period before 10th visit   Authorization - Visit Number 2   Authorization - Number of Visits 10   OT Start Time 1435   OT Stop Time 1515   OT Time Calculation (min) 40 min   Activity Tolerance Patient tolerated treatment well   Behavior During Therapy [facility] for tasks assessed/performed      Past Medical History:  Diagnosis Date  . Cancer [facility]) March 2016   Skin Cancer :  Right Cheek     SCC  . Cancer [facility]) 2013   Skin Cancer  :  Anterior Neck   SCC  . Carotid artery occlusion   . Diverticulitis   . DVT (deep venous thrombosis) ([place]) 07/14/10   LEA doppler   . Glaucoma   . Hearing loss   . HTN (hypertension)    cholesterol  . Lumbar spine pain   . Lymphedema   . Macular degeneration   . Pain in joints   . Precancerous changes of the cervix    lesions  . Rectocele   . SOB (shortness of breath) 04/04/2010   2D echo EF=>55%    Past Surgical History:  Procedure Laterality Date  . ABDOMINAL HYSTERECTOMY    . appendix    . bladder tac    . COLONOSCOPY  09/20/2011   Procedure: COLONOSCOPY;  Surgeon: Beryle Beams, MD;  Location: [facility] ENDOSCOPY;  Service: Endoscopy;  Laterality: N/A;  . corneal erosion     cataracts   . elevated metatarsal arch    . fallen uterus    . hemmorrhoidectomy    . left total knee replacement  05/02/04   [name]  . met osteostomy    . morton's nueroma    . OVARIAN CYST SURGERY    . RECTOCELE REPAIR    . rupture rt groin     . salk    . SKIN CANCER EXCISION Right March 2016  2013   Cheek -  [facility]  and  Anterior Neck  . tracheotomy      There were no vitals filed for this visit.      Subjective Assessment - 03/28/16 1456    Subjective  S: It doesn't hurt unless I'm using it.   Currently in Pain? No/denies            [facility] OT Assessment - 03/28/16 1456      Assessment   Diagnosis Right Rotator Cuff Syndrome     Precautions   Precautions None                  OT Treatments/Exercises (OP) - 03/28/16 1456      Exercises   Exercises Shoulder     Shoulder Exercises: Supine   Protraction PROM;AAROM;10 reps   Horizontal ABduction PROM;AAROM;10 reps   External Rotation PROM;AAROM;10 reps   Internal Rotation PROM;AAROM;10 reps   Flexion PROM;AAROM;10 reps   ABduction PROM;10 reps     Shoulder Exercises: Seated  Elevation AROM;10 reps   Extension AROM;10 reps   Row AROM;10 reps   Protraction AAROM;10 reps   Horizontal ABduction AAROM;10 reps   External Rotation AAROM;10 reps   Internal Rotation AAROM;10 reps   Flexion AAROM;10 reps     Manual Therapy   Manual Therapy Myofascial release   Manual therapy comments manual therapy completed prior to therapeutic exercise   Myofascial Release myofascial release and manual stretching to right upper arm, scapular, and shoulder region to decrease pain and fascial restrictions and improve pain free mobility                OT Education - 03/28/16 1623    Education provided Yes   Education Details Pt was given print out of OT evaluation. Reviewed goals and plan of care.    Person(s) Educated Patient   Methods Explanation;Handout   Comprehension Verbalized understanding          OT Short Term Goals - 03/28/16 1456      OT SHORT TERM GOAL #1   Title Patient will be educated and independent with HEP to increase functional use of right arm during daily tasks.   Time 4   Period Weeks   Status On-going     OT SHORT TERM  GOAL #2   Title Patient will increase right shoulder A/ROM by 10 degrees for improved ability to comb her hair with less pain.    Time 4   Period Weeks   Status On-going     OT SHORT TERM GOAL #3   Title Patient will decrease pain in right shoulder to 4/10 with functional activities.    Time 4   Period Weeks   Status On-going           OT Long Term Goals - 03/28/16 1456      OT LONG TERM GOAL #1   Title Patient will use her right arm as dominant with all B/IADLs.    Time 8   Period Weeks   Status On-going     OT LONG TERM GOAL #2   Title Patient will demonstrate WFL A/ROM in her right shoulder in order to shampoo her hair without pain.    Time 8   Period Weeks   Status On-going     OT LONG TERM GOAL #3   Title Patient will improve right shoulder strength to 4+/5 for improved abilty to lift bags of groceries.    Time 8   Period Weeks   Status On-going     OT LONG TERM GOAL #4   Title Patient will decrease pain in right shoulder to 2/10 or less during functional activities.    Time 8   Period Weeks   Status On-going     OT LONG TERM GOAL #5   Title Patient will decrease fascial restrictions in right shoulder to minimal in order to have improved pain free mobility in right shoulder for ADL completion.    Time 8   Period Weeks   Status On-going               Plan - 03/28/16 1624    Clinical Impression Statement A; Initiated myofascial release, manual stretching, AA/ROM supine and seated, and seated A/ROM for the scapula. VC for form and technique. patient completed all exercises with minor pain noted.    Plan P: Complete missed exercises for AA/ROM supine and seated. Add wall wash.      Patient will benefit from skilled therapeutic intervention in  order to improve the following deficits and impairments:  Decreased range of motion, Decreased strength, Increased muscle spasms, Increased fascial restricitons, Impaired UE functional use, Pain  Visit  Diagnosis: Other symptoms and signs involving the musculoskeletal system  Stiffness of right shoulder, not elsewhere classified  Chronic right shoulder pain    Problem List Patient Active Problem List   Diagnosis Date Noted  . Acute blood loss anemia 09/10/2014  . GIB (gastrointestinal bleeding) 09/08/2014  . Occlusion and stenosis of carotid artery without mention of cerebral infarction 12/14/2013  . Osteoarthritis of left knee 04/23/2013  . Thumb pain 10/22/2012  . De Quervain's syndrome (tenosynovitis) 10/22/2012  . [facility] DJD(carpometacarpal degenerative joint disease), localized primary 10/22/2012  . CTS (carpal tunnel syndrome) 10/22/2012  . Trigger thumb of right hand 10/22/2012  . Pes anserinus bursitis of right knee 06/26/2012  . OA (osteoarthritis) of knee 06/26/2012  . Leg pain, left 06/26/2012  . Radicular pain of left lower extremity 06/26/2012  . Diverticulosis of colon with hemorrhage 09/22/2011  . Syncopal episodes 09/20/2011  . GI bleed 09/19/2011  . HTN (hypertension) 09/19/2011  . Anemia due to blood loss, acute 09/19/2011  . Abnormality of gait 01/03/2011  . Personal history of fall 12/27/2010  . DISLOCATION CLOSED SHOULDER NEC 04/25/2010  . ANSERINE BURSITIS, LEFT 02/13/2010  . KNEE PAIN 12/22/2009  . LEG PAIN, BILATERAL 12/22/2009  . TOTAL KNEE FOLLOW-UP 12/22/2009  . HIP PAIN 02/07/2009  . LOW BACK PAIN 02/07/2009    Ailene Ravel, OTR/L,CBIS  [phone]  03/28/2016, 4:26 PM  [place] [address] [place], [place], [address] Phone: [phone]   Fax:  [phone]  Name: Cindy Schultz MRN: [id number] Date of Birth: 08/12/1923

## 2016-03-30 ENCOUNTER — Ambulatory Visit (HOSPITAL_COMMUNITY): Payer: Medicare Other

## 2016-03-30 DIAGNOSIS — M25511 Pain in right shoulder: Secondary | ICD-10-CM

## 2016-03-30 DIAGNOSIS — R29898 Other symptoms and signs involving the musculoskeletal system: Secondary | ICD-10-CM | POA: Diagnosis not present

## 2016-03-30 DIAGNOSIS — G8929 Other chronic pain: Secondary | ICD-10-CM | POA: Diagnosis not present

## 2016-03-30 DIAGNOSIS — M25611 Stiffness of right shoulder, not elsewhere classified: Secondary | ICD-10-CM

## 2016-03-30 NOTE — Therapy (Signed)
[place], [place], [address] Phone: [phone]   Fax:  [phone]  Occupational Therapy Treatment  Patient Details  Name: Cindy Schultz MRN: [id number] Date of Birth: Jun 13, 1924 Referring Provider: Dr. Arther Abbott  Encounter Date: 03/30/2016      OT End of Session - 03/30/16 1014    Visit Number 3   Number of Visits 16   Date for OT Re-Evaluation 05/22/16  mini reassess on 04/22/16   Authorization Type Medicare    Authorization Time Period before 10th visit   Authorization - Visit Number 3   Authorization - Number of Visits 10   OT Start Time [phone]   OT Stop Time 0945   OT Time Calculation (min) 42 min   Activity Tolerance Patient tolerated treatment well   Behavior During Therapy [facility] for tasks assessed/performed      Past Medical History:  Diagnosis Date  . Cancer [facility]) March 2016   Skin Cancer :  Right Cheek     SCC  . Cancer [facility]) 2013   Skin Cancer  :  Anterior Neck   SCC  . Carotid artery occlusion   . Diverticulitis   . DVT (deep venous thrombosis) ([place]) 07/14/10   LEA doppler   . Glaucoma   . Hearing loss   . HTN (hypertension)    cholesterol  . Lumbar spine pain   . Lymphedema   . Macular degeneration   . Pain in joints   . Precancerous changes of the cervix    lesions  . Rectocele   . SOB (shortness of breath) 04/04/2010   2D echo EF=>55%    Past Surgical History:  Procedure Laterality Date  . ABDOMINAL HYSTERECTOMY    . appendix    . bladder tac    . COLONOSCOPY  09/20/2011   Procedure: COLONOSCOPY;  Surgeon: Beryle Beams, MD;  Location: [facility] ENDOSCOPY;  Service: Endoscopy;  Laterality: N/A;  . corneal erosion     cataracts   . elevated metatarsal arch    . fallen uterus    . hemmorrhoidectomy    . left total knee replacement  05/02/04   [name]  . met osteostomy    . morton's nueroma    . OVARIAN CYST SURGERY    . RECTOCELE REPAIR    . rupture rt groin     . salk    . SKIN CANCER EXCISION Right March 2016  2013   Cheek -  [facility]  and  Anterior Neck  . tracheotomy      There were no vitals filed for this visit.          [facility] OT Assessment - 03/30/16 0924      Assessment   Diagnosis Right Rotator Cuff Syndrome     Precautions   Precautions None                  OT Treatments/Exercises (OP) - 03/30/16 0924      Exercises   Exercises Shoulder     Shoulder Exercises: Supine   Protraction PROM;AAROM;10 reps   Horizontal ABduction PROM;AAROM;10 reps   External Rotation PROM;AAROM;10 reps   Internal Rotation PROM;AAROM;10 reps   Flexion PROM;AAROM;10 reps   ABduction PROM;10 reps     Shoulder Exercises: Seated   Protraction AAROM;10 reps   Horizontal ABduction AAROM;10 reps   External Rotation AAROM;10 reps   Internal Rotation AAROM;10 reps   Flexion AAROM;10 reps  Shoulder Exercises: ROM/Strengthening   Wall Wash 1'     Manual Therapy   Manual Therapy Myofascial release   Manual therapy comments manual therapy completed prior to therapeutic exercise   Myofascial Release myofascial release and manual stretching to right upper arm, scapular, and shoulder region to decrease pain and fascial restrictions and improve pain free mobility                  OT Short Term Goals - 03/28/16 1456      OT SHORT TERM GOAL #1   Title Patient will be educated and independent with HEP to increase functional use of right arm during daily tasks.   Time 4   Period Weeks   Status On-going     OT SHORT TERM GOAL #2   Title Patient will increase right shoulder A/ROM by 10 degrees for improved ability to comb her hair with less pain.    Time 4   Period Weeks   Status On-going     OT SHORT TERM GOAL #3   Title Patient will decrease pain in right shoulder to 4/10 with functional activities.    Time 4   Period Weeks   Status On-going           OT Long Term Goals - 03/28/16 1456      OT LONG TERM GOAL  #1   Title Patient will use her right arm as dominant with all B/IADLs.    Time 8   Period Weeks   Status On-going     OT LONG TERM GOAL #2   Title Patient will demonstrate WFL A/ROM in her right shoulder in order to shampoo her hair without pain.    Time 8   Period Weeks   Status On-going     OT LONG TERM GOAL #3   Title Patient will improve right shoulder strength to 4+/5 for improved abilty to lift bags of groceries.    Time 8   Period Weeks   Status On-going     OT LONG TERM GOAL #4   Title Patient will decrease pain in right shoulder to 2/10 or less during functional activities.    Time 8   Period Weeks   Status On-going     OT LONG TERM GOAL #5   Title Patient will decrease fascial restrictions in right shoulder to minimal in order to have improved pain free mobility in right shoulder for ADL completion.    Time 8   Period Weeks   Status On-going               Plan - 03/30/16 1015    Clinical Impression Statement A: Added wall wash with VC for form and technique. Cueing also needed for for AA/ROM exercises supine seated. Pt reports increased soreness when completing supine exercises.    Plan P: Add pulleys      Patient will benefit from skilled therapeutic intervention in order to improve the following deficits and impairments:  Decreased range of motion, Decreased strength, Increased muscle spasms, Increased fascial restricitons, Impaired UE functional use, Pain  Visit Diagnosis: Other symptoms and signs involving the musculoskeletal system  Stiffness of right shoulder, not elsewhere classified  Chronic right shoulder pain    Problem List Patient Active Problem List   Diagnosis Date Noted  . Acute blood loss anemia 09/10/2014  . GIB (gastrointestinal bleeding) 09/08/2014  . Occlusion and stenosis of carotid artery without mention of cerebral infarction 12/14/2013  . Osteoarthritis of  left knee 04/23/2013  . Thumb pain 10/22/2012  . De Quervain's  syndrome (tenosynovitis) 10/22/2012  . [facility] DJD(carpometacarpal degenerative joint disease), localized primary 10/22/2012  . CTS (carpal tunnel syndrome) 10/22/2012  . Trigger thumb of right hand 10/22/2012  . Pes anserinus bursitis of right knee 06/26/2012  . OA (osteoarthritis) of knee 06/26/2012  . Leg pain, left 06/26/2012  . Radicular pain of left lower extremity 06/26/2012  . Diverticulosis of colon with hemorrhage 09/22/2011  . Syncopal episodes 09/20/2011  . GI bleed 09/19/2011  . HTN (hypertension) 09/19/2011  . Anemia due to blood loss, acute 09/19/2011  . Abnormality of gait 01/03/2011  . Personal history of fall 12/27/2010  . DISLOCATION CLOSED SHOULDER NEC 04/25/2010  . ANSERINE BURSITIS, LEFT 02/13/2010  . KNEE PAIN 12/22/2009  . LEG PAIN, BILATERAL 12/22/2009  . TOTAL KNEE FOLLOW-UP 12/22/2009  . HIP PAIN 02/07/2009  . LOW BACK PAIN 02/07/2009   Ailene Ravel, OTR/L,CBIS  [phone]  03/30/2016, 10:17 AM  [place] [address] [place], [place], [address] Phone: [phone]   Fax:  [phone]  Name: Cindy Schultz MRN: [id number] Date of Birth: Aug 21, 1923

## 2016-04-03 ENCOUNTER — Ambulatory Visit (HOSPITAL_COMMUNITY): Payer: Medicare Other | Admitting: Occupational Therapy

## 2016-04-03 ENCOUNTER — Encounter (HOSPITAL_COMMUNITY): Payer: Self-pay | Admitting: Occupational Therapy

## 2016-04-03 DIAGNOSIS — M25611 Stiffness of right shoulder, not elsewhere classified: Secondary | ICD-10-CM | POA: Diagnosis not present

## 2016-04-03 DIAGNOSIS — M25511 Pain in right shoulder: Secondary | ICD-10-CM

## 2016-04-03 DIAGNOSIS — G8929 Other chronic pain: Secondary | ICD-10-CM | POA: Diagnosis not present

## 2016-04-03 DIAGNOSIS — R29898 Other symptoms and signs involving the musculoskeletal system: Secondary | ICD-10-CM

## 2016-04-03 NOTE — Therapy (Signed)
[place], [place], [address] Phone: [phone]   Fax:  [phone]  Occupational Therapy Treatment  Patient Details  Name: Cindy Schultz MRN: [id number] Date of Birth: 1924/05/28 Referring Provider: Dr. Arther Abbott  Encounter Date: 04/03/2016      OT End of Session - 04/03/16 1438    Visit Number 4   Number of Visits 16   Date for OT Re-Evaluation 05/22/16  mini reassess on 04/22/16   Authorization Type Medicare    Authorization Time Period before 10th visit   Authorization - Visit Number 4   Authorization - Number of Visits 10   OT Start Time 1300   OT Stop Time 1344   OT Time Calculation (min) 44 min   Activity Tolerance Patient tolerated treatment well   Behavior During Therapy [facility] for tasks assessed/performed      Past Medical History:  Diagnosis Date  . Cancer [facility]) March 2016   Skin Cancer :  Right Cheek     SCC  . Cancer [facility]) 2013   Skin Cancer  :  Anterior Neck   SCC  . Carotid artery occlusion   . Diverticulitis   . DVT (deep venous thrombosis) ([place]) 07/14/10   LEA doppler   . Glaucoma   . Hearing loss   . HTN (hypertension)    cholesterol  . Lumbar spine pain   . Lymphedema   . Macular degeneration   . Pain in joints   . Precancerous changes of the cervix    lesions  . Rectocele   . SOB (shortness of breath) 04/04/2010   2D echo EF=>55%    Past Surgical History:  Procedure Laterality Date  . ABDOMINAL HYSTERECTOMY    . appendix    . bladder tac    . COLONOSCOPY  09/20/2011   Procedure: COLONOSCOPY;  Surgeon: Beryle Beams, MD;  Location: [facility] ENDOSCOPY;  Service: Endoscopy;  Laterality: N/A;  . corneal erosion     cataracts   . elevated metatarsal arch    . fallen uterus    . hemmorrhoidectomy    . left total knee replacement  05/02/04   [name]  . met osteostomy    . morton's nueroma    . OVARIAN CYST SURGERY    . RECTOCELE REPAIR    . rupture rt groin     . salk    . SKIN CANCER EXCISION Right March 2016  2013   Cheek -  [facility]  and  Anterior Neck  . tracheotomy      There were no vitals filed for this visit.      Subjective Assessment - 04/03/16 1301    Subjective  S: It's sore if I use it but it doesn't hurt if I don't use it.    Currently in Pain? No/denies            [facility] OT Assessment - 04/03/16 1301      Assessment   Diagnosis Right Rotator Cuff Syndrome     Precautions   Precautions None                  OT Treatments/Exercises (OP) - 04/03/16 1305      Exercises   Exercises Shoulder     Shoulder Exercises: Supine   Protraction PROM;AAROM;10 reps   Horizontal ABduction PROM;AAROM;10 reps   External Rotation PROM;AAROM;10 reps   Internal Rotation PROM;AAROM;10 reps   Flexion PROM;AAROM;10 reps   ABduction PROM;10  reps     Shoulder Exercises: Seated   Protraction AAROM;10 reps   Horizontal ABduction AAROM;10 reps   External Rotation AAROM;10 reps   Internal Rotation AAROM;10 reps   Flexion AAROM;10 reps   Abduction AAROM;10 reps     Shoulder Exercises: Pulleys   Flexion 1 minute     Shoulder Exercises: ROM/Strengthening   Wall Wash 1'   Proximal Shoulder Strengthening, Seated 10X each, 1 rest break     Manual Therapy   Manual Therapy Myofascial release   Manual therapy comments manual therapy completed prior to therapeutic exercise   Myofascial Release myofascial release and manual stretching to right upper arm, scapular, and shoulder region to decrease pain and fascial restrictions and improve pain free mobility                  OT Short Term Goals - 03/28/16 1456      OT SHORT TERM GOAL #1   Title Patient will be educated and independent with HEP to increase functional use of right arm during daily tasks.   Time 4   Period Weeks   Status On-going     OT SHORT TERM GOAL #2   Title Patient will increase right shoulder A/ROM by 10 degrees for improved ability to comb her  hair with less pain.    Time 4   Period Weeks   Status On-going     OT SHORT TERM GOAL #3   Title Patient will decrease pain in right shoulder to 4/10 with functional activities.    Time 4   Period Weeks   Status On-going           OT Long Term Goals - 03/28/16 1456      OT LONG TERM GOAL #1   Title Patient will use her right arm as dominant with all B/IADLs.    Time 8   Period Weeks   Status On-going     OT LONG TERM GOAL #2   Title Patient will demonstrate WFL A/ROM in her right shoulder in order to shampoo her hair without pain.    Time 8   Period Weeks   Status On-going     OT LONG TERM GOAL #3   Title Patient will improve right shoulder strength to 4+/5 for improved abilty to lift bags of groceries.    Time 8   Period Weeks   Status On-going     OT LONG TERM GOAL #4   Title Patient will decrease pain in right shoulder to 2/10 or less during functional activities.    Time 8   Period Weeks   Status On-going     OT LONG TERM GOAL #5   Title Patient will decrease fascial restrictions in right shoulder to minimal in order to have improved pain free mobility in right shoulder for ADL completion.    Time 8   Period Weeks   Status On-going               Plan - 04/03/16 1438    Clinical Impression Statement A: Added proximal shoulder strengthening seated, pulley's, and AA/ROM abduction while seated. Pt reports continued soreness with use of RUE during daily tasks, also reports she completes exercises if she can do so without pain. Educated pt on importance of stretching and completing exercises on HEP, educated on discomfort and soreness expected with exercises.    Plan P: Add pulleys abduction, attempt scapular theraband if pt able to tolerate   Consulted and  Agree with Plan of Care Patient      Patient will benefit from skilled therapeutic intervention in order to improve the following deficits and impairments:  Decreased range of motion, Decreased  strength, Increased muscle spasms, Increased fascial restricitons, Impaired UE functional use, Pain  Visit Diagnosis: Other symptoms and signs involving the musculoskeletal system  Stiffness of right shoulder, not elsewhere classified  Chronic right shoulder pain    Problem List Patient Active Problem List   Diagnosis Date Noted  . Acute blood loss anemia 09/10/2014  . GIB (gastrointestinal bleeding) 09/08/2014  . Occlusion and stenosis of carotid artery without mention of cerebral infarction 12/14/2013  . Osteoarthritis of left knee 04/23/2013  . Thumb pain 10/22/2012  . De Quervain's syndrome (tenosynovitis) 10/22/2012  . [facility] DJD(carpometacarpal degenerative joint disease), localized primary 10/22/2012  . CTS (carpal tunnel syndrome) 10/22/2012  . Trigger thumb of right hand 10/22/2012  . Pes anserinus bursitis of right knee 06/26/2012  . OA (osteoarthritis) of knee 06/26/2012  . Leg pain, left 06/26/2012  . Radicular pain of left lower extremity 06/26/2012  . Diverticulosis of colon with hemorrhage 09/22/2011  . Syncopal episodes 09/20/2011  . GI bleed 09/19/2011  . HTN (hypertension) 09/19/2011  . Anemia due to blood loss, acute 09/19/2011  . Abnormality of gait 01/03/2011  . Personal history of fall 12/27/2010  . DISLOCATION CLOSED SHOULDER NEC 04/25/2010  . ANSERINE BURSITIS, LEFT 02/13/2010  . KNEE PAIN 12/22/2009  . LEG PAIN, BILATERAL 12/22/2009  . TOTAL KNEE FOLLOW-UP 12/22/2009  . HIP PAIN 02/07/2009  . LOW BACK PAIN 02/07/2009   Guadelupe Sabin, OTR/L  [phone] 04/03/2016, 2:41 PM  [place] [address] [place], [place], [address] Phone: [phone]   Fax:  [phone]  Name: ZONNIQUE NORKUS MRN: [id number] Date of Birth: 07/02/1923

## 2016-04-05 ENCOUNTER — Ambulatory Visit (HOSPITAL_COMMUNITY): Payer: Medicare Other | Admitting: Occupational Therapy

## 2016-04-05 ENCOUNTER — Encounter (HOSPITAL_COMMUNITY): Payer: Self-pay | Admitting: Occupational Therapy

## 2016-04-05 DIAGNOSIS — M25511 Pain in right shoulder: Secondary | ICD-10-CM

## 2016-04-05 DIAGNOSIS — G8929 Other chronic pain: Secondary | ICD-10-CM

## 2016-04-05 DIAGNOSIS — R29898 Other symptoms and signs involving the musculoskeletal system: Secondary | ICD-10-CM | POA: Diagnosis not present

## 2016-04-05 DIAGNOSIS — M25611 Stiffness of right shoulder, not elsewhere classified: Secondary | ICD-10-CM

## 2016-04-05 NOTE — Therapy (Signed)
[place], [place], [address] Phone: [phone]   Fax:  [phone]  Occupational Therapy Treatment  Patient Details  Name: Cindy Schultz MRN: [id number] Date of Birth: 04/12/1924 Referring Provider: Dr. Arther Abbott  Encounter Date: 04/05/2016      OT End of Session - 04/05/16 1346    Visit Number 5   Number of Visits 16   Date for OT Re-Evaluation 05/22/16  mini reassess on 04/22/16   Authorization Type Medicare    Authorization Time Period before 10th visit   Authorization - Visit Number 5   Authorization - Number of Visits 10   OT Start Time 1300   OT Stop Time 1345   OT Time Calculation (min) 45 min   Activity Tolerance Patient tolerated treatment well   Behavior During Therapy [facility] for tasks assessed/performed      Past Medical History:  Diagnosis Date  . Cancer [facility]) March 2016   Skin Cancer :  Right Cheek     SCC  . Cancer [facility]) 2013   Skin Cancer  :  Anterior Neck   SCC  . Carotid artery occlusion   . Diverticulitis   . DVT (deep venous thrombosis) ([place]) 07/14/10   LEA doppler   . Glaucoma   . Hearing loss   . HTN (hypertension)    cholesterol  . Lumbar spine pain   . Lymphedema   . Macular degeneration   . Pain in joints   . Precancerous changes of the cervix    lesions  . Rectocele   . SOB (shortness of breath) 04/04/2010   2D echo EF=>55%    Past Surgical History:  Procedure Laterality Date  . ABDOMINAL HYSTERECTOMY    . appendix    . bladder tac    . COLONOSCOPY  09/20/2011   Procedure: COLONOSCOPY;  Surgeon: Beryle Beams, MD;  Location: [facility] ENDOSCOPY;  Service: Endoscopy;  Laterality: N/A;  . corneal erosion     cataracts   . elevated metatarsal arch    . fallen uterus    . hemmorrhoidectomy    . left total knee replacement  05/02/04   [name]  . met osteostomy    . morton's nueroma    . OVARIAN CYST SURGERY    . RECTOCELE REPAIR    . rupture rt groin     . salk    . SKIN CANCER EXCISION Right March 2016  2013   Cheek -  [facility]  and  Anterior Neck  . tracheotomy      There were no vitals filed for this visit.      Subjective Assessment - 04/05/16 1303    Subjective  S: If I'm driving and I have to make a hard left it bothers me.    Currently in Pain? No/denies            [facility] OT Assessment - 04/05/16 1303      Assessment   Diagnosis Right Rotator Cuff Syndrome     Precautions   Precautions None                  OT Treatments/Exercises (OP) - 04/05/16 1304      Exercises   Exercises Shoulder     Shoulder Exercises: Supine   Protraction PROM;5 reps;AAROM;12 reps   Horizontal ABduction PROM;5 reps;AAROM;12 reps   External Rotation PROM;5 reps;AAROM;12 reps   Internal Rotation PROM;5 reps;AAROM;12 reps   Flexion PROM;5 reps;AAROM;12 reps  ABduction PROM;5 reps;AAROM;12 reps     Shoulder Exercises: Seated   Protraction AAROM;10 reps   Horizontal ABduction AAROM;10 reps   External Rotation AAROM;10 reps   Internal Rotation AAROM;10 reps   Flexion AAROM;10 reps   Abduction AAROM;10 reps     Shoulder Exercises: ROM/Strengthening   Proximal Shoulder Strengthening, Supine 10X each no rest breaks     Manual Therapy   Manual Therapy Myofascial release   Manual therapy comments manual therapy completed prior to therapeutic exercise   Myofascial Release myofascial release and manual stretching to right upper arm, scapular, and shoulder region to decrease pain and fascial restrictions and improve pain free mobility                  OT Short Term Goals - 03/28/16 1456      OT SHORT TERM GOAL #1   Title Patient will be educated and independent with HEP to increase functional use of right arm during daily tasks.   Time 4   Period Weeks   Status On-going     OT SHORT TERM GOAL #2   Title Patient will increase right shoulder A/ROM by 10 degrees for improved ability to comb her hair with less pain.     Time 4   Period Weeks   Status On-going     OT SHORT TERM GOAL #3   Title Patient will decrease pain in right shoulder to 4/10 with functional activities.    Time 4   Period Weeks   Status On-going           OT Long Term Goals - 03/28/16 1456      OT LONG TERM GOAL #1   Title Patient will use her right arm as dominant with all B/IADLs.    Time 8   Period Weeks   Status On-going     OT LONG TERM GOAL #2   Title Patient will demonstrate WFL A/ROM in her right shoulder in order to shampoo her hair without pain.    Time 8   Period Weeks   Status On-going     OT LONG TERM GOAL #3   Title Patient will improve right shoulder strength to 4+/5 for improved abilty to lift bags of groceries.    Time 8   Period Weeks   Status On-going     OT LONG TERM GOAL #4   Title Patient will decrease pain in right shoulder to 2/10 or less during functional activities.    Time 8   Period Weeks   Status On-going     OT LONG TERM GOAL #5   Title Patient will decrease fascial restrictions in right shoulder to minimal in order to have improved pain free mobility in right shoulder for ADL completion.    Time 8   Period Weeks   Status On-going               Plan - 04/05/16 1346    Clinical Impression Statement A: Pt completed AA/ROM supine and seated, requiring verbal cuing to remain alert and actively engaged. Pt requires verbal and visual cuing for correct form and technqiue. Unable to complete scapular theraband due to time constraints.    Plan P: Add pulleys abduction, attempt scapular theraband if pt able to tolerate   Consulted and Agree with Plan of Care Patient      Patient will benefit from skilled therapeutic intervention in order to improve the following deficits and impairments:  Decreased range of motion,  Decreased strength, Increased muscle spasms, Increased fascial restricitons, Impaired UE functional use, Pain  Visit Diagnosis: Other symptoms and signs involving  the musculoskeletal system  Stiffness of right shoulder, not elsewhere classified  Chronic right shoulder pain    Problem List Patient Active Problem List   Diagnosis Date Noted  . Acute blood loss anemia 09/10/2014  . GIB (gastrointestinal bleeding) 09/08/2014  . Occlusion and stenosis of carotid artery without mention of cerebral infarction 12/14/2013  . Osteoarthritis of left knee 04/23/2013  . Thumb pain 10/22/2012  . De Quervain's syndrome (tenosynovitis) 10/22/2012  . [facility] DJD(carpometacarpal degenerative joint disease), localized primary 10/22/2012  . CTS (carpal tunnel syndrome) 10/22/2012  . Trigger thumb of right hand 10/22/2012  . Pes anserinus bursitis of right knee 06/26/2012  . OA (osteoarthritis) of knee 06/26/2012  . Leg pain, left 06/26/2012  . Radicular pain of left lower extremity 06/26/2012  . Diverticulosis of colon with hemorrhage 09/22/2011  . Syncopal episodes 09/20/2011  . GI bleed 09/19/2011  . HTN (hypertension) 09/19/2011  . Anemia due to blood loss, acute 09/19/2011  . Abnormality of gait 01/03/2011  . Personal history of fall 12/27/2010  . DISLOCATION CLOSED SHOULDER NEC 04/25/2010  . ANSERINE BURSITIS, LEFT 02/13/2010  . KNEE PAIN 12/22/2009  . LEG PAIN, BILATERAL 12/22/2009  . TOTAL KNEE FOLLOW-UP 12/22/2009  . HIP PAIN 02/07/2009  . LOW BACK PAIN 02/07/2009   Guadelupe Sabin, OTR/L  [phone] 04/05/2016, 2:10 PM  [place] [address] [place], [place], [address] Phone: [phone]   Fax:  [phone]  Name: Cindy Schultz MRN: [id number] Date of Birth: 12/15/1923

## 2016-04-09 DIAGNOSIS — H401493 Capsular glaucoma with pseudoexfoliation of lens, unspecified eye, severe stage: Secondary | ICD-10-CM | POA: Diagnosis not present

## 2016-04-10 ENCOUNTER — Encounter (HOSPITAL_COMMUNITY): Payer: Self-pay

## 2016-04-10 ENCOUNTER — Ambulatory Visit (HOSPITAL_COMMUNITY): Payer: Medicare Other

## 2016-04-10 DIAGNOSIS — M25511 Pain in right shoulder: Secondary | ICD-10-CM

## 2016-04-10 DIAGNOSIS — R29898 Other symptoms and signs involving the musculoskeletal system: Secondary | ICD-10-CM

## 2016-04-10 DIAGNOSIS — M25611 Stiffness of right shoulder, not elsewhere classified: Secondary | ICD-10-CM | POA: Diagnosis not present

## 2016-04-10 DIAGNOSIS — G8929 Other chronic pain: Secondary | ICD-10-CM | POA: Diagnosis not present

## 2016-04-10 NOTE — Therapy (Signed)
[place], [place], [address] Phone: [phone]   Fax:  [phone]  Occupational Therapy Treatment  Patient Details  Name: Cindy Schultz MRN: [id number] Date of Birth: September 21, 1923 Referring Provider: Dr. Arther Abbott  Encounter Date: 04/10/2016      OT End of Session - 04/10/16 1058    Visit Number 6   Number of Visits 16   Date for OT Re-Evaluation 05/22/16  mini reassess on 04/22/16   Authorization Type Medicare    Authorization Time Period before 10th visit   Authorization - Visit Number 6   Authorization - Number of Visits 10   OT Start Time 1030   OT Stop Time 1115   OT Time Calculation (min) 45 min   Activity Tolerance Patient tolerated treatment well   Behavior During Therapy [facility] for tasks assessed/performed      Past Medical History:  Diagnosis Date  . Cancer [facility]) March 2016   Skin Cancer :  Right Cheek     SCC  . Cancer [facility]) 2013   Skin Cancer  :  Anterior Neck   SCC  . Carotid artery occlusion   . Diverticulitis   . DVT (deep venous thrombosis) ([place]) 07/14/10   LEA doppler   . Glaucoma   . Hearing loss   . HTN (hypertension)    cholesterol  . Lumbar spine pain   . Lymphedema   . Macular degeneration   . Pain in joints   . Precancerous changes of the cervix    lesions  . Rectocele   . SOB (shortness of breath) 04/04/2010   2D echo EF=>55%    Past Surgical History:  Procedure Laterality Date  . ABDOMINAL HYSTERECTOMY    . appendix    . bladder tac    . COLONOSCOPY  09/20/2011   Procedure: COLONOSCOPY;  Surgeon: Beryle Beams, MD;  Location: [facility] ENDOSCOPY;  Service: Endoscopy;  Laterality: N/A;  . corneal erosion     cataracts   . elevated metatarsal arch    . fallen uterus    . hemmorrhoidectomy    . left total knee replacement  05/02/04   [name]  . met osteostomy    . morton's nueroma    . OVARIAN CYST SURGERY    . RECTOCELE REPAIR    . rupture rt groin     . salk    . SKIN CANCER EXCISION Right March 2016  2013   Cheek -  [facility]  and  Anterior Neck  . tracheotomy      There were no vitals filed for this visit.      Subjective Assessment - 04/10/16 1051    Subjective  S: I don't push myself the way you do.   Currently in Pain? Yes   Pain Score 5    Pain Location Shoulder   Pain Orientation Right   Pain Descriptors / Indicators Aching   Pain Type Chronic pain   Pain Radiating Towards up neck and down elbow   Pain Onset More than a month ago   Pain Frequency Constant   Aggravating Factors  exercises and movement   Pain Relieving Factors rest   Effect of Pain on Daily Activities decreased use of dominant right arm   Multiple Pain Sites No            [facility] OT Assessment - 04/10/16 1050      Assessment   Diagnosis Right Rotator Cuff Syndrome  Precautions   Precautions None                  OT Treatments/Exercises (OP) - 04/10/16 1049      Exercises   Exercises Shoulder     Shoulder Exercises: Supine   Protraction PROM;5 reps   Horizontal ABduction PROM;5 reps   External Rotation PROM;5 reps   Internal Rotation PROM;5 reps   Flexion PROM;5 reps   ABduction PROM;5 reps     Shoulder Exercises: Seated   Protraction AAROM;10 reps   Horizontal ABduction AAROM;10 reps   External Rotation AAROM;10 reps   Internal Rotation AAROM;10 reps   Flexion AAROM;10 reps   Abduction AAROM;10 reps     Shoulder Exercises: Pulleys   Flexion 1 minute   ABduction 1 minute     Shoulder Exercises: ROM/Strengthening   Wall Wash 1'     Manual Therapy   Manual Therapy Myofascial release   Manual therapy comments manual therapy completed prior to therapeutic exercise   Myofascial Release myofascial release and manual stretching to right upper arm, scapular, and shoulder region to decrease pain and fascial restrictions and improve pain free mobility                OT Education - 04/10/16 1048    Education  provided Yes   Education Details AA/ROM Seated exercises   Person(s) Educated Patient   Methods Explanation;Demonstration;Handout;Verbal cues   Comprehension Returned demonstration;Verbalized understanding          OT Short Term Goals - 03/28/16 1456      OT SHORT TERM GOAL #1   Title Patient will be educated and independent with HEP to increase functional use of right arm during daily tasks.   Time 4   Period Weeks   Status On-going     OT SHORT TERM GOAL #2   Title Patient will increase right shoulder A/ROM by 10 degrees for improved ability to comb her hair with less pain.    Time 4   Period Weeks   Status On-going     OT SHORT TERM GOAL #3   Title Patient will decrease pain in right shoulder to 4/10 with functional activities.    Time 4   Period Weeks   Status On-going           OT Long Term Goals - 03/28/16 1456      OT LONG TERM GOAL #1   Title Patient will use her right arm as dominant with all B/IADLs.    Time 8   Period Weeks   Status On-going     OT LONG TERM GOAL #2   Title Patient will demonstrate WFL A/ROM in her right shoulder in order to shampoo her hair without pain.    Time 8   Period Weeks   Status On-going     OT LONG TERM GOAL #3   Title Patient will improve right shoulder strength to 4+/5 for improved abilty to lift bags of groceries.    Time 8   Period Weeks   Status On-going     OT LONG TERM GOAL #4   Title Patient will decrease pain in right shoulder to 2/10 or less during functional activities.    Time 8   Period Weeks   Status On-going     OT LONG TERM GOAL #5   Title Patient will decrease fascial restrictions in right shoulder to minimal in order to have improved pain free mobility in right shoulder for ADL  completion.    Time 8   Period Weeks   Status On-going               Plan - 04/10/16 1107    Clinical Impression Statement A: Updated HEP to include AA/ROM. Continues to be limited by pain and  drowsiness/fatigue during session. VC for form and technique. Patient reports limited carry over with HEP at home as she does not push herself.   Plan P: Attempt scapular theraband if able to tolerate.      Patient will benefit from skilled therapeutic intervention in order to improve the following deficits and impairments:  Decreased range of motion, Decreased strength, Increased muscle spasms, Increased fascial restricitons, Impaired UE functional use, Pain  Visit Diagnosis: Other symptoms and signs involving the musculoskeletal system  Stiffness of right shoulder, not elsewhere classified  Chronic right shoulder pain    Problem List Patient Active Problem List   Diagnosis Date Noted  . Acute blood loss anemia 09/10/2014  . GIB (gastrointestinal bleeding) 09/08/2014  . Occlusion and stenosis of carotid artery without mention of cerebral infarction 12/14/2013  . Osteoarthritis of left knee 04/23/2013  . Thumb pain 10/22/2012  . De Quervain's syndrome (tenosynovitis) 10/22/2012  . [facility] DJD(carpometacarpal degenerative joint disease), localized primary 10/22/2012  . CTS (carpal tunnel syndrome) 10/22/2012  . Trigger thumb of right hand 10/22/2012  . Pes anserinus bursitis of right knee 06/26/2012  . OA (osteoarthritis) of knee 06/26/2012  . Leg pain, left 06/26/2012  . Radicular pain of left lower extremity 06/26/2012  . Diverticulosis of colon with hemorrhage 09/22/2011  . Syncopal episodes 09/20/2011  . GI bleed 09/19/2011  . HTN (hypertension) 09/19/2011  . Anemia due to blood loss, acute 09/19/2011  . Abnormality of gait 01/03/2011  . Personal history of fall 12/27/2010  . DISLOCATION CLOSED SHOULDER NEC 04/25/2010  . ANSERINE BURSITIS, LEFT 02/13/2010  . KNEE PAIN 12/22/2009  . LEG PAIN, BILATERAL 12/22/2009  . TOTAL KNEE FOLLOW-UP 12/22/2009  . HIP PAIN 02/07/2009  . LOW BACK PAIN 02/07/2009   Ailene Ravel, OTR/L,CBIS  [phone]  04/10/2016, 11:17  AM  [place] [address] [place], [place], [address] Phone: [phone]   Fax:  [phone]  Name: Cindy Schultz MRN: [id number] Date of Birth: 1924-04-07

## 2016-04-10 NOTE — Patient Instructions (Signed)
Perform each exercise _____10-12___ reps. 2-3x days.   Protraction - STANDING  Start by holding a wand or cane at chest height.  Next, slowly push the wand outwards in front of your body so that your elbows become fully straightened. Then, return to the original position.     Shoulder FLEXION - STANDING - PALMS DOWN  In the standing position, hold a wand/cane with both arms, palms down on both sides. Raise up the wand/cane allowing your unaffected arm to perform most of the effort. Your affected arm should be partially relaxed.      Internal/External ROTATION - STANDING  In the standing position, hold a wand/cane with both hands keeping your elbows bent. Move your arms and wand/cane to one side.  Your affected arm should be partially relaxed while your unaffected arm performs most of the effort.       Shoulder ABDUCTION - STANDING  While holding a wand/cane palm face up on the injured side and palm face down on the uninjured side, slowly raise up your injured arm to the side.       Horizontal Abduction/Adduction      Straight arms holding cane at shoulder height, bring cane to right, center, left. Repeat starting to left.   Copyright  VHI. All rights reserved.       

## 2016-04-12 ENCOUNTER — Ambulatory Visit (HOSPITAL_COMMUNITY): Payer: Medicare Other | Admitting: Occupational Therapy

## 2016-04-12 ENCOUNTER — Encounter (HOSPITAL_COMMUNITY): Payer: Self-pay | Admitting: Occupational Therapy

## 2016-04-12 DIAGNOSIS — M25611 Stiffness of right shoulder, not elsewhere classified: Secondary | ICD-10-CM

## 2016-04-12 DIAGNOSIS — M25511 Pain in right shoulder: Secondary | ICD-10-CM

## 2016-04-12 DIAGNOSIS — G8929 Other chronic pain: Secondary | ICD-10-CM | POA: Diagnosis not present

## 2016-04-12 DIAGNOSIS — R29898 Other symptoms and signs involving the musculoskeletal system: Secondary | ICD-10-CM

## 2016-04-12 NOTE — Therapy (Signed)
[place], [place], [address] Phone: [phone]   Fax:  [phone]  Occupational Therapy Treatment  Patient Details  Name: Cindy Schultz MRN: [id number] Date of Birth: 1923-07-16 Referring Provider: Dr. Arther Abbott  Encounter Date: 04/12/2016      OT End of Session - 04/12/16 1115    Visit Number 7   Number of Visits 16   Date for OT Re-Evaluation 05/22/16  mini reassess on 04/22/16   Authorization Type Medicare    Authorization Time Period before 10th visit   Authorization - Visit Number 7   Authorization - Number of Visits 10   OT Start Time 1031   OT Stop Time 1113   OT Time Calculation (min) 42 min   Activity Tolerance Patient tolerated treatment well   Behavior During Therapy [facility] for tasks assessed/performed      Past Medical History:  Diagnosis Date  . Cancer [facility]) March 2016   Skin Cancer :  Right Cheek     SCC  . Cancer [facility]) 2013   Skin Cancer  :  Anterior Neck   SCC  . Carotid artery occlusion   . Diverticulitis   . DVT (deep venous thrombosis) ([place]) 07/14/10   LEA doppler   . Glaucoma   . Hearing loss   . HTN (hypertension)    cholesterol  . Lumbar spine pain   . Lymphedema   . Macular degeneration   . Pain in joints   . Precancerous changes of the cervix    lesions  . Rectocele   . SOB (shortness of breath) 04/04/2010   2D echo EF=>55%    Past Surgical History:  Procedure Laterality Date  . ABDOMINAL HYSTERECTOMY    . appendix    . bladder tac    . COLONOSCOPY  09/20/2011   Procedure: COLONOSCOPY;  Surgeon: Beryle Beams, MD;  Location: [facility] ENDOSCOPY;  Service: Endoscopy;  Laterality: N/A;  . corneal erosion     cataracts   . elevated metatarsal arch    . fallen uterus    . hemmorrhoidectomy    . left total knee replacement  05/02/04   [name]  . met osteostomy    . morton's nueroma    . OVARIAN CYST SURGERY    . RECTOCELE REPAIR    . rupture rt groin     . salk    . SKIN CANCER EXCISION Right March 2016  2013   Cheek -  [facility]  and  Anterior Neck  . tracheotomy      There were no vitals filed for this visit.      Subjective Assessment - 04/12/16 1030    Subjective  S: It's so sore I can't hardly raise it up to put on my clothes.    Currently in Pain? Yes   Pain Score 7    Pain Location Shoulder   Pain Orientation Right   Pain Descriptors / Indicators Aching   Pain Type Chronic pain   Pain Radiating Towards up neck and down to elbow   Pain Onset More than a month ago   Pain Frequency Constant   Aggravating Factors  exercises and movement   Pain Relieving Factors rest   Effect of Pain on Daily Activities decreased use of dominant right arm   Multiple Pain Sites No            [facility] OT Assessment - 04/12/16 1030      Assessment  Diagnosis Right Rotator Cuff Syndrome     Precautions   Precautions None                  OT Treatments/Exercises (OP) - 04/12/16 1035      Exercises   Exercises Shoulder     Shoulder Exercises: Supine   Protraction PROM;5 reps   Horizontal ABduction PROM;5 reps   External Rotation PROM;5 reps   Internal Rotation PROM;5 reps   Flexion PROM;5 reps   ABduction PROM;5 reps     Shoulder Exercises: Seated   Extension Theraband;10 reps   Theraband Level (Shoulder Extension) Level 2 (Red)   Retraction Theraband;10 reps   Theraband Level (Shoulder Retraction) Level 2 (Red)   Row Theraband;10 reps   Theraband Level (Shoulder Row) Level 2 (Red)   Protraction AAROM;12 reps   Horizontal ABduction AAROM;12 reps   External Rotation AAROM;12 reps   Internal Rotation AAROM;12 reps   Flexion AAROM;12 reps   Abduction AAROM;12 reps     Shoulder Exercises: ROM/Strengthening   UBE (Upper Arm Bike) Level 1 1' forward 1' reverse   Wall [place] 1'     Manual Therapy   Manual Therapy Myofascial release   Manual therapy comments manual therapy completed prior to therapeutic exercise    Myofascial Release myofascial release and manual stretching to right upper arm, scapular, and shoulder region to decrease pain and fascial restrictions and improve pain free mobility                  OT Short Term Goals - 03/28/16 1456      OT SHORT TERM GOAL #1   Title Patient will be educated and independent with HEP to increase functional use of right arm during daily tasks.   Time 4   Period Weeks   Status On-going     OT SHORT TERM GOAL #2   Title Patient will increase right shoulder A/ROM by 10 degrees for improved ability to comb her hair with less pain.    Time 4   Period Weeks   Status On-going     OT SHORT TERM GOAL #3   Title Patient will decrease pain in right shoulder to 4/10 with functional activities.    Time 4   Period Weeks   Status On-going           OT Long Term Goals - 03/28/16 1456      OT LONG TERM GOAL #1   Title Patient will use her right arm as dominant with all B/IADLs.    Time 8   Period Weeks   Status On-going     OT LONG TERM GOAL #2   Title Patient will demonstrate WFL A/ROM in her right shoulder in order to shampoo her hair without pain.    Time 8   Period Weeks   Status On-going     OT LONG TERM GOAL #3   Title Patient will improve right shoulder strength to 4+/5 for improved abilty to lift bags of groceries.    Time 8   Period Weeks   Status On-going     OT LONG TERM GOAL #4   Title Patient will decrease pain in right shoulder to 2/10 or less during functional activities.    Time 8   Period Weeks   Status On-going     OT LONG TERM GOAL #5   Title Patient will decrease fascial restrictions in right shoulder to minimal in order to have improved pain free mobility  in right shoulder for ADL completion.    Time 8   Period Weeks   Status On-going               Plan - 04/12/16 1115    Clinical Impression Statement A: Pt reports she has not been completed her HEP due to pain. Educated pt on trying to complete  HEP and benefits for helping reduce pain and tightness. Added scapular theraband, pt requried verbal and tactile cuing for correct form. Added UBE, min assist to initiate.    Plan P: Continue with scapular theraband, attempt A/ROM   Consulted and Agree with Plan of Care Patient      Patient will benefit from skilled therapeutic intervention in order to improve the following deficits and impairments:  Decreased range of motion, Decreased strength, Increased muscle spasms, Increased fascial restricitons, Impaired UE functional use, Pain  Visit Diagnosis: Other symptoms and signs involving the musculoskeletal system  Stiffness of right shoulder, not elsewhere classified  Chronic right shoulder pain    Problem List Patient Active Problem List   Diagnosis Date Noted  . Acute blood loss anemia 09/10/2014  . GIB (gastrointestinal bleeding) 09/08/2014  . Occlusion and stenosis of carotid artery without mention of cerebral infarction 12/14/2013  . Osteoarthritis of left knee 04/23/2013  . Thumb pain 10/22/2012  . De Quervain's syndrome (tenosynovitis) 10/22/2012  . [facility] DJD(carpometacarpal degenerative joint disease), localized primary 10/22/2012  . CTS (carpal tunnel syndrome) 10/22/2012  . Trigger thumb of right hand 10/22/2012  . Pes anserinus bursitis of right knee 06/26/2012  . OA (osteoarthritis) of knee 06/26/2012  . Leg pain, left 06/26/2012  . Radicular pain of left lower extremity 06/26/2012  . Diverticulosis of colon with hemorrhage 09/22/2011  . Syncopal episodes 09/20/2011  . GI bleed 09/19/2011  . HTN (hypertension) 09/19/2011  . Anemia due to blood loss, acute 09/19/2011  . Abnormality of gait 01/03/2011  . Personal history of fall 12/27/2010  . DISLOCATION CLOSED SHOULDER NEC 04/25/2010  . ANSERINE BURSITIS, LEFT 02/13/2010  . KNEE PAIN 12/22/2009  . LEG PAIN, BILATERAL 12/22/2009  . TOTAL KNEE FOLLOW-UP 12/22/2009  . HIP PAIN 02/07/2009  . LOW BACK PAIN 02/07/2009    Guadelupe Sabin, OTR/L  [phone] 04/12/2016, 11:17 AM  [place] [address] [place], [place], [address] Phone: [phone]   Fax:  [phone]  Name: ANTASIA HAIDER MRN: [id number] Date of Birth: Sep 18, 1923

## 2016-04-18 ENCOUNTER — Ambulatory Visit (HOSPITAL_COMMUNITY): Payer: Medicare Other | Attending: Orthopedic Surgery | Admitting: Occupational Therapy

## 2016-04-18 ENCOUNTER — Encounter (HOSPITAL_COMMUNITY): Payer: Self-pay | Admitting: Occupational Therapy

## 2016-04-18 DIAGNOSIS — G8929 Other chronic pain: Secondary | ICD-10-CM | POA: Diagnosis not present

## 2016-04-18 DIAGNOSIS — M25611 Stiffness of right shoulder, not elsewhere classified: Secondary | ICD-10-CM | POA: Insufficient documentation

## 2016-04-18 DIAGNOSIS — M25511 Pain in right shoulder: Secondary | ICD-10-CM | POA: Insufficient documentation

## 2016-04-18 DIAGNOSIS — R29898 Other symptoms and signs involving the musculoskeletal system: Secondary | ICD-10-CM | POA: Diagnosis not present

## 2016-04-18 NOTE — Therapy (Signed)
[place], [place], [address] Phone: [phone]   Fax:  [phone]  Occupational Therapy Treatment  Patient Details  Name: Cindy Schultz MRN: [id number] Date of Birth: 1923-12-03 Referring Provider: Dr. Arther Abbott  Encounter Date: 04/18/2016      OT End of Session - 04/18/16 1210    Visit Number 8   Number of Visits 16   Date for OT Re-Evaluation 05/22/16  mini reassess on 04/22/16   Authorization Type Medicare    Authorization Time Period before 10th visit   Authorization - Visit Number 8   Authorization - Number of Visits 10   OT Start Time 1121   OT Stop Time 1205   OT Time Calculation (min) 44 min   Activity Tolerance Patient tolerated treatment well   Behavior During Therapy [facility] for tasks assessed/performed      Past Medical History:  Diagnosis Date  . Cancer [facility]) March 2016   Skin Cancer :  Right Cheek     SCC  . Cancer [facility]) 2013   Skin Cancer  :  Anterior Neck   SCC  . Carotid artery occlusion   . Diverticulitis   . DVT (deep venous thrombosis) ([place]) 07/14/10   LEA doppler   . Glaucoma   . Hearing loss   . HTN (hypertension)    cholesterol  . Lumbar spine pain   . Lymphedema   . Macular degeneration   . Pain in joints   . Precancerous changes of the cervix    lesions  . Rectocele   . SOB (shortness of breath) 04/04/2010   2D echo EF=>55%    Past Surgical History:  Procedure Laterality Date  . ABDOMINAL HYSTERECTOMY    . appendix    . bladder tac    . COLONOSCOPY  09/20/2011   Procedure: COLONOSCOPY;  Surgeon: Beryle Beams, MD;  Location: [facility] ENDOSCOPY;  Service: Endoscopy;  Laterality: N/A;  . corneal erosion     cataracts   . elevated metatarsal arch    . fallen uterus    . hemmorrhoidectomy    . left total knee replacement  05/02/04   [name]  . met osteostomy    . morton's nueroma    . OVARIAN CYST SURGERY    . RECTOCELE REPAIR    . rupture rt groin    .  salk    . SKIN CANCER EXCISION Right March 2016  2013   Cheek -  [facility]  and  Anterior Neck  . tracheotomy      There were no vitals filed for this visit.      Subjective Assessment - 04/18/16 1121    Subjective  S: I can't hardly use my right arm to turn the steering wheel of the car.    Currently in Pain? Yes   Pain Score 9    Pain Location Shoulder   Pain Orientation Right   Pain Descriptors / Indicators Aching   Pain Type Chronic pain   Pain Radiating Towards up neck and down to elbow   Pain Onset More than a month ago   Pain Frequency Constant   Aggravating Factors  exercises and movement   Pain Relieving Factors rest    Effect of Pain on Daily Activities limited use of RUE during daily tasks   Multiple Pain Sites No            [facility] OT Assessment - 04/18/16 1121  Assessment   Diagnosis Right Rotator Cuff Syndrome     Precautions   Precautions None                  OT Treatments/Exercises (OP) - 04/18/16 1126      Exercises   Exercises Shoulder     Shoulder Exercises: Supine   Protraction PROM;5 reps   Horizontal ABduction PROM;5 reps   External Rotation PROM;5 reps   Internal Rotation PROM;5 reps   Flexion PROM;5 reps   ABduction PROM;5 reps     Shoulder Exercises: Seated   Protraction AAROM;12 reps   Horizontal ABduction AAROM;12 reps   External Rotation AAROM;12 reps   Internal Rotation AAROM;12 reps   Flexion AAROM;12 reps   Abduction AAROM;12 reps     Modalities   Modalities Moist Heat     Moist Heat Therapy   Number Minutes Moist Heat 10 Minutes   Moist Heat Location Shoulder     Manual Therapy   Manual Therapy Myofascial release   Manual therapy comments manual therapy completed prior to therapeutic exercise   Myofascial Release myofascial release and manual stretching to right upper arm, scapular, and shoulder region to decrease pain and fascial restrictions and improve pain free mobility                  OT  Short Term Goals - 03/28/16 1456      OT SHORT TERM GOAL #1   Title Patient will be educated and independent with HEP to increase functional use of right arm during daily tasks.   Time 4   Period Weeks   Status On-going     OT SHORT TERM GOAL #2   Title Patient will increase right shoulder A/ROM by 10 degrees for improved ability to comb her hair with less pain.    Time 4   Period Weeks   Status On-going     OT SHORT TERM GOAL #3   Title Patient will decrease pain in right shoulder to 4/10 with functional activities.    Time 4   Period Weeks   Status On-going           OT Long Term Goals - 03/28/16 1456      OT LONG TERM GOAL #1   Title Patient will use her right arm as dominant with all B/IADLs.    Time 8   Period Weeks   Status On-going     OT LONG TERM GOAL #2   Title Patient will demonstrate WFL A/ROM in her right shoulder in order to shampoo her hair without pain.    Time 8   Period Weeks   Status On-going     OT LONG TERM GOAL #3   Title Patient will improve right shoulder strength to 4+/5 for improved abilty to lift bags of groceries.    Time 8   Period Weeks   Status On-going     OT LONG TERM GOAL #4   Title Patient will decrease pain in right shoulder to 2/10 or less during functional activities.    Time 8   Period Weeks   Status On-going     OT LONG TERM GOAL #5   Title Patient will decrease fascial restrictions in right shoulder to minimal in order to have improved pain free mobility in right shoulder for ADL completion.    Time 8   Period Weeks   Status On-going  Plan - 04/18/16 1210    Clinical Impression Statement A: Pt reports pain this am, reports she is not completing her HEP or using heat on shoulder as discussed last session. Discussed importance of completing HEP and stretching her shoulder with pt. Pt able to complete seated AA/ROM exercises with ROM WFL. Moist heat applied at end of session, pt reports pain as 4/10.     Plan P: Update G-Code, mini-reassessment. Discuss with pt if she wants to continue therapy or discharge   Consulted and Agree with Plan of Care Patient      Patient will benefit from skilled therapeutic intervention in order to improve the following deficits and impairments:  Decreased range of motion, Decreased strength, Increased muscle spasms, Increased fascial restricitons, Impaired UE functional use, Pain  Visit Diagnosis: Other symptoms and signs involving the musculoskeletal system  Stiffness of right shoulder, not elsewhere classified  Chronic right shoulder pain    Problem List Patient Active Problem List   Diagnosis Date Noted  . Acute blood loss anemia 09/10/2014  . GIB (gastrointestinal bleeding) 09/08/2014  . Occlusion and stenosis of carotid artery without mention of cerebral infarction 12/14/2013  . Osteoarthritis of left knee 04/23/2013  . Thumb pain 10/22/2012  . De Quervain's syndrome (tenosynovitis) 10/22/2012  . [facility] DJD(carpometacarpal degenerative joint disease), localized primary 10/22/2012  . CTS (carpal tunnel syndrome) 10/22/2012  . Trigger thumb of right hand 10/22/2012  . Pes anserinus bursitis of right knee 06/26/2012  . OA (osteoarthritis) of knee 06/26/2012  . Leg pain, left 06/26/2012  . Radicular pain of left lower extremity 06/26/2012  . Diverticulosis of colon with hemorrhage 09/22/2011  . Syncopal episodes 09/20/2011  . GI bleed 09/19/2011  . HTN (hypertension) 09/19/2011  . Anemia due to blood loss, acute 09/19/2011  . Abnormality of gait 01/03/2011  . Personal history of fall 12/27/2010  . DISLOCATION CLOSED SHOULDER NEC 04/25/2010  . ANSERINE BURSITIS, LEFT 02/13/2010  . KNEE PAIN 12/22/2009  . LEG PAIN, BILATERAL 12/22/2009  . TOTAL KNEE FOLLOW-UP 12/22/2009  . HIP PAIN 02/07/2009  . LOW BACK PAIN 02/07/2009   Guadelupe Sabin, OTR/L  [phone] 04/18/2016, 12:12 PM  [place] [address] [place], [place], [address] Phone: [phone]   Fax:  [phone]  Name: Cindy Schultz MRN: [id number] Date of Birth: Sep 24, 1923

## 2016-04-20 ENCOUNTER — Ambulatory Visit (HOSPITAL_COMMUNITY): Payer: Medicare Other | Admitting: Occupational Therapy

## 2016-04-23 ENCOUNTER — Ambulatory Visit (HOSPITAL_COMMUNITY): Payer: Medicare Other | Admitting: Specialist

## 2016-04-23 DIAGNOSIS — M25611 Stiffness of right shoulder, not elsewhere classified: Secondary | ICD-10-CM

## 2016-04-23 DIAGNOSIS — G8929 Other chronic pain: Secondary | ICD-10-CM

## 2016-04-23 DIAGNOSIS — M25511 Pain in right shoulder: Secondary | ICD-10-CM

## 2016-04-23 DIAGNOSIS — R29898 Other symptoms and signs involving the musculoskeletal system: Secondary | ICD-10-CM | POA: Diagnosis not present

## 2016-04-23 DIAGNOSIS — M1711 Unilateral primary osteoarthritis, right knee: Secondary | ICD-10-CM | POA: Diagnosis not present

## 2016-04-23 DIAGNOSIS — J209 Acute bronchitis, unspecified: Secondary | ICD-10-CM | POA: Diagnosis not present

## 2016-04-23 NOTE — Therapy (Signed)
[place], [place], [address] Phone: [phone]   Fax:  [phone]  Occupational Therapy Treatment  Patient Details  Name: Cindy Schultz MRN: [id number] Date of Birth: Jun 01, 1924 Referring Provider: Dr. Arther Abbott  Encounter Date: 04/23/2016      OT End of Session - 04/23/16 1513    Visit Number 9   Number of Visits 16   Date for OT Re-Evaluation 05/22/16   Authorization Type Medicare    Authorization Time Period before 19th visit   Authorization - Visit Number 9   Authorization - Number of Visits 19   OT Start Time 1120   OT Stop Time 1200   OT Time Calculation (min) 40 min   Activity Tolerance Patient limited by pain   Behavior During Therapy [facility] for tasks assessed/performed      Past Medical History:  Diagnosis Date  . Cancer [facility]) March 2016   Skin Cancer :  Right Cheek     SCC  . Cancer [facility]) 2013   Skin Cancer  :  Anterior Neck   SCC  . Carotid artery occlusion   . Diverticulitis   . DVT (deep venous thrombosis) ([place]) 07/14/10   LEA doppler   . Glaucoma   . Hearing loss   . HTN (hypertension)    cholesterol  . Lumbar spine pain   . Lymphedema   . Macular degeneration   . Pain in joints   . Precancerous changes of the cervix    lesions  . Rectocele   . SOB (shortness of breath) 04/04/2010   2D echo EF=>55%    Past Surgical History:  Procedure Laterality Date  . ABDOMINAL HYSTERECTOMY    . appendix    . bladder tac    . COLONOSCOPY  09/20/2011   Procedure: COLONOSCOPY;  Surgeon: Beryle Beams, MD;  Location: [facility] ENDOSCOPY;  Service: Endoscopy;  Laterality: N/A;  . corneal erosion     cataracts   . elevated metatarsal arch    . fallen uterus    . hemmorrhoidectomy    . left total knee replacement  05/02/04   [name]  . met osteostomy    . morton's nueroma    . OVARIAN CYST SURGERY    . RECTOCELE REPAIR    . rupture rt groin    . salk    . SKIN CANCER EXCISION  Right March 2016  2013   Cheek -  [facility]  and  Anterior Neck  . tracheotomy      There were no vitals filed for this visit.      Subjective Assessment - 04/23/16 1122    Subjective  S:  I cant reach up with my right arm, I have to help it with my left arm.    Currently in Pain? Yes   Pain Score 10-Worst pain ever   Pain Location Shoulder   Pain Orientation Right   Pain Descriptors / Indicators Aching   Pain Type Chronic pain   Pain Onset More than a month ago   Pain Frequency Constant   Aggravating Factors  movement   Pain Relieving Factors rest and heat   Effect of Pain on Daily Activities limited use of RUE with funcitonal activities             [facility] OT Assessment - 04/23/16 0001      Assessment   Diagnosis Right Rotator Cuff Syndrome   Referring Provider Dr. Arther Abbott  Precautions   Precautions None     ADL   ADL comments Patient has experienced increased difficulty with her right shoulder movement, she must now lift her right arm with her left to complete any functional activity above waist height.      Observation/Other Assessments   Focus on Therapeutic Outcomes (FOTO)  38/100  was 50/100 at eval     Palpation   Palpation comment moderate fascial restrictions      AROM   Overall AROM Comments assessed in seated, external rotation and internal rotation with shoulder  (03/23/16)   AROM Assessment Site Shoulder   Right/Left Shoulder Right   Right Shoulder Flexion 20 Degrees  113   Right Shoulder ABduction 65 Degrees  99   Right Shoulder Internal Rotation 80 Degrees  85   Right Shoulder External Rotation 60 Degrees  60     PROM   Overall PROM Comments WFL in supine, very painful ranging arm from full flexion and abduction to resting neutral      Strength   Overall Strength Comments unable to assess this date as patient was unable to position her arm correctly.                   OT Treatments/Exercises (OP) - 04/23/16 0001       Exercises   Exercises Shoulder     Manual Therapy   Manual Therapy Myofascial release   Manual therapy comments manual therapy completed prior to therapeutic exercise   Myofascial Release myofascial release and manual stretching to right upper arm, scapular, and shoulder region to decrease pain and fascial restrictions and improve pain free mobility                  OT Short Term Goals - 04/23/16 1143      OT SHORT TERM GOAL #1   Title Patient will be educated and independent with HEP to increase functional use of right arm during daily tasks.   Time 4   Period Weeks   Status Achieved     OT SHORT TERM GOAL #2   Title Patient will increase right shoulder A/ROM by 10 degrees for improved ability to comb her hair with less pain.    Time 4   Period Weeks   Status Not Met     OT SHORT TERM GOAL #3   Title Patient will decrease pain in right shoulder to 4/10 with functional activities.    Time 4   Period Weeks   Status Not Met           OT Long Term Goals - 04/23/16 1144      OT LONG TERM GOAL #1   Title Patient will use her right arm as dominant with all B/IADLs.    Time 8   Period Weeks   Status Not Met     OT LONG TERM GOAL #2   Title Patient will demonstrate WFL A/ROM in her right shoulder in order to shampoo her hair without pain.    Time 8   Period Weeks   Status Not Met     OT LONG TERM GOAL #3   Title Patient will improve right shoulder strength to 4+/5 for improved abilty to lift bags of groceries.    Time 8   Period Weeks   Status Not Met     OT LONG TERM GOAL #4   Title Patient will decrease pain in right shoulder to 2/10 or less during functional activities.  Time 8   Period Weeks   Status Not Met     OT LONG TERM GOAL #5   Title Patient will decrease fascial restrictions in right shoulder to minimal in order to have improved pain free mobility in right shoulder for ADL completion.    Time 8   Period Weeks   Status Not Met                Plan - May 13, 2016 1513    Clinical Impression Statement A:  Patient has regressed in A/ROM and pain level with skilled therapy intervention.  She now requires physical assistance to flex or abduct her right arm above waist height, complains of 9/10 pain in her arm.  Drop Arm test is positive.  After discussion with patient, we have decided to dc skilled therapy and patient will follow up with MD as to next course of treatment.    Plan P:  DC secondary to lack of progress.   Consulted and Agree with Plan of Care Patient      Patient will benefit from skilled therapeutic intervention in order to improve the following deficits and impairments:  Decreased range of motion, Decreased strength, Increased muscle spasms, Increased fascial restricitons, Impaired UE functional use, Pain  Visit Diagnosis: Other symptoms and signs involving the musculoskeletal system  Stiffness of right shoulder, not elsewhere classified  Chronic right shoulder pain      G-Codes - 05-13-2016 1515    Carrying, Moving and Handling Objects Goal Status (E9528) At least 20 percent but less than 40 percent impaired, limited or restricted   Carrying, Moving and Handling Objects Discharge Status [phone]) At least 60 percent but less than 80 percent impaired, limited or restricted      Problem List Patient Active Problem List   Diagnosis Date Noted  . Acute blood loss anemia 09/10/2014  . GIB (gastrointestinal bleeding) 09/08/2014  . Occlusion and stenosis of carotid artery without mention of cerebral infarction 12/14/2013  . Osteoarthritis of left knee May 13, 2013  . Thumb pain 10/22/2012  . De Quervain's syndrome (tenosynovitis) 10/22/2012  . [facility] DJD(carpometacarpal degenerative joint disease), localized primary 10/22/2012  . CTS (carpal tunnel syndrome) 10/22/2012  . Trigger thumb of right hand 10/22/2012  . Pes anserinus bursitis of right knee 06/26/2012  . OA (osteoarthritis) of knee 06/26/2012   . Leg pain, left 06/26/2012  . Radicular pain of left lower extremity 06/26/2012  . Diverticulosis of colon with hemorrhage 09/22/2011  . Syncopal episodes 09/20/2011  . GI bleed 09/19/2011  . HTN (hypertension) 09/19/2011  . Anemia due to blood loss, acute 09/19/2011  . Abnormality of gait 01/03/2011  . Personal history of fall 12/27/2010  . DISLOCATION CLOSED SHOULDER NEC 04/25/2010  . ANSERINE BURSITIS, LEFT 02/13/2010  . KNEE PAIN 12/22/2009  . LEG PAIN, BILATERAL 12/22/2009  . TOTAL KNEE FOLLOW-UP 12/22/2009  . HIP PAIN 02/07/2009  . LOW BACK PAIN 02/07/2009    Vangie Bicker, [place], OTR/L [phone]  05-13-2016, 3:16 PM OCCUPATIONAL THERAPY DISCHARGE SUMMARY  Visits from Start of Care: 9  Current functional level related to goals / functional outcomes: Decreased use of right arm above waist height has to physically assist with her left arm.  9/10 pain.   Remaining deficits: Unable to use her right arm functionally.    Education / Equipment: Table slides Plan: Patient agrees to discharge.  Patient goals were not met. Patient is being discharged due to lack of progress.  ?????    Vangie Bicker,  [place], OTR/L [place] [address] [place], [place], [address] Phone: [phone]   Fax:  [phone]  Name: DAISSY YERIAN MRN: [id number] Date of Birth: 01/23/1924

## 2016-04-26 DIAGNOSIS — I10 Essential (primary) hypertension: Secondary | ICD-10-CM | POA: Diagnosis not present

## 2016-04-26 DIAGNOSIS — Z79899 Other long term (current) drug therapy: Secondary | ICD-10-CM | POA: Diagnosis not present

## 2016-04-26 DIAGNOSIS — H43813 Vitreous degeneration, bilateral: Secondary | ICD-10-CM | POA: Diagnosis not present

## 2016-04-26 DIAGNOSIS — Z961 Presence of intraocular lens: Secondary | ICD-10-CM | POA: Diagnosis not present

## 2016-04-26 DIAGNOSIS — H3530 Unspecified macular degeneration: Secondary | ICD-10-CM | POA: Diagnosis not present

## 2016-04-26 DIAGNOSIS — E559 Vitamin D deficiency, unspecified: Secondary | ICD-10-CM | POA: Diagnosis not present

## 2016-04-26 DIAGNOSIS — D509 Iron deficiency anemia, unspecified: Secondary | ICD-10-CM | POA: Diagnosis not present

## 2016-04-26 DIAGNOSIS — R809 Proteinuria, unspecified: Secondary | ICD-10-CM | POA: Diagnosis not present

## 2016-04-26 DIAGNOSIS — N183 Chronic kidney disease, stage 3 (moderate): Secondary | ICD-10-CM | POA: Diagnosis not present

## 2016-04-26 DIAGNOSIS — Z9849 Cataract extraction status, unspecified eye: Secondary | ICD-10-CM | POA: Diagnosis not present

## 2016-04-27 DIAGNOSIS — M25511 Pain in right shoulder: Secondary | ICD-10-CM | POA: Diagnosis not present

## 2016-04-27 DIAGNOSIS — M7581 Other shoulder lesions, right shoulder: Secondary | ICD-10-CM | POA: Diagnosis not present

## 2016-05-02 DIAGNOSIS — I10 Essential (primary) hypertension: Secondary | ICD-10-CM | POA: Diagnosis not present

## 2016-05-02 DIAGNOSIS — R809 Proteinuria, unspecified: Secondary | ICD-10-CM | POA: Diagnosis not present

## 2016-05-02 DIAGNOSIS — N184 Chronic kidney disease, stage 4 (severe): Secondary | ICD-10-CM | POA: Diagnosis not present

## 2016-05-02 DIAGNOSIS — D649 Anemia, unspecified: Secondary | ICD-10-CM | POA: Diagnosis not present

## 2016-05-04 ENCOUNTER — Ambulatory Visit (INDEPENDENT_AMBULATORY_CARE_PROVIDER_SITE_OTHER): Payer: Medicare Other | Admitting: Orthopedic Surgery

## 2016-05-04 ENCOUNTER — Encounter: Payer: Self-pay | Admitting: Orthopedic Surgery

## 2016-05-04 DIAGNOSIS — I6523 Occlusion and stenosis of bilateral carotid arteries: Secondary | ICD-10-CM | POA: Diagnosis not present

## 2016-05-04 DIAGNOSIS — M25511 Pain in right shoulder: Secondary | ICD-10-CM | POA: Diagnosis not present

## 2016-05-04 DIAGNOSIS — M75101 Unspecified rotator cuff tear or rupture of right shoulder, not specified as traumatic: Secondary | ICD-10-CM

## 2016-05-04 NOTE — Addendum Note (Signed)
Addended by: Lianne Cure A on: 05/04/2016 12:20 PM   Modules accepted: Orders

## 2016-05-04 NOTE — Progress Notes (Signed)
Patient ID: Cindy Schultz, female   DOB: 1923/06/21, 82 y.o.   MRN: [id number]  Chief Complaint  Patient presents with  . Shoulder Pain    RIGHT SHOULDER PAIN    HPI Cindy Schultz is a 82 y.o. female.   HPI 82 year old female thought to have rotator cuff tear set for physical therapy got worse comes in complaining of acute pain in the right shoulder decreased for elevation painful [place] elevation loss of motion  Physical therapy made the shoulder worse  Review of Systems Review of Systems Normal neuro  Denies fever  Examination There were no vitals taken for this visit.  Gen. appearance the patient's appearance is normal with normal grooming and  hygiene The patient is oriented to person place and time Mood depressed affect flat Ortho Exam  Inspection reveals tenderness in the medial port of the right scapula as well as the lateral deltoid posterior deltoid anterior deltoid ROM is external rotation 50 passive passive flexion 150 with pain especially on descent of the arm Stability tests are normal  Motor, we do see weakness and supraspinatus no atrophy  Skin is normal (no rash or erythema) , there is ecchymosis anteriorly from an anterior injection   Medical decision-making Diagnosis, Data, Plan (risk)  Encounter Diagnoses  Name Primary?  . Tear of right rotator cuff, unspecified tear extent Yes  . Acute pain of right shoulder     Procedure note the subacromial injection shoulder RIGHT  Verbal consent was obtained to inject the  RIGHT   Shoulder  Timeout was completed to confirm the injection site is a subacromial space of the  RIGHT  shoulder   Medication used Depo-Medrol 40 mg and lidocaine 1% 3 cc  Anesthesia was provided by ethyl chloride  The injection was performed in the RIGHT  posterior subacromial space. After pinning the skin with alcohol and anesthetized the skin with ethyl chloride the subacromial space was injected using a 20-gauge needle.  There were no complications  Sterile dressing was applied.  Follow-up in 2 weeks if no improvement patient will need MRI  Arther Abbott, MD 05/04/2016 9:48 AM

## 2016-05-04 NOTE — Patient Instructions (Signed)

## 2016-05-22 ENCOUNTER — Ambulatory Visit (INDEPENDENT_AMBULATORY_CARE_PROVIDER_SITE_OTHER): Payer: Medicare Other | Admitting: Orthopedic Surgery

## 2016-05-22 ENCOUNTER — Encounter: Payer: Self-pay | Admitting: Orthopedic Surgery

## 2016-05-22 DIAGNOSIS — I6523 Occlusion and stenosis of bilateral carotid arteries: Secondary | ICD-10-CM | POA: Diagnosis not present

## 2016-05-22 DIAGNOSIS — M75101 Unspecified rotator cuff tear or rupture of right shoulder, not specified as traumatic: Secondary | ICD-10-CM

## 2016-05-22 DIAGNOSIS — M25561 Pain in right knee: Secondary | ICD-10-CM

## 2016-05-22 DIAGNOSIS — G8929 Other chronic pain: Secondary | ICD-10-CM

## 2016-05-22 MED ORDER — TRAMADOL HCL 50 MG PO TABS: 50.0000 mg | ORAL_TABLET | Freq: Four times a day (QID) | ORAL | 5 refills | Status: DC | PRN

## 2016-05-22 MED ORDER — PROMETHAZINE HCL 12.5 MG PO TABS: 12.5000 mg | ORAL_TABLET | Freq: Four times a day (QID) | ORAL | 2 refills | Status: DC | PRN

## 2016-05-22 NOTE — Patient Instructions (Signed)
Meds ordered this encounter  Medications  . promethazine (PHENERGAN) 12.5 MG tablet    Sig: Take 1 tablet (12.5 mg total) by mouth every 6 (six) hours as needed for nausea or vomiting.    Dispense:  60 tablet    Refill:  2  . traMADol (ULTRAM) 50 MG tablet    Sig: Take 1 tablet (50 mg total) by mouth every 6 (six) hours as needed.    Dispense:  60 tablet    Refill:  5

## 2016-05-22 NOTE — Progress Notes (Signed)
Patient ID: Cindy Schultz, female   DOB: 06-10-1924, 82 y.o.   MRN: [id number]  Chief Complaint  Patient presents with  . Follow-up    right shoulder pain    HPI Cindy Schultz is a 82 y.o. female.   HPI  H/O rt shoulder dislocation 2012, presented in June with pain right shoulder no trauma, thought to have rotator cuff syndrome sent for therapy after injection, therapy made the pain worse, the injection was repeated and did not help;   Review of Systems Review of Systems  neuro normal   fever none   Physical Exam   Physical Exam Repeat physical exam reveals the patient has active abduction of 70 and flexion of 50 passive range of motion is full but painful throughout the range of motion. She has weakness in the rotator cuff especially the supraspinatus tendon with a positive drop test grade 3 weakness the shoulder is stable and neurovascular exam remains normal there is no epitrochlear axillary or supraclavicular lymph nodes.   Probable rotator cuff tear right shoulder   rec MRI right shoulder

## 2016-05-24 ENCOUNTER — Ambulatory Visit (HOSPITAL_COMMUNITY)
Admission: RE | Admit: 2016-05-24 | Discharge: 2016-05-24 | Disposition: A | Discharge status: Home / Self Care | Payer: Medicare Other | Source: Ambulatory Visit | Attending: Orthopedic Surgery | Admitting: Orthopedic Surgery

## 2016-05-24 DIAGNOSIS — M75101 Unspecified rotator cuff tear or rupture of right shoulder, not specified as traumatic: Secondary | ICD-10-CM

## 2016-05-24 DIAGNOSIS — M25411 Effusion, right shoulder: Secondary | ICD-10-CM | POA: Insufficient documentation

## 2016-05-24 DIAGNOSIS — M19011 Primary osteoarthritis, right shoulder: Secondary | ICD-10-CM | POA: Insufficient documentation

## 2016-05-29 ENCOUNTER — Ambulatory Visit (INDEPENDENT_AMBULATORY_CARE_PROVIDER_SITE_OTHER): Payer: Medicare Other | Admitting: Orthopedic Surgery

## 2016-05-29 DIAGNOSIS — M75101 Unspecified rotator cuff tear or rupture of right shoulder, not specified as traumatic: Secondary | ICD-10-CM | POA: Diagnosis not present

## 2016-05-29 DIAGNOSIS — I6523 Occlusion and stenosis of bilateral carotid arteries: Secondary | ICD-10-CM | POA: Diagnosis not present

## 2016-05-29 NOTE — Progress Notes (Signed)
Follow-up MRI right shoulder for right shoulder pain and weakness  Patient's MRI shows that she has a new tear of the infraspinatus tendon but it's at the musculotendinous junction, supraspinatus tendon tear with atrophy  Discussed this with her at length  She is 82 years old she says that she probably shouldn't have any surgery and she is afraid of having to go to the nursing home if we do repair the rotator cuff and she stuck in a shoulder immobilizer  She is on tramadol so we will continue with that she's already had physical therapy and injection  I will see her in 2 months to see how she is doing or if she decides to change her mind. She does understand that I may go into the shoulder and not be able to repair the tendon and just have to do a debridement  IMPRESSION: 1. Large chronic full-thickness retracted tear of the supraspinous with severe atrophy of that muscle. 2. Acute or subacute tear of the musculotendinous junction of the infraspinatus with prominent edema in and around that muscle. 3. Severe AC joint arthropathy with a type 3 acromion which could predispose to impingement. 4. Moderate glenohumeral joint effusion with slight osteoarthritis of the glenohumeral joint.     Electronically Signed   By: Lorriane Shire M.D.   On: 05/25/2016 08:54

## 2016-05-31 ENCOUNTER — Ambulatory Visit: Payer: Medicare Other | Admitting: Orthopedic Surgery

## 2016-05-31 DIAGNOSIS — H3530 Unspecified macular degeneration: Secondary | ICD-10-CM | POA: Diagnosis not present

## 2016-05-31 DIAGNOSIS — H401134 Primary open-angle glaucoma, bilateral, indeterminate stage: Secondary | ICD-10-CM | POA: Diagnosis not present

## 2016-05-31 DIAGNOSIS — H16223 Keratoconjunctivitis sicca, not specified as Sjogren's, bilateral: Secondary | ICD-10-CM | POA: Diagnosis not present

## 2016-06-05 ENCOUNTER — Ambulatory Visit: Payer: Medicare Other | Admitting: Orthopedic Surgery

## 2016-06-13 DIAGNOSIS — J209 Acute bronchitis, unspecified: Secondary | ICD-10-CM | POA: Diagnosis not present

## 2016-06-13 DIAGNOSIS — I10 Essential (primary) hypertension: Secondary | ICD-10-CM | POA: Diagnosis not present

## 2016-06-28 DIAGNOSIS — G5600 Carpal tunnel syndrome, unspecified upper limb: Secondary | ICD-10-CM | POA: Diagnosis not present

## 2016-06-28 DIAGNOSIS — I10 Essential (primary) hypertension: Secondary | ICD-10-CM | POA: Diagnosis not present

## 2016-06-28 DIAGNOSIS — W19XXXA Unspecified fall, initial encounter: Secondary | ICD-10-CM | POA: Diagnosis not present

## 2016-07-03 DIAGNOSIS — M545 Low back pain: Secondary | ICD-10-CM | POA: Diagnosis not present

## 2016-07-03 DIAGNOSIS — M159 Polyosteoarthritis, unspecified: Secondary | ICD-10-CM | POA: Diagnosis not present

## 2016-07-03 DIAGNOSIS — I10 Essential (primary) hypertension: Secondary | ICD-10-CM | POA: Diagnosis not present

## 2016-07-03 DIAGNOSIS — W19XXXA Unspecified fall, initial encounter: Secondary | ICD-10-CM | POA: Diagnosis not present

## 2016-07-09 DIAGNOSIS — H401493 Capsular glaucoma with pseudoexfoliation of lens, unspecified eye, severe stage: Secondary | ICD-10-CM | POA: Diagnosis not present

## 2016-07-11 ENCOUNTER — Ambulatory Visit (HOSPITAL_COMMUNITY): Payer: Medicare Other | Attending: Orthopedic Surgery

## 2016-07-11 DIAGNOSIS — M62830 Muscle spasm of back: Secondary | ICD-10-CM | POA: Insufficient documentation

## 2016-07-11 DIAGNOSIS — R262 Difficulty in walking, not elsewhere classified: Secondary | ICD-10-CM

## 2016-07-11 DIAGNOSIS — M545 Low back pain, unspecified: Secondary | ICD-10-CM

## 2016-07-11 DIAGNOSIS — R29898 Other symptoms and signs involving the musculoskeletal system: Secondary | ICD-10-CM | POA: Insufficient documentation

## 2016-07-11 NOTE — Therapy (Signed)
[place], [place], [address] Phone: [phone]   Fax:  [phone]  Physical Therapy Evaluation  Patient Details  Name: Cindy Schultz MRN: [id number] Date of Birth: Oct 03, 1923 Referring Provider: Sinda Du  Encounter Date: 07/11/2016      PT End of Session - 07/11/16 1527    Visit Number 1   Number of Visits 12   Date for PT Re-Evaluation 08/11/16   Authorization Type Medicare Traditional    Authorization Time Period 07/11/16-08/11/16   Authorization - Visit Number 1   Authorization - Number of Visits 10   PT Start Time 6834   PT Stop Time 1517   PT Time Calculation (min) 40 min   Activity Tolerance Patient tolerated treatment well;Patient limited by pain   Behavior During Therapy [facility] for tasks assessed/performed      Past Medical History:  Diagnosis Date  . Cancer [facility]) March 2016   Skin Cancer :  Right Cheek     SCC  . Cancer [facility] 2013   Skin Cancer  :  Anterior Neck   SCC  . Carotid artery occlusion   . Diverticulitis   . DVT (deep venous thrombosis) ([place]) 07/14/10   LEA doppler   . Glaucoma   . Hearing loss   . HTN (hypertension)    cholesterol  . Lumbar spine pain   . Lymphedema   . Macular degeneration   . Pain in joints   . Precancerous changes of the cervix    lesions  . Rectocele   . SOB (shortness of breath) 04/04/2010   2D echo EF=>55%    Past Surgical History:  Procedure Laterality Date  . ABDOMINAL HYSTERECTOMY    . appendix    . bladder tac    . COLONOSCOPY  09/20/2011   Procedure: COLONOSCOPY;  Surgeon: Beryle Beams, MD;  Location: [facility] ENDOSCOPY;  Service: Endoscopy;  Laterality: N/A;  . corneal erosion     cataracts   . elevated metatarsal arch    . fallen uterus    . hemmorrhoidectomy    . left total knee replacement  05/02/04   [name]  . met osteostomy    . morton's nueroma    . OVARIAN CYST SURGERY    . RECTOCELE REPAIR    . rupture rt groin    .  salk    . SKIN CANCER EXCISION Right March 2016  2013   Cheek -  [facility]  and  Anterior Neck  . tracheotomy      There were no vitals filed for this visit.       Subjective Assessment - 07/11/16 1441    Subjective Patient sustained a fall about 2-3 weeks ago after the tip of her can perforated the top or a mole tunnel; she has been with subsequent pain in the left lower back.    Pertinent History History of Left TKA (10YA), neck pain, and Rt rotator cuff tear x2 with poor outcome after 9 recent OT visits (NOV 17).    How long can you sit comfortably? Does not limit    How long can you stand comfortably? Standing typically is more limited by chronic TKA, but she has not been out of the house since this problem started due to pain.    How long can you walk comfortably? Unsure.    Diagnostic tests None    Currently in Pain? No/denies  No pain at rest  [facility] PT Assessment - 07/11/16 0001      Assessment   Medical Diagnosis Left sided low back pain s/p fall   Referring Provider Sinda Du   Onset Date/Surgical Date --  about 3 weeks ago    Hand Dominance Right  uses left often due to Rt RC tear and Rt     Next MD Visit none   Prior Therapy none     Precautions   Precautions None     Balance Screen   Has the patient fallen in the past 6 months Yes   How many times? 1  fall in yard   Has the patient had a decrease in activity level because of a fear of falling?  No   Is the patient reluctant to leave their home because of a fear of falling?  No     Home Ec[employer] residence     Prior Function   Level of Independence Independent  performs all IADL independently     Functional Tests   Functional tests Sit to Stand     Sit to Stand   Comments 5xSTS: 25s  more guarding than actual pain     AROM   AROM Assessment Site Lumbar   Lumbar - Right Rotation 33 degrees  seated, arms across lap    Lumbar - Left Rotation 35 degrees   seated, arms across lap                    [facility] Adult PT Treatment/Exercise - 07/11/16 0001      Exercises   Exercises Lumbar     Lumbar Exercises: Stretches   Single Knee to Chest Stretch 3 reps;30 seconds  HEP education     Manual Therapy   Manual Therapy Myofascial release;Passive ROM   Myofascial Release Left Paraspinals and QL: MFR, ART   5 minutes   Passive ROM Lumbaothoracic rotation seated, P/ROM stretching  2x30sec bilat                  PT Short Term Goals - 07/11/16 1541      PT SHORT TERM GOAL #1   Title After 4 weeks patient will report ability to complete 90% of activities at home without limitations from low back pain.    Status New     PT SHORT TERM GOAL #2   Title After 4 weeks patient will demonstrate improved functional movement and strength without guarding and fears inhibition AEB 5xSTS< 16 seconds.    Status New     PT SHORT TERM GOAL #3   Title After 4 weeks patient will demonstrate minimal falls risk in the home AEB BBT score of 48/56 or greater, a self selected gait speed of >1.51ms, and TUG<15sec.    Status New                  Plan - 07/11/16 1528    Clinical Impression Statement Pt presents 2-3 weeks s/p a fall in backyard with sustained left low back pain since. Upon examination, the patient demonstrates firm Left paraspinal muscles between the iliac crest and thoracic cage posteriorly. Deep palpation also reveals involvement of quadratus lumborum. Spinging of ribs 9 and 10 on that side is provoking of pain. pain is absent during static posturing, but sudden movements involving the use of these muscles or abupt changes in posture about the lumbopelvic region results in sudden sharp arresting pain. The patient demonstrates tightness and pain with  lumbothoracic rotatio, but it is difficult to what degreee these deficits are chronic and what is subacute. Overall lumbothoracic rotation A/ROM is limited by >60% from WNL.  The patient moves slowly with fear inhibition and guarding, and gait demonstrating poor foot clearance and foot scuffing bilat, with an increased step cadence just shy of festination. Pt will benefit from skilled PT intervention to address these deficits and to furter address falls risk, to improe safety in home and to restore PLOF in independent performance of IADL.    Rehab Potential Good   PT Frequency 3x / week   PT Duration 4 weeks   PT Treatment/Interventions ADLs/Self Care Home Management;Electrical Stimulation;Moist Heat;DME Instruction;Therapeutic activities;Therapeutic exercise;Balance training;Neuromuscular re-education;Patient/family education;Passive range of motion;Manual techniques;Dry needling   PT Next Visit Plan Review treatment goals, obtain feedback on previous manual therapy treatment as wel as HEP performance and efficiacy, Trial sidelying rotational stretching, continual with mMFR adn moist heat to address back spasms, per, walk test if tolerated, BBT if tolerated.    PT Home Exercise Plan Eval: Left SKTC stretch 3x30sec TID;    Consulted and Agree with Plan of Care Patient      Patient will benefit from skilled therapeutic intervention in order to improve the following deficits and impairments:  Abnormal gait, Decreased coordination, Decreased range of motion, Difficulty walking, Increased fascial restricitons, Impaired tone, Pain, Impaired UE functional use, Decreased knowledge of precautions, Hypomobility, Decreased mobility, Decreased strength, Postural dysfunction, Impaired flexibility, Decreased activity tolerance  Visit Diagnosis: Acute left-sided low back pain without sciatica - Plan: PT plan of care cert/re-cert  Muscle spasm of back - Plan: PT plan of care cert/re-cert  Difficulty in walking, not elsewhere classified - Plan: PT plan of care cert/re-cert      G-Codes - 11/57/26 1543    Functional Assessment Tool Used FOTO: 57 (43% impaired)     Functional  Limitation Mobility: Walking and moving around   Mobility: Walking and Moving Around Current Status [phone]) At least 40 percent but less than 60 percent impaired, limited or restricted   Mobility: Walking and Moving Around Goal Status [phone]) At least 20 percent but less than 40 percent impaired, limited or restricted       Problem List Patient Active Problem List   Diagnosis Date Noted  . Acute blood loss anemia 09/10/2014  . GIB (gastrointestinal bleeding) 09/08/2014  . Occlusion and stenosis of carotid artery without mention of cerebral infarction 12/14/2013  . Osteoarthritis of left knee 04/23/2013  . Thumb pain 10/22/2012  . De Quervain's syndrome (tenosynovitis) 10/22/2012  . [facility] DJD(carpometacarpal degenerative joint disease), localized primary 10/22/2012  . CTS (carpal tunnel syndrome) 10/22/2012  . Trigger thumb of right hand 10/22/2012  . Pes anserinus bursitis of right knee 06/26/2012  . OA (osteoarthritis) of knee 06/26/2012  . Leg pain, left 06/26/2012  . Radicular pain of left lower extremity 06/26/2012  . Diverticulosis of colon with hemorrhage 09/22/2011  . Syncopal episodes 09/20/2011  . GI bleed 09/19/2011  . HTN (hypertension) 09/19/2011  . Anemia due to blood loss, acute 09/19/2011  . Abnormality of gait 01/03/2011  . Personal history of fall 12/27/2010  . DISLOCATION CLOSED SHOULDER NEC 04/25/2010  . ANSERINE BURSITIS, LEFT 02/13/2010  . KNEE PAIN 12/22/2009  . LEG PAIN, BILATERAL 12/22/2009  . TOTAL KNEE FOLLOW-UP 12/22/2009  . HIP PAIN 02/07/2009  . LOW BACK PAIN 02/07/2009    3:50 PM, 07/11/16 Etta Grandchild, PT, DPT Physical Therapist at [place]  [phone] (office)    [place] [address] [place], [place], [address] Phone: [phone]   Fax:  [phone]  Name: Cindy Schultz MRN: [id number] Date of Birth: 08/31/1923

## 2016-07-13 ENCOUNTER — Ambulatory Visit (HOSPITAL_COMMUNITY): Payer: Medicare Other

## 2016-07-13 DIAGNOSIS — M62830 Muscle spasm of back: Secondary | ICD-10-CM | POA: Diagnosis not present

## 2016-07-13 DIAGNOSIS — R262 Difficulty in walking, not elsewhere classified: Secondary | ICD-10-CM | POA: Diagnosis not present

## 2016-07-13 DIAGNOSIS — R29898 Other symptoms and signs involving the musculoskeletal system: Secondary | ICD-10-CM | POA: Diagnosis not present

## 2016-07-13 DIAGNOSIS — M545 Low back pain, unspecified: Secondary | ICD-10-CM

## 2016-07-13 NOTE — Therapy (Signed)
[place], [place], [address] Phone: [phone]   Fax:  [phone]  Physical Therapy Treatment  Patient Details  Name: Cindy Schultz MRN: [id number] Date of Birth: 08-28-1923 Referring Provider: Sinda Du  Encounter Date: 07/13/2016      PT End of Session - 07/13/16 1024    Visit Number 2   Number of Visits 12   Date for PT Re-Evaluation 08/11/16   Authorization Type Medicare Traditional    Authorization Time Period 07/11/16-08/11/16   Authorization - Visit Number 2   Authorization - Number of Visits 10   PT Start Time 0948   PT Stop Time 1026   PT Time Calculation (min) 38 min   Activity Tolerance Patient tolerated treatment well;No increased pain;Patient limited by fatigue   Behavior During Therapy [facility] for tasks assessed/performed      Past Medical History:  Diagnosis Date  . Cancer [facility]) March 2016   Skin Cancer :  Right Cheek     SCC  . Cancer [facility]) 2013   Skin Cancer  :  Anterior Neck   SCC  . Carotid artery occlusion   . Diverticulitis   . DVT (deep venous thrombosis) ([place]) 07/14/10   LEA doppler   . Glaucoma   . Hearing loss   . HTN (hypertension)    cholesterol  . Lumbar spine pain   . Lymphedema   . Macular degeneration   . Pain in joints   . Precancerous changes of the cervix    lesions  . Rectocele   . SOB (shortness of breath) 04/04/2010   2D echo EF=>55%    Past Surgical History:  Procedure Laterality Date  . ABDOMINAL HYSTERECTOMY    . appendix    . bladder tac    . COLONOSCOPY  09/20/2011   Procedure: COLONOSCOPY;  Surgeon: Beryle Beams, MD;  Location: [facility] ENDOSCOPY;  Service: Endoscopy;  Laterality: N/A;  . corneal erosion     cataracts   . elevated metatarsal arch    . fallen uterus    . hemmorrhoidectomy    . left total knee replacement  05/02/04   [name]  . met osteostomy    . morton's nueroma    . OVARIAN CYST SURGERY    . RECTOCELE REPAIR    .  rupture rt groin    . salk    . SKIN CANCER EXCISION Right March 2016  2013   Cheek -  [facility]  and  Anterior Neck  . tracheotomy      There were no vitals filed for this visit.      Subjective Assessment - 07/13/16 0952    Subjective Pt reports she feels ok today, but is unable to offer any specifics of her symptoms being better or worse. She reported thinks it does feel better, but thinks its due to continued use of moist heat.    Pertinent History History of Left TKA (10YA), neck pain, and Rt rotator cuff tear x2 with poor outcome after 9 recent OT visits (NOV 17).    Currently in Pain? No/denies   Pain Score --  no resting pain, just intermittent sharp sudden pain which she is unable to rate.            [facility] PT Assessment - 07/13/16 0001      AROM   Lumbar - Right Rotation 68 degrees  33 degrees on 1/26   Lumbar - Left Rotation 67 degrees  35 degrees on 1/26     Balance   Balance Assessed Yes     Standardized Balance Assessment   Standardized Balance Assessment Berg Balance Test     Berg Balance Test   Sit to Stand Able to stand without using hands and stabilize independently   Standing Unsupported Able to stand safely 2 minutes   Sitting with Back Unsupported but Feet Supported on Floor or Stool Able to sit safely and securely 2 minutes   Stand to Sit Sits safely with minimal use of hands   Transfers Able to transfer safely, minor use of hands   Standing Unsupported with Eyes Closed Able to stand 10 seconds with supervision   Standing Ubsupported with Feet Together Needs help to attain position but able to stand for 30 seconds with feet together   From Standing, Reach Forward with Outstretched Arm Can reach forward >5 cm safely (2")  RUE no used due to shoulder pathology   From Standing Position, Pick up Object from Floor Able to pick up shoe, needs supervision   From Standing Position, Turn to Look Behind Over each Shoulder Turn sideways only but maintains balance    Turn 360 Degrees Needs close supervision or verbal cueing  8-10 seconds bilat   Standing Unsupported, Alternately Place Feet on Step/Stool Able to complete >2 steps/needs minimal assist   Standing Unsupported, One Foot in Front Able to take small step independently and hold 30 seconds   Standing on One Leg Tries to lift leg/unable to hold 3 seconds but remains standing independently   Total Score 36                     [facility] Adult PT Treatment/Exercise - 07/13/16 0001      Lumbar Exercises: Stretches   Single Knee to Chest Stretch 3 reps;30 seconds   Double Knee to Chest Stretch 3 reps;30 seconds  seated floor touch   Lower Trunk Rotation 3 reps;30 seconds  Chair Rotation Stretch: 3x30sec bilat     Lumbar Exercises: Seated   Sit to Stand 10 reps  hand on thighs   Sit to Stand Limitations Repeated Trunk Extension seated: x10 MinA/CGA for weakness/safety     Manual Therapy   Manual Therapy Myofascial release;Passive ROM   Manual therapy comments Instrument Assisted Soft tissue mobilization:   3x30sec during DKTC stretching   Myofascial Release Left Paraspinals and QL: MFR, ART   5 minutes                PT Education - 07/13/16 1024    Education provided Yes   Education Details Educated on BBT scor eof 36/56 and falls risk.    Person(s) Educated Patient   Methods Explanation   Comprehension Verbalized understanding          PT Short Term Goals - 07/11/16 1541      PT SHORT TERM GOAL #1   Title After 4 weeks patient will report ability to complete 90% of activities at home without limitations from low back pain.    Status New     PT SHORT TERM GOAL #2   Title After 4 weeks patient will demonstrate improved functional movement and strength without guarding and fears inhibition AEB 5xSTS< 16 seconds.    Status New     PT SHORT TERM GOAL #3   Title After 4 weeks patient will demonstrate minimal falls risk in the home AEB BBT score of 48/56 or  greater, a self selected  gait speed of >1.71ms, and TUG<15sec.    Status New                  Plan - 07/13/16 1025    Clinical Impression Statement Reviewed goals and eval with patient tis session. Noted decrease in muscle spasm in back today, with improved P/ROM in lumbar rotation, and improved funcitonal movemment with decreased guarding. Berg Balance test is issued noting a score or 36/56, and this was explained to the patient to identify falls risk. Strengthening of lumbar spine also started today, requiring minA, but perrformed without exacerbation of pain.    Rehab Potential Good   PT Frequency 3x / week   PT Duration 4 weeks   PT Treatment/Interventions ADLs/Self Care Home Management;Electrical Stimulation;Moist Heat;DME Instruction;Therapeutic activities;Therapeutic exercise;Balance training;Neuromuscular re-education;Patient/family education;Passive range of motion;Manual techniques;Dry needling   PT Next Visit Plan Review treatment goals, obtain feedback on previous manual therapy treatment as wel as HEP performance and efficiacy, Trial sidelying rotational stretching, continual with mMFR adn moist heat to address back spasms, per, walk test if tolerated, BBT if tolerated.    PT Home Exercise Plan Eval: Left SKTC stretch 3x30sec TID;    Consulted and Agree with Plan of Care Patient      Patient will benefit from skilled therapeutic intervention in order to improve the following deficits and impairments:  Abnormal gait, Decreased coordination, Decreased range of motion, Difficulty walking, Increased fascial restricitons, Impaired tone, Pain, Impaired UE functional use, Decreased knowledge of precautions, Hypomobility, Decreased mobility, Decreased strength, Postural dysfunction, Impaired flexibility, Decreased activity tolerance  Visit Diagnosis: Acute left-sided low back pain without sciatica  Muscle spasm of back  Difficulty in walking, not elsewhere  classified     Problem List Patient Active Problem List   Diagnosis Date Noted  . Acute blood loss anemia 09/10/2014  . GIB (gastrointestinal bleeding) 09/08/2014  . Occlusion and stenosis of carotid artery without mention of cerebral infarction 12/14/2013  . Osteoarthritis of left knee 04/23/2013  . Thumb pain 10/22/2012  . De Quervain's syndrome (tenosynovitis) 10/22/2012  . [facility]carpomet[facility] disease), localized primary 10/22/2012  . CTS (carpal tunnel syndrome) 10/22/2012  . Trigger thumb of right hand 10/22/2012  . Pes anserinus bursitis of right knee 06/26/2012  . OA (osteoarthritis) of knee 06/26/2012  . Leg pain, left 06/26/2012  . Radicular pain of left lower extremity 06/26/2012  . Diverticulosis of colon with hemorrhage 09/22/2011  . Syncopal episodes 09/20/2011  . GI bleed 09/19/2011  . HTN (hypertension) 09/19/2011  . Anemia due to blood loss, acute 09/19/2011  . Abnormality of gait 01/03/2011  . Personal history of fall 12/27/2010  . DISLOCATION CLOSED SHOULDER NEC 04/25/2010  . ANSERINE BURSITIS, LEFT 02/13/2010  . KNEE PAIN 12/22/2009  . LEG PAIN, BILATERAL 12/22/2009  . TOTAL KNEE FOLLOW-UP 12/22/2009  . HIP PAIN 02/07/2009  . LOW BACK PAIN 02/07/2009    10:34 AM, 07/13/16 AEtta Grandchild PT, DPT [facility] [phone](office)     [place] [address]Phone: [phone]  Fax:  [phone] Name: Cindy STRUBELMRN: [id number] of Birth: [birth year]

## 2016-07-16 ENCOUNTER — Ambulatory Visit (HOSPITAL_COMMUNITY): Payer: Medicare Other | Admitting: Physical Therapy

## 2016-07-16 DIAGNOSIS — R262 Difficulty in walking, not elsewhere classified: Secondary | ICD-10-CM

## 2016-07-16 DIAGNOSIS — M545 Low back pain, unspecified: Secondary | ICD-10-CM

## 2016-07-16 DIAGNOSIS — M62830 Muscle spasm of back: Secondary | ICD-10-CM | POA: Diagnosis not present

## 2016-07-16 DIAGNOSIS — R29898 Other symptoms and signs involving the musculoskeletal system: Secondary | ICD-10-CM | POA: Diagnosis not present

## 2016-07-16 NOTE — Therapy (Signed)
[place], [place], [address] Phone: [phone]   Fax:  [phone]  Physical Therapy Treatment  Patient Details  Name: Cindy Schultz MRN: [id number] Date of Birth: 1924-06-01 Referring Provider: Sinda Du  Encounter Date: 07/16/2016      PT End of Session - 07/16/16 9509    Visit Number 3   Number of Visits 12   Date for PT Re-Evaluation 08/11/16   Authorization Type Medicare Traditional    Authorization Time Period 07/11/16-08/11/16   Authorization - Visit Number 3   Authorization - Number of Visits 10   PT Start Time 3267   PT Stop Time 1245   PT Time Calculation (min) 39 min   Activity Tolerance Patient tolerated treatment well;No increased pain;Patient limited by fatigue   Behavior During Therapy [facility] for tasks assessed/performed      Past Medical History:  Diagnosis Date  . Cancer [facility]) March 2016   Skin Cancer :  Right Cheek     SCC  . Cancer [facility]) 2013   Skin Cancer  :  Anterior Neck   SCC  . Carotid artery occlusion   . Diverticulitis   . DVT (deep venous thrombosis) ([place]) 07/14/10   LEA doppler   . Glaucoma   . Hearing loss   . HTN (hypertension)    cholesterol  . Lumbar spine pain   . Lymphedema   . Macular degeneration   . Pain in joints   . Precancerous changes of the cervix    lesions  . Rectocele   . SOB (shortness of breath) 04/04/2010   2D echo EF=>55%    Past Surgical History:  Procedure Laterality Date  . ABDOMINAL HYSTERECTOMY    . appendix    . bladder tac    . COLONOSCOPY  09/20/2011   Procedure: COLONOSCOPY;  Surgeon: Beryle Beams, MD;  Location: [facility] ENDOSCOPY;  Service: Endoscopy;  Laterality: N/A;  . corneal erosion     cataracts   . elevated metatarsal arch    . fallen uterus    . hemmorrhoidectomy    . left total knee replacement  05/02/04   [name]  . met osteostomy    . morton's nueroma    . OVARIAN CYST SURGERY    . RECTOCELE REPAIR    .  rupture rt groin    . salk    . SKIN CANCER EXCISION Right March 2016  2013   Cheek -  [facility]  and  Anterior Neck  . tracheotomy      There were no vitals filed for this visit.      Subjective Assessment - 07/16/16 1437    Subjective Pt reports that she woke up today a little more sore than normal. She was up all night using the rest room and noticed she was sore. She is unable to report if this continued and reports no issues currently.    Pertinent History History of Left TKA (10YA), neck pain, and Rt rotator cuff tear x2 with poor outcome after 9 recent OT visits (NOV 17).    Currently in Pain? No/denies                         [facility] Adult PT Treatment/Exercise - 07/16/16 0001      Exercises   Exercises Other Exercises   Other Exercises  seated trunk rotation      Lumbar Exercises: Stretches   Single Knee  to Chest Stretch 5 reps;10 seconds     Lumbar Exercises: Seated   Sit to Stand 10 reps   Sit to Stand Limitations no UE support     Lumbar Exercises: Supine   Other Supine Lumbar Exercises low trunk rotation x20 reps              Balance Exercises - 07/16/16 1507      Balance Exercises: Standing   Standing Eyes Opened Narrow base of support (BOS);Foam/compliant surface;2 reps;30 secs   Standing Eyes Closed Narrow base of support (BOS);Solid surface;2 reps;30 secs   Tandem Stance Eyes open;2 reps;30 secs   SLS Eyes open;Solid surface;2 reps;30 secs  LE propped on 12" box            PT Education - 07/16/16 1515    Education Details discussed pt's fear of falling and unsteady gait, and encouraged her to use SPC in the community until her balance improves    Person(s) Educated Patient   Methods Explanation   Comprehension Verbalized understanding          PT Short Term Goals - 07/11/16 1541      PT SHORT TERM GOAL #1   Title After 4 weeks patient will report ability to complete 90% of activities at home without limitations from low back  pain.    Status New     PT SHORT TERM GOAL #2   Title After 4 weeks patient will demonstrate improved functional movement and strength without guarding and fears inhibition AEB 5xSTS< 16 seconds.    Status New     PT SHORT TERM GOAL #3   Title After 4 weeks patient will demonstrate minimal falls risk in the home AEB BBT score of 48/56 or greater, a self selected gait speed of >1.27ms, and TUG<15sec.    Status New                  Plan - 07/16/16 1516    Clinical Impression Statement Today's session continued with lumbar flexibility activity followed by balance activity secondary her poor score on the berg balance test. Pt demonstrating anxiety and fearfulness during several balance exercises and reporting increased fear of falling since her fall a while back. Therapist discussed using her SElginto improve her steadiness and confidence with daily activity however this will need to be further addressed secondary to concerns with pt understanding.    Rehab Potential Good   PT Frequency 3x / week   PT Duration 4 weeks   PT Treatment/Interventions ADLs/Self Care Home Management;Electrical Stimulation;Moist Heat;DME Instruction;Therapeutic activities;Therapeutic exercise;Balance training;Neuromuscular re-education;Patient/family education;Passive range of motion;Manual techniques;Dry needling   PT Next Visit Plan balance activity, discuss using SPC for improved safety; continue with MFR and moist heat as needed to address back spasm.    PT Home Exercise Plan Eval: Left SKTC stretch 3x30sec TID;    Consulted and Agree with Plan of Care Patient      Patient will benefit from skilled therapeutic intervention in order to improve the following deficits and impairments:  Abnormal gait, Decreased coordination, Decreased range of motion, Difficulty walking, Increased fascial restricitons, Impaired tone, Pain, Impaired UE functional use, Decreased knowledge of precautions, Hypomobility, Decreased  mobility, Decreased strength, Postural dysfunction, Impaired flexibility, Decreased activity tolerance  Visit Diagnosis: Acute left-sided low back pain without sciatica  Muscle spasm of back  Difficulty in walking, not elsewhere classified     Problem List Patient Active Problem List   Diagnosis Date Noted  . Acute  blood loss anemia 09/10/2014  . GIB (gastrointestinal bleeding) 09/08/2014  . Occlusion and stenosis of carotid artery without mention of cerebral infarction 12/14/2013  . Osteoarthritis of left knee 04/23/2013  . Thumb pain 10/22/2012  . De Quervain's syndrome (tenosynovitis) 10/22/2012  . [facility] DJD(carpometacarpal degenerative joint disease), localized primary 10/22/2012  . CTS (carpal tunnel syndrome) 10/22/2012  . Trigger thumb of right hand 10/22/2012  . Pes anserinus bursitis of right knee 06/26/2012  . OA (osteoarthritis) of knee 06/26/2012  . Leg pain, left 06/26/2012  . Radicular pain of left lower extremity 06/26/2012  . Diverticulosis of colon with hemorrhage 09/22/2011  . Syncopal episodes 09/20/2011  . GI bleed 09/19/2011  . HTN (hypertension) 09/19/2011  . Anemia due to blood loss, acute 09/19/2011  . Abnormality of gait 01/03/2011  . Personal history of fall 12/27/2010  . DISLOCATION CLOSED SHOULDER NEC 04/25/2010  . ANSERINE BURSITIS, LEFT 02/13/2010  . KNEE PAIN 12/22/2009  . LEG PAIN, BILATERAL 12/22/2009  . TOTAL KNEE FOLLOW-UP 12/22/2009  . HIP PAIN 02/07/2009  . LOW BACK PAIN 02/07/2009   3:23 PM,07/16/16 Elly Modena PT, DPT [facility] [place] [address] [place], [place], [address] Phone: [phone]   Fax:  [phone]  Name: Cindy Schultz MRN: [id number] Date of Birth: 11-11-1923

## 2016-07-18 ENCOUNTER — Ambulatory Visit (HOSPITAL_COMMUNITY): Payer: Medicare Other

## 2016-07-18 DIAGNOSIS — M545 Low back pain, unspecified: Secondary | ICD-10-CM

## 2016-07-18 DIAGNOSIS — R262 Difficulty in walking, not elsewhere classified: Secondary | ICD-10-CM | POA: Diagnosis not present

## 2016-07-18 DIAGNOSIS — R29898 Other symptoms and signs involving the musculoskeletal system: Secondary | ICD-10-CM | POA: Diagnosis not present

## 2016-07-18 DIAGNOSIS — M62830 Muscle spasm of back: Secondary | ICD-10-CM

## 2016-07-18 NOTE — Therapy (Signed)
[place], [place], [address] Phone: [phone]   Fax:  [phone]  Physical Therapy Treatment  Patient Details  Name: Cindy Schultz MRN: [id number] Date of Birth: [birth year] Referring Provider: Sinda Du  Encounter Date: 07/18/2016      PT End of Session - 07/18/16 0923    Visit Number 4   Number of Visits 12   Date for PT Re-Evaluation 08/11/16   Authorization Type Medicare Traditional    Authorization Time Period 07/11/16-08/11/16   Authorization - Visit Number 4   Authorization - Number of Visits 10   PT Start Time 0913   PT Stop Time 0949   PT Time Calculation (min) 36 min   Equipment Utilized During Treatment Gait belt   Activity Tolerance Patient tolerated treatment well;No increased pain;Patient limited by fatigue   Behavior During Therapy [facility] for tasks assessed/performed      Past Medical History:  Diagnosis Date  . Cancer [facility]) March 2016   Skin Cancer :  Right Cheek     SCC  . Cancer [facility]) 2013   Skin Cancer  :  Anterior Neck   SCC  . Carotid artery occlusion   . Diverticulitis   . DVT (deep venous thrombosis) ([place]) 07/14/10   LEA doppler   . Glaucoma   . Hearing loss   . HTN (hypertension)    cholesterol  . Lumbar spine pain   . Lymphedema   . Macular degeneration   . Pain in joints   . Precancerous changes of the cervix    lesions  . Rectocele   . SOB (shortness of breath) 04/04/2010   2D echo EF=>55%    Past Surgical History:  Procedure Laterality Date  . ABDOMINAL HYSTERECTOMY    . appendix    . bladder tac    . COLONOSCOPY  09/20/2011   Procedure: COLONOSCOPY;  Surgeon: Beryle Beams, MD;  Location: [facility] ENDOSCOPY;  Service: Endoscopy;  Laterality: N/A;  . corneal erosion     cataracts   . elevated metatarsal arch    . fallen uterus    . hemmorrhoidectomy    . left total knee replacement  05/02/04   [name]  . met osteostomy    . morton's nueroma    .  OVARIAN CYST SURGERY    . RECTOCELE REPAIR    . rupture rt groin    . salk    . SKIN CANCER EXCISION Right March 2016  2013   Cheek -  [facility]  and  Anterior Neck  . tracheotomy      There were no vitals filed for this visit.      Subjective Assessment - 07/18/16 0914    Subjective Pt reports she is feeling good today, no reports of recent falls.     Pertinent History History of Left TKA (10YA), neck pain, and Rt rotator cuff tear x2 with poor outcome after 9 recent OT visits (NOV 17).    Currently in Pain? No/denies                         [facility] Adult PT Treatment/Exercise - 07/18/16 0001      Ambulation/Gait   Ambulation/Gait Yes   Ambulation/Gait Assistance 4: Min assist   Ambulation/Gait Assistance Details --   Ambulation Distance (Feet) 452 Feet   Assistive device Straight cane   Gait Comments Cueing to increased stride length and posture to improve gait  mechanics     Lumbar Exercises: Stretches   Single Knee to Chest Stretch 5 reps;10 seconds     Lumbar Exercises: Seated   Sit to Stand 10 reps   Sit to Stand Limitations no UE support     Lumbar Exercises: Supine   Bridge 15 reps   Other Supine Lumbar Exercises low trunk rotation x20 reps              Balance Exercises - 07/18/16 0947      Balance Exercises: Standing   Tandem Stance Eyes open;2 reps;30 secs   SLS Eyes open;Solid surface;2 reps;30 secs  foot on 12in step with no HHA   Other Standing Exercises big steps inside // bars 2RT             PT Short Term Goals - 07/11/16 1541      PT SHORT TERM GOAL #1   Title After 4 weeks patient will report ability to complete 90% of activities at home without limitations from low back pain.    Status New     PT SHORT TERM GOAL #2   Title After 4 weeks patient will demonstrate improved functional movement and strength without guarding and fears inhibition AEB 5xSTS< 16 seconds.    Status New     PT SHORT TERM GOAL #3   Title After 4  weeks patient will demonstrate minimal falls risk in the home AEB BBT score of 48/56 or greater, a self selected gait speed of >1.66ms, and TUG<15sec.    Status New                  Plan - 07/18/16 1351    Clinical Impression Statement Pt arrived without AD and entered session with shuffled gait mechanics.  Pt educated on importance of utilizing AD to reduce risk of fall and improve confidence with gait.  Pt stated she was walking with the cane when she had her last fall and feels cane influenced the fall, pt educated on safety wiht use of SPC and encouraged to begin ambulating with.  Began session with therex to improve lumbar flexibility and progress to balance and gait training.  Pt demonstrated anxiety and fearfulness during balance activities requesting HHA, min A was provided to reduce anxiety and safety to reduce fall.   Rehab Potential Good   PT Frequency 3x / week   PT Duration 4 weeks   PT Treatment/Interventions ADLs/Self Care Home Management;Electrical Stimulation;Moist Heat;DME Instruction;Therapeutic activities;Therapeutic exercise;Balance training;Neuromuscular re-education;Patient/family education;Passive range of motion;Manual techniques;Dry needling   PT Next Visit Plan balance activity, discuss using SPC for improved safety; continue with MFR and moist heat as needed to address back spasm.       Patient will benefit from skilled therapeutic intervention in order to improve the following deficits and impairments:  Abnormal gait, Decreased coordination, Decreased range of motion, Difficulty walking, Increased fascial restricitons, Impaired tone, Pain, Impaired UE functional use, Decreased knowledge of precautions, Hypomobility, Decreased mobility, Decreased strength, Postural dysfunction, Impaired flexibility, Decreased activity tolerance  Visit Diagnosis: Acute left-sided low back pain without sciatica  Muscle spasm of back  Difficulty in walking, not elsewhere  classified  Other symptoms and signs involving the musculoskeletal system     Problem List Patient Active Problem List   Diagnosis Date Noted  . Acute blood loss anemia 09/10/2014  . GIB (gastrointestinal bleeding) 09/08/2014  . Occlusion and stenosis of carotid artery without mention of cerebral infarction 12/14/2013  . Osteoarthritis of left knee 04/23/2013  .  Thumb pain 10/22/2012  . De Quervain's syndrome (tenosynovitis) 10/22/2012  . [facility] DJD(carpometacarpal degenerative joint disease), localized primary 10/22/2012  . CTS (carpal tunnel syndrome) 10/22/2012  . Trigger thumb of right hand 10/22/2012  . Pes anserinus bursitis of right knee 06/26/2012  . OA (osteoarthritis) of knee 06/26/2012  . Leg pain, left 06/26/2012  . Radicular pain of left lower extremity 06/26/2012  . Diverticulosis of colon with hemorrhage 09/22/2011  . Syncopal episodes 09/20/2011  . GI bleed 09/19/2011  . HTN (hypertension) 09/19/2011  . Anemia due to blood loss, acute 09/19/2011  . Abnormality of gait 01/03/2011  . Personal history of fall 12/27/2010  . DISLOCATION CLOSED SHOULDER NEC 04/25/2010  . ANSERINE BURSITIS, LEFT 02/13/2010  . KNEE PAIN 12/22/2009  . LEG PAIN, BILATERAL 12/22/2009  . TOTAL KNEE FOLLOW-UP 12/22/2009  . HIP PAIN 02/07/2009  . LOW BACK PAIN 02/07/2009   Ihor Austin, [place]; [place]  Aldona Lento 07/18/2016, 2:01 PM  [place] [address] [place], [place], [address] Phone: [phone]   Fax:  [phone]  Name: Cindy Schultz MRN: [id number] Date of Birth: [birth year]

## 2016-07-20 ENCOUNTER — Ambulatory Visit (HOSPITAL_COMMUNITY): Payer: Medicare Other | Attending: Orthopedic Surgery

## 2016-07-20 DIAGNOSIS — R29898 Other symptoms and signs involving the musculoskeletal system: Secondary | ICD-10-CM

## 2016-07-20 DIAGNOSIS — R262 Difficulty in walking, not elsewhere classified: Secondary | ICD-10-CM | POA: Insufficient documentation

## 2016-07-20 DIAGNOSIS — M62830 Muscle spasm of back: Secondary | ICD-10-CM

## 2016-07-20 DIAGNOSIS — M545 Low back pain, unspecified: Secondary | ICD-10-CM

## 2016-07-20 NOTE — Therapy (Signed)
[place], [place], [address] Phone: [phone]   Fax:  [phone]  Physical Therapy Treatment  Patient Details  Name: Cindy Schultz MRN: [id number] Date of Birth: 09-Feb-1924 Referring Provider: Sinda Du  Encounter Date: 07/20/2016      PT End of Session - 07/20/16 1356    Visit Number 5   Number of Visits 12   Date for PT Re-Evaluation 08/11/16   Authorization Type Medicare Traditional    Authorization Time Period 07/11/16-08/11/16   Authorization - Visit Number 5   Authorization - Number of Visits 10   PT Start Time 8546  Restroom break until 2703   PT Stop Time 1432   PT Time Calculation (min) 42 min   Equipment Utilized During Treatment Gait belt   Activity Tolerance Patient tolerated treatment well;No increased pain;Patient limited by fatigue   Behavior During Therapy [facility] for tasks assessed/performed      Past Medical History:  Diagnosis Date  . Cancer [facility]) March 2016   Skin Cancer :  Right Cheek     SCC  . Cancer [facility]) 2013   Skin Cancer  :  Anterior Neck   SCC  . Carotid artery occlusion   . Diverticulitis   . DVT (deep venous thrombosis) ([place]) 07/14/10   LEA doppler   . Glaucoma   . Hearing loss   . HTN (hypertension)    cholesterol  . Lumbar spine pain   . Lymphedema   . Macular degeneration   . Pain in joints   . Precancerous changes of the cervix    lesions  . Rectocele   . SOB (shortness of breath) 04/04/2010   2D echo EF=>55%    Past Surgical History:  Procedure Laterality Date  . ABDOMINAL HYSTERECTOMY    . appendix    . bladder tac    . COLONOSCOPY  09/20/2011   Procedure: COLONOSCOPY;  Surgeon: Beryle Beams, MD;  Location: [facility] ENDOSCOPY;  Service: Endoscopy;  Laterality: N/A;  . corneal erosion     cataracts   . elevated metatarsal arch    . fallen uterus    . hemmorrhoidectomy    . left total knee replacement  05/02/04   [name]  . met osteostomy    .  morton's nueroma    . OVARIAN CYST SURGERY    . RECTOCELE REPAIR    . rupture rt groin    . salk    . SKIN CANCER EXCISION Right March 2016  2013   Cheek -  [facility]  and  Anterior Neck  . tracheotomy      There were no vitals filed for this visit.      Subjective Assessment - 07/20/16 1352    Subjective Pt stated she is feeling good today, no reports of pain or recent falls.     Pertinent History History of Left TKA (10YA), neck pain, and Rt rotator cuff tear x2 with poor outcome after 9 recent OT visits (NOV 17).    Diagnostic tests None    Currently in Pain? No/denies                         [facility] Adult PT Treatment/Exercise - 07/20/16 0001      Ambulation/Gait   Ambulation/Gait Yes   Ambulation/Gait Assistance 4: Min assist   Ambulation Distance (Feet) 452 Feet   Assistive device Rolling walker   Gait Pattern Decreased stride length;Decreased  dorsiflexion - right;Decreased dorsiflexion - left;Shuffle;Narrow base of support;Trunk flexed   Gait Comments Cueing for increased stride length and heel to toe mechanics to reduce shuffling     Lumbar Exercises: Seated   Sit to Stand 10 reps   Sit to Stand Limitations no UE support     Lumbar Exercises: Supine   Bridge 15 reps   Other Supine Lumbar Exercises low trunk rotation x20 reps              Balance Exercises - 07/20/16 1424      Balance Exercises: Standing   Standing Eyes Opened Narrow base of support (BOS);Foam/compliant surface;2 reps;30 secs   Tandem Stance Eyes open;2 reps;30 secs;Foam/compliant surface;1 rep   SLS Eyes open;Solid surface;2 reps;30 secs  LE on 12in step           PT Education - 07/20/16 1902    Education provided Yes   Education Details Discussed importance of utilizing AD, pt.'s fear of falls and unsteady gait, encouraged to use RW as fearful with SPC until her balance improves    Person(s) Educated Patient   Methods Explanation   Comprehension Verbalized  understanding          PT Short Term Goals - 07/11/16 1541      PT SHORT TERM GOAL #1   Title After 4 weeks patient will report ability to complete 90% of activities at home without limitations from low back pain.    Status New     PT SHORT TERM GOAL #2   Title After 4 weeks patient will demonstrate improved functional movement and strength without guarding and fears inhibition AEB 5xSTS< 16 seconds.    Status New     PT SHORT TERM GOAL #3   Title After 4 weeks patient will demonstrate minimal falls risk in the home AEB BBT score of 48/56 or greater, a self selected gait speed of >1.107ms, and TUG<15sec.    Status New                  Plan - 07/20/16 1406    Clinical Impression Statement Pt entered session ambulating without AD and presents with shuffled gait mechanics.  Continued education on importance of utiilizing AD to reduce risk of fall.  Pt continues to be fearful with use of cane as she feels influenced the fall.  Began gait training with RW with vast improvements with gait mechanics including increased stride length and reduced shuffling following verbal cueing for heel to toe mechanics.  Pt stated she was given a walker from family members, pt encouraged to use and bring to next apt for appropriate sizing.  Min A required for balance activiteis to reduce risk of fall.  No reports of pain through session, pt was limited by fatigue.     Rehab Potential Good   PT Frequency 3x / week   PT Duration 4 weeks   PT Treatment/Interventions ADLs/Self Care Home Management;Electrical Stimulation;Moist Heat;DME Instruction;Therapeutic activities;Therapeutic exercise;Balance training;Neuromuscular re-education;Patient/family education;Passive range of motion;Manual techniques;Dry needling   PT Next Visit Plan balance activity, discuss using SPC/RW for improved safety; continue with MFR and moist heat as needed to address back spasm.       Patient will benefit from skilled  therapeutic intervention in order to improve the following deficits and impairments:  Abnormal gait, Decreased coordination, Decreased range of motion, Difficulty walking, Increased fascial restricitons, Impaired tone, Pain, Impaired UE functional use, Decreased knowledge of precautions, Hypomobility, Decreased mobility, Decreased strength, Postural dysfunction,  Impaired flexibility, Decreased activity tolerance  Visit Diagnosis: Acute left-sided low back pain without sciatica  Muscle spasm of back  Difficulty in walking, not elsewhere classified  Other symptoms and signs involving the musculoskeletal system     Problem List Patient Active Problem List   Diagnosis Date Noted  . Acute blood loss anemia 09/10/2014  . GIB (gastrointestinal bleeding) 09/08/2014  . Occlusion and stenosis of carotid artery without mention of cerebral infarction 12/14/2013  . Osteoarthritis of left knee 04/23/2013  . Thumb pain 10/22/2012  . De Quervain's syndrome (tenosynovitis) 10/22/2012  . [facility] DJD(carpometacarpal degenerative joint disease), localized primary 10/22/2012  . CTS (carpal tunnel syndrome) 10/22/2012  . Trigger thumb of right hand 10/22/2012  . Pes anserinus bursitis of right knee 06/26/2012  . OA (osteoarthritis) of knee 06/26/2012  . Leg pain, left 06/26/2012  . Radicular pain of left lower extremity 06/26/2012  . Diverticulosis of colon with hemorrhage 09/22/2011  . Syncopal episodes 09/20/2011  . GI bleed 09/19/2011  . HTN (hypertension) 09/19/2011  . Anemia due to blood loss, acute 09/19/2011  . Abnormality of gait 01/03/2011  . Personal history of fall 12/27/2010  . DISLOCATION CLOSED SHOULDER NEC 04/25/2010  . ANSERINE BURSITIS, LEFT 02/13/2010  . KNEE PAIN 12/22/2009  . LEG PAIN, BILATERAL 12/22/2009  . TOTAL KNEE FOLLOW-UP 12/22/2009  . HIP PAIN 02/07/2009  . LOW BACK PAIN 02/07/2009   Ihor Austin, [place]; [place]  Aldona Lento 07/20/2016, 7:05  PM  [place] [address] [place], [place], [address] Phone: [phone]   Fax:  [phone]  Name: Cindy Schultz MRN: [id number] Date of Birth: 04-23-1924

## 2016-07-23 ENCOUNTER — Ambulatory Visit (HOSPITAL_COMMUNITY): Payer: Medicare Other | Admitting: Physical Therapy

## 2016-07-23 DIAGNOSIS — R262 Difficulty in walking, not elsewhere classified: Secondary | ICD-10-CM | POA: Diagnosis not present

## 2016-07-23 DIAGNOSIS — M545 Low back pain, unspecified: Secondary | ICD-10-CM

## 2016-07-23 DIAGNOSIS — R29898 Other symptoms and signs involving the musculoskeletal system: Secondary | ICD-10-CM | POA: Diagnosis not present

## 2016-07-23 DIAGNOSIS — M62830 Muscle spasm of back: Secondary | ICD-10-CM | POA: Diagnosis not present

## 2016-07-23 NOTE — Therapy (Signed)
[place], [place], [address] Phone: [phone]   Fax:  [phone]  Physical Therapy Treatment  Patient Details  Name: Cindy Schultz MRN: [id number] Date of Birth: 1923/07/27 Referring Provider: Sinda Du  Encounter Date: 07/23/2016      PT End of Session - 07/23/16 1455    Visit Number 6   Number of Visits 12   Date for PT Re-Evaluation 08/11/16   Authorization Type Medicare Traditional    Authorization Time Period 07/11/16-08/11/16   Authorization - Visit Number 6   Authorization - Number of Visits 10   PT Start Time 0093   PT Stop Time 1516   PT Time Calculation (min) 38 min   Equipment Utilized During Treatment Gait belt   Activity Tolerance Patient tolerated treatment well;No increased pain;Patient limited by fatigue   Behavior During Therapy [facility] for tasks assessed/performed      Past Medical History:  Diagnosis Date  . Cancer [facility]) March 2016   Skin Cancer :  Right Cheek     SCC  . Cancer [facility]) 2013   Skin Cancer  :  Anterior Neck   SCC  . Carotid artery occlusion   . Diverticulitis   . DVT (deep venous thrombosis) ([place]) 07/14/10   LEA doppler   . Glaucoma   . Hearing loss   . HTN (hypertension)    cholesterol  . Lumbar spine pain   . Lymphedema   . Macular degeneration   . Pain in joints   . Precancerous changes of the cervix    lesions  . Rectocele   . SOB (shortness of breath) 04/04/2010   2D echo EF=>55%    Past Surgical History:  Procedure Laterality Date  . ABDOMINAL HYSTERECTOMY    . appendix    . bladder tac    . COLONOSCOPY  09/20/2011   Procedure: COLONOSCOPY;  Surgeon: Beryle Beams, MD;  Location: [facility] ENDOSCOPY;  Service: Endoscopy;  Laterality: N/A;  . corneal erosion     cataracts   . elevated metatarsal arch    . fallen uterus    . hemmorrhoidectomy    . left total knee replacement  05/02/04   [name]  . met osteostomy    . morton's nueroma    .  OVARIAN CYST SURGERY    . RECTOCELE REPAIR    . rupture rt groin    . salk    . SKIN CANCER EXCISION Right March 2016  2013   Cheek -  [facility]  and  Anterior Neck  . tracheotomy      There were no vitals filed for this visit.      Subjective Assessment - 07/23/16 1439    Subjective Pt states things are well. She continues to use a heating pad on her back which makes it feel good. She brought her [facility] with her today at the request of the last therapist.    Pertinent History History of Left TKA (10YA), neck pain, and Rt rotator cuff tear x2 with poor outcome after 9 recent OT visits (NOV 17).    Diagnostic tests None    Currently in Pain? No/denies                         [facility] Adult PT Treatment/Exercise - 07/23/16 0001      Exercises   Other Exercises  seated trunk rotation stretch 5x10 sec each  Lumbar Exercises: Seated   Sit to Stand 10 reps   Sit to Stand Limitations no UE      Lumbar Exercises: Supine   Other Supine Lumbar Exercises low trunk rotation x10 reps each side              Balance Exercises - 07/23/16 1456      Balance Exercises: Standing   Standing Eyes Opened Narrow base of support (BOS);Foam/compliant surface;2 reps;30 secs;Solid surface   Tandem Stance Eyes open;2 reps;30 secs   SLS Eyes open;2 reps;30 secs;Solid surface;Other reps (comment)  contralateral LE propped on 12" box    Other Standing Exercises NBOS on firm surface with trunk rotation x5 reps; NBOS on foam surface with trunk rotation Lt/Rt x10 reps each            PT Education - 07/23/16 1520    Education provided Yes   Education Details encouraged pt to have someone assist her with bringing her RW to her next visit so we can adjust to her height; importance of using an AD to improve balance and decrease risk of falling    Person(s) Educated Patient   Methods Explanation   Comprehension Verbalized understanding          PT Short Term Goals - 07/11/16 1541       PT SHORT TERM GOAL #1   Title After 4 weeks patient will report ability to complete 90% of activities at home without limitations from low back pain.    Status New     PT SHORT TERM GOAL #2   Title After 4 weeks patient will demonstrate improved functional movement and strength without guarding and fears inhibition AEB 5xSTS< 16 seconds.    Status New     PT SHORT TERM GOAL #3   Title After 4 weeks patient will demonstrate minimal falls risk in the home AEB BBT score of 48/56 or greater, a self selected gait speed of >1.67ms, and TUG<15sec.    Status New                  Plan - 07/23/16 1531    Clinical Impression Statement Pt brought her SPC with her to today's session. It is a wooden cane that appears to be too tall and this was discussed with the pt. Therapist discussed possible use of a RW and confirmed with the pt that she does have one at home that she can bring. Continued this visit with trunk rotation stretches to decrease pain with activity. Pt reported no pain during these stretches. Ended with balance activity, noting increased difficulty with unstable surfaces and requiring assistance to maintain her balance. Will continue to follow up with pt about her RW and encourage use of an AD to decrease her risk of falling.   Rehab Potential Good   PT Frequency 3x / week   PT Duration 4 weeks   PT Treatment/Interventions ADLs/Self Care Home Management;Electrical Stimulation;Moist Heat;DME Instruction;Therapeutic activities;Therapeutic exercise;Balance training;Neuromuscular re-education;Patient/family education;Passive range of motion;Manual techniques;Dry needling   PT Next Visit Plan pt was instructed to bring her RW to her next session (with help); standing balance activity on unstable surfaces   PT Home Exercise Plan Eval: Left SKTC stretch 3x30sec TID;    Consulted and Agree with Plan of Care Patient      Patient will benefit from skilled therapeutic intervention in  order to improve the following deficits and impairments:  Abnormal gait, Decreased coordination, Decreased range of motion, Difficulty walking, Increased fascial  restricitons, Impaired tone, Pain, Impaired UE functional use, Decreased knowledge of precautions, Hypomobility, Decreased mobility, Decreased strength, Postural dysfunction, Impaired flexibility, Decreased activity tolerance  Visit Diagnosis: Acute left-sided low back pain without sciatica  Muscle spasm of back  Difficulty in walking, not elsewhere classified  Other symptoms and signs involving the musculoskeletal system     Problem List Patient Active Problem List   Diagnosis Date Noted  . Acute blood loss anemia 09/10/2014  . GIB (gastrointestinal bleeding) 09/08/2014  . Occlusion and stenosis of carotid artery without mention of cerebral infarction 12/14/2013  . Osteoarthritis of left knee 04/23/2013  . Thumb pain 10/22/2012  . De Quervain's syndrome (tenosynovitis) 10/22/2012  . [facility] DJD(carpometacarpal degenerative joint disease), localized primary 10/22/2012  . CTS (carpal tunnel syndrome) 10/22/2012  . Trigger thumb of right hand 10/22/2012  . Pes anserinus bursitis of right knee 06/26/2012  . OA (osteoarthritis) of knee 06/26/2012  . Leg pain, left 06/26/2012  . Radicular pain of left lower extremity 06/26/2012  . Diverticulosis of colon with hemorrhage 09/22/2011  . Syncopal episodes 09/20/2011  . GI bleed 09/19/2011  . HTN (hypertension) 09/19/2011  . Anemia due to blood loss, acute 09/19/2011  . Abnormality of gait 01/03/2011  . Personal history of fall 12/27/2010  . DISLOCATION CLOSED SHOULDER NEC 04/25/2010  . ANSERINE BURSITIS, LEFT 02/13/2010  . KNEE PAIN 12/22/2009  . LEG PAIN, BILATERAL 12/22/2009  . TOTAL KNEE FOLLOW-UP 12/22/2009  . HIP PAIN 02/07/2009  . LOW BACK PAIN 02/07/2009    3:38 PM,07/23/16 Elly Modena PT, DPT [facility] [address] [place], [place], [address] Phone: [phone]   Fax:  [phone]  Name: JAMEKA IVIE MRN: [id number] Date of Birth: Aug 19, 1923

## 2016-07-25 ENCOUNTER — Ambulatory Visit (HOSPITAL_COMMUNITY): Payer: Medicare Other

## 2016-07-25 DIAGNOSIS — M545 Low back pain, unspecified: Secondary | ICD-10-CM

## 2016-07-25 DIAGNOSIS — R262 Difficulty in walking, not elsewhere classified: Secondary | ICD-10-CM

## 2016-07-25 DIAGNOSIS — M62830 Muscle spasm of back: Secondary | ICD-10-CM | POA: Diagnosis not present

## 2016-07-25 DIAGNOSIS — R29898 Other symptoms and signs involving the musculoskeletal system: Secondary | ICD-10-CM | POA: Diagnosis not present

## 2016-07-25 NOTE — Therapy (Signed)
[place], [place], [address] Phone: [phone]   Fax:  [phone]  Physical Therapy Treatment  Patient Details  Name: Cindy Schultz MRN: [id number] Date of Birth: September 10, 1923 Referring Provider: Sinda Du  Encounter Date: 07/25/2016      PT End of Session - 07/25/16 1044    Visit Number 7   Number of Visits 12   Date for PT Re-Evaluation 08/11/16   Authorization Type Medicare Traditional    Authorization Time Period 07/11/16-08/11/16   Authorization - Visit Number 7   Authorization - Number of Visits 10   PT Start Time 1856   PT Stop Time 1118   PT Time Calculation (min) 43 min   Equipment Utilized During Treatment Gait belt   Activity Tolerance Patient tolerated treatment well;No increased pain;Patient limited by fatigue   Behavior During Therapy [facility] for tasks assessed/performed      Past Medical History:  Diagnosis Date  . Cancer [facility]) March 2016   Skin Cancer :  Right Cheek     SCC  . Cancer [facility]) 2013   Skin Cancer  :  Anterior Neck   SCC  . Carotid artery occlusion   . Diverticulitis   . DVT (deep venous thrombosis) ([place]) 07/14/10   LEA doppler   . Glaucoma   . Hearing loss   . HTN (hypertension)    cholesterol  . Lumbar spine pain   . Lymphedema   . Macular degeneration   . Pain in joints   . Precancerous changes of the cervix    lesions  . Rectocele   . SOB (shortness of breath) 04/04/2010   2D echo EF=>55%    Past Surgical History:  Procedure Laterality Date  . ABDOMINAL HYSTERECTOMY    . appendix    . bladder tac    . COLONOSCOPY  09/20/2011   Procedure: COLONOSCOPY;  Surgeon: Beryle Beams, MD;  Location: [facility] ENDOSCOPY;  Service: Endoscopy;  Laterality: N/A;  . corneal erosion     cataracts   . elevated metatarsal arch    . fallen uterus    . hemmorrhoidectomy    . left total knee replacement  05/02/04   [name]  . met osteostomy    . morton's nueroma    .  OVARIAN CYST SURGERY    . RECTOCELE REPAIR    . rupture rt groin    . salk    . SKIN CANCER EXCISION Right March 2016  2013   Cheek -  [facility]  and  Anterior Neck  . tracheotomy      There were no vitals filed for this visit.      Subjective Assessment - 07/25/16 1040    Subjective Pt stated she has increased lumbar pain with standing still, no pain when sitting, unable to give number for back pain today.  Pt entered dept ambulating with no AD.   Pertinent History History of Left TKA (10YA), neck pain, and Rt rotator cuff tear x2 with poor outcome after 9 recent OT visits (NOV 17).    Currently in Pain? No/denies                         [facility] Adult PT Treatment/Exercise - 07/25/16 0001      Ambulation/Gait   Ambulation/Gait Yes   Ambulation/Gait Assistance 4: Min assist   Ambulation Distance (Feet) 452 Feet   Assistive device Rolling walker   Gait Pattern  Decreased stride length;Decreased dorsiflexion - right;Decreased dorsiflexion - left;Shuffle;Narrow base of support;Trunk flexed   Gait Comments Cueing for increased stride length and heel to toe mechanics to reduce shuffling     Lumbar Exercises: Supine   Bridge 15 reps   Other Supine Lumbar Exercises low trunk rotation x10 reps each side              Balance Exercises - 07/25/16 1107      Balance Exercises: Standing   Standing Eyes Opened Narrow base of support (BOS);Foam/compliant surface;2 reps;30 secs;Solid surface   Tandem Stance Eyes open;2 reps;30 secs   SLS Eyes open;2 reps;30 secs;Solid surface;Other reps (comment)  alternate LE on 12in step   Cone Rotation A/P;R/L;Foam/compliant surface   Other Standing Exercises NBOS on foam surface with trunk rotation Lt/Rt x10 reps each              PT Short Term Goals - 07/11/16 1541      PT SHORT TERM GOAL #1   Title After 4 weeks patient will report ability to complete 90% of activities at home without limitations from low back pain.     Status New     PT SHORT TERM GOAL #2   Title After 4 weeks patient will demonstrate improved functional movement and strength without guarding and fears inhibition AEB 5xSTS< 16 seconds.    Status New     PT SHORT TERM GOAL #3   Title After 4 weeks patient will demonstrate minimal falls risk in the home AEB BBT score of 48/56 or greater, a self selected gait speed of >1.71ms, and TUG<15sec.    Status New                  Plan - 07/25/16 1048    Clinical Impression Statement Pt entered dept ambulating with no AD, stated she put walker in car but doesn't wish to unload from car due to the rain.  Pt educated on importance of utilizing AD to reduce risk of fall.  Demonstrates a fear of falling presentation with shuffled gait and forward head posture.  RW used through session with vast improvements with increased stride length and improved posture.  Pt requires min to mod A with static balance activities on dynamic surface for safety.  Added cone rotation to improve trunk rotation and reaching outside BOS.  No reports of increased pain through session, was limited by fatigue.     Rehab Potential Good   PT Frequency 3x / week   PT Duration 4 weeks   PT Treatment/Interventions ADLs/Self Care Home Management;Electrical Stimulation;Moist Heat;DME Instruction;Therapeutic activities;Therapeutic exercise;Balance training;Neuromuscular re-education;Patient/family education;Passive range of motion;Manual techniques;Dry needling   PT Next Visit Plan pt was instructed to bring her RW to her next session (with help); standing balance activity on unstable surfaces      Patient will benefit from skilled therapeutic intervention in order to improve the following deficits and impairments:  Abnormal gait, Decreased coordination, Decreased range of motion, Difficulty walking, Increased fascial restricitons, Impaired tone, Pain, Impaired UE functional use, Decreased knowledge of precautions, Hypomobility,  Decreased mobility, Decreased strength, Postural dysfunction, Impaired flexibility, Decreased activity tolerance  Visit Diagnosis: Acute left-sided low back pain without sciatica  Muscle spasm of back  Difficulty in walking, not elsewhere classified  Other symptoms and signs involving the musculoskeletal system     Problem List Patient Active Problem List   Diagnosis Date Noted  . Acute blood loss anemia 09/10/2014  . GIB (gastrointestinal bleeding) 09/08/2014  .  Occlusion and stenosis of carotid artery without mention of cerebral infarction 12/14/2013  . Osteoarthritis of left knee 04/23/2013  . Thumb pain 10/22/2012  . De Quervain's syndrome (tenosynovitis) 10/22/2012  . [facility] DJD(carpometacarpal degenerative joint disease), localized primary 10/22/2012  . CTS (carpal tunnel syndrome) 10/22/2012  . Trigger thumb of right hand 10/22/2012  . Pes anserinus bursitis of right knee 06/26/2012  . OA (osteoarthritis) of knee 06/26/2012  . Leg pain, left 06/26/2012  . Radicular pain of left lower extremity 06/26/2012  . Diverticulosis of colon with hemorrhage 09/22/2011  . Syncopal episodes 09/20/2011  . GI bleed 09/19/2011  . HTN (hypertension) 09/19/2011  . Anemia due to blood loss, acute 09/19/2011  . Abnormality of gait 01/03/2011  . Personal history of fall 12/27/2010  . DISLOCATION CLOSED SHOULDER NEC 04/25/2010  . ANSERINE BURSITIS, LEFT 02/13/2010  . KNEE PAIN 12/22/2009  . LEG PAIN, BILATERAL 12/22/2009  . TOTAL KNEE FOLLOW-UP 12/22/2009  . HIP PAIN 02/07/2009  . LOW BACK PAIN 02/07/2009   Ihor Austin, [place]; [place]  Aldona Lento 07/25/2016, 12:52 PM  [place] [address] [place], [place], [address] Phone: [phone]   Fax:  [phone]  Name: Cindy Schultz MRN: [id number] Date of Birth: [birth year]

## 2016-07-27 ENCOUNTER — Ambulatory Visit (HOSPITAL_COMMUNITY): Payer: Medicare Other

## 2016-07-27 DIAGNOSIS — R262 Difficulty in walking, not elsewhere classified: Secondary | ICD-10-CM | POA: Diagnosis not present

## 2016-07-27 DIAGNOSIS — R29898 Other symptoms and signs involving the musculoskeletal system: Secondary | ICD-10-CM | POA: Diagnosis not present

## 2016-07-27 DIAGNOSIS — M62830 Muscle spasm of back: Secondary | ICD-10-CM | POA: Diagnosis not present

## 2016-07-27 DIAGNOSIS — M545 Low back pain, unspecified: Secondary | ICD-10-CM

## 2016-07-27 NOTE — Therapy (Addendum)
[place], [place], [address] Phone: [phone]   Fax:  [phone]  Physical Therapy Treatment  Patient Details  Name: Cindy Schultz MRN: [id number] Date of Birth: 1924/02/28 Referring Provider: Sinda Du  Encounter Date: 07/27/2016      PT End of Session - 07/27/16 1059    Visit Number 8   Number of Visits 12   Date for PT Re-Evaluation 08/11/16   Authorization Type Medicare Traditional    Authorization Time Period 07/11/16-08/11/16   Authorization - Visit Number 8   Authorization - Number of Visits 10   PT Start Time 4917   PT Stop Time 1114   PT Time Calculation (min) 38 min   Equipment Utilized During Treatment Gait belt   Activity Tolerance Patient tolerated treatment well;No increased pain;Patient limited by fatigue   Behavior During Therapy [facility] for tasks assessed/performed      Past Medical History:  Diagnosis Date  . Cancer [facility]) March 2016   Skin Cancer :  Right Cheek     SCC  . Cancer [facility]) 2013   Skin Cancer  :  Anterior Neck   SCC  . Carotid artery occlusion   . Diverticulitis   . DVT (deep venous thrombosis) ([place]) 07/14/10   LEA doppler   . Glaucoma   . Hearing loss   . HTN (hypertension)    cholesterol  . Lumbar spine pain   . Lymphedema   . Macular degeneration   . Pain in joints   . Precancerous changes of the cervix    lesions  . Rectocele   . SOB (shortness of breath) 04/04/2010   2D echo EF=>55%    Past Surgical History:  Procedure Laterality Date  . ABDOMINAL HYSTERECTOMY    . appendix    . bladder tac    . COLONOSCOPY  09/20/2011   Procedure: COLONOSCOPY;  Surgeon: Beryle Beams, MD;  Location[facility] ENDOSCOPY;  Service: Endoscopy;  Laterality: N/A;  . corneal erosion     cataracts   . elevated metatarsal arch    . fallen uterus    . hemmorrhoidectomy    . left total knee replacement  05/02/04   [name]  . met osteostomy    . morton's nueroma    .  OVARIAN CYST SURGERY    . RECTOCELE REPAIR    . rupture rt groin    . salk    . SKIN CANCER EXCISION Right March 2016  2013   Cheek -  [facility]  and  Anterior Neck  . tracheotomy      There were no vitals filed for this visit.      Subjective Assessment - 07/27/16 1040    Subjective Pt offers little upon arrival, with circumlocution after she is asked about how she's doing. She says she is stiff and sore "all over." Pt reports her RW remaisn in the car, and that she continues to not use it.    Pertinent History History of Left TKA (10YA), neck pain, and Rt rotator cuff tear x2 with poor outcome after 9 recent OT visits (NOV 17).    Currently in Pain? Other (Comment)  patient doe snot elaborate                         [facility] Adult PT Treatment/Exercise - 07/27/16 0001      Ambulation/Gait   Ambulation/Gait Assistance 5: Supervision;4: Min guard   Ambulation  Distance (Feet) 675 Feet   Assistive device Rolling walker   Gait Pattern Decreased stride length;Decreased dorsiflexion - right;Decreased dorsiflexion - left;Shuffle;Narrow base of support;Trunk flexed   Gait velocity 0.60ms   Gait Comments utilized the balance foam in hallway on this journey; VC for heel strike             Balance Exercises - 07/27/16 1055      Balance Exercises: Standing   Cone Rotation Limitations 15x bilat; 20x vertical     OTAGO PROGRAM   Knee Extensor 10 reps  2x10 bilat   Backwards Walking --  3x15'   Sideways Walking --  3x15' bilat   One Leg Stand --  fwd step steps, 4" step: 2x10 bilat   Sit to Stand 10 reps, no support  2x10             PT Short Term Goals - 07/11/16 1541      PT SHORT TERM GOAL #1   Title After 4 weeks patient will report ability to complete 90% of activities at home without limitations from low back pain.    Status New     PT SHORT TERM GOAL #2   Title After 4 weeks patient will demonstrate improved functional movement and strength  without guarding and fears inhibition AEB 5xSTS< 16 seconds.    Status New     PT SHORT TERM GOAL #3   Title After 4 weeks patient will demonstrate minimal falls risk in the home AEB BBT score of 48/56 or greater, a self selected gait speed of >1.039m, and TUG<15sec.    Status New                  Plan - 07/27/16 1100    Clinical Impression Statement Focus continues to remain on functional balance training, improving movement variability, as back pain remains stable and at baseline. Pt continues to demonstrate low power output adn trunk weakness taht decrease her ability to withstandlarge excursions outside of her center of gravity. RetroAMB/AMB also continue to remain limited byt small step lenght, pt given VC to take bigger steps.    Rehab Potential Good   PT Frequency 3x / week   PT Duration 4 weeks   PT Treatment/Interventions ADLs/Self Care Home Management;Electrical Stimulation;Moist Heat;DME Instruction;Therapeutic activities;Therapeutic exercise;Balance training;Neuromuscular re-education;Patient/family education;Passive range of motion;Manual techniques;Dry needling   PT Next Visit Plan dynamic balance with high movement variability, multi direction gait training, and standing balance activity on unstable surfaces   PT Home Exercise Plan Eval: Left SKTC stretch 3x30sec TID;    Consulted and Agree with Plan of Care Patient      Patient will benefit from skilled therapeutic intervention in order to improve the following deficits and impairments:  Abnormal gait, Decreased coordination, Decreased range of motion, Difficulty walking, Increased fascial restricitons, Impaired tone, Pain, Impaired UE functional use, Decreased knowledge of precautions, Hypomobility, Decreased mobility, Decreased strength, Postural dysfunction, Impaired flexibility, Decreased activity tolerance  Visit Diagnosis: Acute left-sided low back pain without sciatica  Muscle spasm of back  Difficulty in  walking, not elsewhere classified     Problem List Patient Active Problem List   Diagnosis Date Noted  . Acute blood loss anemia 09/10/2014  . GIB (gastrointestinal bleeding) 09/08/2014  . Occlusion and stenosis of carotid artery without mention of cerebral infarction 12/14/2013  . Osteoarthritis of left knee 04/23/2013  . Thumb pain 10/22/2012  . De Quervain's syndrome (tenosynovitis) 10/22/2012  . [facility]), localized  primary 10/22/2012  . CTS (carpal tunnel syndrome) 10/22/2012  . Trigger thumb of right hand 10/22/2012  . Pes anserinus bursitis of right knee 06/26/2012  . OA (osteoarthritis) of knee 06/26/2012  . Leg pain, left 06/26/2012  . Radicular pain of left lower extremity 06/26/2012  . Diverticulosis of colon with hemorrhage 09/22/2011  . Syncopal episodes 09/20/2011  . GI bleed 09/19/2011  . HTN (hypertension) 09/19/2011  . Anemia due to blood loss, acute 09/19/2011  . Abnormality of gait 01/03/2011  . Personal history of fall 12/27/2010  . DISLOCATION CLOSED SHOULDER NEC 04/25/2010  . ANSERINE BURSITIS, LEFT 02/13/2010  . KNEE PAIN 12/22/2009  . LEG PAIN, BILATERAL 12/22/2009  . TOTAL KNEE FOLLOW-UP 12/22/2009  . HIP PAIN 02/07/2009  . LOW BACK PAIN 02/07/2009    11:20 AM, 23-Aug-2016 Etta Grandchild, PT, DPT Physical Therapist at [facility] [phone] (office)     [place] [address] [place], [place], [address] Phone: [phone]   Fax:  [phone]  Name: Cindy Schultz MRN: [id number] Date of Birth: 1924-05-20     2016-08-23 1102  PT G-Codes*  Functional Assessment Tool Used (Outpatient Only) Clinical Judgment   Functional Limitation Mobility: Walking and moving around  Mobility: Walking and Moving Around Current Status [phone]) CJ  Mobility: Walking and Moving Around Goal Status [phone]) CJ   *Addendum on 01/02/17 to  retroactively include G-codes for this visit based on objective information from this visit.   8:26 AM, 01/02/17 Etta Grandchild, PT, DPT Physical Therapist - [place] [phone] (Office)

## 2016-07-30 ENCOUNTER — Ambulatory Visit (HOSPITAL_COMMUNITY): Payer: Medicare Other | Admitting: Physical Therapy

## 2016-07-30 DIAGNOSIS — M62830 Muscle spasm of back: Secondary | ICD-10-CM

## 2016-07-30 DIAGNOSIS — R262 Difficulty in walking, not elsewhere classified: Secondary | ICD-10-CM | POA: Diagnosis not present

## 2016-07-30 DIAGNOSIS — R29898 Other symptoms and signs involving the musculoskeletal system: Secondary | ICD-10-CM | POA: Diagnosis not present

## 2016-07-30 DIAGNOSIS — M545 Low back pain, unspecified: Secondary | ICD-10-CM

## 2016-07-30 NOTE — Therapy (Signed)
[place], [place], [address] Phone: [phone]   Fax:  [phone]  Physical Therapy Treatment  Patient Details  Name: Cindy Schultz MRN: [id number] Date of Birth: 1923/09/16 Referring Provider: Sinda Du  Encounter Date: 07/30/2016      PT End of Session - 07/30/16 1232    Visit Number 9   Number of Visits 12   Date for PT Re-Evaluation 08/11/16   Authorization Type Medicare Traditional    Authorization Time Period 07/11/16-08/11/16   Authorization - Visit Number 9   Authorization - Number of Visits 10   PT Start Time 5883   PT Stop Time 1115   PT Time Calculation (min) 33 min   Equipment Utilized During Treatment Gait belt   Activity Tolerance Patient tolerated treatment well   Behavior During Therapy [facility] for tasks assessed/performed      Past Medical History:  Diagnosis Date  . Cancer [facility]) March 2016   Skin Cancer :  Right Cheek     SCC  . Cancer [facility]) 2013   Skin Cancer  :  Anterior Neck   SCC  . Carotid artery occlusion   . Diverticulitis   . DVT (deep venous thrombosis) ([place]) 07/14/10   LEA doppler   . Glaucoma   . Hearing loss   . HTN (hypertension)    cholesterol  . Lumbar spine pain   . Lymphedema   . Macular degeneration   . Pain in joints   . Precancerous changes of the cervix    lesions  . Rectocele   . SOB (shortness of breath) 04/04/2010   2D echo EF=>55%    Past Surgical History:  Procedure Laterality Date  . ABDOMINAL HYSTERECTOMY    . appendix    . bladder tac    . COLONOSCOPY  09/20/2011   Procedure: COLONOSCOPY;  Surgeon: Beryle Beams, MD;  Location: [facility] ENDOSCOPY;  Service: Endoscopy;  Laterality: N/A;  . corneal erosion     cataracts   . elevated metatarsal arch    . fallen uterus    . hemmorrhoidectomy    . left total knee replacement  05/02/04   [name]  . met osteostomy    . morton's nueroma    . OVARIAN CYST SURGERY    . RECTOCELE REPAIR    .  rupture rt groin    . salk    . SKIN CANCER EXCISION Right March 2016  2013   Cheek -  [facility]  and  Anterior Neck  . tracheotomy      There were no vitals filed for this visit.                                 PT Short Term Goals - 07/30/16 1230      PT SHORT TERM GOAL #1   Title After 4 weeks patient will report ability to complete 90% of activities at home without limitations from low back pain.    Time 4   Period Weeks   Status On-going     PT SHORT TERM GOAL #2   Title After 4 weeks patient will demonstrate improved functional movement and strength without guarding and fears inhibition AEB 5xSTS< 16 seconds.    Time 4   Period Weeks   Status New     PT SHORT TERM GOAL #3   Title After 4 weeks patient will demonstrate  minimal falls risk in the home AEB BBT score of 48/56 or greater, a self selected gait speed of >1.81ms, and TUG<15sec.    Time 4   Period Weeks   Status New                  Plan - 07/30/16 1053    Clinical Impression Statement Pt tolerating treatment today however required increased time for all activities. Pt continues to require skilled therapy for functional mobility and balance training. Pt reporting no pain in her low back today, but did however report some mild discomfort in her left hip after performing her standing hip exercises. Pt still presenting with shuffling gait pattern with R foot worse with foot clearance compared to left. Pt able to correct with verbal instructions, but easily loses focus and diverts back to her short step length and shuffeling pattern.    Rehab Potential Good   PT Frequency --   PT Duration 4 weeks   PT Treatment/Interventions ADLs/Self Care Home Management;Electrical Stimulation;Moist Heat;DME Instruction;Therapeutic activities;Therapeutic exercise;Balance training;Neuromuscular re-education;Patient/family education;Passive range of motion;Manual techniques;Dry needling   PT Next Visit Plan  dynamic balance with high movement variability, multi direction gait training, and standing balance activity on unstable surfaces   PT Home Exercise Plan Eval: Left SKTC stretch 3x30sec TID;    Consulted and Agree with Plan of Care Patient      Patient will benefit from skilled therapeutic intervention in order to improve the following deficits and impairments:  Abnormal gait, Decreased coordination, Decreased range of motion, Difficulty walking, Increased fascial restricitons, Impaired tone, Pain, Impaired UE functional use, Decreased knowledge of precautions, Hypomobility, Decreased mobility, Decreased strength, Postural dysfunction, Impaired flexibility, Decreased activity tolerance  Visit Diagnosis: Acute left-sided low back pain without sciatica  Muscle spasm of back  Difficulty in walking, not elsewhere classified  Other symptoms and signs involving the musculoskeletal system     Problem List Patient Active Problem List   Diagnosis Date Noted  . Acute blood loss anemia 09/10/2014  . GIB (gastrointestinal bleeding) 09/08/2014  . Occlusion and stenosis of carotid artery without mention of cerebral infarction 12/14/2013  . Osteoarthritis of left knee 04/23/2013  . Thumb pain 10/22/2012  . De Quervain's syndrome (tenosynovitis) 10/22/2012  . [facility](carpometacarpal degenerative joint disease), localized primary 10/22/2012  . CTS (carpal tunnel syndrome) 10/22/2012  . Trigger thumb of right hand 10/22/2012  . Pes anserinus bursitis of right knee 06/26/2012  . OA (osteoarthritis) of knee 06/26/2012  . Leg pain, left 06/26/2012  . Radicular pain of left lower extremity 06/26/2012  . Diverticulosis of colon with hemorrhage 09/22/2011  . Syncopal episodes 09/20/2011  . GI bleed 09/19/2011  . HTN (hypertension) 09/19/2011  . Anemia due to blood loss, acute 09/19/2011  . Abnormality of gait 01/03/2011  . Personal history of fall 12/27/2010  . DISLOCATION CLOSED SHOULDER NEC  04/25/2010  . ANSERINE BURSITIS, LEFT 02/13/2010  . KNEE PAIN 12/22/2009  . LEG PAIN, BILATERAL 12/22/2009  . TOTAL KNEE FOLLOW-UP 12/22/2009  . HIP PAIN 02/07/2009  . LOW BACK PAIN 02/07/2009    JOretha Caprice MPT  07/30/2016, 12:34 PM  [id number] [address]Phone: [phone]  Fax:  [phone] Name: Cindy CRUMBLEYMRN: [id number] of Birth: [birth year]

## 2016-08-01 ENCOUNTER — Encounter (HOSPITAL_COMMUNITY): Payer: Self-pay | Admitting: Physical Therapy

## 2016-08-01 ENCOUNTER — Ambulatory Visit (HOSPITAL_COMMUNITY): Payer: Medicare Other | Admitting: Physical Therapy

## 2016-08-01 DIAGNOSIS — M545 Low back pain, unspecified: Secondary | ICD-10-CM

## 2016-08-01 DIAGNOSIS — R262 Difficulty in walking, not elsewhere classified: Secondary | ICD-10-CM

## 2016-08-01 DIAGNOSIS — R29898 Other symptoms and signs involving the musculoskeletal system: Secondary | ICD-10-CM | POA: Diagnosis not present

## 2016-08-01 DIAGNOSIS — M62830 Muscle spasm of back: Secondary | ICD-10-CM

## 2016-08-01 NOTE — Therapy (Signed)
[place], [place], [address] Phone: [phone]   Fax:  [phone]  Physical Therapy Treatment  Patient Details  Name: Cindy Schultz MRN: [id number] Date of Birth: 03/03/1924 Referring Provider: Sinda Du  Encounter Date: 08/01/2016      PT End of Session - 08/01/16 1034    Visit Number 10   Number of Visits 12   Date for PT Re-Evaluation 08/11/16   Authorization Type Medicare Traditional    Authorization Time Period 07/11/16-08/11/16   Authorization - Visit Number 10   Authorization - Number of Visits 10   PT Start Time 1030   PT Stop Time 1116   PT Time Calculation (min) 46 min   Equipment Utilized During Treatment Gait belt   Activity Tolerance Patient tolerated treatment well   Behavior During Therapy [facility] for tasks assessed/performed      Past Medical History:  Diagnosis Date  . Cancer [facility]) March 2016   Skin Cancer :  Right Cheek     SCC  . Cancer [facility]) 2013   Skin Cancer  :  Anterior Neck   SCC  . Carotid artery occlusion   . Diverticulitis   . DVT (deep venous thrombosis) ([place]) 07/14/10   LEA doppler   . Glaucoma   . Hearing loss   . HTN (hypertension)    cholesterol  . Lumbar spine pain   . Lymphedema   . Macular degeneration   . Pain in joints   . Precancerous changes of the cervix    lesions  . Rectocele   . SOB (shortness of breath) 04/04/2010   2D echo EF=>55%    Past Surgical History:  Procedure Laterality Date  . ABDOMINAL HYSTERECTOMY    . appendix    . bladder tac    . COLONOSCOPY  09/20/2011   Procedure: COLONOSCOPY;  Surgeon: Beryle Beams, MD;  Location: [facility] ENDOSCOPY;  Service: Endoscopy;  Laterality: N/A;  . corneal erosion     cataracts   . elevated metatarsal arch    . fallen uterus    . hemmorrhoidectomy    . left total knee replacement  05/02/04   [name]  . met osteostomy    . morton's nueroma    . OVARIAN CYST SURGERY    . RECTOCELE REPAIR     . rupture rt groin    . salk    . SKIN CANCER EXCISION Right March 2016  2013   Cheek -  [facility]  and  Anterior Neck  . tracheotomy      There were no vitals filed for this visit.      Subjective Assessment - 08/01/16 1033    Subjective Pt reports feel werry all over but doing ok. Pt denies pain just says she tiered.    Pertinent History History of Left TKA (10YA), neck pain, and Rt rotator cuff tear x2 with poor outcome after 9 recent OT visits (NOV 17).    How long can you sit comfortably? Does not limit    How long can you stand comfortably? Standing typically is more limited by chronic TKA, but she has not been out of the house since this problem started due to pain.    How long can you walk comfortably? Unsure.    Diagnostic tests None    Currently in Pain? No/denies  [place] Adult PT Treatment/Exercise - 08/01/16 0001      Ambulation/Gait   Ambulation/Gait Yes   Ambulation/Gait Assistance 5: Supervision;4: Min guard   Ambulation Distance (Feet) 376 Feet   Assistive device Rolling walker   Gait Pattern Decreased stride length;Decreased dorsiflexion - right;Decreased dorsiflexion - left;Shuffle;Narrow base of support;Trunk flexed   Gait Comments utilized the balance foam in hallway; VC to pick feet up     Lumbar Exercises: Stretches   Active Hamstring Stretch 2 reps;10 seconds  Rt LE     Lumbar Exercises: Standing   Other Standing Lumbar Exercises Standing hip abduction, extension, flexion   x10 Bil     Lumbar Exercises: Seated   Sit to Stand 10 reps   Sit to Stand Limitations no UE              Balance Exercises - 08/01/16 1046      Balance Exercises: Standing   Cone Rotation Limitations 15x bilat; 20x vertical     OTAGO PROGRAM   Knee Extensor 10 reps  2x 10 Bil             PT Short Term Goals - 07/30/16 1230      PT SHORT TERM GOAL #1   Title After 4 weeks patient will report ability to complete 90% of  activities at home without limitations from low back pain.    Time 4   Period Weeks   Status On-going     PT SHORT TERM GOAL #2   Title After 4 weeks patient will demonstrate improved functional movement and strength without guarding and fears inhibition AEB 5xSTS< 16 seconds.    Time 4   Period Weeks   Status New     PT SHORT TERM GOAL #3   Title After 4 weeks patient will demonstrate minimal falls risk in the home AEB BBT score of 48/56 or greater, a self selected gait speed of >1.69ms, and TUG<15sec.    Time 4   Period Weeks   Status New                  Plan - 08/01/16 1058    Clinical Impression Statement Pt did well with all exercsies today with rest breaks inbetween every few exercises. Pt needing some verbal cues for posture with standing exercises but able to correct. Pt continues to have shuffling gait and decreased heel strike with walking. Pt had some Rt calf pain after walking. Pt instructed on seated hamstring/ calf stretch which seemed to help.  Pt will continue to benefit from skilled therpy for LE strengthening and progression of balance.    Rehab Potential Good   PT Frequency 3x / week   PT Duration 4 weeks   PT Treatment/Interventions ADLs/Self Care Home Management;Electrical Stimulation;Moist Heat;DME Instruction;Therapeutic activities;Therapeutic exercise;Balance training;Neuromuscular re-education;Patient/family education;Passive range of motion;Manual techniques;Dry needling   PT Next Visit Plan postural strengthening, dynamic balance    Consulted and Agree with Plan of Care Patient      Patient will benefit from skilled therapeutic intervention in order to improve the following deficits and impairments:  Abnormal gait, Decreased coordination, Decreased range of motion, Difficulty walking, Increased fascial restricitons, Impaired tone, Pain, Impaired UE functional use, Decreased knowledge of precautions, Hypomobility, Decreased mobility, Decreased  strength, Postural dysfunction, Impaired flexibility, Decreased activity tolerance  Visit Diagnosis: Acute left-sided low back pain without sciatica  Muscle spasm of back  Difficulty in walking, not elsewhere classified     Problem List Patient Active Problem  List   Diagnosis Date Noted  . Acute blood loss anemia 09/10/2014  . GIB (gastrointestinal bleeding) 09/08/2014  . Occlusion and stenosis of carotid artery without mention of cerebral infarction 12/14/2013  . Osteoarthritis of left knee 04/23/2013  . Thumb pain 10/22/2012  . De Quervain's syndrome (tenosynovitis) 10/22/2012  . [facility] DJD(carpometacarpal degenerative joint disease), localized primary 10/22/2012  . CTS (carpal tunnel syndrome) 10/22/2012  . Trigger thumb of right hand 10/22/2012  . Pes anserinus bursitis of right knee 06/26/2012  . OA (osteoarthritis) of knee 06/26/2012  . Leg pain, left 06/26/2012  . Radicular pain of left lower extremity 06/26/2012  . Diverticulosis of colon with hemorrhage 09/22/2011  . Syncopal episodes 09/20/2011  . GI bleed 09/19/2011  . HTN (hypertension) 09/19/2011  . Anemia due to blood loss, acute 09/19/2011  . Abnormality of gait 01/03/2011  . Personal history of fall 12/27/2010  . DISLOCATION CLOSED SHOULDER NEC 04/25/2010  . ANSERINE BURSITIS, LEFT 02/13/2010  . KNEE PAIN 12/22/2009  . LEG PAIN, BILATERAL 12/22/2009  . TOTAL KNEE FOLLOW-UP 12/22/2009  . HIP PAIN 02/07/2009  . LOW BACK PAIN 02/07/2009    Mikle Bosworth PTA 08/01/2016, 11:49 AM  [place] [address] [place], [place], [address] Phone: [phone]   Fax:  [phone]  Name: Cindy Schultz MRN: [id number] Date of Birth: [birth year]

## 2016-08-03 ENCOUNTER — Ambulatory Visit (HOSPITAL_COMMUNITY): Payer: Medicare Other

## 2016-08-03 DIAGNOSIS — R29898 Other symptoms and signs involving the musculoskeletal system: Secondary | ICD-10-CM | POA: Diagnosis not present

## 2016-08-03 DIAGNOSIS — M62830 Muscle spasm of back: Secondary | ICD-10-CM | POA: Diagnosis not present

## 2016-08-03 DIAGNOSIS — R262 Difficulty in walking, not elsewhere classified: Secondary | ICD-10-CM | POA: Diagnosis not present

## 2016-08-03 DIAGNOSIS — M545 Low back pain, unspecified: Secondary | ICD-10-CM

## 2016-08-03 NOTE — Therapy (Signed)
[place], [place], [address] Phone: [phone]   Fax:  [phone]  Physical Therapy Treatment  Patient Details  Name: Cindy Schultz MRN: [id number] Date of Birth: 10/28/1923 Referring Provider: Sinda Du  Encounter Date: 08/03/2016      PT End of Session - 08/03/16 1052    Visit Number 11   Number of Visits 12   Date for PT Re-Evaluation 08/11/16   Authorization Type Medicare Traditional; Gcodes done 08/03/16   Authorization Time Period 07/11/16-08/11/16;    Authorization - Visit Number 11   Authorization - Number of Visits 20   PT Start Time 6962   PT Stop Time 1117   PT Time Calculation (min) 38 min   Equipment Utilized During Treatment Gait belt   Activity Tolerance Patient tolerated treatment well;No increased pain   Behavior During Therapy WFL for tasks assessed/performed      Past Medical History:  Diagnosis Date  . Cancer [facility]) March 2016   Skin Cancer :  Right Cheek     SCC  . Cancer [facility]) 2013   Skin Cancer  :  Anterior Neck   SCC  . Carotid artery occlusion   . Diverticulitis   . DVT (deep venous thrombosis) ([place]) 07/14/10   LEA doppler   . Glaucoma   . Hearing loss   . HTN (hypertension)    cholesterol  . Lumbar spine pain   . Lymphedema   . Macular degeneration   . Pain in joints   . Precancerous changes of the cervix    lesions  . Rectocele   . SOB (shortness of breath) 04/04/2010   2D echo EF=>55%    Past Surgical History:  Procedure Laterality Date  . ABDOMINAL HYSTERECTOMY    . appendix    . bladder tac    . COLONOSCOPY  09/20/2011   Procedure: COLONOSCOPY;  Surgeon: Beryle Beams, MD;  Location: [facility] ENDOSCOPY;  Service: Endoscopy;  Laterality: N/A;  . corneal erosion     cataracts   . elevated metatarsal arch    . fallen uterus    . hemmorrhoidectomy    . left total knee replacement  05/02/04   [name]  . met osteostomy    . morton's nueroma    . OVARIAN  CYST SURGERY    . RECTOCELE REPAIR    . rupture rt groin    . salk    . SKIN CANCER EXCISION Right March 2016  2013   Cheek -  [facility]  and  Anterior Neck  . tracheotomy      There were no vitals filed for this visit.      Subjective Assessment - 08/03/16 1051    Subjective Pt good today. Says her left knee hurts due to the nice weather. Just soreness, but does not rate.    Pertinent History History of Left TKA (10YA), neck pain, and Rt rotator cuff tear x2 with poor outcome after 9 recent OT visits (NOV 17).       Activities this session:    - AMB training with RW: 6.5 minutes VC for step through gait and heel strike. Difficulty with turning. 0.34ms -Left LAQ with 2lb weight, 2x10  -Wide stance trunk rotation 1x15bialt, Blue theraband -figure 8s with 2 cones 6 feet apart x10  -lateral stepping with sports cord 5x bilat -retro AMB w sports cord 5x           PT Short Term Goals -  07/30/16 1230      PT SHORT TERM GOAL #1   Title After 4 weeks patient will report ability to complete 90% of activities at home without limitations from low back pain.    Time 4   Period Weeks   Status On-going     PT SHORT TERM GOAL #2   Title After 4 weeks patient will demonstrate improved functional movement and strength without guarding and fears inhibition AEB 5xSTS< 16 seconds.    Time 4   Period Weeks   Status New     PT SHORT TERM GOAL #3   Title After 4 weeks patient will demonstrate minimal falls risk in the home AEB BBT score of 48/56 or greater, a self selected gait speed of >1.42ms, and TUG<15sec.    Time 4   Period Weeks   Status New                  Plan - 0February 27, [id number]    Clinical Impression Statement Pt tolerating sessionw ell today, with heavy focus on multiplananr gait trianing and frequent directional changes and perturbations at the pelvis. Pt continue to move slowly durign most activity. She demosntrates continued difficulty with turns in gait. She does  nto arrive with RW in spit eof multiple reminded to use it for community distance AMB. Making progress toward goals. Due for reassessment at her next visit.    Rehab Potential Good   PT Frequency 3x / week   PT Duration 4 weeks   PT Treatment/Interventions ADLs/Self Care Home Management;Electrical Stimulation;Moist Heat;DME Instruction;Therapeutic activities;Therapeutic exercise;Balance training;Neuromuscular re-education;Patient/family education;Passive range of motion;Manual techniques;Dry needling   PT Next Visit Plan postural strengthening, dynamic balance    PT Home Exercise Plan Eval: Left SKTC stretch 3x30sec TID;       Patient will benefit from skilled therapeutic intervention in order to improve the following deficits and impairments:  Abnormal gait, Decreased coordination, Decreased range of motion, Difficulty walking, Increased fascial restricitons, Impaired tone, Pain, Impaired UE functional use, Decreased knowledge of precautions, Hypomobility, Decreased mobility, Decreased strength, Postural dysfunction, Impaired flexibility, Decreased activity tolerance  Visit Diagnosis: Acute left-sided low back pain without sciatica  Muscle spasm of back  Difficulty in walking, not elsewhere classified       G-Codes - [id number]    Functional Assessment Tool Used Clinical Judgment    Functional Limitation Mobility: Walking and moving around   Mobility: Walking and Moving Around Current Status [phone] At least 20 percent but less than 40 percent impaired, limited or restricted   Mobility: Walking and Moving Around Goal Status [phone] At least 20 percent but less than 40 percent impaired, limited or restricted      Problem List Patient Active Problem List   Diagnosis Date Noted  . Acute blood loss anemia 09/10/2014  . GIB (gastrointestinal bleeding) 09/08/2014  . Occlusion and stenosis of carotid artery without mention of cerebral infarction 12/14/2013  . Osteoarthritis of left  knee 04/23/2013  . Thumb pain 10/22/2012  . De Quervain's syndrome (tenosynovitis) 10/22/2012  . [facility] localized primary 10/22/2012  . CTS (carpal tunnel syndrome) 10/22/2012  . Trigger thumb of right hand 10/22/2012  . Pes anserinus bursitis of right knee 06/26/2012  . OA (osteoarthritis) of knee 06/26/2012  . Leg pain, left 06/26/2012  . Radicular pain of left lower extremity 06/26/2012  . Diverticulosis of colon with hemorrhage 09/22/2011  . Syncopal episodes 09/20/2011  . GI bleed 09/19/2011  . HTN (hypertension)  09/19/2011  . Anemia due to blood loss, acute 09/19/2011  . Abnormality of gait 01/03/2011  . Personal history of fall 12/27/2010  . DISLOCATION CLOSED SHOULDER NEC 04/25/2010  . ANSERINE BURSITIS, LEFT 02/13/2010  . KNEE PAIN 12/22/2009  . LEG PAIN, BILATERAL 12/22/2009  . TOTAL KNEE FOLLOW-UP 12/22/2009  . HIP PAIN 02/07/2009  . LOW BACK PAIN 02/07/2009   11:25 AM, 08/03/16 Etta Grandchild, PT, DPT Physical Therapist at [facility] [phone] (office)     [place] [address] [place], [place], [address] Phone: [phone]   Fax:  [phone]  Name: Cindy Schultz MRN: [id number] Date of Birth: Apr 11, 1924

## 2016-08-06 ENCOUNTER — Encounter: Payer: Self-pay | Admitting: Orthopedic Surgery

## 2016-08-06 ENCOUNTER — Ambulatory Visit (HOSPITAL_COMMUNITY)
Admission: RE | Admit: 2016-08-06 | Discharge: 2016-08-06 | Disposition: A | Discharge status: Home / Self Care | Payer: Medicare Other | Source: Ambulatory Visit | Attending: Orthopedic Surgery | Admitting: Orthopedic Surgery

## 2016-08-06 ENCOUNTER — Ambulatory Visit (INDEPENDENT_AMBULATORY_CARE_PROVIDER_SITE_OTHER): Payer: Medicare Other | Admitting: Orthopedic Surgery

## 2016-08-06 DIAGNOSIS — M75101 Unspecified rotator cuff tear or rupture of right shoulder, not specified as traumatic: Secondary | ICD-10-CM | POA: Diagnosis not present

## 2016-08-06 DIAGNOSIS — M25562 Pain in left knee: Secondary | ICD-10-CM | POA: Diagnosis not present

## 2016-08-06 DIAGNOSIS — Z96652 Presence of left artificial knee joint: Secondary | ICD-10-CM | POA: Diagnosis not present

## 2016-08-06 DIAGNOSIS — S8992XA Unspecified injury of left lower leg, initial encounter: Secondary | ICD-10-CM | POA: Diagnosis not present

## 2016-08-06 NOTE — Progress Notes (Signed)
FOLLOW UP VISIT   Patient ID: Cindy Schultz, female   DOB: [birth year], 83 y.o.   MRN: [id number]  Chief Complaint  Patient presents with  . Follow-up    RIGHT ROTATOR CUFF  . Follow-up    YEARLY LT TKA, DOS 04/2004    HPI Cindy Schultz is a 83 y.o. female.   HPI  83 year old female had a left total knee about 13 years ago fell landed on her left side 4 weeks ago  She also had complaints of right shoulder pain with rotator cuff disease and tear  Review of Systems Review of Systems  Left knee pain after falling   Physical Exam  Mild tenderness lateral knee no effusion knee range of motion remains intact full weightbearing  Tenderness around right shoulder decreased range of motion of only 90 active flexion and 80 of apical abduction with tenderness around the glenohumeral joint posterior subacromial space   MEDICAL DECISION MAKING  DATA   X-ray data at the hospital  AP lateral and sunrise views left knee  I interpret the x-rays as follows: No fracture dislocation loosening or implant abnormalities are noted  DIAGNOSIS  Right rotator cuff tear  Status post left total knee stable  PLAN(RISK)    She does not want to have surgery and she failed therapy and to injections but was trying another injection   Procedure note the subacromial injection shoulder RIGHT  Verbal consent was obtained to inject the  RIGHT   Shoulder  Timeout was completed to confirm the injection site is a subacromial space of the  RIGHT  shoulder   Medication used Depo-Medrol 40 mg and lidocaine 1% 3 cc  Anesthesia was provided by ethyl chloride  The injection was performed in the RIGHT  posterior subacromial space. After pinning the skin with alcohol and anesthetized the skin with ethyl chloride the subacromial space was injected using a 20-gauge needle. There were no complications  Sterile dressing was applied.

## 2016-08-08 ENCOUNTER — Ambulatory Visit (HOSPITAL_COMMUNITY): Payer: Medicare Other | Admitting: Physical Therapy

## 2016-08-08 DIAGNOSIS — M545 Low back pain, unspecified: Secondary | ICD-10-CM

## 2016-08-08 DIAGNOSIS — R29898 Other symptoms and signs involving the musculoskeletal system: Secondary | ICD-10-CM | POA: Diagnosis not present

## 2016-08-08 DIAGNOSIS — M62830 Muscle spasm of back: Secondary | ICD-10-CM

## 2016-08-08 DIAGNOSIS — R262 Difficulty in walking, not elsewhere classified: Secondary | ICD-10-CM

## 2016-08-08 NOTE — Therapy (Signed)
[place], [place], [address] Phone: [phone]   Fax:  [phone]  Physical Therapy Treatment (Re-Assessment)  Patient Details  Name: Cindy Schultz MRN: [id number] Date of Birth: [birth year] Referring Provider: Sinda Du   Encounter Date: 08/08/2016      PT End of Session - 08/08/16 1136    Visit Number 12   Number of Visits 18   Date for PT Re-Evaluation 08/29/16   Authorization Type Medicare Traditional; Gcodes done 08/03/16 (11th visit)   Authorization Time Period 2/44/97-11/15/03; re-cert done on 06/28/19   Authorization - Visit Number 12   Authorization - Number of Visits 21   PT Start Time 1050   PT Stop Time 1130   PT Time Calculation (min) 40 min   Equipment Utilized During Treatment Gait belt   Activity Tolerance Patient tolerated treatment well   Behavior During Therapy [facility] for tasks assessed/performed      Past Medical History:  Diagnosis Date  . Cancer [facility]) March 2016   Skin Cancer :  Right Cheek     SCC  . Cancer [facility]) 2013   Skin Cancer  :  Anterior Neck   SCC  . Carotid artery occlusion   . Diverticulitis   . DVT (deep venous thrombosis) ([place]) 07/14/10   LEA doppler   . Glaucoma   . Hearing loss   . HTN (hypertension)    cholesterol  . Lumbar spine pain   . Lymphedema   . Macular degeneration   . Pain in joints   . Precancerous changes of the cervix    lesions  . Rectocele   . SOB (shortness of breath) 04/04/2010   2D echo EF=>55%    Past Surgical History:  Procedure Laterality Date  . ABDOMINAL HYSTERECTOMY    . appendix    . bladder tac    . COLONOSCOPY  09/20/2011   Procedure: COLONOSCOPY;  Surgeon: Beryle Beams, MD;  Location: [facility] ENDOSCOPY;  Service: Endoscopy;  Laterality: N/A;  . corneal erosion     cataracts   . elevated metatarsal arch    . fallen uterus    . hemmorrhoidectomy    . left total knee replacement  05/02/04   [name]  . met osteostomy     . morton's nueroma    . OVARIAN CYST SURGERY    . RECTOCELE REPAIR    . rupture rt groin    . salk    . SKIN CANCER EXCISION Right March 2016  2013   Cheek -  [facility]  and  Anterior Neck  . tracheotomy      There were no vitals filed for this visit.      Subjective Assessment - 08/08/16 1055    Subjective Patietn arrives today stating that her R shoulder is feeling the worst right now, as well as her L knee; she says her MD imaged it and it seems OK from what she understands. She does feel better after starting with PT; her back no longer bothers her but she is still concerned about her balance, she does not go outside like she used to as she is still concerned about her balance. She had a corn removed the other day and her foot is sore. No falls or close calls recently.     Pertinent History History of Left TKA (10YA), neck pain, and Rt rotator cuff tear x2 with poor outcome after 9 recent OT visits (NOV 17).  How long can you sit comfortably? 2/21- unlimited    How long can you stand comfortably? 2/21- not sure   How long can you walk comfortably? 2/21- short distances in the store, holding onto cart    Diagnostic tests None    Currently in Pain? Yes   Pain Score 6    Pain Location Other (Comment)  due to corn on foot    Pain Orientation Left   Pain Descriptors / Indicators Sore   Pain Type Chronic pain   Pain Radiating Towards none    Pain Onset In the past 7 days   Pain Frequency Constant   Aggravating Factors  movement, anything aggravates it    Pain Relieving Factors corn plaster with center cut out/pressure relief    Effect of Pain on Daily Activities limits tolerance to being on her feet             [facility] PT Assessment - 08/08/16 0001      Assessment   Medical Diagnosis Left sided low back pain s/p fall   Referring Provider Sinda Du    Onset Date/Surgical Date --  early 2018   Next MD Visit PRN with Dr. Luan Pulling      Balance Screen   Has the patient  fallen in the past 6 months Yes   How many times? 1   Has the patient had a decrease in activity level because of a fear of falling?  Yes   Is the patient reluctant to leave their home because of a fear of falling?  Yes     Prior Function   Level of Independence Independent   Vocation Retired     Strength   Right Hip Flexion 3/5   Right Hip ABduction 3/5  measured in sitting- patient unable to tolerate sidelying    Left Hip Flexion 3/5   Left Hip ABduction 3/5  measured in sitting- patietn unalbe to tolerate sidelying    Right Knee Flexion 3+/5   Right Knee Extension 3+/5   Left Knee Flexion 3+/5   Left Knee Extension 3/5   Right Ankle Dorsiflexion 4/5   Left Ankle Dorsiflexion 3+/5     Berg Balance Test   Sit to Stand Able to stand without using hands and stabilize independently   Standing Unsupported Able to stand safely 2 minutes   Sitting with Back Unsupported but Feet Supported on Floor or Stool Able to sit safely and securely 2 minutes   Stand to Sit Sits safely with minimal use of hands   Transfers Able to transfer safely, minor use of hands   Standing Unsupported with Eyes Closed Able to stand 10 seconds safely   Standing Ubsupported with Feet Together Able to place feet together independently and stand for 1 minute with supervision   From Standing, Reach Forward with Outstretched Arm Can reach confidently >25 cm (10")   From Standing Position, Pick up Object from Floor Able to pick up shoe, needs supervision   From Standing Position, Turn to Look Behind Over each Shoulder Turn sideways only but maintains balance   Turn 360 Degrees Able to turn 360 degrees safely but slowly   Standing Unsupported, Alternately Place Feet on Step/Stool Able to complete >2 steps/needs minimal assist   Standing Unsupported, One Foot in Front Needs help to step but can hold 15 seconds   Standing on One Leg Tries to lift leg/unable to hold 3 seconds but remains standing independently   Total  Score 41  [place] Adult PT Treatment/Exercise - 08/08/16 0001      Transfers   Five time sit to stand comments  26.5 no UEs                 PT Education - 08/08/16 1135    Education provided Yes   Education Details progress with skilled PT services, POC moving forward; role of PT in preventing falls and associated morbidities/mortalities, how a bit of extra PT can help to avoid financial stresses related to injury secondary to fall    Person(s) Educated Patient   Methods Explanation   Comprehension Verbalized understanding          PT Short Term Goals - 08/08/16 1122      PT SHORT TERM GOAL #1   Title After 4 weeks patient will report ability to complete 90% of activities at home without limitations from low back pain.    Baseline 2/21- she has plenty of time to do what she needs at home, no back pain    Time 4   Period Weeks   Status Achieved     PT SHORT TERM GOAL #2   Title After 4 weeks patient will demonstrate improved functional movement and strength without guarding and fears inhibition AEB 5xSTS< 16 seconds.    Time 4   Period Weeks   Status On-going     PT SHORT TERM GOAL #3   Title After 4 weeks patient will demonstrate minimal falls risk in the home AEB BBT score of 48/56 or greater, a self selected gait speed of >1.71ms, and TUG<15sec.    Baseline 2/21- 21 on BERG, gait speed and TUG not tested    Time 4   Period Weeks   Status On-going                  Plan - 08/08/16 1138    Clinical Impression Statement Re-assessment performed today. Patient arrives today reporting her back pain is largely resolved but her shoulder and knee still hurt, she is also concerned about her balance and limits herself quite a bit due to fear of falling. Examination reveals severe ongoing functional weakness, which per general review of past treatment sessions does not appear to have been a strong focus and significant functional  balance deficits. Patient in general appears quite deconditioned as well. Recommend an extension of skilled PT services for strong ongoing focus on balance/fall prevention, strength, and ultimate referral to senior center to promote regular activity/combat deleterious effects of deconditioning.    Rehab Potential Good   PT Frequency 2x / week   PT Duration 3 weeks   PT Treatment/Interventions ADLs/Self Care Home Management;Electrical Stimulation;Moist Heat;DME Instruction;Therapeutic activities;Therapeutic exercise;Balance training;Neuromuscular re-education;Patient/family education;Passive range of motion;Manual techniques;Dry needling   PT Next Visit Plan strong focus on functional balance/safety awareness, functional strength; ultimately patient is appropriate for referral [place] Agree with Plan of Care Patient      Patient will benefit from skilled therapeutic intervention in order to improve the following deficits and impairments:  Abnormal gait, Decreased coordination, Decreased range of motion, Difficulty walking, Increased fascial restricitons, Impaired tone, Pain, Impaired UE functional use, Decreased knowledge of precautions, Hypomobility, Decreased mobility, Decreased strength, Postural dysfunction, Impaired flexibility, Decreased activity tolerance  Visit Diagnosis: Acute left-sided low back pain without sciatica - Plan: PT plan of care cert/re-cert  Muscle spasm of back - Plan: PT plan of care cert/re-cert  Difficulty in walking, not  elsewhere classified - Plan: PT plan of care cert/re-cert     Problem List Patient Active Problem List   Diagnosis Date Noted  . Acute blood loss anemia 09/10/2014  . GIB (gastrointestinal bleeding) 09/08/2014  . Occlusion and stenosis of carotid artery without mention of cerebral infarction 12/14/2013  . Osteoarthritis of left knee 04/23/2013  . Thumb pain 10/22/2012  . De Quervain's syndrome  (tenosynovitis) 10/22/2012  . [facility] DJD(carpometacarpal degenerative joint disease), localized primary 10/22/2012  . CTS (carpal tunnel syndrome) 10/22/2012  . Trigger thumb of right hand 10/22/2012  . Pes anserinus bursitis of right knee 06/26/2012  . OA (osteoarthritis) of knee 06/26/2012  . Leg pain, left 06/26/2012  . Radicular pain of left lower extremity 06/26/2012  . Diverticulosis of colon with hemorrhage 09/22/2011  . Syncopal episodes 09/20/2011  . GI bleed 09/19/2011  . HTN (hypertension) 09/19/2011  . Anemia due to blood loss, acute 09/19/2011  . Abnormality of gait 01/03/2011  . Personal history of fall 12/27/2010  . DISLOCATION CLOSED SHOULDER NEC 04/25/2010  . ANSERINE BURSITIS, LEFT 02/13/2010  . KNEE PAIN 12/22/2009  . LEG PAIN, BILATERAL 12/22/2009  . TOTAL KNEE FOLLOW-UP 12/22/2009  . HIP PAIN 02/07/2009  . LOW BACK PAIN 02/07/2009    Deniece Ree PT, DPT [facility] [address] [place], [place], [address] Phone: [phone]   Fax:  [phone]  Name: CALAH GERSHMAN MRN: [id number] Date of Birth: [birth year]

## 2016-08-13 ENCOUNTER — Ambulatory Visit (HOSPITAL_COMMUNITY): Payer: Medicare Other | Admitting: Physical Therapy

## 2016-08-13 DIAGNOSIS — M62830 Muscle spasm of back: Secondary | ICD-10-CM | POA: Diagnosis not present

## 2016-08-13 DIAGNOSIS — L851 Acquired keratosis [keratoderma] palmaris et plantaris: Secondary | ICD-10-CM | POA: Diagnosis not present

## 2016-08-13 DIAGNOSIS — M79673 Pain in unspecified foot: Secondary | ICD-10-CM | POA: Diagnosis not present

## 2016-08-13 DIAGNOSIS — R29898 Other symptoms and signs involving the musculoskeletal system: Secondary | ICD-10-CM | POA: Diagnosis not present

## 2016-08-13 DIAGNOSIS — M545 Low back pain, unspecified: Secondary | ICD-10-CM

## 2016-08-13 DIAGNOSIS — I739 Peripheral vascular disease, unspecified: Secondary | ICD-10-CM | POA: Diagnosis not present

## 2016-08-13 DIAGNOSIS — R262 Difficulty in walking, not elsewhere classified: Secondary | ICD-10-CM | POA: Diagnosis not present

## 2016-08-13 DIAGNOSIS — B351 Tinea unguium: Secondary | ICD-10-CM | POA: Diagnosis not present

## 2016-08-13 NOTE — Therapy (Signed)
[facility] [address] [place], [place], [address] Phone: [phone]   Fax:  [phone]  Physical Therapy Treatment  Patient Details  Name: Cindy Schultz MRN: [id number] Date of Birth: 07/19/1923 Referring Provider: Edward Hawkins   Encounter Date: 08/13/2016      PT End of Session - 08/13/16 1540    Visit Number 13   Number of Visits 18   Date for PT Re-Evaluation 08/29/16   Authorization Type Medicare Traditional; Gcodes done 08/03/16 (11th visit)   Authorization Time Period 07/11/16-08/11/16; re-cert done on 08/08/16   Authorization - Visit Number 13   Authorization - Number of Visits 21   PT Start Time 1032   PT Stop Time 1115   PT Time Calculation (min) 43 min   Equipment Utilized During Treatment Gait belt   Activity Tolerance Patient tolerated treatment well   Behavior During Therapy WFL for tasks assessed/performed      Past Medical History:  Diagnosis Date  . Cancer ([facility]) March 2016   Skin Cancer :  Right Cheek     SCC  . Cancer ([facility]) 2013   Skin Cancer  :  Anterior Neck   SCC  . Carotid artery occlusion   . Diverticulitis   . DVT (deep venous thrombosis) ([facility]) 07/14/10   LEA doppler   . Glaucoma   . Hearing loss   . HTN (hypertension)    cholesterol  . Lumbar spine pain   . Lymphedema   . Macular degeneration   . Pain in joints   . Precancerous changes of the cervix    lesions  . Rectocele   . SOB (shortness of breath) 04/04/2010   2D echo EF=>55%    Past Surgical History:  Procedure Laterality Date  . ABDOMINAL HYSTERECTOMY    . appendix    . bladder tac    . COLONOSCOPY  09/20/2011   Procedure: COLONOSCOPY;  Surgeon: Patrick D Hung, MD;  Location: [facility] ENDOSCOPY;  Service: Endoscopy;  Laterality: N/A;  . corneal erosion     cataracts   . elevated metatarsal arch    . fallen uterus    . hemmorrhoidectomy    . left total knee replacement  05/02/04   [facility]  . met osteostomy    . morton's  nueroma    . OVARIAN CYST SURGERY    . RECTOCELE REPAIR    . rupture rt groin    . salk    . SKIN CANCER EXCISION Right March 2016  2013   Cheek -  SCC  and  Anterior Neck  . tracheotomy      There were no vitals filed for this visit.                       [facility] Adult PT Treatment/Exercise - 08/13/16 0001      Bed Mobility   Bed Mobility Rolling Left;Rolling Right     Lumbar Exercises: Seated   Sit to Stand 10 reps   Sit to Stand Limitations no UE      Lumbar Exercises: Supine   Bridge 10 reps   Straight Leg Raise 10 reps     Lumbar Exercises: Sidelying   Hip Abduction 10 reps     Lumbar Exercises: Prone   Straight Leg Raise 10 reps             Balance Exercises - 08/13/16 1100      Balance Exercises: Standing     Tandem Stance Eyes open;2 reps;30 secs   SLS Eyes open;2 reps;30 secs;Solid surface;Other reps (comment)  max of 5" on Rt LE and unable to maintain balance on Lt   Balance Beam tandem and sidestepping 2RT             PT Short Term Goals - 08/08/16 1122      PT SHORT TERM GOAL #1   Title After 4 weeks patient will report ability to complete 90% of activities at home without limitations from low back pain.    Baseline 2/21- she has plenty of time to do what she needs at home, no back pain    Time 4   Period Weeks   Status Achieved     PT SHORT TERM GOAL #2   Title After 4 weeks patient will demonstrate improved functional movement and strength without guarding and fears inhibition AEB 5xSTS< 16 seconds.    Time 4   Period Weeks   Status On-going     PT SHORT TERM GOAL #3   Title After 4 weeks patient will demonstrate minimal falls risk in the home AEB BBT score of 48/56 or greater, a self selected gait speed of >1.0m/s, and TUG<15sec.    Baseline 2/21- 21 on BERG, gait speed and TUG not tested    Time 4   Period Weeks   Status On-going                  Plan - 08/13/16 1547    Clinical Impression Statement  continued therapy with primary focus on improving LE strength and balance per findings in last re-evaluation.  Also worked on improving stride with gait as pateint tends to take short, choppy steps without heel to toe for safety.  Pt requires max verbal cues as has difficulty hearing and slow to initiate activities.  Balance beam is challenging for patient with more fear of activity than actual instability.  Completed bilateral LE strengtheing exercises in supine and sidelying and encoruaged patient to begin completing exercises at home.    Rehab Potential Good   PT Frequency 2x / week   PT Duration 3 weeks   PT Treatment/Interventions ADLs/Self Care Home Management;Electrical Stimulation;Moist Heat;DME Instruction;Therapeutic activities;Therapeutic exercise;Balance training;Neuromuscular re-education;Patient/family education;Passive range of motion;Manual techniques;Dry needling   PT Next Visit Plan strong focus on functional balance/safety awareness, functional strength; ultimately patient is appropriate for referral to local senior center at [place].  Next session update HEP as appears she is only completing KTC stretch.    Consulted and Agree with Plan of Care Patient      Patient will benefit from skilled therapeutic intervention in order to improve the following deficits and impairments:  Abnormal gait, Decreased coordination, Decreased range of motion, Difficulty walking, Increased fascial restricitons, Impaired tone, Pain, Impaired UE functional use, Decreased knowledge of precautions, Hypomobility, Decreased mobility, Decreased strength, Postural dysfunction, Impaired flexibility, Decreased activity tolerance  Visit Diagnosis: Acute left-sided low back pain without sciatica  Muscle spasm of back  Difficulty in walking, not elsewhere classified     Problem List Patient Active Problem List   Diagnosis Date Noted  . Acute blood loss anemia 09/10/2014  . GIB (gastrointestinal bleeding)  09/08/2014  . Occlusion and stenosis of carotid artery without mention of cerebral infarction 12/14/2013  . Osteoarthritis of left knee 04/23/2013  . Thumb pain 10/22/2012  . De Quervain's syndrome (tenosynovitis) 10/22/2012  . [facility] DJD(carpometacarpal degenerative joint disease), localized primary 10/22/2012  . CTS (carpal tunnel syndrome) 10/22/2012  .   Trigger thumb of right hand 10/22/2012  . Pes anserinus bursitis of right knee 06/26/2012  . OA (osteoarthritis) of knee 06/26/2012  . Leg pain, left 06/26/2012  . Radicular pain of left lower extremity 06/26/2012  . Diverticulosis of colon with hemorrhage 09/22/2011  . Syncopal episodes 09/20/2011  . GI bleed 09/19/2011  . HTN (hypertension) 09/19/2011  . Anemia due to blood loss, acute 09/19/2011  . Abnormality of gait 01/03/2011  . Personal history of fall 12/27/2010  . DISLOCATION CLOSED SHOULDER NEC 04/25/2010  . ANSERINE BURSITIS, LEFT 02/13/2010  . KNEE PAIN 12/22/2009  . LEG PAIN, BILATERAL 12/22/2009  . TOTAL KNEE FOLLOW-UP 12/22/2009  . HIP PAIN 02/07/2009  . LOW BACK PAIN 02/07/2009    Teena Irani 08/13/2016, 3:56 PM  [facility] [address] [place], [place], [address] Phone: [phone]   Fax:  [phone]  Name: Cindy Schultz MRN: [id number] Date of Birth: 09/19/1923

## 2016-08-13 NOTE — Therapy (Signed)
[place], [place], [address] Phone: [phone]   Fax:  [phone]  Physical Therapy Treatment  Patient Details  Name: Cindy Schultz MRN: [id number] Date of Birth: [birth year] Referring Provider: Sinda Du   Encounter Date: 08/13/2016      PT End of Session - 08/13/16 1540    Visit Number 13   Number of Visits 18   Date for PT Re-Evaluation 08/29/16   Authorization Type Medicare Traditional; Gcodes done 08/03/16 (11th visit)   Authorization Time Period 6/76/72-0/94/70; re-cert done on 9/62/83   Authorization - Visit Number 13   Authorization - Number of Visits 21   PT Start Time 6629   PT Stop Time 1115   PT Time Calculation (min) 43 min   Equipment Utilized During Treatment Gait belt   Activity Tolerance Patient tolerated treatment well   Behavior During Therapy [facility] for tasks assessed/performed      Past Medical History:  Diagnosis Date  . Cancer [facility]) March 2016   Skin Cancer :  Right Cheek     SCC  . Cancer [facility]) 2013   Skin Cancer  :  Anterior Neck   SCC  . Carotid artery occlusion   . Diverticulitis   . DVT (deep venous thrombosis) ([place]) 07/14/10   LEA doppler   . Glaucoma   . Hearing loss   . HTN (hypertension)    cholesterol  . Lumbar spine pain   . Lymphedema   . Macular degeneration   . Pain in joints   . Precancerous changes of the cervix    lesions  . Rectocele   . SOB (shortness of breath) 04/04/2010   2D echo EF=>55%    Past Surgical History:  Procedure Laterality Date  . ABDOMINAL HYSTERECTOMY    . appendix    . bladder tac    . COLONOSCOPY  09/20/2011   Procedure: COLONOSCOPY;  Surgeon: Beryle Beams, MD;  Location: [facility] ENDOSCOPY;  Service: Endoscopy;  Laterality: N/A;  . corneal erosion     cataracts   . elevated metatarsal arch    . fallen uterus    . hemmorrhoidectomy    . left total knee replacement  05/02/04   [name]  . met osteostomy    . morton's  nueroma    . OVARIAN CYST SURGERY    . RECTOCELE REPAIR    . rupture rt groin    . salk    . SKIN CANCER EXCISION Right March 2016  2013   Cheek [facility]  and  Anterior Neck  . tracheotomy      There were no vitals filed for this visit.      Subjective Assessment - 08/13/16 1556    Subjective Pt states she continues to have some pain in her knee, however her Rt shoulder remains more painful than her knee.  STates she has an appointment with a podiatrist for her painful corn on her Lt foot.  Admits to not completing any exercises or activities at home.    Currently in Pain? Yes   Pain Score 5    Pain Location Back   Pain Orientation Left   Pain Descriptors / Indicators Sore                         [facility] Adult PT Treatment/Exercise - 08/13/16 0001      Bed Mobility   Bed Mobility Rolling Left;Rolling Right  Lumbar Exercises: Seated   Sit to Stand 10 reps   Sit to Stand Limitations no UE      Lumbar Exercises: Supine   Bridge 10 reps   Straight Leg Raise 10 reps     Lumbar Exercises: Sidelying   Hip Abduction 10 reps     Lumbar Exercises: Prone   Straight Leg Raise 10 reps             Balance Exercises - 08/13/16 1100      Balance Exercises: Standing   Tandem Stance Eyes open;2 reps;30 secs   SLS Eyes open;2 reps;30 secs;Solid surface;Other reps (comment)  max of 5" on Rt LE and unable to maintain balance on Lt   Balance Beam tandem and sidestepping 2RT             PT Short Term Goals - 08/08/16 1122      PT SHORT TERM GOAL #1   Title After 4 weeks patient will report ability to complete 90% of activities at home without limitations from low back pain.    Baseline 2/21- she has plenty of time to do what she needs at home, no back pain    Time 4   Period Weeks   Status Achieved     PT SHORT TERM GOAL #2   Title After 4 weeks patient will demonstrate improved functional movement and strength without guarding and fears inhibition  AEB 5xSTS< 16 seconds.    Time 4   Period Weeks   Status On-going     PT SHORT TERM GOAL #3   Title After 4 weeks patient will demonstrate minimal falls risk in the home AEB BBT score of 48/56 or greater, a self selected gait speed of >1.82ms, and TUG<15sec.    Baseline 2/21- 21 on BERG, gait speed and TUG not tested    Time 4   Period Weeks   Status On-going                  Plan - 08/13/16 1547    Clinical Impression Statement continued therapy with primary focus on improving LE strength and balance per findings in last re-evaluation.  Also worked on improving stride with gait as pateint tends to take short, choppy steps without heel to toe for safety.  Pt requires max verbal cues as has difficulty hearing and slow to initiate activities.  Balance beam is challenging for patient with more fear of activity than actual instability.  Completed bilateral LE strengtheing exercises in supine and sidelying and encoruaged patient to begin completing exercises at home.    Rehab Potential Good   PT Frequency 2x / week   PT Duration 3 weeks   PT Treatment/Interventions ADLs/Self Care Home Management;Electrical Stimulation;Moist Heat;DME Instruction;Therapeutic activities;Therapeutic exercise;Balance training;Neuromuscular re-education;Patient/family education;Passive range of motion;Manual techniques;Dry needling   PT Next Visit Plan strong focus on functional balance/safety awareness, functional strength; ultimately patient is appropriate for referral to local senior center [place]  Next session update HEP as appears she is only completing KTC stretch.    Consulted and Agree with Plan of Care Patient      Patient will benefit from skilled therapeutic intervention in order to improve the following deficits and impairments:  Abnormal gait, Decreased coordination, Decreased range of motion, Difficulty walking, Increased fascial restricitons, Impaired tone, Pain, Impaired UE functional use,  Decreased knowledge of precautions, Hypomobility, Decreased mobility, Decreased strength, Postural dysfunction, Impaired flexibility, Decreased activity tolerance  Visit Diagnosis: Acute left-sided low back pain without  sciatica  Muscle spasm of back  Difficulty in walking, not elsewhere classified     Problem List Patient Active Problem List   Diagnosis Date Noted  . Acute blood loss anemia 09/10/2014  . GIB (gastrointestinal bleeding) 09/08/2014  . Occlusion and stenosis of carotid artery without mention of cerebral infarction 12/14/2013  . Osteoarthritis of left knee 04/23/2013  . Thumb pain 10/22/2012  . De Quervain's syndrome (tenosynovitis) 10/22/2012  . [facility] DJD(carpometacarpal degenerative joint disease), localized primary 10/22/2012  . CTS (carpal tunnel syndrome) 10/22/2012  . Trigger thumb of right hand 10/22/2012  . Pes anserinus bursitis of right knee 06/26/2012  . OA (osteoarthritis) of knee 06/26/2012  . Leg pain, left 06/26/2012  . Radicular pain of left lower extremity 06/26/2012  . Diverticulosis of colon with hemorrhage 09/22/2011  . Syncopal episodes 09/20/2011  . GI bleed 09/19/2011  . HTN (hypertension) 09/19/2011  . Anemia due to blood loss, acute 09/19/2011  . Abnormality of gait 01/03/2011  . Personal history of fall 12/27/2010  . DISLOCATION CLOSED SHOULDER NEC 04/25/2010  . ANSERINE BURSITIS, LEFT 02/13/2010  . KNEE PAIN 12/22/2009  . LEG PAIN, BILATERAL 12/22/2009  . TOTAL KNEE FOLLOW-UP 12/22/2009  . HIP PAIN 02/07/2009  . LOW BACK PAIN 02/07/2009    Teena Irani, PTA/CLT [phone]  08/13/2016, 4:00 PM  [facility] [address] [place], [place], [address] Phone: [phone]   Fax:  [phone]  Name: Cindy Schultz MRN: [id number] Date of Birth: 1924/06/16

## 2016-08-15 ENCOUNTER — Ambulatory Visit (HOSPITAL_COMMUNITY): Payer: Medicare Other

## 2016-08-15 DIAGNOSIS — R262 Difficulty in walking, not elsewhere classified: Secondary | ICD-10-CM | POA: Diagnosis not present

## 2016-08-15 DIAGNOSIS — M545 Low back pain, unspecified: Secondary | ICD-10-CM

## 2016-08-15 DIAGNOSIS — R29898 Other symptoms and signs involving the musculoskeletal system: Secondary | ICD-10-CM

## 2016-08-15 DIAGNOSIS — M62830 Muscle spasm of back: Secondary | ICD-10-CM

## 2016-08-15 NOTE — Patient Instructions (Signed)
Bridge    Lie back, legs bent. Inhale, pressing hips up. Keeping ribs in, lengthen lower back. Exhale, rolling down along spine from top. Repeat 10  times. Do 2 sessions per day.  http://pm.exer.us/55   Copyright  VHI. All rights reserved.   Abduction    Lift leg up toward ceiling. Return.  Repeat 10  times each leg. Do 2 sessions per day.  http://gt2.exer.us/386   Copyright  VHI. All rights reserved.   Functional Quadriceps: Sit to Stand    Sit on edge of chair, feet flat on floor. Stand upright, extending knees fully. Repeat 10 times per set. Do 2 sets per session. Do 1-2 sessions per day.  http://orth.exer.us/735   Copyright  VHI. All rights reserved.   Heel Raises    Stand with support. Tighten pelvic floor and hold. With knees straight, raise heels off ground. Hold 3 seconds.  Repeat 10 times. Do 2 times a day.  Copyright  VHI. All rights reserved.

## 2016-08-15 NOTE — Therapy (Signed)
[place], [place], [address] Phone: [phone]   Fax:  [phone]  Physical Therapy Treatment  Patient Details  Name: Cindy Schultz MRN: [id number] Date of Birth: [birth year] Referring Provider: Sinda Du   Encounter Date: 08/15/2016      PT End of Session - 08/15/16 1131    Visit Number 14   Number of Visits 18   Date for PT Re-Evaluation 08/29/16   Authorization Type Medicare Traditional; Gcodes done 08/03/16 (11th visit)   Authorization Time Period 1/91/66-0/60/04; re-cert done on 5/99/77   Authorization - Visit Number 14   Authorization - Number of Visits 21   PT Start Time 4142   PT Stop Time 1205   PT Time Calculation (min) 42 min   Equipment Utilized During Treatment Gait belt   Activity Tolerance Patient tolerated treatment well;No increased pain   Behavior During Therapy WFL for tasks assessed/performed  Fearful with standing balance activtiies      Past Medical History:  Diagnosis Date  . Cancer [facility]) March 2016   Skin Cancer :  Right Cheek     SCC  . Cancer [facility]) 2013   Skin Cancer  :  Anterior Neck   SCC  . Carotid artery occlusion   . Diverticulitis   . DVT (deep venous thrombosis) ([place]) 07/14/10   LEA doppler   . Glaucoma   . Hearing loss   . HTN (hypertension)    cholesterol  . Lumbar spine pain   . Lymphedema   . Macular degeneration   . Pain in joints   . Precancerous changes of the cervix    lesions  . Rectocele   . SOB (shortness of breath) 04/04/2010   2D echo EF=>55%    Past Surgical History:  Procedure Laterality Date  . ABDOMINAL HYSTERECTOMY    . appendix    . bladder tac    . COLONOSCOPY  09/20/2011   Procedure: COLONOSCOPY;  Surgeon: Beryle Beams, MD;  Location[facility] ENDOSCOPY;  Service: Endoscopy;  Laterality: N/A;  . corneal erosion     cataracts   . elevated metatarsal arch    . fallen uterus    . hemmorrhoidectomy    . left total knee  replacement  05/02/04   [name]  . met osteostomy    . morton's nueroma    . OVARIAN CYST SURGERY    . RECTOCELE REPAIR    . rupture rt groin    . salk    . SKIN CANCER EXCISION Right March 2016  2013   Cheek -  [facility]  and  Anterior Neck  . tracheotomy      There were no vitals filed for this visit.      Subjective Assessment - 08/15/16 1119    Subjective Pt stated she is feeling okay today, no reports of pain today.  She admits to not completeing any exercises at home due to fear of falling with HEP.     Pertinent History History of Left TKA (10YA), neck pain, and Rt rotator cuff tear x2 with poor outcome after 9 recent OT visits (NOV 17).    Currently in Pain? No/denies                         [facility] Adult PT Treatment/Exercise - 08/15/16 0001      Lumbar Exercises: Seated   Sit to Stand 10 reps   Sit to Stand Limitations  no UE      Lumbar Exercises: Supine   Bridge 10 reps   Straight Leg Raise 10 reps     Lumbar Exercises: Sidelying   Hip Abduction 10 reps     Lumbar Exercises: Prone   Straight Leg Raise 10 reps             Balance Exercises - 08/15/16 1220      Balance Exercises: Standing   Tandem Stance Eyes open;2 reps;30 secs   SLS Eyes open;3 reps  5" max    Step Over Hurdles / Cones 6 and 12 in 1RT with 1 HHA for fear           PT Education - 08/15/16 1218    Education provided Yes   Education Details Reviewed importance of compliance with HEP, encouraged to increase frequency   Person(s) Educated Patient   Methods Explanation;Demonstration;Handout   Comprehension Verbalized understanding;Returned demonstration;Need further instruction          PT Short Term Goals - 08/08/16 1122      PT SHORT TERM GOAL #1   Title After 4 weeks patient will report ability to complete 90% of activities at home without limitations from low back pain.    Baseline 2/21- she has plenty of time to do what she needs at home, no back pain     Time 4   Period Weeks   Status Achieved     PT SHORT TERM GOAL #2   Title After 4 weeks patient will demonstrate improved functional movement and strength without guarding and fears inhibition AEB 5xSTS< 16 seconds.    Time 4   Period Weeks   Status On-going     PT SHORT TERM GOAL #3   Title After 4 weeks patient will demonstrate minimal falls risk in the home AEB BBT score of 48/56 or greater, a self selected gait speed of >1.25ms, and TUG<15sec.    Baseline 2/21- 21 on BERG, gait speed and TUG not tested    Time 4   Period Weeks   Status On-going                  Plan - 08/15/16 1140    Clinical Impression Statement Session focus on improving LE strengthening and balance per findings in last re-eval.  Reviewed importance of completing HEP more frequently for maximal benefits  Pt stated she continues to be very fearful to complete standing exercises.  Reviewed importance and additional mat exercises given to pt for proximal strengthening.  Pt verbalized understanding.  Pt continues to be fearful of falling with gait with AD and standing balance activities, educated on importance of increasing stride length, proper heel to toe mechanics and to improve posture to reduce risk of fall.  Added hurdles to improve SLS with min A required.     Rehab Potential Good   PT Frequency 2x / week   PT Duration 3 weeks   PT Treatment/Interventions ADLs/Self Care Home Management;Electrical Stimulation;Moist Heat;DME Instruction;Therapeutic activities;Therapeutic exercise;Balance training;Neuromuscular re-education;Patient/family education;Passive range of motion;Manual techniques;Dry needling   PT Next Visit Plan strong focus on functional balance/safety awareness, functional strength; ultimately patient is appropriate for referral to local senior center at [place]     PT Home Exercise Plan Eval: Left SKTC stretch 3x30sec TID; 02/28: bridges, S/L abduction, STS no UE and heel raises with UE support       Patient will benefit from skilled therapeutic intervention in order to improve the following deficits and impairments:  Abnormal gait, Decreased coordination, Decreased range of motion, Difficulty walking, Increased fascial restricitons, Impaired tone, Pain, Impaired UE functional use, Decreased knowledge of precautions, Hypomobility, Decreased mobility, Decreased strength, Postural dysfunction, Impaired flexibility, Decreased activity tolerance  Visit Diagnosis: Acute left-sided low back pain without sciatica  Muscle spasm of back  Difficulty in walking, not elsewhere classified  Other symptoms and signs involving the musculoskeletal system     Problem List Patient Active Problem List   Diagnosis Date Noted  . Acute blood loss anemia 09/10/2014  . GIB (gastrointestinal bleeding) 09/08/2014  . Occlusion and stenosis of carotid artery without mention of cerebral infarction 12/14/2013  . Osteoarthritis of left knee 04/23/2013  . Thumb pain 10/22/2012  . De Quervain's syndrome (tenosynovitis) 10/22/2012  . [facility] DJD(carpometacarpal degenerative joint disease), localized primary 10/22/2012  . CTS (carpal tunnel syndrome) 10/22/2012  . Trigger thumb of right hand 10/22/2012  . Pes anserinus bursitis of right knee 06/26/2012  . OA (osteoarthritis) of knee 06/26/2012  . Leg pain, left 06/26/2012  . Radicular pain of left lower extremity 06/26/2012  . Diverticulosis of colon with hemorrhage 09/22/2011  . Syncopal episodes 09/20/2011  . GI bleed 09/19/2011  . HTN (hypertension) 09/19/2011  . Anemia due to blood loss, acute 09/19/2011  . Abnormality of gait 01/03/2011  . Personal history of fall 12/27/2010  . DISLOCATION CLOSED SHOULDER NEC 04/25/2010  . ANSERINE BURSITIS, LEFT 02/13/2010  . KNEE PAIN 12/22/2009  . LEG PAIN, BILATERAL 12/22/2009  . TOTAL KNEE FOLLOW-UP 12/22/2009  . HIP PAIN 02/07/2009  . LOW BACK PAIN 02/07/2009    Aldona Lento 08/15/2016, 12:21  PM  [place], [place], [address] Phone: [phone]   Fax:  [phone]  Name: Cindy Schultz MRN: [id number] Date of Birth: 1924-04-12

## 2016-08-20 ENCOUNTER — Ambulatory Visit (HOSPITAL_COMMUNITY): Payer: Medicare Other | Attending: Orthopedic Surgery

## 2016-08-20 DIAGNOSIS — M545 Low back pain, unspecified: Secondary | ICD-10-CM

## 2016-08-20 DIAGNOSIS — R262 Difficulty in walking, not elsewhere classified: Secondary | ICD-10-CM | POA: Insufficient documentation

## 2016-08-20 DIAGNOSIS — R29898 Other symptoms and signs involving the musculoskeletal system: Secondary | ICD-10-CM | POA: Diagnosis not present

## 2016-08-20 DIAGNOSIS — M62830 Muscle spasm of back: Secondary | ICD-10-CM | POA: Diagnosis not present

## 2016-08-20 NOTE — Therapy (Signed)
[place], [place], [address] Phone: [phone]   Fax:  [phone]  Physical Therapy Treatment  Patient Details  Name: Cindy Schultz MRN: [id number] Date of Birth: Dec 08, 1923 Referring Provider: Sinda Du   Encounter Date: 08/20/2016      PT End of Session - 08/20/16 1412    Visit Number 15   Number of Visits 18   Date for PT Re-Evaluation 08/29/16   Authorization Type Medicare Traditional; Gcodes done 08/03/16 (11th visit)   Authorization Time Period 9/70/26-3/78/58; re-cert done on 8/50/27   Authorization - Visit Number 15   Authorization - Number of Visits 21   PT Start Time 7412   PT Stop Time 1427   PT Time Calculation (min) 38 min   Activity Tolerance Patient tolerated treatment well;No increased pain   Behavior During Therapy WFL for tasks assessed/performed      Past Medical History:  Diagnosis Date  . Cancer [facility]) March 2016   Skin Cancer :  Right Cheek     SCC  . Cancer [facility]) 2013   Skin Cancer  :  Anterior Neck   SCC  . Carotid artery occlusion   . Diverticulitis   . DVT (deep venous thrombosis) ([place]) 07/14/10   LEA doppler   . Glaucoma   . Hearing loss   . HTN (hypertension)    cholesterol  . Lumbar spine pain   . Lymphedema   . Macular degeneration   . Pain in joints   . Precancerous changes of the cervix    lesions  . Rectocele   . SOB (shortness of breath) 04/04/2010   2D echo EF=>55%    Past Surgical History:  Procedure Laterality Date  . ABDOMINAL HYSTERECTOMY    . appendix    . bladder tac    . COLONOSCOPY  09/20/2011   Procedure: COLONOSCOPY;  Surgeon: Beryle Beams, MD;  Location: [facility] ENDOSCOPY;  Service: Endoscopy;  Laterality: N/A;  . corneal erosion     cataracts   . elevated metatarsal arch    . fallen uterus    . hemmorrhoidectomy    . left total knee replacement  05/02/04   [name]  . met osteostomy    . morton's nueroma    . OVARIAN CYST SURGERY     . RECTOCELE REPAIR    . rupture rt groin    . salk    . SKIN CANCER EXCISION Right March 2016  2013   Cheek -  [facility]  and  Anterior Neck  . tracheotomy      There were no vitals filed for this visit.      Subjective Assessment - 08/20/16 1353    Subjective Pt doing ok today, still with soem pain in her Right shoulder. She reports not using her walker at all despite multiple recommendations from PT. She reports she uses her cane more, but she arrives without it today.    Pertinent History History of Left TKA (10YA), neck pain, and Rt rotator cuff tear x2 with poor outcome after 9 recent OT visits (NOV 17).    Currently in Pain? No/denies                         [facility] Adult PT Treatment/Exercise - 08/20/16 0001      Transfers   Five time sit to stand comments  Performed 4 sets of 5:  average time 25s  Exercises   Exercises Knee/Hip     Lumbar Exercises: Seated   Hip Flexion on Ball Both;Strengthening;10 reps  1lb ankle weight 3x10 bilat   Sit to Stand --  4 sets of 5     Knee/Hip Exercises: Standing   Lateral Step Up Both;2 sets;10 reps;Step Height: 4";Hand Hold: 1   Forward Step Up 2 sets;Both;Step Height: 4";10 reps;Hand Hold: 1   Other Standing Knee Exercises Retro AMB with 1lb ankle weights 4x30              Balance Exercises - 08/20/16 1406      Balance Exercises: Standing   Tandem Stance 5 reps;20 secs;Cognitive challenge;Eyes open  difficulty following sequenced and simple instructions      OTAGO PROGRAM   Backwards Walking --  1x51fet, 1lb cuff weights on legs   Sideways Walking --  1x381f bilatt, 1lb cuff weights on legs             PT Short Term Goals - 08/08/16 1122      PT SHORT TERM GOAL #1   Title After 4 weeks patient will report ability to complete 90% of activities at home without limitations from low back pain.    Baseline 2/21- she has plenty of time to do what she needs at home, no back pain    Time 4    Period Weeks   Status Achieved     PT SHORT TERM GOAL #2   Title After 4 weeks patient will demonstrate improved functional movement and strength without guarding and fears inhibition AEB 5xSTS< 16 seconds.    Time 4   Period Weeks   Status On-going     PT SHORT TERM GOAL #3   Title After 4 weeks patient will demonstrate minimal falls risk in the home AEB BBT score of 48/56 or greater, a self selected gait speed of >1.36m936m and TUG<15sec.    Baseline 2/21- 21 on BERG, gait speed and TUG not tested    Time 4   Period Weeks   Status On-going                  Plan - 08/20/16 1413    Clinical Impression Statement Session continues to focus on functional strengthening and advanced balance training. Pt tolerating session well overall, but is often reluctant to perform many activities due to fears of falling/LOB. She requires  heavy tactile and verabl cues to perform half of exercises correctly, and often requires heavy cuing to remember to count. Pt still inconsistent with AD in community despite multiple reminders. Pt making mild progress toward goals overall.  Cognitive deficits are somewhat limiting to progress and/or participation at times.    Rehab Potential Fair   PT Frequency 2x / week   PT Duration 3 weeks   PT Treatment/Interventions ADLs/Self Care Home Management;Electrical Stimulation;Moist Heat;DME Instruction;Therapeutic activities;Therapeutic exercise;Balance training;Neuromuscular re-education;Patient/family education;Passive range of motion;Manual techniques;Dry needling   PT Next Visit Plan strong focus on functional balance/safety awareness, functional strength; ultimately patient is appropriate for referral to local senior center at [place]   PT Home Exercise Plan Eval: Left SKTC stretch 3x30sec TID; 02/28: bridges, S/L abduction, STS no UE and heel raises with UE support   Consulted and Agree with Plan of Care Patient      Patient will benefit from skilled  therapeutic intervention in order to improve the following deficits and impairments:  Abnormal gait, Decreased coordination, Decreased range of motion, Difficulty walking, Increased fascial restricitons,  Impaired tone, Pain, Impaired UE functional use, Decreased knowledge of precautions, Hypomobility, Decreased mobility, Decreased strength, Postural dysfunction, Impaired flexibility, Decreased activity tolerance  Visit Diagnosis: Acute left-sided low back pain without sciatica  Muscle spasm of back  Difficulty in walking, not elsewhere classified  Other symptoms and signs involving the musculoskeletal system     Problem List Patient Active Problem List   Diagnosis Date Noted  . Acute blood loss anemia 09/10/2014  . GIB (gastrointestinal bleeding) 09/08/2014  . Occlusion and stenosis of carotid artery without mention of cerebral infarction 12/14/2013  . Osteoarthritis of left knee 04/23/2013  . Thumb pain 10/22/2012  . De Quervain's syndrome (tenosynovitis) 10/22/2012  . [facility] DJD(carpometacarpal degenerative joint disease), localized primary 10/22/2012  . CTS (carpal tunnel syndrome) 10/22/2012  . Trigger thumb of right hand 10/22/2012  . Pes anserinus bursitis of right knee 06/26/2012  . OA (osteoarthritis) of knee 06/26/2012  . Leg pain, left 06/26/2012  . Radicular pain of left lower extremity 06/26/2012  . Diverticulosis of colon with hemorrhage 09/22/2011  . Syncopal episodes 09/20/2011  . GI bleed 09/19/2011  . HTN (hypertension) 09/19/2011  . Anemia due to blood loss, acute 09/19/2011  . Abnormality of gait 01/03/2011  . Personal history of fall 12/27/2010  . DISLOCATION CLOSED SHOULDER NEC 04/25/2010  . ANSERINE BURSITIS, LEFT 02/13/2010  . KNEE PAIN 12/22/2009  . LEG PAIN, BILATERAL 12/22/2009  . TOTAL KNEE FOLLOW-UP 12/22/2009  . HIP PAIN 02/07/2009  . LOW BACK PAIN 02/07/2009    2:30 PM, 08/20/16 Etta Grandchild, PT, DPT Physical Therapist at [place] [phone] (office)     [place] [address] [place], [place], [address] Phone: [phone]   Fax:  [phone]  Name: Cindy Schultz MRN: [id number] Date of Birth: 1923/09/20

## 2016-08-22 ENCOUNTER — Ambulatory Visit (HOSPITAL_COMMUNITY): Payer: Medicare Other

## 2016-08-22 DIAGNOSIS — R29898 Other symptoms and signs involving the musculoskeletal system: Secondary | ICD-10-CM | POA: Diagnosis not present

## 2016-08-22 DIAGNOSIS — R262 Difficulty in walking, not elsewhere classified: Secondary | ICD-10-CM | POA: Diagnosis not present

## 2016-08-22 DIAGNOSIS — M545 Low back pain, unspecified: Secondary | ICD-10-CM

## 2016-08-22 DIAGNOSIS — M62830 Muscle spasm of back: Secondary | ICD-10-CM

## 2016-08-22 NOTE — Therapy (Signed)
[place], [place], [address] Phone: [phone]   Fax:  [phone]  Physical Therapy Treatment  Patient Details  Name: Cindy Schultz MRN: [id number] Date of Birth: Apr 01, 1924 Referring Provider: Sinda Du   Encounter Date: 08/22/2016      PT End of Session - 08/22/16 1152    Visit Number 16   Number of Visits 18   Date for PT Re-Evaluation 08/29/16   Authorization Type Medicare Traditional; Gcodes done 08/03/16 (11th visit)   Authorization Time Period 6/96/29-11/13/39; re-cert done on 09/09/38   Authorization - Visit Number 16   Authorization - Number of Visits 21   PT Start Time 1121   PT Stop Time 1200   PT Time Calculation (min) 39 min   Activity Tolerance Patient tolerated treatment well;Patient limited by fatigue      Past Medical History:  Diagnosis Date  . Cancer [facility]) March 2016   Skin Cancer :  Right Cheek     SCC  . Cancer [facility]) 2013   Skin Cancer  :  Anterior Neck   SCC  . Carotid artery occlusion   . Diverticulitis   . DVT (deep venous thrombosis) ([place]) 07/14/10   LEA doppler   . Glaucoma   . Hearing loss   . HTN (hypertension)    cholesterol  . Lumbar spine pain   . Lymphedema   . Macular degeneration   . Pain in joints   . Precancerous changes of the cervix    lesions  . Rectocele   . SOB (shortness of breath) 04/04/2010   2D echo EF=>55%    Past Surgical History:  Procedure Laterality Date  . ABDOMINAL HYSTERECTOMY    . appendix    . bladder tac    . COLONOSCOPY  09/20/2011   Procedure: COLONOSCOPY;  Surgeon: Beryle Beams, MD;  Location: [facility] ENDOSCOPY;  Service: Endoscopy;  Laterality: N/A;  . corneal erosion     cataracts   . elevated metatarsal arch    . fallen uterus    . hemmorrhoidectomy    . left total knee replacement  05/02/04   [name]  . met osteostomy    . morton's nueroma    . OVARIAN CYST SURGERY    . RECTOCELE REPAIR    . rupture rt groin    .  salk    . SKIN CANCER EXCISION Right March 2016  2013   Cheek -  [facility]  and  Anterior Neck  . tracheotomy      There were no vitals filed for this visit.      Subjective Assessment - 08/22/16 1140    Subjective Pt reports her back feel bad today, but not painful. She thinks its the degenerative discs taking their toll.    Pertinent History History of Left TKA (10YA), neck pain, and Rt rotator cuff tear x2 with poor outcome after 9 recent OT visits (NOV 17).    Currently in Pain? No/denies  Pt does nto elaborate                         [facility] Adult PT Treatment/Exercise - 08/22/16 0001      Transfers   Five time sit to stand comments  Performed 3 sets of 5:  16.82s, 17.93s, 15.36s     Ambulation/Gait   Pre-Gait Activities 1lb ankle weights: RetroAMB: 3x3f, Side step 3x25fbilat; FWD high knee AMB 3x204f  Lumbar Exercises: Seated   Sit to Stand Limitations seated trunk/head rotation c ball: 3x5 bilat     Knee/Hip Exercises: Standing   Lateral Step Up Both;3 sets;Step Height: 4";Hand Hold: 0  2x8 bilat   Lateral Step Up Limitations c SPC and intermittent HHA prn   Forward Step Up Both;3 sets;Step Height: 4";Hand Hold: 0  2x8 bilat   Forward Step Up Limitations c SPC and intermittent HHA prn  unable to complete 2nd set due to time limitation/fatigue                  PT Short Term Goals - 08/08/16 1122      PT SHORT TERM GOAL #1   Title After 4 weeks patient will report ability to complete 90% of activities at home without limitations from low back pain.    Baseline 2/21- she has plenty of time to do what she needs at home, no back pain    Time 4   Period Weeks   Status Achieved     PT SHORT TERM GOAL #2   Title After 4 weeks patient will demonstrate improved functional movement and strength without guarding and fears inhibition AEB 5xSTS< 16 seconds.    Time 4   Period Weeks   Status On-going     PT SHORT TERM GOAL #3   Title After 4  weeks patient will demonstrate minimal falls risk in the home AEB BBT score of 48/56 or greater, a self selected gait speed of >1.57ms, and TUG<15sec.    Baseline 2/21- 21 on BERG, gait speed and TUG not tested    Time 4   Period Weeks   Status On-going                  Plan - 08/22/16 1156    Clinical Impression Statement Session with strong focus on CKC functional strengthenign for multidirectional gaiit and management of steps. Intermittent use of Single HHA to improve patient confidence and performacne throughtout. The patient requires significant rest breaks after each effort due to fatigue. 5xSTS times are faster this session compared to last, but overall performance decliens toward end of session, unabel to complete all planned activities due to fatigue. The patient demonstrates little carryover from session to session interms of retaining gait cues and and safety recommendations from session to session. Pt making progress toward goals overall.    Rehab Potential Fair   PT Frequency 2x / week   PT Duration 3 weeks   PT Treatment/Interventions ADLs/Self Care Home Management;Electrical Stimulation;Moist Heat;DME Instruction;Therapeutic activities;Therapeutic exercise;Balance training;Neuromuscular re-education;Patient/family education;Passive range of motion;Manual techniques;Dry needling   PT Next Visit Plan strong focus on functional balance/safety awareness, integrate SPC with balance activity and HHA as needed functional strength; ultimately patient is appropriate for referral to local senior center at [place].     PT Home Exercise Plan Eval: Left SKTC stretch 3x30sec TID; 02/28: bridges, S/L abduction, STS no UE and heel raises with UE support   Consulted and Agree with Plan of Care Patient      Patient will benefit from skilled therapeutic intervention in order to improve the following deficits and impairments:  Abnormal gait, Decreased coordination, Decreased range of motion,  Difficulty walking, Increased fascial restricitons, Impaired tone, Pain, Impaired UE functional use, Decreased knowledge of precautions, Hypomobility, Decreased mobility, Decreased strength, Postural dysfunction, Impaired flexibility, Decreased activity tolerance  Visit Diagnosis: Acute left-sided low back pain without sciatica  Muscle spasm of back  Difficulty in walking, not elsewhere  classified  Other symptoms and signs involving the musculoskeletal system     Problem List Patient Active Problem List   Diagnosis Date Noted  . Acute blood loss anemia 09/10/2014  . GIB (gastrointestinal bleeding) 09/08/2014  . Occlusion and stenosis of carotid artery without mention of cerebral infarction 12/14/2013  . Osteoarthritis of left knee 04/23/2013  . Thumb pain 10/22/2012  . De Quervain's syndrome (tenosynovitis) 10/22/2012  . [facility] DJD(carpometacarpal degenerative joint disease), localized primary 10/22/2012  . CTS (carpal tunnel syndrome) 10/22/2012  . Trigger thumb of right hand 10/22/2012  . Pes anserinus bursitis of right knee 06/26/2012  . OA (osteoarthritis) of knee 06/26/2012  . Leg pain, left 06/26/2012  . Radicular pain of left lower extremity 06/26/2012  . Diverticulosis of colon with hemorrhage 09/22/2011  . Syncopal episodes 09/20/2011  . GI bleed 09/19/2011  . HTN (hypertension) 09/19/2011  . Anemia due to blood loss, acute 09/19/2011  . Abnormality of gait 01/03/2011  . Personal history of fall 12/27/2010  . DISLOCATION CLOSED SHOULDER NEC 04/25/2010  . ANSERINE BURSITIS, LEFT 02/13/2010  . KNEE PAIN 12/22/2009  . LEG PAIN, BILATERAL 12/22/2009  . TOTAL KNEE FOLLOW-UP 12/22/2009  . HIP PAIN 02/07/2009  . LOW BACK PAIN 02/07/2009    12:04 PM, 08/22/16 Etta Grandchild, PT, DPT Physical Therapist at [place] [phone] (office)     [place] [address] [place], [place],  [address] Phone: [phone]   Fax:  [phone]  Name: Cindy Schultz MRN: [id number] Date of Birth: 12-13-1923

## 2016-08-27 ENCOUNTER — Ambulatory Visit (HOSPITAL_COMMUNITY): Payer: Medicare Other | Admitting: Physical Therapy

## 2016-08-29 ENCOUNTER — Ambulatory Visit (HOSPITAL_COMMUNITY): Payer: Medicare Other

## 2016-08-29 DIAGNOSIS — R29898 Other symptoms and signs involving the musculoskeletal system: Secondary | ICD-10-CM

## 2016-08-29 DIAGNOSIS — M62830 Muscle spasm of back: Secondary | ICD-10-CM | POA: Diagnosis not present

## 2016-08-29 DIAGNOSIS — M545 Low back pain, unspecified: Secondary | ICD-10-CM

## 2016-08-29 DIAGNOSIS — R262 Difficulty in walking, not elsewhere classified: Secondary | ICD-10-CM

## 2016-08-29 NOTE — Therapy (Signed)
PHYSICAL THERAPY DISCHARGE SUMMARY  Visits from Start of Care: 17  Current functional level related to goals / functional outcomes: *see below    Remaining deficits: *see below    Education / Equipment: *see below  Plan: Patient agrees to discharge.  Patient goals were partially met. Patient is being discharged due to lack of progress.  ?????   1:49 PM, 08/29/16 Etta Grandchild, PT, DPT Physical Therapist at [employer] [phone] (office)                    [place] [address] [place], [place], [address] Phone: [phone]   Fax:  [phone]  Physical Therapy Treatment  Patient Details  Name: Cindy Schultz MRN: [id number] Date of Birth: 01-01-1924 Referring Provider: Sinda Du   Encounter Date: 08/29/2016      PT End of Session - 08/29/16 1218    Visit Number 17   Number of Visits 18   Date for PT Re-Evaluation 08/29/16   Authorization Type Medicare Traditional; Gcodes done 08/03/16 (11th visit)   Authorization Time Period 5/83/09-09/23/66; re-cert done on 0/88/11   Authorization - Visit Number 17   Authorization - Number of Visits 21   PT Start Time 1117   PT Stop Time 1150   PT Time Calculation (min) 33 min   Equipment Utilized During Treatment Gait belt   Activity Tolerance Patient tolerated treatment well   Behavior During Therapy [facility] for tasks assessed/performed      Past Medical History:  Diagnosis Date  . Cancer [facility]) March 2016   Skin Cancer :  Right Cheek     SCC  . Cancer [facility]) 2013   Skin Cancer  :  Anterior Neck   SCC  . Carotid artery occlusion   . Diverticulitis   . DVT (deep venous thrombosis) ([place]) 07/14/10   LEA doppler   . Glaucoma   . Hearing loss   . HTN (hypertension)    cholesterol  . Lumbar spine pain   . Lymphedema   . Macular degeneration   . Pain in joints   . Precancerous changes of the cervix    lesions  . Rectocele    . SOB (shortness of breath) 04/04/2010   2D echo EF=>55%    Past Surgical History:  Procedure Laterality Date  . ABDOMINAL HYSTERECTOMY    . appendix    . bladder tac    . COLONOSCOPY  09/20/2011   Procedure: COLONOSCOPY;  Surgeon: Beryle Beams, MD;  Location: [facility] ENDOSCOPY;  Service: Endoscopy;  Laterality: N/A;  . corneal erosion     cataracts   . elevated metatarsal arch    . fallen uterus    . hemmorrhoidectomy    . left total knee replacement  05/02/04   [name]  . met osteostomy    . morton's nueroma    . OVARIAN CYST SURGERY    . RECTOCELE REPAIR    . rupture rt groin    . salk    . SKIN CANCER EXCISION Right March 2016  2013   Cheek -  [facility]  and  Anterior Neck  . tracheotomy      There were no vitals filed for this visit.          [facility] PT Assessment - 08/29/16 0001      Assessment   Medical Diagnosis Left sided low back pain s/p fall   Referring Provider Sinda Du  Hand Dominance Right  uses left often due to Rt RC tear and Rt     Next MD Visit none   Prior Therapy none     Strength   Right Hip Flexion 4-/5   Left Hip Flexion 4-/5   Right Knee Flexion 4/5   Right Knee Extension 3+/5   Left Knee Flexion 3+/5  very painful   Left Knee Extension 3+/5  very painful     Transfers   Five time sit to stand comments  15.29s, hands free  TUG: 20.57s s AD     Ambulation/Gait   Pre-Gait Activities 10MWT (maximal gait speed): 0.47ms     Berg Balance Test   Sit to Stand Able to stand without using hands and stabilize independently   Standing Unsupported Able to stand safely 2 minutes   Sitting with Back Unsupported but Feet Supported on Floor or Stool Able to sit safely and securely 2 minutes   Stand to Sit Sits safely with minimal use of hands   Transfers Able to transfer safely, minor use of hands   Standing Unsupported with Eyes Closed Able to stand 10 seconds safely   Standing Ubsupported with Feet Together Able to place feet together  independently and stand 1 minute safely   From Standing, Reach Forward with Outstretched Arm Can reach confidently >25 cm (10")   From Standing Position, Pick up Object from Floor Able to pick up shoe safely and easily   From Standing Position, Turn to Look Behind Over each Shoulder Turn sideways only but maintains balance   Turn 360 Degrees Able to turn 360 degrees safely but slowly   Standing Unsupported, Alternately Place Feet on Step/Stool Able to complete >2 steps/needs minimal assist   Standing Unsupported, One Foot in Front Able to take small step independently and hold 30 seconds   Standing on One Leg Tries to lift leg/unable to hold 3 seconds but remains standing independently   Total Score 44                               PT Short Term Goals - 08/29/16 1303      PT SHORT TERM GOAL #1   Title After 4 weeks patient will report ability to complete 90% of activities at home without limitations from low back pain.    Baseline 2/21- she has plenty of time to do what she needs at home, no back pain    Time 4   Period Weeks   Status Achieved     PT SHORT TERM GOAL #2   Title After 4 weeks patient will demonstrate improved functional movement and strength without guarding and fears inhibition AEB 5xSTS< 16 seconds.    Baseline 3/14: 5xsts: 15.29s   Time 4   Period Weeks   Status Achieved     PT SHORT TERM GOAL #3   Title After 4 weeks patient will demonstrate minimal falls risk in the home AEB BBT score of 48/56 or greater, a self selected gait speed of >1.07m, and TUG<15sec.    Baseline 3/14: BBT 44/56, Max gait speed is 0.697m TUG: 20.57s.    Time 4   Period Weeks   Status Not Met                  Plan - 08/29/16 1256    Clinical Impression Statement Pt reassessed today with signs of improvement in baance AEB BBT, functional stregnth  AEB 5xSTS, functional mobility AEB TUG test, and trunk mobility AEB improved trunk rotation A/ROM. The  patient has made little progress in strength AEB MMT, gait speed AEB 10MWT, gait quality, and safe use of AD. Gait speed of 0.5ms today, Berg Balance Score 44/56, as well as TUG: 20.57, and poor adherence to recommendations for AD use all demonstrate continued elevated falls risk. education given on safe practices for gait has little carryover from session to session. Pt has  a strong psychological set-point for where her fuunction, strength, adn pain should be, and often is self limited in challenging herself due to comments made to her regarding imaging in the past or misconceptions about prognoses associated with some of her diagnoses. When explaining how her limitations may play a roll in her falls risk, she relates all impaiment to her age, whereas these data are compared to age matched norms form health population. Pt has made good progress overall, fallen short of achieving some goals, and is ready for DC at this time.    Rehab Potential Fair   PT Treatment/Interventions ADLs/Self Care Home Management;Electrical Stimulation;Moist Heat;DME Instruction;Therapeutic activities;Therapeutic exercise;Balance training;Neuromuscular re-education;Patient/family education;Passive range of motion;Manual techniques;Dry needling   Consulted and Agree with Plan of Care Patient      Patient will benefit from skilled therapeutic intervention in order to improve the following deficits and impairments:  Abnormal gait, Decreased coordination, Decreased range of motion, Difficulty walking, Increased fascial restricitons, Impaired tone, Pain, Impaired UE functional use, Decreased knowledge of precautions, Hypomobility, Decreased mobility, Decreased strength, Postural dysfunction, Impaired flexibility, Decreased activity tolerance  Visit Diagnosis: Acute left-sided low back pain without sciatica  Muscle spasm of back  Difficulty in walking, not elsewhere classified  Other symptoms and signs involving the  musculoskeletal system       G-Codes - [id number]    Functional Assessment Tool Used (Outpatient Only) Clinical Judgment    Functional Limitation Mobility: Walking and moving around   Mobility: Walking and Moving Around Goal Status [phone] At least 20 percent but less than 40 percent impaired, limited or restricted   Mobility: Walking and Moving Around Discharge Status [phone] At least 20 percent but less than 40 percent impaired, limited or restricted      Problem List Patient Active Problem List   Diagnosis Date Noted  . Acute blood loss anemia 09/10/2014  . GIB (gastrointestinal bleeding) 09/08/2014  . Occlusion and stenosis of carotid artery without mention of cerebral infarction 12/14/2013  . Osteoarthritis of left knee 04/23/2013  . Thumb pain 10/22/2012  . De Quervain's syndrome (tenosynovitis) 10/22/2012  . [facility] localized primary 10/22/2012  . CTS (carpal tunnel syndrome) 10/22/2012  . Trigger thumb of right hand 10/22/2012  . Pes anserinus bursitis of right knee 06/26/2012  . OA (osteoarthritis) of knee 06/26/2012  . Leg pain, left 06/26/2012  . Radicular pain of left lower extremity 06/26/2012  . Diverticulosis of colon with hemorrhage 09/22/2011  . Syncopal episodes 09/20/2011  . GI bleed 09/19/2011  . HTN (hypertension) 09/19/2011  . Anemia due to blood loss, acute 09/19/2011  . Abnormality of gait 01/03/2011  . Personal history of fall 12/27/2010  . DISLOCATION CLOSED SHOULDER NEC 04/25/2010  . ANSERINE BURSITIS, LEFT 02/13/2010  . KNEE PAIN 12/22/2009  . LEG PAIN, BILATERAL 12/22/2009  . TOTAL KNEE FOLLOW-UP 12/22/2009  . HIP PAIN 02/07/2009  . LOW BACK PAIN 02/07/2009   1:49 PM, 003/20/2018AEtta Grandchild PT, DPT Physical Therapist [facility]  [facility] [phone] (office)     [place] [address] [place], [place], [address] Phone:  [phone]   Fax:  [phone]  Name: SARANYA HARLIN MRN: [id number] Date of Birth: 12/23/1923

## 2016-08-30 ENCOUNTER — Ambulatory Visit (HOSPITAL_COMMUNITY): Payer: Medicare Other | Admitting: Physical Therapy

## 2016-08-30 DIAGNOSIS — I10 Essential (primary) hypertension: Secondary | ICD-10-CM | POA: Diagnosis not present

## 2016-08-30 DIAGNOSIS — E559 Vitamin D deficiency, unspecified: Secondary | ICD-10-CM | POA: Diagnosis not present

## 2016-08-30 DIAGNOSIS — R809 Proteinuria, unspecified: Secondary | ICD-10-CM | POA: Diagnosis not present

## 2016-08-30 DIAGNOSIS — D509 Iron deficiency anemia, unspecified: Secondary | ICD-10-CM | POA: Diagnosis not present

## 2016-08-30 DIAGNOSIS — Z79899 Other long term (current) drug therapy: Secondary | ICD-10-CM | POA: Diagnosis not present

## 2016-08-30 DIAGNOSIS — N183 Chronic kidney disease, stage 3 (moderate): Secondary | ICD-10-CM | POA: Diagnosis not present

## 2016-09-03 ENCOUNTER — Ambulatory Visit (HOSPITAL_COMMUNITY): Payer: Medicare Other

## 2016-09-05 ENCOUNTER — Encounter (HOSPITAL_COMMUNITY): Payer: Medicare Other

## 2016-09-06 DIAGNOSIS — N183 Chronic kidney disease, stage 3 (moderate): Secondary | ICD-10-CM | POA: Diagnosis not present

## 2016-09-06 DIAGNOSIS — I120 Hypertensive chronic kidney disease with stage 5 chronic kidney disease or end stage renal disease: Secondary | ICD-10-CM | POA: Diagnosis not present

## 2016-09-06 DIAGNOSIS — M159 Polyosteoarthritis, unspecified: Secondary | ICD-10-CM | POA: Diagnosis not present

## 2016-09-06 DIAGNOSIS — I10 Essential (primary) hypertension: Secondary | ICD-10-CM | POA: Diagnosis not present

## 2016-09-11 ENCOUNTER — Ambulatory Visit (INDEPENDENT_AMBULATORY_CARE_PROVIDER_SITE_OTHER): Payer: Medicare Other | Admitting: Orthopedic Surgery

## 2016-09-11 ENCOUNTER — Encounter: Payer: Self-pay | Admitting: Orthopedic Surgery

## 2016-09-11 ENCOUNTER — Ambulatory Visit (INDEPENDENT_AMBULATORY_CARE_PROVIDER_SITE_OTHER): Payer: Medicare Other

## 2016-09-11 VITALS — BP 120/61 | HR 75 | Wt 130.0 lb

## 2016-09-11 DIAGNOSIS — M25561 Pain in right knee: Secondary | ICD-10-CM

## 2016-09-11 DIAGNOSIS — M5410 Radiculopathy, site unspecified: Secondary | ICD-10-CM

## 2016-09-11 MED ORDER — METHYLPREDNISOLONE ACETATE 40 MG/ML IJ SUSP
40.0000 mg | Freq: Once | INTRAMUSCULAR | Status: AC
  Administered 2016-09-11: 40 mg via INTRAMUSCULAR

## 2016-09-11 NOTE — Progress Notes (Signed)
Patient ID: Cindy Schultz, female   DOB: 1923-07-17, 83 y.o.   MRN: [id number]  Chief Complaint  Patient presents with  . Knee Pain    RIGHT KNEE PAIN    HPI Cindy Schultz is a 83 y.o. female.  Three-week history of intermittent episodic jabbing pain in the right knee, no history of,. Patient has history of lumbar disc disease  Upon further review of the patient's having back pain right hip pain right leg pain and pain referred to the knee.  His pain is sharp episodic not constant  No knee swelling is noted no loss of motion in the knee.  Past Medical History:  Diagnosis Date  . Cancer [facility]) March 2016   Skin Cancer :  Right Cheek     SCC  . Cancer [facility]) 2013   Skin Cancer  :  Anterior Neck   SCC  . Carotid artery occlusion   . Diverticulitis   . DVT (deep venous thrombosis) ([place]) 07/14/10   LEA doppler   . Glaucoma   . Hearing loss   . HTN (hypertension)    cholesterol  . Lumbar spine pain   . Lymphedema   . Macular degeneration   . Pain in joints   . Precancerous changes of the cervix    lesions  . Rectocele   . SOB (shortness of breath) 04/04/2010   2D echo EF=>55%    Past Surgical History:  Procedure Laterality Date  . ABDOMINAL HYSTERECTOMY    . appendix    . bladder tac    . COLONOSCOPY  09/20/2011   Procedure: COLONOSCOPY;  Surgeon: Beryle Beams, MD;  Location: [facility] ENDOSCOPY;  Service: Endoscopy;  Laterality: N/A;  . corneal erosion     cataracts   . elevated metatarsal arch    . fallen uterus    . hemmorrhoidectomy    . left total knee replacement  05/02/04   [name]  . met osteostomy    . morton's nueroma    . OVARIAN CYST SURGERY    . RECTOCELE REPAIR    . rupture rt groin    . salk    . SKIN CANCER EXCISION Right March 2016  2013   Cheek -  [facility]  and  Anterior Neck  . tracheotomy       Review of Systems Review of Systems  Constitutional: Negative for fever and unexpected weight change.  Gastrointestinal: Negative.    Genitourinary: Negative.    (2 MINIMUM)  Past Medical History:  Diagnosis Date  . Cancer [facility]) March 2016   Skin Cancer :  Right Cheek     SCC  . Cancer [facility] 2013   Skin Cancer  :  Anterior Neck   SCC  . Carotid artery occlusion   . Diverticulitis   . DVT (deep venous thrombosis) ([place]) 07/14/10   LEA doppler   . Glaucoma   . Hearing loss   . HTN (hypertension)    cholesterol  . Lumbar spine pain   . Lymphedema   . Macular degeneration   . Pain in joints   . Precancerous changes of the cervix    lesions  . Rectocele   . SOB (shortness of breath) 04/04/2010   2D echo EF=>55%    Past Surgical History:  Procedure Laterality Date  . ABDOMINAL HYSTERECTOMY    . appendix    . bladder tac    . COLONOSCOPY  09/20/2011   Procedure: COLONOSCOPY;  Surgeon: Beryle Beams, MD;  Location: [facility] ENDOSCOPY;  Service: Endoscopy;  Laterality: N/A;  . corneal erosion     cataracts   . elevated metatarsal arch    . fallen uterus    . hemmorrhoidectomy    . left total knee replacement  05/02/04   [name]  . met osteostomy    . morton's nueroma    . OVARIAN CYST SURGERY    . RECTOCELE REPAIR    . rupture rt groin    . salk    . SKIN CANCER EXCISION Right March 2016  2013   Cheek -  [facility]  and  Anterior Neck  . tracheotomy      Social History Social History  Substance Use Topics  . Smoking status: Never Smoker  . Smokeless tobacco: Never Used  . Alcohol use No    Allergies  Allergen Reactions  . Promethazine Hcl Nausea And Vomiting and Other (See Comments)    REACTION: deathly sick  . Codeine Nausea And Vomiting  . Tramadol Hcl Nausea And Vomiting and Other (See Comments)    Motion Sickness  . Hydrocodone Other (See Comments)    Unknown Reaction     Current Meds  Medication Sig  . acetaminophen (TYLENOL) 500 MG tablet Take 500 mg by mouth every 6 (six) hours as needed (pain).   . Calcium 600-200 MG-UNIT per tablet Take 1 tablet by mouth daily.    .  dorzolamide-timolol (COSOPT) 22.3-6.8 MG/ML ophthalmic solution Place 1 drop into both eyes 2 (two) times daily.    . ferrous sulfate 325 (65 FE) MG tablet Take 325 mg by mouth daily with breakfast. Reported on 12/08/2015  . fluticasone (FLONASE) 50 MCG/ACT nasal spray Place 2 sprays into the nose daily.    Marland Kitchen gabapentin (NEURONTIN) 100 MG capsule Take 1 capsule (100 mg total) by mouth at bedtime.  Marland Kitchen glucosamine-chondroitin 500-400 MG tablet Take 1 tablet by mouth daily.   Marland Kitchen latanoprost (XALATAN) 0.005 % ophthalmic solution Place 1 drop into the right eye at bedtime.   Marland Kitchen losartan (COZAAR) 50 MG tablet   . LUMIGAN 0.01 % SOLN   . Multiple Vitamins-Minerals (OCUVITE PO) Take 1 tablet by mouth daily.  Marland Kitchen NIFEdipine (PROCARDIA-XL/ADALAT-CC/NIFEDICAL-XL) 30 MG 24 hr tablet Take 1 tablet by mouth daily.  Marland Kitchen oxybutynin (DITROPAN-XL) 5 MG 24 hr tablet Take 5 mg by mouth daily.  Vladimir Faster Glycol-Propyl Glycol (SYSTANE ULTRA OP) Apply 1 drop to eye 3 (three) times daily.   . promethazine (PHENERGAN) 12.5 MG tablet Take 1 tablet (12.5 mg total) by mouth every 6 (six) hours as needed for nausea or vomiting.  . traMADol (ULTRAM) 50 MG tablet Take 1 tablet (50 mg total) by mouth every 6 (six) hours as needed.      Physical Exam Physical Exam 1.BP 120/61   Pulse 75   Wt 130 lb (59 kg)   BMI 23.78 kg/m   2. Gen. appearance. The patient is well-developed and well-nourished, grooming and hygiene are normal. There are no gross congenital abnormalities  3. The patient is alert and oriented to person place and time  4. Mood and affect are normal  5. Ambulation SLOW BUT NO LIMP  Examination reveals the following: 6. On inspection we find RIGHT KNEE tenderness medially laterally, GERDYS  tubercle. Right lateral leg right hip right buttock lower back.  7. With the range of motion of  knee is normal  8. Stability tests were normal  right knee  9. Strength tests revealed grade 5 motor strength  in both  legs  10. Skin we find no rash ulceration or erythema both lower extremities  11. Sensation remains intact both lower legs  12 Vascular system shows no peripheral edema both ankle areas  Back tenderness  She was actually tender everywhere except her left arm and hand  MEDICAL DECISION MAKING:    Data Reviewed X-ray shows arthritis of the right knee without fracture  Assessment  Verbal consent, timeout completed. Ethyl chloride and alcohol used to prep the skin Intramuscular injection right buttock  Depo-Medrol 40 lidocaine 3 mL 1%. No complications   Plan IM INJECTION FU 3 WEEKS   Arther Abbott 09/11/2016, 3:39 PM

## 2016-09-12 ENCOUNTER — Telehealth: Payer: Self-pay | Admitting: Orthopedic Surgery

## 2016-09-12 NOTE — Telephone Encounter (Signed)
Patient left message on voicemail stating that her pharmacy [facility] [address] has not received the prescription that Dr. [name] told her he would send in for her.  Please advise

## 2016-09-12 NOTE — Telephone Encounter (Signed)
I do not see that any medication was sent or mention of it in note

## 2016-09-14 DIAGNOSIS — R809 Proteinuria, unspecified: Secondary | ICD-10-CM | POA: Diagnosis not present

## 2016-09-14 DIAGNOSIS — N183 Chronic kidney disease, stage 3 (moderate): Secondary | ICD-10-CM | POA: Diagnosis not present

## 2016-09-18 NOTE — Telephone Encounter (Signed)
NO OTHER MEDS I GAVE HER THE SHOT

## 2016-09-20 DIAGNOSIS — H401134 Primary open-angle glaucoma, bilateral, indeterminate stage: Secondary | ICD-10-CM | POA: Diagnosis not present

## 2016-09-20 DIAGNOSIS — H3530 Unspecified macular degeneration: Secondary | ICD-10-CM | POA: Diagnosis not present

## 2016-09-20 DIAGNOSIS — H16223 Keratoconjunctivitis sicca, not specified as Sjogren's, bilateral: Secondary | ICD-10-CM | POA: Diagnosis not present

## 2016-09-28 DIAGNOSIS — H401114 Primary open-angle glaucoma, right eye, indeterminate stage: Secondary | ICD-10-CM | POA: Diagnosis not present

## 2016-10-04 ENCOUNTER — Ambulatory Visit (INDEPENDENT_AMBULATORY_CARE_PROVIDER_SITE_OTHER): Payer: Medicare Other | Admitting: Otolaryngology

## 2016-10-04 DIAGNOSIS — H903 Sensorineural hearing loss, bilateral: Secondary | ICD-10-CM

## 2016-10-08 ENCOUNTER — Ambulatory Visit (INDEPENDENT_AMBULATORY_CARE_PROVIDER_SITE_OTHER): Payer: Medicare Other | Admitting: Orthopedic Surgery

## 2016-10-08 ENCOUNTER — Encounter: Payer: Self-pay | Admitting: Orthopedic Surgery

## 2016-10-08 DIAGNOSIS — M5410 Radiculopathy, site unspecified: Secondary | ICD-10-CM | POA: Diagnosis not present

## 2016-10-08 DIAGNOSIS — G5601 Carpal tunnel syndrome, right upper limb: Secondary | ICD-10-CM

## 2016-10-08 DIAGNOSIS — M180 Bilateral primary osteoarthritis of first carpometacarpal joints: Secondary | ICD-10-CM | POA: Diagnosis not present

## 2016-10-08 DIAGNOSIS — Z96652 Presence of left artificial knee joint: Secondary | ICD-10-CM | POA: Diagnosis not present

## 2016-10-08 NOTE — Progress Notes (Signed)
FOLLOW UP VISIT   Patient ID: Cindy Schultz, female   DOB: Nov 30, 1923, 83 y.o.   MRN: [id number]  Chief Complaint  Patient presents with  . Follow-up    3 week recheck on right leg pain.    HPI Cindy Schultz is a 83 y.o. female.   HPI  83 year-old female treated with IM injection for radicular pain in the right leg she also has osteoarthritis of the right knee  She has several complaints today 1 her left thumb is bothering her with aching pain  She has numbness in the right hand and drops things she is artery wearing a carpal tunnel splint in a thumb splint for the arthritis in the right thumb  She has pain over the lateral side of the left leg after falling.  She has a bruise over the right leg secondary to a fall  Review of Systems Review of Systems  Currently has no chest pain shortness of breath right leg has improved   Physical Exam  Start with the right leg  She has a hematoma and bruising of the right anterior compartment without tenderness or tightness of the compartment and negative compartment stress testing.  Left thumb is prominent at the [facility] joint with tenderness and crepitance decreased pinch strength  Left knee well-healed incision over the left knee from a knee replacement the knee is stable she has functional range of motion of her [place]   No other data  Encounter Diagnoses  Name Primary?  . Radicular leg pain Yes  . History of left knee replacement   . Carpal tunnel syndrome of right wrist   . Primary osteoarthritis of both first carpometacarpal joints       PLAN(RISK)    The patient will follow as needed with symptomatic treatment of the above-mentioned problems

## 2016-10-10 DIAGNOSIS — L570 Actinic keratosis: Secondary | ICD-10-CM | POA: Diagnosis not present

## 2016-10-10 DIAGNOSIS — D225 Melanocytic nevi of trunk: Secondary | ICD-10-CM | POA: Diagnosis not present

## 2016-10-10 DIAGNOSIS — X32XXXD Exposure to sunlight, subsequent encounter: Secondary | ICD-10-CM | POA: Diagnosis not present

## 2016-10-19 DIAGNOSIS — Z0000 Encounter for general adult medical examination without abnormal findings: Secondary | ICD-10-CM | POA: Diagnosis not present

## 2016-10-24 DIAGNOSIS — I739 Peripheral vascular disease, unspecified: Secondary | ICD-10-CM | POA: Diagnosis not present

## 2016-10-31 DIAGNOSIS — H401134 Primary open-angle glaucoma, bilateral, indeterminate stage: Secondary | ICD-10-CM | POA: Diagnosis not present

## 2016-11-19 ENCOUNTER — Ambulatory Visit (INDEPENDENT_AMBULATORY_CARE_PROVIDER_SITE_OTHER): Payer: Medicare Other | Admitting: Orthopedic Surgery

## 2016-11-19 ENCOUNTER — Encounter: Payer: Self-pay | Admitting: Orthopedic Surgery

## 2016-11-19 DIAGNOSIS — M5442 Lumbago with sciatica, left side: Secondary | ICD-10-CM | POA: Diagnosis not present

## 2016-11-19 MED ORDER — METHYLPREDNISOLONE ACETATE 40 MG/ML IJ SUSP
40.0000 mg | Freq: Once | INTRAMUSCULAR | Status: AC
Start: 2016-11-19 — End: 2016-11-19
  Administered 2016-11-19: 40 mg via INTRAMUSCULAR

## 2016-11-19 MED ORDER — PROMETHAZINE HCL 12.5 MG PO TABS: 12.5000 mg | ORAL_TABLET | Freq: Four times a day (QID) | ORAL | 2 refills | Status: DC | PRN

## 2016-11-19 NOTE — Addendum Note (Signed)
Addended by: Baldomero Lamy B on: 11/19/2016 03:51 PM   Modules accepted: Orders

## 2016-11-19 NOTE — Progress Notes (Signed)
Patient ID: Cindy Schultz, female   DOB: 05/22/1924, 83 y.o.   MRN: [id number]  Chief Complaint  Patient presents with  . Follow-up    LEFT HIP AND RADICULAR LEG PAIN    HPI Cindy Schultz is a 83 y.o. female.   83 year old female presents with new onset of pain lower back left hip and leg down to her foot. We saw her a month or so ago for right lower back pain with radicular symptoms.  She complains of a weak feeling in the left leg with lower back pain and occasional giving way symptoms. Pain is dull but occasionally she gets a sharp stabbing pain which she feels might throw her down. It is dull aching and intermittent in terms of intensity    Review of Systems Review of Systems  Constitutional: Negative for appetite change, fever and unexpected weight change.  Respiratory: Negative for shortness of breath.   Cardiovascular: Negative for chest pain.  Neurological: Positive for weakness. Negative for numbness.   (2 MINIMUM)  Past Medical History:  Diagnosis Date  . Cancer [facility]) March 2016   Skin Cancer :  Right Cheek     SCC  . Cancer [facility]) 2013   Skin Cancer  :  Anterior Neck   SCC  . Carotid artery occlusion   . Diverticulitis   . DVT (deep venous thrombosis) ([place]) 07/14/10   LEA doppler   . Glaucoma   . Hearing loss   . HTN (hypertension)    cholesterol  . Lumbar spine pain   . Lymphedema   . Macular degeneration   . Pain in joints   . Precancerous changes of the cervix    lesions  . Rectocele   . SOB (shortness of breath) 04/04/2010   2D echo EF=>55%    Past Surgical History:  Procedure Laterality Date  . ABDOMINAL HYSTERECTOMY    . appendix    . bladder tac    . COLONOSCOPY  09/20/2011   Procedure: COLONOSCOPY;  Surgeon: Beryle Beams, MD;  Location: [facility] ENDOSCOPY;  Service: Endoscopy;  Laterality: N/A;  . corneal erosion     cataracts   . elevated metatarsal arch    . fallen uterus    . hemmorrhoidectomy    . left total knee replacement  05/02/04    [name]  . met osteostomy    . morton's nueroma    . OVARIAN CYST SURGERY    . RECTOCELE REPAIR    . rupture rt groin    . salk    . SKIN CANCER EXCISION Right March 2016  2013   Cheek -  [facility]  and  Anterior Neck  . tracheotomy      Social History Social History  Substance Use Topics  . Smoking status: Never Smoker  . Smokeless tobacco: Never Used  . Alcohol use No    Allergies  Allergen Reactions  . Promethazine Hcl Nausea And Vomiting and Other (See Comments)    REACTION: deathly sick  . Codeine Nausea And Vomiting  . Tramadol Hcl Nausea And Vomiting and Other (See Comments)    Motion Sickness  . Hydrocodone Other (See Comments)    Unknown Reaction     No outpatient prescriptions have been marked as taking for the 11/19/16 encounter (Office Visit) with Carole Civil, MD.      Physical Exam Physical Exam 1.There were no vitals taken for this visit.  2. Gen. appearance. The patient is well-developed and well-nourished, grooming and  hygiene are normal. There are no gross congenital abnormalities  3. The patient is alert and oriented to person place and time  4. Mood and affect are normal  5. Ambulation Normal but slow no limp is noted  Examination reveals the following: 6. On inspection we find tenderness in the left lower back left leg upper and lower  7. With the range of motion of  decreased flexion extension in the back normal hip range of motion on the left  8. Stability tests were normal  in the left leg  9. Strength tests revealed grade 5 motor strength  10. Skin we find no rash ulceration or erythema  11. Sensation remains intact  12 Vascular system shows no peripheral edema    MEDICAL DECISION MAKING:    Data Reviewed   Assessment Back pain with leg pain left leg  Plan IM shot left hip  Refill promethazine  IM SHOT ADMINISTERED BY THE NURSE  IM SHOT left hip  Medication  Depo-Medrol 40 MG   Lidocaine 1% 3 mL  Verbal  consent was obtained   timeout was executed to confirm injection site  Alcohol and ethyl chloride were used and the nurse injected the left hip   Arther Abbott 11/19/2016, 9:10 AM

## 2016-12-26 ENCOUNTER — Ambulatory Visit (INDEPENDENT_AMBULATORY_CARE_PROVIDER_SITE_OTHER): Payer: Medicare Other | Admitting: Ophthalmology

## 2016-12-26 ENCOUNTER — Encounter: Payer: Self-pay | Admitting: Family

## 2016-12-26 DIAGNOSIS — H35371 Puckering of macula, right eye: Secondary | ICD-10-CM | POA: Diagnosis not present

## 2016-12-26 DIAGNOSIS — H353231 Exudative age-related macular degeneration, bilateral, with active choroidal neovascularization: Secondary | ICD-10-CM | POA: Diagnosis not present

## 2016-12-26 DIAGNOSIS — H35033 Hypertensive retinopathy, bilateral: Secondary | ICD-10-CM

## 2016-12-26 DIAGNOSIS — I10 Essential (primary) hypertension: Secondary | ICD-10-CM

## 2016-12-26 DIAGNOSIS — H43813 Vitreous degeneration, bilateral: Secondary | ICD-10-CM | POA: Diagnosis not present

## 2017-01-04 DIAGNOSIS — R809 Proteinuria, unspecified: Secondary | ICD-10-CM | POA: Diagnosis not present

## 2017-01-04 DIAGNOSIS — Z79899 Other long term (current) drug therapy: Secondary | ICD-10-CM | POA: Diagnosis not present

## 2017-01-04 DIAGNOSIS — N183 Chronic kidney disease, stage 3 (moderate): Secondary | ICD-10-CM | POA: Diagnosis not present

## 2017-01-04 DIAGNOSIS — D509 Iron deficiency anemia, unspecified: Secondary | ICD-10-CM | POA: Diagnosis not present

## 2017-01-04 DIAGNOSIS — E559 Vitamin D deficiency, unspecified: Secondary | ICD-10-CM | POA: Diagnosis not present

## 2017-01-04 DIAGNOSIS — I10 Essential (primary) hypertension: Secondary | ICD-10-CM | POA: Diagnosis not present

## 2017-01-07 ENCOUNTER — Ambulatory Visit (INDEPENDENT_AMBULATORY_CARE_PROVIDER_SITE_OTHER)
Admission: RE | Admit: 2017-01-07 | Discharge: 2017-01-07 | Disposition: A | Discharge status: Home / Self Care | Payer: Medicare Other | Source: Ambulatory Visit | Attending: Surgery | Admitting: Surgery

## 2017-01-07 ENCOUNTER — Ambulatory Visit (HOSPITAL_COMMUNITY)
Admission: RE | Admit: 2017-01-07 | Discharge: 2017-01-07 | Disposition: A | Discharge status: Home / Self Care | Payer: Medicare Other | Source: Ambulatory Visit | Attending: Surgery | Admitting: Surgery

## 2017-01-07 ENCOUNTER — Encounter: Payer: Self-pay | Admitting: Family

## 2017-01-07 ENCOUNTER — Ambulatory Visit (INDEPENDENT_AMBULATORY_CARE_PROVIDER_SITE_OTHER): Payer: Medicare Other | Admitting: Family

## 2017-01-07 VITALS — BP 145/83 | HR 63 | Temp 97.2°F | Resp 18 | Ht 62.0 in | Wt 126.0 lb

## 2017-01-07 DIAGNOSIS — I6523 Occlusion and stenosis of bilateral carotid arteries: Secondary | ICD-10-CM | POA: Diagnosis not present

## 2017-01-07 DIAGNOSIS — I779 Disorder of arteries and arterioles, unspecified: Secondary | ICD-10-CM | POA: Diagnosis not present

## 2017-01-07 DIAGNOSIS — R0989 Other specified symptoms and signs involving the circulatory and respiratory systems: Secondary | ICD-10-CM | POA: Insufficient documentation

## 2017-01-07 LAB — VAS US CAROTID
LCCADDIAS: -17 cm/s
LCCADSYS: -59 cm/s
LCCAPDIAS: 19 cm/s
LCCAPSYS: 90 cm/s
Left ICA dist dias: -31 cm/s
Left ICA dist sys: -92 cm/s
Left ICA prox dias: -34 cm/s
Left ICA prox sys: -140 cm/s
RCCADSYS: -85 cm/s
RIGHT CCA MID DIAS: 17 cm/s
RIGHT ECA DIAS: -6 cm/s
Right CCA prox dias: 14 cm/s
Right CCA prox sys: 67 cm/s

## 2017-01-07 NOTE — Progress Notes (Signed)
Chief Complaint: Follow up Extracranial Carotid Artery Stenosis   History of Present Illness  Cindy Schultz is a 83 y.o. female patient whom Dr. Trula Slade initially evaluated in June 2015. She was referred for carotid plaque. The patient has a history of hoarseness as well as a sinus disease. She underwent a CT scan which revealed a severely calcified plaque at the left carotid bifurcation. The patient denies a history of symptoms of a stroke or TIA. Specifically she denies numbness or weakness in either extremity. She denies slurred speech. She denies amaurosis fugax. Based on the ultrasound at the patient's June 2015 visit, Dr. Trula Slade did not think there was a hemodynamically significant stenosis at the left common carotid artery where the plaque was identified. Dr. Trula Slade did think she should be followed on an annual basis to make sure that these velocity profiles remained consistent. Certainly should she develop symptoms, she may require a more invasive test such as a CT angiogram or catheter-based angiography.Dr. Trula Slade did not think either was required at that time as she remained asymptomatic. Greater than 80% stenosis would be the threshold for intervention.  Patient has a history of DVT which was treated with 6 months of Coumadin. She is medically managed for hypercholesterolemia and hypertension. She is a nonsmoker.  Patient has not had previous carotid artery intervention.  The patient reports New Medical or Surgical History: saw her podiatrist in June or July 2017 for a left hammertoe; states the podiatrist told her she had no pulse in her left foot and therefore she could not her her hammertoe repaired. She had speech therapy for her hoarseness.  In 2013 she had a vaginal hysterectomy for uterine prolapse and left inguinal herniorrhaphy.   She does not seem to have claudication symptoms with walking, her legs do get tired with walking a moderate distance,  but does have right leg cramping at night.  Pt Diabetic: no Pt smoker: non-smoker  Pt meds include: Statin : no, unknown why she is not taking a statin,  states she had too much hair loss with Welchol use, is now taking an OTC supplement for her cholesterol and she states her cholesterol has been OK. ASA: no, she cannot take ASA due to history of GI bleed from her colon Other anticoagulants/antiplatelets: no    Past Medical History:  Diagnosis Date  . Cancer [facility]) March 2016   Skin Cancer :  Right Cheek     SCC  . Cancer [facility]) 2013   Skin Cancer  :  Anterior Neck   SCC  . Carotid artery occlusion   . Diverticulitis   . DVT (deep venous thrombosis) ([place]) 07/14/10   LEA doppler   . Glaucoma   . Hearing loss   . HTN (hypertension)    cholesterol  . Lumbar spine pain   . Lymphedema   . Macular degeneration   . Pain in joints   . Precancerous changes of the cervix    lesions  . Rectocele   . SOB (shortness of breath) 04/04/2010   2D echo EF=>55%    Social History Social History  Substance Use Topics  . Smoking status: Never Smoker  . Smokeless tobacco: Never Used  . Alcohol use No    Family History Family History  Problem Relation Age of Onset  . Stroke Mother   . Diabetes Mother   . Heart attack Father   . COPD Paternal Grandfather   . Dementia Sister     Surgical History  Past Surgical History:  Procedure Laterality Date  . ABDOMINAL HYSTERECTOMY    . appendix    . bladder tac    . COLONOSCOPY  09/20/2011   Procedure: COLONOSCOPY;  Surgeon: Beryle Beams, MD;  Location: [facility] ENDOSCOPY;  Service: Endoscopy;  Laterality: N/A;  . corneal erosion     cataracts   . elevated metatarsal arch    . fallen uterus    . hemmorrhoidectomy    . left total knee replacement  05/02/04   [name]  . met osteostomy    . morton's nueroma    . OVARIAN CYST SURGERY    . RECTOCELE REPAIR    . rupture rt groin    . salk    . SKIN CANCER EXCISION Right March 2016  2013    Cheek -  [facility]  and  Anterior Neck  . tracheotomy      Allergies  Allergen Reactions  . Promethazine Hcl Nausea And Vomiting and Other (See Comments)    REACTION: deathly sick  . Codeine Nausea And Vomiting  . Tramadol Hcl Nausea And Vomiting and Other (See Comments)    Motion Sickness  . Hydrocodone Other (See Comments)    Unknown Reaction     Current Outpatient Prescriptions  Medication Sig Dispense Refill  . acetaminophen (TYLENOL) 500 MG tablet Take 500 mg by mouth every 6 (six) hours as needed (pain).     . Calcium 600-200 MG-UNIT per tablet Take 1 tablet by mouth daily.      . dorzolamide-timolol (COSOPT) 22.3-6.8 MG/ML ophthalmic solution Place 1 drop into both eyes 2 (two) times daily.      . ferrous sulfate 325 (65 FE) MG tablet Take 325 mg by mouth daily with breakfast. Reported on 12/08/2015    . fluticasone (FLONASE) 50 MCG/ACT nasal spray Place 2 sprays into the nose daily.      Marland Kitchen glucosamine-chondroitin 500-400 MG tablet Take 1 tablet by mouth daily.     Marland Kitchen latanoprost (XALATAN) 0.005 % ophthalmic solution Place 1 drop into the right eye at bedtime.     Marland Kitchen losartan (COZAAR) 50 MG tablet     . LUMIGAN 0.01 % SOLN     . Multiple Vitamins-Minerals (OCUVITE PO) Take 1 tablet by mouth daily.    Marland Kitchen NIFEdipine (PROCARDIA-XL/ADALAT-CC/NIFEDICAL-XL) 30 MG 24 hr tablet Take 1 tablet by mouth daily.    Marland Kitchen oxybutynin (DITROPAN-XL) 10 MG 24 hr tablet Take 10 mg by mouth daily.     Vladimir Faster Glycol-Propyl Glycol (SYSTANE ULTRA OP) Apply 1 drop to eye 3 (three) times daily.     . promethazine (PHENERGAN) 12.5 MG tablet Take 1 tablet (12.5 mg total) by mouth every 6 (six) hours as needed for nausea or vomiting. 60 tablet 2  . traMADol (ULTRAM) 50 MG tablet Take 1 tablet (50 mg total) by mouth every 6 (six) hours as needed. 60 tablet 5  . gabapentin (NEURONTIN) 100 MG capsule Take 1 capsule (100 mg total) by mouth at bedtime. (Patient not taking: Reported on 01/07/2017) 30 capsule 5   No  current facility-administered medications for this visit.     Review of Systems : See HPI for pertinent positives and negatives.  Physical Examination  Vitals:   01/07/17 1205 01/07/17 1208  BP: (!) 156/82 (!) 145/83  Pulse: 63   Resp: 18   Temp: (!) 97.2 F (36.2 C)   TempSrc: Oral   SpO2: 97%   Weight: 126 lb (57.2 kg)   Height: 5'  2" (1.575 m)    Body mass index is 23.05 kg/m.  General: WDWN elderly female in NAD GAIT: slow and deliberate Eyes: PERRLA Pulmonary: Respirations are non-labored, CTAB, no rales, rhonchi, or wheezing.  Cardiac: regular rhythm and rate, no detected murmur.  VASCULAR EXAM Carotid Bruits Right Left   Negative Negative   Aorta is not palpable. Radial pulses are 2+ palpable and equal.      LE Pulses Right Left       FEMORAL 3+ palpable 3+ palpable   POPLITEAL not palpable  not palpable       PT  1+ palpable  not palpable       DP  2+ palpable  1+ palpable    Gastrointestinal: soft, nontender, BS WNL, no r/g, no palpable masses.  Musculoskeletal: age appropriate muscle atrophy/wasting. M/S 4/5 throughout, extremities without ischemic changes.  Neurologic: A&O X 3; Appropriate Affect, Speech is normal CN 2-12 intact except is hard of hearing, pain and light touch intact in extremities, Motor exam as listed above. Loquacious.     Assessment: COLLENE MASSIMINO is a 83 y.o. female who has no history of stroke or TIA. Carotid stenosis was found on CT imaging as part of an evaluation for hoarseness and sinus disease.  Her atherosclerotic risk factors include advanced age, hypertension, and family history of stroke and CAD. Fortunately she does not have DM and has never used tobacco.  Pt states she could not have hammertoe surgery on a left hammertoe since her podiatrist could not  feel pulses in her left foot.  Pt has 1+ palpable left DP pulse, left PT pulse is not palpable; she has 2+ DP palpable pulse in her right foot.    Pt does not seem to have claudication symptoms with walking, has no signs of ischemia in her feet/legs.   DATA  Carotid Duplex (01/07/17): Less than 40% right  carotid artery stenosis, calcific plaque may obscure higher velocity <40% left internal carotid artery stenosis. Bilateral vertebral artery flow is antegrade.  Bilateral subclavian artery waveforms are normal.  Unchanged since  since exams of 12/27/2014 and 01/02/16.   ABI (Date: 01/07/2017)  R:   ABI: 1.03 (no previous),   PT: tri  DP: tri  TBI:  0.58, toe pressure: 92  L:   ABI: 0.94    PT: bi  DP: tri  TBI: 0.53, toe pressure: 85  Normal arterial perfusion in right lower extremity, mild arterial occlusive disease in left lower extremity.    Plan: Follow-up in 1 year with Carotid Duplex scan.  Since she feels unsteady on her feet with walking, daily seated leg exercises demonstrated and discussed to address her mild PAD.    I discussed in depth with the patient the nature of atherosclerosis, and emphasized the importance of maximal medical management including strict control of blood pressure, blood glucose, and lipid levels, obtaining regular exercise, and continued cessation of smoking.  The patient is aware that without maximal medical management the underlying atherosclerotic disease process will progress, limiting the benefit of any interventions. The patient was given information about stroke prevention and what symptoms should prompt the patient to seek immediate medical care. Thank you for allowing [place] to participate in this patient's care.  Clemon Chambers, RN, MSN, FNP-C [facility] Office: [phone]  Clinic Physician: Trula Slade  01/07/17 12:31 PM

## 2017-01-07 NOTE — Patient Instructions (Addendum)
Stroke Prevention Some medical conditions and behaviors are associated with an increased chance of having a stroke. You may prevent a stroke by making healthy choices and managing medical conditions. How can I reduce my risk of having a stroke?  Stay physically active. Get at least 30 minutes of activity on most or all days.  Do not smoke. It may also be helpful to avoid exposure to secondhand smoke.  Limit alcohol use. Moderate alcohol use is considered to be:  No more than 2 drinks per day for men.  No more than 1 drink per day for nonpregnant women.  Eat healthy foods. This involves:  Eating 5 or more servings of fruits and vegetables a day.  Making dietary changes that address high blood pressure (hypertension), high cholesterol, diabetes, or obesity.  Manage your cholesterol levels.  Making food choices that are high in fiber and low in saturated fat, trans fat, and cholesterol may control cholesterol levels.  Take any prescribed medicines to control cholesterol as directed by your health care provider.  Manage your diabetes.  Controlling your carbohydrate and sugar intake is recommended to manage diabetes.  Take any prescribed medicines to control diabetes as directed by your health care provider.  Control your hypertension.  Making food choices that are low in salt (sodium), saturated fat, trans fat, and cholesterol is recommended to manage hypertension.  Ask your health care provider if you need treatment to lower your blood pressure. Take any prescribed medicines to control hypertension as directed by your health care provider.  If you are 18-39 years of age, have your blood pressure checked every 3-5 years. If you are 40 years of age or older, have your blood pressure checked every year.  Maintain a healthy weight.  Reducing calorie intake and making food choices that are low in sodium, saturated fat, trans fat, and cholesterol are recommended to manage  weight.  Stop drug abuse.  Avoid taking birth control pills.  Talk to your health care provider about the risks of taking birth control pills if you are over 35 years old, smoke, get migraines, or have ever had a blood clot.  Get evaluated for sleep disorders (sleep apnea).  Talk to your health care provider about getting a sleep evaluation if you snore a lot or have excessive sleepiness.  Take medicines only as directed by your health care provider.  For some people, aspirin or blood thinners (anticoagulants) are helpful in reducing the risk of forming abnormal blood clots that can lead to stroke. If you have the irregular heart rhythm of atrial fibrillation, you should be on a blood thinner unless there is a good reason you cannot take them.  Understand all your medicine instructions.  Make sure that other conditions (such as anemia or atherosclerosis) are addressed. Get help right away if:  You have sudden weakness or numbness of the face, arm, or leg, especially on one side of the body.  Your face or eyelid droops to one side.  You have sudden confusion.  You have trouble speaking (aphasia) or understanding.  You have sudden trouble seeing in one or both eyes.  You have sudden trouble walking.  You have dizziness.  You have a loss of balance or coordination.  You have a sudden, severe headache with no known cause.  You have new chest pain or an irregular heartbeat. Any of these symptoms may represent a serious problem that is an emergency. Do not wait to see if the symptoms will go away.   Get medical help at once. Call your local emergency services (911 in [place] Do not drive yourself to the hospital.  This information is not intended to replace advice given to you by your health care provider. Make sure you discuss any questions you have with your health care provider. Document Released: 07/12/2004 Document Revised: 11/10/2015 Document Reviewed: 12/05/2012 Elsevier  Interactive Patient Education  2017 Elsevier Inc.      Peripheral Vascular Disease Peripheral vascular disease (PVD) is a disease of the blood vessels that are not part of your heart and brain. A simple term for PVD is poor circulation. In most cases, PVD narrows the blood vessels that carry blood from your heart to the rest of your body. This can result in a decreased supply of blood to your arms, legs, and internal organs, like your stomach or kidneys. However, it most often affects a person's lower legs and feet. There are two types of PVD.  Organic PVD. This is the more common type. It is caused by damage to the structure of blood vessels.  Functional PVD. This is caused by conditions that make blood vessels contract and tighten (spasm). Without treatment, PVD tends to get worse over time. PVD can also lead to acute ischemic limb. This is when an arm or limb suddenly has trouble getting enough blood. This is a medical emergency. Follow these instructions at home:  Take medicines only as told by your doctor.  Do not use any tobacco products, including cigarettes, chewing tobacco, or electronic cigarettes. If you need help quitting, ask your doctor.  Lose weight if you are overweight, and maintain a healthy weight as told by your doctor.  Eat a diet that is low in fat and cholesterol. If you need help, ask your doctor.  Exercise regularly. Ask your doctor for some good activities for you.  Take good care of your feet.  Wear comfortable shoes that fit well.  Check your feet often for any cuts or sores. Contact a doctor if:  You have cramps in your legs while walking.  You have leg pain when you are at rest.  You have coldness in a leg or foot.  Your skin changes.  You are unable to get or have an erection (erectile dysfunction).  You have cuts or sores on your feet that are not healing. Get help right away if:  Your arm or leg turns cold and blue.  Your arms or legs  become red, warm, swollen, painful, or numb.  You have chest pain or trouble breathing.  You suddenly have weakness in your face, arm, or leg.  You become very confused or you cannot speak.  You suddenly have a very bad headache.  You suddenly cannot see. This information is not intended to replace advice given to you by your health care provider. Make sure you discuss any questions you have with your health care provider. Document Released: 08/29/2009 Document Revised: 11/10/2015 Document Reviewed: 11/12/2013 Elsevier Interactive Patient Education  2017 Elsevier Inc.  

## 2017-01-08 DIAGNOSIS — I10 Essential (primary) hypertension: Secondary | ICD-10-CM | POA: Diagnosis not present

## 2017-01-08 DIAGNOSIS — M1990 Unspecified osteoarthritis, unspecified site: Secondary | ICD-10-CM | POA: Diagnosis not present

## 2017-01-08 DIAGNOSIS — N183 Chronic kidney disease, stage 3 (moderate): Secondary | ICD-10-CM | POA: Diagnosis not present

## 2017-01-08 DIAGNOSIS — I129 Hypertensive chronic kidney disease with stage 1 through stage 4 chronic kidney disease, or unspecified chronic kidney disease: Secondary | ICD-10-CM | POA: Diagnosis not present

## 2017-01-09 DIAGNOSIS — D649 Anemia, unspecified: Secondary | ICD-10-CM | POA: Diagnosis not present

## 2017-01-09 DIAGNOSIS — I10 Essential (primary) hypertension: Secondary | ICD-10-CM | POA: Diagnosis not present

## 2017-01-09 DIAGNOSIS — N183 Chronic kidney disease, stage 3 (moderate): Secondary | ICD-10-CM | POA: Diagnosis not present

## 2017-01-09 DIAGNOSIS — R809 Proteinuria, unspecified: Secondary | ICD-10-CM | POA: Diagnosis not present

## 2017-01-14 DIAGNOSIS — I10 Essential (primary) hypertension: Secondary | ICD-10-CM | POA: Diagnosis not present

## 2017-01-14 DIAGNOSIS — M25561 Pain in right knee: Secondary | ICD-10-CM | POA: Diagnosis not present

## 2017-01-14 DIAGNOSIS — K5900 Constipation, unspecified: Secondary | ICD-10-CM | POA: Diagnosis not present

## 2017-01-14 DIAGNOSIS — M159 Polyosteoarthritis, unspecified: Secondary | ICD-10-CM | POA: Diagnosis not present

## 2017-01-18 ENCOUNTER — Encounter: Payer: Self-pay | Admitting: Internal Medicine

## 2017-01-18 NOTE — Addendum Note (Signed)
Addended by: Lianne Cure A on: 01/18/2017 09:41 AM   Modules accepted: Orders

## 2017-01-21 DIAGNOSIS — I739 Peripheral vascular disease, unspecified: Secondary | ICD-10-CM | POA: Diagnosis not present

## 2017-01-31 DIAGNOSIS — H353132 Nonexudative age-related macular degeneration, bilateral, intermediate dry stage: Secondary | ICD-10-CM | POA: Diagnosis not present

## 2017-01-31 DIAGNOSIS — H401134 Primary open-angle glaucoma, bilateral, indeterminate stage: Secondary | ICD-10-CM | POA: Diagnosis not present

## 2017-02-25 ENCOUNTER — Ambulatory Visit (INDEPENDENT_AMBULATORY_CARE_PROVIDER_SITE_OTHER): Payer: Medicare Other | Admitting: Orthopedic Surgery

## 2017-02-25 DIAGNOSIS — G8929 Other chronic pain: Secondary | ICD-10-CM | POA: Diagnosis not present

## 2017-02-25 DIAGNOSIS — I779 Disorder of arteries and arterioles, unspecified: Secondary | ICD-10-CM

## 2017-02-25 DIAGNOSIS — M5442 Lumbago with sciatica, left side: Secondary | ICD-10-CM | POA: Diagnosis not present

## 2017-02-25 DIAGNOSIS — M25561 Pain in right knee: Secondary | ICD-10-CM | POA: Diagnosis not present

## 2017-02-25 DIAGNOSIS — M7631 Iliotibial band syndrome, right leg: Secondary | ICD-10-CM | POA: Diagnosis not present

## 2017-02-25 MED ORDER — METHYLPREDNISOLONE ACETATE 40 MG/ML IJ SUSP
40.0000 mg | Freq: Once | INTRAMUSCULAR | Status: AC
  Administered 2017-02-25: 40 mg via INTRAMUSCULAR

## 2017-02-25 NOTE — Addendum Note (Signed)
Addended by: Baldomero Lamy B on: 02/25/2017 03:35 PM   Modules accepted: Orders

## 2017-02-25 NOTE — Progress Notes (Signed)
Patient ID: Cindy Schultz, female   DOB: [birth year], 83 y.o.   MRN: [id number]  Right knee pain, pain lumbar spine  HPI Cindy Schultz is a 83 y.o. female.  Presents with a 40 year history of lower back pain which she relieves with Aspercreme and a heating pad no treatment needed  Pain lateral aspect right knee jabbing intermittent sharp at times doesn't appear to radiate from the back no joint effusion or stiffness  Review of Systems Review of Systems  Constitutional: Negative for fever and unexpected weight change.  Musculoskeletal: Positive for arthralgias, back pain, gait problem, joint swelling and myalgias.  Neurological: Negative for numbness.   (2 MINIMUM)  Past Medical History:  Diagnosis Date  . Cancer [facility]) March 2016   Skin Cancer :  Right Cheek     SCC  . Cancer [facility]) 2013   Skin Cancer  :  Anterior Neck   SCC  . Carotid artery occlusion   . Diverticulitis   . DVT (deep venous thrombosis) ([place]) 07/14/10   LEA doppler   . Glaucoma   . Hearing loss   . HTN (hypertension)    cholesterol  . Lumbar spine pain   . Lymphedema   . Macular degeneration   . Pain in joints   . Precancerous changes of the cervix    lesions  . Rectocele   . SOB (shortness of breath) 04/04/2010   2D echo EF=>55%    Past Surgical History:  Procedure Laterality Date  . ABDOMINAL HYSTERECTOMY    . appendix    . bladder tac    . COLONOSCOPY  09/20/2011   Procedure: COLONOSCOPY;  Surgeon: Beryle Beams, MD;  Location: [facility] ENDOSCOPY;  Service: Endoscopy;  Laterality: N/A;  . corneal erosion     cataracts   . elevated metatarsal arch    . fallen uterus    . hemmorrhoidectomy    . left total knee replacement  05/02/04   [name]  . met osteostomy    . morton's nueroma    . OVARIAN CYST SURGERY    . RECTOCELE REPAIR    . rupture rt groin    . salk    . SKIN CANCER EXCISION Right March 2016  2013   Cheek -  [facility]  and  Anterior Neck  . tracheotomy      Social History Social  History  Substance Use Topics  . Smoking status: Never Smoker  . Smokeless tobacco: Never Used  . Alcohol use No    Allergies  Allergen Reactions  . Promethazine Hcl Nausea And Vomiting and Other (See Comments)    REACTION: deathly sick  . Codeine Nausea And Vomiting  . Tramadol Hcl Nausea And Vomiting and Other (See Comments)    Motion Sickness  . Hydrocodone Other (See Comments)    Unknown Reaction     No outpatient prescriptions have been marked as taking for the 02/25/17 encounter (Appointment) with Carole Civil, MD.      Physical Exam Physical Exam 1.There were no vitals taken for this visit.  2. Gen. appearance. The patient is well-developed and well-nourished, grooming and hygiene are normal. There are no gross congenital abnormalities  3. The patient is alert and oriented to person place and time  4. Mood and affect are normal  5. Ambulation supported with cane    Examination reveals the following: 6. On inspection we find tenderness iliotibial  7. With the range of motion of  right knee normal  no restrictions  8. Stability tests were normal  in the sagittal and coronal  9. Strength tests revealed grade 5 motor strength  10. Skin we find no rash ulceration or erythema  11. Sensation remains intact  12 Vascular system shows no peripheral edema  Left knee no tenderness or swelling full range of motion  MEDICAL DECISION MAKING:    Data Reviewed No new data in terms of x-ray   Assessment Encounter Diagnoses  Name Primary?  . Left-sided low back pain with left-sided sciatica, unspecified chronicity Yes  . Chronic pain of right knee   . Iliotibial band syndrome of right side      Plan Injection iliotibial band  Verbal consent  Timeout  40 mg Depo-Medrol and 3 mL 1% lidocaine injected into the iliotibial band right knee  Continue current treatment for L-spine  Follow-up as needed  She would also like an IM shot in her gluteal area  to help with her back pain as she got last time. So the nurse put in an IM shot of 40 mg Depo-Medrol using the normal sterile technique and normal procedure of verbal consent and timeout.  Arther Abbott 02/25/2017, 3:25 PM

## 2017-03-08 ENCOUNTER — Encounter: Payer: Self-pay | Admitting: Gastroenterology

## 2017-03-08 ENCOUNTER — Ambulatory Visit (INDEPENDENT_AMBULATORY_CARE_PROVIDER_SITE_OTHER): Payer: Medicare Other | Admitting: Gastroenterology

## 2017-03-08 DIAGNOSIS — K5900 Constipation, unspecified: Secondary | ICD-10-CM | POA: Diagnosis not present

## 2017-03-08 DIAGNOSIS — I779 Disorder of arteries and arterioles, unspecified: Secondary | ICD-10-CM

## 2017-03-08 DIAGNOSIS — R1032 Left lower quadrant pain: Secondary | ICD-10-CM | POA: Diagnosis not present

## 2017-03-08 DIAGNOSIS — D122 Benign neoplasm of ascending colon: Secondary | ICD-10-CM | POA: Diagnosis not present

## 2017-03-08 DIAGNOSIS — K635 Polyp of colon: Secondary | ICD-10-CM | POA: Insufficient documentation

## 2017-03-08 NOTE — Assessment & Plan Note (Signed)
Well managed with Miralax for the most part. Patient is hesitant to change in bowel regimen for fear of incontinence. She is having BM mostly every day and utilizes MOM or enema as needed. Would continue current bowel regimen.   She has had some LLQ tenderness only with palpation. No fever. Not persistent. She is concerned about having colon cancer. She had a polyp that was not removed on her 2013 colonoscopy. She is requesting colonoscopy at this time.   Advised that I would discuss case with Dr. [name]. Patient requesting Dr. [name] specifically. Further recommendations to follow.

## 2017-03-08 NOTE — Progress Notes (Signed)
Primary Care Physician:  Sinda Du, MD  Primary Gastroenterologist:  Garfield Cornea, MD   Chief Complaint  Patient presents with  . Constipation  . Abdominal Pain    HPI:  Cindy Schultz is a 83 y.o. female here at the request of Dr. Luan Pulling for further evaluation of constipation and LLQ pain.   Patient has history of GI bleeding, suspected diverticular bleeding requiring hospitalization in 2013 and 2016. In 2013 bleeding was quite significant. She had several episodes of syncope related to the bleeding. Colonoscopy at that time and Dr. Carol Ada showed pancolonic diverticulosis but active bleeding site cannot be determined. Blood noted in the left colon. Small ascending colon polyp noted but was not removed in the setting of active bleeding. After that episode she significant change her diet, reducing carbonated beverages, nuts/seeds, spicy foods, fried foods, avoiding NSAIDs. She presented in 2016 with several episodes of fresh blood per rectum with clots, again felt to be diverticular in nature. CT and pelvis at that time showed minimal diverticulosis along the distal, transverse, descending, sigmoid colon.  Patient denies any further rectal bleeding. She has had some issues with constipation. She has some left lower quadrant tenderness when she presses in that area. She utilizes MiraLAX every morning. If she needs additional help to have a BM that day she takes milk of magnesia and sometimes an enema. She tries to have a BM every day. Recently started on an Amitiza but not able to take on a regular basis. Cost her $40. Doesn't like the medication. She does have some bloating. No abdominal pain except for when pressed on. No blood in the stool or melena. No nausea or vomiting. No heartburn or dysphagia. Appetite is good. No unintentional weight loss.   She is very worried about getting cancer. She wants a colonoscopy.     Current Outpatient Prescriptions  Medication Sig Dispense  Refill  . acetaminophen (TYLENOL) 500 MG tablet Take 500 mg by mouth every 6 (six) hours as needed (pain).     . Calcium 600-200 MG-UNIT per tablet Take 1 tablet by mouth daily.      . dorzolamide-timolol (COSOPT) 22.3-6.8 MG/ML ophthalmic solution Place 1 drop into both eyes 2 (two) times daily.      . fluticasone (FLONASE) 50 MCG/ACT nasal spray Place 2 sprays into the nose daily.      Marland Kitchen losartan (COZAAR) 50 MG tablet     . lubiprostone (AMITIZA) 24 MCG capsule Take 24 mcg by mouth 2 (two) times daily with a meal. Takes prn    . Multiple Vitamins-Minerals (OCUVITE PO) Take 1 tablet by mouth daily.    . Multiple Vitamins-Minerals (PRESERVISION AREDS 2 PO) Take by mouth. One tablet daily    . NON FORMULARY Vitamin D3  1000 IU   One tablet daily    . NON FORMULARY CholestOff originial    One tablet daily    . oxybutynin (DITROPAN-XL) 10 MG 24 hr tablet Take 10 mg by mouth daily.     . promethazine (PHENERGAN) 12.5 MG tablet Take 1 tablet (12.5 mg total) by mouth every 6 (six) hours as needed for nausea or vomiting. 60 tablet 2  . traMADol (ULTRAM) 50 MG tablet Take 1 tablet (50 mg total) by mouth every 6 (six) hours as needed. 60 tablet 5   No current facility-administered medications for this visit.     Allergies as of 03/08/2017 - Review Complete 03/08/2017  Allergen Reaction Noted  . Promethazine hcl Nausea And  Vomiting and Other (See Comments)   . Codeine Nausea And Vomiting   . Tramadol hcl Nausea And Vomiting and Other (See Comments)   . Hydrocodone Other (See Comments)     Past Medical History:  Diagnosis Date  . Cancer [facility]) March 2016   Skin Cancer :  Right Cheek     SCC  . Cancer [facility]) 2013   Skin Cancer  :  Anterior Neck   SCC  . Carotid artery occlusion   . Chronic kidney disease   . Diverticulitis   . DVT (deep venous thrombosis) ([place]) 07/14/10   LEA doppler   . GI bleeding    2013/2016: suspected diverticular bleeding  . Glaucoma   . Hearing loss   . HTN  (hypertension)    cholesterol  . Lumbar spine pain   . Lymphedema   . Macular degeneration   . Pain in joints   . Precancerous changes of the cervix    lesions  . Rectocele   . SOB (shortness of breath) 04/04/2010   2D echo EF=>55%    Past Surgical History:  Procedure Laterality Date  . ABDOMINAL HYSTERECTOMY    . appendix    . bladder tac    . COLONOSCOPY  09/20/2011   Dr. Carol Ada: pandiverticulosis, hemorrhoids. ascending colon polyp not removed in setting of active bleeding.  . corneal erosion     cataracts   . elevated metatarsal arch    . fallen uterus    . hemmorrhoidectomy    . left total knee replacement  05/02/04   [name]  . met osteostomy    . morton's nueroma    . OVARIAN CYST SURGERY    . RECTOCELE REPAIR    . rupture rt groin    . salk    . SKIN CANCER EXCISION Right March 2016  2013   Cheek -  [facility]  and  Anterior Neck  . tracheotomy      Family History  Problem Relation Age of Onset  . Stroke Mother   . Diabetes Mother   . Heart attack Father   . COPD Paternal Grandfather   . Dementia Sister   . Colon cancer Neg Hx     Social History   Social History  . Marital status: Divorced    Spouse name: N/A  . Number of children: N/A  . Years of education: N/A   Occupational History  . Not on file.   Social History Main Topics  . Smoking status: Never Smoker  . Smokeless tobacco: Never Used  . Alcohol use No  . Drug use: No  . Sexual activity: Not on file   Other Topics Concern  . Not on file   Social History Narrative  . No narrative on file      ROS:  General: Negative for anorexia, weight loss, fever, chills, fatigue, weakness. Eyes: Negative for vision changes.  ENT: Negative for hoarseness, difficulty swallowing , nasal congestion. CV: Negative for chest pain, angina, palpitations, dyspnea on exertion, peripheral edema.  Respiratory: Negative for dyspnea at rest, dyspnea on exertion, cough, sputum, wheezing.  GI: See  history of present illness. GU:  Negative for dysuria, hematuria, urinary incontinence, urinary frequency, nocturnal urination.  MS: Negative for joint pain, low back pain.  Derm: Negative for rash or itching.  Neuro: Negative for weakness, abnormal sensation, seizure, frequent headaches, memory loss, confusion.  Psych: Negative for anxiety, depression, suicidal ideation, hallucinations.  Endo: Negative for unusual weight change.  Heme:  Negative for bruising or bleeding. Allergy: Negative for rash or hives.    Physical Examination:  BP 111/62   Pulse (!) 59   Temp (!) 97.4 F (36.3 C) (Oral)   Ht _0  (1.575 m)   Wt 122 lb 9.6 oz (55.6 kg)   BMI 22.42 kg/m    General: Well-nourished, well-developed in no acute distress.  Head: Normocephalic, atraumatic.   Eyes: Conjunctiva pink, no icterus. Mouth: Oropharyngeal mucosa moist and pink , no lesions erythema or exudate. Neck: Supple without thyromegaly, masses, or lymphadenopathy.  Lungs: Clear to auscultation bilaterally.  Heart: Regular rate and rhythm, no murmurs rubs or gallops.  Abdomen: Bowel sounds are normal, nondistended, no hepatosplenomegaly or masses, no abdominal bruits or    hernia , no rebound or guarding.  Mild LLQ tenderness Rectal: not performed Extremities: No lower extremity edema. No clubbing or deformities.  Neuro: Alert and oriented x 4 , grossly normal neurologically.  Skin: Warm and dry, no rash or jaundice.   Psych: Alert and cooperative, normal mood and affect.

## 2017-03-08 NOTE — Patient Instructions (Signed)
1. I will discuss possible colonoscopy with Dr. Laurell Roof. [name] and let you know what is recommended.

## 2017-03-11 NOTE — Progress Notes (Signed)
CC'D TO PCP °

## 2017-03-18 ENCOUNTER — Telehealth: Payer: Self-pay | Admitting: Internal Medicine

## 2017-03-18 NOTE — Telephone Encounter (Signed)
[phone] PLEASE CALL PATIENT, HAS QUESTIONS ABOUT NEXT STEP OF CARE

## 2017-03-19 DIAGNOSIS — X32XXXD Exposure to sunlight, subsequent encounter: Secondary | ICD-10-CM | POA: Diagnosis not present

## 2017-03-19 DIAGNOSIS — L820 Inflamed seborrheic keratosis: Secondary | ICD-10-CM | POA: Diagnosis not present

## 2017-03-19 DIAGNOSIS — L570 Actinic keratosis: Secondary | ICD-10-CM | POA: Diagnosis not present

## 2017-03-19 NOTE — Telephone Encounter (Signed)
Cindy Schultz, were you going to schedule her for a procedure?

## 2017-03-19 NOTE — Telephone Encounter (Signed)
Please let patient know that I am waiting on Dr. [name] to make decision, he was out of town but back today. Hopefully a decision about colonoscopy by tomorrow.

## 2017-03-20 NOTE — Telephone Encounter (Signed)
Pt is aware. She said she is still having pain on her L side and it feels like it is swollen. She will wait for a return call.

## 2017-03-22 NOTE — Telephone Encounter (Signed)
Discussed with RMR. Left message for return call.   Schedule TCS. Dx: LLQ pain, ascending colon polyp

## 2017-03-25 ENCOUNTER — Telehealth: Payer: Self-pay | Admitting: Internal Medicine

## 2017-03-25 ENCOUNTER — Other Ambulatory Visit: Payer: Self-pay

## 2017-03-25 DIAGNOSIS — K635 Polyp of colon: Secondary | ICD-10-CM

## 2017-03-25 DIAGNOSIS — D122 Benign neoplasm of ascending colon: Secondary | ICD-10-CM

## 2017-03-25 DIAGNOSIS — R1032 Left lower quadrant pain: Secondary | ICD-10-CM

## 2017-03-25 MED ORDER — NA SULFATE-K SULFATE-MG SULF 17.5-3.13-1.6 GM/177ML PO SOLN: 1.0000 | ORAL | 0 refills | Status: DC

## 2017-03-25 NOTE — Telephone Encounter (Signed)
Called pt. Colonoscopy with RMR scheduled for 04/10/17 at 2:00pm. Rx for prep sent to pharmacy. Instructions mailed. Orders entered.

## 2017-03-25 NOTE — Telephone Encounter (Signed)
Pt said she was returning a call from a couple of days ago. LSL said she would speak with patient, but patient hung up before anyone could get back with her. Please return her call at [phone]

## 2017-03-25 NOTE — Telephone Encounter (Signed)
SEE OTHER PHONE NOTE 

## 2017-03-25 NOTE — Telephone Encounter (Signed)
Spoke with patient. Please schedule her for a colonoscopy as soon as possible. Diagnosis, left lower quadrant pain, ascending colon polyp.  Patient expecting return call today. Thanks!

## 2017-03-27 DIAGNOSIS — M159 Polyosteoarthritis, unspecified: Secondary | ICD-10-CM | POA: Diagnosis not present

## 2017-03-27 DIAGNOSIS — R1032 Left lower quadrant pain: Secondary | ICD-10-CM | POA: Diagnosis not present

## 2017-03-27 DIAGNOSIS — N183 Chronic kidney disease, stage 3 (moderate): Secondary | ICD-10-CM | POA: Diagnosis not present

## 2017-03-27 DIAGNOSIS — I10 Essential (primary) hypertension: Secondary | ICD-10-CM | POA: Diagnosis not present

## 2017-04-03 ENCOUNTER — Encounter: Payer: Self-pay | Admitting: Orthopedic Surgery

## 2017-04-03 ENCOUNTER — Ambulatory Visit (INDEPENDENT_AMBULATORY_CARE_PROVIDER_SITE_OTHER): Payer: Medicare Other | Admitting: Orthopedic Surgery

## 2017-04-03 VITALS — BP 129/69 | HR 76 | Ht 62.0 in | Wt 126.0 lb

## 2017-04-03 DIAGNOSIS — M25561 Pain in right knee: Secondary | ICD-10-CM

## 2017-04-03 DIAGNOSIS — G8929 Other chronic pain: Secondary | ICD-10-CM | POA: Diagnosis not present

## 2017-04-03 MED ORDER — METHYLPREDNISOLONE ACETATE 40 MG/ML IJ SUSP
40.0000 mg | Freq: Once | INTRAMUSCULAR | Status: AC
  Administered 2017-04-03: 40 mg via INTRA_ARTICULAR

## 2017-04-03 NOTE — Addendum Note (Signed)
Addended byCandice Camp on: 04/03/2017 02:25 PM   Modules accepted: Orders

## 2017-04-03 NOTE — Progress Notes (Signed)
Chief Complaint  Patient presents with  . Knee Pain    right    Requests inj knee   Procedure note right knee injection verbal consent was obtained to inject right knee joint  Timeout was completed to confirm the site of injection  The medications used were 40 mg of Depo-Medrol and 1% lidocaine 3 cc  Anesthesia was provided by ethyl chloride and the skin was prepped with alcohol.  After cleaning the skin with alcohol a 20-gauge needle was used to inject the right knee joint. There were no complications. A sterile bandage was applied.

## 2017-04-04 DIAGNOSIS — I739 Peripheral vascular disease, unspecified: Secondary | ICD-10-CM | POA: Diagnosis not present

## 2017-04-08 DIAGNOSIS — Z23 Encounter for immunization: Secondary | ICD-10-CM | POA: Diagnosis not present

## 2017-04-09 ENCOUNTER — Telehealth: Payer: Self-pay

## 2017-04-09 NOTE — Telephone Encounter (Signed)
Pt called office. She is unable to arrive earlier for TCS tomorrow d/t her daughter has already made arrangements with her job to have her at the hospital at 1:00pm. LMOVM for Endo scheduler.

## 2017-04-09 NOTE — Telephone Encounter (Signed)
Had cancellation on schedule for tomorrow. Tried to call pt to see if pt could arrive at the hospital one hour earlier tomorrow. LMOAM for her to call back.

## 2017-04-10 ENCOUNTER — Encounter (HOSPITAL_COMMUNITY): Payer: Self-pay | Admitting: *Deleted

## 2017-04-10 ENCOUNTER — Telehealth: Payer: Self-pay | Admitting: Internal Medicine

## 2017-04-10 ENCOUNTER — Ambulatory Visit (HOSPITAL_COMMUNITY)
Admission: RE | Admit: 2017-04-10 | Discharge: 2017-04-10 | Disposition: A | Discharge status: Home / Self Care | Payer: Medicare Other | Source: Ambulatory Visit | Attending: Internal Medicine | Admitting: Internal Medicine

## 2017-04-10 ENCOUNTER — Encounter (HOSPITAL_COMMUNITY)
Admission: RE | Disposition: A | Discharge status: Home / Self Care | Payer: Self-pay | Source: Ambulatory Visit | Attending: Internal Medicine

## 2017-04-10 DIAGNOSIS — Z888 Allergy status to other drugs, medicaments and biological substances status: Secondary | ICD-10-CM | POA: Insufficient documentation

## 2017-04-10 DIAGNOSIS — I129 Hypertensive chronic kidney disease with stage 1 through stage 4 chronic kidney disease, or unspecified chronic kidney disease: Secondary | ICD-10-CM | POA: Diagnosis not present

## 2017-04-10 DIAGNOSIS — K5730 Diverticulosis of large intestine without perforation or abscess without bleeding: Secondary | ICD-10-CM | POA: Diagnosis not present

## 2017-04-10 DIAGNOSIS — Z791 Long term (current) use of non-steroidal anti-inflammatories (NSAID): Secondary | ICD-10-CM | POA: Insufficient documentation

## 2017-04-10 DIAGNOSIS — K5900 Constipation, unspecified: Secondary | ICD-10-CM | POA: Diagnosis not present

## 2017-04-10 DIAGNOSIS — Z79899 Other long term (current) drug therapy: Secondary | ICD-10-CM | POA: Insufficient documentation

## 2017-04-10 DIAGNOSIS — N189 Chronic kidney disease, unspecified: Secondary | ICD-10-CM | POA: Insufficient documentation

## 2017-04-10 DIAGNOSIS — R1032 Left lower quadrant pain: Secondary | ICD-10-CM | POA: Diagnosis not present

## 2017-04-10 DIAGNOSIS — D120 Benign neoplasm of cecum: Secondary | ICD-10-CM | POA: Diagnosis not present

## 2017-04-10 DIAGNOSIS — K635 Polyp of colon: Secondary | ICD-10-CM

## 2017-04-10 DIAGNOSIS — D122 Benign neoplasm of ascending colon: Secondary | ICD-10-CM

## 2017-04-10 HISTORY — PX: COLONOSCOPY: SHX5424

## 2017-04-10 SURGERY — COLONOSCOPY
Anesthesia: Moderate Sedation

## 2017-04-10 MED ORDER — ONDANSETRON HCL 4 MG/2ML IJ SOLN
INTRAMUSCULAR | Status: DC | PRN
  Administered 2017-04-10: 4 mg via INTRAVENOUS

## 2017-04-10 MED ORDER — SODIUM CHLORIDE 0.9 % IV SOLN
INTRAVENOUS | Status: DC
  Administered 2017-04-10: 14:00:00 via INTRAVENOUS

## 2017-04-10 MED ORDER — MEPERIDINE HCL 100 MG/ML IJ SOLN
INTRAMUSCULAR | Status: DC | PRN
  Administered 2017-04-10: 25 mg via INTRAVENOUS

## 2017-04-10 MED ORDER — MIDAZOLAM HCL 5 MG/5ML IJ SOLN
INTRAMUSCULAR | Status: DC | PRN
  Administered 2017-04-10: 2 mg via INTRAVENOUS
  Administered 2017-04-10: 1 mg via INTRAVENOUS

## 2017-04-10 MED ORDER — MEPERIDINE HCL 100 MG/ML IJ SOLN
INTRAMUSCULAR | Status: AC
  Filled 2017-04-10: qty 2

## 2017-04-10 MED ORDER — MIDAZOLAM HCL 5 MG/5ML IJ SOLN
INTRAMUSCULAR | Status: AC
  Filled 2017-04-10: qty 10

## 2017-04-10 MED ORDER — ONDANSETRON HCL 4 MG/2ML IJ SOLN
INTRAMUSCULAR | Status: AC
  Filled 2017-04-10: qty 2

## 2017-04-10 NOTE — Telephone Encounter (Signed)
Spoke to pt. Her stool is clear yellow. She will wear a depend and sit on a towel to go to the hospital. She has been drying her clothes after having accidents and has received several phone calls. Advised her to try and sit on toilet for little while to try to finish.

## 2017-04-10 NOTE — Telephone Encounter (Signed)
Pt is scheduled a colonoscopy this afternoon and is worried because she is still passing stool and can't stop. She is worried about getting to the hospital and being a mess. Please call her at [phone]

## 2017-04-10 NOTE — Telephone Encounter (Signed)
Tried to call pt, line busy

## 2017-04-10 NOTE — Op Note (Signed)
[facility] Patient Name: Cindy Schultz Procedure Date: 04/10/2017 2:45 PM MRN: [id number] Date of Birth: 1923-07-13 Attending MD: Norvel Richards , MD CSN: [id number] Age: 83 Admit Type: Outpatient Procedure:                Colonoscopy Indications:              Abdominal pain in the left lower quadrant Providers:                Norvel Richards, MD, Selena Lesser, Hinton Rao, RN, Randa Spike, Technician Referring MD:              Medicines:                Midazolam 3 mg IV, Meperidine 25 mg IV Complications:            No immediate complications. Estimated Blood Loss:     Estimated blood loss was minimal. Procedure:                Pre-Anesthesia Assessment:                           - Prior to the procedure, a History and Physical                            was performed, and patient medications and                            allergies were reviewed. The patient's tolerance of                            previous anesthesia was also reviewed. The risks                            and benefits of the procedure and the sedation                            options and risks were discussed with the patient.                            All questions were answered, and informed consent                            was obtained. Prior Anticoagulants: The patient has                            taken no previous anticoagulant or antiplatelet                            agents. ASA Grade Assessment: III - A patient with                            severe systemic disease. After reviewing the risks  and benefits, the patient was deemed in                            satisfactory condition to undergo the procedure.                           After obtaining informed consent, the colonoscope                            was passed under direct vision. Throughout the                            procedure, the patient's blood pressure,  pulse, and                            oxygen saturations were monitored continuously. The                            EC-[id number] ([id number]) scope was introduced through                            the anus and advanced to the the cecum, identified                            by appendiceal orifice and ileocecal valve. The                            ileocecal valve, appendiceal orifice, and rectum                            were photographed. The quality of the bowel                            preparation was adequate. Scope In: 3:09:10 PM Scope Out: 3:22:49 PM Scope Withdrawal Time: 0 hours 8 minutes 40 seconds  Total Procedure Duration: 0 hours 13 minutes 39 seconds  Findings:      The perianal and digital rectal examinations were normal.      Many small and large-mouthed diverticula were found in the entire colon.      Three semi-pedunculated polyps were found in the ascending colon and       cecum. The polyps were 4 to 6 mm in size. These polyps were removed with       a cold snare. Resection and retrieval were complete. Estimated blood       loss was minimal.      The exam was otherwise without abnormality on direct and retroflexion       views. Impression:               - Diverticulosis in the entire examined colon.                           - Three 4 to 6 mm polyps in the ascending colon and  in the cecum, removed with a cold snare. Resected                            and retrieved.                           - The examination was otherwise normal on direct                            and retroflexion views. Moderate Sedation:      Moderate (conscious) sedation was administered by the endoscopy nurse       and supervised by the endoscopist. The following parameters were       monitored: oxygen saturation, heart rate, blood pressure, respiratory       rate, EKG, adequacy of pulmonary ventilation, and response to care.       Total physician intraservice time was  17 minutes. Recommendation:           - Patient has a contact number available for                            emergencies. The signs and symptoms of potential                            delayed complications were discussed with the                            patient. Return to normal activities tomorrow.                            Written discharge instructions were provided to the                            patient.                           - Resume previous diet. Continue MiraLAX daily as                            needed. Begin Benefiber 1 tablespoon daily for the                            next 30 days; then increase to1 tablespoon twice                            daily thereafter.                           - Continue present medications.                           - No repeat colonoscopy due to age.                           - Return to GI clinic in 6 weeks. Procedure Code(s):        --- Professional ---  [phone], Colonoscopy, flexible; with removal of                            tumor(s), polyp(s), or other lesion(s) by snare                            technique                           99152, Moderate sedation services provided by the                            same physician or other qualified health care                            professional performing the diagnostic or                            therapeutic service that the sedation supports,                            requiring the presence of an independent trained                            observer to assist in the monitoring of the                            patient's level of consciousness and physiological                            status; initial 15 minutes of intraservice time,                            patient age 77 years or older Diagnosis Code(s):        --- Professional ---                           D12.2, Benign neoplasm of ascending colon                           D12.0, Benign neoplasm  of cecum                           R10.32, Left lower quadrant pain                           K57.30, Diverticulosis of large intestine without                            perforation or abscess without bleeding CPT copyright 2016 American Medical Association. All rights reserved. The codes documented in this report are preliminary and upon coder review may  be revised to meet current compliance requirements. Cristopher Estimable. Jariah Jarmon, MD Norvel Richards, MD 04/10/2017 3:30:45 PM This report has been signed electronically. Number of Addenda: 0

## 2017-04-10 NOTE — H&P (Signed)
_0 @   Primary Care Physician:  Sinda Du, MD Primary Gastroenterologist:  Dr. [name]  Pre-Procedure History & Physical: HPI:  Cindy Schultz is a 83 y.o. female here for diagnostic colonoscopy. Left lower quadrant abdominal pain. History of a colonic polyp not removed. She persisted about having one more colonoscopy.  Past Medical History:  Diagnosis Date  . Cancer [facility]) March 2016   Skin Cancer :  Right Cheek     SCC  . Cancer [facility]) 2013   Skin Cancer  :  Anterior Neck   SCC  . Carotid artery occlusion   . Chronic kidney disease   . Diverticulitis   . DVT (deep venous thrombosis) ([place]) 07/14/10   LEA doppler   . GI bleeding    2013/2016: suspected diverticular bleeding  . Glaucoma   . Hearing loss   . HTN (hypertension)    cholesterol  . Lumbar spine pain   . Lymphedema   . Macular degeneration   . Pain in joints   . Precancerous changes of the cervix    lesions  . Rectocele   . SOB (shortness of breath) 04/04/2010   2D echo EF=>55%    Past Surgical History:  Procedure Laterality Date  . ABDOMINAL HYSTERECTOMY    . appendix    . bladder tac    . COLONOSCOPY  09/20/2011   Dr. Carol Ada: pandiverticulosis, hemorrhoids. ascending colon polyp not removed in setting of active bleeding.  . corneal erosion     cataracts   . elevated metatarsal arch    . fallen uterus    . hemmorrhoidectomy    . left total knee replacement  05/02/04   [name]  . met osteostomy    . morton's nueroma    . OVARIAN CYST SURGERY    . RECTOCELE REPAIR    . rupture rt groin    . salk    . SKIN CANCER EXCISION Right March 2016  2013   Cheek -  [facility]  and  Anterior Neck  . tracheotomy      Prior to Admission medications   Medication Sig Start Date End Date Taking? Authorizing Provider  acetaminophen (TYLENOL) 500 MG tablet Take 500 mg by mouth every 6 (six) hours as needed (pain).    Yes [provider]  Cholecalciferol (VITAMIN D3) 10000 units TABS Take 1,000 Units  by mouth daily.   Yes [provider]  dorzolamide-timolol (COSOPT) 22.3-6.8 MG/ML ophthalmic solution Place 1 drop into both eyes 2 (two) times daily.     Yes [provider]  fluticasone (FLONASE) 50 MCG/ACT nasal spray Place 2 sprays into the nose daily.     Yes [provider]  losartan (COZAAR) 50 MG tablet  12/25/14  Yes [provider]  LUMIGAN 0.01 % SOLN  03/06/17  Yes [provider]  Na Sulfate-K Sulfate-Mg Sulf (SUPREP BOWEL PREP KIT) 17.5-3.13-1.6 GM/180ML SOLN Take 1 kit by mouth as directed. 03/25/17  Yes Eura Radabaugh, Cristopher Estimable, MD  oxybutynin (DITROPAN-XL) 10 MG 24 hr tablet Take 10 mg by mouth daily.    Yes [provider]  Plant Sterols and Stanols (CHOLEST OFF PO) Take 1 tablet by mouth daily.   Yes [provider]  promethazine (PHENERGAN) 12.5 MG tablet Take 1 tablet (12.5 mg total) by mouth every 6 (six) hours as needed for nausea or vomiting. 11/19/16  Yes Carole Civil, MD  traMADol (ULTRAM) 50 MG tablet Take 1 tablet (50 mg total) by mouth every  6 (six) hours as needed. 05/22/16  Yes Carole Civil, MD  Calcium 600-200 MG-UNIT per tablet Take 1 tablet by mouth daily.      [provider]  lubiprostone (AMITIZA) 24 MCG capsule Take 24 mcg by mouth 2 (two) times daily with a meal. Takes prn    [provider]  Multiple Vitamins-Minerals (OCUVITE PO) Take 1 tablet by mouth daily.    [provider]  Multiple Vitamins-Minerals (PRESERVISION AREDS 2 PO) Take by mouth. One tablet daily    [provider]  NIFEdipine (PROCARDIA-XL/ADALAT-CC/NIFEDICAL-XL) 30 MG 24 hr tablet Take 30 mg by mouth daily. 03/06/17   [provider]  polyethylene glycol (MIRALAX / GLYCOLAX) packet Take 17 g by mouth daily.    [provider]    Allergies as of 03/25/2017 - Review Complete 03/08/2017  Allergen Reaction Noted  . Promethazine hcl Nausea And Vomiting and Other (See Comments)    . Codeine Nausea And Vomiting   . Tramadol hcl Nausea And Vomiting and Other (See Comments)   . Hydrocodone Other (See Comments)     Family History  Problem Relation Age of Onset  . Stroke Mother   . Diabetes Mother   . Heart attack Father   . COPD Paternal Grandfather   . Dementia Sister   . Colon cancer Neg Hx     Social History   Social History  . Marital status: Divorced    Spouse name: N/A  . Number of children: N/A  . Years of education: N/A   Occupational History  . Not on file.   Social History Main Topics  . Smoking status: Never Smoker  . Smokeless tobacco: Never Used  . Alcohol use No  . Drug use: No  . Sexual activity: Not on file   Other Topics Concern  . Not on file   Social History Narrative  . No narrative on file    Review of Systems: See HPI, otherwise negative ROS  Physical Exam: BP (!) 146/64   Pulse 67   Temp 97.8 F (36.6 C) (Oral)   Resp 20   Ht _0  (1.575 m)   Wt 126 lb (57.2 kg)   SpO2 100%   BMI 23.05 kg/m  General:   Alert,  Well-developed, well-nourished, pleasant and cooperative in NAD Neck:  Supple; no masses or thyromegaly. No significant cervical adenopathy. Lungs:  Clear throughout to auscultation.   No wheezes, crackles, or rhonchi. No acute distress. Heart:  Regular rate and rhythm; no murmurs, clicks, rubs,  or gallops. Abdomen: Non-distended, normal bowel sounds.  Soft and nontender without appreciable mass or hepatosplenomegaly.  Pulses:  Normal pulses noted. Extremities:  Without clubbing or edema.  Impression:   83 year old lady with chronic constipation diverticulosis history of a colonic polyp in ascending segment not removed. Patient continues to complain of abdominal symptoms in the setting of constipation. She is desirous (and persistent) about having one more colonoscopy). Her quality of life remains pretty good. We have lengthy discussion about this in the office and again today at  bedside.  Recommendations:  I have offered patient a colonoscopy today. I have offered the patient a colonoscopy today.  The risks, benefits, limitations, alternatives and imponderables have been reviewed with the patient. Questions have been answered. All parties are agreeable.    Notice: This dictation was prepared with Dragon dictation along with smaller phrase technology. Any transcriptional errors that result from this process are unintentional and may not be corrected upon  review.  

## 2017-04-10 NOTE — Discharge Instructions (Addendum)
°  Colonoscopy Discharge Instructions  Read the instructions outlined below and refer to this sheet in the next few weeks. These discharge instructions provide you with general information on caring for yourself after you leave the hospital. Your doctor may also give you specific instructions. While your treatment has been planned according to the most current medical practices available, unavoidable complications occasionally occur. If you have any problems or questions after discharge, call Dr. [name] at [phone]. ACTIVITY  You may resume your regular activity, but move at a slower pace for the next 24 hours.   Take frequent rest periods for the next 24 hours.   Walking will help get rid of the air and reduce the bloated feeling in your belly (abdomen).   No driving for 24 hours (because of the medicine (anesthesia) used during the test).    Do not sign any important legal documents or operate any machinery for 24 hours (because of the anesthesia used during the test).  NUTRITION  Drink plenty of fluids.   You may resume your normal diet as instructed by your doctor.   Begin with a light meal and progress to your normal diet. Heavy or fried foods are harder to digest and may make you feel sick to your stomach (nauseated).   Avoid alcoholic beverages for 24 hours or as instructed.  MEDICATIONS  You may resume your normal medications unless your doctor tells you otherwise.  WHAT YOU CAN EXPECT TODAY  Some feelings of bloating in the abdomen.   Passage of more gas than usual.   Spotting of blood in your stool or on the toilet paper.  IF YOU HAD POLYPS REMOVED DURING THE COLONOSCOPY:  No aspirin products for 7 days or as instructed.   No alcohol for 7 days or as instructed.   Eat a soft diet for the next 24 hours.  FINDING OUT THE RESULTS OF YOUR TEST Not all test results are available during your visit. If your test results are not back during the visit, make an appointment  with your caregiver to find out the results. Do not assume everything is normal if you have not heard from your caregiver or the medical facility. It is important for you to follow up on all of your test results.  SEEK IMMEDIATE MEDICAL ATTENTION IF:  You have more than a spotting of blood in your stool.   Your belly is swollen (abdominal distention).   You are nauseated or vomiting.   You have a temperature over 101.   You have abdominal pain or discomfort that is severe or gets worse throughout the day.    Colon polyp and diverticulosis information provided  Begin Benefiber 1 tablespoon daily for 30 days;  then increase to 1 tablespoon twice daily thereafter  Continue MiraLAX daily as needed for constipation  Further recommendations to follow pending review of pathology report

## 2017-04-13 ENCOUNTER — Encounter: Payer: Self-pay | Admitting: Internal Medicine

## 2017-04-15 ENCOUNTER — Encounter: Payer: Self-pay | Admitting: Internal Medicine

## 2017-04-15 ENCOUNTER — Encounter (HOSPITAL_COMMUNITY): Payer: Self-pay | Admitting: Internal Medicine

## 2017-05-03 ENCOUNTER — Ambulatory Visit (INDEPENDENT_AMBULATORY_CARE_PROVIDER_SITE_OTHER): Payer: Medicare Other | Admitting: Orthopedic Surgery

## 2017-05-03 VITALS — BP 110/67 | HR 75 | Ht 62.0 in | Wt 126.0 lb

## 2017-05-03 DIAGNOSIS — M25561 Pain in right knee: Secondary | ICD-10-CM | POA: Diagnosis not present

## 2017-05-03 DIAGNOSIS — I779 Disorder of arteries and arterioles, unspecified: Secondary | ICD-10-CM

## 2017-05-03 DIAGNOSIS — Z96652 Presence of left artificial knee joint: Secondary | ICD-10-CM

## 2017-05-03 DIAGNOSIS — M25562 Pain in left knee: Secondary | ICD-10-CM | POA: Diagnosis not present

## 2017-05-03 MED ORDER — MELOXICAM 7.5 MG PO TABS: 7.5000 mg | ORAL_TABLET | Freq: Every day | ORAL | 5 refills | Status: DC

## 2017-05-03 NOTE — Progress Notes (Signed)
Progress Note   Patient ID: Cindy Schultz, female   DOB: 1924/03/31, 83 y.o.   MRN: [id number]  Chief Complaint  Patient presents with  . Follow-up    Recheck on right knee.    83 year old female comes in recheck right knee complains of bilateral knee pain status post left total knee many years ago.  She has multiple joint aches and pains her soft tissues hurt     Review of Systems  Constitutional: Negative for chills, fever and malaise/fatigue.  Musculoskeletal: Positive for joint pain and myalgias.   No outpatient medications have been marked as taking for the 05/03/17 encounter (Office Visit) with Carole Civil, MD.    Allergies  Allergen Reactions  . Promethazine Hcl Nausea And Vomiting and Other (See Comments)    REACTION:Confusion  . Codeine Nausea And Vomiting  . Tramadol Hcl Nausea And Vomiting and Other (See Comments)    Motion Sickness  . Hydrocodone Other (See Comments)    Unknown Reaction      BP 110/67   Pulse 75   Ht 5\' 2"  (1.575 m)   Wt 126 lb (57.2 kg)   BMI 23.05 kg/m   Physical Exam She is oriented to person place and time mood and affect are flat  She has a well-healed incision on the left knee flexion arc is 115 degrees knee is stable on the right knee she is tender diffusely including the thigh shin bone outpatient of her other joints show multiple joint inflammation motor exam normal skin intact no rash neurovascular exam normal both legs  Medical decision-making Encounter Diagnoses  Name Primary?  Marland Kitchen Arthralgia of both lower legs Yes  . History of left knee replacement     Meds ordered this encounter  Medications  . meloxicam (MOBIC) 7.5 MG tablet    Sig: Take 1 tablet (7.5 mg total) daily by mouth.    Dispense:  30 tablet    Refill:  5   She has multiple joint inflammation and soft tissue inflammation recommend Mobic 7.5 mg once a day  No follow-up  Arther Abbott, MD 05/03/2017 10:47 AM

## 2017-05-17 DIAGNOSIS — H401134 Primary open-angle glaucoma, bilateral, indeterminate stage: Secondary | ICD-10-CM | POA: Diagnosis not present

## 2017-05-17 DIAGNOSIS — H353132 Nonexudative age-related macular degeneration, bilateral, intermediate dry stage: Secondary | ICD-10-CM | POA: Diagnosis not present

## 2017-05-19 ENCOUNTER — Encounter (HOSPITAL_COMMUNITY): Payer: Self-pay | Admitting: Emergency Medicine

## 2017-05-19 ENCOUNTER — Other Ambulatory Visit: Payer: Self-pay

## 2017-05-19 ENCOUNTER — Inpatient Hospital Stay (HOSPITAL_COMMUNITY)
Admission: EM | Admit: 2017-05-19 | Discharge: 2017-05-24 | DRG: 378 | Disposition: A | Discharge status: Home / Self Care | Payer: Medicare Other | Attending: Internal Medicine | Admitting: Internal Medicine

## 2017-05-19 DIAGNOSIS — Z833 Family history of diabetes mellitus: Secondary | ICD-10-CM

## 2017-05-19 DIAGNOSIS — Z86010 Personal history of colonic polyps: Secondary | ICD-10-CM

## 2017-05-19 DIAGNOSIS — Z8249 Family history of ischemic heart disease and other diseases of the circulatory system: Secondary | ICD-10-CM

## 2017-05-19 DIAGNOSIS — Z85828 Personal history of other malignant neoplasm of skin: Secondary | ICD-10-CM

## 2017-05-19 DIAGNOSIS — K922 Gastrointestinal hemorrhage, unspecified: Secondary | ICD-10-CM | POA: Diagnosis present

## 2017-05-19 DIAGNOSIS — I129 Hypertensive chronic kidney disease with stage 1 through stage 4 chronic kidney disease, or unspecified chronic kidney disease: Secondary | ICD-10-CM | POA: Diagnosis not present

## 2017-05-19 DIAGNOSIS — Z791 Long term (current) use of non-steroidal anti-inflammatories (NSAID): Secondary | ICD-10-CM

## 2017-05-19 DIAGNOSIS — H3530 Unspecified macular degeneration: Secondary | ICD-10-CM | POA: Diagnosis present

## 2017-05-19 DIAGNOSIS — I10 Essential (primary) hypertension: Secondary | ICD-10-CM | POA: Diagnosis not present

## 2017-05-19 DIAGNOSIS — D62 Acute posthemorrhagic anemia: Secondary | ICD-10-CM | POA: Diagnosis not present

## 2017-05-19 DIAGNOSIS — Z96652 Presence of left artificial knee joint: Secondary | ICD-10-CM | POA: Diagnosis present

## 2017-05-19 DIAGNOSIS — N184 Chronic kidney disease, stage 4 (severe): Secondary | ICD-10-CM | POA: Diagnosis not present

## 2017-05-19 DIAGNOSIS — K559 Vascular disorder of intestine, unspecified: Secondary | ICD-10-CM | POA: Diagnosis not present

## 2017-05-19 DIAGNOSIS — Z79899 Other long term (current) drug therapy: Secondary | ICD-10-CM

## 2017-05-19 DIAGNOSIS — K5731 Diverticulosis of large intestine without perforation or abscess with bleeding: Secondary | ICD-10-CM

## 2017-05-19 DIAGNOSIS — N183 Chronic kidney disease, stage 3 unspecified: Secondary | ICD-10-CM

## 2017-05-19 DIAGNOSIS — Z885 Allergy status to narcotic agent status: Secondary | ICD-10-CM

## 2017-05-19 DIAGNOSIS — Z888 Allergy status to other drugs, medicaments and biological substances status: Secondary | ICD-10-CM

## 2017-05-19 DIAGNOSIS — K625 Hemorrhage of anus and rectum: Secondary | ICD-10-CM | POA: Diagnosis present

## 2017-05-19 DIAGNOSIS — H9190 Unspecified hearing loss, unspecified ear: Secondary | ICD-10-CM | POA: Diagnosis present

## 2017-05-19 DIAGNOSIS — Z90710 Acquired absence of both cervix and uterus: Secondary | ICD-10-CM

## 2017-05-19 DIAGNOSIS — K5733 Diverticulitis of large intestine without perforation or abscess with bleeding: Secondary | ICD-10-CM | POA: Diagnosis not present

## 2017-05-19 DIAGNOSIS — D7589 Other specified diseases of blood and blood-forming organs: Secondary | ICD-10-CM

## 2017-05-19 DIAGNOSIS — H409 Unspecified glaucoma: Secondary | ICD-10-CM | POA: Diagnosis present

## 2017-05-19 LAB — POC OCCULT BLOOD, ED: FECAL OCCULT BLD: POSITIVE — AB

## 2017-05-19 LAB — BASIC METABOLIC PANEL
Anion gap: 9 (ref 5–15)
BUN: 38 mg/dL — ABNORMAL HIGH (ref 6–20)
CALCIUM: 9.6 mg/dL (ref 8.9–10.3)
CO2: 25 mmol/L (ref 22–32)
Chloride: 108 mmol/L (ref 101–111)
Creatinine, Ser: 1.64 mg/dL — ABNORMAL HIGH (ref 0.44–1.00)
GFR, EST AFRICAN AMERICAN: 30 mL/min — AB (ref >60)
GFR, EST NON AFRICAN AMERICAN: 26 mL/min — AB (ref >60)
Glucose, Bld: 111 mg/dL — ABNORMAL HIGH (ref 65–99)
Potassium: 3.5 mmol/L (ref 3.5–5.1)
SODIUM: 142 mmol/L (ref 135–145)

## 2017-05-19 LAB — CBC
HCT: 32.9 % — ABNORMAL LOW (ref 36.0–46.0)
HEMATOCRIT: 31.7 % — AB (ref 36.0–46.0)
HEMOGLOBIN: 10.4 g/dL — AB (ref 12.0–15.0)
HEMOGLOBIN: 10.1 g/dL — AB (ref 12.0–15.0)
MCH: 33.4 pg (ref 26.0–34.0)
MCH: 33.2 pg (ref 26.0–34.0)
MCHC: 31.6 g/dL (ref 30.0–36.0)
MCHC: 31.9 g/dL (ref 30.0–36.0)
MCV: 105.8 fL — ABNORMAL HIGH (ref 78.0–100.0)
MCV: 104.3 fL — ABNORMAL HIGH (ref 78.0–100.0)
Platelets: 212 10*3/uL (ref 150–400)
Platelets: 216 10*3/uL (ref 150–400)
RBC: 3.11 MIL/uL — AB (ref 3.87–5.11)
RBC: 3.04 MIL/uL — ABNORMAL LOW (ref 3.87–5.11)
RDW: 13.9 % (ref 11.5–15.5)
RDW: 14.2 % (ref 11.5–15.5)
WBC: 6.1 10*3/uL (ref 4.0–10.5)
WBC: 7.3 10*3/uL (ref 4.0–10.5)

## 2017-05-19 LAB — PREPARE RBC (CROSSMATCH)

## 2017-05-19 MED ORDER — ACETAMINOPHEN 325 MG PO TABS
650.0000 mg | ORAL_TABLET | Freq: Four times a day (QID) | ORAL | Status: DC | PRN
  Administered 2017-05-19: 650 mg via ORAL
  Filled 2017-05-19: qty 2

## 2017-05-19 MED ORDER — NIFEDIPINE ER OSMOTIC RELEASE 30 MG PO TB24
30.0000 mg | ORAL_TABLET | Freq: Every day | ORAL | Status: DC
  Administered 2017-05-20 – 2017-05-24 (×5): 30 mg via ORAL
  Filled 2017-05-19 (×5): qty 1

## 2017-05-19 MED ORDER — OXYBUTYNIN CHLORIDE ER 5 MG PO TB24
10.0000 mg | ORAL_TABLET | Freq: Every day | ORAL | Status: DC
  Administered 2017-05-20 – 2017-05-24 (×5): 10 mg via ORAL
  Filled 2017-05-19 (×5): qty 2

## 2017-05-19 MED ORDER — SODIUM CHLORIDE 0.9 % IV SOLN
INTRAVENOUS | Status: DC
  Administered 2017-05-19 – 2017-05-22 (×7): via INTRAVENOUS

## 2017-05-19 MED ORDER — LOSARTAN POTASSIUM 50 MG PO TABS
50.0000 mg | ORAL_TABLET | Freq: Every day | ORAL | Status: DC
  Administered 2017-05-19 – 2017-05-24 (×6): 50 mg via ORAL
  Filled 2017-05-19 (×6): qty 1

## 2017-05-19 MED ORDER — LATANOPROST 0.005 % OP SOLN
1.0000 [drp] | Freq: Every day | OPHTHALMIC | Status: DC
  Administered 2017-05-22: 1 [drp] via OPHTHALMIC
  Filled 2017-05-19: qty 2.5

## 2017-05-19 MED ORDER — ONDANSETRON HCL 4 MG/2ML IJ SOLN: 4.0000 mg | Freq: Four times a day (QID) | INTRAMUSCULAR | Status: DC | PRN

## 2017-05-19 MED ORDER — SODIUM CHLORIDE 0.9 % IV SOLN: Freq: Once | INTRAVENOUS | Status: DC

## 2017-05-19 MED ORDER — ACETAMINOPHEN 650 MG RE SUPP: 650.0000 mg | Freq: Four times a day (QID) | RECTAL | Status: DC | PRN

## 2017-05-19 MED ORDER — LUBIPROSTONE 24 MCG PO CAPS
24.0000 ug | ORAL_CAPSULE | Freq: Two times a day (BID) | ORAL | Status: DC
  Administered 2017-05-19 – 2017-05-24 (×4): 24 ug via ORAL
  Filled 2017-05-19 (×5): qty 1

## 2017-05-19 MED ORDER — SODIUM CHLORIDE 0.9 % IV BOLUS (SEPSIS)
500.0000 mL | Freq: Once | INTRAVENOUS | Status: AC
  Administered 2017-05-19: 500 mL via INTRAVENOUS

## 2017-05-19 MED ORDER — ONDANSETRON HCL 4 MG PO TABS: 4.0000 mg | ORAL_TABLET | Freq: Four times a day (QID) | ORAL | Status: DC | PRN

## 2017-05-19 MED ORDER — SENNOSIDES-DOCUSATE SODIUM 8.6-50 MG PO TABS
1.0000 | ORAL_TABLET | Freq: Every evening | ORAL | Status: DC | PRN
  Administered 2017-05-20: 1 via ORAL
  Filled 2017-05-19: qty 1

## 2017-05-19 MED ORDER — DORZOLAMIDE HCL-TIMOLOL MAL 2-0.5 % OP SOLN
1.0000 [drp] | Freq: Two times a day (BID) | OPHTHALMIC | Status: DC
  Administered 2017-05-19 – 2017-05-24 (×10): 1 [drp] via OPHTHALMIC
  Filled 2017-05-19 (×2): qty 10

## 2017-05-19 NOTE — H&P (Signed)
History and Physical    Cindy Schultz MRN:[id number] DOB: 04/06/1924 DOA: 05/19/2017  Referring MD/NP/PA: Kevin Steinl, EDP PCP: Hawkins, Edward, MD  Patient coming from: Home  Chief Complaint: Rectal bleeding  HPI: Cindy Schultz is a 83 y.o. female who lives independently who has a past medical history of hypertension, arthritis, as well as prior history of GI bleeding who just recently had a colonoscopy 2 months ago with reports of pandiverticulosis and a couple polyps that were removed.  She comes in today after having 4 episodes of rectal bleeding that started this morning at around 7, last episode at around 9:30 AM.  She states that initially it was stool mixed with blood and subsequently just red blood.  She never felt dizzy or lightheaded or short of breath.  She called her daughter who advised her to come to the hospital for further evaluation.  Her vital signs are stable, she is not orthostatic, her hemoglobin is 10.4, creatinine is 1.64 which is her baseline.  Admission is requested.  Past Medical/Surgical History: Past Medical History:  Diagnosis Date  . Cancer (HCC) March 2016   Skin Cancer :  Right Cheek     SCC  . Cancer (HCC) 2013   Skin Cancer  :  Anterior Neck   SCC  . Carotid artery occlusion   . Chronic kidney disease   . Diverticulitis   . DVT (deep venous thrombosis) (HCC) 07/14/10   LEA doppler   . GI bleeding    2013/2016: suspected diverticular bleeding  . Glaucoma   . Hearing loss   . HTN (hypertension)    cholesterol  . Lumbar spine pain   . Lymphedema   . Macular degeneration   . Pain in joints   . Precancerous changes of the cervix    lesions  . Rectocele   . SOB (shortness of breath) 04/04/2010   2D echo EF=>55%    Past Surgical History:  Procedure Laterality Date  . ABDOMINAL HYSTERECTOMY    . appendix    . bladder tac    . COLONOSCOPY  09/20/2011   Dr. Patrick Hung: pandiverticulosis, hemorrhoids. ascending colon polyp not removed in  setting of active bleeding.  . COLONOSCOPY N/A 04/10/2017   Procedure: COLONOSCOPY;  Surgeon: Rourk, Robert M, MD;  Location: [facility];  Service: Endoscopy;  Laterality: N/A;  2:00pm - can NOT come earlier  . corneal erosion     cataracts   . elevated metatarsal arch    . fallen uterus    . hemmorrhoidectomy    . left total knee replacement  05/02/04   [facility]  . met osteostomy    . morton's nueroma    . OVARIAN CYST SURGERY    . RECTOCELE REPAIR    . rupture rt groin    . salk    . SKIN CANCER EXCISION Right March 2016  2013   Cheek -  SCC  and  Anterior Neck  . tracheotomy      Social History:  reports that  has never smoked. she has never used smokeless tobacco. She reports that she does not drink alcohol or use drugs.  Allergies: Allergies  Allergen Reactions  . Promethazine Hcl Nausea And Vomiting and Other (See Comments)    REACTION:Confusion  . Codeine Nausea And Vomiting  . Tramadol Hcl Nausea And Vomiting and Other (See Comments)    Motion Sickness  . Hydrocodone Other (See Comments)    Unknown Reaction       Family History:  Family History  Problem Relation Age of Onset  . Stroke Mother   . Diabetes Mother   . Heart attack Father   . COPD Paternal Grandfather   . Dementia Sister   . Colon cancer Neg Hx     Prior to Admission medications   Medication Sig Start Date End Date Taking? Authorizing Provider  acetaminophen (TYLENOL) 500 MG tablet Take 500 mg by mouth every 6 (six) hours as needed (pain).    Yes [provider]  Calcium 600-200 MG-UNIT per tablet Take 1 tablet by mouth daily.     Yes [provider]  Cholecalciferol (VITAMIN D3) 10000 units TABS Take 1,000 Units by mouth daily.   Yes [provider]  dorzolamide-timolol (COSOPT) 22.3-6.8 MG/ML ophthalmic solution Place 1 drop into both eyes 2 (two) times daily.     Yes [provider]  losartan (COZAAR) 50 MG tablet Take 50 mg by mouth daily.  12/25/14  Yes  [provider]  lubiprostone (AMITIZA) 24 MCG capsule Take 24 mcg by mouth 2 (two) times daily with a meal. Takes prn   Yes [provider]  LUMIGAN 0.01 % SOLN Place 1 drop into both eyes at bedtime.  03/06/17  Yes [provider]  meloxicam (MOBIC) 7.5 MG tablet Take 1 tablet (7.5 mg total) daily by mouth. 05/03/17  Yes Carole Civil, MD  Multiple Vitamins-Minerals (PRESERVISION AREDS 2 PO) Take by mouth. One tablet daily   Yes [provider]  NIFEdipine (PROCARDIA-XL/ADALAT-CC/NIFEDICAL-XL) 30 MG 24 hr tablet Take 30 mg by mouth daily. 03/06/17  Yes [provider]  oxybutynin (DITROPAN-XL) 10 MG 24 hr tablet Take 10 mg by mouth daily.    Yes [provider]  Plant Sterols and Stanols (CHOLEST OFF PO) Take 1 tablet by mouth daily.   Yes [provider]  polyethylene glycol (MIRALAX / GLYCOLAX) packet Take 17 g by mouth daily.   Yes [provider]  promethazine (PHENERGAN) 12.5 MG tablet Take 1 tablet (12.5 mg total) by mouth every 6 (six) hours as needed for nausea or vomiting. 11/19/16  Yes Carole Civil, MD  traMADol (ULTRAM) 50 MG tablet Take 1 tablet (50 mg total) by mouth every 6 (six) hours as needed. 05/22/16  Yes Carole Civil, MD  Na Sulfate-K Sulfate-Mg Sulf (SUPREP BOWEL PREP KIT) 17.5-3.13-1.6 GM/180ML SOLN Take 1 kit by mouth as directed. Patient not taking: Reported on 05/19/2017 03/25/17   Daneil Dolin, MD    Review of Systems:  Constitutional: Denies fever, chills, diaphoresis, appetite change and fatigue.  HEENT: Denies photophobia, eye pain, redness, hearing loss, ear pain, congestion, sore throat, rhinorrhea, sneezing, mouth sores, trouble swallowing, neck pain, neck stiffness and tinnitus.   Respiratory: Denies SOB, DOE, cough, chest tightness,  and wheezing.   Cardiovascular: Denies chest pain, palpitations and leg swelling.  Gastrointestinal: Denies nausea, vomiting, abdominal pain,  diarrhea, constipation, and abdominal distention.  Genitourinary: Denies dysuria, urgency, frequency, hematuria, flank pain and difficulty urinating.  Endocrine: Denies: hot or cold intolerance, sweats, changes in hair or nails, polyuria, polydipsia. Musculoskeletal: Denies myalgias, back pain, joint swelling, arthralgias and gait problem.  Skin: Denies pallor, rash and wound.  Neurological: Denies dizziness, seizures, syncope, weakness, light-headedness, numbness and headaches.  Hematological: Denies adenopathy. Easy bruising, personal or family bleeding history  Psychiatric/Behavioral: Denies suicidal ideation, mood changes, confusion, nervousness, sleep disturbance and agitation    Physical Exam: Vitals:   05/19/17 0949 05/19/17 0950 05/19/17 1030  05/19/17 1100  BP:  131/66 126/65 129/63  Pulse:  71 (!) 57 (!) 53  Resp:  18 16 (!) 28  Temp:  97.7 F (36.5 C)    TempSrc:  Oral    SpO2:  99% 99% 100%  Weight: 54.4 kg (120 lb)     Height: 5' 5" (1.651 m)        Constitutional: NAD, calm, comfortable Eyes: PERRL, lids and conjunctivae normal ENMT: Mucous membranes are moist. Posterior pharynx clear of any exudate or lesions.Normal dentition.  Neck: normal, supple, no masses, no thyromegaly Respiratory: clear to auscultation bilaterally, no wheezing, no crackles. Normal respiratory effort. No accessory muscle use.  Cardiovascular: Regular rate and rhythm, no murmurs / rubs / gallops. No extremity edema. 2+ pedal pulses. No carotid bruits.  Abdomen: no tenderness, no masses palpated. No hepatosplenomegaly. Bowel sounds positive.  Musculoskeletal: no clubbing / cyanosis. No joint deformity upper and lower extremities. Good ROM, no contractures. Normal muscle tone.  Skin: no rashes, lesions, ulcers. No induration Neurologic: CN 2-12 grossly intact. Sensation intact, DTR normal. Strength 5/5 in all 4.  Psychiatric: Normal judgment and insight. Alert and oriented x 3. Normal mood.     Labs on Admission: I have personally reviewed the following labs and imaging studies  CBC: Recent Labs  Lab 05/19/17 0958  WBC 6.1  HGB 10.4*  HCT 32.9*  MCV 105.8*  PLT 212   Basic Metabolic Panel: Recent Labs  Lab 05/19/17 0958  NA 142  K 3.5  CL 108  CO2 25  GLUCOSE 111*  BUN 38*  CREATININE 1.64*  CALCIUM 9.6   GFR: Estimated Creatinine Clearance: 19.2 mL/min (A) (by C-G formula based on SCr of 1.64 mg/dL (H)). Liver Function Tests: No results for input(s): AST, ALT, ALKPHOS, BILITOT, PROT, ALBUMIN in the last 168 hours. No results for input(s): LIPASE, AMYLASE in the last 168 hours. No results for input(s): AMMONIA in the last 168 hours. Coagulation Profile: No results for input(s): INR, PROTIME in the last 168 hours. Cardiac Enzymes: No results for input(s): CKTOTAL, CKMB, CKMBINDEX, TROPONINI in the last 168 hours. BNP (last 3 results) No results for input(s): PROBNP in the last 8760 hours. HbA1C: No results for input(s): HGBA1C in the last 72 hours. CBG: No results for input(s): GLUCAP in the last 168 hours. Lipid Profile: No results for input(s): CHOL, HDL, LDLCALC, TRIG, CHOLHDL, LDLDIRECT in the last 72 hours. Thyroid Function Tests: No results for input(s): TSH, T4TOTAL, FREET4, T3FREE, THYROIDAB in the last 72 hours. Anemia Panel: No results for input(s): VITAMINB12, FOLATE, FERRITIN, TIBC, IRON, RETICCTPCT in the last 72 hours. Urine analysis:    Component Value Date/Time   COLORURINE YELLOW 08/05/2009 1533   APPEARANCEUR CLEAR 08/05/2009 1533   LABSPEC 1.020 08/05/2009 1533   PHURINE 5.0 08/05/2009 1533   GLUCOSEU NEGATIVE 08/05/2009 1533   HGBUR NEGATIVE 08/05/2009 1533   BILIRUBINUR NEGATIVE 08/05/2009 1533   KETONESUR NEGATIVE 08/05/2009 1533   PROTEINUR NEGATIVE 08/05/2009 1533   UROBILINOGEN 0.2 08/05/2009 1533   NITRITE NEGATIVE 08/05/2009 1533   LEUKOCYTESUR  08/05/2009 1533    NEGATIVE MICROSCOPIC NOT DONE ON URINES WITH  NEGATIVE PROTEIN, BLOOD, LEUKOCYTES, NITRITE, OR GLUCOSE <1000 mg/dL.   Sepsis Labs: @LABRCNTIP(procalcitonin:4,lacticidven:4) )No results found for this or any previous visit (from the past 240 hour(s)).   Radiological Exams on Admission: No results found.  EKG: Independently reviewed.  None obtained in the ED  Assessment/Plan Principal Problem:   Rectal bleed Active Problems:     HTN (hypertension)   Acute blood loss anemia   CKD (chronic kidney disease) stage 4, GFR 15-29 ml/min (HCC)    Rectal bleeding/acute blood loss anemia -Given recent colonoscopy and the fact that this is painless suspect that this is diverticular bleeding. -We will admit for observation to monitor hemoglobin and ensure there is no need for transfusion. -We will not consult GI as of right now as I suspect there is no need for repeat colonoscopy as she just had one in October 2018. -We will check CBCs every 8 hours and monitor for need of transfusion, placed on IV fluids.  Hypertension -As blood pressure is stable, will continue home medications.  Chronic kidney disease stage IV -Creatinine is at baseline of 1.6-1.7.   DVT prophylaxis: SCDs Code Status: Full code Family Communication: Daughter at bedside updated on plan of care and questions answered Disposition Plan: Likely discharge home in 24 hours Consults called: None Admission status: Observation   Time Spent: 75 minutes   Isaac Bliss MD [facility] Pager [phone]  If 7PM-7AM, please contact night-coverage [email or web address] Password [facility]  05/19/2017, 11:48 AM

## 2017-05-19 NOTE — Progress Notes (Addendum)
Patient had a bloody stool, CBC due at 2000, MD informed. Staff will continue to monitor patient

## 2017-05-19 NOTE — ED Triage Notes (Addendum)
Pt reports 3 BM's with blood this am. Pt reports history of same. Pt denies pain,dizziness. Pt alert and oriented, ambulated to wheelchair. Pt family denies pt being on blood thinners. Per daughter last colonoscopy in October, and that "GI specialist stated for pt to not have anymore colonoscopy due to bleeding risk". Pt family also states that pt has diverticulosis throughout colon.

## 2017-05-19 NOTE — ED Provider Notes (Addendum)
[facility] EMERGENCY DEPARTMENT Provider Note   CSN: [id number] Arrival date & time: 05/19/17  0940     History   Chief Complaint Chief Complaint  Patient presents with  . Rectal Bleeding    HPI Cindy Schultz is a 83 y.o. female.  Patient presents c/o rectal bleeding, onset this AM. 4 episodes, red blood. States moderate amount. Hx colon diverticula and remote hx bleeding from same.  Had colonoscopy 2 months ago, had polyps and diverticula. Denies abdominal pain. No faintness. No sob. Denies fevers.  No other abnormal bruising or bleeding. No anticoag use.    The history is provided by the patient.  Rectal Bleeding  Associated symptoms: no abdominal pain, no epistaxis, no fever and no vomiting     Past Medical History:  Diagnosis Date  . Cancer [facility]) March 2016   Skin Cancer :  Right Cheek     SCC  . Cancer [facility]) 2013   Skin Cancer  :  Anterior Neck   SCC  . Carotid artery occlusion   . Chronic kidney disease   . Diverticulitis   . DVT (deep venous thrombosis) ([place]) 07/14/10   LEA doppler   . GI bleeding    2013/2016: suspected diverticular bleeding  . Glaucoma   . Hearing loss   . HTN (hypertension)    cholesterol  . Lumbar spine pain   . Lymphedema   . Macular degeneration   . Pain in joints   . Precancerous changes of the cervix    lesions  . Rectocele   . SOB (shortness of breath) 04/04/2010   2D echo EF=>55%    Patient Active Problem List   Diagnosis Date Noted  . LLQ pain 03/08/2017  . Colon polyp 03/08/2017  . Constipation 03/08/2017  . Acute blood loss anemia 09/10/2014  . GIB (gastrointestinal bleeding) 09/08/2014  . Occlusion and stenosis of carotid artery without mention of cerebral infarction 12/14/2013  . Osteoarthritis of left knee 04/23/2013  . Thumb pain 10/22/2012  . De Quervain's syndrome (tenosynovitis) 10/22/2012  . [facility] DJD(carpometacarpal degenerative joint disease), localized primary 10/22/2012  . CTS (carpal tunnel  syndrome) 10/22/2012  . Trigger thumb of right hand 10/22/2012  . Pes anserinus bursitis of right knee 06/26/2012  . OA (osteoarthritis) of knee 06/26/2012  . Leg pain, left 06/26/2012  . Radicular pain of left lower extremity 06/26/2012  . Diverticulosis of colon with hemorrhage 09/22/2011  . Syncopal episodes 09/20/2011  . GI bleed 09/19/2011  . HTN (hypertension) 09/19/2011  . Anemia due to blood loss, acute 09/19/2011  . Abnormality of gait 01/03/2011  . Personal history of fall 12/27/2010  . DISLOCATION CLOSED SHOULDER NEC 04/25/2010  . ANSERINE BURSITIS, LEFT 02/13/2010  . KNEE PAIN 12/22/2009  . LEG PAIN, BILATERAL 12/22/2009  . TOTAL KNEE FOLLOW-UP 12/22/2009  . HIP PAIN 02/07/2009  . LOW BACK PAIN 02/07/2009    Past Surgical History:  Procedure Laterality Date  . ABDOMINAL HYSTERECTOMY    . appendix    . bladder tac    . COLONOSCOPY  09/20/2011   Dr. Carol Ada: pandiverticulosis, hemorrhoids. ascending colon polyp not removed in setting of active bleeding.  Marland Kitchen COLONOSCOPY N/A 04/10/2017   Procedure: COLONOSCOPY;  Surgeon: Daneil Dolin, MD;  Location: [facility] ENDO SUITE;  Service: Endoscopy;  Laterality: N/A;  2:00pm - can NOT come earlier  . corneal erosion     cataracts   . elevated metatarsal arch    . fallen uterus    .  hemmorrhoidectomy    . left total knee replacement  05/02/04   [name]  . met osteostomy    . morton's nueroma    . OVARIAN CYST SURGERY    . RECTOCELE REPAIR    . rupture rt groin    . salk    . SKIN CANCER EXCISION Right March 2016  2013   Cheek -  [facility]  and  Anterior Neck  . tracheotomy      OB History    No data available       Home Medications    Prior to Admission medications   Medication Sig Start Date End Date Taking? Authorizing Provider  acetaminophen (TYLENOL) 500 MG tablet Take 500 mg by mouth every 6 (six) hours as needed (pain).     [provider]  Calcium 600-200 MG-UNIT per tablet Take 1 tablet by mouth  daily.      [provider]  Cholecalciferol (VITAMIN D3) 10000 units TABS Take 1,000 Units by mouth daily.    [provider]  dorzolamide-timolol (COSOPT) 22.3-6.8 MG/ML ophthalmic solution Place 1 drop into both eyes 2 (two) times daily.      [provider]  fluticasone (FLONASE) 50 MCG/ACT nasal spray Place 2 sprays into the nose daily.      [provider]  losartan (COZAAR) 50 MG tablet  12/25/14   [provider]  lubiprostone (AMITIZA) 24 MCG capsule Take 24 mcg by mouth 2 (two) times daily with a meal. Takes prn    [provider]  LUMIGAN 0.01 % SOLN  03/06/17   [provider]  meloxicam (MOBIC) 7.5 MG tablet Take 1 tablet (7.5 mg total) daily by mouth. 05/03/17   Carole Civil, MD  Multiple Vitamins-Minerals (OCUVITE PO) Take 1 tablet by mouth daily.    [provider]  Multiple Vitamins-Minerals (PRESERVISION AREDS 2 PO) Take by mouth. One tablet daily    [provider]  Na Sulfate-K Sulfate-Mg Sulf (SUPREP BOWEL PREP KIT) 17.5-3.13-1.6 GM/180ML SOLN Take 1 kit by mouth as directed. 03/25/17   Rourk, Cristopher Estimable, MD  NIFEdipine (PROCARDIA-XL/ADALAT-CC/NIFEDICAL-XL) 30 MG 24 hr tablet Take 30 mg by mouth daily. 03/06/17   [provider]  oxybutynin (DITROPAN-XL) 10 MG 24 hr tablet Take 10 mg by mouth daily.     [provider]  Plant Sterols and Stanols (CHOLEST OFF PO) Take 1 tablet by mouth daily.    [provider]  polyethylene glycol (MIRALAX / GLYCOLAX) packet Take 17 g by mouth daily.    [provider]  promethazine (PHENERGAN) 12.5 MG tablet Take 1 tablet (12.5 mg total) by mouth every 6 (six) hours as needed for nausea or vomiting. 11/19/16   Carole Civil, MD  traMADol (ULTRAM) 50 MG tablet Take 1 tablet (50 mg total) by mouth every 6 (six) hours as needed. 05/22/16   Carole Civil, MD    Family History Family History  Problem Relation Age of Onset   . Stroke Mother   . Diabetes Mother   . Heart attack Father   . COPD Paternal Grandfather   . Dementia Sister   . Colon cancer Neg Hx     Social History Social History   Tobacco Use  . Smoking status: Never Smoker  . Smokeless tobacco: Never Used  Substance Use Topics  . Alcohol use: No  . Drug use: No     Allergies   Promethazine hcl; Codeine; Tramadol hcl; and Hydrocodone  Review of Systems Review of Systems  Constitutional: Negative for fever.  HENT: Negative for nosebleeds.   Eyes: Negative for redness.  Respiratory: Negative for shortness of breath.   Cardiovascular: Negative for chest pain.  Gastrointestinal: Positive for blood in stool and hematochezia. Negative for abdominal pain and vomiting.  Genitourinary: Negative for flank pain and hematuria.  Musculoskeletal: Negative for back pain.  Skin: Negative for rash.  Neurological: Negative for syncope.  Hematological: Does not bruise/bleed easily.  Psychiatric/Behavioral: Negative for confusion.     Physical Exam Updated Vital Signs BP 131/66 (BP Location: Right Arm)   Pulse 71   Temp 97.7 F (36.5 C) (Oral)   Resp 18   Ht 1.651 m (_0 )   Wt 54.4 kg (120 lb)   SpO2 99%   BMI 19.97 kg/m   Physical Exam  Constitutional: She appears well-developed and well-nourished. No distress.  Eyes: Conjunctivae are normal. No scleral icterus.  Neck: Neck supple. No tracheal deviation present.  Cardiovascular: Normal rate, regular rhythm, normal heart sounds and intact distal pulses.  Pulmonary/Chest: Effort normal and breath sounds normal. No respiratory distress.  Abdominal: Soft. Normal appearance and bowel sounds are normal. She exhibits no distension. There is no tenderness.  Genitourinary:  Genitourinary Comments: No stool. +red blood.   Musculoskeletal: She exhibits no edema.  Neurological: She is alert.  Skin: Skin is warm and dry. No rash noted. She is not diaphoretic.  Psychiatric: She has a  normal mood and affect.  Nursing note and vitals reviewed.    ED Treatments / Results  Labs (all labs ordered are listed, but only abnormal results are displayed)  Results for orders placed or performed during the hospital encounter of 05/19/17  CBC  Result Value Ref Range   WBC 6.1 4.0 - 10.5 K/uL   RBC 3.11 (L) 3.87 - 5.11 MIL/uL   Hemoglobin 10.4 (L) 12.0 - 15.0 g/dL   HCT 32.9 (L) 36.0 - 46.0 %   MCV 105.8 (H) 78.0 - 100.0 fL   MCH 33.4 26.0 - 34.0 pg   MCHC 31.6 30.0 - 36.0 g/dL   RDW 13.9 11.5 - 15.5 %   Platelets 212 150 - 400 K/uL  Basic metabolic panel  Result Value Ref Range   Sodium 142 135 - 145 mmol/L   Potassium 3.5 3.5 - 5.1 mmol/L   Chloride 108 101 - 111 mmol/L   CO2 25 22 - 32 mmol/L   Glucose, Bld 111 (H) 65 - 99 mg/dL   BUN 38 (H) 6 - 20 mg/dL   Creatinine, Ser 1.64 (H) 0.44 - 1.00 mg/dL   Calcium 9.6 8.9 - 10.3 mg/dL   GFR calc non Af Amer 26 (L) >60 mL/min   GFR calc Af Amer 30 (L) >60 mL/min   Anion gap 9 5 - 15  POC occult blood, ED Provider will collect  Result Value Ref Range   Fecal Occult Bld POSITIVE (A) NEGATIVE  Type and screen  Result Value Ref Range   ABO/RH(D) A POS    Antibody Screen PENDING    Sample Expiration 05/22/2017      EKG  EKG Interpretation None       Radiology No results found.  Procedures Procedures (including critical care time)  Medications Ordered in ED Medications  sodium chloride 0.9 % bolus 500 mL (not administered)     Initial Impression / Assessment and Plan / ED Course  I have reviewed the triage vital signs and the nursing notes.  Pertinent labs & imaging results that were available during my care of the patient were reviewed by me and considered in my medical decision making (see chart for details).  Iv ns bolus.  Labs sent.  Reviewed nursing notes and prior charts for additional history.   Labs ok.  Given 4 episodes blood, and pt/family stating episodes seemed to them to be large  amount of blood, hx significant diverticula hem - will admit, ivf, repeat hgb.  Hospitalists consulted for admission.      Final Clinical Impressions(s) / ED Diagnoses   Final diagnoses:  None    ED Discharge Orders    None       Lajean Saver, MD 05/19/17 [place], Kazoua Gossen, MD 05/19/17 1058

## 2017-05-20 DIAGNOSIS — H409 Unspecified glaucoma: Secondary | ICD-10-CM | POA: Diagnosis present

## 2017-05-20 DIAGNOSIS — D62 Acute posthemorrhagic anemia: Secondary | ICD-10-CM | POA: Diagnosis not present

## 2017-05-20 DIAGNOSIS — N184 Chronic kidney disease, stage 4 (severe): Secondary | ICD-10-CM | POA: Diagnosis not present

## 2017-05-20 DIAGNOSIS — Z90710 Acquired absence of both cervix and uterus: Secondary | ICD-10-CM | POA: Diagnosis not present

## 2017-05-20 DIAGNOSIS — I10 Essential (primary) hypertension: Secondary | ICD-10-CM | POA: Diagnosis not present

## 2017-05-20 DIAGNOSIS — N183 Chronic kidney disease, stage 3 (moderate): Secondary | ICD-10-CM | POA: Diagnosis not present

## 2017-05-20 DIAGNOSIS — Z833 Family history of diabetes mellitus: Secondary | ICD-10-CM | POA: Diagnosis not present

## 2017-05-20 DIAGNOSIS — Z79899 Other long term (current) drug therapy: Secondary | ICD-10-CM | POA: Diagnosis not present

## 2017-05-20 DIAGNOSIS — Z86010 Personal history of colonic polyps: Secondary | ICD-10-CM | POA: Diagnosis not present

## 2017-05-20 DIAGNOSIS — Z885 Allergy status to narcotic agent status: Secondary | ICD-10-CM | POA: Diagnosis not present

## 2017-05-20 DIAGNOSIS — H9190 Unspecified hearing loss, unspecified ear: Secondary | ICD-10-CM | POA: Diagnosis present

## 2017-05-20 DIAGNOSIS — K625 Hemorrhage of anus and rectum: Secondary | ICD-10-CM | POA: Diagnosis not present

## 2017-05-20 DIAGNOSIS — Z791 Long term (current) use of non-steroidal anti-inflammatories (NSAID): Secondary | ICD-10-CM | POA: Diagnosis not present

## 2017-05-20 DIAGNOSIS — D7589 Other specified diseases of blood and blood-forming organs: Secondary | ICD-10-CM | POA: Diagnosis not present

## 2017-05-20 DIAGNOSIS — Z8249 Family history of ischemic heart disease and other diseases of the circulatory system: Secondary | ICD-10-CM | POA: Diagnosis not present

## 2017-05-20 DIAGNOSIS — Z85828 Personal history of other malignant neoplasm of skin: Secondary | ICD-10-CM | POA: Diagnosis not present

## 2017-05-20 DIAGNOSIS — K559 Vascular disorder of intestine, unspecified: Secondary | ICD-10-CM | POA: Diagnosis present

## 2017-05-20 DIAGNOSIS — Z888 Allergy status to other drugs, medicaments and biological substances status: Secondary | ICD-10-CM | POA: Diagnosis not present

## 2017-05-20 DIAGNOSIS — H3530 Unspecified macular degeneration: Secondary | ICD-10-CM | POA: Diagnosis present

## 2017-05-20 DIAGNOSIS — I129 Hypertensive chronic kidney disease with stage 1 through stage 4 chronic kidney disease, or unspecified chronic kidney disease: Secondary | ICD-10-CM | POA: Diagnosis present

## 2017-05-20 DIAGNOSIS — Z96652 Presence of left artificial knee joint: Secondary | ICD-10-CM | POA: Diagnosis present

## 2017-05-20 DIAGNOSIS — K5731 Diverticulosis of large intestine without perforation or abscess with bleeding: Secondary | ICD-10-CM | POA: Diagnosis not present

## 2017-05-20 LAB — BASIC METABOLIC PANEL
ANION GAP: 6 (ref 5–15)
BUN: 28 mg/dL — AB (ref 6–20)
CALCIUM: 8.5 mg/dL — AB (ref 8.9–10.3)
CO2: 22 mmol/L (ref 22–32)
Chloride: 112 mmol/L — ABNORMAL HIGH (ref 101–111)
Creatinine, Ser: 1.33 mg/dL — ABNORMAL HIGH (ref 0.44–1.00)
GFR calc Af Amer: 39 mL/min — ABNORMAL LOW (ref >60)
GFR, EST NON AFRICAN AMERICAN: 34 mL/min — AB (ref >60)
GLUCOSE: 86 mg/dL (ref 65–99)
POTASSIUM: 3.7 mmol/L (ref 3.5–5.1)
SODIUM: 140 mmol/L (ref 135–145)

## 2017-05-20 LAB — CBC
HCT: 29.3 % — ABNORMAL LOW (ref 36.0–46.0)
HCT: 30 % — ABNORMAL LOW (ref 36.0–46.0)
Hemoglobin: 9.2 g/dL — ABNORMAL LOW (ref 12.0–15.0)
Hemoglobin: 9.4 g/dL — ABNORMAL LOW (ref 12.0–15.0)
MCH: 33.2 pg (ref 26.0–34.0)
MCH: 33.5 pg (ref 26.0–34.0)
MCHC: 31.4 g/dL (ref 30.0–36.0)
MCHC: 31.3 g/dL (ref 30.0–36.0)
MCV: 105.8 fL — AB (ref 78.0–100.0)
MCV: 106.8 fL — ABNORMAL HIGH (ref 78.0–100.0)
PLATELETS: 197 10*3/uL (ref 150–400)
Platelets: 198 10*3/uL (ref 150–400)
RBC: 2.77 MIL/uL — AB (ref 3.87–5.11)
RBC: 2.81 MIL/uL — ABNORMAL LOW (ref 3.87–5.11)
RDW: 14.4 % (ref 11.5–15.5)
RDW: 14.3 % (ref 11.5–15.5)
WBC: 8 10*3/uL (ref 4.0–10.5)
WBC: 9 10*3/uL (ref 4.0–10.5)

## 2017-05-20 NOTE — Progress Notes (Signed)
PROGRESS NOTE    Cindy Schultz  CBU:[id number] DOB: 1923/10/14 DOA: 05/19/2017 PCP: Sinda Du, MD     Brief Narrative:  83 year old woman admitted from home on 12/2 due to rectal bleeding.  She had 4 episodes prior to admission and has had at least 2 episodes since being admitted to the hospital.  She was recently diagnosed with diverticulosis as per colonoscopy just 2 months ago, so suspect this is a painless diverticular bleed.  She is he is elderly and lives independently we have elected to admit her for observation.   Assessment & Plan:   Principal Problem:   Rectal bleed Active Problems:   GI bleed   HTN (hypertension)   Acute blood loss anemia   CKD (chronic kidney disease) stage 4, GFR 15-29 ml/min (HCC)   Rectal bleeding -She continues to have episodes of rectal bleeding, more recently mostly stool mixed with blood, suspect this is blood in transit more so than repeat bleeding. -As she recently had a colonoscopy do not believe there is a role for GI unless she so signs that she continues to actively bleed. -Have not consulted GI as of yet.  Acute blood loss anemia -Secondary to rectal bleed. -Hemoglobin down to 9.2 from 10.4 and admission. -No plans for transfusion as of yet.  Stage IV chronic kidney disease -Creatinine is currently better than baseline at 1.3 -Baseline creatinine is 1.6-1.7..  Hypertension -Fair control, continue home medications.   DVT prophylaxis: SCDs Code Status: Full code Family Communication: Patient only Disposition Plan: Hope for discharge home over the next 24-48 hours  Consultants:   None  Procedures:   None  Antimicrobials:  Anti-infectives (From admission, onward)   None       Subjective: Feels well, no complaints, has had 2 bowel movements with blood today, although much less blood than on admission.  Objective: Vitals:   05/19/17 2023 05/20/17 0527 05/20/17 0700 05/20/17 1414  BP: 122/77 128/64 126/72  (!) 149/62  Pulse: 78 74 70 67  Resp: 16 20 18 16   Temp: 97.8 F (36.6 C) 98.2 F (36.8 C) 98.3 F (36.8 C) 98.3 F (36.8 C)  TempSrc: Oral Oral Oral Oral  SpO2: 95% 94% 97% 100%  Weight:      Height:        Intake/Output Summary (Last 24 hours) at 05/20/2017 1533 Last data filed at 05/20/2017 1250 Gross per 24 hour  Intake 2605 ml  Output -  Net 2605 ml   Filed Weights   05/19/17 0949 05/19/17 1228  Weight: 54.4 kg (120 lb) 56.4 kg (124 lb 5 oz)    Examination:  General exam: Alert, awake, oriented x 3 Respiratory system: Clear to auscultation. Respiratory effort normal. Cardiovascular system:RRR. No murmurs, rubs, gallops. Gastrointestinal system: Abdomen is nondistended, soft and nontender. No organomegaly or masses felt. Normal bowel sounds heard. Central nervous system: Alert and oriented. No focal neurological deficits. Extremities: No C/C/E, +pedal pulses Skin: No rashes, lesions or ulcers Psychiatry: Judgement and insight appear normal. Mood & affect appropriate.     Data Reviewed: I have personally reviewed following labs and imaging studies  CBC: Recent Labs  Lab 05/19/17 0958 05/19/17 1953 05/20/17 0413  WBC 6.1 7.3 8.0  HGB 10.4* 10.1* 9.2*  HCT 32.9* 31.7* 29.3*  MCV 105.8* 104.3* 105.8*  PLT 212 216 032   Basic Metabolic Panel: Recent Labs  Lab 05/19/17 0958 05/20/17 0413  NA 142 140  K 3.5 3.7  CL 108 112*  CO2  25 22  GLUCOSE 111* 86  BUN 38* 28*  CREATININE 1.64* 1.33*  CALCIUM 9.6 8.5*   GFR: Estimated Creatinine Clearance: 24.5 mL/min (A) (by C-G formula based on SCr of 1.33 mg/dL (H)). Liver Function Tests: No results for input(s): AST, ALT, ALKPHOS, BILITOT, PROT, ALBUMIN in the last 168 hours. No results for input(s): LIPASE, AMYLASE in the last 168 hours. No results for input(s): AMMONIA in the last 168 hours. Coagulation Profile: No results for input(s): INR, PROTIME in the last 168 hours. Cardiac Enzymes: No results for  input(s): CKTOTAL, CKMB, CKMBINDEX, TROPONINI in the last 168 hours. BNP (last 3 results) No results for input(s): PROBNP in the last 8760 hours. HbA1C: No results for input(s): HGBA1C in the last 72 hours. CBG: No results for input(s): GLUCAP in the last 168 hours. Lipid Profile: No results for input(s): CHOL, HDL, LDLCALC, TRIG, CHOLHDL, LDLDIRECT in the last 72 hours. Thyroid Function Tests: No results for input(s): TSH, T4TOTAL, FREET4, T3FREE, THYROIDAB in the last 72 hours. Anemia Panel: No results for input(s): VITAMINB12, FOLATE, FERRITIN, TIBC, IRON, RETICCTPCT in the last 72 hours. Urine analysis:    Component Value Date/Time   COLORURINE YELLOW 08/05/2009 [place] 08/05/2009 1533   LABSPEC 1.020 08/05/2009 1533   PHURINE 5.0 08/05/2009 1533   GLUCOSEU NEGATIVE 08/05/2009 1533   HGBUR NEGATIVE 08/05/2009 1533   BILIRUBINUR NEGATIVE 08/05/2009 1533   KETONESUR NEGATIVE 08/05/2009 1533   PROTEINUR NEGATIVE 08/05/2009 1533   UROBILINOGEN 0.2 08/05/2009 1533   NITRITE NEGATIVE 08/05/2009 1533   LEUKOCYTESUR  08/05/2009 1533    NEGATIVE MICROSCOPIC NOT DONE ON URINES WITH NEGATIVE PROTEIN, BLOOD, LEUKOCYTES, NITRITE, OR GLUCOSE <1000 mg/dL.   Sepsis Labs: @LABRCNTIP (procalcitonin:4,lacticidven:4)  )No results found for this or any previous visit (from the past 240 hour(s)).       Radiology Studies: No results found.      Scheduled Meds: . dorzolamide-timolol  1 drop Both Eyes BID  . latanoprost  1 drop Both Eyes QHS  . losartan  50 mg Oral Daily  . lubiprostone  24 mcg Oral BID WC  . NIFEdipine  30 mg Oral Daily  . oxybutynin  10 mg Oral Daily   Continuous Infusions: . sodium chloride    . sodium chloride 75 mL/hr at 05/20/17 1148     LOS: 0 days    Time spent: 25 minutes. Greater than 50% of this time was spent in direct contact with the patient coordinating care.     Lelon Frohlich, MD [facility] Pager  [phone]  If 7PM-7AM, please contact night-coverage [email or web address] Password [facility] 05/20/2017, 3:33 PM

## 2017-05-21 ENCOUNTER — Encounter (HOSPITAL_COMMUNITY): Payer: Self-pay | Admitting: Gastroenterology

## 2017-05-21 DIAGNOSIS — K625 Hemorrhage of anus and rectum: Secondary | ICD-10-CM

## 2017-05-21 LAB — CBC WITH DIFFERENTIAL/PLATELET
Basophils Absolute: 0 10*3/uL (ref 0.0–0.1)
Basophils Relative: 0 %
Eosinophils Absolute: 0.1 10*3/uL (ref 0.0–0.7)
Eosinophils Relative: 1 %
HEMATOCRIT: 28.8 % — AB (ref 36.0–46.0)
HEMOGLOBIN: 9 g/dL — AB (ref 12.0–15.0)
LYMPHS ABS: 2.3 10*3/uL (ref 0.7–4.0)
LYMPHS PCT: 24 %
MCH: 33.2 pg (ref 26.0–34.0)
MCHC: 31.3 g/dL (ref 30.0–36.0)
MCV: 106.3 fL — AB (ref 78.0–100.0)
MONO ABS: 1.2 10*3/uL — AB (ref 0.1–1.0)
MONOS PCT: 12 %
NEUTROS ABS: 6.1 10*3/uL (ref 1.7–7.7)
NEUTROS PCT: 63 %
Platelets: 213 10*3/uL (ref 150–400)
RBC: 2.71 MIL/uL — ABNORMAL LOW (ref 3.87–5.11)
RDW: 14.3 % (ref 11.5–15.5)
WBC: 9.7 10*3/uL (ref 4.0–10.5)

## 2017-05-21 NOTE — Consult Note (Signed)
Referring Provider: Dr. Jerilee Hoh  Primary Care Physician:  Sinda Du, MD Primary Gastroenterologist:  Dr. [name]   Date of Admission: 05/19/17 Date of Consultation: 05/21/17  Reason for Consultation:  Lower GI bleed   HPI:  Cindy Schultz is a 83 y.o. year old female admitted with rectal bleeding and admitting Hgb 10.4. Hgb dropped to 9.2 yesterday and was 9.4 yesterday afternoon. Repeat CBC has been ordered this morning. Suspected diverticular bleed, with recent colonoscopy on file from Apr 10, 2017 noting three tubular adenomas and diverticulosis throughout entire examined colon. She had several episodes of rectal bleeding after admission but appeared to be tapering. Last evidence of bleeding was this morning, dark red mixed with urine.   She denies fever or chills. Notes prior to ED presentation, had 2 large painless bloody bowel movements. Notes sensation of fullness in LLQ but no pain. She tells me that she takes Ibuprofen occasionally as outpatient, and she was started on Mobic approximately a month ago for knee pain. History of chronic constipation.     Past Medical History:  Diagnosis Date  . Cancer [facility]) March 2016   Skin Cancer :  Right Cheek     SCC  . Cancer [facility]) 2013   Skin Cancer  :  Anterior Neck   SCC  . Carotid artery occlusion   . Chronic kidney disease   . Diverticulitis   . DVT (deep venous thrombosis) ([place]) 07/14/10   LEA doppler   . GI bleeding    2013/2016: suspected diverticular bleeding  . Glaucoma   . Hearing loss   . HTN (hypertension)    cholesterol  . Lumbar spine pain   . Lymphedema   . Macular degeneration   . Pain in joints   . Precancerous changes of the cervix    lesions  . Rectocele   . SOB (shortness of breath) 04/04/2010   2D echo EF=>55%    Past Surgical History:  Procedure Laterality Date  . ABDOMINAL HYSTERECTOMY    . appendix    . bladder tac    . COLONOSCOPY  09/20/2011   Dr. Carol Ada: pandiverticulosis,  hemorrhoids. ascending colon polyp not removed in setting of active bleeding.  Mar[facility] Kitchen COLONOSCOPY N/A 04/10/2017   Dr. [name]: three 4-6 mm polyps in ascending colon and cecum, s/p removal. tubular adenomas. diverticulosis in entire examined colon.   . corneal erosion     cataracts   . elevated metatarsal arch    . fallen uterus    . hemmorrhoidectomy    . left total knee replacement  05/02/04   [name]  . met osteostomy    . morton's nueroma    . OVARIAN CYST SURGERY    . RECTOCELE REPAIR    . rupture rt groin    . salk    . SKIN CANCER EXCISION Right March 2016  2013   Cheek -  [facility]  and  Anterior Neck  . tracheotomy      Prior to Admission medications   Medication Sig Start Date End Date Taking? Authorizing Provider  acetaminophen (TYLENOL) 500 MG tablet Take 500 mg by mouth every 6 (six) hours as needed (pain).    Yes [provider]  Calcium 600-200 MG-UNIT per tablet Take 1 tablet by mouth daily.     Yes [provider]  Cholecalciferol (VITAMIN D3) 10000 units TABS Take 1,000 Units by mouth daily.   Yes [provider]  dorzolamide-timolol (COSOPT) 22.3-6.8 MG/ML ophthalmic solution Place 1 drop  into both eyes 2 (two) times daily.     Yes [provider]  losartan (COZAAR) 50 MG tablet Take 50 mg by mouth daily.  12/25/14  Yes [provider]  lubiprostone (AMITIZA) 24 MCG capsule Take 24 mcg by mouth 2 (two) times daily with a meal. Takes prn   Yes [provider]  LUMIGAN 0.01 % SOLN Place 1 drop into both eyes at bedtime.  03/06/17  Yes [provider]  meloxicam (MOBIC) 7.5 MG tablet Take 1 tablet (7.5 mg total) daily by mouth. 05/03/17  Yes Carole Civil, MD  Multiple Vitamins-Minerals (PRESERVISION AREDS 2 PO) Take by mouth. One tablet daily   Yes [provider]  NIFEdipine (PROCARDIA-XL/ADALAT-CC/NIFEDICAL-XL) 30 MG 24 hr tablet Take 30 mg by mouth daily. 03/06/17  Yes [provider]   oxybutynin (DITROPAN-XL) 10 MG 24 hr tablet Take 10 mg by mouth daily.    Yes [provider]  Plant Sterols and Stanols (CHOLEST OFF PO) Take 1 tablet by mouth daily.   Yes [provider]  polyethylene glycol (MIRALAX / GLYCOLAX) packet Take 17 g by mouth daily.   Yes [provider]  promethazine (PHENERGAN) 12.5 MG tablet Take 1 tablet (12.5 mg total) by mouth every 6 (six) hours as needed for nausea or vomiting. 11/19/16  Yes Carole Civil, MD  traMADol (ULTRAM) 50 MG tablet Take 1 tablet (50 mg total) by mouth every 6 (six) hours as needed. 05/22/16  Yes Carole Civil, MD  Na Sulfate-K Sulfate-Mg Sulf (SUPREP BOWEL PREP KIT) 17.5-3.13-1.6 GM/180ML SOLN Take 1 kit by mouth as directed. Patient not taking: Reported on 05/19/2017 03/25/17   Rourk, Cristopher Estimable, MD    Current Facility-Administered Medications  Medication Dose Route Frequency Provider Last Rate Last Dose  . 0.9 %  sodium chloride infusion   Intravenous Once Isaac Bliss, Rayford Halsted, MD      . 0.9 %  sodium chloride infusion   Intravenous Continuous Isaac Bliss, Rayford Halsted, MD 75 mL/hr at 05/21/17 [phone]    . acetaminophen (TYLENOL) tablet 650 mg  650 mg Oral Q6H PRN Isaac Bliss, Rayford Halsted, MD   650 mg at 05/19/17 2210   Or  . acetaminophen (TYLENOL) suppository 650 mg  650 mg Rectal Q6H PRN Isaac Bliss, Rayford Halsted, MD      . dorzolamide-timolol (COSOPT) 22.3-6.8 MG/ML ophthalmic solution 1 drop  1 drop Both Eyes BID Isaac Bliss, Rayford Halsted, MD   1 drop at 05/21/17 0801  . latanoprost (XALATAN) 0.005 % ophthalmic solution 1 drop  1 drop Both Eyes QHS Isaac Bliss, Rayford Halsted, MD      . losartan (COZAAR) tablet 50 mg  50 mg Oral Daily Isaac Bliss, Rayford Halsted, MD   50 mg at 05/21/17 0800  . lubiprostone (AMITIZA) capsule 24 mcg  24 mcg Oral BID WC Isaac Bliss, Rayford Halsted, MD   24 mcg at 05/21/17 0754  . NIFEdipine (PROCARDIA-XL/ADALAT-CC/NIFEDICAL-XL) 24 hr tablet 30 mg  30 mg  Oral Daily Isaac Bliss, Rayford Halsted, MD   30 mg at 05/21/17 0800  . ondansetron (ZOFRAN) tablet 4 mg  4 mg Oral Q6H PRN Isaac Bliss, Rayford Halsted, MD       Or  . ondansetron [facility]) injection 4 mg  4 mg Intravenous Q6H PRN Isaac Bliss, Rayford Halsted, MD      . oxybutynin (DITROPAN-XL) 24 hr tablet 10 mg  10 mg Oral Daily Isaac Bliss, Rayford Halsted, MD  10 mg at 05/21/17 0800  . senna-docusate (Senokot-S) tablet 1 tablet  1 tablet Oral QHS PRN Isaac Bliss, Rayford Halsted, MD   1 tablet at 05/20/17 1042    Allergies as of 05/19/2017 - Review Complete 05/19/2017  Allergen Reaction Noted  . Promethazine hcl Nausea And Vomiting and Other (See Comments)   . Codeine Nausea And Vomiting   . Tramadol hcl Nausea And Vomiting and Other (See Comments)   . Hydrocodone Other (See Comments)     Family History  Problem Relation Age of Onset  . Stroke Mother   . Diabetes Mother   . Heart attack Father   . COPD Paternal Grandfather   . Dementia Sister   . Colon cancer Neg Hx     Social History   Socioeconomic History  . Marital status: Divorced    Spouse name: Not on file  . Number of children: Not on file  . Years of education: Not on file  . Highest education level: Not on file  Social Needs  . Financial resource strain: Not on file  . Food insecurity - worry: Not on file  . Food insecurity - inability: Not on file  . Transportation needs - medical: Not on file  . Transportation needs - non-medical: Not on file  Occupational History  . Not on file  Tobacco Use  . Smoking status: Never Smoker  . Smokeless tobacco: Never Used  Substance and Sexual Activity  . Alcohol use: No  . Drug use: No  . Sexual activity: No  Other Topics Concern  . Not on file  Social History Narrative  . Not on file    Review of Systems: Gen: Denies fever, chills, loss of appetite, change in weight or weight loss CV: Denies chest pain, heart palpitations, syncope, edema  Resp: Denies shortness of  breath with rest, cough, wheezing GI: see HPI  GU : Denies urinary burning, urinary frequency, urinary incontinence.  MS: Denies joint pain,swelling, cramping Derm: Denies rash, itching, dry skin Psych: Denies depression, anxiety,confusion, or memory loss Heme: see HPI   Physical Exam: Vital signs in last 24 hours: Temp:  [97.7 F (36.5 C)-98.3 F (36.8 C)] 97.7 F (36.5 C) (12/04 0703) Pulse Rate:  [66-68] 66 (12/04 0703) Resp:  [16-18] 18 (12/04 0703) BP: (136-149)/(58-62) 136/58 (12/04 0703) SpO2:  [94 %-100 %] 98 % (12/04 0703) Last BM Date: 05/21/17(pt reported around 0700) General:   Alert,  Well-developed, well-nourished, pleasant and cooperative in NAD Head:  Normocephalic and atraumatic. Eyes:  Sclera clear, no icterus.   Conjunctiva pink. Ears:  Mild hard of hearing  Nose:  No deformity, discharge,  or lesions. Mouth:  No deformity or lesions Lungs:  Clear throughout to auscultation.   Heart:  Regular rate and rhythm; no murmurs, Abdomen:  Soft, full in lower abdomen, no TTP, and nondistended. No masses, hepatosplenomegaly or hernias noted. Normal bowel sounds, without guarding, and without rebound.   Rectal:  Deferred  Msk:  Symmetrical without gross deformities. Normal posture. Extremities:  Without  edema. Neurologic:  Alert and  oriented x4 Psych:  Alert and cooperative. Normal mood and affect.  Intake/Output from previous day: 12/03 0701 - 12/04 0700 In: 2527.5 [P.O.:960; I.V.:1567.5] Out: 300 [Urine:300] Intake/Output this shift: No intake/output data recorded.  Lab Results: Recent Labs    05/19/17 1953 05/20/17 0413 05/20/17 1539  WBC 7.3 8.0 9.0  HGB 10.1* 9.2* 9.4*  HCT 31.7* 29.3* 30.0*  PLT 216 197 198   BMET Recent  Labs    05/19/17 0958 05/20/17 0413  NA 142 140  K 3.5 3.7  CL 108 112*  CO2 25 22  GLUCOSE 111* 86  BUN 38* 28*  CREATININE 1.64* 1.33*  CALCIUM 9.6 8.5*    Impression: 83 year old female with known diverticulosis,  recent colonoscopy in Oct 2018, presenting with likely diverticular bleed. She endorses both Ibuprofen and Mobic as outpatient. Bleeding seemed to have tapered yesterday, but she had further moderate amount of dark blood this morning. Hemodynamically stable, and a repeat CBC has been ordered for this morning. No significant drops in Hgb during admission. As of note, Hgb in 11 range 2017. Recommend avoidance of NSAIDs if at all possible as outpatient, continue bowel regimen with Amitiza and additional OTC agents for constipation, and monitor for any worsening of anemia and/or worsening lower GI bleeding. Continue supportive care for now. Will allow full liquids for now. Hold on endoscopic procedure for now but will continue to follow with you.    Plan: Check CBC now Avoid NSAIDs as outpatient Amitiza 24 mcg BID: hold if diarrhea Will continue to follow with you May advance to full liquids   Annitta Needs, PhD, ANP-BC [facility]      LOS: 1 day    05/21/2017, 11:48 AM

## 2017-05-21 NOTE — Progress Notes (Addendum)
Patient's daughter missed talking to MD yesterday. She is a dialysis nurse and has to work today, and asks that Dr. Jerilee Hoh call her once she sees the patient and let her know her plans. Daughter's number, [name],  is [phone].

## 2017-05-21 NOTE — Progress Notes (Signed)
PROGRESS NOTE    Cindy Schultz  ZOX:[id number] DOB: [birth year] DOA: 05/19/2017 PCP: Sinda Du, MD     Brief Narrative:  83 year old woman admitted from home on 12/2 due to rectal bleeding.  She had 4 episodes prior to admission and has had at least 2 episodes since being admitted to the hospital.  She was recently diagnosed with diverticulosis as per colonoscopy just 2 months ago, so suspect this is a painless diverticular bleed.  She is he is elderly and lives independently we have elected to admit her for observation.  She continues to have episodes of rectal bleeding.  GI has been consulted on 12/4.   Assessment & Plan:   Principal Problem:   Rectal bleed Active Problems:   GI bleed   HTN (hypertension)   Acute blood loss anemia   CKD (chronic kidney disease) stage 4, GFR 15-29 ml/min (HCC)   Rectal bleeding -She continues to have episodes of rectal bleeding, see picture from episode she shows me as I walk in the room.     -She had a recent colonoscopy in October with several polyps removed and pandiverticulosis. -Had initially planned on not consulting GI however since patient has continued to bleed will request their input.  Acute blood loss anemia -Secondary to rectal bleed. -Hemoglobin down to 9. from 10.4 on admission. -No plans for transfusion as of yet. -She should have 2 units of PRBCs on hold in case she needs it.  Stage IV chronic kidney disease -Creatinine is currently better than baseline at 1.3 -Baseline creatinine is 1.6-1.7. -Recheck renal function in a.m.  Hypertension -Fair control, continue home medications.   DVT prophylaxis: SCDs Code Status: Full code Family Communication: Patient only Disposition Plan: Hope for discharge home over the next 24-48 hours  Consultants:   None  Procedures:   None  Antimicrobials:  Anti-infectives (From admission, onward)   None       Subjective: She feels well, has had several episodes  of rectal bleeding throughout the night and this morning.  Objective: Vitals:   05/20/17 2005 05/20/17 2025 05/21/17 0703 05/21/17 1408  BP:  (!) 144/61 (!) 136/58 (!) 158/74  Pulse:  68 66 82  Resp:  18 18 20   Temp:  97.7 F (36.5 C) 97.7 F (36.5 C) 97.8 F (36.6 C)  TempSrc:  Oral Oral Oral  SpO2: 94% 100% 98% 100%  Weight:      Height:        Intake/Output Summary (Last 24 hours) at 05/21/2017 1451 Last data filed at 05/21/2017 1200 Gross per 24 hour  Intake 1598.75 ml  Output 600 ml  Net 998.75 ml   Filed Weights   05/19/17 0949 05/19/17 1228  Weight: 54.4 kg (120 lb) 56.4 kg (124 lb 5 oz)    Examination:  General exam: Alert, awake, oriented x 3 Respiratory system: Clear to auscultation. Respiratory effort normal. Cardiovascular system:RRR. No murmurs, rubs, gallops. Gastrointestinal system: Abdomen is nondistended, soft and nontender. No organomegaly or masses felt. Normal bowel sounds heard. Central nervous system: Alert and oriented. No focal neurological deficits. Extremities: No C/C/E, +pedal pulses Skin: No rashes, lesions or ulcers Psychiatry: Judgement and insight appear normal. Mood & affect appropriate.      Data Reviewed: I have personally reviewed following labs and imaging studies  CBC: Recent Labs  Lab 05/19/17 0958 05/19/17 1953 05/20/17 0413 05/20/17 1539 05/21/17 1207  WBC 6.1 7.3 8.0 9.0 9.7  NEUTROABS  --   --   --   --  6.1  HGB 10.4* 10.1* 9.2* 9.4* 9.0*  HCT 32.9* 31.7* 29.3* 30.0* 28.8*  MCV 105.8* 104.3* 105.8* 106.8* 106.3*  PLT 212 216 197 198 505   Basic Metabolic Panel: Recent Labs  Lab 05/19/17 0958 05/20/17 0413  NA 142 140  K 3.5 3.7  CL 108 112*  CO2 25 22  GLUCOSE 111* 86  BUN 38* 28*  CREATININE 1.64* 1.33*  CALCIUM 9.6 8.5*   GFR: Estimated Creatinine Clearance: 24.5 mL/min (A) (by C-G formula based on SCr of 1.33 mg/dL (H)). Liver Function Tests: No results for input(s): AST, ALT, ALKPHOS, BILITOT,  PROT, ALBUMIN in the last 168 hours. No results for input(s): LIPASE, AMYLASE in the last 168 hours. No results for input(s): AMMONIA in the last 168 hours. Coagulation Profile: No results for input(s): INR, PROTIME in the last 168 hours. Cardiac Enzymes: No results for input(s): CKTOTAL, CKMB, CKMBINDEX, TROPONINI in the last 168 hours. BNP (last 3 results) No results for input(s): PROBNP in the last 8760 hours. HbA1C: No results for input(s): HGBA1C in the last 72 hours. CBG: No results for input(s): GLUCAP in the last 168 hours. Lipid Profile: No results for input(s): CHOL, HDL, LDLCALC, TRIG, CHOLHDL, LDLDIRECT in the last 72 hours. Thyroid Function Tests: No results for input(s): TSH, T4TOTAL, FREET4, T3FREE, THYROIDAB in the last 72 hours. Anemia Panel: No results for input(s): VITAMINB12, FOLATE, FERRITIN, TIBC, IRON, RETICCTPCT in the last 72 hours. Urine analysis:    Component Value Date/Time   COLORURINE YELLOW 08/05/2009 [place] 08/05/2009 1533   LABSPEC 1.020 08/05/2009 1533   PHURINE 5.0 08/05/2009 1533   GLUCOSEU NEGATIVE 08/05/2009 1533   HGBUR NEGATIVE 08/05/2009 1533   BILIRUBINUR NEGATIVE 08/05/2009 1533   KETONESUR NEGATIVE 08/05/2009 1533   PROTEINUR NEGATIVE 08/05/2009 1533   UROBILINOGEN 0.2 08/05/2009 1533   NITRITE NEGATIVE 08/05/2009 1533   LEUKOCYTESUR  08/05/2009 1533    NEGATIVE MICROSCOPIC NOT DONE ON URINES WITH NEGATIVE PROTEIN, BLOOD, LEUKOCYTES, NITRITE, OR GLUCOSE <1000 mg/dL.   Sepsis Labs: @LABRCNTIP (procalcitonin:4,lacticidven:4)  )No results found for this or any previous visit (from the past 240 hour(s)).       Radiology Studies: No results found.      Scheduled Meds: . dorzolamide-timolol  1 drop Both Eyes BID  . latanoprost  1 drop Both Eyes QHS  . losartan  50 mg Oral Daily  . lubiprostone  24 mcg Oral BID WC  . NIFEdipine  30 mg Oral Daily  . oxybutynin  10 mg Oral Daily   Continuous Infusions: .  sodium chloride    . sodium chloride 75 mL/hr at 05/21/17 0734     LOS: 1 day    Time spent: 25 minutes. Greater than 50% of this time was spent in direct contact with the patient coordinating care.     Lelon Frohlich, MD [facility] Pager [phone]  If 7PM-7AM, please contact night-coverage [email or web address] Password [email or web address] 05/21/2017, 2:51 PM

## 2017-05-22 DIAGNOSIS — K5731 Diverticulosis of large intestine without perforation or abscess with bleeding: Secondary | ICD-10-CM

## 2017-05-22 DIAGNOSIS — N183 Chronic kidney disease, stage 3 unspecified: Secondary | ICD-10-CM

## 2017-05-22 DIAGNOSIS — D62 Acute posthemorrhagic anemia: Secondary | ICD-10-CM

## 2017-05-22 DIAGNOSIS — D7589 Other specified diseases of blood and blood-forming organs: Secondary | ICD-10-CM

## 2017-05-22 LAB — CBC
HEMATOCRIT: 24.6 % — AB (ref 36.0–46.0)
HEMOGLOBIN: 7.7 g/dL — AB (ref 12.0–15.0)
MCH: 33.2 pg (ref 26.0–34.0)
MCHC: 31.3 g/dL (ref 30.0–36.0)
MCV: 106 fL — ABNORMAL HIGH (ref 78.0–100.0)
Platelets: 192 10*3/uL (ref 150–400)
RBC: 2.32 MIL/uL — ABNORMAL LOW (ref 3.87–5.11)
RDW: 14.4 % (ref 11.5–15.5)
WBC: 6.7 10*3/uL (ref 4.0–10.5)

## 2017-05-22 LAB — HEMOGLOBIN AND HEMATOCRIT, BLOOD
HCT: 26.6 % — ABNORMAL LOW (ref 36.0–46.0)
Hemoglobin: 8.5 g/dL — ABNORMAL LOW (ref 12.0–15.0)

## 2017-05-22 LAB — VITAMIN B12: Vitamin B-12: 1151 pg/mL — ABNORMAL HIGH (ref 180–914)

## 2017-05-22 NOTE — Progress Notes (Signed)
PROGRESS NOTE  Cindy Schultz UDJ:[id number] DOB: 01-Nov-1923 DOA: 05/19/2017 PCP: Sinda Du, MD  Brief History:  83 year old female with a history of CKD stage IV, hypertension, diverticular GI bleed presented on May 19, 2017 secondary to 4 episodes of hematochezia.  The patient had a colonoscopy on April 10, 2017 which revealed 3 polyps which were removed and pandiverticulosis.  The patient's hemoglobin was 10.4 at the time of presentation.  The patient continued to have some bloody stools since admission.  The patient occasionally takes ibuprofen in the outpatient setting, and she was started on meloxicam approximately 1 month prior to admission for knee pain.  GI was consulted to help with management.  Assessment/Plan: Hematochezia/lower GI bleed -Likely diverticular bleed -Appreciate GI follow-up -Overall volume of bleeding seems to be improving -agree with CT abd if abd pain persists/worsens  Acute blood loss anemia -Secondary to lower GI bleed -Hemoglobin dropped in part due to dilution from fluids  CKD stage III -Baseline creatinine 1.6-1.7 -Monitor BMP  Essential hypertension -Continue losartan, nifedipine    Disposition Plan:   Home when cleared by GI Family Communication:   Family at bedside  Consultants:  GI  Code Status:  FULL   DVT Prophylaxis:  SCDs   Procedures: As Listed in Progress Note Above  Antibiotics: None    Subjective: Patient complains of some pressure in the left lower quadrant of the abdomen.  She still having a small amount of blood on her toilet paper.  She denies any fevers, chills, chest pain, shortness breath, dizziness, nausea, vomiting, diarrhea.  No dysuria or hematuria.  Objective: Vitals:   05/21/17 0703 05/21/17 1408 05/21/17 2000 05/22/17 0532  BP: (!) 136/58 (!) 158/74 (!) 126/56 (!) 148/76  Pulse: 66 82 79 100  Resp: 18 20 18 18   Temp: 97.7 F (36.5 C) 97.8 F (36.6 C) (!) 97.5 F (36.4 C) (!)  97.5 F (36.4 C)  TempSrc: Oral Oral Oral Oral  SpO2: 98% 100% 99% 98%  Weight:      Height:        Intake/Output Summary (Last 24 hours) at 05/22/2017 1147 Last data filed at 05/22/2017 0900 Gross per 24 hour  Intake 1175 ml  Output 300 ml  Net 875 ml   Weight change:  Exam:   General:  Pt is alert, follows commands appropriately, not in acute distress  HEENT: No icterus, No thrush, No neck mass, [place]/AT  Cardiovascular: RRR, S1/S2, no rubs, no gallops  Respiratory: CTA bilaterally, no wheezing, no crackles, no rhonchi  Abdomen: Soft/+BS, mild LLQ  tender, non distended, no guarding  Extremities: No edema, No lymphangitis, No petechiae, No rashes, no synovitis   Data Reviewed: I have personally reviewed following labs and imaging studies Basic Metabolic Panel: Recent Labs  Lab 05/19/17 0958 05/20/17 0413  NA 142 140  K 3.5 3.7  CL 108 112*  CO2 25 22  GLUCOSE 111* 86  BUN 38* 28*  CREATININE 1.64* 1.33*  CALCIUM 9.6 8.5*   Liver Function Tests: No results for input(s): AST, ALT, ALKPHOS, BILITOT, PROT, ALBUMIN in the last 168 hours. No results for input(s): LIPASE, AMYLASE in the last 168 hours. No results for input(s): AMMONIA in the last 168 hours. Coagulation Profile: No results for input(s): INR, PROTIME in the last 168 hours. CBC: Recent Labs  Lab 05/19/17 1953 05/20/17 0413 05/20/17 1539 05/21/17 1207 05/22/17 0616  WBC 7.3 8.0 9.0 9.7 6.7  NEUTROABS  --   --   --  6.1  --   HGB 10.1* 9.2* 9.4* 9.0* 7.7*  HCT 31.7* 29.3* 30.0* 28.8* 24.6*  MCV 104.3* 105.8* 106.8* 106.3* 106.0*  PLT 216 197 198 213 192   Cardiac Enzymes: No results for input(s): CKTOTAL, CKMB, CKMBINDEX, TROPONINI in the last 168 hours. BNP: Invalid input(s): POCBNP CBG: No results for input(s): GLUCAP in the last 168 hours. HbA1C: No results for input(s): HGBA1C in the last 72 hours. Urine analysis:    Component Value Date/Time   COLORURINE YELLOW 08/05/2009 [place] 08/05/2009 1533   LABSPEC 1.020 08/05/2009 1533   PHURINE 5.0 08/05/2009 1533   GLUCOSEU NEGATIVE 08/05/2009 1533   HGBUR NEGATIVE 08/05/2009 1533   BILIRUBINUR NEGATIVE 08/05/2009 1533   KETONESUR NEGATIVE 08/05/2009 1533   PROTEINUR NEGATIVE 08/05/2009 1533   UROBILINOGEN 0.2 08/05/2009 1533   NITRITE NEGATIVE 08/05/2009 1533   LEUKOCYTESUR  08/05/2009 1533    NEGATIVE MICROSCOPIC NOT DONE ON URINES WITH NEGATIVE PROTEIN, BLOOD, LEUKOCYTES, NITRITE, OR GLUCOSE <1000 mg/dL.   Sepsis Labs: @LABRCNTIP (procalcitonin:4,lacticidven:4) )No results found for this or any previous visit (from the past 240 hour(s)).   Scheduled Meds: . dorzolamide-timolol  1 drop Both Eyes BID  . latanoprost  1 drop Both Eyes QHS  . losartan  50 mg Oral Daily  . lubiprostone  24 mcg Oral BID WC  . NIFEdipine  30 mg Oral Daily  . oxybutynin  10 mg Oral Daily   Continuous Infusions: . sodium chloride Stopped (05/21/17 1502)  . sodium chloride 75 mL/hr at 05/21/17 2138    Procedures/Studies: No results found.  Orson Eva, DO  [facility] Pager [phone]  If 7PM-7AM, please contact night-coverage [email or web address] Password [email or web address] 05/22/2017, 11:47 AM   LOS: 2 days

## 2017-05-22 NOTE — Care Management Note (Signed)
Case Management Note  Patient Details  Name: Cindy Schultz MRN: [id number] Date of Birth: 1924-05-25  Subjective/Objective:   Adm with rectal bleed. From home alone, ind with ADL's. Has family support, daughter is a Marine scientist.                 Action/Plan: Anticipate DC home with self care. GI following.    Expected Discharge Date:  05/21/17               Expected Discharge Plan:     In-House Referral:     Discharge planning Services  CM Consult  Post Acute Care Choice:    Choice offered to:     DME Arranged:    DME Agency:     HH Arranged:    HH Agency:     Status of Service:  In process, will continue to follow  If discussed at Long Length of Stay Meetings, dates discussed:    Additional Comments:  Cindy Schultz, Chauncey Reading, RN 05/22/2017, 12:06 PM

## 2017-05-22 NOTE — Progress Notes (Signed)
Pt taken to bathroom and noted to be unsteady and said she felt a little dizzy. Pt voided and wiped. There was dark red blood from her rectum but little to none when she wiped again. Pt is concerned about the blood and wants to know what is going to be done about her situation. Call light within reach, bed in lowest position. Will continue to monitor.

## 2017-05-22 NOTE — Evaluation (Signed)
Physical Therapy Evaluation Patient Details Name: Cindy Schultz MRN: [id number] DOB: 05/13/1924 Today's Date: 05/22/2017   History of Present Illness  Cindy Schultz is a 83 y.o. female who lives independently who has a past medical history of hypertension, arthritis, as well as prior history of GI bleeding who just recently had a colonoscopy 2 months ago with reports of pandiverticulosis and a couple polyps that were removed.  She comes in today after having 4 episodes of rectal bleeding that started this morning at around 7, last episode at around 9:30 AM.  She states that initially it was stool mixed with blood and subsequently just red blood.  She never felt dizzy or lightheaded or short of breath.  She called her daughter who advised her to come to the hospital for further evaluation.  Her vital signs are stable, she is not orthostatic, her hemoglobin is 10.4, creatinine is 1.64 which is her baseline.  Admission is requested.    Clinical Impression  Patient unsteady on feet and can be a fall risk if fatigued, limited for gait training due to fatigue and required assistance to sit up at bedside.  Patient will benefit from continued physical therapy in hospital and recommended venue below to increase strength, balance, endurance for safe ADLs and gait.    Follow Up Recommendations SNF;Supervision for mobility/OOB    Equipment Recommendations  None recommended by PT    Recommendations for Other Services       Precautions / Restrictions Precautions Precautions: Fall Restrictions Weight Bearing Restrictions: No      Mobility  Bed Mobility Overal bed mobility: Needs Assistance Bed Mobility: Supine to Sit     Supine to sit: Min assist     General bed mobility comments: difficulty sitting up on elbows  Transfers Overall transfer level: Needs assistance Equipment used: Rolling walker (2 wheeled);1 person hand held assist Transfers: Sit to/from [employer] Sit to Stand: Min guard Stand pivot transfers: Min guard       General transfer comment: unsteady requires hand held assist or use of RW  Ambulation/Gait Ambulation/Gait assistance: Min assist Ambulation Distance (Feet): 25 Feet Assistive device: Rolling walker (2 wheeled) Gait Pattern/deviations: Decreased step length - left;Decreased stride length Gait velocity: slow Gait velocity interpretation: Below normal speed for age/gender General Gait Details: demonstrates slow labored slightly unsteady cadence, no loss of balance, limited secondary to c/o fatigue  Stairs            Wheelchair Mobility    Modified Rankin (Stroke Patients Only)       Balance Overall balance assessment: Needs assistance Sitting-balance support: Feet supported Sitting balance-Leahy Scale: Good     Standing balance support: No upper extremity supported;During functional activity Standing balance-Leahy Scale: Fair Standing balance comment: fair/poor without assistived device, fair using RW with BUE supported                             Pertinent Vitals/Pain Pain Assessment: 0-10 Pain Score: 6  Pain Location: 6-7/10 left side of abdomen Pain Descriptors / Indicators: Pressure;Discomfort Pain Intervention(s): Limited activity within patient's tolerance;Monitored during session    [place] expects to be discharged to:: Private residence Living Arrangements: Alone Available Help at Discharge: Family Type of Home: House Home Access: Stairs to enter Entrance Stairs-Rails: Right;Left;Can reach both Entrance Stairs-Number of Steps: 3(into carport) Home Layout: One level Home Equipment: Environmental consultant - 2 wheels;Cane - single point;Bedside commode;Shower seat;Wheelchair -  manual      Prior Function Level of Independence: Independent with assistive device(s)         Comments: ambulates with SPC     Hand Dominance        Extremity/Trunk Assessment    Upper Extremity Assessment Upper Extremity Assessment: Generalized weakness    Lower Extremity Assessment Lower Extremity Assessment: Generalized weakness    Cervical / Trunk Assessment Cervical / Trunk Assessment: Normal  Communication   Communication: No difficulties  Cognition Arousal/Alertness: Awake/alert Behavior During Therapy: WFL for tasks assessed/performed Overall Cognitive Status: Within Functional Limits for tasks assessed                                        General Comments      Exercises     Assessment/Plan    PT Assessment Patient needs continued PT services  PT Problem List Decreased strength;Decreased activity tolerance;Decreased balance;Decreased mobility       PT Treatment Interventions Gait training;Functional mobility training;Stair training;Therapeutic activities;Therapeutic exercise;Patient/family education    PT Goals (Current goals can be found in the Care Plan section)  Acute Rehab PT Goals Patient Stated Goal: return home PT Goal Formulation: With patient Time For Goal Achievement: 05/27/17 Potential to Achieve Goals: Good    Frequency Min 3X/week   Barriers to discharge        Co-evaluation               AM-PAC PT "6 Clicks" Daily Activity  Outcome Measure Difficulty turning over in bed (including adjusting bedclothes, sheets and blankets)?: A Little Difficulty moving from lying on back to sitting on the side of the bed? : A Little Difficulty sitting down on and standing up from a chair with arms (e.g., wheelchair, bedside commode, etc,.)?: A Little Help needed moving to and from a bed to chair (including a wheelchair)?: A Little Help needed walking in hospital room?: A Little Help needed climbing 3-5 steps with a railing? : A Lot 6 Click Score: 17    End of Session   Activity Tolerance: Patient tolerated treatment well;Patient limited by fatigue Patient left: in chair;with call bell/phone within  reach Nurse Communication: Mobility status PT Visit Diagnosis: Unsteadiness on feet (R26.81);Other abnormalities of gait and mobility (R26.89);Muscle weakness (generalized) (M62.81)    Time: 1157-2620 PT Time Calculation (min) (ACUTE ONLY): 26 min   Charges:   PT Evaluation $PT Eval Moderate Complexity: 1 Mod PT Treatments $Therapeutic Activity: 23-37 mins   PT G Codes:        3:56 PM, 06/05/17 Lonell Grandchild, MPT Physical Therapist with [facility] [phone] office [phone] mobile phone

## 2017-05-22 NOTE — Progress Notes (Signed)
Small, bloody bm today in bedside toilet.

## 2017-05-22 NOTE — Progress Notes (Addendum)
  Subjective:  Patient complains of having to strain with BM this morning. Small amt of blood in tissue and in toilet. Over the past 24 hours her bleeding has improved but some small volume present today. Complains of some Left sided pain this am. Patient very difficult historian this morning, redirected history keeping her focused on current events. Denies lightheadedness or dizziness. No chest pain. Agrees to blood transfusion if needed.   Objective: Vital signs in last 24 hours: Temp:  [97.5 F (36.4 C)-97.8 F (36.6 C)] 97.5 F (36.4 C) (12/05 0532) Pulse Rate:  [79-100] 100 (12/05 0532) Resp:  [18-20] 18 (12/05 0532) BP: (126-158)/(56-76) 148/76 (12/05 0532) SpO2:  [98 %-100 %] 98 % (12/05 0532) Last BM Date: 05/21/17 General:   Alert,  Well-developed, well-nourished, pleasant and cooperative in NAD Head:  Normocephalic and atraumatic. Eyes:  Sclera clear, no icterus.  Abdomen:  Soft, mild left sided tenderness and nondistended. No masses, hepatosplenomegaly or hernias noted. Normal bowel sounds, without guarding, and without rebound.   Extremities:  Without clubbing, deformity or edema. Neurologic:  Alert and  oriented x4;  grossly normal neurologically. Skin:  Intact without significant lesions or rashes. Psych:  Alert and cooperative. Normal mood and affect.  Intake/Output from previous day: 12/04 0701 - 12/05 0700 In: 702 [P.O.:240; I.V.:695] Out: 300 [Urine:300] Intake/Output this shift: No intake/output data recorded.  Lab Results: CBC Recent Labs    05/20/17 1539 05/21/17 1207 05/22/17 0616  WBC 9.0 9.7 6.7  HGB 9.4* 9.0* 7.7*  HCT 30.0* 28.8* 24.6*  MCV 106.8* 106.3* 106.0*  PLT 198 213 192   BMET Recent Labs    05/19/17 0958 05/20/17 0413  NA 142 140  K 3.5 3.7  CL 108 112*  CO2 25 22  GLUCOSE 111* 86  BUN 38* 28*  CREATININE 1.64* 1.33*  CALCIUM 9.6 8.5*   LFTs No results for input(s): BILITOT, BILIDIR, IBILI, ALKPHOS, AST, ALT, PROT, ALBUMIN  in the last 72 hours. No results for input(s): LIPASE in the last 72 hours. PT/INR No results for input(s): LABPROT, INR in the last 72 hours.    Imaging Studies: No results found.[2 weeks]   Assessment: 83 year old female admitted with rectal bleeding with recent colonoscopy in October noting 3 tubular adenomas and diverticulosis throughout the entire colon.  Similar bleeding back in 2013 felt to be diverticular in nature at the time.  Admitting hemoglobin 10.4, down to 7.7 today.  Baseline hemoglobin unknown.  MCV elevated at 106.  Complains of some vague left-sided abdominal pain this morning.  Small volume hematochezia this morning.  Plan: 1. Monitor abdominal pain, if persisting may consider CT scan. 2. Check B12 and folate. 3. Repeat hemoglobin at noon, if remains less than 8, consider giving 1 unit of packed red blood cells.  Currently patient is fairly asymptomatic. 4. Avoid NSAIDs. 5. Consider monitoring throughout today for further bleeding, possibly home tomorrow if stable.  Laureen Ochs. Bernarda Caffey [facility] [phone] 12/5/201810:32 AM     LOS: 2 days    Addendum: repeat H/H earlier today showed Hgb 8.5. Hold off on transfusion today, recheck in am.  Laureen Ochs. Bernarda Caffey [facility] [phone] 12/5/20187:28 PM

## 2017-05-22 NOTE — Plan of Care (Signed)
Pt alert and oriented x4. Hard of hearing. Able to make needs known. No complaint of pain or distress at this time. SCDs refused by pt. Call light within reach, bed in lowest position. Will continue to monitor.

## 2017-05-22 NOTE — Plan of Care (Signed)
  Acute Rehab PT Goals(only PT should resolve) Pt Will Go Supine/Side To Sit 05/22/2017 1557 - Progressing by Lonell Grandchild, PT Flowsheets Taken 05/22/2017 1557  Pt will go Supine/Side to Sit with modified independence Pt Will Go Sit To Supine/Side 05/22/2017 1557 - Progressing by Lonell Grandchild, PT Flowsheets Taken 05/22/2017 1557  Pt will go Sit to Supine/Side with modified independence Patient Will Transfer Sit To/From Stand 05/22/2017 1557 - Progressing by Lonell Grandchild, PT Flowsheets Taken 05/22/2017 1557  Patient will transfer sit to/from stand with supervision Pt Will Transfer Bed To Chair/Chair To Bed 05/22/2017 1557 - Progressing by Lonell Grandchild, PT Flowsheets Taken 05/22/2017 1557  Pt will Transfer Bed to Chair/Chair to Bed with supervision Pt Will Ambulate 05/22/2017 1557 - Progressing by Lonell Grandchild, PT Flowsheets Taken 05/22/2017 1557  Pt will Ambulate 75 feet;with supervision;with rolling walker;with cane Pt Will Go Up/Down Stairs 05/22/2017 1557 - Progressing by Lonell Grandchild, PT Flowsheets Taken 05/22/2017 1557  Pt will Go Up / Down Stairs 3-5 stairs;with rail(s);with supervision  3:59 PM, 05/22/17 Lonell Grandchild, MPT Physical Therapist with [facility] [phone] office [phone] mobile phone

## 2017-05-23 DIAGNOSIS — N183 Chronic kidney disease, stage 3 (moderate): Secondary | ICD-10-CM

## 2017-05-23 LAB — BASIC METABOLIC PANEL
ANION GAP: 6 (ref 5–15)
BUN: 24 mg/dL — AB (ref 6–20)
CHLORIDE: 117 mmol/L — AB (ref 101–111)
CO2: 18 mmol/L — ABNORMAL LOW (ref 22–32)
Calcium: 8.1 mg/dL — ABNORMAL LOW (ref 8.9–10.3)
Creatinine, Ser: 1.24 mg/dL — ABNORMAL HIGH (ref 0.44–1.00)
GFR calc Af Amer: 43 mL/min — ABNORMAL LOW (ref >60)
GFR calc non Af Amer: 37 mL/min — ABNORMAL LOW (ref >60)
GLUCOSE: 101 mg/dL — AB (ref 65–99)
POTASSIUM: 3.7 mmol/L (ref 3.5–5.1)
SODIUM: 141 mmol/L (ref 135–145)

## 2017-05-23 LAB — CBC
HCT: 23.3 % — ABNORMAL LOW (ref 36.0–46.0)
HEMOGLOBIN: 7.2 g/dL — AB (ref 12.0–15.0)
MCH: 32.6 pg (ref 26.0–34.0)
MCHC: 30.9 g/dL (ref 30.0–36.0)
MCV: 105.4 fL — AB (ref 78.0–100.0)
Platelets: 203 10*3/uL (ref 150–400)
RBC: 2.21 MIL/uL — AB (ref 3.87–5.11)
RDW: 14.5 % (ref 11.5–15.5)
WBC: 8.5 10*3/uL (ref 4.0–10.5)

## 2017-05-23 LAB — TYPE AND SCREEN
ABO/RH(D): A POS
Antibody Screen: NEGATIVE
Unit division: 0
Unit division: 0

## 2017-05-23 LAB — HEMOGLOBIN AND HEMATOCRIT, BLOOD
HEMATOCRIT: 28.4 % — AB (ref 36.0–46.0)
HEMOGLOBIN: 9.2 g/dL — AB (ref 12.0–15.0)

## 2017-05-23 LAB — BPAM RBC
Blood Product Expiration Date: 201812192359
Blood Product Expiration Date: 201812242359
UNIT TYPE AND RH: 6200
UNIT TYPE AND RH: 6200

## 2017-05-23 LAB — FOLATE RBC
Folate, Hemolysate: 390 ng/mL
Folate, RBC: 1573 ng/mL (ref >498)
Hematocrit: 24.8 % — ABNORMAL LOW (ref 34.0–46.6)

## 2017-05-23 LAB — PREPARE RBC (CROSSMATCH)

## 2017-05-23 MED ORDER — ACETAMINOPHEN 325 MG PO TABS
650.0000 mg | ORAL_TABLET | Freq: Once | ORAL | Status: AC
  Administered 2017-05-23: 650 mg via ORAL
  Filled 2017-05-23: qty 2

## 2017-05-23 MED ORDER — DIPHENHYDRAMINE HCL 25 MG PO CAPS
25.0000 mg | ORAL_CAPSULE | Freq: Once | ORAL | Status: AC
  Administered 2017-05-23: 25 mg via ORAL
  Filled 2017-05-23: qty 1

## 2017-05-23 MED ORDER — SODIUM CHLORIDE 0.9 % IV SOLN
Freq: Once | INTRAVENOUS | Status: AC
  Administered 2017-05-23: 12:00:00 via INTRAVENOUS

## 2017-05-23 NOTE — Clinical Social Work Note (Signed)
Clinical Social Work Assessment  Patient Details  Name: Cindy Schultz MRN: [id number] Date of Birth: May 30, 1924  Date of referral:  05/23/17               Reason for consult:  Facility Placement                Permission sought to share information with:    Permission granted to share information::     Name::        Agency::     Relationship::     Contact Information:     Housing/Transportation Living arrangements for the past 2 months:  Single Family Home Source of Information:  Patient Patient Interpreter Needed:  None Criminal Activity/Legal Involvement Pertinent to Current Situation/Hospitalization:  No - Comment as needed Significant Relationships:  Adult Children Lives with:  Self Do you feel safe going back to the place where you live?  Yes Need for family participation in patient care:  Yes (Comment)  Care giving concerns:  None identified at baseline.    Social Worker assessment / plan: Patient lives alone. At baseline she is independent in her ADLs, goes dancing twice a week, drives, ambulates independently (uses a cane when outside checking her mailbox). She states that before she was hospitalized she "went anywhere I wanted to go." Patient states that she is undecided about whether she wants to go to SNF or to have HHPT.  Patient states that she hopes to be well enough at discharge to go home.   LCSW will follow up with patient to assess her decision. Patient did provide permisson for LCSW to sent clinicals ot [place].   Employment status:  Retired Forensic scientist:  Medicare PT Recommendations:  [place] / Referral to community resources:  [place]  Patient/Family's Response to care:  Patient is undecided about SNF.   Patient/Family's Understanding of and Emotional Response to Diagnosis, Current Treatment, and Prognosis:  Patient understands her diagnosis, treatment and prognosis.   Emotional  Assessment Appearance:  Appears stated age Attitude/Demeanor/Rapport:    Affect (typically observed):  Apprehensive Orientation:  Oriented to Place, Oriented to  Time, Oriented to Situation, Oriented to Self Alcohol / Substance use:  Not Applicable Psych involvement (Current and /or in the community):  No (Comment)  Discharge Needs  Concerns to be addressed:  Discharge Planning Concerns Readmission within the last 30 days:  No Current discharge risk:  None Barriers to Discharge:  No Barriers Identified   Ihor Gully, LCSW 05/23/2017, 3:27 PM

## 2017-05-23 NOTE — Progress Notes (Signed)
  Ate a regular lunch tray. States bleeding tapered off..  Objective: Vital signs in last 24 hours: Temp:  [98 F (36.7 C)-98.4 F (36.9 C)] 98 F (36.7 C) (12/06 0620) Pulse Rate:  [78-80] 78 (12/06 0620) Resp:  [16] 16 (12/06 0620) BP: (119-128)/(62-70) 119/70 (12/06 0620) SpO2:  [98 %-99 %] 98 % (12/06 0620) Last BM Date: 05/23/17 General:   Alert,  Well-developed, well-nourished, pleasant and cooperative in NAD Head:  Normocephalic and atraumatic. Eyes:  Sclera clear, no icterus.  Chest: CTA bilaterally without rales, rhonchi, crackles.    Heart:  Regular rate and rhythm; no murmurs, clicks, rubs,  or gallops. Abdomen:  Soft, nontender and nondistended. No masses, hepatosplenomegaly or hernias noted. Normal bowel sounds, without guarding, and without rebound.   Extremities:  Without clubbing, deformity or edema. Neurologic:  Alert and  oriented x4;  grossly normal neurologically. Skin:  Intact without significant lesions or rashes. Psych:  Alert and cooperative. Normal mood and affect.  Intake/Output from previous day: 12/05 0701 - 12/06 0700 In: 720 [P.O.:720] Out: 300 [Urine:300] Intake/Output this shift: No intake/output data recorded.  Lab Results: CBC Recent Labs    05/21/17 1207 05/22/17 0616 05/22/17 1142 05/23/17 0516  WBC 9.7 6.7  --  8.5  HGB 9.0* 7.7* 8.5* 7.2*  HCT 28.8* 24.6* 26.6* 23.3*  MCV 106.3* 106.0*  --  105.4*  PLT 213 192  --  203   BMET Recent Labs    05/23/17 0516  NA 141  K 3.7  CL 117*  CO2 18*  GLUCOSE 101*  BUN 24*  CREATININE 1.24*  CALCIUM 8.1*   LFTs No results for input(s): BILITOT, BILIDIR, IBILI, ALKPHOS, AST, ALT, PROT, ALBUMIN in the last 72 hours. No results for input(s): LIPASE in the last 72 hours. PT/INR No results for input(s): LABPROT, INR in the last 72 hours.    Imaging Studies: No results found.[2 weeks]   Assessment:  83 year old female admitted with rectal bleeding with recent colonoscopy in  October noting 3 tubular adenomas and diverticulosis throughout the entire colon.  Similar bleeding back in 2013 felt to be diverticular in nature at the time.  Admitting hemoglobin 10.4, down to 7.7 today.  Baseline hemoglobin unknown.  MCV elevated at 106.  B12 over 1000, folate pending. Hgb today 7.2. Some dizziness yesterday. Patient agreeable to one unit of prbcs. Consumed prune juice this morning and has had two BMs, soft solid stool coated with small amount of brbpr and blood clot. Some persistent bleeding but improved. Likely diverticular vs ischemic colitis given vague left sided abd pain last two days. Discussed with Dr. [name], no need for CT imaging.     Plan: 1. Transfuse one unit of prbcs.  2. Avoid nsaids.  3. Appears bleeding is slowing. Continue to monitor. To discuss further with Dr. [name]. No clear need for further evaluation or repeat colonoscopy at this time.   Laureen Ochs. Bernarda Caffey [facility] [phone] 12/6/201810:17 AM     LOS: 3 days    Patient seen and examined. Agree with above assessment and recommendations. No need for endoscopic evaluation at this time.  Ischemic colitis would remain in the differential as a potential cause of bleeding and left-sided abdominal pain-should be self-limiting.

## 2017-05-23 NOTE — Progress Notes (Signed)
PROGRESS NOTE  Cindy Schultz:[id number] DOB: 1924/01/16 DOA: 05/19/2017 PCP: Sinda Du, MD Brief History:  83 year old female with a history of CKD stage IV, hypertension, diverticular GI bleed presented on May 19, 2017 secondary to 4 episodes of hematochezia.  The patient had a colonoscopy on April 10, 2017 which revealed 3 polyps which were removed and pandiverticulosis.  The patient's hemoglobin was 10.4 at the time of presentation.  The patient continued to have some bloody stools since admission.  The patient occasionally takes ibuprofen in the outpatient setting, and she was started on meloxicam approximately 1 month prior to admission for knee pain.  GI was consulted to help with management.  Assessment/Plan: Hematochezia/lower GI bleed -Likely diverticular bleed vs ischemic colitis -Appreciate GI follow-up -Overall volume of bleeding continues to decrease -agree with CT abd if abd pain persists/worsens  Acute blood loss anemia -Secondary to lower GI bleed -Hemoglobin dropped in part due to dilution from fluids -05/23/17--transfuse one unit PRBC  CKD stage III -Baseline creatinine 1.6-1.7 -Monitor BMP  Essential hypertension -Continue losartan, nifedipine    Disposition Plan:   Home when cleared by GI Family Communication:   Family at bedside  Consultants:  GI  Code Status:  FULL   DVT Prophylaxis:  SCDs   Procedures: As Listed in Progress Note Above  Antibiotics: None       Subjective: Patient had one bowel movement with only very small amount of blood in the past 24 hours.  She states that Schultz abdominal pain is improving.  She is tolerating Schultz diet.  She denies any fevers, chills, chest pain, shortness breath, nausea, vomiting, diarrhea.  Objective: Vitals:   05/23/17 0620 05/23/17 1200 05/23/17 1228 05/23/17 1400  BP: 119/70 (!) 159/74 (!) 154/74 140/65  Pulse: 78 81 69 74  Resp: 16 16 16 16   Temp: 98 F  (36.7 C) 98.2 F (36.8 C) 98.5 F (36.9 C) 98.4 F (36.9 C)  TempSrc: Oral Oral Oral Oral  SpO2: 98% 100% 100% 100%  Weight:      Height:        Intake/Output Summary (Last 24 hours) at 05/23/2017 1750 Last data filed at 05/23/2017 1400 Gross per 24 hour  Intake 980 ml  Output 300 ml  Net 680 ml   Weight change:  Exam:   General:  Pt is alert, follows commands appropriately, not in acute distress  HEENT: No icterus, No thrush, No neck mass, [place]/AT  Cardiovascular: RRR, S1/S2, no rubs, no gallops  Respiratory: CTA bilaterally, no wheezing, no crackles, no rhonchi  Abdomen: Soft/+BS, non tender, non distended, no guarding  Extremities: No edema, No lymphangitis, No petechiae, No rashes, no synovitis   Data Reviewed: I have personally reviewed following labs and imaging studies Basic Metabolic Panel: Recent Labs  Lab 05/19/17 0958 05/20/17 0413 05/23/17 0516  NA 142 140 141  K 3.5 3.7 3.7  CL 108 112* 117*  CO2 25 22 18*  GLUCOSE 111* 86 101*  BUN 38* 28* 24*  CREATININE 1.64* 1.33* 1.24*  CALCIUM 9.6 8.5* 8.1*   Liver Function Tests: No results for input(s): AST, ALT, ALKPHOS, BILITOT, PROT, ALBUMIN in the last 168 hours. No results for input(s): LIPASE, AMYLASE in the last 168 hours. No results for input(s): AMMONIA in the last 168 hours. Coagulation Profile: No results for input(s): INR, PROTIME in the last 168 hours. CBC: Recent Labs  Lab 05/20/17 0413 05/20/17 1539 05/21/17 1207 05/22/17  5638 05/22/17 1142 05/23/17 0516 05/23/17 1531  WBC 8.0 9.0 9.7 6.7  --  8.5  --   NEUTROABS  --   --  6.1  --   --   --   --   HGB 9.2* 9.4* 9.0* 7.7* 8.5* 7.2* 9.2*  HCT 29.3* 30.0* 28.8* 24.6* 26.6*  24.8* 23.3* 28.4*  MCV 105.8* 106.8* 106.3* 106.0*  --  105.4*  --   PLT 197 198 213 192  --  203  --    Cardiac Enzymes: No results for input(s): CKTOTAL, CKMB, CKMBINDEX, TROPONINI in the last 168 hours. BNP: Invalid input(s): POCBNP CBG: No results for  input(s): GLUCAP in the last 168 hours. HbA1C: No results for input(s): HGBA1C in the last 72 hours. Urine analysis:    Component Value Date/Time   COLORURINE YELLOW 08/05/2009 [place] 08/05/2009 1533   LABSPEC 1.020 08/05/2009 1533   PHURINE 5.0 08/05/2009 1533   GLUCOSEU NEGATIVE 08/05/2009 1533   HGBUR NEGATIVE 08/05/2009 1533   BILIRUBINUR NEGATIVE 08/05/2009 1533   KETONESUR NEGATIVE 08/05/2009 1533   PROTEINUR NEGATIVE 08/05/2009 1533   UROBILINOGEN 0.2 08/05/2009 1533   NITRITE NEGATIVE 08/05/2009 1533   LEUKOCYTESUR  08/05/2009 1533    NEGATIVE MICROSCOPIC NOT DONE ON URINES WITH NEGATIVE PROTEIN, BLOOD, LEUKOCYTES, NITRITE, OR GLUCOSE <1000 mg/dL.   Sepsis Labs: @LABRCNTIP (procalcitonin:4,lacticidven:4) )No results found for this or any previous visit (from the past 240 hour(s)).   Scheduled Meds: . dorzolamide-timolol  1 drop Both Eyes BID  . latanoprost  1 drop Both Eyes QHS  . losartan  50 mg Oral Daily  . lubiprostone  24 mcg Oral BID WC  . NIFEdipine  30 mg Oral Daily  . oxybutynin  10 mg Oral Daily   Continuous Infusions: . sodium chloride 10 mL/hr at 05/23/17 1500  . sodium chloride 75 mL/hr at 05/22/17 1212    Procedures/Studies: No results found.  Orson Eva, DO  [facility] Pager [phone]  If 7PM-7AM, please contact night-coverage [email or web address] Password [email or web address] 05/23/2017, 5:50 PM   LOS: 3 days

## 2017-05-23 NOTE — NC FL2 (Signed)
[place] LEVEL OF CARE SCREENING TOOL     IDENTIFICATION  Patient Name: Cindy Schultz Birthdate: 12-28-1923 Sex: female Admission Date (Current Location): 05/19/2017  [facility] and [place] Number:  [employer] and Address:  [place] [address], [place]      Provider Number: [id number]  Attending Physician Name and Address:  Orson Eva, MD  Relative Name and Phone Number:       Current Level of Care: Hospital Recommended Level of Care: [place] Prior Approval Number:    Date Approved/Denied:   PASRR Number: [id number]  Discharge Plan: SNF    Current Diagnoses: Patient Active Problem List   Diagnosis Date Noted  . CKD (chronic kidney disease) stage 3, GFR 30-59 ml/min (HCC) 05/22/2017  . Macrocytosis   . Diverticulosis of large intestine with hemorrhage   . Rectal bleed 05/19/2017  . CKD (chronic kidney disease) stage 4, GFR 15-29 ml/min (HCC) 05/19/2017  . LLQ pain 03/08/2017  . Colon polyp 03/08/2017  . Constipation 03/08/2017  . Acute blood loss anemia 09/10/2014  . GIB (gastrointestinal bleeding) 09/08/2014  . Occlusion and stenosis of carotid artery without mention of cerebral infarction 12/14/2013  . Osteoarthritis of left knee 04/23/2013  . Thumb pain 10/22/2012  . De Quervain's syndrome (tenosynovitis) 10/22/2012  . [facility] DJD(carpometacarpal degenerative joint disease), localized primary 10/22/2012  . CTS (carpal tunnel syndrome) 10/22/2012  . Trigger thumb of right hand 10/22/2012  . Pes anserinus bursitis of right knee 06/26/2012  . OA (osteoarthritis) of knee 06/26/2012  . Leg pain, left 06/26/2012  . Radicular pain of left lower extremity 06/26/2012  . Diverticulosis of colon with hemorrhage 09/22/2011  . Syncopal episodes 09/20/2011  . GI bleed 09/19/2011  . HTN (hypertension) 09/19/2011  . Anemia due to blood loss, acute 09/19/2011  . Abnormality of gait 01/03/2011  .  Personal history of fall 12/27/2010  . DISLOCATION CLOSED SHOULDER NEC 04/25/2010  . ANSERINE BURSITIS, LEFT 02/13/2010  . KNEE PAIN 12/22/2009  . LEG PAIN, BILATERAL 12/22/2009  . TOTAL KNEE FOLLOW-UP 12/22/2009  . HIP PAIN 02/07/2009  . LOW BACK PAIN 02/07/2009    Orientation RESPIRATION BLADDER Height & Weight     Self, Time, Situation, Place  Normal Continent Weight: 124 lb 5 oz (56.4 kg) Height:  5\' 5"  (165.1 cm)  BEHAVIORAL SYMPTOMS/MOOD NEUROLOGICAL BOWEL NUTRITION STATUS      Continent Diet(DYS 3)  AMBULATORY STATUS COMMUNICATION OF NEEDS Skin   Limited Assist   Normal                       Personal Care Assistance Level of Assistance  Bathing, Feeding, Dressing Bathing Assistance: Limited assistance Feeding assistance: Independent Dressing Assistance: Limited assistance     Functional Limitations Info  Speech, Hearing, Sight Sight Info: Adequate Hearing Info: Impaired(patient is HOH) Speech Info: Adequate    SPECIAL CARE FACTORS FREQUENCY  PT (By licensed PT)     PT Frequency: 5x/week              Contractures Contractures Info: Not present    Additional Factors Info  Code Status, Allergies Code Status Info: Full Code Allergies Info: Promethazine Hcl, Codeine, Tramadol Hcl, Hydrocodone           Current Medications (05/23/2017):  This is the current hospital active medication list Current Facility-Administered Medications  Medication Dose Route Frequency Provider Last Rate Last Dose  . 0.9 %  sodium  chloride infusion   Intravenous Once Isaac Bliss, Rayford Halsted, MD   Stopped at 05/21/17 1502  . 0.9 %  sodium chloride infusion   Intravenous Continuous Isaac Bliss, Rayford Halsted, MD 75 mL/hr at 05/22/17 1212    . acetaminophen (TYLENOL) tablet 650 mg  650 mg Oral Q6H PRN Isaac Bliss, Rayford Halsted, MD   650 mg at 05/19/17 2210   Or  . acetaminophen (TYLENOL) suppository 650 mg  650 mg Rectal Q6H PRN Isaac Bliss, Rayford Halsted, MD      .  dorzolamide-timolol (COSOPT) 22.3-6.8 MG/ML ophthalmic solution 1 drop  1 drop Both Eyes BID Isaac Bliss, Rayford Halsted, MD   1 drop at 05/23/17 [phone]  . latanoprost (XALATAN) 0.005 % ophthalmic solution 1 drop  1 drop Both Eyes QHS Isaac Bliss, Rayford Halsted, MD   1 drop at 05/22/17 2200  . losartan (COZAAR) tablet 50 mg  50 mg Oral Daily Isaac Bliss, Rayford Halsted, MD   50 mg at 05/23/17 0918  . lubiprostone (AMITIZA) capsule 24 mcg  24 mcg Oral BID WC Isaac Bliss, Rayford Halsted, MD   24 mcg at 05/21/17 0754  . NIFEdipine (PROCARDIA-XL/ADALAT-CC/NIFEDICAL-XL) 24 hr tablet 30 mg  30 mg Oral Daily Isaac Bliss, Rayford Halsted, MD   30 mg at 05/23/17 [phone]  . ondansetron (ZOFRAN) tablet 4 mg  4 mg Oral Q6H PRN Isaac Bliss, Rayford Halsted, MD       Or  . ondansetron [facility]) injection 4 mg  4 mg Intravenous Q6H PRN Isaac Bliss, Rayford Halsted, MD      . oxybutynin (DITROPAN-XL) 24 hr tablet 10 mg  10 mg Oral Daily Isaac Bliss, Rayford Halsted, MD   10 mg at 05/23/17 [phone]  . senna-docusate (Senokot-S) tablet 1 tablet  1 tablet Oral QHS PRN Isaac Bliss, Rayford Halsted, MD   1 tablet at 05/20/17 1042     Discharge Medications: Please see discharge summary for a list of discharge medications.  Relevant Imaging Results:  Relevant Lab Results:   Additional Information SSN [id number] Clydene Pugh, LCSW

## 2017-05-23 NOTE — Progress Notes (Signed)
Physical Therapy Treatment Patient Details Name: Cindy Schultz MRN: [id number] DOB: 07-30-1923 Today's Date: 05/23/2017    History of Present Illness Cindy Schultz is a 83 y.o. female who lives independently who has a past medical history of hypertension, arthritis, as well as prior history of GI bleeding who just recently had a colonoscopy 2 months ago with reports of pandiverticulosis and a couple polyps that were removed.  She comes in today after having 4 episodes of rectal bleeding that started this morning at around 7, last episode at around 9:30 AM.  She states that initially it was stool mixed with blood and subsequently just red blood.  She never felt dizzy or lightheaded or short of breath.  She called her daughter who advised her to come to the hospital for further evaluation.  Her vital signs are stable, she is not orthostatic, her hemoglobin is 10.4, creatinine is 1.64 which is her baseline.  Admission is requested.    PT Comments    Pt sitting in chair upon therapist entrance for therapy.  Pt friendly and willing to participate with therapy today.  No reports of pain.  Noted improved endurance with increased distance with gait training, min A for safety.  No reports of shortness of breath and no LOB episodes during session.  EOS pt left in chair with call bell within reach.      Follow Up Recommendations  SNF;Supervision for mobility/OOB     Equipment Recommendations  None recommended by PT    Recommendations for Other Services       Precautions / Restrictions Precautions Precautions: Fall Restrictions Weight Bearing Restrictions: No    Mobility  Bed Mobility               General bed mobility comments: pt in chair upon therapist entrance  Transfers Overall transfer level: Needs assistance Equipment used: Rolling walker (2 wheeled) Transfers: Sit to/from Stand Sit to Stand: Min guard         General transfer comment: improved stability with  standing, no LOB did use hands on RW  Ambulation/Gait Ambulation/Gait assistance: Min assist Ambulation Distance (Feet): 60 Feet Assistive device: Rolling walker (2 wheeled) Gait Pattern/deviations: Decreased step length - left;Decreased stride length     General Gait Details: demonstrated improved endurance wiht increased distance and minimal reports of shortness of breath, was limited by fatigue at EOS due to gait, no LOB through session   Stairs            Wheelchair Mobility    Modified Rankin (Stroke Patients Only)       Balance                                            Cognition Arousal/Alertness: Awake/alert Behavior During Therapy: WFL for tasks assessed/performed Overall Cognitive Status: Within Functional Limits for tasks assessed                                        Exercises      General Comments        Pertinent Vitals/Pain Pain Assessment: No/denies pain    Home Living                      Prior Function  PT Goals (current goals can now be found in the care plan section) Progress towards PT goals: Progressing toward goals    Frequency    Min 3X/week      PT Plan Current plan remains appropriate    Co-evaluation              AM-PAC PT "6 Clicks" Daily Activity  Outcome Measure  Difficulty turning over in bed (including adjusting bedclothes, sheets and blankets)?: A Little Difficulty moving from lying on back to sitting on the side of the bed? : A Little Difficulty sitting down on and standing up from a chair with arms (e.g., wheelchair, bedside commode, etc,.)?: A Little Help needed moving to and from a bed to chair (including a wheelchair)?: A Little Help needed walking in hospital room?: A Little Help needed climbing 3-5 steps with a railing? : A Lot 6 Click Score: 17    End of Session Equipment Utilized During Treatment: Gait belt Activity Tolerance: Patient  tolerated treatment well;Patient limited by fatigue Patient left: in chair;with call bell/phone within reach Nurse Communication: Mobility status PT Visit Diagnosis: Unsteadiness on feet (R26.81);Other abnormalities of gait and mobility (R26.89);Muscle weakness (generalized) (M62.81)     Time: 3267-1245 PT Time Calculation (min) (ACUTE ONLY): 22 min  Charges:  $Therapeutic Activity: 8-22 mins                    G Codes:       Ihor Austin, LPTA; CBIS [phone]    Aldona Lento 05/23/2017, 1:44 PM

## 2017-05-23 NOTE — Progress Notes (Signed)
Received one unit prbc this afternoon with no adverse reaction.  HGB rose to 9.2.    Has reported two BMs today .  The first was medium size and brown with some darker margins and some blood in toilet water. Dr. [name], Gastroenteroly, was present and able to see BM.   Has been sitting up in chair today.

## 2017-05-24 LAB — CBC
HCT: 27.2 % — ABNORMAL LOW (ref 36.0–46.0)
HEMOGLOBIN: 8.8 g/dL — AB (ref 12.0–15.0)
MCH: 33.1 pg (ref 26.0–34.0)
MCHC: 32.4 g/dL (ref 30.0–36.0)
MCV: 102.3 fL — ABNORMAL HIGH (ref 78.0–100.0)
PLATELETS: 179 10*3/uL (ref 150–400)
RBC: 2.66 MIL/uL — ABNORMAL LOW (ref 3.87–5.11)
RDW: 16.4 % — ABNORMAL HIGH (ref 11.5–15.5)
WBC: 8.7 10*3/uL (ref 4.0–10.5)

## 2017-05-24 NOTE — Progress Notes (Signed)
Removed IV-clean, dry, intact. Reviewed d/c paperwork and home meds. Wheeled stable pat to entrance where she got in the car with her grandson.

## 2017-05-24 NOTE — Care Management Note (Signed)
Case Management Note  Patient Details  Name: Cindy Schultz MRN: [id number] Date of Birth: [birth year]   Expected Discharge Date:  05/21/17               Expected Discharge Plan:  Home/Self Care  In-House Referral:  Clinical Social Work  Discharge planning Services  CM Consult  Post Acute Care Choice:  NA Choice offered to:  NA  Status of Service:  Completed, signed off  Additional Comments: [place] home today. Pt declines SNF and HH. She says she feels much better today and does not feel she needs anything. "HH would only walk her up the hall and she can do that herself." Pt wants to call [facility] herself to tell them she's not coming. She is ambulating in room with steady gait and free from assistive devices. CM has contacted pt's daughter to discuss POC and that her mother has declined HH. Daughter agreeable to plan.   Sherald Barge, RN 05/24/2017, 9:41 AM

## 2017-05-24 NOTE — Discharge Summary (Signed)
Physician Discharge Summary  Cindy Schultz OAC:[id number] DOB: 1924-05-22 DOA: 05/19/2017  PCP: Sinda Du, MD  Admit date: 05/19/2017 Discharge date: 05/24/2017  Admitted From: Home Disposition:  Home--refuses SNF  Recommendations for Outpatient Follow-up:  1. Follow up with PCP in 1-2 weeks 2. Please obtain BMP/CBC in one week   Home Health: refuses   Discharge Condition: Stable CODE STATUS:FULL Diet recommendation: Heart Healthy    Brief/Interim Summary: 83 year old female with a history of CKD stage IV, hypertension, diverticular GI bleed presented on May 19, 2017 secondary to 4 episodes of hematochezia.The patient had a colonoscopy on April 10, 2017 which revealed 3 polyps which were removed and pandiverticulosis.The patient's hemoglobin was 10.4 at the time of presentation. The patient continued to have some bloody stools since admission. The patient occasionally takes ibuprofen in the outpatient setting, and she was started on meloxicam approximately 1 month prior to admission for knee pain. GI was consulted to help with management.    Discharge Diagnoses:  Hematochezia/lower GI bleed -Likely diverticular bleed vs ischemic colitis -Appreciate GI follow-up -diet advanced and pt tolerated -abd pain continued to improve -Overall volume of bleeding continues to decrease and ultimately resolved  -discussed with GI Walden Field, NP) on 12/7--cleared pt for discharge  Acute blood loss anemia -Secondary to lower GI bleed -Hemoglobin dropped in part due to dilution from fluids -05/23/17--transfuse one unit PRBC -Hgb 8.8 on day of d/c  CKD stage III -Baseline creatinine 1.6-1.7 -Monitor BMP  Essential hypertension -Continue losartan, nifedipine     Discharge Instructions  Discharge Instructions    Diet - low sodium heart healthy   Complete by:  As directed    Increase activity slowly   Complete by:  As directed      Allergies as of  05/24/2017      Reactions   Promethazine Hcl Nausea And Vomiting, Other (See Comments)   REACTION:Confusion   Codeine Nausea And Vomiting   Tramadol Hcl Nausea And Vomiting, Other (See Comments)   Motion Sickness   Hydrocodone Other (See Comments)   Unknown Reaction       Medication List    STOP taking these medications   meloxicam 7.5 MG tablet Commonly known as:  MOBIC   Na Sulfate-K Sulfate-Mg Sulf 17.5-3.13-1.6 GM/177ML Soln Commonly known as:  SUPREP BOWEL PREP KIT     TAKE these medications   acetaminophen 500 MG tablet Commonly known as:  TYLENOL Take 500 mg by mouth every 6 (six) hours as needed (pain).   Calcium 600-200 MG-UNIT tablet Take 1 tablet by mouth daily.   CHOLEST OFF PO Take 1 tablet by mouth daily.   dorzolamide-timolol 22.3-6.8 MG/ML ophthalmic solution Commonly known as:  COSOPT Place 1 drop into both eyes 2 (two) times daily.   losartan 50 MG tablet Commonly known as:  COZAAR Take 50 mg by mouth daily.   lubiprostone 24 MCG capsule Commonly known as:  AMITIZA Take 24 mcg by mouth 2 (two) times daily with a meal. Takes prn   LUMIGAN 0.01 % Soln Generic drug:  bimatoprost Place 1 drop into both eyes at bedtime.   NIFEdipine 30 MG 24 hr tablet Commonly known as:  PROCARDIA-XL/ADALAT-CC/NIFEDICAL-XL Take 30 mg by mouth daily.   oxybutynin 10 MG 24 hr tablet Commonly known as:  DITROPAN-XL Take 10 mg by mouth daily.   polyethylene glycol packet Commonly known as:  MIRALAX / GLYCOLAX Take 17 g by mouth daily.   PRESERVISION AREDS 2 PO Take by mouth. One  tablet daily   promethazine 12.5 MG tablet Commonly known as:  PHENERGAN Take 1 tablet (12.5 mg total) by mouth every 6 (six) hours as needed for nausea or vomiting.   traMADol 50 MG tablet Commonly known as:  ULTRAM Take 1 tablet (50 mg total) by mouth every 6 (six) hours as needed.   Vitamin D3 10000 units Tabs Take 1,000 Units by mouth daily.       Allergies  Allergen  Reactions  . Promethazine Hcl Nausea And Vomiting and Other (See Comments)    REACTION:Confusion  . Codeine Nausea And Vomiting  . Tramadol Hcl Nausea And Vomiting and Other (See Comments)    Motion Sickness  . Hydrocodone Other (See Comments)    Unknown Reaction     Consultations:  GI   Procedures/Studies:  No results found.      Discharge Exam: Vitals:   05/24/17 0451 05/24/17 1435  BP: 123/69 124/68  Pulse: 80 90  Resp: 20 16  Temp: 98 F (36.7 C) 98.2 F (36.8 C)  SpO2: 99% 100%   Vitals:   05/23/17 1400 05/23/17 2152 05/24/17 0451 05/24/17 1435  BP: 140/65 (!) 160/71 123/69 124/68  Pulse: 74 96 80 90  Resp: _0 Temp: 98.4 F (36.9 C) 97.7 F (36.5 C) 98 F (36.7 C) 98.2 F (36.8 C)  TempSrc: Oral Oral Oral Oral  SpO2: 100% 100% 99% 100%  Weight:      Height:        General: Pt is alert, awake, not in acute distress Cardiovascular: RRR, S1/S2 +, no rubs, no gallops Respiratory: CTA bilaterally, no wheezing, no rhonchi Abdominal: Soft, NT, ND, bowel sounds + Extremities: no edema, no cyanosis   The results of significant diagnostics from this hospitalization (including imaging, microbiology, ancillary and laboratory) are listed below for reference.    Significant Diagnostic Studies: No results found.   Microbiology: No results found for this or any previous visit (from the past 240 hour(s)).   Labs: Basic Metabolic Panel: Recent Labs  Lab 05/19/17 0958 05/20/17 0413 05/23/17 0516  NA 142 140 141  K 3.5 3.7 3.7  CL 108 112* 117*  CO2 25 22 18*  GLUCOSE 111* 86 101*  BUN 38* 28* 24*  CREATININE 1.64* 1.33* 1.24*  CALCIUM 9.6 8.5* 8.1*   Liver Function Tests: No results for input(s): AST, ALT, ALKPHOS, BILITOT, PROT, ALBUMIN in the last 168 hours. No results for input(s): LIPASE, AMYLASE in the last 168 hours. No results for input(s): AMMONIA in the last 168 hours. CBC: Recent Labs  Lab 05/20/17 1539 05/21/17 1207  05/22/17 0616 05/22/17 1142 05/23/17 0516 05/23/17 1531 05/24/17 0405  WBC 9.0 9.7 6.7  --  8.5  --  8.7  NEUTROABS  --  6.1  --   --   --   --   --   HGB 9.4* 9.0* 7.7* 8.5* 7.2* 9.2* 8.8*  HCT 30.0* 28.8* 24.6* 26.6*  24.8* 23.3* 28.4* 27.2*  MCV 106.8* 106.3* 106.0*  --  105.4*  --  102.3*  PLT 198 213 192  --  203  --  179   Cardiac Enzymes: No results for input(s): CKTOTAL, CKMB, CKMBINDEX, TROPONINI in the last 168 hours. BNP: Invalid input(s): POCBNP CBG: No results for input(s): GLUCAP in the last 168 hours.  Time coordinating discharge:  Greater than 30 minutes  Signed:  Orson Eva, DO [facility] Pager: [phone] 05/24/2017, 3:15 PM

## 2017-05-24 NOTE — Plan of Care (Signed)
Pt is progressing 

## 2017-05-24 NOTE — Clinical Social Work Note (Signed)
Patient has made the decision to go home. Patient's daughter, [name], is agreeable for patient to return to her home. She states that patient's family will check in on her more frequently while she recovers.   LCSW signing off.      Ole Lafon, Clydene Pugh, LCSW

## 2017-05-24 NOTE — Progress Notes (Signed)
Physical Therapy Treatment Patient Details Name: Cindy Schultz MRN: [id number] DOB: 1924/02/21 Today's Date: 05/24/2017    History of Present Illness Cindy Schultz is a 83 y.o. female who lives independently who has a past medical history of hypertension, arthritis, as well as prior history of GI bleeding who just recently had a colonoscopy 2 months ago with reports of pandiverticulosis and a couple polyps that were removed.  She comes in today after having 4 episodes of rectal bleeding that started this morning at around 7, last episode at around 9:30 AM.  She states that initially it was stool mixed with blood and subsequently just red blood.  She never felt dizzy or lightheaded or short of breath.  She called her daughter who advised her to come to the hospital for further evaluation.  Her vital signs are stable, she is not orthostatic, her hemoglobin is 10.4, creatinine is 1.64 which is her baseline.  Admission is requested.    PT Comments    Patient demonstrates increased tolerance for out of bed and gait training in hallways without loss of balance.  Plan: patient discharging home today and discharged from physical therapy to care of nursing.   Follow Up Recommendations  Supervision - Intermittent;Home health PT     Equipment Recommendations  None recommended by PT    Recommendations for Other Services       Precautions / Restrictions Precautions Precautions: Fall Restrictions Weight Bearing Restrictions: No    Mobility  Bed Mobility               General bed mobility comments: Patient presents in chair  Transfers Overall transfer level: Independent                  Ambulation/Gait Ambulation/Gait assistance: Supervision Ambulation Distance (Feet): 120 Feet Assistive device: 1 person hand held assist Gait Pattern/deviations: Decreased step length - right;Decreased step length - left;Decreased stride length   Gait velocity interpretation: Below  normal speed for age/gender General Gait Details: demonstrates slow cadence without loss of balance, hand held assist   Stairs            Wheelchair Mobility    Modified Rankin (Stroke Patients Only)       Balance Overall balance assessment: Needs assistance Sitting-balance support: No upper extremity supported;Feet supported Sitting balance-Leahy Scale: Good     Standing balance support: No upper extremity supported;During functional activity Standing balance-Leahy Scale: Fair                              Cognition Arousal/Alertness: Awake/alert Behavior During Therapy: WFL for tasks assessed/performed Overall Cognitive Status: Within Functional Limits for tasks assessed                                        Exercises      General Comments        Pertinent Vitals/Pain Pain Assessment: No/denies pain    Home Living                      Prior Function            PT Goals (current goals can now be found in the care plan section) Acute Rehab PT Goals Patient Stated Goal: return home PT Goal Formulation: With patient Time For Goal Achievement: 05/24/17 Potential to  Achieve Goals: Good Progress towards PT goals: Progressing toward goals    Frequency    Min 3X/week      PT Plan Current plan remains appropriate    Co-evaluation              AM-PAC PT "6 Clicks" Daily Activity  Outcome Measure  Difficulty turning over in bed (including adjusting bedclothes, sheets and blankets)?: None Difficulty moving from lying on back to sitting on the side of the bed? : None Difficulty sitting down on and standing up from a chair with arms (e.g., wheelchair, bedside commode, etc,.)?: None Help needed moving to and from a bed to chair (including a wheelchair)?: None Help needed walking in hospital room?: A Little Help needed climbing 3-5 steps with a railing? : A Little 6 Click Score: 22    End of Session    Activity Tolerance: Patient tolerated treatment well Patient left: in chair;with call bell/phone within reach Nurse Communication: Mobility status PT Visit Diagnosis: Unsteadiness on feet (R26.81);Other abnormalities of gait and mobility (R26.89);Muscle weakness (generalized) (M62.81)     Time: 0375-4360 PT Time Calculation (min) (ACUTE ONLY): 21 min  Charges:  $Therapeutic Activity: 8-22 mins                    G Codes:       3:45 PM, 2017-05-26 Lonell Grandchild, MPT Physical Therapist with [facility] [phone] office [phone] mobile phone

## 2017-05-24 NOTE — Care Management Important Message (Signed)
Important Message  Patient Details  Name: ANAIZA BEHRENS MRN: [id number] Date of Birth: 12-04-1923   Medicare Important Message Given:  Yes    Sherald Barge, RN 05/24/2017, 9:40 AM

## 2017-05-27 LAB — BPAM RBC
BLOOD PRODUCT EXPIRATION DATE: 201812192359
BLOOD PRODUCT EXPIRATION DATE: 201812242359
ISSUE DATE / TIME: 201812061149
UNIT TYPE AND RH: 6200
Unit Type and Rh: 6200

## 2017-05-27 LAB — TYPE AND SCREEN
ABO/RH(D): A POS
Antibody Screen: NEGATIVE
UNIT DIVISION: 0
Unit division: 0

## 2017-05-28 ENCOUNTER — Ambulatory Visit: Payer: Medicare Other | Admitting: Gastroenterology

## 2017-06-26 DIAGNOSIS — K635 Polyp of colon: Secondary | ICD-10-CM | POA: Diagnosis not present

## 2017-06-26 DIAGNOSIS — I10 Essential (primary) hypertension: Secondary | ICD-10-CM | POA: Diagnosis not present

## 2017-06-26 DIAGNOSIS — S81812A Laceration without foreign body, left lower leg, initial encounter: Secondary | ICD-10-CM | POA: Diagnosis not present

## 2017-06-26 DIAGNOSIS — K5791 Diverticulosis of intestine, part unspecified, without perforation or abscess with bleeding: Secondary | ICD-10-CM | POA: Diagnosis not present

## 2017-06-28 ENCOUNTER — Other Ambulatory Visit: Payer: Self-pay | Admitting: Nurse Practitioner

## 2017-06-28 ENCOUNTER — Ambulatory Visit (INDEPENDENT_AMBULATORY_CARE_PROVIDER_SITE_OTHER): Payer: Medicare Other | Admitting: Nurse Practitioner

## 2017-06-28 ENCOUNTER — Encounter: Payer: Self-pay | Admitting: Nurse Practitioner

## 2017-06-28 VITALS — BP 160/81 | HR 67 | Temp 97.0°F | Ht 62.0 in | Wt 126.2 lb

## 2017-06-28 DIAGNOSIS — D122 Benign neoplasm of ascending colon: Secondary | ICD-10-CM

## 2017-06-28 DIAGNOSIS — K635 Polyp of colon: Secondary | ICD-10-CM

## 2017-06-28 DIAGNOSIS — K5791 Diverticulosis of intestine, part unspecified, without perforation or abscess with bleeding: Secondary | ICD-10-CM | POA: Diagnosis not present

## 2017-06-28 NOTE — Progress Notes (Signed)
Referring Provider: Sinda Du, MD Primary Care Physician:  Sinda Du, MD Primary GI:  Dr. [name]  Chief Complaint  Patient presents with  . Follow-up    doing okay    HPI:   Cindy Schultz is a 84 y.o. female who presents for hospital follow-up visit.  The patient was last seen as an outpatient in our office 03/08/2017 for left lower quadrant pain, a sending colon polyp, constipation.  Noted history of GI bleed and suspected diverticular bleed on hospitalization in 2013 and 2016.  Bleeding in 2013 was significant with syncope associated.  Colonoscopy showed pancolonic diverticulosis with blood in the left colon but no active bleeding.  Small ascending colon polyp noted but not removed.  After significant dietary changes she again had bleeding in 2016 with a CT noting minimal diverticulosis along the distal, transverse, descending, sigmoid colon.  At her last visit she was not having any further bleeding but was having constipation and left lower quadrant pain currently treating with MiraLAX.  Occasional milk of magnesia for significant symptoms and occasionally an enema as well.  Recently tried Amitiza but due to cost and dislike of the medicine she was not on it.  She was requesting a colonoscopy due to excessive worry over colon cancer.  Recommended continue current bowel regimen.  Discussed with Dr. [name] who advised to proceed with colonoscopy and her procedure was scheduled.  Colonoscopy completed 04/10/2017 which found diverticulosis in the entire examined colon, three 4-6 mm polyps in the ascending colon and cecum status post removal, otherwise normal.  Surgical pathology found the polyps to be tubular adenoma.  Recommended no further colonoscopies unless new symptoms develop due to patient age.  Unfortunately the patient ended up hospitalized from 05-2017 through 05/24/2017 with complaints of hematochezia.  Hemoglobin found to be 10.4 in the emergency room and continued with  bloody stools after admission.  Occasional NSAIDs at home.  HEENT likely diverticular bleed versus ischemic colitis.  Her diet was advanced and she continued to improve with decreasing volume of bleed and resolution of bleeding before discharge.  She did require 1 unit of PRBC during her hospitalization and her discharge hemoglobin was 8.8.  Recommended outpatient GI follow-up.  Today she states she's doing well overall. Denies abdominal pain, N/V. No further GI bleed since discharge. Deneis melena, fever, chills, unintentional weight loss. Overall she states she feels fine. Denies chest pain, dyspnea, dizziness, lightheadedness, syncope, near syncope. Energy and fatigue much improved since hospitalization. Denies any other upper or lower GI symptoms.  A total of 30 minutes was spent with the patient, at least 50% of which was spent on counseling, education, and care coordination.  Past Medical History:  Diagnosis Date  . Cancer [facility]) March 2016   Skin Cancer :  Right Cheek     SCC  . Cancer [facility]) 2013   Skin Cancer  :  Anterior Neck   SCC  . Carotid artery occlusion   . Chronic kidney disease   . Diverticulitis   . DVT (deep venous thrombosis) ([place]) 07/14/10   LEA doppler   . GI bleeding    2013/2016/2018: suspected diverticular bleeding  . Glaucoma   . Hearing loss   . HTN (hypertension)    cholesterol  . Lumbar spine pain   . Lymphedema   . Macular degeneration   . Pain in joints   . Precancerous changes of the cervix    lesions  . Rectocele   . SOB (shortness  of breath) 04/04/2010   2D echo EF=>55%    Past Surgical History:  Procedure Laterality Date  . ABDOMINAL HYSTERECTOMY    . appendix    . bladder tac    . COLONOSCOPY  09/20/2011   Dr. Carol Ada: pandiverticulosis, hemorrhoids. ascending colon polyp not removed in setting of active bleeding.  Marland Kitchen COLONOSCOPY N/A 04/10/2017   Dr. [name]: three 4-6 mm polyps in ascending colon and cecum, s/p removal. tubular adenomas.  diverticulosis in entire examined colon.   . corneal erosion     cataracts   . elevated metatarsal arch    . fallen uterus    . hemmorrhoidectomy    . left total knee replacement  05/02/04   [name]  . met osteostomy    . morton's nueroma    . OVARIAN CYST SURGERY    . RECTOCELE REPAIR    . rupture rt groin    . salk    . SKIN CANCER EXCISION Right March 2016  2013   Cheek -  [facility]  and  Anterior Neck  . tracheotomy      Current Outpatient Medications  Medication Sig Dispense Refill  . acetaminophen (TYLENOL) 500 MG tablet Take 500 mg by mouth every 6 (six) hours as needed (pain).     . Calcium 600-200 MG-UNIT per tablet Take 1 tablet by mouth daily.      . Cholecalciferol (VITAMIN D3) 10000 units TABS Take 1,000 Units by mouth daily.    . dorzolamide-timolol (COSOPT) 22.3-6.8 MG/ML ophthalmic solution Place 1 drop into both eyes 2 (two) times daily.      Marland Kitchen losartan (COZAAR) 50 MG tablet Take 50 mg by mouth daily.     Marland Kitchen LUMIGAN 0.01 % SOLN Place 1 drop into both eyes at bedtime.     . Melatonin 5 MG TABS Take by mouth at bedtime.    . meloxicam (MOBIC) 7.5 MG tablet Take 7.5 mg by mouth daily.    . Multiple Vitamins-Minerals (PRESERVISION AREDS 2 PO) Take by mouth. One tablet daily    . oxybutynin (DITROPAN-XL) 10 MG 24 hr tablet Take 10 mg by mouth daily.     . Plant Sterols and Stanols (CHOLEST OFF PO) Take 1 tablet by mouth daily.    . polyethylene glycol (MIRALAX / GLYCOLAX) packet Take 17 g by mouth daily.     No current facility-administered medications for this visit.     Allergies as of 06/28/2017 - Review Complete 06/28/2017  Allergen Reaction Noted  . Promethazine hcl Nausea And Vomiting and Other (See Comments)   . Codeine Nausea And Vomiting   . Tramadol hcl Nausea And Vomiting and Other (See Comments)   . Hydrocodone Other (See Comments)     Family History  Problem Relation Age of Onset  . Stroke Mother   . Diabetes Mother   . Heart attack Father   . COPD  Paternal Grandfather   . Dementia Sister   . Colon cancer Neg Hx     Social History   Socioeconomic History  . Marital status: Divorced    Spouse name: None  . Number of children: None  . Years of education: None  . Highest education level: None  Social Needs  . Financial resource strain: None  . Food insecurity - worry: None  . Food insecurity - inability: None  . Transportation needs - medical: None  . Transportation needs - non-medical: None  Occupational History  . None  Tobacco Use  . Smoking  status: Never Smoker  . Smokeless tobacco: Never Used  Substance and Sexual Activity  . Alcohol use: No  . Drug use: No  . Sexual activity: No  Other Topics Concern  . None  Social History Narrative  . None    Review of Systems: General: Negative for anorexia, weight loss, fever, chills, fatigue, weakness. ENT: Negative for hoarseness, difficulty swallowing. CV: Negative for chest pain, angina, palpitations, peripheral edema.  Respiratory: Negative for dyspnea at rest, cough, sputum, wheezing.  GI: See history of present illness. Endo: Negative for unusual weight change.  Heme: Negative for bruising or bleeding. Allergy: Negative for rash or hives.   Physical Exam: BP (!) 160/81   Pulse 67   Temp (!) 97 F (36.1 C) (Oral)   Ht _0  (1.575 m)   Wt 126 lb 3.2 oz (57.2 kg)   BMI 23.08 kg/m  General:   Alert and oriented. Pleasant and cooperative. Well-nourished and well-developed.  Eyes:  Without icterus, sclera clear and conjunctiva pink.  Ears:  Normal auditory acuity. Cardiovascular:  S1, S2 present without murmurs appreciated. Extremities without clubbing or edema. Respiratory:  Clear to auscultation bilaterally. No wheezes, rales, or rhonchi. No distress.  Gastrointestinal:  +BS, soft, non-tender and non-distended. No HSM noted. No guarding or rebound. No masses appreciated.  Rectal:  Deferred  Musculoskalatal:  Symmetrical without gross  deformities. Neurologic:  Alert and oriented x4;  grossly normal neurologically. Psych:  Alert and cooperative. Normal mood and affect.    06/28/2017 11:50 AM   Disclaimer: This note was dictated with voice recognition software. Similar sounding words can inadvertently be transcribed and may not be corrected upon review.

## 2017-06-28 NOTE — Patient Instructions (Signed)
1. Have your blood tests drawn when you are able to. 2. It is important that you call us if you see any new rectal bleeding at home. 3. Return for follow-up in 3 months. 4. Call us if you have any questions or concerns.

## 2017-06-28 NOTE — Assessment & Plan Note (Addendum)
The patient was recently admitted for diverticular bleed.  She has had multiple diverticular bleeds in the past.  At this admission she passed a majority of her blood prior to coming to the hospital.  Minimal, decreasing amounts of hematochezia in the hospital.  She did require 1 unit of PRBCs and her hemoglobin at discharge was 8.8.  She denies any further bleeding.  No abdominal pain, nausea, vomiting.  Generally note GI symptoms at this time.  We discussed the unpredictable nature of diverticular bleeds, no real way to prevent them.  Surgery is likely not an option at this time because of her age.  Denies any symptoms of symptomatic anemia.  At this point I will recheck labs including a CBC, iron, ferritin.  Return for follow-up in 3 months.  I emphasized, at multiple points, her need to call us for any noted recurrent bleeding at home.

## 2017-06-28 NOTE — Assessment & Plan Note (Addendum)
History of colon polyps which were previously removed 04/10/2017.  No recommended repeat colonoscopy given her age, unless new symptoms develop.  She would be unlikely to be a candidate for surgical resection, chemotherapy, radiation if she did develop cancer at some point.  Due to how long ago the polyp was removed it is unlikely responsible for any of her GI bleed.

## 2017-06-29 LAB — CBC/DIFF AMBIGUOUS DEFAULT
BASOS ABS: 0 10*3/uL (ref 0.0–0.2)
Basos: 0 %
EOS (ABSOLUTE): 0.1 10*3/uL (ref 0.0–0.4)
EOS: 2 %
HEMATOCRIT: 35.9 % (ref 34.0–46.6)
HEMOGLOBIN: 11.3 g/dL (ref 11.1–15.9)
IMMATURE GRANS (ABS): 0 10*3/uL (ref 0.0–0.1)
Immature Granulocytes: 0 %
LYMPHS: 35 %
Lymphocytes Absolute: 2.6 10*3/uL (ref 0.7–3.1)
MCH: 32.5 pg (ref 26.6–33.0)
MCHC: 31.5 g/dL (ref 31.5–35.7)
MCV: 103 fL — ABNORMAL HIGH (ref 79–97)
MONOCYTES: 12 %
Monocytes Absolute: 0.9 10*3/uL (ref 0.1–0.9)
NEUTROS ABS: 3.9 10*3/uL (ref 1.4–7.0)
Neutrophils: 51 %
Platelets: 263 10*3/uL (ref 150–379)
RBC: 3.48 x10E6/uL — AB (ref 3.77–5.28)
RDW: 14.4 % (ref 12.3–15.4)
WBC: 7.5 10*3/uL (ref 3.4–10.8)

## 2017-06-29 LAB — FERRITIN: FERRITIN: 48 ng/mL (ref 15–150)

## 2017-06-29 LAB — IRON: IRON: 130 ug/dL (ref 27–139)

## 2017-07-01 NOTE — Progress Notes (Signed)
CC'D TO PCP °

## 2017-07-08 DIAGNOSIS — M545 Low back pain: Secondary | ICD-10-CM | POA: Diagnosis not present

## 2017-07-08 DIAGNOSIS — I10 Essential (primary) hypertension: Secondary | ICD-10-CM | POA: Diagnosis not present

## 2017-07-08 DIAGNOSIS — S81812A Laceration without foreign body, left lower leg, initial encounter: Secondary | ICD-10-CM | POA: Diagnosis not present

## 2017-07-10 ENCOUNTER — Other Ambulatory Visit: Payer: Self-pay

## 2017-07-10 ENCOUNTER — Telehealth (HOSPITAL_COMMUNITY): Payer: Self-pay | Admitting: Pulmonary Disease

## 2017-07-10 ENCOUNTER — Ambulatory Visit (HOSPITAL_COMMUNITY): Payer: Medicare Other | Attending: Pulmonary Disease | Admitting: Physical Therapy

## 2017-07-10 ENCOUNTER — Encounter (HOSPITAL_COMMUNITY): Payer: Self-pay | Admitting: Physical Therapy

## 2017-07-10 DIAGNOSIS — S81802D Unspecified open wound, left lower leg, subsequent encounter: Secondary | ICD-10-CM | POA: Diagnosis not present

## 2017-07-10 DIAGNOSIS — M79662 Pain in left lower leg: Secondary | ICD-10-CM | POA: Diagnosis not present

## 2017-07-10 DIAGNOSIS — X58XXXD Exposure to other specified factors, subsequent encounter: Secondary | ICD-10-CM | POA: Insufficient documentation

## 2017-07-10 NOTE — Telephone Encounter (Signed)
07/10/17  per Jenny Reichmann - this is too close to eval date and wants Friday or Monday to schedule

## 2017-07-10 NOTE — Therapy (Addendum)
Cindy Schultz, [place], [address] Phone: [phone]   Fax:  [phone]  Wound Care Evaluation  Patient Details  Name: Cindy Schultz MRN: [id number] Date of Birth: 01-29-1924 Referring Provider: Sinda Schultz   Encounter Date: 07/10/2017  PT End of Session - 07/10/17 1358    Visit Number  1    Number of Visits  12    Date for PT Re-Evaluation  08/09/17    Authorization Type  medicare    Authorization - Visit Number  1    Authorization - Number of Visits  12    PT Start Time  1300    PT Stop Time  1340    PT Time Calculation (min)  40 min    Activity Tolerance  Patient limited by pain    Behavior During Therapy  [facility] for tasks assessed/performed       Past Medical History:  Diagnosis Date  . Cancer [facility]) March 2016   Skin Cancer :  Right Cheek     SCC  . Cancer [facility]) 2013   Skin Cancer  :  Anterior Neck   SCC  . Carotid artery occlusion   . Chronic kidney disease   . Diverticulitis   . DVT (deep venous thrombosis) ([place]) 07/14/10   LEA doppler   . GI bleeding    2013/2016/2018: suspected diverticular bleeding  . Glaucoma   . Hearing loss   . HTN (hypertension)    cholesterol  . Lumbar spine pain   . Lymphedema   . Macular degeneration   . Pain in joints   . Precancerous changes of the cervix    lesions  . Rectocele   . SOB (shortness of breath) 04/04/2010   2D echo EF=>55%    Past Surgical History:  Procedure Laterality Date  . ABDOMINAL HYSTERECTOMY    . appendix    . bladder tac    . COLONOSCOPY  09/20/2011   Dr. Carol Ada: pandiverticulosis, hemorrhoids. ascending colon polyp not removed in setting of active bleeding.  Marland Kitchen COLONOSCOPY N/A 04/10/2017   Dr. [name]: three 4-6 mm polyps in ascending colon and cecum, s/p removal. tubular adenomas. diverticulosis in entire examined colon.   . corneal erosion     cataracts   . elevated metatarsal arch    . fallen uterus    .  hemmorrhoidectomy    . left total knee replacement  05/02/04   [name]  . met osteostomy    . morton's nueroma    . OVARIAN CYST SURGERY    . RECTOCELE REPAIR    . rupture rt groin    . salk    . SKIN CANCER EXCISION Right March 2016  2013   Cheek -  [facility]  and  Anterior Neck  . tracheotomy      There were no vitals filed for this visit.    [facility] PT Assessment - 07/10/17 0001      Assessment   Medical Diagnosis  Lt LE laceration     Referring Provider  Cindy Schultz    Onset Date/Surgical Date  07/03/17    Next MD Visit  unknown    Prior Therapy  none      Precautions   Precautions  None      Restrictions   Weight Bearing Restrictions  No      Balance Screen   Has the patient fallen in the past 6 months  No    Has  the patient had a decrease in activity level because of a fear of falling?   No    Is the patient reluctant to leave their home because of a fear of falling?   No      Home Environment   Living Environment  Private residence      Prior Function   Level of Independence  Independent with community mobility with device      Cognition   Overall Cognitive Status  Within Functional Limits for tasks assessed      Wound Therapy - 07/10/17 1345    Subjective  Cindy Schultz states that approximately a week ago she was getting into her car.  When she went to close the door it came in faster than she expected and hit her on her left leg causing a wound.      Patient and Family Stated Goals  Wound to heal     Date of Onset  07/03/17    Prior Treatments  silvadene; self dressing     Pain Assessment  0-10    Pain Score  8     Pain Type  Acute pain    Pain Location  Leg    Pain Orientation  Left;Lower    Pain Descriptors / Indicators  Aching;Throbbing    Pain Onset  With Activity    Patients Stated Pain Goal  0    Pain Intervention(s)  Emotional support    Multiple Pain Sites  No    Evaluation and Treatment Procedures Explained to Patient/Family  Yes     Evaluation and Treatment Procedures  agreed to    Wound Properties Date First Assessed: 07/10/17 Time First Assessed: 4827 Wound Type: Laceration Location: Leg Location Orientation: Left;Lower Wound Description (Comments): on shin with black eschar  Present on Admission: Yes   Dressing Type  Other (Comment) bandaid    Dressing Changed  Changed    Dressing Status  Old drainage    Dressing Change Frequency  PRN    Site / Wound Assessment  Black;Brown;Dusky    % Wound base Red or Granulating  0%    % Wound base Yellow/Fibrinous Exudate  25%    % Wound base Black/Eschar  75%    Peri-wound Assessment  Erythema (blanchable) distal 10cm; 2 cm medical and lateral ; 1 cm superior     Wound Length (cm)  4.5 cm    Wound Width (cm)  2 cm    Wound Surface Area (cm^2)  9 cm^2    Closure  -- Distal to wound there is a tender bump approximately 3 cm di    Drainage Amount  Scant    Drainage Description  Serosanguineous    Treatment  Cleansed;Debridement (Selective)    Selective Debridement - Location  entire wound bed    Selective Debridement - Tools Used  Forceps    Selective Debridement - Tissue Removed  devitalized tissue.     Wound Therapy - Clinical Statement  Ms. Kvamme has a nonhealing wound from a car door laceration one week ago.  She is 39 and lives alone therefore her MD referred her to skilled PT to create a healing environment.  At this time the wound is covered with black eschar and has significant redness surrounding the wound.  Ms. Welke will benefit from skilled physical therapy for debridement and dressing change to promote a healing environment.     Wound Therapy - Functional Problem List  difficulty walking due to pain.  Factors Delaying/Impairing Wound Healing  Altered sensation;Multiple medical problems;Polypharmacy;Vascular compromise    Hydrotherapy Plan  Debridement;Dressing change;Patient/family education    Wound Therapy - Frequency  2X / week    Wound Therapy - Current  Recommendations  PT    Wound Plan  Pt to be seen for debridement and dressing change twice a week for 4 weeks.      Dressing   medihoney, 4x4 and compression bandaging with profore lite.              Objective measurements completed on examination: See above findings.            PT Education - 07/10/17 1357    Education provided  Yes    Education Details  Keep dressing dry;   If dressing becomes to painful she may take one layer off at a time.     Person(s) Educated  Patient    Methods  Explanation    Comprehension  Verbalized understanding       PT Short Term Goals - 07/10/17 1406      PT SHORT TERM GOAL #1   Title  Pt LT LE  wound to be 80% granulated to reduce risk of infection     Time  3    Period  Weeks    Status  New    Target Date  07/24/17      PT SHORT TERM GOAL #2   Title  Pt pain in her LT leg to be no greater than a 5/10 to allow pt to walk without significant discomfort.     Time  3    Period  Weeks    Status  New      PT SHORT TERM GOAL #3   Title  No redness around wound to demonstrate decreased irritation/infection     Time  3    Period  Weeks    Status  New        PT Long Term Goals - 07/10/17 1408      PT LONG TERM GOAL #1   Title  PT wound on her Lt leg  to be 100% granulated to decrease risk of infection     Time  6    Period  Weeks    Status  New    Target Date  08/28/17      PT LONG TERM GOAL #2   Title  Pt to no longer have any pain  in her Lt LE to allow pt to be able to go back to dancing on Thursday nights.     Time  6    Period  Weeks    Status  New      PT LONG TERM GOAL #3   Title  Pt Lt leg wound to be no greater than 1.5x .5 cm to allow pt to feel comfortable treating at home.     Time  6    Period  Weeks    Status  New           Plan - 07/10/17 1359    Clinical Impression Statement  as above    Clinical Presentation  Stable    Clinical Decision Making  Low    Rehab Potential  Good    PT Frequency   2x / week    PT Duration  6 weeks    PT Treatment/Interventions  Other (comment);Compression bandaging debridement    PT Next Visit Plan  continue with debridement as pt will  tolerate and dressing changes.  Assess comfort of Profore lite.        Patient will benefit from skilled therapeutic intervention in order to improve the following deficits and impairments:  Pain, Decreased skin integrity  Visit Diagnosis: Pain in left lower leg  Open wound of left lower leg, subsequent encounter    Problem List Patient Active Problem List   Diagnosis Date Noted  . CKD (chronic kidney disease) stage 3, GFR 30-59 ml/min (HCC) 05/22/2017  . Macrocytosis   . Diverticulosis of large intestine with hemorrhage   . Rectal bleed 05/19/2017  . CKD (chronic kidney disease) stage 4, GFR 15-29 ml/min (HCC) 05/19/2017  . LLQ pain 03/08/2017  . Colon polyp 03/08/2017  . Constipation 03/08/2017  . Acute blood loss anemia 09/10/2014  . GIB (gastrointestinal bleeding) 09/08/2014  . Occlusion and stenosis of carotid artery without mention of cerebral infarction 12/14/2013  . Osteoarthritis of left knee 04/23/2013  . Thumb pain 10/22/2012  . De Quervain's syndrome (tenosynovitis) 10/22/2012  . [facility] DJD(carpometacarpal degenerative joint disease), localized primary 10/22/2012  . CTS (carpal tunnel syndrome) 10/22/2012  . Trigger thumb of right hand 10/22/2012  . Pes anserinus bursitis of right knee 06/26/2012  . OA (osteoarthritis) of knee 06/26/2012  . Leg pain, left 06/26/2012  . Radicular pain of left lower extremity 06/26/2012  . Diverticulosis of colon with hemorrhage 09/22/2011  . Syncopal episodes 09/20/2011  . GI bleed 09/19/2011  . HTN (hypertension) 09/19/2011  . Anemia due to blood loss, acute 09/19/2011  . Abnormality of gait 01/03/2011  . Personal history of fall 12/27/2010  . DISLOCATION CLOSED SHOULDER NEC 04/25/2010  . ANSERINE BURSITIS, LEFT 02/13/2010  . KNEE PAIN 12/22/2009  . LEG  PAIN, BILATERAL 12/22/2009  . TOTAL KNEE FOLLOW-UP 12/22/2009  . HIP PAIN 02/07/2009  . LOW BACK PAIN 02/07/2009    Rayetta Humphrey, PT CLT [phone] 07/10/2017, 2:11 PM  [facility] [address] [place], [place], [address] Phone: [phone]   Fax:  [phone]  Name: MERELIN HUMAN MRN: [id number] Date of Birth: June 29, 1923

## 2017-07-10 NOTE — Addendum Note (Signed)
Addended by: Leeroy Cha on: 07/10/2017 02:13 PM   Modules accepted: Orders

## 2017-07-11 ENCOUNTER — Ambulatory Visit (HOSPITAL_COMMUNITY): Payer: Medicare Other | Admitting: Physical Therapy

## 2017-07-11 DIAGNOSIS — N183 Chronic kidney disease, stage 3 (moderate): Secondary | ICD-10-CM | POA: Diagnosis not present

## 2017-07-11 DIAGNOSIS — D509 Iron deficiency anemia, unspecified: Secondary | ICD-10-CM | POA: Diagnosis not present

## 2017-07-11 DIAGNOSIS — I10 Essential (primary) hypertension: Secondary | ICD-10-CM | POA: Diagnosis not present

## 2017-07-11 DIAGNOSIS — E559 Vitamin D deficiency, unspecified: Secondary | ICD-10-CM | POA: Diagnosis not present

## 2017-07-11 DIAGNOSIS — Z79899 Other long term (current) drug therapy: Secondary | ICD-10-CM | POA: Diagnosis not present

## 2017-07-11 DIAGNOSIS — R809 Proteinuria, unspecified: Secondary | ICD-10-CM | POA: Diagnosis not present

## 2017-07-13 DIAGNOSIS — S81802A Unspecified open wound, left lower leg, initial encounter: Secondary | ICD-10-CM | POA: Diagnosis not present

## 2017-07-13 DIAGNOSIS — L03116 Cellulitis of left lower limb: Secondary | ICD-10-CM | POA: Diagnosis not present

## 2017-07-15 ENCOUNTER — Ambulatory Visit (HOSPITAL_COMMUNITY): Payer: Medicare Other | Admitting: Physical Therapy

## 2017-07-15 DIAGNOSIS — M79662 Pain in left lower leg: Secondary | ICD-10-CM | POA: Diagnosis not present

## 2017-07-15 DIAGNOSIS — S81802D Unspecified open wound, left lower leg, subsequent encounter: Secondary | ICD-10-CM

## 2017-07-15 NOTE — Therapy (Signed)
[place], [place], [address] Phone: [phone]   Fax:  [phone]  Wound Care Therapy  Patient Details  Name: Cindy Schultz MRN: [id number] Date of Birth: 08/28/1923 Referring Provider: Sinda Du   Encounter Date: 07/15/2017  PT End of Session - 07/15/17 1315    Visit Number  2    Number of Visits  12    Date for PT Re-Evaluation  08/09/17    Authorization Type  medicare    Authorization - Visit Number  2    Authorization - Number of Visits  12    PT Start Time  1110    PT Stop Time  1135    PT Time Calculation (min)  25 min    Activity Tolerance  Patient limited by pain    Behavior During Therapy  [facility] for tasks assessed/performed       Past Medical History:  Diagnosis Date  . Cancer [facility]) March 2016   Skin Cancer :  Right Cheek     SCC  . Cancer [facility]) 2013   Skin Cancer  :  Anterior Neck   SCC  . Carotid artery occlusion   . Chronic kidney disease   . Diverticulitis   . DVT (deep venous thrombosis) ([place]) 07/14/10   LEA doppler   . GI bleeding    2013/2016/2018: suspected diverticular bleeding  . Glaucoma   . Hearing loss   . HTN (hypertension)    cholesterol  . Lumbar spine pain   . Lymphedema   . Macular degeneration   . Pain in joints   . Precancerous changes of the cervix    lesions  . Rectocele   . SOB (shortness of breath) 04/04/2010   2D echo EF=>55%    Past Surgical History:  Procedure Laterality Date  . ABDOMINAL HYSTERECTOMY    . appendix    . bladder tac    . COLONOSCOPY  09/20/2011   Dr. Carol Ada: pandiverticulosis, hemorrhoids. ascending colon polyp not removed in setting of active bleeding.  Marland Kitchen COLONOSCOPY N/A 04/10/2017   Dr. [name]: three 4-6 mm polyps in ascending colon and cecum, s/p removal. tubular adenomas. diverticulosis in entire examined colon.   . corneal erosion     cataracts   . elevated metatarsal arch    . fallen uterus    . hemmorrhoidectomy     . left total knee replacement  05/02/04   [name]  . met osteostomy    . morton's nueroma    . OVARIAN CYST SURGERY    . RECTOCELE REPAIR    . rupture rt groin    . salk    . SKIN CANCER EXCISION Right March 2016  2013   Cheek -  [facility]  and  Anterior Neck  . tracheotomy      There were no vitals filed for this visit.              Wound Therapy - 07/15/17 1311    Subjective  Pt states she hurt so bad Saturday that she had her daughter take her to urgent care.  They put her on antibiotics.  comes today with telfa bandage over wound and reports of stinging.     Patient and Family Stated Goals  Wound to heal     Date of Onset  07/03/17    Prior Treatments  silvadene; self dressing     Pain Assessment  No/denies pain    Pain Score  6     Pain Type  Acute pain    Pain Location  Leg    Pain Orientation  Left;Lower    Pain Descriptors / Indicators  Aching;Throbbing    Pain Onset  With Activity    Patients Stated Pain Goal  0    Pain Intervention(s)  Emotional support    Multiple Pain Sites  No    Evaluation and Treatment Procedures Explained to Patient/Family  Yes    Evaluation and Treatment Procedures  agreed to    Wound Properties Date First Assessed: 07/10/17 Time First Assessed: 1448 Wound Type: Laceration Location: Leg Location Orientation: Left;Lower Wound Description (Comments): on shin with black eschar  Present on Admission: Yes   Dressing Type  Other (Comment) bandaid    Dressing Changed  Changed    Dressing Status  Old drainage    Dressing Change Frequency  PRN    Site / Wound Assessment  Black;Brown;Dusky    % Wound base Red or Granulating  10%    % Wound base Yellow/Fibrinous Exudate  70%    % Wound base Black/Eschar  20%    Peri-wound Assessment  Erythema (blanchable) distal 10cm; 2 cm medical and lateral ; 1 cm superior     Closure  -- Distal to wound there is a tender bump approximately 3 cm di    Drainage Amount  Scant    Drainage Description   Serosanguineous    Treatment  Cleansed;Debridement (Selective)    Selective Debridement - Location  entire wound bed    Selective Debridement - Tools Used  Forceps    Selective Debridement - Tissue Removed  devitalized tissue.     Wound Therapy - Clinical Statement  Overall improvement noted in wound today with less black necrotic tissue, decreased redness perimeter.  No signs/symptoms of infection.  Cleansed well and debrided additional black eschar.  Dressed wtih medihoney gel with 2X2/medipore.  covered with kerlix and #5 netting.  No compression wrap used this session.  Pt reproted overall comfort.      Wound Therapy - Functional Problem List  difficulty walking due to pain.    Factors Delaying/Impairing Wound Healing  Altered sensation;Multiple medical problems;Polypharmacy;Vascular compromise    Hydrotherapy Plan  Debridement;Dressing change;Patient/family education    Wound Therapy - Frequency  2X / week    Wound Therapy - Current Recommendations  PT    Wound Plan  Pt to be seen for debridement and dressing change twice a week for 4 weeks.      Dressing   medihoney, 4x4 and compression bandaging with profore lite.                 PT Short Term Goals - 07/10/17 1406      PT SHORT TERM GOAL #1   Title  Pt LT LE  wound to be 80% granulated to reduce risk of infection     Time  3    Period  Weeks    Status  New    Target Date  07/24/17      PT SHORT TERM GOAL #2   Title  Pt pain in her LT leg to be no greater than a 5/10 to allow pt to walk without significant discomfort.     Time  3    Period  Weeks    Status  New      PT SHORT TERM GOAL #3   Title  No redness around wound to demonstrate decreased irritation/infection  Time  3    Period  Weeks    Status  New        PT Long Term Goals - 07/10/17 1408      PT LONG TERM GOAL #1   Title  PT wound on her Lt leg  to be 100% granulated to decrease risk of infection     Time  6    Period  Weeks    Status  New     Target Date  08/28/17      PT LONG TERM GOAL #2   Title  Pt to no longer have any pain  in her Lt LE to allow pt to be able to go back to dancing on Thursday nights.     Time  6    Period  Weeks    Status  New      PT LONG TERM GOAL #3   Title  Pt Lt leg wound to be no greater than 1.5x .5 cm to allow pt to feel comfortable treating at home.     Time  6    Period  Weeks    Status  New              Patient will benefit from skilled therapeutic intervention in order to improve the following deficits and impairments:     Visit Diagnosis: Pain in left lower leg  Open wound of left lower leg, subsequent encounter     Problem List Patient Active Problem List   Diagnosis Date Noted  . CKD (chronic kidney disease) stage 3, GFR 30-59 ml/min (HCC) 05/22/2017  . Macrocytosis   . Diverticulosis of large intestine with hemorrhage   . Rectal bleed 05/19/2017  . CKD (chronic kidney disease) stage 4, GFR 15-29 ml/min (HCC) 05/19/2017  . LLQ pain 03/08/2017  . Colon polyp 03/08/2017  . Constipation 03/08/2017  . Acute blood loss anemia 09/10/2014  . GIB (gastrointestinal bleeding) 09/08/2014  . Occlusion and stenosis of carotid artery without mention of cerebral infarction 12/14/2013  . Osteoarthritis of left knee 04/23/2013  . Thumb pain 10/22/2012  . De Quervain's syndrome (tenosynovitis) 10/22/2012  . [facility] DJD(carpometacarpal degenerative joint disease), localized primary 10/22/2012  . CTS (carpal tunnel syndrome) 10/22/2012  . Trigger thumb of right hand 10/22/2012  . Pes anserinus bursitis of right knee 06/26/2012  . OA (osteoarthritis) of knee 06/26/2012  . Leg pain, left 06/26/2012  . Radicular pain of left lower extremity 06/26/2012  . Diverticulosis of colon with hemorrhage 09/22/2011  . Syncopal episodes 09/20/2011  . GI bleed 09/19/2011  . HTN (hypertension) 09/19/2011  . Anemia due to blood loss, acute 09/19/2011  . Abnormality of gait 01/03/2011  . Personal  history of fall 12/27/2010  . DISLOCATION CLOSED SHOULDER NEC 04/25/2010  . ANSERINE BURSITIS, LEFT 02/13/2010  . KNEE PAIN 12/22/2009  . LEG PAIN, BILATERAL 12/22/2009  . TOTAL KNEE FOLLOW-UP 12/22/2009  . HIP PAIN 02/07/2009  . LOW BACK PAIN 02/07/2009   Teena Irani, PTA/CLT [phone]  Teena Irani 07/15/2017, 1:16 PM  [facility] [address] [place], [place], [address] Phone: [phone]   Fax:  [phone]  Name: KAILANA BENNINGER MRN: [id number] Date of Birth: 1924-03-28

## 2017-07-17 ENCOUNTER — Ambulatory Visit (HOSPITAL_COMMUNITY): Payer: Medicare Other | Admitting: Physical Therapy

## 2017-07-17 DIAGNOSIS — S81802D Unspecified open wound, left lower leg, subsequent encounter: Secondary | ICD-10-CM

## 2017-07-17 DIAGNOSIS — I10 Essential (primary) hypertension: Secondary | ICD-10-CM | POA: Diagnosis not present

## 2017-07-17 DIAGNOSIS — M79662 Pain in left lower leg: Secondary | ICD-10-CM

## 2017-07-17 DIAGNOSIS — R809 Proteinuria, unspecified: Secondary | ICD-10-CM | POA: Diagnosis not present

## 2017-07-17 DIAGNOSIS — N250 Renal osteodystrophy: Secondary | ICD-10-CM | POA: Diagnosis not present

## 2017-07-17 DIAGNOSIS — N184 Chronic kidney disease, stage 4 (severe): Secondary | ICD-10-CM | POA: Diagnosis not present

## 2017-07-17 NOTE — Therapy (Addendum)
[place], [place], [address] Phone: [phone]   Fax:  [phone]  Wound Care Therapy  Patient Details  Name: Cindy Schultz MRN: [id number] Date of Birth: 02/16/1924 Referring Provider: Sinda Du   Encounter Date: 07/17/2017  PT End of Session - 07/17/17 1343    Visit Number  3    Number of Visits  12    Date for PT Re-Evaluation  08/09/17    Authorization Type  medicare    Authorization - Visit Number  3    Authorization - Number of Visits  12    PT Start Time  1300    PT Stop Time  1330    PT Time Calculation (min)  30 min    Activity Tolerance  Patient limited by pain    Behavior During Therapy  [facility] for tasks assessed/performed       Past Medical History:  Diagnosis Date  . Cancer [facility]) March 2016   Skin Cancer :  Right Cheek     SCC  . Cancer [facility]) 2013   Skin Cancer  :  Anterior Neck   SCC  . Carotid artery occlusion   . Chronic kidney disease   . Diverticulitis   . DVT (deep venous thrombosis) ([place]) 07/14/10   LEA doppler   . GI bleeding    2013/2016/2018: suspected diverticular bleeding  . Glaucoma   . Hearing loss   . HTN (hypertension)    cholesterol  . Lumbar spine pain   . Lymphedema   . Macular degeneration   . Pain in joints   . Precancerous changes of the cervix    lesions  . Rectocele   . SOB (shortness of breath) 04/04/2010   2D echo EF=>55%    Past Surgical History:  Procedure Laterality Date  . ABDOMINAL HYSTERECTOMY    . appendix    . bladder tac    . COLONOSCOPY  09/20/2011   Dr. Carol Ada: pandiverticulosis, hemorrhoids. ascending colon polyp not removed in setting of active bleeding.  Marland Kitchen COLONOSCOPY N/A 04/10/2017   Dr. [name]: three 4-6 mm polyps in ascending colon and cecum, s/p removal. tubular adenomas. diverticulosis in entire examined colon.   . corneal erosion     cataracts   . elevated metatarsal arch    . fallen uterus    . hemmorrhoidectomy     . left total knee replacement  05/02/04   [name]  . met osteostomy    . morton's nueroma    . OVARIAN CYST SURGERY    . RECTOCELE REPAIR    . rupture rt groin    . salk    . SKIN CANCER EXCISION Right March 2016  2013   Cheek -  [facility]  and  Anterior Neck  . tracheotomy      There were no vitals filed for this visit.              Wound Therapy - 07/17/17 1333    Subjective  Pt states her leg is feeling better than last visit but still hurting.    Patient and Family Stated Goals  Wound to heal     Date of Onset  07/03/17    Prior Treatments  silvadene; self dressing     Pain Assessment  0-10    Pain Score  4     Pain Type  Acute pain    Pain Location  Leg    Pain Orientation  Left;Lower    Pain Descriptors / Indicators  Stabbing    Pain Onset  With Activity    Patients Stated Pain Goal  0    Pain Intervention(s)  Emotional support    Multiple Pain Sites  No    Evaluation and Treatment Procedures Explained to Patient/Family  Yes    Evaluation and Treatment Procedures  agreed to    Wound Properties Date First Assessed: 07/10/17 Time First Assessed: 2500 Wound Type: Laceration Location: Leg Location Orientation: Left;Lower Wound Description (Comments): on shin with black eschar  Present on Admission: Yes   Dressing Type  Other (Comment)    Dressing Changed  Changed    Dressing Status  Old drainage    Dressing Change Frequency  PRN    Site / Wound Assessment  Black;Brown;Dusky    % Wound base Red or Granulating  10%    % Wound base Yellow/Fibrinous Exudate  75%    % Wound base Black/Eschar  15%    Peri-wound Assessment  Erythema (blanchable)    Wound Length (cm)  4.5 cm    Wound Width (cm)  2 cm    Wound Depth (cm)  0.1 cm    Wound Volume (cm^3)  0.9 cm^3    Wound Surface Area (cm^2)  9 cm^2    Drainage Amount  Minimal    Drainage Description  Serosanguineous    Treatment  Cleansed;Debridement (Selective)    Selective Debridement - Location  entire wound  bed    Selective Debridement - Tools Used  Forceps;Scalpel    Selective Debridement - Tissue Removed  devitalized tissue.     Wound Therapy - Clinical Statement  PT with poor tolerance for debridment today, however eschar is loosening and drainging more than previously.  Less redness periemter and no swelling present today.  Continued with medihoney dressing with medipore tape and netting.      Wound Therapy - Functional Problem List  difficulty walking due to pain.    Factors Delaying/Impairing Wound Healing  Altered sensation;Multiple medical problems;Polypharmacy;Vascular compromise    Hydrotherapy Plan  Debridement;Dressing change;Patient/family education    Wound Therapy - Frequency  2X / week    Wound Therapy - Current Recommendations  PT    Wound Plan  Pt to be seen for debridement and dressing change twice a week for 4 weeks.      Dressing   medihoney, 4x4 and compression bandaging with profore lite.                 PT Short Term Goals - 07/10/17 1406      PT SHORT TERM GOAL #1   Title  Pt LT LE  wound to be 80% granulated to reduce risk of infection     Time  3    Period  Weeks    Status  New    Target Date  07/24/17      PT SHORT TERM GOAL #2   Title  Pt pain in her LT leg to be no greater than a 5/10 to allow pt to walk without significant discomfort.     Time  3    Period  Weeks    Status  New      PT SHORT TERM GOAL #3   Title  No redness around wound to demonstrate decreased irritation/infection     Time  3    Period  Weeks    Status  New        PT Long Term  Goals - 07/10/17 1408      PT LONG TERM GOAL #1   Title  PT wound on her Lt leg  to be 100% granulated to decrease risk of infection     Time  6    Period  Weeks    Status  New    Target Date  08/28/17      PT LONG TERM GOAL #2   Title  Pt to no longer have any pain  in her Lt LE to allow pt to be able to go back to dancing on Thursday nights.     Time  6    Period  Weeks    Status  New       PT LONG TERM GOAL #3   Title  Pt Lt leg wound to be no greater than 1.5x .5 cm to allow pt to feel comfortable treating at home.     Time  6    Period  Weeks    Status  New              Patient will benefit from skilled therapeutic intervention in order to improve the following deficits and impairments:     Visit Diagnosis: Pain in left lower leg  Open wound of left lower leg, subsequent encounter     Problem List Patient Active Problem List   Diagnosis Date Noted  . CKD (chronic kidney disease) stage 3, GFR 30-59 ml/min (HCC) 05/22/2017  . Macrocytosis   . Diverticulosis of large intestine with hemorrhage   . Rectal bleed 05/19/2017  . CKD (chronic kidney disease) stage 4, GFR 15-29 ml/min (HCC) 05/19/2017  . LLQ pain 03/08/2017  . Colon polyp 03/08/2017  . Constipation 03/08/2017  . Acute blood loss anemia 09/10/2014  . GIB (gastrointestinal bleeding) 09/08/2014  . Occlusion and stenosis of carotid artery without mention of cerebral infarction 12/14/2013  . Osteoarthritis of left knee 04/23/2013  . Thumb pain 10/22/2012  . De Quervain's syndrome (tenosynovitis) 10/22/2012  . [facility] DJD(carpometacarpal degenerative joint disease), localized primary 10/22/2012  . CTS (carpal tunnel syndrome) 10/22/2012  . Trigger thumb of right hand 10/22/2012  . Pes anserinus bursitis of right knee 06/26/2012  . OA (osteoarthritis) of knee 06/26/2012  . Leg pain, left 06/26/2012  . Radicular pain of left lower extremity 06/26/2012  . Diverticulosis of colon with hemorrhage 09/22/2011  . Syncopal episodes 09/20/2011  . GI bleed 09/19/2011  . HTN (hypertension) 09/19/2011  . Anemia due to blood loss, acute 09/19/2011  . Abnormality of gait 01/03/2011  . Personal history of fall 12/27/2010  . DISLOCATION CLOSED SHOULDER NEC 04/25/2010  . ANSERINE BURSITIS, LEFT 02/13/2010  . KNEE PAIN 12/22/2009  . LEG PAIN, BILATERAL 12/22/2009  . TOTAL KNEE FOLLOW-UP 12/22/2009  . HIP  PAIN 02/07/2009  . LOW BACK PAIN 02/07/2009   Teena Irani, PTA/CLT [phone]   07/17/2017, 1:45 PM  [facility] [address] [place], [place], [address] Phone: [phone]   Fax:  [phone]  Name: Cindy Schultz MRN: [id number] Date of Birth: 06-17-1924     PHYSICAL THERAPY DISCHARGE SUMMARY 08/01/2017   Visits from Start of Care: 3  Current functional level related to goals / functional outcomes: See above .   Pt called on 1/31 and stated that she was going to wait to see the MD on 07/29/2017 and if he wanted her to come back she would let us know.  Pt has not called therefore we  will  Discharge.    Remaining deficits: See above   Education / Equipment: HEP Plan: Patient agrees to discharge.  Patient goals were not met. Patient is being discharged due to not returning since the last visit.  ?????       Rayetta Humphrey, [place] CLT [phone]

## 2017-07-18 ENCOUNTER — Telehealth (HOSPITAL_COMMUNITY): Payer: Self-pay | Admitting: Pulmonary Disease

## 2017-07-18 DIAGNOSIS — I10 Essential (primary) hypertension: Secondary | ICD-10-CM | POA: Diagnosis not present

## 2017-07-18 DIAGNOSIS — G3184 Mild cognitive impairment, so stated: Secondary | ICD-10-CM | POA: Diagnosis not present

## 2017-07-18 DIAGNOSIS — S81812D Laceration without foreign body, left lower leg, subsequent encounter: Secondary | ICD-10-CM | POA: Diagnosis not present

## 2017-07-18 NOTE — Telephone Encounter (Signed)
1/31/pt called and said that Dr. Luan Pulling had wanted to see her on 2/11 and she said if she needed to come back for therapy she would call us19

## 2017-07-19 ENCOUNTER — Ambulatory Visit (INDEPENDENT_AMBULATORY_CARE_PROVIDER_SITE_OTHER): Payer: Medicare Other

## 2017-07-19 ENCOUNTER — Ambulatory Visit (INDEPENDENT_AMBULATORY_CARE_PROVIDER_SITE_OTHER): Payer: Medicare Other | Admitting: Orthopedic Surgery

## 2017-07-19 ENCOUNTER — Ambulatory Visit (HOSPITAL_COMMUNITY): Payer: Medicare Other

## 2017-07-19 ENCOUNTER — Encounter: Payer: Self-pay | Admitting: Orthopedic Surgery

## 2017-07-19 VITALS — BP 108/49 | HR 59 | Ht 62.0 in | Wt 125.0 lb

## 2017-07-19 DIAGNOSIS — M75101 Unspecified rotator cuff tear or rupture of right shoulder, not specified as traumatic: Secondary | ICD-10-CM | POA: Diagnosis not present

## 2017-07-19 DIAGNOSIS — M1611 Unilateral primary osteoarthritis, right hip: Secondary | ICD-10-CM | POA: Diagnosis not present

## 2017-07-19 DIAGNOSIS — M5440 Lumbago with sciatica, unspecified side: Secondary | ICD-10-CM

## 2017-07-19 MED ORDER — METHYLPREDNISOLONE ACETATE 40 MG/ML IJ SUSP
40.0000 mg | Freq: Once | INTRAMUSCULAR | Status: AC
  Administered 2017-07-19: 40 mg via INTRAMUSCULAR

## 2017-07-19 MED ORDER — TRAMADOL HCL 50 MG PO TABS: 50.0000 mg | ORAL_TABLET | Freq: Four times a day (QID) | ORAL | 1 refills | Status: DC | PRN

## 2017-07-19 NOTE — Progress Notes (Signed)
Progress Note   Patient ID: Cindy Schultz, female   DOB: 1923-12-13, 84 y.o.   MRN: [id number]  Chief Complaint  Patient presents with  . Back Pain    low back   . Shoulder Problem    states decreased ROM right shoulder denies pain     Back Pain: Patient presents for presents evaluation of low back problems.  Symptoms have been present for a few weeks and include pain in lower back  (aching, dull, sharp, shooting and throbbing in character; 5/10 in severity). Initial inciting event: none. Symptoms are worst: evening. Alleviating factors identifiable by patient are none. Exacerbating factors identifiable by patient are bending backwards, bending forwards, bending sideways, sitting, standing and walking. Treatments so far initiated by patient: none Previous lower back problems: none. Previous workup: none. Previous treatments: none.     Review of Systems  Constitutional: Negative for chills, fever, malaise/fatigue and weight loss.  Gastrointestinal: Negative.   Genitourinary: Negative.   Musculoskeletal: Positive for joint pain.  Neurological: Negative for tingling.   Current Meds  Medication Sig  . acetaminophen (TYLENOL) 500 MG tablet Take 500 mg by mouth every 6 (six) hours as needed (pain).   . Calcium 600-200 MG-UNIT per tablet Take 1 tablet by mouth daily.    . Cholecalciferol (VITAMIN D3) 10000 units TABS Take 1,000 Units by mouth daily.  . dorzolamide-timolol (COSOPT) 22.3-6.8 MG/ML ophthalmic solution Place 1 drop into both eyes 2 (two) times daily.    Marland Kitchen losartan (COZAAR) 50 MG tablet Take 50 mg by mouth daily.   Marland Kitchen LUMIGAN 0.01 % SOLN Place 1 drop into both eyes at bedtime.   . Melatonin 5 MG TABS Take by mouth at bedtime.  . meloxicam (MOBIC) 7.5 MG tablet Take 7.5 mg by mouth daily.  . Multiple Vitamins-Minerals (PRESERVISION AREDS 2 PO) Take by mouth. One tablet daily  . oxybutynin (DITROPAN-XL) 10 MG 24 hr tablet Take 10 mg by mouth daily.   . Plant Sterols and  Stanols (CHOLEST OFF PO) Take 1 tablet by mouth daily.  . polyethylene glycol (MIRALAX / GLYCOLAX) packet Take 17 g by mouth daily.    Past Medical History:  Diagnosis Date  . Cancer [facility]) March 2016   Skin Cancer :  Right Cheek     SCC  . Cancer [facility]) 2013   Skin Cancer  :  Anterior Neck   SCC  . Carotid artery occlusion   . Chronic kidney disease   . Diverticulitis   . DVT (deep venous thrombosis) ([place]) 07/14/10   LEA doppler   . GI bleeding    2013/2016/2018: suspected diverticular bleeding  . Glaucoma   . Hearing loss   . HTN (hypertension)    cholesterol  . Lumbar spine pain   . Lymphedema   . Macular degeneration   . Pain in joints   . Precancerous changes of the cervix    lesions  . Rectocele   . SOB (shortness of breath) 04/04/2010   2D echo EF=>55%     Allergies  Allergen Reactions  . Promethazine Hcl Nausea And Vomiting and Other (See Comments)    REACTION:Confusion  . Codeine Nausea And Vomiting  . Tramadol Hcl Nausea And Vomiting and Other (See Comments)    Motion Sickness  . Hydrocodone Other (See Comments)    Unknown Reaction     BP (!) 108/49   Pulse (!) 59   Ht 5\' 2"  (1.575 m)   Wt 125 lb (56.7 kg)  BMI 22.86 kg/m    Physical Exam  Constitutional: She is oriented to person, place, and time. She appears well-developed and well-nourished.  Musculoskeletal:       Lumbar back: She exhibits decreased range of motion, tenderness, bony tenderness, deformity, pain and spasm. She exhibits no swelling, no edema and no laceration.  Neurological: She is alert and oriented to person, place, and time. Gait abnormal.  Gait is shuffling   Psychiatric: She has a normal mood and affect. Judgment normal.  Vitals reviewed.   Right Hip Exam   Tenderness  The patient is experiencing no tenderness.   Range of Motion  Abduction: abnormal  Adduction: abnormal  Flexion: abnormal  Internal rotation: abnormal   Muscle Strength  Abduction: 5/5  Flexion:  5/5   Tests  FABER: negative  Other  Erythema: absent Sensation: normal Pulse: present   Left Hip Exam   Tenderness  The patient is experiencing no tenderness.   Range of Motion  Flexion: abnormal   Muscle Strength  Abduction: 5/5  Flexion: 5/5   Tests  FABER: negative [place]: negative  Other  Erythema: present Scars: present Sensation: normal Pulse: present       Medical decision-making  Imaging: see dictated report   Severe degenerative scoliosis and spondylosis and arthritis of the right hip grade 3 [place]  Encounter Diagnoses  Name Primary?  . Midline low back pain with sciatica, sciatica laterality unspecified, unspecified chronicity Yes  . Tear of right rotator cuff, unspecified tear extent   . Primary osteoarthritis of right hip      Procedure note the subacromial injection shoulder RIGHT  Verbal consent was obtained to inject the  RIGHT   Shoulder  Timeout was completed to confirm the injection site is a subacromial space of the  RIGHT  shoulder   Medication used Depo-Medrol 40 mg and lidocaine 1% 3 cc  Anesthesia was provided by ethyl chloride  The injection was performed in the RIGHT  posterior subacromial space. After pinning the skin with alcohol and anesthetized the skin with ethyl chloride the subacromial space was injected using a 20-gauge needle. There were no complications  Sterile dressing was applied.  Nurse order for IM shot right hip   A steroid injection was performed at right gluteus using 1% plain Lidocaine and 40 mg of depomedrol. This was well tolerated.  Fu prn    Arther Abbott, MD 07/19/2017 9:01 AM

## 2017-07-19 NOTE — Addendum Note (Signed)
Addended by: Arther Abbott E on: 07/19/2017 11:47 AM   Modules accepted: Orders

## 2017-07-19 NOTE — Addendum Note (Signed)
Addended byCandice Camp on: 07/19/2017 09:57 AM   Modules accepted: Orders

## 2017-07-19 NOTE — Patient Instructions (Signed)
Use heating pad on the lower back the next 2-3 weeks Aspercreme is okay as well to use for pain relief no heavy lifting

## 2017-07-22 DIAGNOSIS — F419 Anxiety disorder, unspecified: Secondary | ICD-10-CM | POA: Diagnosis not present

## 2017-07-22 DIAGNOSIS — G3184 Mild cognitive impairment, so stated: Secondary | ICD-10-CM | POA: Diagnosis not present

## 2017-07-22 DIAGNOSIS — S81812D Laceration without foreign body, left lower leg, subsequent encounter: Secondary | ICD-10-CM | POA: Diagnosis not present

## 2017-07-22 DIAGNOSIS — I10 Essential (primary) hypertension: Secondary | ICD-10-CM | POA: Diagnosis not present

## 2017-07-25 DIAGNOSIS — S81812D Laceration without foreign body, left lower leg, subsequent encounter: Secondary | ICD-10-CM | POA: Diagnosis not present

## 2017-07-25 DIAGNOSIS — I10 Essential (primary) hypertension: Secondary | ICD-10-CM | POA: Diagnosis not present

## 2017-07-25 DIAGNOSIS — I129 Hypertensive chronic kidney disease with stage 1 through stage 4 chronic kidney disease, or unspecified chronic kidney disease: Secondary | ICD-10-CM | POA: Diagnosis not present

## 2017-07-25 DIAGNOSIS — G3184 Mild cognitive impairment, so stated: Secondary | ICD-10-CM | POA: Diagnosis not present

## 2017-07-29 ENCOUNTER — Ambulatory Visit (HOSPITAL_COMMUNITY): Payer: Medicare Other | Admitting: Physical Therapy

## 2017-07-31 ENCOUNTER — Ambulatory Visit (HOSPITAL_COMMUNITY): Payer: Medicare Other | Admitting: Physical Therapy

## 2017-08-02 ENCOUNTER — Ambulatory Visit (HOSPITAL_COMMUNITY): Payer: Medicare Other

## 2017-08-13 DIAGNOSIS — H3530 Unspecified macular degeneration: Secondary | ICD-10-CM | POA: Diagnosis not present

## 2017-08-13 DIAGNOSIS — Z961 Presence of intraocular lens: Secondary | ICD-10-CM | POA: Diagnosis not present

## 2017-08-13 DIAGNOSIS — Z9849 Cataract extraction status, unspecified eye: Secondary | ICD-10-CM | POA: Diagnosis not present

## 2017-08-13 DIAGNOSIS — H43813 Vitreous degeneration, bilateral: Secondary | ICD-10-CM | POA: Diagnosis not present

## 2017-08-13 DIAGNOSIS — X32XXXD Exposure to sunlight, subsequent encounter: Secondary | ICD-10-CM | POA: Diagnosis not present

## 2017-08-13 DIAGNOSIS — L570 Actinic keratosis: Secondary | ICD-10-CM | POA: Diagnosis not present

## 2017-08-14 DIAGNOSIS — S81812D Laceration without foreign body, left lower leg, subsequent encounter: Secondary | ICD-10-CM | POA: Diagnosis not present

## 2017-08-14 DIAGNOSIS — I10 Essential (primary) hypertension: Secondary | ICD-10-CM | POA: Diagnosis not present

## 2017-08-14 DIAGNOSIS — M79605 Pain in left leg: Secondary | ICD-10-CM | POA: Diagnosis not present

## 2017-08-14 DIAGNOSIS — M159 Polyosteoarthritis, unspecified: Secondary | ICD-10-CM | POA: Diagnosis not present

## 2017-08-20 DIAGNOSIS — H43813 Vitreous degeneration, bilateral: Secondary | ICD-10-CM | POA: Diagnosis not present

## 2017-08-20 DIAGNOSIS — H353132 Nonexudative age-related macular degeneration, bilateral, intermediate dry stage: Secondary | ICD-10-CM | POA: Diagnosis not present

## 2017-08-20 DIAGNOSIS — H401134 Primary open-angle glaucoma, bilateral, indeterminate stage: Secondary | ICD-10-CM | POA: Diagnosis not present

## 2017-08-22 DIAGNOSIS — R109 Unspecified abdominal pain: Secondary | ICD-10-CM | POA: Diagnosis not present

## 2017-08-22 DIAGNOSIS — I10 Essential (primary) hypertension: Secondary | ICD-10-CM | POA: Diagnosis not present

## 2017-08-22 DIAGNOSIS — K210 Gastro-esophageal reflux disease with esophagitis: Secondary | ICD-10-CM | POA: Diagnosis not present

## 2017-08-22 DIAGNOSIS — M545 Low back pain: Secondary | ICD-10-CM | POA: Diagnosis not present

## 2017-08-27 ENCOUNTER — Ambulatory Visit (INDEPENDENT_AMBULATORY_CARE_PROVIDER_SITE_OTHER): Payer: Medicare Other | Admitting: Orthopedic Surgery

## 2017-08-27 ENCOUNTER — Encounter: Payer: Self-pay | Admitting: Orthopedic Surgery

## 2017-08-27 VITALS — BP 120/62 | HR 80 | Ht 62.0 in | Wt 123.0 lb

## 2017-08-27 DIAGNOSIS — M5440 Lumbago with sciatica, unspecified side: Secondary | ICD-10-CM

## 2017-08-27 MED ORDER — METHYLPREDNISOLONE ACETATE 40 MG/ML IJ SUSP
40.0000 mg | Freq: Once | INTRAMUSCULAR | Status: AC
  Administered 2017-08-27: 40 mg via INTRAMUSCULAR

## 2017-08-27 NOTE — Progress Notes (Signed)
Chief Complaint  Patient presents with  . Back Pain    low back down left leg     History 84 year old female spinal stenosis lower back pain left leg radicular pain presents with recurrent pain in the left leg radiating from the left lower back across the hip into the foot with giving way symptoms  Prior x-rays show severe scoliosis and spondylosis  Review of systems no bowel or bladder dysfunction at this time  Past Medical History:  Diagnosis Date  . Cancer [facility]) March 2016   Skin Cancer :  Right Cheek     SCC  . Cancer [facility]) 2013   Skin Cancer  :  Anterior Neck   SCC  . Carotid artery occlusion   . Chronic kidney disease   . Diverticulitis   . DVT (deep venous thrombosis) ([place]) 07/14/10   LEA doppler   . GI bleeding    2013/2016/2018: suspected diverticular bleeding  . Glaucoma   . Hearing loss   . HTN (hypertension)    cholesterol  . Lumbar spine pain   . Lymphedema   . Macular degeneration   . Pain in joints   . Precancerous changes of the cervix    lesions  . Rectocele   . SOB (shortness of breath) 04/04/2010   2D echo EF=>55%   Allergies  Allergen Reactions  . Promethazine Hcl Nausea And Vomiting and Other (See Comments)    REACTION:Confusion  . Codeine Nausea And Vomiting  . Tramadol Hcl Nausea And Vomiting and Other (See Comments)    Motion Sickness  . Hydrocodone Other (See Comments)    Unknown Reaction    BP 120/62   Pulse 80   Ht 5\' 2"  (1.575 m)   Wt 123 lb (55.8 kg)   BMI 22.50 kg/m  Physical Exam  Constitutional: She is oriented to person, place, and time. She appears well-developed and well-nourished.  Musculoskeletal:       Arms: Neurological: She is alert and oriented to person, place, and time.  Psychiatric: She has a normal mood and affect. Judgment normal.  Vitals reviewed.   No diagnosis found.  She is not a surgical candidate so I gave her an IM shot of Depo-Medrol 40 via the nurse left hip

## 2017-08-27 NOTE — Addendum Note (Signed)
Addended byCandice Camp on: 08/27/2017 03:02 PM   Modules accepted: Orders

## 2017-08-28 ENCOUNTER — Encounter (HOSPITAL_COMMUNITY): Payer: Self-pay | Admitting: *Deleted

## 2017-08-28 ENCOUNTER — Observation Stay (HOSPITAL_COMMUNITY)
Admission: AD | Admit: 2017-08-28 | Discharge: 2017-08-29 | Disposition: A | Discharge status: Home / Self Care | Payer: Medicare Other | Source: Ambulatory Visit | Attending: Pulmonary Disease | Admitting: Pulmonary Disease

## 2017-08-28 ENCOUNTER — Other Ambulatory Visit: Payer: Self-pay

## 2017-08-28 ENCOUNTER — Observation Stay (HOSPITAL_COMMUNITY): Payer: Medicare Other

## 2017-08-28 ENCOUNTER — Ambulatory Visit: Payer: Medicare Other | Admitting: Orthopedic Surgery

## 2017-08-28 DIAGNOSIS — Z79899 Other long term (current) drug therapy: Secondary | ICD-10-CM | POA: Diagnosis not present

## 2017-08-28 DIAGNOSIS — R1032 Left lower quadrant pain: Secondary | ICD-10-CM | POA: Diagnosis not present

## 2017-08-28 DIAGNOSIS — R109 Unspecified abdominal pain: Secondary | ICD-10-CM | POA: Diagnosis present

## 2017-08-28 DIAGNOSIS — M545 Low back pain, unspecified: Secondary | ICD-10-CM | POA: Diagnosis present

## 2017-08-28 DIAGNOSIS — M1712 Unilateral primary osteoarthritis, left knee: Secondary | ICD-10-CM | POA: Diagnosis not present

## 2017-08-28 DIAGNOSIS — I10 Essential (primary) hypertension: Secondary | ICD-10-CM | POA: Diagnosis present

## 2017-08-28 DIAGNOSIS — R1084 Generalized abdominal pain: Secondary | ICD-10-CM | POA: Diagnosis not present

## 2017-08-28 DIAGNOSIS — N183 Chronic kidney disease, stage 3 unspecified: Secondary | ICD-10-CM | POA: Diagnosis present

## 2017-08-28 DIAGNOSIS — K5900 Constipation, unspecified: Secondary | ICD-10-CM | POA: Diagnosis not present

## 2017-08-28 DIAGNOSIS — M1611 Unilateral primary osteoarthritis, right hip: Secondary | ICD-10-CM | POA: Insufficient documentation

## 2017-08-28 DIAGNOSIS — I129 Hypertensive chronic kidney disease with stage 1 through stage 4 chronic kidney disease, or unspecified chronic kidney disease: Secondary | ICD-10-CM | POA: Diagnosis not present

## 2017-08-28 DIAGNOSIS — K5730 Diverticulosis of large intestine without perforation or abscess without bleeding: Secondary | ICD-10-CM | POA: Diagnosis not present

## 2017-08-28 LAB — COMPREHENSIVE METABOLIC PANEL
ALT: 19 U/L (ref 14–54)
AST: 20 U/L (ref 15–41)
Albumin: 3.5 g/dL (ref 3.5–5.0)
Alkaline Phosphatase: 83 U/L (ref 38–126)
Anion gap: 10 (ref 5–15)
BILIRUBIN TOTAL: 0.7 mg/dL (ref 0.3–1.2)
BUN: 29 mg/dL — AB (ref 6–20)
CALCIUM: 9.2 mg/dL (ref 8.9–10.3)
CO2: 24 mmol/L (ref 22–32)
CREATININE: 1.36 mg/dL — AB (ref 0.44–1.00)
Chloride: 106 mmol/L (ref 101–111)
GFR, EST AFRICAN AMERICAN: 38 mL/min — AB (ref >60)
GFR, EST NON AFRICAN AMERICAN: 33 mL/min — AB (ref >60)
Glucose, Bld: 137 mg/dL — ABNORMAL HIGH (ref 65–99)
Potassium: 4.6 mmol/L (ref 3.5–5.1)
Sodium: 140 mmol/L (ref 135–145)
TOTAL PROTEIN: 6.5 g/dL (ref 6.5–8.1)

## 2017-08-28 LAB — CBC WITH DIFFERENTIAL/PLATELET
BASOS ABS: 0 10*3/uL (ref 0.0–0.1)
BASOS PCT: 0 %
EOS PCT: 3 %
Eosinophils Absolute: 0.2 10*3/uL (ref 0.0–0.7)
HCT: 35.3 % — ABNORMAL LOW (ref 36.0–46.0)
Hemoglobin: 11 g/dL — ABNORMAL LOW (ref 12.0–15.0)
Lymphocytes Relative: 22 %
Lymphs Abs: 1.4 10*3/uL (ref 0.7–4.0)
MCH: 31.6 pg (ref 26.0–34.0)
MCHC: 31.2 g/dL (ref 30.0–36.0)
MCV: 101.4 fL — ABNORMAL HIGH (ref 78.0–100.0)
Monocytes Absolute: 1 10*3/uL (ref 0.1–1.0)
Monocytes Relative: 16 %
Neutro Abs: 3.7 10*3/uL (ref 1.7–7.7)
Neutrophils Relative %: 59 %
PLATELETS: 223 10*3/uL (ref 150–400)
RBC: 3.48 MIL/uL — AB (ref 3.87–5.11)
RDW: 14.5 % (ref 11.5–15.5)
WBC: 6.4 10*3/uL (ref 4.0–10.5)

## 2017-08-28 MED ORDER — MELATONIN 5 MG PO TABS
5.0000 mg | ORAL_TABLET | Freq: Every day | ORAL | Status: DC
  Filled 2017-08-28 (×2): qty 1

## 2017-08-28 MED ORDER — LOSARTAN POTASSIUM 50 MG PO TABS
50.0000 mg | ORAL_TABLET | Freq: Every day | ORAL | Status: DC
  Administered 2017-08-28: 50 mg via ORAL
  Filled 2017-08-28: qty 1

## 2017-08-28 MED ORDER — DORZOLAMIDE HCL-TIMOLOL MAL 2-0.5 % OP SOLN
1.0000 [drp] | Freq: Two times a day (BID) | OPHTHALMIC | Status: DC
  Administered 2017-08-28: 1 [drp] via OPHTHALMIC
  Filled 2017-08-28: qty 10

## 2017-08-28 MED ORDER — OCUVITE-LUTEIN PO CAPS: ORAL_CAPSULE | Freq: Every morning | ORAL | Status: DC

## 2017-08-28 MED ORDER — SODIUM CHLORIDE 0.9 % IV SOLN
INTRAVENOUS | Status: DC
  Administered 2017-08-28: 18:00:00 via INTRAVENOUS

## 2017-08-28 MED ORDER — OXYBUTYNIN CHLORIDE ER 5 MG PO TB24
10.0000 mg | ORAL_TABLET | Freq: Every day | ORAL | Status: DC
  Administered 2017-08-28: 10 mg via ORAL
  Filled 2017-08-28: qty 2

## 2017-08-28 MED ORDER — ACETAMINOPHEN 325 MG PO TABS: 650.0000 mg | ORAL_TABLET | Freq: Four times a day (QID) | ORAL | Status: DC | PRN

## 2017-08-28 MED ORDER — TRAMADOL HCL 50 MG PO TABS: 50.0000 mg | ORAL_TABLET | Freq: Four times a day (QID) | ORAL | Status: DC | PRN

## 2017-08-28 MED ORDER — POLYETHYLENE GLYCOL 3350 17 G PO PACK
17.0000 g | PACK | Freq: Every day | ORAL | Status: DC
Start: 2017-08-28 — End: 2017-08-29
  Administered 2017-08-28: 17 g via ORAL
  Filled 2017-08-28: qty 1

## 2017-08-28 MED ORDER — MELATONIN 3 MG PO TABS
6.0000 mg | ORAL_TABLET | Freq: Every day | ORAL | Status: DC
  Administered 2017-08-28: 6 mg via ORAL
  Filled 2017-08-28 (×2): qty 2

## 2017-08-28 MED ORDER — IOPAMIDOL (ISOVUE-300) INJECTION 61%
75.0000 mL | Freq: Once | INTRAVENOUS | Status: AC | PRN
  Administered 2017-08-28: 75 mL via INTRAVENOUS

## 2017-08-28 MED ORDER — LATANOPROST 0.005 % OP SOLN
1.0000 [drp] | Freq: Every day | OPHTHALMIC | Status: DC
  Administered 2017-08-28: 1 [drp] via OPHTHALMIC
  Filled 2017-08-28: qty 2.5

## 2017-08-28 MED ORDER — ACETAMINOPHEN 650 MG RE SUPP: 650.0000 mg | Freq: Four times a day (QID) | RECTAL | Status: DC | PRN

## 2017-08-28 NOTE — H&P (Signed)
Full note to follow. She came to my office complainnig of abdominal bloating and dark stools. She has had 2 episodes of gi bleeding.  Stool in office green and ? For blood. She does have abdominal distention/ tenderness llq

## 2017-08-29 DIAGNOSIS — M545 Low back pain: Secondary | ICD-10-CM | POA: Diagnosis not present

## 2017-08-29 DIAGNOSIS — I129 Hypertensive chronic kidney disease with stage 1 through stage 4 chronic kidney disease, or unspecified chronic kidney disease: Secondary | ICD-10-CM | POA: Diagnosis not present

## 2017-08-29 DIAGNOSIS — N183 Chronic kidney disease, stage 3 (moderate): Secondary | ICD-10-CM | POA: Diagnosis not present

## 2017-08-29 DIAGNOSIS — R1032 Left lower quadrant pain: Secondary | ICD-10-CM | POA: Diagnosis not present

## 2017-08-29 DIAGNOSIS — M1712 Unilateral primary osteoarthritis, left knee: Secondary | ICD-10-CM | POA: Diagnosis not present

## 2017-08-29 DIAGNOSIS — R1084 Generalized abdominal pain: Secondary | ICD-10-CM | POA: Diagnosis not present

## 2017-08-29 DIAGNOSIS — K5900 Constipation, unspecified: Secondary | ICD-10-CM | POA: Diagnosis not present

## 2017-08-29 LAB — CBC
HEMATOCRIT: 33.6 % — AB (ref 36.0–46.0)
Hemoglobin: 10.5 g/dL — ABNORMAL LOW (ref 12.0–15.0)
MCH: 31.6 pg (ref 26.0–34.0)
MCHC: 31.3 g/dL (ref 30.0–36.0)
MCV: 101.2 fL — AB (ref 78.0–100.0)
Platelets: 204 10*3/uL (ref 150–400)
RBC: 3.32 MIL/uL — ABNORMAL LOW (ref 3.87–5.11)
RDW: 14.7 % (ref 11.5–15.5)
WBC: 6.5 10*3/uL (ref 4.0–10.5)

## 2017-08-29 LAB — BASIC METABOLIC PANEL
Anion gap: 9 (ref 5–15)
BUN: 23 mg/dL — AB (ref 6–20)
CHLORIDE: 109 mmol/L (ref 101–111)
CO2: 22 mmol/L (ref 22–32)
Calcium: 8.6 mg/dL — ABNORMAL LOW (ref 8.9–10.3)
Creatinine, Ser: 1.33 mg/dL — ABNORMAL HIGH (ref 0.44–1.00)
GFR calc Af Amer: 39 mL/min — ABNORMAL LOW (ref >60)
GFR calc non Af Amer: 34 mL/min — ABNORMAL LOW (ref >60)
GLUCOSE: 90 mg/dL (ref 65–99)
Potassium: 4.2 mmol/L (ref 3.5–5.1)
Sodium: 140 mmol/L (ref 135–145)

## 2017-08-29 NOTE — Discharge Summary (Signed)
Physician Discharge Summary  Patient ID: Cindy Schultz MRN: [id number] DOB/AGE: 08-20-1923 84 y.o. Primary Care Physician:Aija Scarfo, Percell Miller, MD Admit date: 08/28/2017 Discharge date: 08/29/2017    Discharge Diagnoses:   Active Problems:   LOW BACK PAIN   HTN (hypertension)   Osteoarthritis of left knee   Constipation   CKD (chronic kidney disease) stage 3, GFR 30-59 ml/min (HCC)   Primary osteoarthritis of right hip   Abdominal pain   Allergies as of 08/29/2017      Reactions   Promethazine Hcl Nausea And Vomiting, Other (See Comments)   REACTION:Confusion   Codeine Nausea And Vomiting   Tramadol Hcl Nausea And Vomiting, Other (See Comments)   Motion Sickness   Hydrocodone Other (See Comments)   Unknown Reaction       Medication List    TAKE these medications   acetaminophen 500 MG tablet Commonly known as:  TYLENOL Take 500 mg by mouth every 6 (six) hours as needed (pain).   Calcium 600-200 MG-UNIT tablet Take 1 tablet by mouth daily.   dorzolamide-timolol 22.3-6.8 MG/ML ophthalmic solution Commonly known as:  COSOPT Place 1 drop into both eyes 2 (two) times daily.   losartan 50 MG tablet Commonly known as:  COZAAR Take 50 mg by mouth daily.   LUMIGAN 0.01 % Soln Generic drug:  bimatoprost Place 1 drop into both eyes at bedtime.   Melatonin 5 MG Tabs Take by mouth at bedtime.   oxybutynin 10 MG 24 hr tablet Commonly known as:  DITROPAN-XL Take 10 mg by mouth daily.   polyethylene glycol packet Commonly known as:  MIRALAX / GLYCOLAX Take 17 g by mouth daily.   PRESERVISION AREDS 2 PO Take 2 tablets by mouth every morning. One tablet daily   traMADol 50 MG tablet Commonly known as:  ULTRAM Take 1 tablet (50 mg total) by mouth every 6 (six) hours as needed.       Discharged Condition: Improved    Consults: None  Significant Diagnostic Studies: Ct Abdomen Pelvis W Contrast  Result Date: 08/28/2017 CLINICAL DATA:  Left lower quadrant pain  and abdominal distention x1 week. Abdominal bloating and dark stools. Patient has had 2 episodes of gastrointestinal bleeding. EXAM: CT ABDOMEN AND PELVIS WITH CONTRAST TECHNIQUE: Multidetector CT imaging of the abdomen and pelvis was performed using the standard protocol following bolus administration of intravenous contrast. CONTRAST:  65mL ISOVUE-300 IOPAMIDOL (ISOVUE-300) INJECTION 61% COMPARISON:  09/08/2014 FINDINGS: Lower chest: Coronary arteriosclerosis is noted along the LAD and left circumflex. Heart is borderline enlarged without pericardial effusion. Mild aortic atherosclerosis of the descending thoracic aorta. Small granuloma is seen in the right middle lobe. There is passive atelectasis at the lung bases. Hepatobiliary: Dependent gallstones without secondary signs of acute cholecystitis. No enhancing mass lesions of the liver. Pancreas: Atrophic pancreas without inflammation or ductal dilatation. No mass. Spleen: Normal Adrenals/Urinary Tract: Normal bilateral adrenal glands. Mild bilateral renal cortical thinning without mass or nephrolithiasis. No hydroureteronephrosis. The urinary bladder is distended without focal mural thickening or calculus. Stomach/Bowel: Small hiatal hernia. The stomach is decompressed in appearance. Small intraluminal hypodensities are seen within the stomach likely representing ingested food material or pills. Small polyps are not entirely excluded. These measure up to 6 mm. Normal small bowel rotation is identified without bowel obstruction or inflammation. The distal and terminal ileum are normal. Patient is status post appendectomy. Circular muscle hypertrophy with extensive descending and sigmoid diverticulosis is noted. Vascular/Lymphatic: Mild aortoiliac atherosclerosis. No aneurysm or adenopathy. Reproductive: Status  post hysterectomy.  No adnexal mass. Other: Small fat containing hernias bilaterally. Musculoskeletal: Degenerative disc disease L3-4 and L5-S1. No  aggressive osseous lesions nor fracture. IMPRESSION: 1. Extensive left-sided colonic diverticulosis with circular muscle hypertrophy. 2. No acute bowel inflammation or obstruction. 3. Coronary arteriosclerosis along the LAD and left circumflex. 4. Uncomplicated cholelithiasis. 5. Soft tissue densities within the stomach likely represent ingested food or pills. Small polyps are not entirely excluded. 6. Degenerative disc disease L3-4 and L5-S1. Electronically Signed   By: Ashley Royalty M.D.   On: 08/28/2017 23:17    Lab Results: Basic Metabolic Panel: Recent Labs    08/28/17 1729 08/29/17 0419  NA 140 140  K 4.6 4.2  CL 106 109  CO2 24 22  GLUCOSE 137* 90  BUN 29* 23*  CREATININE 1.36* 1.33*  CALCIUM 9.2 8.6*   Liver Function Tests: Recent Labs    08/28/17 1729  AST 20  ALT 19  ALKPHOS 83  BILITOT 0.7  PROT 6.5  ALBUMIN 3.5     CBC: Recent Labs    08/28/17 1729 08/29/17 0419  WBC 6.4 6.5  NEUTROABS 3.7  --   HGB 11.0* 10.5*  HCT 35.3* 33.6*  MCV 101.4* 101.2*  PLT 223 204    No results found for this or any previous visit (from the past 240 hour(s)).   Hospital Course: This is a patient that came to my office with complaints of dark stools and abdominal discomfort.  She described the stools as black but in my office her stool was green.  However she does have a history of GI bleeding in the past and because of that she was brought in for observation.  She had CT of the abdomen that showed she had diverticulosis but no other significant abdominal abnormalities that are not chronic.  No evidence of bleeding or colitis.  Her hemoglobin level was 11 on admission 10.5 the next morning after hydration and she did not have any dark stools.  She gave additional history that she had been taking Pepto-Bismol which certainly could have turned her stools black.  Her abdominal pain resolved and she was ready for discharge the next morning  Discharge Exam: Blood pressure (!) 119/53,  pulse 65, temperature 98.3 F (36.8 C), temperature source Oral, resp. rate 18, height 5\' 2"  (1.575 m), weight 55.8 kg (123 lb 0.3 oz), SpO2 97 %. She is awake and alert.  Abdomen is soft.  Chest is clear.  She is hard of hearing.  Disposition: Home she does not feel she needs home health services  Discharge Instructions    Diet - low sodium heart healthy   Complete by:  As directed    Increase activity slowly   Complete by:  As directed         Signed: Vannessa Godown L   08/29/2017, 8:33 AM

## 2017-08-29 NOTE — Progress Notes (Signed)
Subjective: She says she feels better.  No new complaints.  She gives additional history that she took Pepto-Bismol before she started having black stools and that is probably the etiology of the black stools.  Her hemoglobin level is stable within the limits of rehydration  Objective: Vital signs in last 24 hours: Temp:  [98.2 F (36.8 C)-98.6 F (37 C)] 98.3 F (36.8 C) (03/14 0505) Pulse Rate:  [65-80] 65 (03/14 0505) Resp:  [18] 18 (03/13 1705) BP: (119-147)/(53-71) 119/53 (03/14 0505) SpO2:  [96 %-98 %] 97 % (03/14 0505) Weight:  [55.7 kg (122 lb 12.7 oz)-55.8 kg (123 lb 0.3 oz)] 55.8 kg (123 lb 0.3 oz) (03/14 0505) Weight change:  Last BM Date: 08/28/17  Intake/Output from previous day: 03/13 0701 - 03/14 0700 In: 739.2 [P.O.:240; I.V.:499.2] Out: 800 [Urine:800]  PHYSICAL EXAM General appearance: alert, cooperative and no distress Resp: clear to auscultation bilaterally Cardio: regular rate and rhythm, S1, S2 normal, no murmur, click, rub or gallop GI: soft, non-tender; bowel sounds normal; no masses,  no organomegaly Extremities: extremities normal, atraumatic, no cyanosis or edema Very hard of hearing  Lab Results:  Results for orders placed or performed during the hospital encounter of 08/28/17 (from the past 48 hour(s))  Comprehensive metabolic panel     Status: Abnormal   Collection Time: 08/28/17  5:29 PM  Result Value Ref Range   Sodium 140 135 - 145 mmol/L   Potassium 4.6 3.5 - 5.1 mmol/L   Chloride 106 101 - 111 mmol/L   CO2 24 22 - 32 mmol/L   Glucose, Bld 137 (H) 65 - 99 mg/dL   BUN 29 (H) 6 - 20 mg/dL   Creatinine, Ser 1.36 (H) 0.44 - 1.00 mg/dL   Calcium 9.2 8.9 - 10.3 mg/dL   Total Protein 6.5 6.5 - 8.1 g/dL   Albumin 3.5 3.5 - 5.0 g/dL   AST 20 15 - 41 U/L   ALT 19 14 - 54 U/L   Alkaline Phosphatase 83 38 - 126 U/L   Total Bilirubin 0.7 0.3 - 1.2 mg/dL   GFR calc non Af Amer 33 (L) >60 mL/min   GFR calc Af Amer 38 (L) >60 mL/min    Comment:  (NOTE) The eGFR has been calculated using the CKD EPI equation. This calculation has not been validated in all clinical situations. eGFR's persistently <60 mL/min signify possible Chronic Kidney Disease.    Anion gap 10 5 - 15    Comment: Performed at [facility], [address], [place], [place] [address]  CBC WITH DIFFERENTIAL     Status: Abnormal   Collection Time: 08/28/17  5:29 PM  Result Value Ref Range   WBC 6.4 4.0 - 10.5 K/uL   RBC 3.48 (L) 3.87 - 5.11 MIL/uL   Hemoglobin 11.0 (L) 12.0 - 15.0 g/dL   HCT 35.3 (L) 36.0 - 46.0 %   MCV 101.4 (H) 78.0 - 100.0 fL   MCH 31.6 26.0 - 34.0 pg   MCHC 31.2 30.0 - 36.0 g/dL   RDW 14.5 11.5 - 15.5 %   Platelets 223 150 - 400 K/uL   Neutrophils Relative % 59 %   Neutro Abs 3.7 1.7 - 7.7 K/uL   Lymphocytes Relative 22 %   Lymphs Abs 1.4 0.7 - 4.0 K/uL   Monocytes Relative 16 %   Monocytes Absolute 1.0 0.1 - 1.0 K/uL   Eosinophils Relative 3 %   Eosinophils Absolute 0.2 0.0 - 0.7 K/uL   Basophils  Relative 0 %   Basophils Absolute 0.0 0.0 - 0.1 K/uL    Comment: Performed at [facility], [address], [place], [place] [address]  Basic metabolic panel     Status: Abnormal   Collection Time: 08/29/17  4:19 AM  Result Value Ref Range   Sodium 140 135 - 145 mmol/L   Potassium 4.2 3.5 - 5.1 mmol/L   Chloride 109 101 - 111 mmol/L   CO2 22 22 - 32 mmol/L   Glucose, Bld 90 65 - 99 mg/dL   BUN 23 (H) 6 - 20 mg/dL   Creatinine, Ser 1.33 (H) 0.44 - 1.00 mg/dL   Calcium 8.6 (L) 8.9 - 10.3 mg/dL   GFR calc non Af Amer 34 (L) >60 mL/min   GFR calc Af Amer 39 (L) >60 mL/min    Comment: (NOTE) The eGFR has been calculated using the CKD EPI equation. This calculation has not been validated in all clinical situations. eGFR's persistently <60 mL/min signify possible Chronic Kidney Disease.    Anion gap 9 5 - 15    Comment: Performed at [facility], [address] [place], [place] [address]  CBC     Status: Abnormal   Collection Time:  08/29/17  4:19 AM  Result Value Ref Range   WBC 6.5 4.0 - 10.5 K/uL   RBC 3.32 (L) 3.87 - 5.11 MIL/uL   Hemoglobin 10.5 (L) 12.0 - 15.0 g/dL   HCT 33.6 (L) 36.0 - 46.0 %   MCV 101.2 (H) 78.0 - 100.0 fL   MCH 31.6 26.0 - 34.0 pg   MCHC 31.3 30.0 - 36.0 g/dL   RDW 14.7 11.5 - 15.5 %   Platelets 204 150 - 400 K/uL    Comment: Performed at [facility], [address], [place], [place] [address]    ABGS No results for input(s): PHART, PO2ART, TCO2, HCO3 in the last 72 hours.  Invalid input(s): PCO2 CULTURES No results found for this or any previous visit (from the past 240 hour(s)). Studies/Results: Ct Abdomen Pelvis W Contrast  Result Date: 08/28/2017 CLINICAL DATA:  Left lower quadrant pain and abdominal distention x1 week. Abdominal bloating and dark stools. Patient has had 2 episodes of gastrointestinal bleeding. EXAM: CT ABDOMEN AND PELVIS WITH CONTRAST TECHNIQUE: Multidetector CT imaging of the abdomen and pelvis was performed using the standard protocol following bolus administration of intravenous contrast. CONTRAST:  68m ISOVUE-300 IOPAMIDOL (ISOVUE-300) INJECTION 61% COMPARISON:  09/08/2014 FINDINGS: Lower chest: Coronary arteriosclerosis is noted along the LAD and left circumflex. Heart is borderline enlarged without pericardial effusion. Mild aortic atherosclerosis of the descending thoracic aorta. Small granuloma is seen in the right middle lobe. There is passive atelectasis at the lung bases. Hepatobiliary: Dependent gallstones without secondary signs of acute cholecystitis. No enhancing mass lesions of the liver. Pancreas: Atrophic pancreas without inflammation or ductal dilatation. No mass. Spleen: Normal Adrenals/Urinary Tract: Normal bilateral adrenal glands. Mild bilateral renal cortical thinning without mass or nephrolithiasis. No hydroureteronephrosis. The urinary bladder is distended without focal mural thickening or calculus. Stomach/Bowel: Small hiatal hernia. The stomach  is decompressed in appearance. Small intraluminal hypodensities are seen within the stomach likely representing ingested food material or pills. Small polyps are not entirely excluded. These measure up to 6 mm. Normal small bowel rotation is identified without bowel obstruction or inflammation. The distal and terminal ileum are normal. Patient is status post appendectomy. Circular muscle hypertrophy with extensive descending and sigmoid diverticulosis is noted. Vascular/Lymphatic: Mild aortoiliac atherosclerosis. No aneurysm  or adenopathy. Reproductive: Status post hysterectomy.  No adnexal mass. Other: Small fat containing hernias bilaterally. Musculoskeletal: Degenerative disc disease L3-4 and L5-S1. No aggressive osseous lesions nor fracture. IMPRESSION: 1. Extensive left-sided colonic diverticulosis with circular muscle hypertrophy. 2. No acute bowel inflammation or obstruction. 3. Coronary arteriosclerosis along the LAD and left circumflex. 4. Uncomplicated cholelithiasis. 5. Soft tissue densities within the stomach likely represent ingested food or pills. Small polyps are not entirely excluded. 6. Degenerative disc disease L3-4 and L5-S1. Electronically Signed   By: Ashley Royalty M.D.   On: 08/28/2017 23:17    Medications:  Prior to Admission:  Medications Prior to Admission  Medication Sig Dispense Refill Last Dose  . acetaminophen (TYLENOL) 500 MG tablet Take 500 mg by mouth every 6 (six) hours as needed (pain).    unknown  . Calcium 600-200 MG-UNIT per tablet Take 1 tablet by mouth daily.     08/27/2017 at Unknown time  . dorzolamide-timolol (COSOPT) 22.3-6.8 MG/ML ophthalmic solution Place 1 drop into both eyes 2 (two) times daily.     08/27/2017 at Unknown time  . losartan (COZAAR) 50 MG tablet Take 50 mg by mouth daily.    08/27/2017 at Unknown time  . LUMIGAN 0.01 % SOLN Place 1 drop into both eyes at bedtime.    08/27/2017 at Unknown time  . Melatonin 5 MG TABS Take by mouth at bedtime.    08/27/2017 at Unknown time  . Multiple Vitamins-Minerals (PRESERVISION AREDS 2 PO) Take 2 tablets by mouth every morning. One tablet daily    08/27/2017 at Unknown time  . oxybutynin (DITROPAN-XL) 10 MG 24 hr tablet Take 10 mg by mouth daily.    08/27/2017 at Unknown time  . polyethylene glycol (MIRALAX / GLYCOLAX) packet Take 17 g by mouth daily.   08/27/2017 at Unknown time  . traMADol (ULTRAM) 50 MG tablet Take 1 tablet (50 mg total) by mouth every 6 (six) hours as needed. 60 tablet 1 unknown  . [DISCONTINUED] meloxicam (MOBIC) 7.5 MG tablet Take 7.5 mg by mouth daily.   Taking  . [DISCONTINUED] Plant Sterols and Stanols (CHOLEST OFF PO) Take 1 tablet by mouth daily.   Taking   Scheduled: . dorzolamide-timolol  1 drop Both Eyes BID  . latanoprost  1 drop Both Eyes QHS  . losartan  50 mg Oral Daily  . Melatonin  6 mg Oral QHS  . multivitamin-lutein   Oral q morning - 10a  . oxybutynin  10 mg Oral Daily  . polyethylene glycol  17 g Oral Daily   Continuous: . sodium chloride 50 mL/hr at 08/29/17 0400   HWK:[id number] **OR** acetaminophen, traMADol  Assesment: She was admitted with concern that she might be having GI bleeding with her previous history.  She does not appear to be bleeding now.  CT shows diverticulosis but no other significant abnormalities that would cause her to bleed.  Hemoglobin level is stable.  Her abdominal discomfort has resolved.  I think she is ready for discharge. Active Problems:   Abdominal pain    Plan: Discharge home today    LOS: 0 days   Kemarion Abbey L 08/29/2017, 8:29 AM

## 2017-08-29 NOTE — H&P (Signed)
Cindy Schultz MRN: [id number] DOB/AGE: June 15, 1924 84 y.o. Primary Care Physician:Ohana Birdwell, Percell Miller, MD Admit date: 08/28/2017 Chief Complaint: Abdominal pain, dark stools HPI: This is completion of history and physical done in my office yesterday.  This is a 84 year old who came into my office complaining of abdominal bloating and dark stools.  She has had 2 previous episodes of GI bleeding 1 of which was a massive bleed.  She says she started having trouble 3 or 4 days ago.  She had been on an anti-inflammatory because of pain in her knee saw her orthopedist on the day prior to admission and was told to discontinue the anti-inflammatory.  She says that her stools have been black.  She has not had any nausea vomiting diarrhea.  She has had minimal abdominal discomfort.  No chest pain.  She has had a slow healing lesion on her left leg.  She has pain in her knee.  She has had significant trouble with anxiety regarding the lesion on her leg and the pain in her knee.  She has had mild cognitive impairment.  Past Medical History:  Diagnosis Date  . Cancer [facility]) March 2016   Skin Cancer :  Right Cheek     SCC  . Cancer [facility]) 2013   Skin Cancer  :  Anterior Neck   SCC  . Carotid artery occlusion   . Chronic kidney disease   . Diverticulitis   . DVT (deep venous thrombosis) ([place]) 07/14/10   LEA doppler   . GI bleeding    2013/2016/2018: suspected diverticular bleeding  . Glaucoma   . Hearing loss   . HTN (hypertension)    cholesterol  . Lumbar spine pain   . Lymphedema   . Macular degeneration   . Pain in joints   . Precancerous changes of the cervix    lesions  . Rectocele   . SOB (shortness of breath) 04/04/2010   2D echo EF=>55%   Past Surgical History:  Procedure Laterality Date  . ABDOMINAL HYSTERECTOMY    . appendix    . bladder tac    . COLONOSCOPY  09/20/2011   Dr. Carol Ada: pandiverticulosis, hemorrhoids. ascending colon polyp not removed in setting of active bleeding.  Marland Kitchen  COLONOSCOPY N/A 04/10/2017   Dr. [name]: three 4-6 mm polyps in ascending colon and cecum, s/p removal. tubular adenomas. diverticulosis in entire examined colon.   . corneal erosion     cataracts   . elevated metatarsal arch    . fallen uterus    . hemmorrhoidectomy    . left total knee replacement  05/02/04   [name]  . met osteostomy    . morton's nueroma    . OVARIAN CYST SURGERY    . RECTOCELE REPAIR    . rupture rt groin    . salk    . SKIN CANCER EXCISION Right March 2016  2013   Cheek -  [facility]  and  Anterior Neck  . tracheotomy          Family History  Problem Relation Age of Onset  . Stroke Mother   . Diabetes Mother   . Heart attack Father   . COPD Paternal Grandfather   . Dementia Sister   . Colon cancer Neg Hx     Social History:  reports that  has never smoked. she has never used smokeless tobacco. She reports that she does not drink alcohol or use drugs.   Allergies:  Allergies  Allergen Reactions  .  Promethazine Hcl Nausea And Vomiting and Other (See Comments)    REACTION:Confusion  . Codeine Nausea And Vomiting  . Tramadol Hcl Nausea And Vomiting and Other (See Comments)    Motion Sickness  . Hydrocodone Other (See Comments)    Unknown Reaction     Medications Prior to Admission  Medication Sig Dispense Refill  . acetaminophen (TYLENOL) 500 MG tablet Take 500 mg by mouth every 6 (six) hours as needed (pain).     . Calcium 600-200 MG-UNIT per tablet Take 1 tablet by mouth daily.      . dorzolamide-timolol (COSOPT) 22.3-6.8 MG/ML ophthalmic solution Place 1 drop into both eyes 2 (two) times daily.      Marland Kitchen losartan (COZAAR) 50 MG tablet Take 50 mg by mouth daily.     Marland Kitchen LUMIGAN 0.01 % SOLN Place 1 drop into both eyes at bedtime.     . Melatonin 5 MG TABS Take by mouth at bedtime.    . Multiple Vitamins-Minerals (PRESERVISION AREDS 2 PO) Take 2 tablets by mouth every morning. One tablet daily     . oxybutynin (DITROPAN-XL) 10 MG 24 hr tablet Take 10  mg by mouth daily.     . polyethylene glycol (MIRALAX / GLYCOLAX) packet Take 17 g by mouth daily.    . traMADol (ULTRAM) 50 MG tablet Take 1 tablet (50 mg total) by mouth every 6 (six) hours as needed. 60 tablet 1  . [DISCONTINUED] meloxicam (MOBIC) 7.5 MG tablet Take 7.5 mg by mouth daily.    . [DISCONTINUED] Plant Sterols and Stanols (CHOLEST OFF PO) Take 1 tablet by mouth daily.         FTD:DUKGU from the symptoms mentioned above,there are no other symptoms referable to all systems reviewed.  10 point review of systems performed and except as mentioned is negative  Physical Exam: Blood pressure (!) 119/53, pulse 65, temperature 98.3 F (36.8 C), temperature source Oral, resp. rate 18, height '5\' 2"'  (1.575 m), weight 55.8 kg (123 lb 0.3 oz), SpO2 97 %. Constitutional: She is awake and alert and in no acute distress.  She is anxious.  Eyes: Pupils react ears nose mouth and throat: Her mucous membranes are mildly dry.  Throat is clear.  She is very hard of hearing.  Cardiovascular: Her heart is regular with soft systolic murmur.  No gallop.  Respiratory: Her respiratory effort is normal.  Her lungs are clear.  Gastrointestinal: She is mildly distended in the abdomen.  Minimal tenderness.  Stool in the office was green and negative for blood rectal examination did not show any masses.  Skin: Warm and dry.  The skin lesion on her left shin is scabbed over and slowly healing musculoskeletal: Normal strength.  Neurological: Grossly intact psychiatric: She is very anxious  Recent Labs    08/28/17 1729 08/29/17 0419  WBC 6.4 6.5  NEUTROABS 3.7  --   HGB 11.0* 10.5*  HCT 35.3* 33.6*  MCV 101.4* 101.2*  PLT 223 204   Recent Labs    08/28/17 1729 08/29/17 0419  NA 140 140  K 4.6 4.2  CL 106 109  CO2 24 22  GLUCOSE 137* 90  BUN 29* 23*  CREATININE 1.36* 1.33*  CALCIUM 9.2 8.6*  lablast2(ast:2,ALT:2,alkphos:2,bilitot:2,prot:2,albumin:2)@    No results found for this or any previous  visit (from the past 240 hour(s)).   Ct Abdomen Pelvis W Contrast  Result Date: 08/28/2017 CLINICAL DATA:  Left lower quadrant pain and abdominal distention x1 week. Abdominal bloating  and dark stools. Patient has had 2 episodes of gastrointestinal bleeding. EXAM: CT ABDOMEN AND PELVIS WITH CONTRAST TECHNIQUE: Multidetector CT imaging of the abdomen and pelvis was performed using the standard protocol following bolus administration of intravenous contrast. CONTRAST:  60m ISOVUE-300 IOPAMIDOL (ISOVUE-300) INJECTION 61% COMPARISON:  09/08/2014 FINDINGS: Lower chest: Coronary arteriosclerosis is noted along the LAD and left circumflex. Heart is borderline enlarged without pericardial effusion. Mild aortic atherosclerosis of the descending thoracic aorta. Small granuloma is seen in the right middle lobe. There is passive atelectasis at the lung bases. Hepatobiliary: Dependent gallstones without secondary signs of acute cholecystitis. No enhancing mass lesions of the liver. Pancreas: Atrophic pancreas without inflammation or ductal dilatation. No mass. Spleen: Normal Adrenals/Urinary Tract: Normal bilateral adrenal glands. Mild bilateral renal cortical thinning without mass or nephrolithiasis. No hydroureteronephrosis. The urinary bladder is distended without focal mural thickening or calculus. Stomach/Bowel: Small hiatal hernia. The stomach is decompressed in appearance. Small intraluminal hypodensities are seen within the stomach likely representing ingested food material or pills. Small polyps are not entirely excluded. These measure up to 6 mm. Normal small bowel rotation is identified without bowel obstruction or inflammation. The distal and terminal ileum are normal. Patient is status post appendectomy. Circular muscle hypertrophy with extensive descending and sigmoid diverticulosis is noted. Vascular/Lymphatic: Mild aortoiliac atherosclerosis. No aneurysm or adenopathy. Reproductive: Status post hysterectomy.   No adnexal mass. Other: Small fat containing hernias bilaterally. Musculoskeletal: Degenerative disc disease L3-4 and L5-S1. No aggressive osseous lesions nor fracture. IMPRESSION: 1. Extensive left-sided colonic diverticulosis with circular muscle hypertrophy. 2. No acute bowel inflammation or obstruction. 3. Coronary arteriosclerosis along the LAD and left circumflex. 4. Uncomplicated cholelithiasis. 5. Soft tissue densities within the stomach likely represent ingested food or pills. Small polyps are not entirely excluded. 6. Degenerative disc disease L3-4 and L5-S1. Electronically Signed   By: DAshley RoyaltyM.D.   On: 08/28/2017 23:17   Impression: She has abdominal discomfort.  She has previous history of GI bleeding requiring blood transfusion.  She reports having had black stools.  She had been on a anti-inflammatory.  She is known to have significant diverticulosis Active Problems:   Abdominal pain     Plan: Admit for observation.  CT of the abdomen and pelvis.  Check hemoglobin serially.      Ameli Sangiovanni L   08/29/2017, 8:20 AM

## 2017-08-30 ENCOUNTER — Telehealth: Payer: Self-pay | Admitting: Orthopedic Surgery

## 2017-08-30 NOTE — Telephone Encounter (Signed)
RECOMMEND GO TO ER

## 2017-08-30 NOTE — Telephone Encounter (Signed)
I called her to advise. Dr [name] has asked her to give the injection more time to work, she has asked for another. I have told her to give this more time and told her it is too soon for another injection (she had IM depomedrol). He has had me advise her to go to the Emergency room if her pain is worsening.

## 2017-08-30 NOTE — Telephone Encounter (Signed)
Patient called this morning 10:05am to relay that she cannot put weight onto left leg. She was seen for office visit and had injection 3 days ago, 08/27/17. She states she's got to have some help, and wants to come here to office this morning "and sit and wait". I relayed she is to await a call back prior to coming to office without an appointment. Also relayed she may need to contact her primary care doctor as well, which she does not wish to do. Please advise.

## 2017-08-30 NOTE — Telephone Encounter (Signed)
I did advise her, she voiced understanding

## 2017-09-02 DIAGNOSIS — L851 Acquired keratosis [keratoderma] palmaris et plantaris: Secondary | ICD-10-CM | POA: Diagnosis not present

## 2017-09-02 DIAGNOSIS — M79674 Pain in right toe(s): Secondary | ICD-10-CM | POA: Diagnosis not present

## 2017-09-02 DIAGNOSIS — M79675 Pain in left toe(s): Secondary | ICD-10-CM | POA: Diagnosis not present

## 2017-09-02 DIAGNOSIS — B351 Tinea unguium: Secondary | ICD-10-CM | POA: Diagnosis not present

## 2017-09-02 DIAGNOSIS — I739 Peripheral vascular disease, unspecified: Secondary | ICD-10-CM | POA: Diagnosis not present

## 2017-09-10 DIAGNOSIS — M545 Low back pain: Secondary | ICD-10-CM | POA: Diagnosis not present

## 2017-09-10 DIAGNOSIS — M542 Cervicalgia: Secondary | ICD-10-CM | POA: Diagnosis not present

## 2017-09-10 DIAGNOSIS — M9903 Segmental and somatic dysfunction of lumbar region: Secondary | ICD-10-CM | POA: Diagnosis not present

## 2017-09-10 DIAGNOSIS — M9901 Segmental and somatic dysfunction of cervical region: Secondary | ICD-10-CM | POA: Diagnosis not present

## 2017-09-10 DIAGNOSIS — M546 Pain in thoracic spine: Secondary | ICD-10-CM | POA: Diagnosis not present

## 2017-09-10 DIAGNOSIS — M9902 Segmental and somatic dysfunction of thoracic region: Secondary | ICD-10-CM | POA: Diagnosis not present

## 2017-09-18 DIAGNOSIS — M546 Pain in thoracic spine: Secondary | ICD-10-CM | POA: Diagnosis not present

## 2017-09-18 DIAGNOSIS — M545 Low back pain: Secondary | ICD-10-CM | POA: Diagnosis not present

## 2017-09-18 DIAGNOSIS — M9903 Segmental and somatic dysfunction of lumbar region: Secondary | ICD-10-CM | POA: Diagnosis not present

## 2017-09-18 DIAGNOSIS — M9901 Segmental and somatic dysfunction of cervical region: Secondary | ICD-10-CM | POA: Diagnosis not present

## 2017-09-18 DIAGNOSIS — M542 Cervicalgia: Secondary | ICD-10-CM | POA: Diagnosis not present

## 2017-09-18 DIAGNOSIS — M9902 Segmental and somatic dysfunction of thoracic region: Secondary | ICD-10-CM | POA: Diagnosis not present

## 2017-09-24 DIAGNOSIS — R1084 Generalized abdominal pain: Secondary | ICD-10-CM | POA: Diagnosis not present

## 2017-09-24 DIAGNOSIS — I129 Hypertensive chronic kidney disease with stage 1 through stage 4 chronic kidney disease, or unspecified chronic kidney disease: Secondary | ICD-10-CM | POA: Diagnosis not present

## 2017-09-24 DIAGNOSIS — N183 Chronic kidney disease, stage 3 (moderate): Secondary | ICD-10-CM | POA: Diagnosis not present

## 2017-09-24 DIAGNOSIS — E785 Hyperlipidemia, unspecified: Secondary | ICD-10-CM | POA: Diagnosis not present

## 2017-10-01 ENCOUNTER — Encounter: Payer: Self-pay | Admitting: Nurse Practitioner

## 2017-10-01 ENCOUNTER — Ambulatory Visit (INDEPENDENT_AMBULATORY_CARE_PROVIDER_SITE_OTHER): Payer: Medicare Other | Admitting: Nurse Practitioner

## 2017-10-01 VITALS — BP 137/70 | HR 62 | Temp 97.2°F | Ht 63.0 in | Wt 124.8 lb

## 2017-10-01 DIAGNOSIS — K921 Melena: Secondary | ICD-10-CM | POA: Diagnosis not present

## 2017-10-01 DIAGNOSIS — D62 Acute posthemorrhagic anemia: Secondary | ICD-10-CM | POA: Diagnosis not present

## 2017-10-01 NOTE — Progress Notes (Signed)
cc'ed to pcp °

## 2017-10-01 NOTE — Assessment & Plan Note (Signed)
Denies hematochezia or melena.  Her last labs look good with normalization of hemoglobin.  I offered to recheck CBC one more time to ensure she is not losing any more blood.  She declines any further lab work related to this.  Recommend she follow-up as needed, as she is not sure she needs to continue to be seen.

## 2017-10-01 NOTE — Patient Instructions (Signed)
1. You taking your current medications. 2. Call us if you have any problems or need an appointment. 3. Call us if you have any questions.    It was great to see you today!  Have a wonderful Spring and Summer!!!    At [facility] we value your feedback. You may receive a survey about your visit today. Please share your experience as we strive to create trusting relationships with our patients to provide genuine, compassionate, quality care.

## 2017-10-01 NOTE — Assessment & Plan Note (Signed)
Anemia look good 3 months ago with a hemoglobin at normalized to 11.2.  She is declining further lab work.  She is not wanting to continue to follow-up at regular intervals, even rare intervals such as once every 1-2 years.  Recommend she follow-up as needed.  Call if any further bleeding.

## 2017-10-01 NOTE — Progress Notes (Signed)
Referring Provider: Sinda Du, MD Primary Care Physician:  Sinda Du, MD Primary GI:  Dr. [name]  Chief Complaint  Patient presents with  . Follow-up    doing okay    HPI:   Cindy Schultz is a 84 y.o. female who presents for follow-up on anemia, GI bleed.  Patient was last seen in our office 06/28/2017 for the same as well as history of a sending colon polyp.  3 of intermittent bleeding, known history of diverticulosis.  Last colonoscopy 04/10/2017 which found diverticulosis in the entire colon, three 46 mm polyp in the ascending colon and cecum, otherwise normal.  Polyps found to be tubular adenoma.  Recommended no further colonoscopies unless new symptoms develop.  She did end up hospitalized in December 2018 for hematochezia with a hemoglobin of 10.4 in the ER which continued with bloody stools after admission.  Likely diverticular bleed versus ischemic colitis.  She required 1 unit of blood in her hemoglobin at discharge was 8.8.    At her last visit she was doing well overall, no further GI bleed.  No abdominal pain.  Overall felt fine.  Recommended follow-up lab testing, notify us if any new rectal bleeding, follow-up in 3 months.  Was completed 06/28/2017 with CBC now normalized hemoglobin at 11.3, iron normal, ferritin normal at 48.  Today she states she's doing ok. Has regular bowel movements. No further bleeding noted. Declines any further lab testing at this time, insists if she had rectal bleeding she would see it. Denies abdominal pain, N/V. GERD well managed, no breakthrough. Denies fever, chills, unintentional weight loss. Following a diet with no fried foods, seeds, corn; avoids ASA. Denies chest pain, dyspnea, dizziness, lightheadedness, syncope, near syncope. Denies any other upper or lower GI symptoms.  Not on PPI currently.  Past Medical History:  Diagnosis Date  . Cancer [facility] March 2016   Skin Cancer :  Right Cheek     SCC  . Cancer [facility]) 2013   Skin  Cancer  :  Anterior Neck   SCC  . Carotid artery occlusion   . Chronic kidney disease   . Diverticulitis   . DVT (deep venous thrombosis) ([place]) 07/14/10   LEA doppler   . GI bleeding    2013/2016/2018: suspected diverticular bleeding  . Glaucoma   . Hearing loss   . HTN (hypertension)    cholesterol  . Lumbar spine pain   . Lymphedema   . Macular degeneration   . Pain in joints   . Precancerous changes of the cervix    lesions  . Rectocele   . SOB (shortness of breath) 04/04/2010   2D echo EF=>55%    Past Surgical History:  Procedure Laterality Date  . ABDOMINAL HYSTERECTOMY    . appendix    . bladder tac    . COLONOSCOPY  09/20/2011   Dr. Carol Ada: pandiverticulosis, hemorrhoids. ascending colon polyp not removed in setting of active bleeding.  Marland Kitchen COLONOSCOPY N/A 04/10/2017   Dr. [name]: three 4-6 mm polyps in ascending colon and cecum, s/p removal. tubular adenomas. diverticulosis in entire examined colon.   . corneal erosion     cataracts   . elevated metatarsal arch    . fallen uterus    . hemmorrhoidectomy    . left total knee replacement  05/02/04   [name]  . met osteostomy    . morton's nueroma    . OVARIAN CYST SURGERY    . RECTOCELE REPAIR    .  rupture rt groin    . salk    . SKIN CANCER EXCISION Right March 2016  2013   Cheek -  [facility]  and  Anterior Neck  . tracheotomy      Current Outpatient Medications  Medication Sig Dispense Refill  . acetaminophen (TYLENOL) 500 MG tablet Take 500 mg by mouth every 6 (six) hours as needed (pain).     . Calcium 600-200 MG-UNIT per tablet Take 1 tablet by mouth daily.      . dorzolamide-timolol (COSOPT) 22.3-6.8 MG/ML ophthalmic solution Place 1 drop into both eyes 2 (two) times daily.      Marland Kitchen losartan (COZAAR) 50 MG tablet Take 50 mg by mouth daily.     Marland Kitchen LUMIGAN 0.01 % SOLN Place 1 drop into both eyes at bedtime.     . Melatonin 5 MG TABS Take by mouth at bedtime.    . Multiple Vitamins-Minerals (PRESERVISION  AREDS 2 PO) Take 2 tablets by mouth every morning. One tablet daily     . oxybutynin (DITROPAN-XL) 10 MG 24 hr tablet Take 10 mg by mouth daily.     . polyethylene glycol (MIRALAX / GLYCOLAX) packet Take 17 g by mouth daily.    . traMADol (ULTRAM) 50 MG tablet Take 1 tablet (50 mg total) by mouth every 6 (six) hours as needed. 60 tablet 1   No current facility-administered medications for this visit.     Allergies as of 10/01/2017 - Review Complete 10/01/2017  Allergen Reaction Noted  . Promethazine hcl Nausea And Vomiting and Other (See Comments)   . Codeine Nausea And Vomiting   . Tramadol hcl Nausea And Vomiting and Other (See Comments)   . Hydrocodone Other (See Comments)     Family History  Problem Relation Age of Onset  . Stroke Mother   . Diabetes Mother   . Heart attack Father   . COPD Paternal Grandfather   . Dementia Sister   . Colon cancer Neg Hx     Social History   Socioeconomic History  . Marital status: Divorced    Spouse name: Not on file  . Number of children: Not on file  . Years of education: Not on file  . Highest education level: Not on file  Occupational History  . Not on file  Social Needs  . Financial resource strain: Not on file  . Food insecurity:    Worry: Not on file    Inability: Not on file  . Transportation needs:    Medical: Not on file    Non-medical: Not on file  Tobacco Use  . Smoking status: Never Smoker  . Smokeless tobacco: Never Used  Substance and Sexual Activity  . Alcohol use: No  . Drug use: No  . Sexual activity: Never  Lifestyle  . Physical activity:    Days per week: Not on file    Minutes per session: Not on file  . Stress: Not on file  Relationships  . Social connections:    Talks on phone: Not on file    Gets together: Not on file    Attends religious service: Not on file    Active member of club or organization: Not on file    Attends meetings of clubs or organizations: Not on file    Relationship status:  Not on file  Other Topics Concern  . Not on file  Social History Narrative  . Not on file    Review of Systems: General: Negative for  anorexia, weight loss, fever, chills, fatigue, weakness. ENT: Negative for hoarseness, difficulty swallowing. CV: Negative for chest pain, angina, palpitations, peripheral edema.  Respiratory: Negative for dyspnea at rest, cough, sputum, wheezing.  GI: See history of present illness. Endo: Negative for unusual weight change.  Heme: Negative for bruising or bleeding.  Physical Exam: BP 137/70   Pulse 62   Temp (!) 97.2 F (36.2 C) (Oral)   Ht '5\' 3"'  (1.6 m)   Wt 124 lb 12.8 oz (56.6 kg)   BMI 22.11 kg/m  General:   Alert and oriented. Pleasant and cooperative. Well-nourished and well-developed.  Eyes:  Without icterus, sclera clear and conjunctiva pink.  Ears:  Hard of hearing. Cardiovascular:  S1, S2 present without murmurs appreciated. Extremities without clubbing or edema. Respiratory:  Clear to auscultation bilaterally. No wheezes, rales, or rhonchi. No distress.  Gastrointestinal:  +BS, soft, non-tender and non-distended. No HSM noted. No guarding or rebound. No masses appreciated.  Rectal:  Deferred  Musculoskalatal:  Symmetrical without gross deformities. Neurologic:  Alert and oriented x4;  grossly normal neurologically. Psych:  Alert and cooperative. Normal mood and affect. Heme/Lymph/Immune: No excessive bruising noted.    10/01/2017 10:59 AM   Disclaimer: This note was dictated with voice recognition software. Similar sounding words can inadvertently be transcribed and may not be corrected upon review.

## 2017-10-08 DIAGNOSIS — I10 Essential (primary) hypertension: Secondary | ICD-10-CM | POA: Diagnosis not present

## 2017-10-08 DIAGNOSIS — M25562 Pain in left knee: Secondary | ICD-10-CM | POA: Diagnosis not present

## 2017-10-08 DIAGNOSIS — M159 Polyosteoarthritis, unspecified: Secondary | ICD-10-CM | POA: Diagnosis not present

## 2017-10-19 ENCOUNTER — Other Ambulatory Visit: Payer: Self-pay | Admitting: Orthopedic Surgery

## 2017-10-21 ENCOUNTER — Ambulatory Visit (INDEPENDENT_AMBULATORY_CARE_PROVIDER_SITE_OTHER): Payer: Medicare Other | Admitting: Orthopedic Surgery

## 2017-10-21 VITALS — BP 99/59 | HR 58 | Ht 63.0 in | Wt 124.0 lb

## 2017-10-21 DIAGNOSIS — Z96652 Presence of left artificial knee joint: Secondary | ICD-10-CM

## 2017-10-21 DIAGNOSIS — M5442 Lumbago with sciatica, left side: Secondary | ICD-10-CM

## 2017-10-21 MED ORDER — TRAMADOL HCL 50 MG PO TABS: 50.0000 mg | ORAL_TABLET | Freq: Four times a day (QID) | ORAL | 5 refills | Status: DC | PRN

## 2017-10-21 MED ORDER — PREDNISONE 10 MG PO TABS: 10.0000 mg | ORAL_TABLET | Freq: Three times a day (TID) | ORAL | 0 refills | Status: DC

## 2017-10-21 MED ORDER — PROMETHAZINE HCL 12.5 MG PO TABS: 12.5000 mg | ORAL_TABLET | Freq: Four times a day (QID) | ORAL | 0 refills | Status: DC | PRN

## 2017-10-21 NOTE — Progress Notes (Signed)
Routine follow-up  Chief Complaint  Patient presents with  . Back Pain    Recheck on hip and back.    History 84 year old female comes in complaining of increasing lower back pain radiating to her left knee  She was given a IM injection of steroids last visit on 12 March which had varying amount of success.  Complains of left lateral leg pain left lower back pain  Review of systems bowel and bladder function remain intact  Physical exam BP (!) 99/59   Pulse (!) 58   Ht 5\' 3"  (1.6 m)   Wt 124 lb (56.2 kg)   BMI 21.97 kg/m   She is awake alert and oriented x3 appears to be relatively calm pleasant mood  She has normal neurovascular exam in her left lower extremity with negative straight leg raise but tenderness in the lower back and SI joint with painful range of motion in flexion extension and rotation  No weakness is noted in the left lower extremity  Encounter Diagnoses  Name Primary?  . History of left knee replacement   . Left-sided low back pain with left-sided sciatica, unspecified chronicity Yes   Recommend injection point of maximal tenderness left SI joint   Current Outpatient Medications:  .  acetaminophen (TYLENOL) 500 MG tablet, Take 500 mg by mouth every 6 (six) hours as needed (pain). , Disp: , Rfl:  .  Calcium 600-200 MG-UNIT per tablet, Take 1 tablet by mouth daily.  , Disp: , Rfl:  .  dorzolamide-timolol (COSOPT) 22.3-6.8 MG/ML ophthalmic solution, Place 1 drop into both eyes 2 (two) times daily.  , Disp: , Rfl:  .  losartan (COZAAR) 50 MG tablet, Take 50 mg by mouth daily. , Disp: , Rfl:  .  LUMIGAN 0.01 % SOLN, Place 1 drop into both eyes at bedtime. , Disp: , Rfl:  .  Melatonin 5 MG TABS, Take by mouth at bedtime., Disp: , Rfl:  .  Multiple Vitamins-Minerals (PRESERVISION AREDS 2 PO), Take 2 tablets by mouth every morning. One tablet daily , Disp: , Rfl:  .  oxybutynin (DITROPAN-XL) 10 MG 24 hr tablet, Take 10 mg by mouth daily. , Disp: , Rfl:  .   polyethylene glycol (MIRALAX / GLYCOLAX) packet, Take 17 g by mouth daily., Disp: , Rfl:  .  predniSONE (DELTASONE) 10 MG tablet, Take 1 tablet (10 mg total) by mouth 3 (three) times daily., Disp: 60 tablet, Rfl: 0 .  promethazine (PHENERGAN) 12.5 MG tablet, Take 1 tablet (12.5 mg total) by mouth every 6 (six) hours as needed for nausea or vomiting., Disp: 30 tablet, Rfl: 0 .  traMADol (ULTRAM) 50 MG tablet, Take 1 tablet (50 mg total) by mouth every 6 (six) hours as needed., Disp: 60 tablet, Rfl: 1 .  traMADol (ULTRAM) 50 MG tablet, Take 1 tablet (50 mg total) by mouth every 6 (six) hours as needed., Disp: 60 tablet, Rfl: 5  Meds ordered this encounter  Medications  . traMADol (ULTRAM) 50 MG tablet    Sig: Take 1 tablet (50 mg total) by mouth every 6 (six) hours as needed.    Dispense:  60 tablet    Refill:  5  . promethazine (PHENERGAN) 12.5 MG tablet    Sig: Take 1 tablet (12.5 mg total) by mouth every 6 (six) hours as needed for nausea or vomiting.    Dispense:  30 tablet    Refill:  0  . predniSONE (DELTASONE) 10 MG tablet    Sig:  Take 1 tablet (10 mg total) by mouth 3 (three) times daily.    Dispense:  60 tablet    Refill:  0    Follow-up as needed

## 2017-10-28 DIAGNOSIS — H401132 Primary open-angle glaucoma, bilateral, moderate stage: Secondary | ICD-10-CM | POA: Diagnosis not present

## 2017-11-07 DIAGNOSIS — N183 Chronic kidney disease, stage 3 (moderate): Secondary | ICD-10-CM | POA: Diagnosis not present

## 2017-11-07 DIAGNOSIS — D509 Iron deficiency anemia, unspecified: Secondary | ICD-10-CM | POA: Diagnosis not present

## 2017-11-07 DIAGNOSIS — R809 Proteinuria, unspecified: Secondary | ICD-10-CM | POA: Diagnosis not present

## 2017-11-07 DIAGNOSIS — E559 Vitamin D deficiency, unspecified: Secondary | ICD-10-CM | POA: Diagnosis not present

## 2017-11-07 DIAGNOSIS — I10 Essential (primary) hypertension: Secondary | ICD-10-CM | POA: Diagnosis not present

## 2017-11-07 DIAGNOSIS — Z79899 Other long term (current) drug therapy: Secondary | ICD-10-CM | POA: Diagnosis not present

## 2017-11-13 ENCOUNTER — Other Ambulatory Visit: Payer: Self-pay | Admitting: Orthopedic Surgery

## 2017-11-13 DIAGNOSIS — R809 Proteinuria, unspecified: Secondary | ICD-10-CM | POA: Diagnosis not present

## 2017-11-13 DIAGNOSIS — N184 Chronic kidney disease, stage 4 (severe): Secondary | ICD-10-CM | POA: Diagnosis not present

## 2017-11-13 DIAGNOSIS — I10 Essential (primary) hypertension: Secondary | ICD-10-CM | POA: Diagnosis not present

## 2017-11-13 DIAGNOSIS — D649 Anemia, unspecified: Secondary | ICD-10-CM | POA: Diagnosis not present

## 2017-11-13 NOTE — Telephone Encounter (Signed)
Patient also came to office asking if she may have a refill - per interface prescription request: predniSONE (DELTASONE) 10 MG tablet 60 tablet  ([facility])

## 2017-11-13 NOTE — Telephone Encounter (Signed)
This is duplicate request, I have already routed it to Dr [name], why are they being duplicated?  this is the 2nd one

## 2017-12-04 ENCOUNTER — Telehealth: Payer: Self-pay | Admitting: Orthopedic Surgery

## 2017-12-04 DIAGNOSIS — M545 Low back pain: Secondary | ICD-10-CM | POA: Diagnosis not present

## 2017-12-04 DIAGNOSIS — G3184 Mild cognitive impairment, so stated: Secondary | ICD-10-CM | POA: Diagnosis not present

## 2017-12-04 DIAGNOSIS — I10 Essential (primary) hypertension: Secondary | ICD-10-CM | POA: Diagnosis not present

## 2017-12-04 DIAGNOSIS — F329 Major depressive disorder, single episode, unspecified: Secondary | ICD-10-CM | POA: Diagnosis not present

## 2017-12-04 NOTE — Telephone Encounter (Signed)
Ok to make appointment for next available, would not work in

## 2017-12-04 NOTE — Telephone Encounter (Signed)
Patient stopped in office just as closing for lunch requesting to "see Dr [name] right away for her back again."  I asked if she has been seen by her primary care doctor - states she has appointment there in 2 weeks but will call there also to request sooner appointment.  Any recommendations?

## 2017-12-04 NOTE — Telephone Encounter (Signed)
Called back to patient, left voice message offering appointment.

## 2017-12-09 ENCOUNTER — Ambulatory Visit (INDEPENDENT_AMBULATORY_CARE_PROVIDER_SITE_OTHER): Payer: Medicare Other | Admitting: Orthopedic Surgery

## 2017-12-09 ENCOUNTER — Encounter: Payer: Self-pay | Admitting: Orthopedic Surgery

## 2017-12-09 VITALS — BP 133/82 | HR 84 | Ht 63.0 in | Wt 123.0 lb

## 2017-12-09 DIAGNOSIS — M5440 Lumbago with sciatica, unspecified side: Secondary | ICD-10-CM

## 2017-12-09 MED ORDER — GABAPENTIN 100 MG PO CAPS: 100.0000 mg | ORAL_CAPSULE | Freq: Three times a day (TID) | ORAL | 2 refills | Status: DC

## 2017-12-09 NOTE — Progress Notes (Signed)
Routine follow-up  Chief Complaint  Patient presents with  . Back Pain    sacral pain    84 year old female with long-standing lower back pain recent exacerbation within the last 3 months we have treated her with steroid tapers x2 tramadol multiple injections intramuscular she complains of pain when she is getting up from a standing position and weakness getting up     Review of Systems  Constitutional: Negative for fever, malaise/fatigue and weight loss.  Musculoskeletal: Positive for back pain and joint pain.  Neurological: Positive for weakness. Negative for tingling and sensory change.   BP 133/82   Pulse 84   Ht 5\' 3"  (1.6 m)   Wt 123 lb (55.8 kg)   BMI 21.79 kg/m   She is awake alert and oriented she seems depressed  She is ambulatory with a cane with a shuffling gait in a flexed lumbosacral posture.  She is struggling.  She has positive bilateral straight leg raises at 30 to 70 degrees  Weakness none dorsiflexion plantarflexion  Range of motion both hips normal Lumbar spine tenderness painful extension relief with some flexion   Encounter Diagnosis  Name Primary?  . Midline low back pain with sciatica, sciatica laterality unspecified, unspecified chronicity Yes   Possible spinal stenosis as well  Continue tramadol prednisone and gabapentin follow-up after MRI  Meds ordered this encounter  Medications  . gabapentin (NEURONTIN) 100 MG capsule    Sig: Take 1 capsule (100 mg total) by mouth 3 (three) times daily.    Dispense:  90 capsule    Refill:  2

## 2017-12-09 NOTE — Patient Instructions (Addendum)
Go to the MRI scan at [facility] Friday June 28th 1pm arrive 12:30 for the scan.   Medications Tramadol  Prednisone Gabapentin new prescription

## 2017-12-12 ENCOUNTER — Emergency Department (HOSPITAL_COMMUNITY): Payer: Medicare Other

## 2017-12-12 ENCOUNTER — Encounter (HOSPITAL_COMMUNITY): Payer: Self-pay

## 2017-12-12 ENCOUNTER — Inpatient Hospital Stay (HOSPITAL_COMMUNITY)
Admission: EM | Admit: 2017-12-12 | Discharge: 2017-12-17 | DRG: 478 | Disposition: A | Discharge status: Skilled Nursing Facility | Payer: Medicare Other | Attending: Pulmonary Disease | Admitting: Pulmonary Disease

## 2017-12-12 DIAGNOSIS — M1712 Unilateral primary osteoarthritis, left knee: Secondary | ICD-10-CM | POA: Diagnosis present

## 2017-12-12 DIAGNOSIS — E860 Dehydration: Secondary | ICD-10-CM | POA: Diagnosis present

## 2017-12-12 DIAGNOSIS — W19XXXA Unspecified fall, initial encounter: Secondary | ICD-10-CM

## 2017-12-12 DIAGNOSIS — Z7952 Long term (current) use of systemic steroids: Secondary | ICD-10-CM

## 2017-12-12 DIAGNOSIS — S32038A Other fracture of third lumbar vertebra, initial encounter for closed fracture: Secondary | ICD-10-CM

## 2017-12-12 DIAGNOSIS — M5137 Other intervertebral disc degeneration, lumbosacral region: Secondary | ICD-10-CM | POA: Diagnosis present

## 2017-12-12 DIAGNOSIS — Z8249 Family history of ischemic heart disease and other diseases of the circulatory system: Secondary | ICD-10-CM

## 2017-12-12 DIAGNOSIS — M4856XA Collapsed vertebra, not elsewhere classified, lumbar region, initial encounter for fracture: Secondary | ICD-10-CM | POA: Diagnosis not present

## 2017-12-12 DIAGNOSIS — N184 Chronic kidney disease, stage 4 (severe): Secondary | ICD-10-CM | POA: Diagnosis present

## 2017-12-12 DIAGNOSIS — I129 Hypertensive chronic kidney disease with stage 1 through stage 4 chronic kidney disease, or unspecified chronic kidney disease: Secondary | ICD-10-CM | POA: Diagnosis not present

## 2017-12-12 DIAGNOSIS — Z85828 Personal history of other malignant neoplasm of skin: Secondary | ICD-10-CM

## 2017-12-12 DIAGNOSIS — M5489 Other dorsalgia: Secondary | ICD-10-CM | POA: Diagnosis not present

## 2017-12-12 DIAGNOSIS — G3184 Mild cognitive impairment, so stated: Secondary | ICD-10-CM | POA: Diagnosis not present

## 2017-12-12 DIAGNOSIS — M545 Low back pain, unspecified: Secondary | ICD-10-CM

## 2017-12-12 DIAGNOSIS — Y92009 Unspecified place in unspecified non-institutional (private) residence as the place of occurrence of the external cause: Secondary | ICD-10-CM

## 2017-12-12 DIAGNOSIS — M549 Dorsalgia, unspecified: Secondary | ICD-10-CM | POA: Diagnosis present

## 2017-12-12 DIAGNOSIS — Z79899 Other long term (current) drug therapy: Secondary | ICD-10-CM

## 2017-12-12 DIAGNOSIS — H409 Unspecified glaucoma: Secondary | ICD-10-CM | POA: Diagnosis present

## 2017-12-12 DIAGNOSIS — M25559 Pain in unspecified hip: Secondary | ICD-10-CM | POA: Diagnosis not present

## 2017-12-12 DIAGNOSIS — G8929 Other chronic pain: Secondary | ICD-10-CM | POA: Diagnosis present

## 2017-12-12 DIAGNOSIS — Z885 Allergy status to narcotic agent status: Secondary | ICD-10-CM

## 2017-12-12 DIAGNOSIS — Z9181 History of falling: Secondary | ICD-10-CM

## 2017-12-12 DIAGNOSIS — M1611 Unilateral primary osteoarthritis, right hip: Secondary | ICD-10-CM | POA: Diagnosis present

## 2017-12-12 DIAGNOSIS — E869 Volume depletion, unspecified: Secondary | ICD-10-CM | POA: Diagnosis not present

## 2017-12-12 DIAGNOSIS — T148XXA Other injury of unspecified body region, initial encounter: Secondary | ICD-10-CM | POA: Diagnosis not present

## 2017-12-12 DIAGNOSIS — Z96652 Presence of left artificial knee joint: Secondary | ICD-10-CM | POA: Diagnosis present

## 2017-12-12 DIAGNOSIS — M25551 Pain in right hip: Secondary | ICD-10-CM | POA: Diagnosis not present

## 2017-12-12 DIAGNOSIS — H9190 Unspecified hearing loss, unspecified ear: Secondary | ICD-10-CM | POA: Diagnosis present

## 2017-12-12 DIAGNOSIS — S32009A Unspecified fracture of unspecified lumbar vertebra, initial encounter for closed fracture: Secondary | ICD-10-CM

## 2017-12-12 DIAGNOSIS — W010XXA Fall on same level from slipping, tripping and stumbling without subsequent striking against object, initial encounter: Secondary | ICD-10-CM | POA: Diagnosis present

## 2017-12-12 DIAGNOSIS — M48062 Spinal stenosis, lumbar region with neurogenic claudication: Secondary | ICD-10-CM | POA: Diagnosis present

## 2017-12-12 DIAGNOSIS — R262 Difficulty in walking, not elsewhere classified: Secondary | ICD-10-CM

## 2017-12-12 DIAGNOSIS — R269 Unspecified abnormalities of gait and mobility: Secondary | ICD-10-CM | POA: Diagnosis not present

## 2017-12-12 DIAGNOSIS — I10 Essential (primary) hypertension: Secondary | ICD-10-CM | POA: Diagnosis present

## 2017-12-12 DIAGNOSIS — M5440 Lumbago with sciatica, unspecified side: Secondary | ICD-10-CM

## 2017-12-12 DIAGNOSIS — S79911A Unspecified injury of right hip, initial encounter: Secondary | ICD-10-CM | POA: Diagnosis not present

## 2017-12-12 DIAGNOSIS — Z90710 Acquired absence of both cervix and uterus: Secondary | ICD-10-CM

## 2017-12-12 DIAGNOSIS — S32030K Wedge compression fracture of third lumbar vertebra, subsequent encounter for fracture with nonunion: Secondary | ICD-10-CM

## 2017-12-12 DIAGNOSIS — M5136 Other intervertebral disc degeneration, lumbar region: Secondary | ICD-10-CM | POA: Diagnosis present

## 2017-12-12 DIAGNOSIS — H3530 Unspecified macular degeneration: Secondary | ICD-10-CM | POA: Diagnosis present

## 2017-12-12 DIAGNOSIS — Z888 Allergy status to other drugs, medicaments and biological substances status: Secondary | ICD-10-CM

## 2017-12-12 DIAGNOSIS — M546 Pain in thoracic spine: Secondary | ICD-10-CM | POA: Diagnosis not present

## 2017-12-12 LAB — CBC WITH DIFFERENTIAL/PLATELET
Basophils Absolute: 0 10*3/uL (ref 0.0–0.1)
Basophils Relative: 0 %
Eosinophils Absolute: 0.1 10*3/uL (ref 0.0–0.7)
Eosinophils Relative: 1 %
HEMATOCRIT: 39.1 % (ref 36.0–46.0)
HEMOGLOBIN: 12.6 g/dL (ref 12.0–15.0)
LYMPHS PCT: 12 %
Lymphs Abs: 1.5 10*3/uL (ref 0.7–4.0)
MCH: 33.7 pg (ref 26.0–34.0)
MCHC: 32.2 g/dL (ref 30.0–36.0)
MCV: 104.5 fL — AB (ref 78.0–100.0)
MONOS PCT: 10 %
Monocytes Absolute: 1.3 10*3/uL (ref 0.1–1.0)
NEUTROS PCT: 77 %
Neutro Abs: 9.8 10*3/uL (ref 1.7–7.7)
Platelets: 188 10*3/uL (ref 150–400)
RBC: 3.74 MIL/uL — AB (ref 3.87–5.11)
RDW: 15.5 % (ref 11.5–15.5)
WBC: 12.6 10*3/uL — AB (ref 4.0–10.5)

## 2017-12-12 LAB — BASIC METABOLIC PANEL
Anion gap: 10 (ref 5–15)
BUN: 41 mg/dL — ABNORMAL HIGH (ref 8–23)
CO2: 26 mmol/L (ref 22–32)
Calcium: 9.5 mg/dL (ref 8.9–10.3)
Chloride: 105 mmol/L (ref 98–111)
Creatinine, Ser: 1.78 mg/dL — ABNORMAL HIGH (ref 0.44–1.00)
GFR calc non Af Amer: 24 mL/min — ABNORMAL LOW (ref >60)
GFR, EST AFRICAN AMERICAN: 28 mL/min — AB (ref >60)
Glucose, Bld: 84 mg/dL (ref 70–99)
POTASSIUM: 3.6 mmol/L (ref 3.5–5.1)
SODIUM: 141 mmol/L (ref 135–145)

## 2017-12-12 MED ORDER — LOSARTAN POTASSIUM 25 MG PO TABS
50.0000 mg | ORAL_TABLET | Freq: Every day | ORAL | Status: DC
  Administered 2017-12-13 – 2017-12-17 (×5): 50 mg via ORAL
  Filled 2017-12-12 (×6): qty 2

## 2017-12-12 MED ORDER — CALCIUM CARBONATE-VITAMIN D 500-200 MG-UNIT PO TABS
1.0000 | ORAL_TABLET | Freq: Every day | ORAL | Status: DC
  Administered 2017-12-13 – 2017-12-17 (×5): 1 via ORAL
  Filled 2017-12-12 (×6): qty 1

## 2017-12-12 MED ORDER — POLYETHYLENE GLYCOL 3350 17 G PO PACK
17.0000 g | PACK | Freq: Every day | ORAL | Status: DC
  Administered 2017-12-13 – 2017-12-16 (×4): 17 g via ORAL
  Filled 2017-12-12 (×4): qty 1

## 2017-12-12 MED ORDER — FENTANYL CITRATE (PF) 100 MCG/2ML IJ SOLN
25.0000 ug | Freq: Once | INTRAMUSCULAR | Status: AC
  Administered 2017-12-12: 25 ug via INTRAVENOUS
  Filled 2017-12-12: qty 2

## 2017-12-12 MED ORDER — DORZOLAMIDE HCL-TIMOLOL MAL 2-0.5 % OP SOLN
1.0000 [drp] | Freq: Two times a day (BID) | OPHTHALMIC | Status: DC
  Administered 2017-12-13 – 2017-12-16 (×8): 1 [drp] via OPHTHALMIC
  Filled 2017-12-12 (×3): qty 10

## 2017-12-12 MED ORDER — PREDNISONE 10 MG PO TABS
10.0000 mg | ORAL_TABLET | Freq: Three times a day (TID) | ORAL | Status: DC
  Administered 2017-12-13: 10 mg via ORAL
  Filled 2017-12-12: qty 1

## 2017-12-12 MED ORDER — OCUVITE-LUTEIN PO CAPS
ORAL_CAPSULE | Freq: Every morning | ORAL | Status: DC
  Filled 2017-12-12: qty 1

## 2017-12-12 MED ORDER — PROMETHAZINE HCL 12.5 MG PO TABS: 12.5000 mg | ORAL_TABLET | Freq: Four times a day (QID) | ORAL | Status: DC | PRN

## 2017-12-12 MED ORDER — LATANOPROST 0.005 % OP SOLN
1.0000 [drp] | Freq: Every day | OPHTHALMIC | Status: DC
  Administered 2017-12-13 – 2017-12-15 (×4): 1 [drp] via OPHTHALMIC
  Filled 2017-12-12 (×2): qty 2.5

## 2017-12-12 MED ORDER — ACETAMINOPHEN 500 MG PO TABS: 500.0000 mg | ORAL_TABLET | Freq: Four times a day (QID) | ORAL | Status: DC | PRN

## 2017-12-12 MED ORDER — SODIUM CHLORIDE 0.9 % IV SOLN
INTRAVENOUS | Status: DC
  Administered 2017-12-13 – 2017-12-17 (×9): via INTRAVENOUS

## 2017-12-12 MED ORDER — OXYCODONE HCL ER 10 MG PO T12A
10.0000 mg | EXTENDED_RELEASE_TABLET | Freq: Two times a day (BID) | ORAL | Status: DC
  Administered 2017-12-13 – 2017-12-16 (×9): 10 mg via ORAL
  Filled 2017-12-12 (×11): qty 1

## 2017-12-12 MED ORDER — HEPARIN SODIUM (PORCINE) 5000 UNIT/ML IJ SOLN: 5000.0000 [IU] | Freq: Two times a day (BID) | INTRAMUSCULAR | Status: DC

## 2017-12-12 MED ORDER — HYDROCODONE-ACETAMINOPHEN 5-325 MG PO TABS: 1.0000 | ORAL_TABLET | ORAL | Status: DC | PRN

## 2017-12-12 MED ORDER — LIDOCAINE 5 % EX PTCH
1.0000 | MEDICATED_PATCH | CUTANEOUS | Status: DC
  Administered 2017-12-13 – 2017-12-16 (×5): 1 via TRANSDERMAL
  Filled 2017-12-12 (×6): qty 1

## 2017-12-12 MED ORDER — NIFEDIPINE ER OSMOTIC RELEASE 30 MG PO TB24
30.0000 mg | ORAL_TABLET | Freq: Every day | ORAL | Status: DC
  Administered 2017-12-13 – 2017-12-17 (×5): 30 mg via ORAL
  Filled 2017-12-12 (×6): qty 1

## 2017-12-12 MED ORDER — GABAPENTIN 100 MG PO CAPS
100.0000 mg | ORAL_CAPSULE | Freq: Three times a day (TID) | ORAL | Status: DC
  Administered 2017-12-13: 100 mg via ORAL
  Filled 2017-12-12: qty 1

## 2017-12-12 MED ORDER — OXYBUTYNIN CHLORIDE ER 5 MG PO TB24
10.0000 mg | ORAL_TABLET | Freq: Every day | ORAL | Status: DC
  Administered 2017-12-13 – 2017-12-17 (×5): 10 mg via ORAL
  Filled 2017-12-12 (×5): qty 2
  Filled 2017-12-12: qty 1
  Filled 2017-12-12: qty 2

## 2017-12-12 MED ORDER — MELATONIN 3 MG PO TABS
5.0000 mg | ORAL_TABLET | Freq: Every day | ORAL | Status: DC
  Administered 2017-12-13: 4.5 mg via ORAL
  Filled 2017-12-12: qty 1.5

## 2017-12-12 MED ORDER — ONDANSETRON HCL 4 MG/2ML IJ SOLN
4.0000 mg | Freq: Once | INTRAMUSCULAR | Status: AC
  Administered 2017-12-12: 4 mg via INTRAVENOUS
  Filled 2017-12-12: qty 2

## 2017-12-12 NOTE — ED Notes (Signed)
Checked with pt for urine sample,pt tried no luck will check back in 30 minutes.

## 2017-12-12 NOTE — ED Notes (Signed)
Assisted pt to get out of bed, pt screaming out c/o lower back pain when getting out of bed.  Pt took a few steps in the room and did not c/o pain during ambulating, edp notified.

## 2017-12-12 NOTE — ED Triage Notes (Signed)
Pt fell at home, was found in the doorway of her home halfway in and out the door.  Pt c/o lower back pain and right hip.  Pt also has previous lower back pain prior to today.  Pt denies hitting head, denies neck pain, is awake, alert, oriented

## 2017-12-12 NOTE — H&P (Signed)
History and Physical  Cindy Schultz TDS:[id number] DOB: 12-04-1923 DOA: 12/12/2017  Referring physician: ER Physician, Dr. Gilford Raid. PCP: Sinda Du, MD  Outpatient Specialists: Orthopedic Surgeon Patient coming from: Home  Chief Complaint: Fall and back pain.  HPI:  Patient is a 84 year old Caucasian female, lives alone at home, with past medical history significant for low back pain that is currently being worked up by the orthopedic team.  Patient has been on oral prednisone, gabapentin and tramadol for the low back pain.  Other past medical history is as documented below.  Orthopedic team had originally scheduled MRI of the lumbar region for tomorrow.  Unfortunately, the patient fell at home today.  According to the patient, she was leaving the house to go get money from the bank, as well as pick up a medication.  Patient could not confirm if this was a mechanical fall, or if patient passed out and fell.  The patient cannot tell me how long for she was down, but patient activated the life alert.  On presentation to the hospital, the x-ray done on revealing.  MRI is pending.  Lab work is also pending.  No headache, no neck pain, no fever chills, no shortness of breath, no chest pain, no URI symptoms, no GI symptoms and no urinary symptoms.  Hospitalist team has been asked to admit patient for further assessment and management.  The patient pain is so severe that the patient is not able to ambulate at the moment.  ED Course: Patient has received pain medication.  Patient is also been hydrated. Pertinent labs: Pending. Imaging: independently reviewed.   Review of Systems:  10 systems were reviewed.  Pertinent positives as in the history of presenting illness. Negative for fever, visual changes, sore throat, rash, chest pain, SOB, dysuria, bleeding, n/v/abdominal pain.  Past Medical History:  Diagnosis Date  . Cancer [facility]) March 2016   Skin Cancer :  Right Cheek     SCC  . Cancer  [facility]) 2013   Skin Cancer  :  Anterior Neck   SCC  . Carotid artery occlusion   . Chronic kidney disease   . Diverticulitis   . DVT (deep venous thrombosis) ([place]) 07/14/10   LEA doppler   . GI bleeding    2013/2016/2018: suspected diverticular bleeding  . Glaucoma   . Hearing loss   . HTN (hypertension)    cholesterol  . Lumbar spine pain   . Lymphedema   . Macular degeneration   . Pain in joints   . Precancerous changes of the cervix    lesions  . Rectocele   . SOB (shortness of breath) 04/04/2010   2D echo EF=>55%    Past Surgical History:  Procedure Laterality Date  . ABDOMINAL HYSTERECTOMY    . appendix    . bladder tac    . COLONOSCOPY  09/20/2011   Dr. Carol Ada: pandiverticulosis, hemorrhoids. ascending colon polyp not removed in setting of active bleeding.  Marland Kitchen COLONOSCOPY N/A 04/10/2017   Dr. [name]: three 4-6 mm polyps in ascending colon and cecum, s/p removal. tubular adenomas. diverticulosis in entire examined colon.   . corneal erosion     cataracts   . elevated metatarsal arch    . fallen uterus    . hemmorrhoidectomy    . left total knee replacement  05/02/04   [name]  . met osteostomy    . morton's nueroma    . OVARIAN CYST SURGERY    . RECTOCELE REPAIR    .  rupture rt groin    . salk    . SKIN CANCER EXCISION Right March 2016  2013   Cheek -  [facility]  and  Anterior Neck  . tracheotomy       reports that she has never smoked. She has never used smokeless tobacco. She reports that she does not drink alcohol or use drugs.  Allergies  Allergen Reactions  . Promethazine Hcl Nausea And Vomiting and Other (See Comments)    REACTION:Confusion  . Codeine Nausea And Vomiting  . Tramadol Hcl Nausea And Vomiting and Other (See Comments)    Motion Sickness  . Hydrocodone Other (See Comments)    Unknown Reaction     Family History  Problem Relation Age of Onset  . Stroke Mother   . Diabetes Mother   . Heart attack Father   . COPD Paternal Grandfather    . Dementia Sister   . Colon cancer Neg Hx      Prior to Admission medications   Medication Sig Start Date End Date Taking? Authorizing Provider  acetaminophen (TYLENOL) 500 MG tablet Take 500 mg by mouth every 6 (six) hours as needed (pain).     [provider]  Calcium 600-200 MG-UNIT per tablet Take 1 tablet by mouth daily.      [provider]  dorzolamide-timolol (COSOPT) 22.3-6.8 MG/ML ophthalmic solution Place 1 drop into both eyes 2 (two) times daily.      [provider]  gabapentin (NEURONTIN) 100 MG capsule Take 1 capsule (100 mg total) by mouth 3 (three) times daily. 12/09/17   Carole Civil, MD  losartan (COZAAR) 50 MG tablet Take 50 mg by mouth daily.  12/25/14   [provider]  LUMIGAN 0.01 % SOLN Place 1 drop into both eyes at bedtime.  03/06/17   [provider]  Melatonin 5 MG TABS Take by mouth at bedtime.    [provider]  Multiple Vitamins-Minerals (PRESERVISION AREDS 2 PO) Take 2 tablets by mouth every morning. One tablet daily     [provider]  NIFEdipine (PROCARDIA-XL/ADALAT-CC/NIFEDICAL-XL) 30 MG 24 hr tablet Take 30 mg by mouth daily. 11/24/17   [provider]  oxybutynin (DITROPAN-XL) 10 MG 24 hr tablet Take 10 mg by mouth daily.     [provider]  pantoprazole (PROTONIX) 40 MG tablet Take 40 mg by mouth daily. 11/25/17   [provider]  polyethylene glycol (MIRALAX / GLYCOLAX) packet Take 17 g by mouth daily.    [provider]  predniSONE (DELTASONE) 10 MG tablet TAKE 1 TABLET BY MOUTH THREE TIMES A DAY 11/13/17   Carole Civil, MD  promethazine (PHENERGAN) 12.5 MG tablet Take 1 tablet (12.5 mg total) by mouth every 6 (six) hours as needed for nausea or vomiting. 10/21/17   Carole Civil, MD  traMADol (ULTRAM) 50 MG tablet Take 1 tablet (50 mg total) by mouth every 6 (six) hours as needed. 07/19/17   Carole Civil, MD  traMADol (ULTRAM) 50 MG  tablet Take 1 tablet (50 mg total) by mouth every 6 (six) hours as needed. 10/21/17   Carole Civil, MD    Physical Exam: Vitals:   12/12/17 1933 12/12/17 2030 12/12/17 2137  BP: (!) 96/54 (!) 140/59 (!) 147/78  Pulse: 93 69 81  Resp: 20  19  Temp: 97.6 F (36.4 C)    TempSrc: Oral    SpO2: 97% 98% 98%     Constitutional:  . Appears  calm and comfortable Eyes:  Marland Kitchen Mild pallor. No jaundice.  ENMT:  . Dry buccal mucosa.  External ears, nose appear normal Neck:  . Neck is supple. No JVD Respiratory:  . CTA bilaterally, no w/r/r.  . Respiratory effort normal. No retractions or accessory muscle use Cardiovascular:  . S1S2, irregular . No LE extremity edema   Abdomen:  . Abdomen is soft and non tender. Organs are difficult to assess. Neurologic:  . Awake and alert. . Moves all limbs.  Wt Readings from Last 3 Encounters:  12/09/17 55.8 kg (123 lb)  10/21/17 56.2 kg (124 lb)  10/01/17 56.6 kg (124 lb 12.8 oz)    I have personally reviewed following labs and imaging studies  Labs on Admission:  CBC: No results for input(s): WBC, NEUTROABS, HGB, HCT, MCV, PLT in the last 168 hours. Basic Metabolic Panel: No results for input(s): NA, K, CL, CO2, GLUCOSE, BUN, CREATININE, CALCIUM, MG, PHOS in the last 168 hours. Liver Function Tests: No results for input(s): AST, ALT, ALKPHOS, BILITOT, PROT, ALBUMIN in the last 168 hours. No results for input(s): LIPASE, AMYLASE in the last 168 hours. No results for input(s): AMMONIA in the last 168 hours. Coagulation Profile: No results for input(s): INR, PROTIME in the last 168 hours. Cardiac Enzymes: No results for input(s): CKTOTAL, CKMB, CKMBINDEX, TROPONINI in the last 168 hours. BNP (last 3 results) No results for input(s): PROBNP in the last 8760 hours. HbA1C: No results for input(s): HGBA1C in the last 72 hours. CBG: No results for input(s): GLUCAP in the last 168 hours. Lipid Profile: No results for input(s): CHOL,  HDL, LDLCALC, TRIG, CHOLHDL, LDLDIRECT in the last 72 hours. Thyroid Function Tests: No results for input(s): TSH, T4TOTAL, FREET4, T3FREE, THYROIDAB in the last 72 hours. Anemia Panel: No results for input(s): VITAMINB12, FOLATE, FERRITIN, TIBC, IRON, RETICCTPCT in the last 72 hours. Urine analysis:    Component Value Date/Time   COLORURINE YELLOW 08/05/2009 [place] 08/05/2009 1533   LABSPEC 1.020 08/05/2009 1533   PHURINE 5.0 08/05/2009 1533   GLUCOSEU NEGATIVE 08/05/2009 1533   HGBUR NEGATIVE 08/05/2009 1533   BILIRUBINUR NEGATIVE 08/05/2009 1533   KETONESUR NEGATIVE 08/05/2009 1533   PROTEINUR NEGATIVE 08/05/2009 1533   UROBILINOGEN 0.2 08/05/2009 1533   NITRITE NEGATIVE 08/05/2009 1533   LEUKOCYTESUR  08/05/2009 1533    NEGATIVE MICROSCOPIC NOT DONE ON URINES WITH NEGATIVE PROTEIN, BLOOD, LEUKOCYTES, NITRITE, OR GLUCOSE <1000 mg/dL.   Sepsis Labs: _0 (procalcitonin:4,lacticidven:4) )No results found for this or any previous visit (from the past 240 hour(s)).    Radiological Exams on Admission: Dg Lumbar Spine Complete  Result Date: 12/12/2017 CLINICAL DATA:  Low back pain.  Unwitnessed fall. EXAM: LUMBAR SPINE - COMPLETE 4+ VIEW COMPARISON:  08/28/2017 CT abdomen/pelvis. FINDINGS: This report assumes 5 non rib-bearing lumbar vertebrae. Lumbar vertebral body heights are preserved, with no fracture. Moderate multilevel lumbar degenerative disc disease. No spondylolisthesis. Moderate bilateral lower lumbar facet arthropathy. No aggressive appearing focal osseous lesions. Abdominal aortic atherosclerosis. IMPRESSION: 1. No lumbar spine fracture or spondylolisthesis. 2. Moderate multilevel lumbar degenerative changes as detailed. Electronically Signed   By: Ilona Sorrel M.D.   On: 12/12/2017 20:33   Dg Hip Unilat W Or Wo Pelvis 2-3 Views Right  Result Date: 12/12/2017 CLINICAL DATA:  Right hip pain after unwitnessed fall at home. EXAM: DG HIP (WITH OR  WITHOUT PELVIS) 2-3V RIGHT COMPARISON:  05/25/2015 pelvis and right hip radiographs. FINDINGS: There is lower lumbar degenerative disc and  facet arthropathy from L4 through S1. No pelvic diastasis. Moderate left and marked right joint space narrowing with spurring is identified about both hips consistent with osteoarthritis. No acute displaced fracture. IMPRESSION: Osteoarthritis of both hips, right worse than left similar to prior. No acute fracture is identified. Rim of osteophytes are noted about the femoral head-neck junctions bilaterally and off the acetabula. Electronically Signed   By: Ashley Royalty M.D.   On: 12/12/2017 20:39    Active Problems:   Back pain   Assessment/Plan 1. Back pain. 2. Volume depletion. 3. Hypertension.   Admit patient.  Hydrate patient.  Optimize pain control.  Pursue MRI of the lumbar spine.  Consult orthopedic team.  Optimize blood pressure control.  Consult PT OT.  DVT prophylaxis: Subcutaneous heparin Code Status: Full Family Communication: Daughter Disposition Plan: Will depend on hospital course Consults called: Orthopedics will be consulted Admission status: Observation  Time spent: 62 minutes  Dana Allan, MD  [facility] Pager #: [phone] 7PM-7AM contact night coverage as above   12/12/2017, 9:43 PM

## 2017-12-12 NOTE — ED Provider Notes (Signed)
[facility] EMERGENCY DEPARTMENT Provider Note   CSN: [id number] Arrival date & time: 12/12/17  1925     History   Chief Complaint Chief Complaint  Patient presents with  . Fall    Hip pain    HPI Cindy Schultz is a 84 y.o. female.  Pt presents to the ED today with hip pain.  The pt tripped at home landing halfway in her door.  She c/o low back pain and right hip pain.  The pt denies hitting her head or having loc.  The pt said she only has pain with movement.  Pt has a hx of chronic back pain for which she's been taking tramadol at night.  The pt sees Dr. [name] for her back and has been on multiple prednisone packs without help.  He ordered a MRI which is scheduled for tomorrow.  Pt lives alone.  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     Past Medical History:  Diagnosis Date  . Cancer [facility]) March 2016   Skin Cancer :  Right Cheek     SCC  . Cancer [facility]) 2013   Skin Cancer  :  Anterior Neck   SCC  . Carotid artery occlusion   . Chronic kidney disease   . Diverticulitis   . DVT (deep venous thrombosis) ([place]) 07/14/10   LEA doppler   . GI bleeding    2013/2016/2018: suspected diverticular bleeding  . Glaucoma   . Hearing loss   . HTN (hypertension)    cholesterol  . Lumbar spine pain   . Lymphedema   . Macular degeneration   . Pain in joints   . Precancerous changes of the cervix    lesions  . Rectocele   . SOB (shortness of breath) 04/04/2010   2D echo EF=>55%    Patient Active Problem List   Diagnosis Date Noted  . Abdominal pain 08/28/2017  . Primary osteoarthritis of right hip 07/19/2017  . Tear of right rotator cuff 07/19/2017  . CKD (chronic kidney disease) stage 3, GFR 30-59 ml/min (HCC) 05/22/2017  . Macrocytosis   . Diverticulosis of large intestine with hemorrhage   . Rectal bleed 05/19/2017  . CKD (chronic kidney disease) stage 4, GFR 15-29 ml/min (HCC) 05/19/2017  . LLQ pain 03/08/2017  . Colon polyp 03/08/2017  . Constipation  03/08/2017  . Acute blood loss anemia 09/10/2014  . GIB (gastrointestinal bleeding) 09/08/2014  . Occlusion and stenosis of carotid artery without mention of cerebral infarction 12/14/2013  . Osteoarthritis of left knee 04/23/2013  . Thumb pain 10/22/2012  . De Quervain's syndrome (tenosynovitis) 10/22/2012  . [facility] DJD(carpometacarpal degenerative joint disease), localized primary 10/22/2012  . CTS (carpal tunnel syndrome) 10/22/2012  . Trigger thumb of right hand 10/22/2012  . Pes anserinus bursitis of right knee 06/26/2012  . OA (osteoarthritis) of knee 06/26/2012  . Leg pain, left 06/26/2012  . Radicular pain of left lower extremity 06/26/2012  . Diverticulosis of colon with hemorrhage 09/22/2011  . Syncopal episodes 09/20/2011  . GI bleed 09/19/2011  . HTN (hypertension) 09/19/2011  . Anemia due to blood loss, acute 09/19/2011  . Abnormality of gait 01/03/2011  . Personal history of fall 12/27/2010  . DISLOCATION CLOSED SHOULDER NEC 04/25/2010  . ANSERINE BURSITIS, LEFT 02/13/2010  . KNEE PAIN 12/22/2009  . LEG PAIN, BILATERAL 12/22/2009  . TOTAL KNEE FOLLOW-UP 12/22/2009  . HIP PAIN 02/07/2009  . LOW BACK PAIN 02/07/2009    Past Surgical History:  Procedure Laterality Date  . ABDOMINAL HYSTERECTOMY    . appendix    . bladder tac    . COLONOSCOPY  09/20/2011   Dr. Carol Ada: pandiverticulosis, hemorrhoids. ascending colon polyp not removed in setting of active bleeding.  Marland Kitchen COLONOSCOPY N/A 04/10/2017   Dr. [name]: three 4-6 mm polyps in ascending colon and cecum, s/p removal. tubular adenomas. diverticulosis in entire examined colon.   . corneal erosion     cataracts   . elevated metatarsal arch    . fallen uterus    . hemmorrhoidectomy    . left total knee replacement  05/02/04   [name]  . met osteostomy    . morton's nueroma    . OVARIAN CYST SURGERY    . RECTOCELE REPAIR    . rupture rt groin    . salk    . SKIN CANCER EXCISION Right March 2016  2013    Cheek -  [facility]  and  Anterior Neck  . tracheotomy       OB History   None      Home Medications    Prior to Admission medications   Medication Sig Start Date End Date Taking? Authorizing Provider  acetaminophen (TYLENOL) 500 MG tablet Take 500 mg by mouth every 6 (six) hours as needed (pain).     [provider]  Calcium 600-200 MG-UNIT per tablet Take 1 tablet by mouth daily.      [provider]  dorzolamide-timolol (COSOPT) 22.3-6.8 MG/ML ophthalmic solution Place 1 drop into both eyes 2 (two) times daily.      [provider]  gabapentin (NEURONTIN) 100 MG capsule Take 1 capsule (100 mg total) by mouth 3 (three) times daily. 12/09/17   Carole Civil, MD  losartan (COZAAR) 50 MG tablet Take 50 mg by mouth daily.  12/25/14   [provider]  LUMIGAN 0.01 % SOLN Place 1 drop into both eyes at bedtime.  03/06/17   [provider]  Melatonin 5 MG TABS Take by mouth at bedtime.    [provider]  Multiple Vitamins-Minerals (PRESERVISION AREDS 2 PO) Take 2 tablets by mouth every morning. One tablet daily     [provider]  NIFEdipine (PROCARDIA-XL/ADALAT-CC/NIFEDICAL-XL) 30 MG 24 hr tablet Take 30 mg by mouth daily. 11/24/17   [provider]  oxybutynin (DITROPAN-XL) 10 MG 24 hr tablet Take 10 mg by mouth daily.     [provider]  pantoprazole (PROTONIX) 40 MG tablet Take 40 mg by mouth daily. 11/25/17   [provider]  polyethylene glycol (MIRALAX / GLYCOLAX) packet Take 17 g by mouth daily.    [provider]  predniSONE (DELTASONE) 10 MG tablet TAKE 1 TABLET BY MOUTH THREE TIMES A DAY 11/13/17   Carole Civil, MD  promethazine (PHENERGAN) 12.5 MG tablet Take 1 tablet (12.5 mg total) by mouth every 6 (six) hours as needed for nausea or vomiting. 10/21/17   Carole Civil, MD  traMADol (ULTRAM) 50 MG tablet Take 1 tablet (50 mg total) by mouth every 6 (six) hours as needed. 07/19/17    Carole Civil, MD  traMADol (ULTRAM) 50 MG tablet Take 1 tablet (50 mg total) by mouth every 6 (six) hours as needed. 10/21/17   Carole Civil, MD    Family History Family History  Problem Relation Age of Onset  . Stroke Mother   . Diabetes Mother   . Heart attack Father   . COPD Paternal  Grandfather   . Dementia Sister   . Colon cancer Neg Hx     Social History Social History   Tobacco Use  . Smoking status: Never Smoker  . Smokeless tobacco: Never Used  Substance Use Topics  . Alcohol use: No  . Drug use: No     Allergies   Promethazine hcl; Codeine; Tramadol hcl; and Hydrocodone   Review of Systems Review of Systems  Musculoskeletal: Positive for back pain.       Right hip pain  All other systems reviewed and are negative.    Physical Exam Updated Vital Signs BP (!) 140/59   Pulse 69   Temp 97.6 F (36.4 C) (Oral)   Resp 20   SpO2 98%   Physical Exam  Constitutional: She is oriented to person, place, and time. She appears well-developed and well-nourished.  HENT:  Head: Normocephalic and atraumatic.  Right Ear: External ear normal.  Left Ear: External ear normal.  Nose: Nose normal.  Mouth/Throat: Oropharynx is clear and moist.  Eyes: Pupils are equal, round, and reactive to light. Conjunctivae and EOM are normal.  Neck: Normal range of motion. Neck supple.  Cardiovascular: Normal rate, regular rhythm, normal heart sounds and intact distal pulses.  Pulmonary/Chest: Effort normal and breath sounds normal.  Abdominal: Soft. Bowel sounds are normal.  Musculoskeletal:       Right hip: She exhibits tenderness.       Lumbar back: She exhibits tenderness.  Neurological: She is alert and oriented to person, place, and time.  Skin: Skin is warm. Capillary refill takes less than 2 seconds.  Abrasions to both lower legs.  Psychiatric: She has a normal mood and affect. Her behavior is normal. Judgment and thought content normal.  Nursing note and  vitals reviewed.    ED Treatments / Results  Labs (all labs ordered are listed, but only abnormal results are displayed) Labs Reviewed  BASIC METABOLIC PANEL  CBC WITH DIFFERENTIAL/PLATELET  URINALYSIS, ROUTINE W REFLEX MICROSCOPIC    EKG None  Radiology Dg Lumbar Spine Complete  Result Date: 12/12/2017 CLINICAL DATA:  Low back pain.  Unwitnessed fall. EXAM: LUMBAR SPINE - COMPLETE 4+ VIEW COMPARISON:  08/28/2017 CT abdomen/pelvis. FINDINGS: This report assumes 5 non rib-bearing lumbar vertebrae. Lumbar vertebral body heights are preserved, with no fracture. Moderate multilevel lumbar degenerative disc disease. No spondylolisthesis. Moderate bilateral lower lumbar facet arthropathy. No aggressive appearing focal osseous lesions. Abdominal aortic atherosclerosis. IMPRESSION: 1. No lumbar spine fracture or spondylolisthesis. 2. Moderate multilevel lumbar degenerative changes as detailed. Electronically Signed   By: Ilona Sorrel M.D.   On: 12/12/2017 20:33   Dg Hip Unilat W Or Wo Pelvis 2-3 Views Right  Result Date: 12/12/2017 CLINICAL DATA:  Right hip pain after unwitnessed fall at home. EXAM: DG HIP (WITH OR WITHOUT PELVIS) 2-3V RIGHT COMPARISON:  05/25/2015 pelvis and right hip radiographs. FINDINGS: There is lower lumbar degenerative disc and facet arthropathy from L4 through S1. No pelvic diastasis. Moderate left and marked right joint space narrowing with spurring is identified about both hips consistent with osteoarthritis. No acute displaced fracture. IMPRESSION: Osteoarthritis of both hips, right worse than left similar to prior. No acute fracture is identified. Rim of osteophytes are noted about the femoral head-neck junctions bilaterally and off the acetabula. Electronically Signed   By: Ashley Royalty M.D.   On: 12/12/2017 20:39    Procedures Procedures (including critical care time)  Medications Ordered in ED Medications  fentaNYL (SUBLIMAZE) injection 25 mcg (has  no  administration in time range)  ondansetron (ZOFRAN) injection 4 mg (has no administration in time range)     Initial Impression / Assessment and Plan / ED Course  I have reviewed the triage vital signs and the nursing notes.  Pertinent labs & imaging results that were available during my care of the patient were reviewed by me and considered in my medical decision making (see chart for details).  I think this fall has exacerbated her already painful low back.  She is unable to get up by herself and lives alone.   Pt d/w Dr. Marthenia Rolling ([facility]) who will admit for pain control.  We will order her MRI for tonight.  Final Clinical Impressions(s) / ED Diagnoses   Final diagnoses:  Acute midline low back pain without sciatica  Fall, initial encounter  Ambulatory dysfunction    ED Discharge Orders    None       Isla Pence, MD 12/12/17 2121

## 2017-12-13 ENCOUNTER — Other Ambulatory Visit: Payer: Self-pay | Admitting: Student

## 2017-12-13 ENCOUNTER — Other Ambulatory Visit: Payer: Self-pay

## 2017-12-13 ENCOUNTER — Observation Stay (HOSPITAL_COMMUNITY): Payer: Medicare Other

## 2017-12-13 ENCOUNTER — Ambulatory Visit (HOSPITAL_COMMUNITY): Admission: RE | Admit: 2017-12-13 | Payer: Medicare Other | Source: Ambulatory Visit

## 2017-12-13 DIAGNOSIS — I130 Hypertensive heart and chronic kidney disease with heart failure and stage 1 through stage 4 chronic kidney disease, or unspecified chronic kidney disease: Secondary | ICD-10-CM | POA: Diagnosis not present

## 2017-12-13 DIAGNOSIS — Z96652 Presence of left artificial knee joint: Secondary | ICD-10-CM | POA: Diagnosis present

## 2017-12-13 DIAGNOSIS — M545 Low back pain: Secondary | ICD-10-CM | POA: Diagnosis not present

## 2017-12-13 DIAGNOSIS — H44513 Absolute glaucoma, bilateral: Secondary | ICD-10-CM | POA: Diagnosis not present

## 2017-12-13 DIAGNOSIS — I10 Essential (primary) hypertension: Secondary | ICD-10-CM | POA: Diagnosis not present

## 2017-12-13 DIAGNOSIS — G3184 Mild cognitive impairment, so stated: Secondary | ICD-10-CM | POA: Diagnosis present

## 2017-12-13 DIAGNOSIS — F0391 Unspecified dementia with behavioral disturbance: Secondary | ICD-10-CM | POA: Diagnosis not present

## 2017-12-13 DIAGNOSIS — Z85828 Personal history of other malignant neoplasm of skin: Secondary | ICD-10-CM | POA: Diagnosis not present

## 2017-12-13 DIAGNOSIS — Z8249 Family history of ischemic heart disease and other diseases of the circulatory system: Secondary | ICD-10-CM | POA: Diagnosis not present

## 2017-12-13 DIAGNOSIS — M5137 Other intervertebral disc degeneration, lumbosacral region: Secondary | ICD-10-CM | POA: Diagnosis present

## 2017-12-13 DIAGNOSIS — R627 Adult failure to thrive: Secondary | ICD-10-CM | POA: Diagnosis not present

## 2017-12-13 DIAGNOSIS — W010XXA Fall on same level from slipping, tripping and stumbling without subsequent striking against object, initial encounter: Secondary | ICD-10-CM | POA: Diagnosis present

## 2017-12-13 DIAGNOSIS — G8929 Other chronic pain: Secondary | ICD-10-CM | POA: Diagnosis present

## 2017-12-13 DIAGNOSIS — M159 Polyosteoarthritis, unspecified: Secondary | ICD-10-CM | POA: Diagnosis not present

## 2017-12-13 DIAGNOSIS — M6281 Muscle weakness (generalized): Secondary | ICD-10-CM | POA: Diagnosis not present

## 2017-12-13 DIAGNOSIS — R262 Difficulty in walking, not elsewhere classified: Secondary | ICD-10-CM | POA: Diagnosis not present

## 2017-12-13 DIAGNOSIS — M4856XA Collapsed vertebra, not elsewhere classified, lumbar region, initial encounter for fracture: Secondary | ICD-10-CM | POA: Diagnosis present

## 2017-12-13 DIAGNOSIS — R2681 Unsteadiness on feet: Secondary | ICD-10-CM | POA: Diagnosis not present

## 2017-12-13 DIAGNOSIS — Z79899 Other long term (current) drug therapy: Secondary | ICD-10-CM | POA: Diagnosis not present

## 2017-12-13 DIAGNOSIS — L03116 Cellulitis of left lower limb: Secondary | ICD-10-CM | POA: Diagnosis not present

## 2017-12-13 DIAGNOSIS — Z7401 Bed confinement status: Secondary | ICD-10-CM | POA: Diagnosis not present

## 2017-12-13 DIAGNOSIS — M5136 Other intervertebral disc degeneration, lumbar region: Secondary | ICD-10-CM | POA: Diagnosis present

## 2017-12-13 DIAGNOSIS — Z7952 Long term (current) use of systemic steroids: Secondary | ICD-10-CM | POA: Diagnosis not present

## 2017-12-13 DIAGNOSIS — S32039A Unspecified fracture of third lumbar vertebra, initial encounter for closed fracture: Secondary | ICD-10-CM | POA: Diagnosis not present

## 2017-12-13 DIAGNOSIS — N184 Chronic kidney disease, stage 4 (severe): Secondary | ICD-10-CM | POA: Diagnosis present

## 2017-12-13 DIAGNOSIS — R1312 Dysphagia, oropharyngeal phase: Secondary | ICD-10-CM | POA: Diagnosis not present

## 2017-12-13 DIAGNOSIS — Y92009 Unspecified place in unspecified non-institutional (private) residence as the place of occurrence of the external cause: Secondary | ICD-10-CM | POA: Diagnosis not present

## 2017-12-13 DIAGNOSIS — M1712 Unilateral primary osteoarthritis, left knee: Secondary | ICD-10-CM | POA: Diagnosis present

## 2017-12-13 DIAGNOSIS — R41841 Cognitive communication deficit: Secondary | ICD-10-CM | POA: Diagnosis not present

## 2017-12-13 DIAGNOSIS — S22030A Wedge compression fracture of third thoracic vertebra, initial encounter for closed fracture: Secondary | ICD-10-CM | POA: Diagnosis not present

## 2017-12-13 DIAGNOSIS — I129 Hypertensive chronic kidney disease with stage 1 through stage 4 chronic kidney disease, or unspecified chronic kidney disease: Secondary | ICD-10-CM | POA: Diagnosis present

## 2017-12-13 DIAGNOSIS — Z90710 Acquired absence of both cervix and uterus: Secondary | ICD-10-CM | POA: Diagnosis not present

## 2017-12-13 DIAGNOSIS — H3530 Unspecified macular degeneration: Secondary | ICD-10-CM | POA: Diagnosis present

## 2017-12-13 DIAGNOSIS — Z9181 History of falling: Secondary | ICD-10-CM | POA: Diagnosis not present

## 2017-12-13 DIAGNOSIS — H409 Unspecified glaucoma: Secondary | ICD-10-CM | POA: Diagnosis present

## 2017-12-13 DIAGNOSIS — M48062 Spinal stenosis, lumbar region with neurogenic claudication: Secondary | ICD-10-CM | POA: Diagnosis present

## 2017-12-13 DIAGNOSIS — Z888 Allergy status to other drugs, medicaments and biological substances status: Secondary | ICD-10-CM | POA: Diagnosis not present

## 2017-12-13 DIAGNOSIS — E860 Dehydration: Secondary | ICD-10-CM | POA: Diagnosis present

## 2017-12-13 DIAGNOSIS — A4189 Other specified sepsis: Secondary | ICD-10-CM | POA: Diagnosis not present

## 2017-12-13 DIAGNOSIS — H9190 Unspecified hearing loss, unspecified ear: Secondary | ICD-10-CM | POA: Diagnosis present

## 2017-12-13 DIAGNOSIS — J189 Pneumonia, unspecified organism: Secondary | ICD-10-CM | POA: Diagnosis not present

## 2017-12-13 DIAGNOSIS — M810 Age-related osteoporosis without current pathological fracture: Secondary | ICD-10-CM | POA: Diagnosis not present

## 2017-12-13 DIAGNOSIS — S32039D Unspecified fracture of third lumbar vertebra, subsequent encounter for fracture with routine healing: Secondary | ICD-10-CM | POA: Diagnosis not present

## 2017-12-13 DIAGNOSIS — R279 Unspecified lack of coordination: Secondary | ICD-10-CM | POA: Diagnosis not present

## 2017-12-13 DIAGNOSIS — Z885 Allergy status to narcotic agent status: Secondary | ICD-10-CM | POA: Diagnosis not present

## 2017-12-13 LAB — MAGNESIUM: Magnesium: 2.1 mg/dL (ref 1.7–2.4)

## 2017-12-13 LAB — BASIC METABOLIC PANEL
Anion gap: 8 (ref 5–15)
BUN: 41 mg/dL — ABNORMAL HIGH (ref 8–23)
CO2: 25 mmol/L (ref 22–32)
Calcium: 8.7 mg/dL — ABNORMAL LOW (ref 8.9–10.3)
Chloride: 108 mmol/L (ref 98–111)
Creatinine, Ser: 1.6 mg/dL — ABNORMAL HIGH (ref 0.44–1.00)
GFR calc Af Amer: 31 mL/min — ABNORMAL LOW (ref >60)
GFR calc non Af Amer: 27 mL/min — ABNORMAL LOW (ref >60)
Glucose, Bld: 128 mg/dL — ABNORMAL HIGH (ref 70–99)
Potassium: 4.5 mmol/L (ref 3.5–5.1)
Sodium: 141 mmol/L (ref 135–145)

## 2017-12-13 LAB — PROTIME-INR
INR: 1.07
Prothrombin Time: 13.9 seconds (ref 11.4–15.2)

## 2017-12-13 LAB — CBC
HCT: 36.8 % (ref 36.0–46.0)
Hemoglobin: 11.7 g/dL — ABNORMAL LOW (ref 12.0–15.0)
MCH: 33.2 pg (ref 26.0–34.0)
MCHC: 31.8 g/dL (ref 30.0–36.0)
MCV: 104.5 fL — ABNORMAL HIGH (ref 78.0–100.0)
Platelets: 177 10*3/uL (ref 150–400)
RBC: 3.52 MIL/uL — ABNORMAL LOW (ref 3.87–5.11)
RDW: 15.6 % — ABNORMAL HIGH (ref 11.5–15.5)
WBC: 8.9 10*3/uL (ref 4.0–10.5)

## 2017-12-13 LAB — PHOSPHORUS: Phosphorus: 3.2 mg/dL (ref 2.5–4.6)

## 2017-12-13 MED ORDER — OCUVITE-LUTEIN PO CAPS
1.0000 | ORAL_CAPSULE | Freq: Every morning | ORAL | Status: DC
  Administered 2017-12-13 – 2017-12-17 (×5): 1 via ORAL
  Filled 2017-12-13 (×6): qty 1

## 2017-12-13 MED ORDER — GABAPENTIN 100 MG PO CAPS
200.0000 mg | ORAL_CAPSULE | Freq: Three times a day (TID) | ORAL | Status: DC
  Administered 2017-12-13 – 2017-12-17 (×13): 200 mg via ORAL
  Filled 2017-12-13 (×13): qty 2

## 2017-12-13 MED ORDER — METHYLPREDNISOLONE SODIUM SUCC 40 MG IJ SOLR
40.0000 mg | Freq: Four times a day (QID) | INTRAMUSCULAR | Status: DC
  Administered 2017-12-13 – 2017-12-17 (×16): 40 mg via INTRAVENOUS
  Filled 2017-12-13 (×18): qty 1

## 2017-12-13 MED ORDER — MELATONIN 3 MG PO TABS: 3.0000 mg | ORAL_TABLET | Freq: Every day | ORAL | Status: DC

## 2017-12-13 NOTE — Progress Notes (Signed)
Spoke with Val, RN to notify about transfer to and from [facility] via EMS 7/1 for possible IR procedure.  Patient planned, pending insurance approval, for possible image-guided lumbar 3 kyphoplasty/vertebroplasty 7/1 with Dr. Estanislado Pandy.  All questions answered and concerns addressed.  Bea Graff Triniti Gruetzmacher, PA-C 12/13/2017, 11:18 AM

## 2017-12-13 NOTE — Care Management Important Message (Signed)
Important Message  Patient Details  Name: Cindy Schultz MRN: [id number] Date of Birth: December 27, 1923   Medicare Important Message Given:  Yes    [name] 12/13/2017, 11:55 AM

## 2017-12-13 NOTE — Evaluation (Signed)
Occupational Therapy Evaluation Patient Details Name: Cindy Schultz MRN: [id number] DOB: Jun 07, 1924 Today's Date: 12/13/2017    History of Present Illness Patient is a 84 year old Caucasian female, lives alone at home, with past medical history significant for low back pain that is currently being worked up by the orthopedic team.  Patient has been on oral prednisone, gabapentin and tramadol for the low back pain.  Other past medical history is as documented below.  Orthopedic team had originally scheduled MRI of the lumbar region for tomorrow.  Unfortunately, the patient fell at home today.  According to the patient, she was leaving the house to go get money from the bank, as well as pick up a medication.  Patient could not confirm if this was a mechanical fall, or if patient passed out and fell.  The patient cannot tell me how long for she was down, but patient activated the life alert.  On presentation to the hospital, the x-ray done on revealing.  MRI is pending.  Lab work is also pending.  No headache, no neck pain, no fever chills, no shortness of breath, no chest pain, no URI symptoms, no GI symptoms and no urinary symptoms.  Hospitalist team has been asked to admit patient for further assessment and management.   Clinical Impression   Pt received upon return from MRI, agreeable to OT/PT co-evaluation. Pt reports no pain in supine or sitting, occasionally rubbing back during functional mobility. Pt requiring increased time and assistance for ADL completion, notably for LB tasks in sitting. Pt is unable to complete tasks in static standing due to pain and balance deficits. Pt requiring min assist for functional mobility tasks and toileting tasks. Pt will benefit from RW use for safety, PTA pt only using SPC. Recommend SNF on discharge to improve safety and independence in ADL completion, as well as improve strength and balance required for functional task completion. Pt is not safe to return home  alone at this time. Will continue to follow acutely.     Follow Up Recommendations  SNF;Supervision/Assistance - 24 hour    Equipment Recommendations  None recommended by OT       Precautions / Restrictions Precautions Precautions: Fall Restrictions Weight Bearing Restrictions: No      Mobility Bed Mobility               General bed mobility comments: Defer to PT note  Transfers                 General transfer comment: defer to PT note        ADL either performed or assessed with clinical judgement   ADL Overall ADL's : Needs assistance/impaired Eating/Feeding: Modified independent;Sitting   Grooming: Wash/dry hands;Set up;Sitting Grooming Details (indicate cue type and reason): Pt provided with hand sanitizer after toileting tasks. Pt able to donn earrings and hearing aids while seated             Lower Body Dressing: Minimal assistance;Sitting/lateral leans Lower Body Dressing Details (indicate cue type and reason): Pt requiring increased time to doff soiled undergarments and donn clean. Min assist for threading left foot and bringing over heel Toilet Transfer: Minimal assistance;Regular Toilet;Grab bars;RW   Toileting- Clothing Manipulation and Hygiene: Minimal assistance;Sitting/lateral lean         General ADL Comments: Pt requiring increased assistance with ADLs due to pain, weakness, and balance deficits     Vision Baseline Vision/History: Wears glasses Wears Glasses: At all times Patient Visual Report: No  change from baseline              Pertinent Vitals/Pain Pain Assessment: No/denies pain     Hand Dominance Right   Extremity/Trunk Assessment Upper Extremity Assessment Upper Extremity Assessment: Generalized weakness(grossly 4-/5 )   Lower Extremity Assessment Lower Extremity Assessment: Defer to PT evaluation       Communication Communication Communication: HOH(wears bilateral hearing aids)   Cognition  Arousal/Alertness: Awake/alert Behavior During Therapy: WFL for tasks assessed/performed Overall Cognitive Status: Within Functional Limits for tasks assessed                                                Home Living Family/patient expects to be discharged to:: Private residence Living Arrangements: Alone Available Help at Discharge: Family;Available PRN/intermittently Type of Home: House Home Access: Stairs to enter [employer] of Steps: 3 Entrance Stairs-Rails: Right;Left;Can reach both Home Layout: One level     Bathroom Shower/Tub: Occupational psychologist: Standard     Home Equipment: Environmental consultant - 2 wheels;Cane - single point;Bedside commode;Shower seat;Wheelchair - manual   Additional Comments: Pt only uses SPC      Prior Functioning/Environment Level of Independence: Independent with assistive device(s)        Comments: Pt uses SPC for functional mobility, no assistance with ADLs        OT Problem List: Decreased strength;Decreased activity tolerance;Impaired balance (sitting and/or standing);Decreased safety awareness;Decreased knowledge of use of DME or AE;Pain      OT Treatment/Interventions: Self-care/ADL training;Therapeutic exercise;DME and/or AE instruction;Therapeutic activities;Patient/family education    OT Goals(Current goals can be found in the care plan section) Acute Rehab OT Goals Patient Stated Goal: to do whatever it takes to get better OT Goal Formulation: With patient Time For Goal Achievement: 12/27/17 Potential to Achieve Goals: Good  OT Frequency: Min 2X/week   Barriers to D/C: Decreased caregiver support          Co-evaluation PT/OT/SLP Co-Evaluation/Treatment: Yes Reason for Co-Treatment: Complexity of the patient's impairments (multi-system involvement);To address functional/ADL transfers   OT goals addressed during session: ADL's and self-care;Proper use of Adaptive equipment and DME          End of Session Equipment Utilized During Treatment: Gait belt;Rolling walker Nurse Communication: Mobility status  Activity Tolerance: Patient tolerated treatment well Patient left: in chair;with call bell/phone within reach;with chair alarm set  OT Visit Diagnosis: Repeated falls (R29.6);Muscle weakness (generalized) (M62.81);Pain Pain - Right/Left: (mid to low back) Pain - part of body: (back)                Time: 6773-7366 OT Time Calculation (min): 40 min Charges:  OT General Charges $OT Visit: 1 Visit OT Evaluation $OT Eval Moderate Complexity: Gem, OTR/L  [phone] 12/13/2017, 9:03 AM

## 2017-12-13 NOTE — NC FL2 (Signed)
[place] LEVEL OF CARE SCREENING TOOL     IDENTIFICATION  Patient Name: Cindy Schultz Birthdate: Oct 21, 1923 Sex: female Admission Date (Current Location): 12/12/2017  [facility] [place] Number:  [employer] and Address:  [address], [place]      Provider Number: [id number]  Attending Physician Name and Address:  Sinda Du, MD  Relative Name and Phone Number:       Current Level of Care: Hospital Recommended Level of Care: [place] Prior Approval Number:    Date Approved/Denied:   PASRR Number: [id number])  Discharge Plan: SNF    Current Diagnoses: Patient Active Problem List   Diagnosis Date Noted  . Back pain 12/12/2017  . Abdominal pain 08/28/2017  . Primary osteoarthritis of right hip 07/19/2017  . Tear of right rotator cuff 07/19/2017  . CKD (chronic kidney disease) stage 3, GFR 30-59 ml/min (HCC) 05/22/2017  . Macrocytosis   . Diverticulosis of large intestine with hemorrhage   . Rectal bleed 05/19/2017  . CKD (chronic kidney disease) stage 4, GFR 15-29 ml/min (HCC) 05/19/2017  . LLQ pain 03/08/2017  . Colon polyp 03/08/2017  . Constipation 03/08/2017  . Acute blood loss anemia 09/10/2014  . GIB (gastrointestinal bleeding) 09/08/2014  . Occlusion and stenosis of carotid artery without mention of cerebral infarction 12/14/2013  . Osteoarthritis of left knee 04/23/2013  . Thumb pain 10/22/2012  . De Quervain's syndrome (tenosynovitis) 10/22/2012  . [facility] DJD(carpometacarpal degenerative joint disease), localized primary 10/22/2012  . CTS (carpal tunnel syndrome) 10/22/2012  . Trigger thumb of right hand 10/22/2012  . Pes anserinus bursitis of right knee 06/26/2012  . OA (osteoarthritis) of knee 06/26/2012  . Leg pain, left 06/26/2012  . Radicular pain of left lower extremity 06/26/2012  . Diverticulosis of colon with hemorrhage 09/22/2011  . Syncopal  episodes 09/20/2011  . GI bleed 09/19/2011  . HTN (hypertension) 09/19/2011  . Anemia due to blood loss, acute 09/19/2011  . Abnormality of gait 01/03/2011  . Personal history of fall 12/27/2010  . DISLOCATION CLOSED SHOULDER NEC 04/25/2010  . ANSERINE BURSITIS, LEFT 02/13/2010  . KNEE PAIN 12/22/2009  . LEG PAIN, BILATERAL 12/22/2009  . TOTAL KNEE FOLLOW-UP 12/22/2009  . HIP PAIN 02/07/2009  . LOW BACK PAIN 02/07/2009    Orientation RESPIRATION BLADDER Height & Weight     Self, Time, Situation, Place  Normal Continent Weight: 123 lb 3.8 oz (55.9 kg) Height:  5\' 3"  (160 cm)  BEHAVIORAL SYMPTOMS/MOOD NEUROLOGICAL BOWEL NUTRITION STATUS      Continent Diet  AMBULATORY STATUS COMMUNICATION OF NEEDS Skin   Limited Assist Verbally Normal                       Personal Care Assistance Level of Assistance  Bathing, Feeding, Dressing Bathing Assistance: Limited assistance Feeding assistance: Independent Dressing Assistance: Limited assistance     Functional Limitations Info  Sight, Hearing, Speech Sight Info: Adequate Hearing Info: Adequate Speech Info: Adequate    SPECIAL CARE FACTORS FREQUENCY  PT (By licensed PT), OT (By licensed OT)     PT Frequency: 5x/week OT Frequency: 3x/week            Contractures Contractures Info: Not present    Additional Factors Info  Code Status, Allergies Code Status Info: Full Code Allergies Info: Codeine, Aspirin, Pepto-bismol, Hydrocodone            Current Medications (12/13/2017):  This  is the current hospital active medication list Current Facility-Administered Medications  Medication Dose Route Frequency Provider Last Rate Last Dose  . 0.9 %  sodium chloride infusion   Intravenous Continuous Dana Allan I, MD 75 mL/hr at 12/13/17 0005    . acetaminophen (TYLENOL) tablet 500 mg  500 mg Oral Q6H PRN Bonnell Public, MD      . calcium-vitamin D (OSCAL WITH D) 500-200 MG-UNIT per tablet 1 tablet  1 tablet Oral  Daily Dana Allan I, MD   1 tablet at 12/13/17 1016  . dorzolamide-timolol (COSOPT) 22.3-6.8 MG/ML ophthalmic solution 1 drop  1 drop Both Eyes BID Dana Allan I, MD   1 drop at 12/13/17 0056  . gabapentin (NEURONTIN) capsule 200 mg  200 mg Oral TID Carole Civil, MD   200 mg at 12/13/17 1016  . latanoprost (XALATAN) 0.005 % ophthalmic solution 1 drop  1 drop Both Eyes QHS Dana Allan I, MD   1 drop at 12/13/17 0058  . lidocaine (LIDODERM) 5 % 1 patch  1 patch Transdermal Q24H Dana Allan I, MD   1 patch at 12/13/17 0057  . losartan (COZAAR) tablet 50 mg  50 mg Oral Daily Dana Allan I, MD   50 mg at 12/13/17 1016  . methylPREDNISolone sodium succinate (SOLU-MEDROL) 40 mg/mL injection 40 mg  40 mg Intravenous Q6H Carole Civil, MD   40 mg at 12/13/17 1017  . multivitamin-lutein (OCUVITE-LUTEIN) capsule 1 capsule  1 capsule Oral q morning - 10a Sinda Du, MD   1 capsule at 12/13/17 1015  . NIFEdipine (PROCARDIA-XL/ADALAT-CC/NIFEDICAL-XL) 24 hr tablet 30 mg  30 mg Oral Daily Dana Allan I, MD   30 mg at 12/13/17 1015  . oxybutynin (DITROPAN-XL) 24 hr tablet 10 mg  10 mg Oral Daily Dana Allan I, MD   10 mg at 12/13/17 1015  . oxyCODONE (OXYCONTIN) 12 hr tablet 10 mg  10 mg Oral Q12H Dana Allan I, MD   10 mg at 12/13/17 1016  . polyethylene glycol (MIRALAX / GLYCOLAX) packet 17 g  17 g Oral Daily Dana Allan I, MD   17 g at 12/13/17 1015  . promethazine (PHENERGAN) tablet 12.5 mg  12.5 mg Oral Q6H PRN Bonnell Public, MD         Discharge Medications: Please see discharge summary for a list of discharge medications.  Relevant Imaging Results:  Relevant Lab Results:   Additional Information SSN [id number], Clydene Pugh, LCSW

## 2017-12-13 NOTE — Evaluation (Signed)
Physical Therapy Evaluation Patient Details Name: Cindy Schultz MRN: [id number] DOB: [birth year] Today's Date: 12/13/2017   History of Present Illness  Patient is a 84 year old Caucasian female, lives alone at home, with past medical history significant for low back pain that is currently being worked up by the orthopedic team.  Patient has been on oral prednisone, gabapentin and tramadol for the low back pain.  Other past medical history is as documented below.  Orthopedic team had originally scheduled MRI of the lumbar region for tomorrow.  Unfortunately, the patient fell at home today.  According to the patient, she was leaving the house to go get money from the bank, as well as pick up a medication.  Patient could not confirm if this was a mechanical fall, or if patient passed out and fell.  The patient cannot tell me how long for she was down, but patient activated the life alert.  On presentation to the hospital, the x-ray done on revealing.  MRI is pending.  Lab work is also pending.  No headache, no neck pain, no fever chills, no shortness of breath, no chest pain, no URI symptoms, no GI symptoms and no urinary symptoms.  Hospitalist team has been asked to admit patient for further assessment and management.    Clinical Impression  Cindy Schultz is a 84 y.o. presenting for PT evaluation with recent decrease in functional mobility secondary to exacerbation of low back pain following a fall in her home. Patient received with OT in room completing evaluation. She currently requires minimum assist for bed mobility with use of bed rails and minimum to moderate assist for transfers/gait with use of RW. She required minimum assist to maintain balance during seated functional activities with toileting, and is unable to maintain static standing balance without external support. She will benefit from skilled PT to address current impairments and from follow up PT at below venue to address mobility and  improve independence and safety with gait/mobility and address balance impairments. She is not safe to return home alone at this time. Acute PT will follow.     Follow Up Recommendations SNF    Equipment Recommendations       Recommendations for Other Services       Precautions / Restrictions Precautions Precautions: Fall Restrictions Weight Bearing Restrictions: No      Mobility  Bed Mobility Overal bed mobility: Needs Assistance Bed Mobility: Supine to Sit     Supine to sit: Min assist     General bed mobility comments: performed with bed flat and ues of bed rail, verbal/tactile cues needed for sequencing  Transfers Overall transfer level: Needs assistance Equipment used: Rolling walker (2 wheeled);1 person hand held assist Transfers: Sit to/from [facility] Sit to Stand: Mod assist;Min assist Stand pivot transfers: Min assist       General transfer comment: mod assist for transfer with 1 person hand held versus min assist with RW and cues for safe management of RW  Ambulation/Gait Ambulation/Gait assistance: Min assist;Mod assist Gait Distance (Feet): 12 Feet(2 bouts - to bathroom) Assistive device: Rolling walker (2 wheeled) Gait Pattern/deviations: Decreased step length - right;Step-to pattern;Decreased stride length;Decreased weight shift to right;Shuffle   Gait velocity interpretation: <1.31 ft/sec, indicative of household ambulator General Gait Details: slow and labored gait, moderate assist required for ~4 feet ambulation to move bed to chair with hand held assist, Min assist wtih RW for ~ 12 feet gait to bathroom      Balance Overall  balance assessment: Needs assistance Sitting-balance support: No upper extremity supported;Feet supported Sitting balance-Leahy Scale: Fair Sitting balance - Comments: patient requires support for seated activities for toileting     Standing balance-Leahy Scale: Poor          Pertinent Vitals/Pain  Pain Assessment: No/denies pain    Home Living Family/patient expects to be discharged to:: Private residence Living Arrangements: Alone Available Help at Discharge: Family;Available PRN/intermittently Type of Home: House Home Access: Stairs to enter Entrance Stairs-Rails: Right;Left;Can reach both Entrance Stairs-Number of Steps: 3 Home Layout: One level Home Equipment: Walker - 2 wheels;Cane - single point;Bedside commode;Shower seat;Wheelchair - manual Additional Comments: Pt only uses SPC    Prior Function Level of Independence: Independent with assistive device(s)      Comments: Pt uses SPC for functional mobility, no assistance with ADLs     Hand Dominance   Dominant Hand: Right    Extremity/Trunk Assessment   Upper Extremity Assessment Upper Extremity Assessment: Defer to OT evaluation    Lower Extremity Assessment Lower Extremity Assessment: Generalized weakness(grossly 3/5 up to 4-/5)    Cervical / Trunk Assessment Cervical / Trunk Assessment: Kyphotic  Communication   Communication: HOH(wears bilateral hearing aids)  Cognition Arousal/Alertness: Awake/alert Behavior During Therapy: WFL for tasks assessed/performed Overall Cognitive Status: Within Functional Limits for tasks assessed              Assessment/Plan    PT Assessment Patient needs continued PT services  PT Problem List Decreased strength;Decreased activity tolerance;Decreased mobility;Decreased safety awareness;Decreased balance;Decreased knowledge of use of DME       PT Treatment Interventions DME instruction;Therapeutic activities;Balance training;Gait training;Functional mobility training;Therapeutic exercise;Patient/family education    PT Goals (Current goals can be found in the Care Plan section)  Acute Rehab PT Goals Patient Stated Goal: to do whatever it takes to get better PT Goal Formulation: With patient Time For Goal Achievement: 12/20/17 Potential to Achieve Goals:  Fair    Frequency Min 3X/week   Barriers to discharge Decreased caregiver support      Co-evaluation PT/OT/SLP Co-Evaluation/Treatment: Yes Reason for Co-Treatment: Complexity of the patient's impairments (multi-system involvement) PT goals addressed during session: Mobility/safety with mobility;Balance;Proper use of DME OT goals addressed during session: ADL's and self-care;Proper use of Adaptive equipment and DME       AM-PAC PT "6 Clicks" Daily Activity  Outcome Measure Difficulty turning over in bed (including adjusting bedclothes, sheets and blankets)?: A Little Difficulty moving from lying on back to sitting on the side of the bed? : Unable Difficulty sitting down on and standing up from a chair with arms (e.g., wheelchair, bedside commode, etc,.)?: Unable Help needed moving to and from a bed to chair (including a wheelchair)?: A Little Help needed walking in hospital room?: A Little Help needed climbing 3-5 steps with a railing? : Total 6 Click Score: 12    End of Session Equipment Utilized During Treatment: Gait belt;Other (comment)(RW) Activity Tolerance: Patient tolerated treatment well Patient left: in chair;with call bell/phone within reach;with chair alarm set Nurse Communication: Mobility status PT Visit Diagnosis: Unsteadiness on feet (R26.81);Other abnormalities of gait and mobility (R26.89);History of falling (Z91.81);Muscle weakness (generalized) (M62.81)    Time: 3785-8850 PT Time Calculation (min) (ACUTE ONLY): 30 min   Charges:   PT Evaluation $PT Eval Moderate Complexity: 1 Mod     PT G Codes:        Kipp Brood, PT, DPT Physical Therapist with [facility]  12/13/2017 10:30 AM

## 2017-12-13 NOTE — Progress Notes (Signed)
Patient ID: Cindy Schultz, female   DOB: 06-24-1923, 84 y.o.   MRN: [id number] Preliminary note on [name] with chronic lower back pain.  Patient is 84 years old she is been suffering from lower back and leg pain for several months now.  She is probably not a surgical candidate although if we found compressive lesions that threaten the spine that would change.  However she is scheduled for MRI which would be necessary prior to even epidural intervention  Recommend starting IV steroids and increase the gabapentin to try to obtain some pain control.  Full consult to follow

## 2017-12-13 NOTE — Progress Notes (Signed)
Subjective: She is here after a fall at home.  She says she reached down to pick up a piece of paper lost her balance and ended up on her porch.  She eventually worked her way back to the house and called EMS.  She was not able to ambulate.  She was brought to the emergency department and x-rays did not show any fracture but she was having severe pain and was unable to ambulate at all.  She appeared to be somewhat dehydrated on admission and has received IV fluids.  She is been having back pain for several months and has been on prednisone gabapentin and tramadol which has not been very effective  Objective: Vital signs in last 24 hours: Temp:  [97.6 F (36.4 C)-98.4 F (36.9 C)] 98.4 F (36.9 C) (06/28 0535) Pulse Rate:  [64-93] 80 (06/28 0535) Resp:  [19-20] 20 (06/28 0535) BP: (96-147)/(54-78) 118/71 (06/28 0535) SpO2:  [97 %-100 %] 99 % (06/28 0535) Weight:  [55.9 kg (123 lb 3.8 oz)] 55.9 kg (123 lb 3.8 oz) (06/27 2344) Weight change:     Intake/Output from previous day: 06/27 0701 - 06/28 0700 In: 218.8 [I.V.:218.8] Out: -   PHYSICAL EXAM General appearance: alert, cooperative and mild distress Resp: clear to auscultation bilaterally Cardio: regular rate and rhythm, S1, S2 normal, no murmur, click, rub or gallop GI: soft, non-tender; bowel sounds normal; no masses,  no organomegaly Extremities: extremities normal, atraumatic, no cyanosis or edema  Lab Results:  Results for orders placed or performed during the hospital encounter of 12/12/17 (from the past 48 hour(s))  Basic metabolic panel     Status: Abnormal   Collection Time: 12/12/17  9:58 PM  Result Value Ref Range   Sodium 141 135 - 145 mmol/L   Potassium 3.6 3.5 - 5.1 mmol/L   Chloride 105 98 - 111 mmol/L    Comment: Please note change in reference range.   CO2 26 22 - 32 mmol/L   Glucose, Bld 84 70 - 99 mg/dL    Comment: Please note change in reference range.   BUN 41 (H) 8 - 23 mg/dL    Comment: Please note  change in reference range.   Creatinine, Ser 1.78 (H) 0.44 - 1.00 mg/dL   Calcium 9.5 8.9 - 10.3 mg/dL   GFR calc non Af Amer 24 (L) >60 mL/min   GFR calc Af Amer 28 (L) >60 mL/min    Comment: (NOTE) The eGFR has been calculated using the CKD EPI equation. This calculation has not been validated in all clinical situations. eGFR's persistently <60 mL/min signify possible Chronic Kidney Disease.    Anion gap 10 5 - 15    Comment: Performed at [facility], [address] [place], [place] [address]  CBC with Differential     Status: Abnormal   Collection Time: 12/12/17  9:58 PM  Result Value Ref Range   WBC 12.6 (H) 4.0 - 10.5 K/uL    Comment: WHITE COUNT CONFIRMED ON SMEAR   RBC 3.74 (L) 3.87 - 5.11 MIL/uL   Hemoglobin 12.6 12.0 - 15.0 g/dL   HCT 39.1 36.0 - 46.0 %   MCV 104.5 (H) 78.0 - 100.0 fL   MCH 33.7 26.0 - 34.0 pg   MCHC 32.2 30.0 - 36.0 g/dL   RDW 15.5 11.5 - 15.5 %   Platelets 188 150 - 400 K/uL   Neutrophils Relative % 77 %   Neutro Abs 9.8 1.7 - 7.7 K/uL   Lymphocytes  Relative 12 %   Lymphs Abs 1.5 0.7 - 4.0 K/uL   Monocytes Relative 10 %   Monocytes Absolute 1.3 0.1 - 1.0 K/uL   Eosinophils Relative 1 %   Eosinophils Absolute 0.1 0.0 - 0.7 K/uL   Basophils Relative 0 %   Basophils Absolute 0.0 0.0 - 0.1 K/uL    Comment: Performed at [facility], [address] [place], [place] [address]  Magnesium     Status: None   Collection Time: 12/12/17  9:58 PM  Result Value Ref Range   Magnesium 2.1 1.7 - 2.4 mg/dL    Comment: Performed at [facility], [address], [place], [place] [address]  Phosphorus     Status: None   Collection Time: 12/12/17  9:58 PM  Result Value Ref Range   Phosphorus 3.2 2.5 - 4.6 mg/dL    Comment: Performed at [facility], [address] [place], [place] [address]  Basic metabolic panel     Status: Abnormal   Collection Time: 12/13/17  6:52 AM  Result Value Ref Range   Sodium 141 135 - 145 mmol/L   Potassium 4.5 3.5 - 5.1 mmol/L     Comment: DELTA CHECK NOTED   Chloride 108 98 - 111 mmol/L    Comment: Please note change in reference range.   CO2 25 22 - 32 mmol/L   Glucose, Bld 128 (H) 70 - 99 mg/dL    Comment: Please note change in reference range.   BUN 41 (H) 8 - 23 mg/dL    Comment: Please note change in reference range.   Creatinine, Ser 1.60 (H) 0.44 - 1.00 mg/dL   Calcium 8.7 (L) 8.9 - 10.3 mg/dL   GFR calc non Af Amer 27 (L) >60 mL/min   GFR calc Af Amer 31 (L) >60 mL/min    Comment: (NOTE) The eGFR has been calculated using the CKD EPI equation. This calculation has not been validated in all clinical situations. eGFR's persistently <60 mL/min signify possible Chronic Kidney Disease.    Anion gap 8 5 - 15    Comment: Performed at [facility], [address], [place], [place] [address]  CBC     Status: Abnormal   Collection Time: 12/13/17  6:52 AM  Result Value Ref Range   WBC 8.9 4.0 - 10.5 K/uL   RBC 3.52 (L) 3.87 - 5.11 MIL/uL   Hemoglobin 11.7 (L) 12.0 - 15.0 g/dL   HCT 36.8 36.0 - 46.0 %   MCV 104.5 (H) 78.0 - 100.0 fL   MCH 33.2 26.0 - 34.0 pg   MCHC 31.8 30.0 - 36.0 g/dL   RDW 15.6 (H) 11.5 - 15.5 %   Platelets 177 150 - 400 K/uL    Comment: Performed at [facility], [address], [place], [place] [address]    ABGS No results for input(s): PHART, PO2ART, TCO2, HCO3 in the last 72 hours.  Invalid input(s): PCO2 CULTURES No results found for this or any previous visit (from the past 240 hour(s)). Studies/Results: Dg Lumbar Spine Complete  Result Date: 12/12/2017 CLINICAL DATA:  Low back pain.  Unwitnessed fall. EXAM: LUMBAR SPINE - COMPLETE 4+ VIEW COMPARISON:  08/28/2017 CT abdomen/pelvis. FINDINGS: This report assumes 5 non rib-bearing lumbar vertebrae. Lumbar vertebral body heights are preserved, with no fracture. Moderate multilevel lumbar degenerative disc disease. No spondylolisthesis. Moderate bilateral lower lumbar facet arthropathy. No aggressive appearing focal osseous lesions.  Abdominal aortic atherosclerosis. IMPRESSION: 1. No lumbar spine fracture or spondylolisthesis. 2. Moderate  multilevel lumbar degenerative changes as detailed. Electronically Signed   By: Ilona Sorrel M.D.   On: 12/12/2017 20:33   Mr Lumbar Spine Wo Contrast  Result Date: 12/13/2017 CLINICAL DATA:  [place] yesterday.  Back pain.  Negative radiography. EXAM: MRI LUMBAR SPINE WITHOUT CONTRAST TECHNIQUE: Multiplanar, multisequence MR imaging of the lumbar spine was performed. No intravenous contrast was administered. COMPARISON:  Radiography 12/12/2017 FINDINGS: Segmentation:  5 lumbar type vertebral bodies. Alignment:  Mild curvature convex to the left with the apex at L3. Vertebrae: Acute superior endplate fracture at L3 with less than 10% loss of height anteriorly. Mild bone marrow edema. No retropulsed bone. No traumatic disc herniation. No sign of pre-existing pathologic bone lesion. No other regional fracture or bone lesion. Conus medullaris and cauda equina: Conus extends to the L1 level. Conus and cauda equina appear normal. Paraspinal and other soft tissues: Negative Disc levels: Non-compressive disc bulges at L2-3 and above. L3-4: Moderate disc bulge. Mild facet and ligamentous hypertrophy. Mild narrowing of the right lateral recess and intervertebral foramen on the right but without distinct neural compression. L4-5: Moderate bulging of the disc. Mild facet and ligamentous hypertrophy. Mild narrowing of both lateral recesses but without compressive stenosis. L5-S1: Mild disc bulge. Facet degeneration. Mild left foraminal narrowing without likely neural compression. IMPRESSION: Acute superior endplate fracture at L3 with loss of height of approximately 10% anteriorly. No retropulsed bone. Regional edema. This looks like a benign fracture. Ordinary degenerative changes throughout the lumbar region as outlined above without likely neural compression. Electronically Signed   By: Nelson Chimes M.D.   On:  12/13/2017 08:19   Dg Hip Unilat W Or Wo Pelvis 2-3 Views Right  Result Date: 12/12/2017 CLINICAL DATA:  Right hip pain after unwitnessed fall at home. EXAM: DG HIP (WITH OR WITHOUT PELVIS) 2-3V RIGHT COMPARISON:  05/25/2015 pelvis and right hip radiographs. FINDINGS: There is lower lumbar degenerative disc and facet arthropathy from L4 through S1. No pelvic diastasis. Moderate left and marked right joint space narrowing with spurring is identified about both hips consistent with osteoarthritis. No acute displaced fracture. IMPRESSION: Osteoarthritis of both hips, right worse than left similar to prior. No acute fracture is identified. Rim of osteophytes are noted about the femoral head-neck junctions bilaterally and off the acetabula. Electronically Signed   By: Ashley Royalty M.D.   On: 12/12/2017 20:39    Medications:  Prior to Admission:  Medications Prior to Admission  Medication Sig Dispense Refill Last Dose  . acetaminophen (TYLENOL) 500 MG tablet Take 500 mg by mouth every 6 (six) hours as needed (pain).    Past Week at Unknown time  . Calcium 600-200 MG-UNIT per tablet Take 1 tablet by mouth daily.     12/12/2017 at Unknown time  . dorzolamide-timolol (COSOPT) 22.3-6.8 MG/ML ophthalmic solution Place 1 drop into both eyes 2 (two) times daily.     12/12/2017 at Unknown time  . gabapentin (NEURONTIN) 100 MG capsule Take 1 capsule (100 mg total) by mouth 3 (three) times daily. 90 capsule 2 12/12/2017 at Unknown time  . losartan (COZAAR) 50 MG tablet Take 50 mg by mouth daily.    12/11/2017 at Unknown time  . LUMIGAN 0.01 % SOLN Place 1 drop into both eyes at bedtime.    12/11/2017 at Unknown time  . Melatonin 5 MG TABS Take by mouth at bedtime.   12/11/2017 at Unknown time  . Multiple Vitamins-Minerals (PRESERVISION AREDS 2 PO) Take 2 tablets by mouth every morning.  One tablet daily    12/12/2017 at Unknown time  . NIFEdipine (PROCARDIA-XL/ADALAT-CC/NIFEDICAL-XL) 30 MG 24 hr tablet Take 30 mg by mouth  daily.  11 12/12/2017 at Unknown time  . oxybutynin (DITROPAN-XL) 10 MG 24 hr tablet Take 10 mg by mouth daily.    12/12/2017 at Unknown time  . pantoprazole (PROTONIX) 40 MG tablet Take 40 mg by mouth daily.  12 12/12/2017 at Unknown time  . polyethylene glycol (MIRALAX / GLYCOLAX) packet Take 17 g by mouth daily.   12/12/2017 at Unknown time  . promethazine (PHENERGAN) 12.5 MG tablet Take 1 tablet (12.5 mg total) by mouth every 6 (six) hours as needed for nausea or vomiting. 30 tablet 0 12/11/2017 at Unknown time  . traMADol (ULTRAM) 50 MG tablet Take 1 tablet (50 mg total) by mouth every 6 (six) hours as needed. (Patient taking differently: Take 25-50 mg by mouth every 6 (six) hours as needed. ) 60 tablet 5 12/11/2017 at Unknown time   Scheduled: . calcium-vitamin D  1 tablet Oral Daily  . dorzolamide-timolol  1 drop Both Eyes BID  . gabapentin  200 mg Oral TID  . heparin  5,000 Units Subcutaneous Q12H  . latanoprost  1 drop Both Eyes QHS  . lidocaine  1 patch Transdermal Q24H  . losartan  50 mg Oral Daily  . Melatonin  4.5 mg Oral QHS  . methylPREDNISolone (SOLU-MEDROL) injection  40 mg Intravenous Q6H  . multivitamin-lutein   Oral q morning - 10a  . NIFEdipine  30 mg Oral Daily  . oxybutynin  10 mg Oral Daily  . oxyCODONE  10 mg Oral Q12H  . polyethylene glycol  17 g Oral Daily   Continuous: . sodium chloride 75 mL/hr at 12/13/17 0005   OMA:[id number], promethazine  Assesment: She was admitted with a fall.  This appears to have been a mechanical fall.  She has severe back pain acute on chronic.  It is worse than it has been at home.  She has severe osteoarthritis of multiple joints.  She has hypertension at baseline.  She was dehydrated. Active Problems:   Back pain    Plan: Continue treatments.  PT OT evaluation.  Orthopedic consult.  MRI of the lumbar spine.    LOS: 0 days   Srija Southard L 12/13/2017, 8:25 AM

## 2017-12-13 NOTE — Plan of Care (Signed)
  Problem: Acute Rehab PT Goals(only PT should resolve) Goal: Patient Will Transfer Sit To/From Stand Outcome: Progressing Flowsheets (Taken 12/13/2017 1035) Patient will transfer sit to/from stand: with supervision Note:  With RW Goal: Pt Will Transfer Bed To Chair/Chair To Bed Outcome: Progressing Flowsheets (Taken 12/13/2017 1035) Pt will Transfer Bed to Chair/Chair to Bed: with supervision Note:  With RW Goal: Pt Will Perform Standing Balance Or Pre-Gait Outcome: Progressing Flowsheets (Taken 12/13/2017 1035) Pt will perform standing balance or pre-gait : with Supervision Note:  With intermittent UE support during functional activities Goal: Pt Will Ambulate Outcome: Progressing Flowsheets (Taken 12/13/2017 1035) Pt will Ambulate: with min guard assist;25 feet;with rolling walker

## 2017-12-13 NOTE — Plan of Care (Signed)
  Problem: Acute Rehab OT Goals (only OT should resolve) Goal: Pt. Will Perform Grooming Flowsheets (Taken 12/13/2017 0907) Pt Will Perform Grooming: with supervision;standing Goal: Pt. Will Perform Lower Body Dressing Flowsheets (Taken 12/13/2017 0907) Pt Will Perform Lower Body Dressing: with supervision;sitting/lateral leans;sit to/from stand Goal: Pt. Will Transfer To Toilet Flowsheets (Taken 12/13/2017 0907) Pt Will Transfer to Toilet: with modified independence;with supervision;ambulating;regular height toilet;bedside commode Goal: Pt. Will Perform Toileting-Clothing Manipulation Flowsheets (Taken 12/13/2017 0907) Pt Will Perform Toileting - Clothing Manipulation and hygiene: with modified independence;with supervision;sitting/lateral leans;sit to/from stand Goal: Pt/Caregiver Will Perform Home Exercise Program Flowsheets (Taken 12/13/2017 0907) Pt/caregiver will Perform Home Exercise Program: Increased strength;Both right and left upper extremity;Independently;With written HEP provided

## 2017-12-13 NOTE — Clinical Social Work Note (Signed)
Clinical Social Work Assessment  Patient Details  Name: Cindy Schultz MRN: [id number] Date of Birth: 1924/03/30  Date of referral:  12/13/17               Reason for consult:  Facility Placement                Permission sought to share information with:    Permission granted to share information::     Name::        Agency::     Relationship::     Contact Information:  Dtr. Annye English  Housing/Transportation Living arrangements for the past 2 months:  [place] of Information:  Patient, Adult Children Patient Interpreter Needed:  None Criminal Activity/Legal Involvement Pertinent to Current Situation/Hospitalization:  No - Comment as needed Significant Relationships:  Adult Children Lives with:  Self Do you feel safe going back to the place where you live?  Yes Need for family participation in patient care:  Yes (Comment)  Care giving concerns:  None at baseline.    Social Worker assessment / plan:  Patient ambulates with a tripod cane, drives, is independent in ADLs and has lifeline.  She is agreeable to short term rehab at [facility].   Employment status:  Retired Forensic scientist:  Medicare PT Recommendations:  [place] / Referral to community resources:  [place]  Patient/Family's Response to care:  Patient is agreeable to short term rehab at [facility]   Patient/Family's Understanding of and Emotional Response to Diagnosis, Current Treatment, and Prognosis:  Patient and family understand patient's diagnosis, treatment and prognosis.   Emotional Assessment Appearance:  Appears stated age Attitude/Demeanor/Rapport:    Affect (typically observed):  Accepting Orientation:  Oriented to Self, Oriented to Place, Oriented to Situation, Oriented to  Time Alcohol / Substance use:  Not Applicable Psych involvement (Current and /or in the community):  No (Comment)  Discharge Needs  Concerns to be addressed:  No  discharge needs identified Readmission within the last 30 days:  No Current discharge risk:  None Barriers to Discharge:  No Barriers Identified   Ihor Gully, LCSW 12/13/2017, 4:19 PM

## 2017-12-13 NOTE — Progress Notes (Signed)
Patient ID: Cindy Schultz, female   DOB: 1923-11-30, 84 y.o.   MRN: [id number]   MRI reviewed.  Consult interventional radiology for possible vertebroplasty or kyphoplasty  I just made her n.p.o. and stop her heparin

## 2017-12-14 DIAGNOSIS — M545 Low back pain: Secondary | ICD-10-CM

## 2017-12-14 LAB — URINALYSIS, ROUTINE W REFLEX MICROSCOPIC
Bilirubin Urine: NEGATIVE
Glucose, UA: NEGATIVE mg/dL
Hgb urine dipstick: NEGATIVE
Ketones, ur: NEGATIVE mg/dL
Leukocytes, UA: NEGATIVE
Nitrite: NEGATIVE
Protein, ur: NEGATIVE mg/dL
Specific Gravity, Urine: 1.006 (ref 1.005–1.030)
pH: 5 (ref 5.0–8.0)

## 2017-12-14 NOTE — Progress Notes (Signed)
Subjective: She still has a lot of trouble with her back.  Her MRI showed that she has a compression fracture.  She also has arthritis and some degenerated and bulging disks.  Her pain is a little better.  She is still having trouble ambulating.  It has been recommended that she go to skilled care facility and she agrees to do that now.  Objective: Vital signs in last 24 hours: Temp:  [97.7 F (36.5 C)-98.8 F (37.1 C)] 97.7 F (36.5 C) (06/29 0523) Pulse Rate:  [66-76] 76 (06/29 0523) Resp:  [16-18] 16 (06/28 2110) BP: (112-126)/(63-73) 121/66 (06/29 0523) SpO2:  [95 %-96 %] 96 % (06/29 0523) Weight change:     Intake/Output from previous day: No intake/output data recorded.  PHYSICAL EXAM General appearance: alert, cooperative and no distress Resp: clear to auscultation bilaterally Cardio: regular rate and rhythm, S1, S2 normal, no murmur, click, rub or gallop GI: soft, non-tender; bowel sounds normal; no masses,  no organomegaly Extremities: extremities normal, atraumatic, no cyanosis or edema  Lab Results:  Results for orders placed or performed during the hospital encounter of 12/12/17 (from the past 48 hour(s))  Basic metabolic panel     Status: Abnormal   Collection Time: 12/12/17  9:58 PM  Result Value Ref Range   Sodium 141 135 - 145 mmol/L   Potassium 3.6 3.5 - 5.1 mmol/L   Chloride 105 98 - 111 mmol/L    Comment: Please note change in reference range.   CO2 26 22 - 32 mmol/L   Glucose, Bld 84 70 - 99 mg/dL    Comment: Please note change in reference range.   BUN 41 (H) 8 - 23 mg/dL    Comment: Please note change in reference range.   Creatinine, Ser 1.78 (H) 0.44 - 1.00 mg/dL   Calcium 9.5 8.9 - 10.3 mg/dL   GFR calc non Af Amer 24 (L) >60 mL/min   GFR calc Af Amer 28 (L) >60 mL/min    Comment: (NOTE) The eGFR has been calculated using the CKD EPI equation. This calculation has not been validated in all clinical situations. eGFR's persistently <60 mL/min  signify possible Chronic Kidney Disease.    Anion gap 10 5 - 15    Comment: Performed at [facility], [address] [place], [place] [address]  CBC with Differential     Status: Abnormal   Collection Time: 12/12/17  9:58 PM  Result Value Ref Range   WBC 12.6 (H) 4.0 - 10.5 K/uL    Comment: WHITE COUNT CONFIRMED ON SMEAR   RBC 3.74 (L) 3.87 - 5.11 MIL/uL   Hemoglobin 12.6 12.0 - 15.0 g/dL   HCT 39.1 36.0 - 46.0 %   MCV 104.5 (H) 78.0 - 100.0 fL   MCH 33.7 26.0 - 34.0 pg   MCHC 32.2 30.0 - 36.0 g/dL   RDW 15.5 11.5 - 15.5 %   Platelets 188 150 - 400 K/uL   Neutrophils Relative % 77 %   Neutro Abs 9.8 1.7 - 7.7 K/uL   Lymphocytes Relative 12 %   Lymphs Abs 1.5 0.7 - 4.0 K/uL   Monocytes Relative 10 %   Monocytes Absolute 1.3 0.1 - 1.0 K/uL   Eosinophils Relative 1 %   Eosinophils Absolute 0.1 0.0 - 0.7 K/uL   Basophils Relative 0 %   Basophils Absolute 0.0 0.0 - 0.1 K/uL    Comment: Performed at [facility], [address], [place], [place] [address]  Magnesium  Status: None   Collection Time: 12/12/17  9:58 PM  Result Value Ref Range   Magnesium 2.1 1.7 - 2.4 mg/dL    Comment: Performed at [facility], [address] [place], [place] [address]  Phosphorus     Status: None   Collection Time: 12/12/17  9:58 PM  Result Value Ref Range   Phosphorus 3.2 2.5 - 4.6 mg/dL    Comment: Performed at [facility], [address], [place], [place] [address]  Basic metabolic panel     Status: Abnormal   Collection Time: 12/13/17  6:52 AM  Result Value Ref Range   Sodium 141 135 - 145 mmol/L   Potassium 4.5 3.5 - 5.1 mmol/L    Comment: DELTA CHECK NOTED   Chloride 108 98 - 111 mmol/L    Comment: Please note change in reference range.   CO2 25 22 - 32 mmol/L   Glucose, Bld 128 (H) 70 - 99 mg/dL    Comment: Please note change in reference range.   BUN 41 (H) 8 - 23 mg/dL    Comment: Please note change in reference range.   Creatinine, Ser 1.60 (H) 0.44 - 1.00 mg/dL   Calcium  8.7 (L) 8.9 - 10.3 mg/dL   GFR calc non Af Amer 27 (L) >60 mL/min   GFR calc Af Amer 31 (L) >60 mL/min    Comment: (NOTE) The eGFR has been calculated using the CKD EPI equation. This calculation has not been validated in all clinical situations. eGFR's persistently <60 mL/min signify possible Chronic Kidney Disease.    Anion gap 8 5 - 15    Comment: Performed at [facility], [address], [place], [place] [address]  CBC     Status: Abnormal   Collection Time: 12/13/17  6:52 AM  Result Value Ref Range   WBC 8.9 4.0 - 10.5 K/uL   RBC 3.52 (L) 3.87 - 5.11 MIL/uL   Hemoglobin 11.7 (L) 12.0 - 15.0 g/dL   HCT 36.8 36.0 - 46.0 %   MCV 104.5 (H) 78.0 - 100.0 fL   MCH 33.2 26.0 - 34.0 pg   MCHC 31.8 30.0 - 36.0 g/dL   RDW 15.6 (H) 11.5 - 15.5 %   Platelets 177 150 - 400 K/uL    Comment: Performed at [facility], [address] [place], [place] [address]  Protime-INR     Status: None   Collection Time: 12/13/17 10:42 AM  Result Value Ref Range   Prothrombin Time 13.9 11.4 - 15.2 seconds   INR 1.07     Comment: Performed at [facility], [address] [place], [place] [address]    ABGS No results for input(s): PHART, PO2ART, TCO2, HCO3 in the last 72 hours.  Invalid input(s): PCO2 CULTURES No results found for this or any previous visit (from the past 240 hour(s)). Studies/Results: Dg Lumbar Spine Complete  Result Date: 12/12/2017 CLINICAL DATA:  Low back pain.  Unwitnessed fall. EXAM: LUMBAR SPINE - COMPLETE 4+ VIEW COMPARISON:  08/28/2017 CT abdomen/pelvis. FINDINGS: This report assumes 5 non rib-bearing lumbar vertebrae. Lumbar vertebral body heights are preserved, with no fracture. Moderate multilevel lumbar degenerative disc disease. No spondylolisthesis. Moderate bilateral lower lumbar facet arthropathy. No aggressive appearing focal osseous lesions. Abdominal aortic atherosclerosis. IMPRESSION: 1. No lumbar spine fracture or spondylolisthesis. 2. Moderate multilevel lumbar  degenerative changes as detailed. Electronically Signed   By: Ilona Sorrel M.D.   On: 12/12/2017 20:33   Mr Lumbar Spine Wo Contrast  Result  Date: 12/13/2017 CLINICAL DATA:  [place] yesterday.  Back pain.  Negative radiography. EXAM: MRI LUMBAR SPINE WITHOUT CONTRAST TECHNIQUE: Multiplanar, multisequence MR imaging of the lumbar spine was performed. No intravenous contrast was administered. COMPARISON:  Radiography 12/12/2017 FINDINGS: Segmentation:  5 lumbar type vertebral bodies. Alignment:  Mild curvature convex to the left with the apex at L3. Vertebrae: Acute superior endplate fracture at L3 with less than 10% loss of height anteriorly. Mild bone marrow edema. No retropulsed bone. No traumatic disc herniation. No sign of pre-existing pathologic bone lesion. No other regional fracture or bone lesion. Conus medullaris and cauda equina: Conus extends to the L1 level. Conus and cauda equina appear normal. Paraspinal and other soft tissues: Negative Disc levels: Non-compressive disc bulges at L2-3 and above. L3-4: Moderate disc bulge. Mild facet and ligamentous hypertrophy. Mild narrowing of the right lateral recess and intervertebral foramen on the right but without distinct neural compression. L4-5: Moderate bulging of the disc. Mild facet and ligamentous hypertrophy. Mild narrowing of both lateral recesses but without compressive stenosis. L5-S1: Mild disc bulge. Facet degeneration. Mild left foraminal narrowing without likely neural compression. IMPRESSION: Acute superior endplate fracture at L3 with loss of height of approximately 10% anteriorly. No retropulsed bone. Regional edema. This looks like a benign fracture. Ordinary degenerative changes throughout the lumbar region as outlined above without likely neural compression. Electronically Signed   By: Nelson Chimes M.D.   On: 12/13/2017 08:19   Dg Hip Unilat W Or Wo Pelvis 2-3 Views Right  Result Date: 12/12/2017 CLINICAL DATA:  Right hip pain after  unwitnessed fall at home. EXAM: DG HIP (WITH OR WITHOUT PELVIS) 2-3V RIGHT COMPARISON:  05/25/2015 pelvis and right hip radiographs. FINDINGS: There is lower lumbar degenerative disc and facet arthropathy from L4 through S1. No pelvic diastasis. Moderate left and marked right joint space narrowing with spurring is identified about both hips consistent with osteoarthritis. No acute displaced fracture. IMPRESSION: Osteoarthritis of both hips, right worse than left similar to prior. No acute fracture is identified. Rim of osteophytes are noted about the femoral head-neck junctions bilaterally and off the acetabula. Electronically Signed   By: Ashley Royalty M.D.   On: 12/12/2017 20:39    Medications:  Prior to Admission:  Medications Prior to Admission  Medication Sig Dispense Refill Last Dose  . acetaminophen (TYLENOL) 500 MG tablet Take 500 mg by mouth every 6 (six) hours as needed (pain).    Past Week at Unknown time  . Calcium 600-200 MG-UNIT per tablet Take 1 tablet by mouth daily.     12/12/2017 at Unknown time  . dorzolamide-timolol (COSOPT) 22.3-6.8 MG/ML ophthalmic solution Place 1 drop into both eyes 2 (two) times daily.     12/12/2017 at Unknown time  . gabapentin (NEURONTIN) 100 MG capsule Take 1 capsule (100 mg total) by mouth 3 (three) times daily. 90 capsule 2 12/12/2017 at Unknown time  . losartan (COZAAR) 50 MG tablet Take 50 mg by mouth daily.    12/11/2017 at Unknown time  . LUMIGAN 0.01 % SOLN Place 1 drop into both eyes at bedtime.    12/11/2017 at Unknown time  . Melatonin 5 MG TABS Take by mouth at bedtime.   12/11/2017 at Unknown time  . Multiple Vitamins-Minerals (PRESERVISION AREDS 2 PO) Take 2 tablets by mouth every morning. One tablet daily    12/12/2017 at Unknown time  . NIFEdipine (PROCARDIA-XL/ADALAT-CC/NIFEDICAL-XL) 30 MG 24 hr tablet Take 30 mg by mouth daily.  11 12/12/2017 at  Unknown time  . oxybutynin (DITROPAN-XL) 10 MG 24 hr tablet Take 10 mg by mouth daily.    12/12/2017 at  Unknown time  . pantoprazole (PROTONIX) 40 MG tablet Take 40 mg by mouth daily.  12 12/12/2017 at Unknown time  . polyethylene glycol (MIRALAX / GLYCOLAX) packet Take 17 g by mouth daily.   12/12/2017 at Unknown time  . promethazine (PHENERGAN) 12.5 MG tablet Take 1 tablet (12.5 mg total) by mouth every 6 (six) hours as needed for nausea or vomiting. 30 tablet 0 12/11/2017 at Unknown time  . traMADol (ULTRAM) 50 MG tablet Take 1 tablet (50 mg total) by mouth every 6 (six) hours as needed. (Patient taking differently: Take 25-50 mg by mouth every 6 (six) hours as needed. ) 60 tablet 5 12/11/2017 at Unknown time   Scheduled: . calcium-vitamin D  1 tablet Oral Daily  . dorzolamide-timolol  1 drop Both Eyes BID  . gabapentin  200 mg Oral TID  . latanoprost  1 drop Both Eyes QHS  . lidocaine  1 patch Transdermal Q24H  . losartan  50 mg Oral Daily  . methylPREDNISolone (SOLU-MEDROL) injection  40 mg Intravenous Q6H  . multivitamin-lutein  1 capsule Oral q morning - 10a  . NIFEdipine  30 mg Oral Daily  . oxybutynin  10 mg Oral Daily  . oxyCODONE  10 mg Oral Q12H  . polyethylene glycol  17 g Oral Daily   Continuous: . sodium chloride 75 mL/hr at 12/14/17 0300   URK:[id number], promethazine  Assesment: She has back pain likely from the compression fracture.  She also has arthritis and degenerated and bulging disks.  She is been having trouble for several months and then it got much worse which I think is when she fell prior to admission.  Plan is for her to have kyphoplasty done on 12/16/2017  She has arthritis of multiple joints at baseline  She had a GI bleed x2 in the past but no evidence of bleeding now.  She has some mild cognitive impairment which is exacerbated by her severe hearing loss  She has chronic kidney disease stage IV stable   Active Problems:   Back pain    Plan: Continue current treatments.  For kyphoplasty.  She will then be transferred for rehabilitation.    LOS:  1 day   Tommaso Cavitt L 12/14/2017, 9:27 AM

## 2017-12-14 NOTE — Consult Note (Signed)
Reason for Consult: Acute onset of back pain in the setting of chronic back pain Referring Physician: Dr. Maren Reamer is an 84 y.o. female.  HPI: This is a 84 year old female who is been followed for about a month or more now with lower back pain she has not responded to oral steroids or gabapentin we had her set up for an MRI on the day of admission so that was canceled as an outpatient.  She was admitted after falling at home and unable to stand up.  She complained of severe increase in back pain in the setting of her chronic back pain which is been present again for several weeks if not months.  She has not had any bowel or bladder dysfunction she has no lower extremity significant tingling although the left leg has pain which radiates from the left hip.  She has had hip x-rays which were okay she had a left total knee replacement which is been x-rayed and that was normal  She does complain that the left leg may want to give way at times.  The pain is in the lower back and also upper thoracic region it is sharp severe and occasionally the lower back pain will radiate to the left leg  Past Medical History:  Diagnosis Date  . Cancer [facility]) March 2016   Skin Cancer :  Right Cheek     SCC  . Cancer [facility]) 2013   Skin Cancer  :  Anterior Neck   SCC  . Carotid artery occlusion   . Chronic kidney disease   . Diverticulitis   . DVT (deep venous thrombosis) ([place]) 07/14/10   LEA doppler   . GI bleeding    2013/2016/2018: suspected diverticular bleeding  . Glaucoma   . Hearing loss   . HTN (hypertension)    cholesterol  . Lumbar spine pain   . Lymphedema   . Macular degeneration   . Pain in joints   . Precancerous changes of the cervix    lesions  . Rectocele   . SOB (shortness of breath) 04/04/2010   2D echo EF=>55%    Past Surgical History:  Procedure Laterality Date  . ABDOMINAL HYSTERECTOMY    . appendix    . bladder tac    . COLONOSCOPY  09/20/2011   Dr. Carol Ada: pandiverticulosis, hemorrhoids. ascending colon polyp not removed in setting of active bleeding.  Marland Kitchen COLONOSCOPY N/A 04/10/2017   Dr. [name]: three 4-6 mm polyps in ascending colon and cecum, s/p removal. tubular adenomas. diverticulosis in entire examined colon.   . corneal erosion     cataracts   . elevated metatarsal arch    . fallen uterus    . hemmorrhoidectomy    . left total knee replacement  05/02/04   [name]  . met osteostomy    . morton's nueroma    . OVARIAN CYST SURGERY    . RECTOCELE REPAIR    . rupture rt groin    . salk    . SKIN CANCER EXCISION Right March 2016  2013   Cheek -  [facility]  and  Anterior Neck  . tracheotomy      Family History  Problem Relation Age of Onset  . Stroke Mother   . Diabetes Mother   . Heart attack Father   . COPD Paternal Grandfather   . Dementia Sister   . Colon cancer Neg Hx     Social History:  reports that she has  never smoked. She has never used smokeless tobacco. She reports that she does not drink alcohol or use drugs.  Allergies:  Allergies  Allergen Reactions  . Codeine Nausea And Vomiting  . Aspirin     Cannot take due to Diverticulosis (bleeding side effects)  . Pepto-Bismol [Bismuth Subsalicylate]     Black stools  . Hydrocodone Other (See Comments)    Unknown Reaction     Medications: I have reviewed the patient's current medications.  Results for orders placed or performed during the hospital encounter of 12/12/17 (from the past 48 hour(s))  Basic metabolic panel     Status: Abnormal   Collection Time: 12/12/17  9:58 PM  Result Value Ref Range   Sodium 141 135 - 145 mmol/L   Potassium 3.6 3.5 - 5.1 mmol/L   Chloride 105 98 - 111 mmol/L    Comment: Please note change in reference range.   CO2 26 22 - 32 mmol/L   Glucose, Bld 84 70 - 99 mg/dL    Comment: Please note change in reference range.   BUN 41 (H) 8 - 23 mg/dL    Comment: Please note change in reference range.   Creatinine, Ser 1.78 (H) 0.44 -  1.00 mg/dL   Calcium 9.5 8.9 - 10.3 mg/dL   GFR calc non Af Amer 24 (L) >60 mL/min   GFR calc Af Amer 28 (L) >60 mL/min    Comment: (NOTE) The eGFR has been calculated using the CKD EPI equation. This calculation has not been validated in all clinical situations. eGFR's persistently <60 mL/min signify possible Chronic Kidney Disease.    Anion gap 10 5 - 15    Comment: Performed at [facility], [address], [place], [place] [address]  CBC with Differential     Status: Abnormal   Collection Time: 12/12/17  9:58 PM  Result Value Ref Range   WBC 12.6 (H) 4.0 - 10.5 K/uL    Comment: WHITE COUNT CONFIRMED ON SMEAR   RBC 3.74 (L) 3.87 - 5.11 MIL/uL   Hemoglobin 12.6 12.0 - 15.0 g/dL   HCT 39.1 36.0 - 46.0 %   MCV 104.5 (H) 78.0 - 100.0 fL   MCH 33.7 26.0 - 34.0 pg   MCHC 32.2 30.0 - 36.0 g/dL   RDW 15.5 11.5 - 15.5 %   Platelets 188 150 - 400 K/uL   Neutrophils Relative % 77 %   Neutro Abs 9.8 1.7 - 7.7 K/uL   Lymphocytes Relative 12 %   Lymphs Abs 1.5 0.7 - 4.0 K/uL   Monocytes Relative 10 %   Monocytes Absolute 1.3 0.1 - 1.0 K/uL   Eosinophils Relative 1 %   Eosinophils Absolute 0.1 0.0 - 0.7 K/uL   Basophils Relative 0 %   Basophils Absolute 0.0 0.0 - 0.1 K/uL    Comment: Performed at [facility], [address], [place], [place] [address]  Magnesium     Status: None   Collection Time: 12/12/17  9:58 PM  Result Value Ref Range   Magnesium 2.1 1.7 - 2.4 mg/dL    Comment: Performed at [facility], [address] [place], [place] [address]  Phosphorus     Status: None   Collection Time: 12/12/17  9:58 PM  Result Value Ref Range   Phosphorus 3.2 2.5 - 4.6 mg/dL    Comment: Performed at [facility], [address], [place], [place] [address]  Basic metabolic panel     Status: Abnormal   Collection Time:  12/13/17  6:52 AM  Result Value Ref Range   Sodium 141 135 - 145 mmol/L   Potassium 4.5 3.5 - 5.1 mmol/L    Comment: DELTA CHECK NOTED   Chloride 108 98 - 111 mmol/L     Comment: Please note change in reference range.   CO2 25 22 - 32 mmol/L   Glucose, Bld 128 (H) 70 - 99 mg/dL    Comment: Please note change in reference range.   BUN 41 (H) 8 - 23 mg/dL    Comment: Please note change in reference range.   Creatinine, Ser 1.60 (H) 0.44 - 1.00 mg/dL   Calcium 8.7 (L) 8.9 - 10.3 mg/dL   GFR calc non Af Amer 27 (L) >60 mL/min   GFR calc Af Amer 31 (L) >60 mL/min    Comment: (NOTE) The eGFR has been calculated using the CKD EPI equation. This calculation has not been validated in all clinical situations. eGFR's persistently <60 mL/min signify possible Chronic Kidney Disease.    Anion gap 8 5 - 15    Comment: Performed at [facility], [address] [place], [place] [address]  CBC     Status: Abnormal   Collection Time: 12/13/17  6:52 AM  Result Value Ref Range   WBC 8.9 4.0 - 10.5 K/uL   RBC 3.52 (L) 3.87 - 5.11 MIL/uL   Hemoglobin 11.7 (L) 12.0 - 15.0 g/dL   HCT 36.8 36.0 - 46.0 %   MCV 104.5 (H) 78.0 - 100.0 fL   MCH 33.2 26.0 - 34.0 pg   MCHC 31.8 30.0 - 36.0 g/dL   RDW 15.6 (H) 11.5 - 15.5 %   Platelets 177 150 - 400 K/uL    Comment: Performed at [facility], [address] [place], [place] [address]  Protime-INR     Status: None   Collection Time: 12/13/17 10:42 AM  Result Value Ref Range   Prothrombin Time 13.9 11.4 - 15.2 seconds   INR 1.07     Comment: Performed at [facility], [address], [place], [place] [address]    Dg Lumbar Spine Complete  Result Date: 12/12/2017 CLINICAL DATA:  Low back pain.  Unwitnessed fall. EXAM: LUMBAR SPINE - COMPLETE 4+ VIEW COMPARISON:  08/28/2017 CT abdomen/pelvis. FINDINGS: This report assumes 5 non rib-bearing lumbar vertebrae. Lumbar vertebral body heights are preserved, with no fracture. Moderate multilevel lumbar degenerative disc disease. No spondylolisthesis. Moderate bilateral lower lumbar facet arthropathy. No aggressive appearing focal osseous lesions. Abdominal aortic atherosclerosis.  IMPRESSION: 1. No lumbar spine fracture or spondylolisthesis. 2. Moderate multilevel lumbar degenerative changes as detailed. Electronically Signed   By: Ilona Sorrel M.D.   On: 12/12/2017 20:33   Mr Lumbar Spine Wo Contrast  Result Date: 12/13/2017 CLINICAL DATA:  [place] yesterday.  Back pain.  Negative radiography. EXAM: MRI LUMBAR SPINE WITHOUT CONTRAST TECHNIQUE: Multiplanar, multisequence MR imaging of the lumbar spine was performed. No intravenous contrast was administered. COMPARISON:  Radiography 12/12/2017 FINDINGS: Segmentation:  5 lumbar type vertebral bodies. Alignment:  Mild curvature convex to the left with the apex at L3. Vertebrae: Acute superior endplate fracture at L3 with less than 10% loss of height anteriorly. Mild bone marrow edema. No retropulsed bone. No traumatic disc herniation. No sign of pre-existing pathologic bone lesion. No other regional fracture or bone lesion. Conus medullaris and cauda equina: Conus extends to the L1 level. Conus and cauda equina appear normal. Paraspinal and other soft tissues: Negative Disc levels: Non-compressive disc bulges at L2-3  and above. L3-4: Moderate disc bulge. Mild facet and ligamentous hypertrophy. Mild narrowing of the right lateral recess and intervertebral foramen on the right but without distinct neural compression. L4-5: Moderate bulging of the disc. Mild facet and ligamentous hypertrophy. Mild narrowing of both lateral recesses but without compressive stenosis. L5-S1: Mild disc bulge. Facet degeneration. Mild left foraminal narrowing without likely neural compression. IMPRESSION: Acute superior endplate fracture at L3 with loss of height of approximately 10% anteriorly. No retropulsed bone. Regional edema. This looks like a benign fracture. Ordinary degenerative changes throughout the lumbar region as outlined above without likely neural compression. Electronically Signed   By: Nelson Chimes M.D.   On: 12/13/2017 08:19   Dg Hip Unilat W Or Wo  Pelvis 2-3 Views Right  Result Date: 12/12/2017 CLINICAL DATA:  Right hip pain after unwitnessed fall at home. EXAM: DG HIP (WITH OR WITHOUT PELVIS) 2-3V RIGHT COMPARISON:  05/25/2015 pelvis and right hip radiographs. FINDINGS: There is lower lumbar degenerative disc and facet arthropathy from L4 through S1. No pelvic diastasis. Moderate left and marked right joint space narrowing with spurring is identified about both hips consistent with osteoarthritis. No acute displaced fracture. IMPRESSION: Osteoarthritis of both hips, right worse than left similar to prior. No acute fracture is identified. Rim of osteophytes are noted about the femoral head-neck junctions bilaterally and off the acetabula. Electronically Signed   By: Ashley Royalty M.D.   On: 12/12/2017 20:39    Review of Systems  Constitutional: Negative for chills, fever, malaise/fatigue and weight loss.  Musculoskeletal: Positive for back pain and falls.  Neurological: Negative for tingling, sensory change, focal weakness and weakness.  Psychiatric/Behavioral: Positive for depression.   Blood pressure 121/66, pulse 76, temperature 97.7 F (36.5 C), temperature source Oral, resp. rate 16, height '5\' 3"'  (1.6 m), weight 123 lb 3.8 oz (55.9 kg), SpO2 96 %. Physical Exam  Vitals reviewed. Constitutional: She is oriented to person, place, and time. She appears well-developed.  Adequate nutrition she is small in frame ectomorphic body habitus mild distress from the pain from the back pain  Cardiovascular:  All 4 extremities have normal pulse and perfusion with normal capillary refill she has no peripheral edema  Musculoskeletal:  Right and left upper extremity alignment is normal.  Range of motion assessment normal without contracture.  Motor exam normal 5 out of 5 strength.  Stability no joint subluxations are observed.  Right and left lower extremity normal alignment is again noted.  She has functional range of motion knees hips and ankles.  No  motor weakness is noted and all joints are without subluxation.  Neurological: She is alert and oriented to person, place, and time.  Normal sensation in all 4 extremities, toes are downgoing reflexes x4 are normal elbow and knee and ankle  Her pain is not reproduced with straight leg raises in either leg  Skin: Skin is warm and dry.  Lower legs she has some abrasions on both right and left leg minor mild superficial  Psychiatric: She has a normal mood and affect.   Lumbar spine exam tenderness is noted Approximate L2 and 3 as well as L5-S1 Painful flexion   Assessment/Plan: Reports have been reviewed and are above  my interpretation of the film multilevel degenerative disc disease lumbar spine My interpretation of MRI multilevel degenerative disc disease with mild spinal stenosis throughout the foramina of the lower lumbar spine   Recommend IV steroids oral pain medication consult with interventional radiology for possible vertebroplasty if  not kyphoplasty  Patient done on June 28 dictated on June 29 Arther Abbott 12/14/2017, 8:58 AM

## 2017-12-15 NOTE — Progress Notes (Signed)
Patient had his compression fracture with concomitant spinal stenosis is scheduled for kyphoplasty.  Pain appears reasonably well controlled sitting up in chair no specific complaints Cindy Schultz INO:[id number] DOB: 12/03/1923 DOA: 12/12/2017 PCP: Sinda Du, MD   Physical Exam: Blood pressure 132/77, pulse (!) 46, temperature (!) 97.5 F (36.4 C), temperature source Oral, resp. rate 20, height '5\' 3"'  (1.6 m), weight 55.9 kg (123 lb 3.8 oz), SpO2 97 %.  Lungs clear to A&P no rales wheeze rhonchi heart regular rhythm no murmurs gallops heaves thrills or rubs abdomen soft nontender bowel sounds normoactive   Investigations:  No results found for this or any previous visit (from the past 240 hour(s)).   Basic Metabolic Panel: Recent Labs    12/12/17 2158 12/13/17 0652  NA 141 141  K 3.6 4.5  CL 105 108  CO2 26 25  GLUCOSE 84 128*  BUN 41* 41*  CREATININE 1.78* 1.60*  CALCIUM 9.5 8.7*  MG 2.1  --   PHOS 3.2  --    Liver Function Tests: No results for input(s): AST, ALT, ALKPHOS, BILITOT, PROT, ALBUMIN in the last 72 hours.   CBC: Recent Labs    12/12/17 2158 12/13/17 0652  WBC 12.6* 8.9  NEUTROABS 9.8  --   HGB 12.6 11.7*  HCT 39.1 36.8  MCV 104.5* 104.5*  PLT 188 177    No results found.    Medications:  Impression:  Active Problems:   LOW BACK PAIN   Personal history of fall   HTN (hypertension)   Osteoarthritis of left knee   CKD (chronic kidney disease) stage 4, GFR 15-29 ml/min (HCC)   Primary osteoarthritis of right hip   Back pain     Plan: Continue IV steroids continue analgesia we will await kyphoplasty at [facility].  Be met and CBC scheduled for a.m. Consultants: Orthopedics   Procedures   Antibiotics:            Time spent: 30 minutes  LOS: 2 days   Cindy Schultz M   12/15/2017, 1:09 PM

## 2017-12-16 LAB — CBC WITH DIFFERENTIAL/PLATELET
Basophils Absolute: 0 10*3/uL (ref 0.0–0.1)
Basophils Relative: 0 %
EOS ABS: 0 10*3/uL (ref 0.0–0.7)
Eosinophils Relative: 0 %
HCT: 33.7 % — ABNORMAL LOW (ref 36.0–46.0)
Hemoglobin: 10.8 g/dL — ABNORMAL LOW (ref 12.0–15.0)
LYMPHS ABS: 0.9 10*3/uL (ref 0.7–4.0)
Lymphocytes Relative: 6 %
MCH: 33.5 pg (ref 26.0–34.0)
MCHC: 32 g/dL (ref 30.0–36.0)
MCV: 104.7 fL — ABNORMAL HIGH (ref 78.0–100.0)
MONO ABS: 0.9 10*3/uL (ref 0.1–1.0)
Monocytes Relative: 6 %
NEUTROS PCT: 88 %
Neutro Abs: 12.6 10*3/uL — ABNORMAL HIGH (ref 1.7–7.7)
PLATELETS: 177 10*3/uL (ref 150–400)
RBC: 3.22 MIL/uL — AB (ref 3.87–5.11)
RDW: 15.5 % (ref 11.5–15.5)
WBC: 14.4 10*3/uL — AB (ref 4.0–10.5)

## 2017-12-16 LAB — BASIC METABOLIC PANEL
Anion gap: 7 (ref 5–15)
BUN: 52 mg/dL — AB (ref 8–23)
CALCIUM: 8 mg/dL — AB (ref 8.9–10.3)
CO2: 20 mmol/L — ABNORMAL LOW (ref 22–32)
Chloride: 113 mmol/L — ABNORMAL HIGH (ref 98–111)
Creatinine, Ser: 1.4 mg/dL — ABNORMAL HIGH (ref 0.44–1.00)
GFR, EST AFRICAN AMERICAN: 37 mL/min — AB (ref >60)
GFR, EST NON AFRICAN AMERICAN: 32 mL/min — AB (ref >60)
Glucose, Bld: 158 mg/dL — ABNORMAL HIGH (ref 70–99)
Potassium: 5 mmol/L (ref 3.5–5.1)
SODIUM: 140 mmol/L (ref 135–145)

## 2017-12-16 NOTE — Care Management Important Message (Signed)
Important Message  Patient Details  Name: GLEN BLATCHLEY MRN: [id number] Date of Birth: 31-Jul-1923   Medicare Important Message Given:  Yes    [name] 12/16/2017, 12:02 PM

## 2017-12-16 NOTE — Progress Notes (Signed)
This RN called to [facility] on Friday 6/28, spoke to [place], [place] regarding change in time for pt procedure.Original time scheduled pick up at 815 for procedure at 9; advised Val RN, that the new time will be [facility] link pick up at 11am for procedure at 12pm, given insurance approval, she verbalized understanding.  At approx. 0845 carelink called to IR stating that they were 22mins out with pt. This RN called back to [facility]link to inform them that there had been a time change and also that the insurance was not approved yet. [facility] will transport back to [facility]. Called RN at [facility] this morning and informed him of this.

## 2017-12-16 NOTE — Progress Notes (Signed)
Patient came back to room.  [facility]link placed patient back to bed.

## 2017-12-16 NOTE — Progress Notes (Signed)
Patient ID: Cindy Schultz, female   DOB: 1924/06/09, 84 y.o.   MRN: [id number]   Rescheduled IR procedure to 7/2  New orders in place Npo after MN; to be at [place] 900 am 7/2 via ambulance She will return to [facility] after procedure  RN aware

## 2017-12-16 NOTE — Progress Notes (Signed)
Patient taken to [facility] for procedure via [facility].  No issues stated by patient.

## 2017-12-16 NOTE — Progress Notes (Signed)
Subjective: She is going to go get kyphoplasty today.  She has no new complaints.  She is still having back pain.  Objective: Vital signs in last 24 hours: Temp:  [97.4 F (36.3 C)-98.1 F (36.7 C)] 97.6 F (36.4 C) (07/01 0620) Pulse Rate:  [62-116] 62 (07/01 0620) Resp:  [20] 20 (07/01 0620) BP: (136-148)/(76-87) 142/76 (07/01 0620) SpO2:  [94 %-98 %] 94 % (07/01 0620) Weight change:  Last BM Date: 12/15/17  Intake/Output from previous day: 06/30 0701 - 07/01 0700 In: 1560 [P.O.:660; I.V.:900] Out: 800 [Urine:800]  PHYSICAL EXAM General appearance: alert, cooperative and no distress Resp: clear to auscultation bilaterally Cardio: regular rate and rhythm, S1, S2 normal, no murmur, click, rub or gallop GI: soft, non-tender; bowel sounds normal; no masses,  no organomegaly Extremities: extremities normal, atraumatic, no cyanosis or edema  Lab Results:  Results for orders placed or performed during the hospital encounter of 12/12/17 (from the past 48 hour(s))  Urinalysis, Routine w reflex microscopic     Status: Abnormal   Collection Time: 12/14/17  9:30 PM  Result Value Ref Range   Color, Urine STRAW (A) YELLOW   APPearance CLEAR CLEAR   Specific Gravity, Urine 1.006 1.005 - 1.030   pH 5.0 5.0 - 8.0   Glucose, UA NEGATIVE NEGATIVE mg/dL   Hgb urine dipstick NEGATIVE NEGATIVE   Bilirubin Urine NEGATIVE NEGATIVE   Ketones, ur NEGATIVE NEGATIVE mg/dL   Protein, ur NEGATIVE NEGATIVE mg/dL   Nitrite NEGATIVE NEGATIVE   Leukocytes, UA NEGATIVE NEGATIVE    Comment: Performed at [facility], [address] [place], [place] [address]  Basic metabolic panel     Status: Abnormal   Collection Time: 12/16/17  5:00 AM  Result Value Ref Range   Sodium 140 135 - 145 mmol/L   Potassium 5.0 3.5 - 5.1 mmol/L   Chloride 113 (H) 98 - 111 mmol/L    Comment: Please note change in reference range.   CO2 20 (L) 22 - 32 mmol/L   Glucose, Bld 158 (H) 70 - 99 mg/dL    Comment: Please note  change in reference range.   BUN 52 (H) 8 - 23 mg/dL    Comment: Please note change in reference range.   Creatinine, Ser 1.40 (H) 0.44 - 1.00 mg/dL   Calcium 8.0 (L) 8.9 - 10.3 mg/dL   GFR calc non Af Amer 32 (L) >60 mL/min   GFR calc Af Amer 37 (L) >60 mL/min    Comment: (NOTE) The eGFR has been calculated using the CKD EPI equation. This calculation has not been validated in all clinical situations. eGFR's persistently <60 mL/min signify possible Chronic Kidney Disease.    Anion gap 7 5 - 15    Comment: Performed at [facility], [address] [place], [place] [address]  CBC with Differential/Platelet     Status: Abnormal   Collection Time: 12/16/17  5:00 AM  Result Value Ref Range   WBC 14.4 (H) 4.0 - 10.5 K/uL   RBC 3.22 (L) 3.87 - 5.11 MIL/uL   Hemoglobin 10.8 (L) 12.0 - 15.0 g/dL   HCT 33.7 (L) 36.0 - 46.0 %   MCV 104.7 (H) 78.0 - 100.0 fL   MCH 33.5 26.0 - 34.0 pg   MCHC 32.0 30.0 - 36.0 g/dL   RDW 15.5 11.5 - 15.5 %   Platelets 177 150 - 400 K/uL   Neutrophils Relative % 88 %   Lymphocytes Relative 6 %   Monocytes Relative  6 %   Eosinophils Relative 0 %   Basophils Relative 0 %   Neutro Abs 12.6 (H) 1.7 - 7.7 K/uL   Lymphs Abs 0.9 0.7 - 4.0 K/uL   Monocytes Absolute 0.9 0.1 - 1.0 K/uL   Eosinophils Absolute 0.0 0.0 - 0.7 K/uL   Basophils Absolute 0.0 0.0 - 0.1 K/uL   WBC Morphology WHITE COUNT CONFIRMED ON SMEAR     Comment: Performed at [facility], [address], [place], [place] [address]    ABGS No results for input(s): PHART, PO2ART, TCO2, HCO3 in the last 72 hours.  Invalid input(s): PCO2 CULTURES No results found for this or any previous visit (from the past 240 hour(s)). Studies/Results: No results found.  Medications:  Prior to Admission:  Medications Prior to Admission  Medication Sig Dispense Refill Last Dose  . acetaminophen (TYLENOL) 500 MG tablet Take 500 mg by mouth every 6 (six) hours as needed (pain).    Past Week at Unknown time  .  Calcium 600-200 MG-UNIT per tablet Take 1 tablet by mouth daily.     12/12/2017 at Unknown time  . dorzolamide-timolol (COSOPT) 22.3-6.8 MG/ML ophthalmic solution Place 1 drop into both eyes 2 (two) times daily.     12/12/2017 at Unknown time  . gabapentin (NEURONTIN) 100 MG capsule Take 1 capsule (100 mg total) by mouth 3 (three) times daily. 90 capsule 2 12/12/2017 at Unknown time  . losartan (COZAAR) 50 MG tablet Take 50 mg by mouth daily.    12/11/2017 at Unknown time  . LUMIGAN 0.01 % SOLN Place 1 drop into both eyes at bedtime.    12/11/2017 at Unknown time  . Melatonin 5 MG TABS Take by mouth at bedtime.   12/11/2017 at Unknown time  . Multiple Vitamins-Minerals (PRESERVISION AREDS 2 PO) Take 2 tablets by mouth every morning. One tablet daily    12/12/2017 at Unknown time  . NIFEdipine (PROCARDIA-XL/ADALAT-CC/NIFEDICAL-XL) 30 MG 24 hr tablet Take 30 mg by mouth daily.  11 12/12/2017 at Unknown time  . oxybutynin (DITROPAN-XL) 10 MG 24 hr tablet Take 10 mg by mouth daily.    12/12/2017 at Unknown time  . pantoprazole (PROTONIX) 40 MG tablet Take 40 mg by mouth daily.  12 12/12/2017 at Unknown time  . polyethylene glycol (MIRALAX / GLYCOLAX) packet Take 17 g by mouth daily.   12/12/2017 at Unknown time  . promethazine (PHENERGAN) 12.5 MG tablet Take 1 tablet (12.5 mg total) by mouth every 6 (six) hours as needed for nausea or vomiting. 30 tablet 0 12/11/2017 at Unknown time  . traMADol (ULTRAM) 50 MG tablet Take 1 tablet (50 mg total) by mouth every 6 (six) hours as needed. (Patient taking differently: Take 25-50 mg by mouth every 6 (six) hours as needed. ) 60 tablet 5 12/11/2017 at Unknown time   Scheduled: . calcium-vitamin D  1 tablet Oral Daily  . dorzolamide-timolol  1 drop Both Eyes BID  . gabapentin  200 mg Oral TID  . latanoprost  1 drop Both Eyes QHS  . lidocaine  1 patch Transdermal Q24H  . losartan  50 mg Oral Daily  . methylPREDNISolone (SOLU-MEDROL) injection  40 mg Intravenous Q6H  .  multivitamin-lutein  1 capsule Oral q morning - 10a  . NIFEdipine  30 mg Oral Daily  . oxybutynin  10 mg Oral Daily  . oxyCODONE  10 mg Oral Q12H  . polyethylene glycol  17 g Oral Daily   Continuous: . sodium chloride 75 mL/hr  at 12/16/17 0600   QMG:[id number], promethazine  Assesment: She has low back pain and has a compression fracture in the lumbar spine.  She is a little better with prednisone.  She is going to have kyphoplasty.  She has hypertension which is pretty well controlled  He has arthritis of multiple joints which is stable  She is very hard of hearing so communication is difficult  She has mild cognitive disorder Active Problems:   LOW BACK PAIN   Personal history of fall   HTN (hypertension)   Osteoarthritis of left knee   CKD (chronic kidney disease) stage 4, GFR 15-29 ml/min (HCC)   Primary osteoarthritis of right hip   Back pain    Plan: For kyphoplasty.  After that she will be set up for rehab    LOS: 3 days   Joal Eakle L 12/16/2017, 8:28 AM

## 2017-12-16 NOTE — Progress Notes (Signed)
Physical Therapy Treatment Patient Details Name: Cindy Schultz MRN: [id number] DOB: 08-18-1923 Today's Date: 12/16/2017    History of Present Illness Patient is a 84 year old Caucasian female, lives alone at home, with past medical history significant for low back pain that is currently being worked up by the orthopedic team.  Patient has been on oral prednisone, gabapentin and tramadol for the low back pain.  Other past medical history is as documented below.  Orthopedic team had originally scheduled MRI of the lumbar region for tomorrow.  Unfortunately, the patient fell at home today.  According to the patient, she was leaving the house to go get money from the bank, as well as pick up a medication.  Patient could not confirm if this was a mechanical fall, or if patient passed out and fell.  The patient cannot tell me how long for she was down, but patient activated the life alert.  On presentation to the hospital, the x-ray done on revealing.  MRI is pending.  Lab work is also pending.  No headache, no neck pain, no fever chills, no shortness of breath, no chest pain, no URI symptoms, no GI symptoms and no urinary symptoms.  Hospitalist team has been asked to admit patient for further assessment and management.    PT Comments    Pt sitting on EOB and willing to participate with therapy today.  No reports of pain.  Pt with difficulty hearing and slow processing to answer questions today though able to state name, DOB and knowledgeable about where she is at.    Pt with min A stand pivot transfer to [facility] with multimodal cueing for hand placement for safety and assistance.  Increased distance with gait training with RW for safety, min A and no LOB.  EOS pt left on EOB with bed alarm set and call bell/telephone within reach.  No reports of pain through session.     Follow Up Recommendations  SNF     Equipment Recommendations       Recommendations for Other Services       Precautions /  Restrictions Precautions Precautions: Fall Restrictions Weight Bearing Restrictions: No    Mobility  Bed Mobility               General bed mobility comments: pt sitting on EOB with therapist entrance  Transfers Overall transfer level: Needs assistance Equipment used: Rolling walker (2 wheeled);1 person hand held assist Transfers: Sit to/from Stand Sit to Stand: Min assist         General transfer comment: min A with moderate cueing for hand placement to assist with STS for safety with RW  Ambulation/Gait Ambulation/Gait assistance: Min assist Gait Distance (Feet): 28 Feet Assistive device: Rolling walker (2 wheeled) Gait Pattern/deviations: Decreased step length - right;Step-to pattern;Decreased stride length;Decreased weight shift to right;Shuffle     General Gait Details: slow and labored gait, no LOB.  Moderate cueing to increase stride length to normalize gait mechanics   Stairs             Wheelchair Mobility    Modified Rankin (Stroke Patients Only)       Balance                                            Cognition Arousal/Alertness: Awake/alert Behavior During Therapy: WFL for tasks assessed/performed Overall Cognitive Status: Within Functional Limits  for tasks assessed                                 General Comments: Hearing impaired      Exercises      General Comments        Pertinent Vitals/Pain Pain Assessment: No/denies pain    Home Living                      Prior Function            PT Goals (current goals can now be found in the care plan section)      Frequency    Min 3X/week      PT Plan      Co-evaluation              AM-PAC PT "6 Clicks" Daily Activity  Outcome Measure  Difficulty turning over in bed (including adjusting bedclothes, sheets and blankets)?: A Little Difficulty moving from lying on back to sitting on the side of the bed? :  Unable Difficulty sitting down on and standing up from a chair with arms (e.g., wheelchair, bedside commode, etc,.)?: A Little Help needed moving to and from a bed to chair (including a wheelchair)?: A Little Help needed walking in hospital room?: A Little Help needed climbing 3-5 steps with a railing? : Total 6 Click Score: 14    End of Session Equipment Utilized During Treatment: Gait belt(RW) Activity Tolerance: Patient tolerated treatment well Patient left: in bed;with call bell/phone within reach;with bed alarm set Nurse Communication: Mobility status PT Visit Diagnosis: Unsteadiness on feet (R26.81);Other abnormalities of gait and mobility (R26.89);History of falling (Z91.81);Muscle weakness (generalized) (M62.81)     Time: 9767-3419 PT Time Calculation (min) (ACUTE ONLY): 25 min  Charges:  $Therapeutic Activity: 23-37 mins                    G Codes:       Ihor Austin, LPTA; CBIS [phone]  Aldona Lento 12/16/2017, 2:33 PM

## 2017-12-17 ENCOUNTER — Telehealth: Payer: Self-pay | Admitting: Radiology

## 2017-12-17 ENCOUNTER — Ambulatory Visit (HOSPITAL_COMMUNITY)
Admit: 2017-12-17 | Discharge: 2017-12-17 | Disposition: A | Discharge status: Home / Self Care | Payer: Medicare Other | Source: Ambulatory Visit | Attending: Orthopedic Surgery | Admitting: Orthopedic Surgery

## 2017-12-17 DIAGNOSIS — I1310 Hypertensive heart and chronic kidney disease without heart failure, with stage 1 through stage 4 chronic kidney disease, or unspecified chronic kidney disease: Secondary | ICD-10-CM | POA: Diagnosis not present

## 2017-12-17 DIAGNOSIS — I10 Essential (primary) hypertension: Secondary | ICD-10-CM | POA: Diagnosis not present

## 2017-12-17 DIAGNOSIS — F0391 Unspecified dementia with behavioral disturbance: Secondary | ICD-10-CM | POA: Diagnosis present

## 2017-12-17 DIAGNOSIS — I130 Hypertensive heart and chronic kidney disease with heart failure and stage 1 through stage 4 chronic kidney disease, or unspecified chronic kidney disease: Secondary | ICD-10-CM | POA: Diagnosis not present

## 2017-12-17 DIAGNOSIS — Z86718 Personal history of other venous thrombosis and embolism: Secondary | ICD-10-CM | POA: Insufficient documentation

## 2017-12-17 DIAGNOSIS — M4856XA Collapsed vertebra, not elsewhere classified, lumbar region, initial encounter for fracture: Secondary | ICD-10-CM | POA: Diagnosis not present

## 2017-12-17 DIAGNOSIS — I2510 Atherosclerotic heart disease of native coronary artery without angina pectoris: Secondary | ICD-10-CM

## 2017-12-17 DIAGNOSIS — N184 Chronic kidney disease, stage 4 (severe): Secondary | ICD-10-CM | POA: Diagnosis present

## 2017-12-17 DIAGNOSIS — S32039A Unspecified fracture of third lumbar vertebra, initial encounter for closed fracture: Secondary | ICD-10-CM

## 2017-12-17 DIAGNOSIS — F0390 Unspecified dementia without behavioral disturbance: Secondary | ICD-10-CM | POA: Diagnosis present

## 2017-12-17 DIAGNOSIS — Z85828 Personal history of other malignant neoplasm of skin: Secondary | ICD-10-CM | POA: Diagnosis not present

## 2017-12-17 DIAGNOSIS — L03116 Cellulitis of left lower limb: Secondary | ICD-10-CM | POA: Diagnosis present

## 2017-12-17 DIAGNOSIS — R1312 Dysphagia, oropharyngeal phase: Secondary | ICD-10-CM | POA: Diagnosis not present

## 2017-12-17 DIAGNOSIS — Z7401 Bed confinement status: Secondary | ICD-10-CM | POA: Diagnosis not present

## 2017-12-17 DIAGNOSIS — W19XXXA Unspecified fall, initial encounter: Secondary | ICD-10-CM | POA: Insufficient documentation

## 2017-12-17 DIAGNOSIS — M810 Age-related osteoporosis without current pathological fracture: Secondary | ICD-10-CM | POA: Diagnosis not present

## 2017-12-17 DIAGNOSIS — R627 Adult failure to thrive: Secondary | ICD-10-CM | POA: Diagnosis not present

## 2017-12-17 DIAGNOSIS — I129 Hypertensive chronic kidney disease with stage 1 through stage 4 chronic kidney disease, or unspecified chronic kidney disease: Secondary | ICD-10-CM | POA: Insufficient documentation

## 2017-12-17 DIAGNOSIS — Z9181 History of falling: Secondary | ICD-10-CM | POA: Diagnosis not present

## 2017-12-17 DIAGNOSIS — H44513 Absolute glaucoma, bilateral: Secondary | ICD-10-CM | POA: Diagnosis not present

## 2017-12-17 DIAGNOSIS — Y95 Nosocomial condition: Secondary | ICD-10-CM | POA: Diagnosis present

## 2017-12-17 DIAGNOSIS — R0689 Other abnormalities of breathing: Secondary | ICD-10-CM | POA: Diagnosis not present

## 2017-12-17 DIAGNOSIS — A4189 Other specified sepsis: Secondary | ICD-10-CM | POA: Diagnosis not present

## 2017-12-17 DIAGNOSIS — R2681 Unsteadiness on feet: Secondary | ICD-10-CM | POA: Diagnosis not present

## 2017-12-17 DIAGNOSIS — R410 Disorientation, unspecified: Secondary | ICD-10-CM | POA: Diagnosis not present

## 2017-12-17 DIAGNOSIS — E878 Other disorders of electrolyte and fluid balance, not elsewhere classified: Secondary | ICD-10-CM | POA: Diagnosis present

## 2017-12-17 DIAGNOSIS — H409 Unspecified glaucoma: Secondary | ICD-10-CM

## 2017-12-17 DIAGNOSIS — E871 Hypo-osmolality and hyponatremia: Secondary | ICD-10-CM | POA: Diagnosis present

## 2017-12-17 DIAGNOSIS — S22030A Wedge compression fracture of third thoracic vertebra, initial encounter for closed fracture: Secondary | ICD-10-CM | POA: Diagnosis not present

## 2017-12-17 DIAGNOSIS — Z66 Do not resuscitate: Secondary | ICD-10-CM | POA: Diagnosis present

## 2017-12-17 DIAGNOSIS — R0602 Shortness of breath: Secondary | ICD-10-CM | POA: Diagnosis not present

## 2017-12-17 DIAGNOSIS — R41841 Cognitive communication deficit: Secondary | ICD-10-CM | POA: Diagnosis not present

## 2017-12-17 DIAGNOSIS — F05 Delirium due to known physiological condition: Secondary | ICD-10-CM | POA: Diagnosis present

## 2017-12-17 DIAGNOSIS — Z885 Allergy status to narcotic agent status: Secondary | ICD-10-CM | POA: Insufficient documentation

## 2017-12-17 DIAGNOSIS — N189 Chronic kidney disease, unspecified: Secondary | ICD-10-CM

## 2017-12-17 DIAGNOSIS — R4182 Altered mental status, unspecified: Secondary | ICD-10-CM | POA: Diagnosis not present

## 2017-12-17 DIAGNOSIS — R509 Fever, unspecified: Secondary | ICD-10-CM | POA: Diagnosis not present

## 2017-12-17 DIAGNOSIS — R0902 Hypoxemia: Secondary | ICD-10-CM | POA: Diagnosis present

## 2017-12-17 DIAGNOSIS — H3530 Unspecified macular degeneration: Secondary | ICD-10-CM

## 2017-12-17 DIAGNOSIS — R609 Edema, unspecified: Secondary | ICD-10-CM | POA: Diagnosis not present

## 2017-12-17 DIAGNOSIS — H9190 Unspecified hearing loss, unspecified ear: Secondary | ICD-10-CM | POA: Insufficient documentation

## 2017-12-17 DIAGNOSIS — I340 Nonrheumatic mitral (valve) insufficiency: Secondary | ICD-10-CM | POA: Diagnosis not present

## 2017-12-17 DIAGNOSIS — Z96652 Presence of left artificial knee joint: Secondary | ICD-10-CM | POA: Diagnosis present

## 2017-12-17 DIAGNOSIS — K219 Gastro-esophageal reflux disease without esophagitis: Secondary | ICD-10-CM | POA: Diagnosis present

## 2017-12-17 DIAGNOSIS — R0682 Tachypnea, not elsewhere classified: Secondary | ICD-10-CM | POA: Diagnosis present

## 2017-12-17 DIAGNOSIS — A419 Sepsis, unspecified organism: Secondary | ICD-10-CM | POA: Diagnosis present

## 2017-12-17 DIAGNOSIS — J9811 Atelectasis: Secondary | ICD-10-CM | POA: Diagnosis present

## 2017-12-17 DIAGNOSIS — T148XXA Other injury of unspecified body region, initial encounter: Secondary | ICD-10-CM | POA: Diagnosis not present

## 2017-12-17 DIAGNOSIS — M6281 Muscle weakness (generalized): Secondary | ICD-10-CM | POA: Diagnosis not present

## 2017-12-17 DIAGNOSIS — S32039D Unspecified fracture of third lumbar vertebra, subsequent encounter for fracture with routine healing: Secondary | ICD-10-CM | POA: Diagnosis not present

## 2017-12-17 DIAGNOSIS — G3184 Mild cognitive impairment, so stated: Secondary | ICD-10-CM | POA: Diagnosis not present

## 2017-12-17 DIAGNOSIS — R000 Tachycardia, unspecified: Secondary | ICD-10-CM | POA: Diagnosis present

## 2017-12-17 DIAGNOSIS — M159 Polyosteoarthritis, unspecified: Secondary | ICD-10-CM | POA: Diagnosis not present

## 2017-12-17 DIAGNOSIS — L89151 Pressure ulcer of sacral region, stage 1: Secondary | ICD-10-CM | POA: Diagnosis present

## 2017-12-17 DIAGNOSIS — E860 Dehydration: Secondary | ICD-10-CM | POA: Diagnosis present

## 2017-12-17 DIAGNOSIS — H5789 Other specified disorders of eye and adnexa: Secondary | ICD-10-CM | POA: Diagnosis present

## 2017-12-17 DIAGNOSIS — I6529 Occlusion and stenosis of unspecified carotid artery: Secondary | ICD-10-CM | POA: Insufficient documentation

## 2017-12-17 DIAGNOSIS — M7989 Other specified soft tissue disorders: Secondary | ICD-10-CM | POA: Diagnosis present

## 2017-12-17 DIAGNOSIS — M545 Low back pain: Secondary | ICD-10-CM | POA: Diagnosis not present

## 2017-12-17 DIAGNOSIS — G629 Polyneuropathy, unspecified: Secondary | ICD-10-CM | POA: Diagnosis not present

## 2017-12-17 DIAGNOSIS — R262 Difficulty in walking, not elsewhere classified: Secondary | ICD-10-CM | POA: Diagnosis not present

## 2017-12-17 DIAGNOSIS — J189 Pneumonia, unspecified organism: Secondary | ICD-10-CM | POA: Diagnosis present

## 2017-12-17 DIAGNOSIS — R279 Unspecified lack of coordination: Secondary | ICD-10-CM | POA: Diagnosis not present

## 2017-12-17 HISTORY — PX: IR KYPHO LUMBAR INC FX REDUCE BONE BX UNI/BIL CANNULATION INC/IMAGING: IMG5519

## 2017-12-17 MED ORDER — BUPIVACAINE HCL (PF) 0.5 % IJ SOLN
INTRAMUSCULAR | Status: AC | PRN
  Administered 2017-12-17: 20 mL

## 2017-12-17 MED ORDER — GELATIN ABSORBABLE 12-7 MM EX MISC
CUTANEOUS | Status: AC
  Filled 2017-12-17: qty 1

## 2017-12-17 MED ORDER — OXYCODONE HCL ER 10 MG PO T12A: 10.0000 mg | EXTENDED_RELEASE_TABLET | Freq: Two times a day (BID) | ORAL | Status: DC

## 2017-12-17 MED ORDER — CEFAZOLIN SODIUM-DEXTROSE 2-4 GM/100ML-% IV SOLN
INTRAVENOUS | Status: AC
  Administered 2017-12-17: 1000 mg
  Filled 2017-12-17: qty 100

## 2017-12-17 MED ORDER — TOBRAMYCIN SULFATE 1.2 G IJ SOLR
INTRAMUSCULAR | Status: AC | PRN
  Administered 2017-12-17: .01 g via TOPICAL

## 2017-12-17 MED ORDER — BUPIVACAINE HCL (PF) 0.5 % IJ SOLN
INTRAMUSCULAR | Status: AC
  Filled 2017-12-17: qty 30

## 2017-12-17 MED ORDER — TOBRAMYCIN SULFATE 1.2 G IJ SOLR
INTRAMUSCULAR | Status: AC
  Filled 2017-12-17: qty 1.2

## 2017-12-17 MED ORDER — MIDAZOLAM HCL 2 MG/2ML IJ SOLN
INTRAMUSCULAR | Status: AC
  Filled 2017-12-17: qty 2

## 2017-12-17 MED ORDER — PREDNISONE 10 MG PO TABS: ORAL_TABLET | ORAL | 0 refills | Status: DC

## 2017-12-17 MED ORDER — FENTANYL CITRATE (PF) 100 MCG/2ML IJ SOLN
INTRAMUSCULAR | Status: AC | PRN
  Administered 2017-12-17 (×2): 25 ug via INTRAVENOUS

## 2017-12-17 MED ORDER — FENTANYL CITRATE (PF) 100 MCG/2ML IJ SOLN
INTRAMUSCULAR | Status: AC
  Filled 2017-12-17: qty 2

## 2017-12-17 MED ORDER — GABAPENTIN 100 MG PO CAPS: 200.0000 mg | ORAL_CAPSULE | Freq: Three times a day (TID) | ORAL | Status: DC

## 2017-12-17 MED ORDER — SODIUM CHLORIDE 0.9 % IV SOLN: INTRAVENOUS | Status: AC

## 2017-12-17 MED ORDER — IOPAMIDOL (ISOVUE-300) INJECTION 61%
INTRAVENOUS | Status: AC
  Administered 2017-12-17: 10 mL
  Filled 2017-12-17: qty 50

## 2017-12-17 MED ORDER — MIDAZOLAM HCL 2 MG/2ML IJ SOLN
INTRAMUSCULAR | Status: AC | PRN
  Administered 2017-12-17 (×2): 1 mg via INTRAVENOUS

## 2017-12-17 NOTE — Telephone Encounter (Signed)
I called Cindy Schultz after speaking to Dr [name] between cases and patient does not need a biopsy  Phone number for her [phone]

## 2017-12-17 NOTE — Care Management (Signed)
CM has called CSW at [facility] ED, she will fax DC summary to [place]. Val to call report. CM will complete medical necessity for transport and unit secretary to call for EMS when ready.

## 2017-12-17 NOTE — Progress Notes (Signed)
Discharge summary faxed to [facility] for pt's SNF.   Wendelyn Breslow, [facility]  [phone]

## 2017-12-17 NOTE — Telephone Encounter (Signed)
Patient is having kyphoplasty now, and Caryl Pina at [facility] has asked if patient needs a biopsy. I am unable to reach Dr [name] in Surgery, and this was ordered when patient was in the hospital. I have asked Caryl Pina to let Dr Estanislado Pandy know Dr [name] is is surgery and see if Dr Estanislado Pandy will decide if patient needs the biopsy   To Dr Bill Salinas

## 2017-12-17 NOTE — Sedation Documentation (Signed)
Patient in atrial fibrillation. ECG HR around 110. Because of this ECG HR and pulse Ox HR don't correlate together. MD aware. ECG HR used

## 2017-12-17 NOTE — Clinical Social Work Placement (Signed)
   CLINICAL SOCIAL WORK PLACEMENT  NOTE  Date:  12/17/2017  Patient Details  Name: Cindy Schultz MRN: [id number] Date of Birth: 03-Mar-1924  Clinical Social Work is seeking post-discharge placement for this patient at the [place] level of care (*CSW will initial, date and re-position this form in  chart as items are completed):  Yes   Patient/family provided with [facility] Work Department's list of facilities offering this level of care within the geographic area requested by the patient (or if unable, by the patient's family).  Yes   Patient/family informed of their freedom to choose among providers that offer the needed level of care, that participate in Medicare, Medicaid or managed care program needed by the patient, have an available bed and are willing to accept the patient.  Yes   Patient/family informed of 's ownership interest in [facility] and [facility], as well as of the fact that they are under no obligation to receive care at these facilities.  PASRR submitted to EDS on 12/13/17     PASRR number received on       Existing PASRR number confirmed on 12/13/17     FL2 transmitted to all facilities in geographic area requested by pt/family on 12/13/17     FL2 transmitted to all facilities within larger geographic area on       Patient informed that his/her managed care company has contracts with or will negotiate with certain facilities, including the following:        Yes   Patient/family informed of bed offers received.  Patient chooses bed at [facility]     Physician recommends and patient chooses bed at      Patient to be transferred to [facility] at [place] on 12/16/17.  Patient to be transferred to facility by [facility]     Patient family notified on 12/17/17 of transfer.  Name of family member notified:  [name], Daughter     PHYSICIAN       Additional Comment: Pt will dc to [facility] for short  term rehab later today. Updated Debbie at [employer]. Will update pt's RN who will call report and EMS when pt ready. DC clinical will be sent through the [facility]. No other CSW needs for dc.   _______________________________________________ Shade Flood, LCSW 12/17/2017, 11:06 AM

## 2017-12-17 NOTE — Procedures (Signed)
S/P L3 balloon KP   

## 2017-12-17 NOTE — Consult Note (Signed)
Chief Complaint: Patient was seen in consultation today for L3 compression fracutre  Referring Physician(s): Carole Civil  Supervising Physician: Luanne Bras  Patient Status: [facility]- inpt  History of Present Illness: Cindy Schultz is a 84 y.o. female with past medical history of HTN, diverticulitis, CAD, CKD, and sinus arrhythmia who presented to [facility] ED with back pain.  Patient states she has had several falls recently which have worsened her back pain.  Her most recent fall was 6/27 and she has not been able to mobilize due to pain.  IR consulted for vertebroplasty/kyphoplasty at the request of Dr. [name].  She presents to [facility] radiology today for procedure.   Case reviewed by Dr. Estanislado Pandy who approves patient for procedure.  She has been NPO.   Past Medical History:  Diagnosis Date  . Cancer [facility] March 2016   Skin Cancer :  Right Cheek     SCC  . Cancer [facility]) 2013   Skin Cancer  :  Anterior Neck   SCC  . Carotid artery occlusion   . Chronic kidney disease   . Diverticulitis   . DVT (deep venous thrombosis) ([place]) 07/14/10   LEA doppler   . GI bleeding    2013/2016/2018: suspected diverticular bleeding  . Glaucoma   . Hearing loss   . HTN (hypertension)    cholesterol  . Lumbar spine pain   . Lymphedema   . Macular degeneration   . Pain in joints   . Precancerous changes of the cervix    lesions  . Rectocele   . SOB (shortness of breath) 04/04/2010   2D echo EF=>55%    Past Surgical History:  Procedure Laterality Date  . ABDOMINAL HYSTERECTOMY    . appendix    . bladder tac    . COLONOSCOPY  09/20/2011   Dr. Carol Ada: pandiverticulosis, hemorrhoids. ascending colon polyp not removed in setting of active bleeding.  [facility] Kitchen COLONOSCOPY N/A 04/10/2017   Dr. [name]: three 4-6 mm polyps in ascending colon and cecum, s/p removal. tubular adenomas. diverticulosis in entire examined colon.   . corneal erosion     cataracts   . elevated metatarsal arch     . fallen uterus    . hemmorrhoidectomy    . left total knee replacement  05/02/04   [name]  . met osteostomy    . morton's nueroma    . OVARIAN CYST SURGERY    . RECTOCELE REPAIR    . rupture rt groin    . salk    . SKIN CANCER EXCISION Right March 2016  2013   Cheek -  [facility]  and  Anterior Neck  . tracheotomy      Allergies: Codeine; Aspirin; Pepto-bismol [bismuth subsalicylate]; and Hydrocodone  Medications: Prior to Admission medications   Medication Sig Start Date End Date Taking? Authorizing Provider  acetaminophen (TYLENOL) 500 MG tablet Take 500 mg by mouth every 6 (six) hours as needed (pain).     [provider]  Calcium 600-200 MG-UNIT per tablet Take 1 tablet by mouth daily.      [provider]  dorzolamide-timolol (COSOPT) 22.3-6.8 MG/ML ophthalmic solution Place 1 drop into both eyes 2 (two) times daily.      [provider]  gabapentin (NEURONTIN) 100 MG capsule Take 1 capsule (100 mg total) by mouth 3 (three) times daily. 12/09/17   Carole Civil, MD  gabapentin (NEURONTIN) 100 MG capsule Take 2 capsules (200 mg total) by mouth 3 (three) times  daily. 12/17/17   Sinda Du, MD  losartan (COZAAR) 50 MG tablet Take 50 mg by mouth daily.  12/25/14   [provider]  LUMIGAN 0.01 % SOLN Place 1 drop into both eyes at bedtime.  03/06/17   [provider]  Melatonin 5 MG TABS Take by mouth at bedtime.    [provider]  Multiple Vitamins-Minerals (PRESERVISION AREDS 2 PO) Take 2 tablets by mouth every morning. One tablet daily     [provider]  NIFEdipine (PROCARDIA-XL/ADALAT-CC/NIFEDICAL-XL) 30 MG 24 hr tablet Take 30 mg by mouth daily. 11/24/17   [provider]  oxybutynin (DITROPAN-XL) 10 MG 24 hr tablet Take 10 mg by mouth daily.     [provider]  oxyCODONE (OXYCONTIN) 10 mg 12 hr tablet Take 1 tablet (10 mg total) by mouth every 12 (twelve) hours. 12/17/17   Sinda Du, MD    pantoprazole (PROTONIX) 40 MG tablet Take 40 mg by mouth daily. 11/25/17   [provider]  polyethylene glycol (MIRALAX / GLYCOLAX) packet Take 17 g by mouth daily.    [provider]  predniSONE (DELTASONE) 10 MG tablet 40 mg daily x3 days, 30 mg daily x3 days, 20 mg daily x3 days, then 10 mg daily x3 days and stop 12/17/17   Sinda Du, MD  promethazine (PHENERGAN) 12.5 MG tablet Take 1 tablet (12.5 mg total) by mouth every 6 (six) hours as needed for nausea or vomiting. 10/21/17   Carole Civil, MD  traMADol (ULTRAM) 50 MG tablet Take 1 tablet (50 mg total) by mouth every 6 (six) hours as needed. Patient taking differently: Take 25-50 mg by mouth every 6 (six) hours as needed.  10/21/17   Carole Civil, MD     Family History  Problem Relation Age of Onset  . Stroke Mother   . Diabetes Mother   . Heart attack Father   . COPD Paternal Grandfather   . Dementia Sister   . Colon cancer Neg Hx     Social History   Socioeconomic History  . Marital status: Divorced    Spouse name: Not on file  . Number of children: Not on file  . Years of education: Not on file  . Highest education level: Not on file  Occupational History  . Not on file  Social Needs  . Financial resource strain: Not on file  . Food insecurity:    Worry: Not on file    Inability: Not on file  . Transportation needs:    Medical: Not on file    Non-medical: Not on file  Tobacco Use  . Smoking status: Never Smoker  . Smokeless tobacco: Never Used  Substance and Sexual Activity  . Alcohol use: No  . Drug use: No  . Sexual activity: Never  Lifestyle  . Physical activity:    Days per week: Not on file    Minutes per session: Not on file  . Stress: Not on file  Relationships  . Social connections:    Talks on phone: Not on file    Gets together: Not on file    Attends religious service: Not on file    Active member of club or organization: Not on file    Attends meetings of  clubs or organizations: Not on file    Relationship status: Not on file  Other Topics Concern  . Not on file  Social History Narrative  . Not on file     Review  of Systems: A 12 point ROS discussed and pertinent positives are indicated in the HPI above.  All other systems are negative.  Review of Systems  Constitutional: Negative for fatigue and fever.  Respiratory: Negative for cough and shortness of breath.   Cardiovascular: Negative for chest pain.  Gastrointestinal: Negative for abdominal pain.  Musculoskeletal: Positive for back pain.  Psychiatric/Behavioral: Negative for behavioral problems and confusion.    Vital Signs: There were no vitals taken for this visit.  Physical Exam  Constitutional: She is oriented to person, place, and time. She appears well-developed.  Cardiovascular: Normal rate. Exam reveals no gallop and no friction rub.  No murmur heard. Irregular rhythm  Pulmonary/Chest: Effort normal and breath sounds normal. No respiratory distress.  Abdominal: Soft. She exhibits no distension. There is no tenderness.  Musculoskeletal:  No point tenderness on exam today, however on pain medication and lidocaine patch in place.  Pain reproducible with movement.   Neurological: She is alert and oriented to person, place, and time.  Skin: Skin is warm and dry.  Psychiatric: She has a normal mood and affect. Her behavior is normal. Judgment and thought content normal.  Nursing note and vitals reviewed.    MD Evaluation Airway: WNL Heart: WNL Abdomen: WNL Chest/ Lungs: WNL ASA  Classification: 3 Mallampati/Airway Score: One   Imaging: Dg Lumbar Spine Complete  Result Date: 12/12/2017 CLINICAL DATA:  Low back pain.  Unwitnessed fall. EXAM: LUMBAR SPINE - COMPLETE 4+ VIEW COMPARISON:  08/28/2017 CT abdomen/pelvis. FINDINGS: This report assumes 5 non rib-bearing lumbar vertebrae. Lumbar vertebral body heights are preserved, with no fracture. Moderate multilevel  lumbar degenerative disc disease. No spondylolisthesis. Moderate bilateral lower lumbar facet arthropathy. No aggressive appearing focal osseous lesions. Abdominal aortic atherosclerosis. IMPRESSION: 1. No lumbar spine fracture or spondylolisthesis. 2. Moderate multilevel lumbar degenerative changes as detailed. Electronically Signed   By: Ilona Sorrel M.D.   On: 12/12/2017 20:33   Mr Lumbar Spine Wo Contrast  Result Date: 12/13/2017 CLINICAL DATA:  [place] yesterday.  Back pain.  Negative radiography. EXAM: MRI LUMBAR SPINE WITHOUT CONTRAST TECHNIQUE: Multiplanar, multisequence MR imaging of the lumbar spine was performed. No intravenous contrast was administered. COMPARISON:  Radiography 12/12/2017 FINDINGS: Segmentation:  5 lumbar type vertebral bodies. Alignment:  Mild curvature convex to the left with the apex at L3. Vertebrae: Acute superior endplate fracture at L3 with less than 10% loss of height anteriorly. Mild bone marrow edema. No retropulsed bone. No traumatic disc herniation. No sign of pre-existing pathologic bone lesion. No other regional fracture or bone lesion. Conus medullaris and cauda equina: Conus extends to the L1 level. Conus and cauda equina appear normal. Paraspinal and other soft tissues: Negative Disc levels: Non-compressive disc bulges at L2-3 and above. L3-4: Moderate disc bulge. Mild facet and ligamentous hypertrophy. Mild narrowing of the right lateral recess and intervertebral foramen on the right but without distinct neural compression. L4-5: Moderate bulging of the disc. Mild facet and ligamentous hypertrophy. Mild narrowing of both lateral recesses but without compressive stenosis. L5-S1: Mild disc bulge. Facet degeneration. Mild left foraminal narrowing without likely neural compression. IMPRESSION: Acute superior endplate fracture at L3 with loss of height of approximately 10% anteriorly. No retropulsed bone. Regional edema. This looks like a benign fracture. Ordinary  degenerative changes throughout the lumbar region as outlined above without likely neural compression. Electronically Signed   By: Nelson Chimes M.D.   On: 12/13/2017 08:19   Dg Hip Unilat W Or Wo Pelvis 2-3 Views Right  Result Date: 12/12/2017 CLINICAL DATA:  Right hip pain after unwitnessed fall at home. EXAM: DG HIP (WITH OR WITHOUT PELVIS) 2-3V RIGHT COMPARISON:  05/25/2015 pelvis and right hip radiographs. FINDINGS: There is lower lumbar degenerative disc and facet arthropathy from L4 through S1. No pelvic diastasis. Moderate left and marked right joint space narrowing with spurring is identified about both hips consistent with osteoarthritis. No acute displaced fracture. IMPRESSION: Osteoarthritis of both hips, right worse than left similar to prior. No acute fracture is identified. Rim of osteophytes are noted about the femoral head-neck junctions bilaterally and off the acetabula. Electronically Signed   By: Ashley Royalty M.D.   On: 12/12/2017 20:39    Labs:  CBC: Recent Labs    08/29/17 0419 12/12/17 2158 12/13/17 0652 12/16/17 0500  WBC 6.5 12.6* 8.9 14.4*  HGB 10.5* 12.6 11.7* 10.8*  HCT 33.6* 39.1 36.8 33.7*  PLT 204 188 177 177    COAGS: Recent Labs    12/13/17 1042  INR 1.07    BMP: Recent Labs    08/29/17 0419 12/12/17 2158 12/13/17 0652 12/16/17 0500  NA 140 141 141 140  K 4.2 3.6 4.5 5.0  CL 109 105 108 113*  CO2 _0 20*  GLUCOSE 90 84 128* 158*  BUN 23* 41* 41* 52*  CALCIUM 8.6* 9.5 8.7* 8.0*  CREATININE 1.33* 1.78* 1.60* 1.40*  GFRNONAA 34* 24* 27* 32*  GFRAA 39* 28* 31* 37*    LIVER FUNCTION TESTS: Recent Labs    08/28/17 1729  BILITOT 0.7  AST 20  ALT 19  ALKPHOS 83  PROT 6.5  ALBUMIN 3.5    TUMOR MARKERS: No results for input(s): AFPTM, CEA, CA199, CHROMGRNA in the last 8760 hours.  Assessment and Plan: L3 Compression fracture Patient presents to radiology department today for kyphoplasty/vertebroplasty.  Imaging reviewed by  Dr. Estanislado Pandy who approves patient for procedure.  She has been NPO.  She did have recent labwork showing INR WNL, and is not currently on thinners.  She also had UA 6/28 which was WNL.  Her SCr is elevated at 1.4 today, however this is somewhat improved from her SCr on admission.   Risks and benefits were discussed with the patient including, but not limited to education regarding the natural healing process of compression fractures without intervention, bleeding, infection, cement migration which may cause spinal cord damage, paralysis, pulmonary embolism or even death.  This interventional procedure involves the use of X-rays and because of the nature of the planned procedure, it is possible that we will have prolonged use of X-ray fluoroscopy.  Potential radiation risks to you include (but are not limited to) the following: - A slightly elevated risk for cancer several years later in life. This risk is typically less than 0.5% percent. This risk is low in comparison to the normal incidence of human cancer, which is 33% for women and 50% for men according to the [employer]. - Radiation induced injury can include skin redness, resembling a rash, tissue breakdown / ulcers and hair loss (which can be temporary or permanent).   The likelihood of either of these occurring depends on the difficulty of the procedure and whether you are sensitive to radiation due to previous procedures, disease, or genetic conditions.   IF your procedure requires a prolonged use of radiation, you will be notified and given written instructions for further action.  It is your responsibility to monitor the irradiated area for the 2 weeks following the  procedure and to notify your physician if you are concerned that you have suffered a radiation induced injury.    All of the patient's questions were answered, patient is agreeable to proceed.  Consent signed and in chart.  Thank you for this interesting  consult.  I greatly enjoyed meeting Cindy Schultz and look forward to participating in their care.  A copy of this report was sent to the requesting provider on this date.  Electronically Signed: Docia Barrier, PA 12/17/2017, 10:23 AM   I spent a total of 40 Minutes    in face to face in clinical consultation, greater than 50% of which was counseling/coordinating care for compression fracture.

## 2017-12-17 NOTE — Discharge Instructions (Signed)
1. No stooping ,bending or lifting more than than 10 lbs for 2weeks. 2.Use walker ambulate. 3.No driving for 2 weeks. 4.RTC PRN 2 weeks

## 2017-12-17 NOTE — Progress Notes (Signed)
Dr [name] was unavailable to tell IR if biopsy of lumbar 3 vertebrae was necessary during patients kyphoplasty due to being in middle of surgery.  Dr [name] told Amy Littrell Rt-R to leave biopsy to Dr Estanislado Pandy to decide if necessary.  Dr Estanislado Pandy decided to do biopsy and report will be sent to Dr [name].  [employer] Rt-R

## 2017-12-17 NOTE — Progress Notes (Signed)
Patient being discharged to [facility] facility. Report called and given to Lurline Del LPN. Family at bedside. [facility] to transport patient to awaiting facility.

## 2017-12-17 NOTE — Progress Notes (Signed)
Subjective: She still complains of back pain.  There was trouble with insurance approval of her procedure yesterday so she did not have it done but she is scheduled for today.  She has no other new complaints.  Objective: Vital signs in last 24 hours: Temp:  [97.5 F (36.4 C)-97.7 F (36.5 C)] 97.5 F (36.4 C) (07/02 0628) Pulse Rate:  [82-98] 98 (07/02 0628) Resp:  [16-18] 17 (07/02 0628) BP: (133-154)/(66-98) 154/98 (07/02 0628) SpO2:  [93 %-95 %] 93 % (07/02 0628) Weight change:  Last BM Date: 12/15/17  Intake/Output from previous day: 07/01 0701 - 07/02 0700 In: 480 [P.O.:480] Out: 1300 [Urine:1300]  PHYSICAL EXAM General appearance: alert, cooperative and no distress Resp: clear to auscultation bilaterally Cardio: regular rate and rhythm, S1, S2 normal, no murmur, click, rub or gallop GI: soft, non-tender; bowel sounds normal; no masses,  no organomegaly Extremities: extremities normal, atraumatic, no cyanosis or edema  Lab Results:  Results for orders placed or performed during the hospital encounter of 12/12/17 (from the past 48 hour(s))  Basic metabolic panel     Status: Abnormal   Collection Time: 12/16/17  5:00 AM  Result Value Ref Range   Sodium 140 135 - 145 mmol/L   Potassium 5.0 3.5 - 5.1 mmol/L   Chloride 113 (H) 98 - 111 mmol/L    Comment: Please note change in reference range.   CO2 20 (L) 22 - 32 mmol/L   Glucose, Bld 158 (H) 70 - 99 mg/dL    Comment: Please note change in reference range.   BUN 52 (H) 8 - 23 mg/dL    Comment: Please note change in reference range.   Creatinine, Ser 1.40 (H) 0.44 - 1.00 mg/dL   Calcium 8.0 (L) 8.9 - 10.3 mg/dL   GFR calc non Af Amer 32 (L) >60 mL/min   GFR calc Af Amer 37 (L) >60 mL/min    Comment: (NOTE) The eGFR has been calculated using the CKD EPI equation. This calculation has not been validated in all clinical situations. eGFR's persistently <60 mL/min signify possible Chronic Kidney Disease.    Anion gap 7  5 - 15    Comment: Performed at [facility], [address], [place], [place] [address]  CBC with Differential/Platelet     Status: Abnormal   Collection Time: 12/16/17  5:00 AM  Result Value Ref Range   WBC 14.4 (H) 4.0 - 10.5 K/uL   RBC 3.22 (L) 3.87 - 5.11 MIL/uL   Hemoglobin 10.8 (L) 12.0 - 15.0 g/dL   HCT 33.7 (L) 36.0 - 46.0 %   MCV 104.7 (H) 78.0 - 100.0 fL   MCH 33.5 26.0 - 34.0 pg   MCHC 32.0 30.0 - 36.0 g/dL   RDW 15.5 11.5 - 15.5 %   Platelets 177 150 - 400 K/uL   Neutrophils Relative % 88 %   Lymphocytes Relative 6 %   Monocytes Relative 6 %   Eosinophils Relative 0 %   Basophils Relative 0 %   Neutro Abs 12.6 (H) 1.7 - 7.7 K/uL   Lymphs Abs 0.9 0.7 - 4.0 K/uL   Monocytes Absolute 0.9 0.1 - 1.0 K/uL   Eosinophils Absolute 0.0 0.0 - 0.7 K/uL   Basophils Absolute 0.0 0.0 - 0.1 K/uL   WBC Morphology WHITE COUNT CONFIRMED ON SMEAR     Comment: Performed at [facility], [address], [place], [place] [address]    ABGS No results for input(s): PHART, PO2ART, TCO2, HCO3 in  the last 72 hours.  Invalid input(s): PCO2 CULTURES No results found for this or any previous visit (from the past 240 hour(s)). Studies/Results: No results found.  Medications:  Prior to Admission:  Medications Prior to Admission  Medication Sig Dispense Refill Last Dose  . acetaminophen (TYLENOL) 500 MG tablet Take 500 mg by mouth every 6 (six) hours as needed (pain).    Past Week at Unknown time  . Calcium 600-200 MG-UNIT per tablet Take 1 tablet by mouth daily.     12/12/2017 at Unknown time  . dorzolamide-timolol (COSOPT) 22.3-6.8 MG/ML ophthalmic solution Place 1 drop into both eyes 2 (two) times daily.     12/12/2017 at Unknown time  . gabapentin (NEURONTIN) 100 MG capsule Take 1 capsule (100 mg total) by mouth 3 (three) times daily. 90 capsule 2 12/12/2017 at Unknown time  . losartan (COZAAR) 50 MG tablet Take 50 mg by mouth daily.    12/11/2017 at Unknown time  . LUMIGAN 0.01 % SOLN Place 1  drop into both eyes at bedtime.    12/11/2017 at Unknown time  . Melatonin 5 MG TABS Take by mouth at bedtime.   12/11/2017 at Unknown time  . Multiple Vitamins-Minerals (PRESERVISION AREDS 2 PO) Take 2 tablets by mouth every morning. One tablet daily    12/12/2017 at Unknown time  . NIFEdipine (PROCARDIA-XL/ADALAT-CC/NIFEDICAL-XL) 30 MG 24 hr tablet Take 30 mg by mouth daily.  11 12/12/2017 at Unknown time  . oxybutynin (DITROPAN-XL) 10 MG 24 hr tablet Take 10 mg by mouth daily.    12/12/2017 at Unknown time  . pantoprazole (PROTONIX) 40 MG tablet Take 40 mg by mouth daily.  12 12/12/2017 at Unknown time  . polyethylene glycol (MIRALAX / GLYCOLAX) packet Take 17 g by mouth daily.   12/12/2017 at Unknown time  . promethazine (PHENERGAN) 12.5 MG tablet Take 1 tablet (12.5 mg total) by mouth every 6 (six) hours as needed for nausea or vomiting. 30 tablet 0 12/11/2017 at Unknown time  . traMADol (ULTRAM) 50 MG tablet Take 1 tablet (50 mg total) by mouth every 6 (six) hours as needed. (Patient taking differently: Take 25-50 mg by mouth every 6 (six) hours as needed. ) 60 tablet 5 12/11/2017 at Unknown time   Scheduled: . calcium-vitamin D  1 tablet Oral Daily  . dorzolamide-timolol  1 drop Both Eyes BID  . gabapentin  200 mg Oral TID  . latanoprost  1 drop Both Eyes QHS  . lidocaine  1 patch Transdermal Q24H  . losartan  50 mg Oral Daily  . methylPREDNISolone (SOLU-MEDROL) injection  40 mg Intravenous Q6H  . multivitamin-lutein  1 capsule Oral q morning - 10a  . NIFEdipine  30 mg Oral Daily  . oxybutynin  10 mg Oral Daily  . oxyCODONE  10 mg Oral Q12H  . polyethylene glycol  17 g Oral Daily   Continuous: . sodium chloride 75 mL/hr at 12/17/17 0531   ZOX:[id number], promethazine  Assesment: She was admitted with low back pain and she has a compression fracture in the lumbar spine.  She had back pain before that from degenerative disease.  She did have a fall.  She has arthritis of multiple  joints.  This is about the same  She has chronic kidney disease stage IV stable  She has mild cognitive impairment stable  She has diminished hearing which is stable  She has hypertension which is fairly well controlled Active Problems:   LOW BACK PAIN   Personal  history of fall   HTN (hypertension)   Osteoarthritis of left knee   CKD (chronic kidney disease) stage 4, GFR 15-29 ml/min (HCC)   Primary osteoarthritis of right hip   Back pain    Plan: Continue treatments.  For procedure today    LOS: 4 days   Leontae Bostock L 12/17/2017, 8:05 AM

## 2017-12-17 NOTE — Discharge Summary (Signed)
Physician Discharge Summary  Patient ID: CHANIYAH JAHR MRN: [id number] DOB/AGE: [birth year] 84 y.o. Primary Care Physician:Keilyn Nadal, Percell Miller, MD Admit date: 12/12/2017 Discharge date: 12/17/2017    Discharge Diagnoses:   Active Problems:   LOW BACK PAIN   Personal history of fall   HTN (hypertension)   Osteoarthritis of left knee   CKD (chronic kidney disease) stage 4, GFR 15-29 ml/min (HCC)   Primary osteoarthritis of right hip   Back pain   Compression fracture of lumbar spine, non-traumatic, initial encounter (HCC) Mild cognitive impairment  Allergies as of 12/17/2017      Reactions   Codeine Nausea And Vomiting   Aspirin    Cannot take due to Diverticulosis (bleeding side effects)   Pepto-bismol [bismuth Subsalicylate]    Black stools   Hydrocodone Other (See Comments)   Unknown Reaction       Medication List    TAKE these medications   acetaminophen 500 MG tablet Commonly known as:  TYLENOL Take 500 mg by mouth every 6 (six) hours as needed (pain).   Calcium 600-200 MG-UNIT tablet Take 1 tablet by mouth daily.   dorzolamide-timolol 22.3-6.8 MG/ML ophthalmic solution Commonly known as:  COSOPT Place 1 drop into both eyes 2 (two) times daily.   gabapentin 100 MG capsule Commonly known as:  NEURONTIN Take 2 capsules (200 mg total) by mouth 3 (three) times daily. What changed:  how much to take   losartan 50 MG tablet Commonly known as:  COZAAR Take 50 mg by mouth daily.   LUMIGAN 0.01 % Soln Generic drug:  bimatoprost Place 1 drop into both eyes at bedtime.   Melatonin 5 MG Tabs Take by mouth at bedtime.   NIFEdipine 30 MG 24 hr tablet Commonly known as:  PROCARDIA-XL/ADALAT-CC/NIFEDICAL-XL Take 30 mg by mouth daily.   oxybutynin 10 MG 24 hr tablet Commonly known as:  DITROPAN-XL Take 10 mg by mouth daily.   oxyCODONE 10 mg 12 hr tablet Commonly known as:  OXYCONTIN Take 1 tablet (10 mg total) by mouth every 12 (twelve) hours.   pantoprazole 40  MG tablet Commonly known as:  PROTONIX Take 40 mg by mouth daily.   polyethylene glycol packet Commonly known as:  MIRALAX / GLYCOLAX Take 17 g by mouth daily.   predniSONE 10 MG tablet Commonly known as:  DELTASONE 40 mg daily x3 days, 30 mg daily x3 days, 20 mg daily x3 days, then 10 mg daily x3 days and stop   PRESERVISION AREDS 2 PO Take 2 tablets by mouth every morning. One tablet daily   promethazine 12.5 MG tablet Commonly known as:  PHENERGAN Take 1 tablet (12.5 mg total) by mouth every 6 (six) hours as needed for nausea or vomiting.   traMADol 50 MG tablet Commonly known as:  ULTRAM Take 1 tablet (50 mg total) by mouth every 6 (six) hours as needed. What changed:  how much to take       Discharged Condition: Improved    Consults: Orthopedics, Dr. [name]  Significant Diagnostic Studies: Dg Lumbar Spine Complete  Result Date: 12/12/2017 CLINICAL DATA:  Low back pain.  Unwitnessed fall. EXAM: LUMBAR SPINE - COMPLETE 4+ VIEW COMPARISON:  08/28/2017 CT abdomen/pelvis. FINDINGS: This report assumes 5 non rib-bearing lumbar vertebrae. Lumbar vertebral body heights are preserved, with no fracture. Moderate multilevel lumbar degenerative disc disease. No spondylolisthesis. Moderate bilateral lower lumbar facet arthropathy. No aggressive appearing focal osseous lesions. Abdominal aortic atherosclerosis. IMPRESSION: 1. No lumbar spine fracture or spondylolisthesis. 2.  Moderate multilevel lumbar degenerative changes as detailed. Electronically Signed   By: Ilona Sorrel M.D.   On: 12/12/2017 20:33   Mr Lumbar Spine Wo Contrast  Result Date: 12/13/2017 CLINICAL DATA:  [place] yesterday.  Back pain.  Negative radiography. EXAM: MRI LUMBAR SPINE WITHOUT CONTRAST TECHNIQUE: Multiplanar, multisequence MR imaging of the lumbar spine was performed. No intravenous contrast was administered. COMPARISON:  Radiography 12/12/2017 FINDINGS: Segmentation:  5 lumbar type vertebral bodies.  Alignment:  Mild curvature convex to the left with the apex at L3. Vertebrae: Acute superior endplate fracture at L3 with less than 10% loss of height anteriorly. Mild bone marrow edema. No retropulsed bone. No traumatic disc herniation. No sign of pre-existing pathologic bone lesion. No other regional fracture or bone lesion. Conus medullaris and cauda equina: Conus extends to the L1 level. Conus and cauda equina appear normal. Paraspinal and other soft tissues: Negative Disc levels: Non-compressive disc bulges at L2-3 and above. L3-4: Moderate disc bulge. Mild facet and ligamentous hypertrophy. Mild narrowing of the right lateral recess and intervertebral foramen on the right but without distinct neural compression. L4-5: Moderate bulging of the disc. Mild facet and ligamentous hypertrophy. Mild narrowing of both lateral recesses but without compressive stenosis. L5-S1: Mild disc bulge. Facet degeneration. Mild left foraminal narrowing without likely neural compression. IMPRESSION: Acute superior endplate fracture at L3 with loss of height of approximately 10% anteriorly. No retropulsed bone. Regional edema. This looks like a benign fracture. Ordinary degenerative changes throughout the lumbar region as outlined above without likely neural compression. Electronically Signed   By: Nelson Chimes M.D.   On: 12/13/2017 08:19   Dg Hip Unilat W Or Wo Pelvis 2-3 Views Right  Result Date: 12/12/2017 CLINICAL DATA:  Right hip pain after unwitnessed fall at home. EXAM: DG HIP (WITH OR WITHOUT PELVIS) 2-3V RIGHT COMPARISON:  05/25/2015 pelvis and right hip radiographs. FINDINGS: There is lower lumbar degenerative disc and facet arthropathy from L4 through S1. No pelvic diastasis. Moderate left and marked right joint space narrowing with spurring is identified about both hips consistent with osteoarthritis. No acute displaced fracture. IMPRESSION: Osteoarthritis of both hips, right worse than left similar to prior. No  acute fracture is identified. Rim of osteophytes are noted about the femoral head-neck junctions bilaterally and off the acetabula. Electronically Signed   By: Ashley Royalty M.D.   On: 12/12/2017 20:39    Lab Results: Basic Metabolic Panel: Recent Labs    12/16/17 0500  NA 140  K 5.0  CL 113*  CO2 20*  GLUCOSE 158*  BUN 52*  CREATININE 1.40*  CALCIUM 8.0*   Liver Function Tests: No results for input(s): AST, ALT, ALKPHOS, BILITOT, PROT, ALBUMIN in the last 72 hours.   CBC: Recent Labs    12/16/17 0500  WBC 14.4*  NEUTROABS 12.6*  HGB 10.8*  HCT 33.7*  MCV 104.7*  PLT 177    No results found for this or any previous visit (from the past 240 hour(s)).   Hospital Course: This is a 84 year old who had been having back pain previously and who fell at home when she bent over to pick something up and lost her balance.  She had increasing back pain at that time.  She was found to have compression fracture in the lumbar spine on MRI.  She is a good candidate for kyphoplasty so she is going to have that done.  She was felt to be a candidate for skilled care facility placement and she is  going to go to the skilled care facility after her kyphoplasty is completed.  She is done well on steroids and it has helped her pain.  Discharge Exam: Blood pressure (!) 154/98, pulse 98, temperature (!) 97.5 F (36.4 C), temperature source Oral, resp. rate 17, height 5\' 3"  (1.6 m), weight 55.9 kg (123 lb 3.8 oz), SpO2 93 %. She is awake and alert.  She is hard of hearing.  Her chest is clear.  Disposition: To skilled care facility.  She will have speech as needed PT and OT.  She will start off on OxyContin 10 mg twice daily for 5 days and that can probably be discontinued after that as she should have improvement in her pain from the kyphoplasty.  She will have tramadol available for acute pain.    Contact information for after-discharge care    [facility] .    Service:  Skilled Nursing Contact information: [address] [place] [phone]              Signed: Alonza Bogus   12/17/2017, 8:27 AM

## 2017-12-17 NOTE — Sedation Documentation (Signed)
Patient breathing through mouth. CO2 sensor reading erroneous and spontaneous

## 2017-12-18 ENCOUNTER — Encounter (HOSPITAL_COMMUNITY): Payer: Self-pay | Admitting: Interventional Radiology

## 2017-12-23 DIAGNOSIS — R609 Edema, unspecified: Secondary | ICD-10-CM | POA: Diagnosis not present

## 2017-12-23 DIAGNOSIS — I1310 Hypertensive heart and chronic kidney disease without heart failure, with stage 1 through stage 4 chronic kidney disease, or unspecified chronic kidney disease: Secondary | ICD-10-CM | POA: Diagnosis not present

## 2017-12-23 DIAGNOSIS — M545 Low back pain: Secondary | ICD-10-CM | POA: Diagnosis not present

## 2017-12-23 DIAGNOSIS — T148XXA Other injury of unspecified body region, initial encounter: Secondary | ICD-10-CM | POA: Diagnosis not present

## 2017-12-25 ENCOUNTER — Encounter (INDEPENDENT_AMBULATORY_CARE_PROVIDER_SITE_OTHER): Payer: Medicare Other | Admitting: Ophthalmology

## 2017-12-25 DIAGNOSIS — G629 Polyneuropathy, unspecified: Secondary | ICD-10-CM | POA: Diagnosis not present

## 2017-12-25 DIAGNOSIS — T148XXA Other injury of unspecified body region, initial encounter: Secondary | ICD-10-CM | POA: Diagnosis not present

## 2017-12-30 ENCOUNTER — Emergency Department (HOSPITAL_COMMUNITY): Payer: Medicare Other

## 2017-12-30 ENCOUNTER — Encounter (HOSPITAL_COMMUNITY): Payer: Self-pay | Admitting: Emergency Medicine

## 2017-12-30 ENCOUNTER — Inpatient Hospital Stay (HOSPITAL_COMMUNITY)
Admission: EM | Admit: 2017-12-30 | Discharge: 2018-01-06 | DRG: 871 | Disposition: A | Discharge status: Skilled Nursing Facility | Payer: Medicare Other | Attending: Pulmonary Disease | Admitting: Pulmonary Disease

## 2017-12-30 ENCOUNTER — Other Ambulatory Visit: Payer: Self-pay

## 2017-12-30 DIAGNOSIS — M7989 Other specified soft tissue disorders: Secondary | ICD-10-CM | POA: Diagnosis present

## 2017-12-30 DIAGNOSIS — Z79899 Other long term (current) drug therapy: Secondary | ICD-10-CM

## 2017-12-30 DIAGNOSIS — S5002XA Contusion of left elbow, initial encounter: Secondary | ICD-10-CM | POA: Diagnosis present

## 2017-12-30 DIAGNOSIS — R0602 Shortness of breath: Secondary | ICD-10-CM | POA: Diagnosis not present

## 2017-12-30 DIAGNOSIS — I129 Hypertensive chronic kidney disease with stage 1 through stage 4 chronic kidney disease, or unspecified chronic kidney disease: Secondary | ICD-10-CM | POA: Diagnosis present

## 2017-12-30 DIAGNOSIS — I340 Nonrheumatic mitral (valve) insufficiency: Secondary | ICD-10-CM | POA: Diagnosis not present

## 2017-12-30 DIAGNOSIS — R41841 Cognitive communication deficit: Secondary | ICD-10-CM | POA: Diagnosis not present

## 2017-12-30 DIAGNOSIS — G3184 Mild cognitive impairment, so stated: Secondary | ICD-10-CM | POA: Diagnosis not present

## 2017-12-30 DIAGNOSIS — M25511 Pain in right shoulder: Secondary | ICD-10-CM | POA: Diagnosis not present

## 2017-12-30 DIAGNOSIS — Z886 Allergy status to analgesic agent status: Secondary | ICD-10-CM

## 2017-12-30 DIAGNOSIS — N184 Chronic kidney disease, stage 4 (severe): Secondary | ICD-10-CM | POA: Diagnosis present

## 2017-12-30 DIAGNOSIS — Z66 Do not resuscitate: Secondary | ICD-10-CM | POA: Diagnosis present

## 2017-12-30 DIAGNOSIS — H5789 Other specified disorders of eye and adnexa: Secondary | ICD-10-CM | POA: Diagnosis present

## 2017-12-30 DIAGNOSIS — R296 Repeated falls: Secondary | ICD-10-CM | POA: Diagnosis present

## 2017-12-30 DIAGNOSIS — Z8249 Family history of ischemic heart disease and other diseases of the circulatory system: Secondary | ICD-10-CM

## 2017-12-30 DIAGNOSIS — A419 Sepsis, unspecified organism: Secondary | ICD-10-CM | POA: Diagnosis present

## 2017-12-30 DIAGNOSIS — F0391 Unspecified dementia with behavioral disturbance: Secondary | ICD-10-CM | POA: Diagnosis present

## 2017-12-30 DIAGNOSIS — I130 Hypertensive heart and chronic kidney disease with heart failure and stage 1 through stage 4 chronic kidney disease, or unspecified chronic kidney disease: Secondary | ICD-10-CM | POA: Diagnosis not present

## 2017-12-30 DIAGNOSIS — R000 Tachycardia, unspecified: Secondary | ICD-10-CM | POA: Diagnosis not present

## 2017-12-30 DIAGNOSIS — R0902 Hypoxemia: Secondary | ICD-10-CM | POA: Diagnosis not present

## 2017-12-30 DIAGNOSIS — H409 Unspecified glaucoma: Secondary | ICD-10-CM | POA: Diagnosis present

## 2017-12-30 DIAGNOSIS — R531 Weakness: Secondary | ICD-10-CM | POA: Diagnosis not present

## 2017-12-30 DIAGNOSIS — I10 Essential (primary) hypertension: Secondary | ICD-10-CM | POA: Diagnosis present

## 2017-12-30 DIAGNOSIS — E878 Other disorders of electrolyte and fluid balance, not elsewhere classified: Secondary | ICD-10-CM | POA: Diagnosis present

## 2017-12-30 DIAGNOSIS — K219 Gastro-esophageal reflux disease without esophagitis: Secondary | ICD-10-CM | POA: Diagnosis not present

## 2017-12-30 DIAGNOSIS — L89151 Pressure ulcer of sacral region, stage 1: Secondary | ICD-10-CM | POA: Diagnosis present

## 2017-12-30 DIAGNOSIS — E8770 Fluid overload, unspecified: Secondary | ICD-10-CM | POA: Diagnosis not present

## 2017-12-30 DIAGNOSIS — R0682 Tachypnea, not elsewhere classified: Secondary | ICD-10-CM | POA: Diagnosis present

## 2017-12-30 DIAGNOSIS — R2681 Unsteadiness on feet: Secondary | ICD-10-CM | POA: Diagnosis not present

## 2017-12-30 DIAGNOSIS — M810 Age-related osteoporosis without current pathological fracture: Secondary | ICD-10-CM | POA: Diagnosis present

## 2017-12-30 DIAGNOSIS — H9190 Unspecified hearing loss, unspecified ear: Secondary | ICD-10-CM | POA: Diagnosis present

## 2017-12-30 DIAGNOSIS — F0390 Unspecified dementia without behavioral disturbance: Secondary | ICD-10-CM | POA: Diagnosis present

## 2017-12-30 DIAGNOSIS — Z885 Allergy status to narcotic agent status: Secondary | ICD-10-CM

## 2017-12-30 DIAGNOSIS — H44513 Absolute glaucoma, bilateral: Secondary | ICD-10-CM | POA: Diagnosis not present

## 2017-12-30 DIAGNOSIS — R509 Fever, unspecified: Secondary | ICD-10-CM

## 2017-12-30 DIAGNOSIS — J9811 Atelectasis: Secondary | ICD-10-CM | POA: Diagnosis not present

## 2017-12-30 DIAGNOSIS — Y95 Nosocomial condition: Secondary | ICD-10-CM | POA: Diagnosis present

## 2017-12-30 DIAGNOSIS — E871 Hypo-osmolality and hyponatremia: Secondary | ICD-10-CM | POA: Diagnosis present

## 2017-12-30 DIAGNOSIS — R1312 Dysphagia, oropharyngeal phase: Secondary | ICD-10-CM | POA: Diagnosis not present

## 2017-12-30 DIAGNOSIS — J90 Pleural effusion, not elsewhere classified: Secondary | ICD-10-CM | POA: Diagnosis not present

## 2017-12-30 DIAGNOSIS — Z888 Allergy status to other drugs, medicaments and biological substances status: Secondary | ICD-10-CM

## 2017-12-30 DIAGNOSIS — R4182 Altered mental status, unspecified: Secondary | ICD-10-CM | POA: Diagnosis not present

## 2017-12-30 DIAGNOSIS — R410 Disorientation, unspecified: Secondary | ICD-10-CM | POA: Diagnosis not present

## 2017-12-30 DIAGNOSIS — Z96652 Presence of left artificial knee joint: Secondary | ICD-10-CM | POA: Diagnosis present

## 2017-12-30 DIAGNOSIS — L03116 Cellulitis of left lower limb: Secondary | ICD-10-CM | POA: Diagnosis not present

## 2017-12-30 DIAGNOSIS — M6281 Muscle weakness (generalized): Secondary | ICD-10-CM | POA: Diagnosis not present

## 2017-12-30 DIAGNOSIS — L8990 Pressure ulcer of unspecified site, unspecified stage: Secondary | ICD-10-CM

## 2017-12-30 DIAGNOSIS — R262 Difficulty in walking, not elsewhere classified: Secondary | ICD-10-CM | POA: Diagnosis not present

## 2017-12-30 DIAGNOSIS — F05 Delirium due to known physiological condition: Secondary | ICD-10-CM | POA: Diagnosis present

## 2017-12-30 DIAGNOSIS — J189 Pneumonia, unspecified organism: Secondary | ICD-10-CM | POA: Diagnosis present

## 2017-12-30 DIAGNOSIS — R627 Adult failure to thrive: Secondary | ICD-10-CM | POA: Diagnosis not present

## 2017-12-30 DIAGNOSIS — Z90710 Acquired absence of both cervix and uterus: Secondary | ICD-10-CM

## 2017-12-30 DIAGNOSIS — S5001XA Contusion of right elbow, initial encounter: Secondary | ICD-10-CM | POA: Diagnosis present

## 2017-12-30 DIAGNOSIS — M545 Low back pain: Secondary | ICD-10-CM | POA: Diagnosis not present

## 2017-12-30 DIAGNOSIS — Z79890 Hormone replacement therapy (postmenopausal): Secondary | ICD-10-CM

## 2017-12-30 DIAGNOSIS — R0689 Other abnormalities of breathing: Secondary | ICD-10-CM | POA: Diagnosis not present

## 2017-12-30 DIAGNOSIS — Z85828 Personal history of other malignant neoplasm of skin: Secondary | ICD-10-CM | POA: Diagnosis not present

## 2017-12-30 DIAGNOSIS — Z9181 History of falling: Secondary | ICD-10-CM | POA: Diagnosis not present

## 2017-12-30 DIAGNOSIS — L89890 Pressure ulcer of other site, unstageable: Secondary | ICD-10-CM | POA: Diagnosis present

## 2017-12-30 DIAGNOSIS — E860 Dehydration: Secondary | ICD-10-CM | POA: Diagnosis present

## 2017-12-30 DIAGNOSIS — M159 Polyosteoarthritis, unspecified: Secondary | ICD-10-CM | POA: Diagnosis not present

## 2017-12-30 DIAGNOSIS — A4189 Other specified sepsis: Secondary | ICD-10-CM | POA: Diagnosis not present

## 2017-12-30 DIAGNOSIS — Z7401 Bed confinement status: Secondary | ICD-10-CM | POA: Diagnosis not present

## 2017-12-30 DIAGNOSIS — H3530 Unspecified macular degeneration: Secondary | ICD-10-CM | POA: Diagnosis present

## 2017-12-30 DIAGNOSIS — S32039D Unspecified fracture of third lumbar vertebra, subsequent encounter for fracture with routine healing: Secondary | ICD-10-CM | POA: Diagnosis not present

## 2017-12-30 LAB — URINALYSIS, ROUTINE W REFLEX MICROSCOPIC
BILIRUBIN URINE: NEGATIVE
Glucose, UA: NEGATIVE mg/dL
Hgb urine dipstick: NEGATIVE
KETONES UR: NEGATIVE mg/dL
LEUKOCYTES UA: NEGATIVE
NITRITE: NEGATIVE
Protein, ur: NEGATIVE mg/dL
Specific Gravity, Urine: 1.013 (ref 1.005–1.030)
pH: 5 (ref 5.0–8.0)

## 2017-12-30 LAB — CBC WITH DIFFERENTIAL/PLATELET
Basophils Absolute: 0 10*3/uL (ref 0.0–0.1)
Basophils Relative: 0 %
Eosinophils Absolute: 0.1 10*3/uL (ref 0.0–0.7)
Eosinophils Relative: 1 %
HCT: 34.7 % — ABNORMAL LOW (ref 36.0–46.0)
HEMOGLOBIN: 11.3 g/dL — AB (ref 12.0–15.0)
LYMPHS ABS: 1.5 10*3/uL (ref 0.7–4.0)
Lymphocytes Relative: 15 %
MCH: 33.1 pg (ref 26.0–34.0)
MCHC: 32.6 g/dL (ref 30.0–36.0)
MCV: 101.8 fL — ABNORMAL HIGH (ref 78.0–100.0)
MONOS PCT: 8 %
Monocytes Absolute: 0.8 10*3/uL (ref 0.1–1.0)
Neutro Abs: 7.3 10*3/uL (ref 1.7–7.7)
Neutrophils Relative %: 76 %
Platelets: 138 10*3/uL — ABNORMAL LOW (ref 150–400)
RBC: 3.41 MIL/uL — AB (ref 3.87–5.11)
RDW: 15.3 % (ref 11.5–15.5)
WBC: 9.7 10*3/uL (ref 4.0–10.5)

## 2017-12-30 LAB — COMPREHENSIVE METABOLIC PANEL
ALK PHOS: 72 U/L (ref 38–126)
ALT: 16 U/L (ref 0–44)
ANION GAP: 9 (ref 5–15)
AST: 20 U/L (ref 15–41)
Albumin: 2.4 g/dL — ABNORMAL LOW (ref 3.5–5.0)
BILIRUBIN TOTAL: 0.6 mg/dL (ref 0.3–1.2)
BUN: 42 mg/dL — AB (ref 8–23)
CALCIUM: 8.7 mg/dL — AB (ref 8.9–10.3)
CO2: 28 mmol/L (ref 22–32)
Chloride: 97 mmol/L — ABNORMAL LOW (ref 98–111)
Creatinine, Ser: 1.78 mg/dL — ABNORMAL HIGH (ref 0.44–1.00)
GFR calc Af Amer: 28 mL/min — ABNORMAL LOW (ref >60)
GFR, EST NON AFRICAN AMERICAN: 24 mL/min — AB (ref >60)
Glucose, Bld: 108 mg/dL — ABNORMAL HIGH (ref 70–99)
POTASSIUM: 5.1 mmol/L (ref 3.5–5.1)
SODIUM: 134 mmol/L — AB (ref 135–145)
TOTAL PROTEIN: 5.4 g/dL — AB (ref 6.5–8.1)

## 2017-12-30 LAB — PROTIME-INR
INR: 1.05
Prothrombin Time: 13.6 seconds (ref 11.4–15.2)

## 2017-12-30 LAB — PHOSPHORUS: Phosphorus: 3.6 mg/dL (ref 2.5–4.6)

## 2017-12-30 LAB — LACTIC ACID, PLASMA
LACTIC ACID, VENOUS: 1.3 mmol/L (ref 0.5–1.9)
Lactic Acid, Venous: 1.3 mmol/L (ref 0.5–1.9)

## 2017-12-30 LAB — MRSA PCR SCREENING: MRSA BY PCR: POSITIVE — AB

## 2017-12-30 LAB — MAGNESIUM: MAGNESIUM: 1.9 mg/dL (ref 1.7–2.4)

## 2017-12-30 MED ORDER — IPRATROPIUM-ALBUTEROL 0.5-2.5 (3) MG/3ML IN SOLN: 3.0000 mL | Freq: Four times a day (QID) | RESPIRATORY_TRACT | Status: DC | PRN

## 2017-12-30 MED ORDER — POLYETHYLENE GLYCOL 3350 17 G PO PACK
17.0000 g | PACK | Freq: Every day | ORAL | Status: DC
  Administered 2017-12-30 – 2018-01-06 (×8): 17 g via ORAL
  Filled 2017-12-30 (×8): qty 1

## 2017-12-30 MED ORDER — ACETAMINOPHEN 325 MG PO TABS
650.0000 mg | ORAL_TABLET | Freq: Once | ORAL | Status: AC
  Administered 2017-12-30: 650 mg via ORAL
  Filled 2017-12-30: qty 2

## 2017-12-30 MED ORDER — TIMOLOL MALEATE 0.5 % OP SOLN
1.0000 [drp] | Freq: Two times a day (BID) | OPHTHALMIC | Status: DC
  Administered 2017-12-30 – 2018-01-06 (×14): 1 [drp] via OPHTHALMIC
  Filled 2017-12-30: qty 5

## 2017-12-30 MED ORDER — SODIUM CHLORIDE 0.9 % IV SOLN
2.0000 g | Freq: Once | INTRAVENOUS | Status: AC
  Administered 2017-12-30: 2 g via INTRAVENOUS
  Filled 2017-12-30: qty 2

## 2017-12-30 MED ORDER — CALCIUM CARBONATE-VITAMIN D 500-200 MG-UNIT PO TABS
1.0000 | ORAL_TABLET | Freq: Every day | ORAL | Status: DC
  Administered 2017-12-30 – 2018-01-06 (×8): 1 via ORAL
  Filled 2017-12-30 (×8): qty 1

## 2017-12-30 MED ORDER — VANCOMYCIN HCL IN DEXTROSE 1-5 GM/200ML-% IV SOLN
1000.0000 mg | Freq: Once | INTRAVENOUS | Status: AC
  Administered 2017-12-30: 1000 mg via INTRAVENOUS
  Filled 2017-12-30: qty 200

## 2017-12-30 MED ORDER — DORZOLAMIDE HCL 2 % OP SOLN
1.0000 [drp] | Freq: Two times a day (BID) | OPHTHALMIC | Status: DC
  Administered 2017-12-30 – 2018-01-06 (×14): 1 [drp] via OPHTHALMIC
  Filled 2017-12-30: qty 10

## 2017-12-30 MED ORDER — GABAPENTIN 100 MG PO CAPS
200.0000 mg | ORAL_CAPSULE | Freq: Three times a day (TID) | ORAL | Status: DC
  Administered 2017-12-30 – 2018-01-05 (×15): 200 mg via ORAL
  Filled 2017-12-30 (×16): qty 2

## 2017-12-30 MED ORDER — LATANOPROST 0.005 % OP SOLN
1.0000 [drp] | Freq: Every day | OPHTHALMIC | Status: DC
Start: 2017-12-30 — End: 2018-01-06
  Administered 2017-12-30 – 2018-01-05 (×7): 1 [drp] via OPHTHALMIC
  Filled 2017-12-30: qty 2.5

## 2017-12-30 MED ORDER — HEPARIN SODIUM (PORCINE) 5000 UNIT/ML IJ SOLN
5000.0000 [IU] | Freq: Three times a day (TID) | INTRAMUSCULAR | Status: DC
  Administered 2017-12-30 – 2018-01-06 (×21): 5000 [IU] via SUBCUTANEOUS
  Filled 2017-12-30 (×21): qty 1

## 2017-12-30 MED ORDER — NIFEDIPINE ER OSMOTIC RELEASE 30 MG PO TB24
30.0000 mg | ORAL_TABLET | Freq: Every day | ORAL | Status: DC
  Administered 2017-12-30 – 2018-01-06 (×8): 30 mg via ORAL
  Filled 2017-12-30 (×8): qty 1

## 2017-12-30 MED ORDER — DOCUSATE SODIUM 100 MG PO CAPS
100.0000 mg | ORAL_CAPSULE | Freq: Every day | ORAL | Status: DC
  Administered 2017-12-30 – 2018-01-06 (×8): 100 mg via ORAL
  Filled 2017-12-30 (×8): qty 1

## 2017-12-30 MED ORDER — PANTOPRAZOLE SODIUM 40 MG PO TBEC
40.0000 mg | DELAYED_RELEASE_TABLET | Freq: Every day | ORAL | Status: DC
  Administered 2017-12-30 – 2018-01-06 (×8): 40 mg via ORAL
  Filled 2017-12-30 (×8): qty 1

## 2017-12-30 MED ORDER — SODIUM CHLORIDE 0.9 % IV SOLN
INTRAVENOUS | Status: AC
  Administered 2017-12-30: 18:00:00 via INTRAVENOUS

## 2017-12-30 MED ORDER — HALOPERIDOL LACTATE 5 MG/ML IJ SOLN
1.0000 mg | Freq: Once | INTRAMUSCULAR | Status: AC
  Administered 2017-12-30: 1 mg via INTRAVENOUS
  Filled 2017-12-30: qty 1

## 2017-12-30 MED ORDER — ACETAMINOPHEN 500 MG PO TABS: 500.0000 mg | ORAL_TABLET | Freq: Four times a day (QID) | ORAL | Status: DC | PRN

## 2017-12-30 MED ORDER — OCUVITE-LUTEIN PO CAPS
1.0000 | ORAL_CAPSULE | Freq: Every morning | ORAL | Status: DC
  Administered 2017-12-31 – 2018-01-06 (×7): 1 via ORAL
  Filled 2017-12-30 (×7): qty 1

## 2017-12-30 MED ORDER — TRAMADOL HCL 50 MG PO TABS
50.0000 mg | ORAL_TABLET | Freq: Three times a day (TID) | ORAL | Status: DC | PRN
  Administered 2018-01-02: 50 mg via ORAL
  Filled 2017-12-30 (×3): qty 1

## 2017-12-30 MED ORDER — FENTANYL CITRATE (PF) 100 MCG/2ML IJ SOLN
50.0000 ug | Freq: Once | INTRAMUSCULAR | Status: AC
  Administered 2017-12-30: 50 ug via INTRAVENOUS
  Filled 2017-12-30: qty 2

## 2017-12-30 MED ORDER — SODIUM CHLORIDE 0.9 % IV BOLUS
1000.0000 mL | Freq: Once | INTRAVENOUS | Status: AC
  Administered 2017-12-30: 1000 mL via INTRAVENOUS

## 2017-12-30 MED ORDER — SODIUM CHLORIDE 0.9 % IV SOLN
2.0000 g | INTRAVENOUS | Status: DC
  Filled 2017-12-30: qty 2

## 2017-12-30 MED ORDER — DORZOLAMIDE HCL-TIMOLOL MAL 2-0.5 % OP SOLN: 1.0000 [drp] | Freq: Two times a day (BID) | OPHTHALMIC | Status: DC

## 2017-12-30 MED ORDER — VANCOMYCIN HCL IN DEXTROSE 1-5 GM/200ML-% IV SOLN
1000.0000 mg | INTRAVENOUS | Status: DC
  Administered 2018-01-03 – 2018-01-05 (×2): 1000 mg via INTRAVENOUS
  Filled 2017-12-30 (×4): qty 200

## 2017-12-30 NOTE — ED Provider Notes (Signed)
[facility] EMERGENCY DEPARTMENT Provider Note   CSN: [id number] Arrival date & time: 12/30/17  0857     History   Chief Complaint Chief Complaint  Patient presents with  . Altered Mental Status    HPI Cindy Schultz is a 84 y.o. female.  HPI   84 year old female sent for evaluation of change in mental status.  As of few weeks ago patient was living independently at home.  She fell and sustained some compression fractures.  She underwent kyphoplasty on 7/2 and was discharged to a skilled nursing facility.  She has struggled to return to her baseline since that time.  She has been dealing with some issues with wounds to her lower extremities, peripheral edema deconditioning and some increased confusion.  Her mental status acutely changed in the past 24 hours.  Her daughter noticed that she was having some difficulty feeding herself last night.  This morning she was very tremulous mumbling and just generally seemed ill.  She was transferred the emergency room for further evaluation.  Currently she is stating that she does not feel good but is unable to provide much useful history otherwise.  Her daughter is at bedside and has a good grasp of her past history and medical care.  She has very realistic expectations.  She has a most form with her.  She is DNR/DNI.  She would want intravenous fluids and antibiotics as indicated though.  Past Medical History:  Diagnosis Date  . Cancer [facility]) March 2016   Skin Cancer :  Right Cheek     SCC  . Cancer [facility]) 2013   Skin Cancer  :  Anterior Neck   SCC  . Carotid artery occlusion   . Chronic kidney disease   . Diverticulitis   . DVT (deep venous thrombosis) ([place]) 07/14/10   LEA doppler   . GI bleeding    2013/2016/2018: suspected diverticular bleeding  . Glaucoma   . Hearing loss   . HTN (hypertension)    cholesterol  . Lumbar spine pain   . Lymphedema   . Macular degeneration   . Pain in joints   . Precancerous changes of the cervix      lesions  . Rectocele   . SOB (shortness of breath) 04/04/2010   2D echo EF=>55%    Patient Active Problem List   Diagnosis Date Noted  . Compression fracture of lumbar spine, non-traumatic, initial encounter ([place]) 12/17/2017  . Back pain 12/12/2017  . Abdominal pain 08/28/2017  . Primary osteoarthritis of right hip 07/19/2017  . Tear of right rotator cuff 07/19/2017  . CKD (chronic kidney disease) stage 3, GFR 30-59 ml/min (HCC) 05/22/2017  . Macrocytosis   . Diverticulosis of large intestine with hemorrhage   . Rectal bleed 05/19/2017  . CKD (chronic kidney disease) stage 4, GFR 15-29 ml/min (HCC) 05/19/2017  . LLQ pain 03/08/2017  . Colon polyp 03/08/2017  . Constipation 03/08/2017  . Acute blood loss anemia 09/10/2014  . GIB (gastrointestinal bleeding) 09/08/2014  . Occlusion and stenosis of carotid artery without mention of cerebral infarction 12/14/2013  . Osteoarthritis of left knee 04/23/2013  . Thumb pain 10/22/2012  . De Quervain's syndrome (tenosynovitis) 10/22/2012  . [facility] DJD(carpometacarpal degenerative joint disease), localized primary 10/22/2012  . CTS (carpal tunnel syndrome) 10/22/2012  . Trigger thumb of right hand 10/22/2012  . Pes anserinus bursitis of right knee 06/26/2012  . OA (osteoarthritis) of knee 06/26/2012  . Leg pain, left 06/26/2012  . Radicular pain  of left lower extremity 06/26/2012  . Diverticulosis of colon with hemorrhage 09/22/2011  . Syncopal episodes 09/20/2011  . GI bleed 09/19/2011  . HTN (hypertension) 09/19/2011  . Anemia due to blood loss, acute 09/19/2011  . Abnormality of gait 01/03/2011  . Personal history of fall 12/27/2010  . DISLOCATION CLOSED SHOULDER NEC 04/25/2010  . ANSERINE BURSITIS, LEFT 02/13/2010  . KNEE PAIN 12/22/2009  . LEG PAIN, BILATERAL 12/22/2009  . TOTAL KNEE FOLLOW-UP 12/22/2009  . HIP PAIN 02/07/2009  . LOW BACK PAIN 02/07/2009    Past Surgical History:  Procedure Laterality Date  . ABDOMINAL  HYSTERECTOMY    . appendix    . bladder tac    . COLONOSCOPY  09/20/2011   Dr. Carol Ada: pandiverticulosis, hemorrhoids. ascending colon polyp not removed in setting of active bleeding.  Marland Kitchen COLONOSCOPY N/A 04/10/2017   Dr. [name]: three 4-6 mm polyps in ascending colon and cecum, s/p removal. tubular adenomas. diverticulosis in entire examined colon.   . corneal erosion     cataracts   . elevated metatarsal arch    . fallen uterus    . hemmorrhoidectomy    . IR KYPHO LUMBAR INC FX REDUCE BONE BX UNI/BIL CANNULATION INC/IMAGING  12/17/2017  . left total knee replacement  05/02/04   [name]  . met osteostomy    . morton's nueroma    . OVARIAN CYST SURGERY    . RECTOCELE REPAIR    . rupture rt groin    . salk    . SKIN CANCER EXCISION Right March 2016  2013   Cheek -  [facility]  and  Anterior Neck  . tracheotomy       OB History   None      Home Medications    Prior to Admission medications   Medication Sig Start Date End Date Taking? Authorizing Provider  acetaminophen (TYLENOL) 500 MG tablet Take 500 mg by mouth every 6 (six) hours as needed (pain).     [provider]  Calcium 600-200 MG-UNIT per tablet Take 1 tablet by mouth daily.      [provider]  dorzolamide-timolol (COSOPT) 22.3-6.8 MG/ML ophthalmic solution Place 1 drop into both eyes 2 (two) times daily.      [provider]  gabapentin (NEURONTIN) 100 MG capsule Take 1 capsule (100 mg total) by mouth 3 (three) times daily. 12/09/17   Carole Civil, MD  gabapentin (NEURONTIN) 100 MG capsule Take 2 capsules (200 mg total) by mouth 3 (three) times daily. 12/17/17   Sinda Du, MD  losartan (COZAAR) 50 MG tablet Take 50 mg by mouth daily.  12/25/14   [provider]  LUMIGAN 0.01 % SOLN Place 1 drop into both eyes at bedtime.  03/06/17   [provider]  Melatonin 5 MG TABS Take by mouth at bedtime.    [provider]  Multiple Vitamins-Minerals (PRESERVISION  AREDS 2 PO) Take 2 tablets by mouth every morning. One tablet daily     [provider]  NIFEdipine (PROCARDIA-XL/ADALAT-CC/NIFEDICAL-XL) 30 MG 24 hr tablet Take 30 mg by mouth daily. 11/24/17   [provider]  oxybutynin (DITROPAN-XL) 10 MG 24 hr tablet Take 10 mg by mouth daily.     [provider]  oxyCODONE (OXYCONTIN) 10 mg 12 hr tablet Take 1 tablet (10 mg total) by mouth every 12 (twelve) hours. 12/17/17   Sinda Du, MD  pantoprazole (PROTONIX) 40 MG tablet Take 40 mg by mouth daily. 11/25/17  [provider]  polyethylene glycol (MIRALAX / GLYCOLAX) packet Take 17 g by mouth daily.    [provider]  predniSONE (DELTASONE) 10 MG tablet 40 mg daily x3 days, 30 mg daily x3 days, 20 mg daily x3 days, then 10 mg daily x3 days and stop 12/17/17   Sinda Du, MD  promethazine (PHENERGAN) 12.5 MG tablet Take 1 tablet (12.5 mg total) by mouth every 6 (six) hours as needed for nausea or vomiting. 10/21/17   Carole Civil, MD  traMADol (ULTRAM) 50 MG tablet Take 1 tablet (50 mg total) by mouth every 6 (six) hours as needed. Patient taking differently: Take 25-50 mg by mouth every 6 (six) hours as needed.  10/21/17   Carole Civil, MD    Family History Family History  Problem Relation Age of Onset  . Stroke Mother   . Diabetes Mother   . Heart attack Father   . COPD Paternal Grandfather   . Dementia Sister   . Colon cancer Neg Hx     Social History Social History   Tobacco Use  . Smoking status: Never Smoker  . Smokeless tobacco: Never Used  Substance Use Topics  . Alcohol use: No  . Drug use: No     Allergies   Codeine; Aspirin; Pepto-bismol [bismuth subsalicylate]; and Hydrocodone   Review of Systems Review of Systems  Level 5 caveat because of confusion.  Physical Exam Updated Vital Signs BP (!) 138/106 (BP Location: Right Arm)   Pulse (!) 125   Temp (!) 100.8 F (38.2 C) (Rectal)   Resp (!) 30   Ht '5\' 5"'   (1.651 m)   Wt 55.8 kg (123 lb)   SpO2 (!) 84%   BMI 20.47 kg/m   Physical Exam  Constitutional: She appears well-developed and well-nourished. No distress.  HENT:  Head: Normocephalic and atraumatic.  Eyes: Conjunctivae are normal. Right eye exhibits no discharge. Left eye exhibits no discharge.  Neck: Neck supple.  Cardiovascular: Normal rate, regular rhythm and normal heart sounds. Exam reveals no gallop and no friction rub.  No murmur heard. Pulmonary/Chest: Effort normal and breath sounds normal. No respiratory distress.  Abdominal: Soft. She exhibits no distension. There is no tenderness.  Musculoskeletal: She exhibits no edema or tenderness.  Multiple wounds to bilateral lower extremities with developing cellulitis of left lower extremity..  Neurological:  Disoriented to time  Skin: Skin is warm and dry.  Psychiatric: She has a normal mood and affect. Her behavior is normal. Thought content normal.  Nursing note and vitals reviewed.    ED Treatments / Results  Labs (all labs ordered are listed, but only abnormal results are displayed) Labs Reviewed  MRSA PCR SCREENING - Abnormal; Notable for the following components:      Result Value   MRSA by PCR POSITIVE (*)    All other components within normal limits  COMPREHENSIVE METABOLIC PANEL - Abnormal; Notable for the following components:   Sodium 134 (*)    Chloride 97 (*)    Glucose, Bld 108 (*)    BUN 42 (*)    Creatinine, Ser 1.78 (*)    Calcium 8.7 (*)    Total Protein 5.4 (*)    Albumin 2.4 (*)    GFR calc non Af Amer 24 (*)    GFR calc Af Amer 28 (*)    All other components within normal limits  CBC WITH DIFFERENTIAL/PLATELET - Abnormal; Notable for the following components:   RBC 3.41 (*)  Hemoglobin 11.3 (*)    HCT 34.7 (*)    MCV 101.8 (*)    Platelets 138 (*)    All other components within normal limits  URINALYSIS, ROUTINE W REFLEX MICROSCOPIC - Abnormal; Notable for the following components:    APPearance HAZY (*)    All other components within normal limits  BASIC METABOLIC PANEL - Abnormal; Notable for the following components:   BUN 36 (*)    Creatinine, Ser 1.57 (*)    Calcium 8.5 (*)    GFR calc non Af Amer 28 (*)    GFR calc Af Amer 32 (*)    All other components within normal limits  CBC - Abnormal; Notable for the following components:   WBC 12.3 (*)    RBC 3.27 (*)    Hemoglobin 10.8 (*)    HCT 33.4 (*)    MCV 102.1 (*)    Platelets 125 (*)    All other components within normal limits  CREATININE, SERUM - Abnormal; Notable for the following components:   Creatinine, Ser 1.52 (*)    GFR calc non Af Amer 29 (*)    GFR calc Af Amer 33 (*)    All other components within normal limits  VANCOMYCIN, TROUGH - Abnormal; Notable for the following components:   Vancomycin Tr 13 (*)    All other components within normal limits  CBC WITH DIFFERENTIAL/PLATELET - Abnormal; Notable for the following components:   RBC 3.39 (*)    Hemoglobin 11.1 (*)    HCT 34.7 (*)    MCV 102.4 (*)    Monocytes Absolute 1.5 (*)    All other components within normal limits  COMPREHENSIVE METABOLIC PANEL - Abnormal; Notable for the following components:   BUN 29 (*)    Creatinine, Ser 1.43 (*)    Calcium 8.8 (*)    Total Protein 5.5 (*)    Albumin 2.2 (*)    GFR calc non Af Amer 31 (*)    GFR calc Af Amer 36 (*)    All other components within normal limits  CREATININE, SERUM - Abnormal; Notable for the following components:   Creatinine, Ser 1.45 (*)    GFR calc non Af Amer 30 (*)    GFR calc Af Amer 35 (*)    All other components within normal limits  CULTURE, BLOOD (ROUTINE X 2)  CULTURE, BLOOD (ROUTINE X 2)  URINE CULTURE  CULTURE, BLOOD (ROUTINE X 2)  CULTURE, BLOOD (ROUTINE X 2)  PROTIME-INR  LACTIC ACID, PLASMA  LACTIC ACID, PLASMA  MAGNESIUM  PHOSPHORUS  STREP PNEUMONIAE URINARY ANTIGEN  LEGIONELLA PNEUMOPHILA SEROGP 1 UR AG    EKG None  Radiology No results  found.  Procedures Procedures (including critical care time)  Medications Ordered in ED Medications  ceFEPIme (MAXIPIME) 2 g in sodium chloride 0.9 % 100 mL IVPB (has no administration in time range)  vancomycin (VANCOCIN) IVPB 1000 mg/200 mL premix (has no administration in time range)  sodium chloride 0.9 % bolus 1,000 mL (has no administration in time range)     Initial Impression / Assessment and Plan / ED Course  I have reviewed the triage vital signs and the nursing notes.  Pertinent labs & imaging results that were available during my care of the patient were reviewed by me and considered in my medical decision making (see chart for details).     85 year old female with confusion.  Possibly infectious from cellulitis left lower extremity.  Antibiotics.  Admission.  Final Clinical Impressions(s) / ED Diagnoses   Final diagnoses:  Cellulitis of leg, left  Acute confusional state    ED Discharge Orders    None       Virgel Manifold, MD 01/09/18 [phone]

## 2017-12-30 NOTE — Consult Note (Addendum)
[place] Nurse wound consult note Reason for Consult: Consult requested for right elbow and BLE. Pt fell at home several times recently according to the EMR. Consult was performed using a remote camera and assistance from the bedside nurse. Wound type: Right elbow with partial thickness abrasion; Approx 4X4X.1cm, 50% red, 50% yellow, mod amt tan drainage, no odor. Dark reddish purple bruising surrounding the open area. Left foot with deep tissue injury; approx 5X5cm, dark reddish purple intact clear fluid filled blister, no odor or drainage, or open wound. Right anterior and posterior calf; dark purple red hematoma weeping, approx 10X10cm, no open wound, small amt tan drainage, no odor or fluctuance. Pressure injury present on admission: Yes Dressing procedure/placement/frequency: Xeroform gauze to promote heeling to right elbow and right leg hematoma, light compression with ace wrap to right leg to decrease swelling. Foam dressing to protect and promote healing to left heel, float heel to reduce pressure. No family present to discuss plan of care.   Please re-consult if further assistance is needed.  Thank-you,  Julien Girt MSN, [name], [place], [place], [place]

## 2017-12-30 NOTE — Progress Notes (Signed)
Patient arrived to the floor on room air when O2 Sat checked she was 80%. Placed on 2L and patient did not improve. Dr. Dyann Kief entered the room and made aware of situation and states to place her on 4L. Once on 4L of O2 she sats 94%. Dr. Dyann Kief made aware of patients condition and gives approval to keep her on 4L. Patient is disoriented to place, time, and situation. Will continue to monitor.

## 2017-12-30 NOTE — ED Notes (Signed)
Pt's family with her.  Pt is much calmer at this time and was provided with mouth swab.

## 2017-12-30 NOTE — ED Notes (Signed)
Pt noted to have dressing on legs. Left heel has blister that is opened. Per daughter opened yesterday. Right lateral lower noted to have large hematoma. Swelling noted to bilateral feet 2+. Pitting.Swelling noted to left leg

## 2017-12-30 NOTE — Progress Notes (Signed)
Pharmacy Antibiotic Note  Cindy Schultz is a 84 y.o. female admitted on 12/30/2017 with pneumonia.  Pharmacy has been consulted for vancomycin dosing.  Plan: Vancomycin 1000mg  IV every 48 hours.  Goal trough 15-20 mcg/mL. Vancomycin trough 7/19@0930  before 3rd dose    Height: 5\' 4"  (162.6 cm) Weight: 131 lb 2.8 oz (59.5 kg) IBW/kg (Calculated) : 54.7  Temp (24hrs), Avg:99.7 F (37.6 C), Min:98.6 F (37 C), Max:100.8 F (38.2 C)  Recent Labs  Lab 12/30/17 0942 12/30/17 1203  WBC 9.7  --   CREATININE 1.78*  --   LATICACIDVEN 1.3 1.3    Estimated Creatinine Clearance: 17.8 mL/min (A) (by C-G formula based on SCr of 1.78 mg/dL (H)).    Allergies  Allergen Reactions  . Codeine Nausea And Vomiting  . Aspirin     Cannot take due to Diverticulosis (bleeding side effects)  . Pepto-Bismol [Bismuth Subsalicylate]     Black stools  . Hydrocodone Other (See Comments)    Unknown Reaction     Antimicrobials this admission: Anti-infectives (From admission, onward)   Start     Dose/Rate Route Frequency Ordered Stop   01/01/18 1000  vancomycin (VANCOCIN) IVPB 1000 mg/200 mL premix     1,000 mg 200 mL/hr over 60 Minutes Intravenous Every 48 hours 12/30/17 1641     12/31/17 1000  ceFEPIme (MAXIPIME) 2 g in sodium chloride 0.9 % 100 mL IVPB     2 g 200 mL/hr over 30 Minutes Intravenous Every 24 hours 12/30/17 1616 01/08/18 0959   12/30/17 0945  ceFEPIme (MAXIPIME) 2 g in sodium chloride 0.9 % 100 mL IVPB     2 g 200 mL/hr over 30 Minutes Intravenous  Once 12/30/17 0931 12/30/17 1051   12/30/17 0945  vancomycin (VANCOCIN) IVPB 1000 mg/200 mL premix     1,000 mg 200 mL/hr over 60 Minutes Intravenous  Once 12/30/17 0931 12/30/17 1205       Microbiology results: 7/15 BCx: pending 7/15 UCx: sent  7/15 Sputum: sent  7/15 MRSA PCR: pending   Thank you for allowing pharmacy to be a part of this patient's care.  Donna Christen Miia Blanks 12/30/2017 4:41 PM

## 2017-12-30 NOTE — H&P (Signed)
History and Physical    Cindy Schultz KGM:[id number] DOB: 1923-10-20 DOA: 12/30/2017  Referring MD/NP/PA: Dr. Wilson Singer PCP: Sinda Du, MD  Patient coming from: SNF  Chief Complaint: SOB, increase confusion and fever.   HPI: Cindy Schultz is a 84 y.o. female with past medical history significant for chronic kidney disease stage IV, Dementia, GERD, hypertension, recent admission due to lumbar compression/status post kyphoplasty; who came from SNF due to increase confusion, fever, SOB and LLE swelling/erythema. Symptoms started 3 days ago or so, and worsening. Patient expressed not feeling good, but denies CP, nausea, vomiting, abd pain, dysuria, hematuria and blood in her stools (keeping in mind that the patient has underlying history of dementia I cannot truly elaborate much or being trusted in her symptoms description).  In the ED she was found to be hypoxic in the 8687 oxygen saturation on room air, found to be febrile, tachycardic, tachypneic, hyponatremic, hypochloremic and with Cr up to 1.7. CXR with atelectasis, but failing to show acute infiltrates.   Patient's daughter reported that she has not being eating or drinking well in the last couple of days.   Past Medical/Surgical History: Past Medical History:  Diagnosis Date  . Cancer [facility]) March 2016   Skin Cancer :  Right Cheek     SCC  . Cancer [facility]) 2013   Skin Cancer  :  Anterior Neck   SCC  . Carotid artery occlusion   . Chronic kidney disease   . Diverticulitis   . DVT (deep venous thrombosis) ([place]) 07/14/10   LEA doppler   . GI bleeding    2013/2016/2018: suspected diverticular bleeding  . Glaucoma   . Hearing loss   . HTN (hypertension)    cholesterol  . Lumbar spine pain   . Lymphedema   . Macular degeneration   . Pain in joints   . Precancerous changes of the cervix    lesions  . Rectocele   . SOB (shortness of breath) 04/04/2010   2D echo EF=>55%    Past Surgical History:  Procedure Laterality  Date  . ABDOMINAL HYSTERECTOMY    . appendix    . bladder tac    . COLONOSCOPY  09/20/2011   Dr. Carol Ada: pandiverticulosis, hemorrhoids. ascending colon polyp not removed in setting of active bleeding.  Marland Kitchen COLONOSCOPY N/A 04/10/2017   Dr. [name]: three 4-6 mm polyps in ascending colon and cecum, s/p removal. tubular adenomas. diverticulosis in entire examined colon.   . corneal erosion     cataracts   . elevated metatarsal arch    . fallen uterus    . hemmorrhoidectomy    . IR KYPHO LUMBAR INC FX REDUCE BONE BX UNI/BIL CANNULATION INC/IMAGING  12/17/2017  . left total knee replacement  05/02/04   [name]  . met osteostomy    . morton's nueroma    . OVARIAN CYST SURGERY    . RECTOCELE REPAIR    . rupture rt groin    . salk    . SKIN CANCER EXCISION Right March 2016  2013   Cheek -  [facility]  and  Anterior Neck  . tracheotomy      Social History:  reports that she has never smoked. She has never used smokeless tobacco. She reports that she does not drink alcohol or use drugs.  Allergies: Allergies  Allergen Reactions  . Codeine Nausea And Vomiting  . Aspirin     Cannot take due to Diverticulosis (bleeding side effects)  . Pepto-Bismol [  Bismuth Subsalicylate]     Black stools  . Hydrocodone Other (See Comments)    Unknown Reaction     Family History:  Family History  Problem Relation Age of Onset  . Stroke Mother   . Diabetes Mother   . Heart attack Father   . COPD Paternal Grandfather   . Dementia Sister   . Colon cancer Neg Hx     Prior to Admission medications   Medication Sig Start Date End Date Taking? Authorizing Provider  acetaminophen (TYLENOL) 500 MG tablet Take 500 mg by mouth every 6 (six) hours as needed (pain).    Yes [provider]  Calcium 600-200 MG-UNIT per tablet Take 1 tablet by mouth daily.     Yes [provider]  docusate sodium (COLACE) 100 MG capsule Take 100 mg by mouth daily.   Yes [provider]    dorzolamide-timolol (COSOPT) 22.3-6.8 MG/ML ophthalmic solution Place 1 drop into both eyes 2 (two) times daily.     Yes [provider]  furosemide (LASIX) 20 MG tablet Take 20 mg by mouth daily. Take 1 tablet by mouth once a day for 7 days   Yes [provider]  gabapentin (NEURONTIN) 100 MG capsule Take 2 capsules (200 mg total) by mouth 3 (three) times daily. 12/17/17  Yes Sinda Du, MD  losartan (COZAAR) 50 MG tablet Take 50 mg by mouth daily.  12/25/14  Yes [provider]  LUMIGAN 0.01 % SOLN Place 1 drop into both eyes at bedtime.  03/06/17  Yes [provider]  Melatonin 5 MG TABS Take by mouth at bedtime.   Yes [provider]  Multiple Vitamins-Minerals (PRESERVISION AREDS 2 PO) Take 2 tablets by mouth every morning. One tablet daily    Yes [provider]  NIFEdipine (PROCARDIA-XL/ADALAT-CC/NIFEDICAL-XL) 30 MG 24 hr tablet Take 30 mg by mouth daily. 11/24/17  Yes [provider]  oxybutynin (DITROPAN-XL) 10 MG 24 hr tablet Take 10 mg by mouth daily.    Yes [provider]  Oxycodone HCl 10 MG TABS Take 1 mg by mouth 2 (two) times daily.   Yes [provider]  pantoprazole (PROTONIX) 40 MG tablet Take 40 mg by mouth daily. 11/25/17  Yes [provider]  polyethylene glycol (MIRALAX / GLYCOLAX) packet Take 17 g by mouth daily.   Yes [provider]  potassium chloride SA (K-DUR,KLOR-CON) 20 MEQ tablet Take 20 mEq by mouth daily. Take 1 tablet by mouth once a day for 10 days   Yes [provider]  promethazine (PHENERGAN) 12.5 MG tablet Take 1 tablet (12.5 mg total) by mouth every 6 (six) hours as needed for nausea or vomiting. 10/21/17  Yes Carole Civil, MD  traMADol (ULTRAM) 50 MG tablet Take 1 tablet (50 mg total) by mouth every 6 (six) hours as needed. Patient taking differently: Take 25-50 mg by mouth every 6 (six) hours as needed.  10/21/17  Yes Carole Civil, MD   predniSONE (DELTASONE) 10 MG tablet Take 10 mg by mouth daily as needed.    [provider]    Review of Systems:  Negative except as otherwise mentioned in HPI.  Given patient underlying dementia level 5 caveat applies.    Physical Exam: Vitals:   12/30/17 1030 12/30/17 1100 12/30/17 1115 12/30/17 1303  BP: 106/84 107/85  116/78  Pulse:   80 70  Resp: 18 (!) 23 19   Temp:    98.6 F (37 C)  TempSrc:    Oral  SpO2:   93% 94%  Weight:    59.5 kg (131 lb 2.8 oz)  Height:    '5\' 4"'  (1.626 m)    Constitutional: NAD, calm, denying chest pain, abdominal pain, nausea and vomiting.  Slightly warm to touch, wearing oxygen supplementation (4 L nasal cannula) and noticing to be slightly tachypneic while completing sentences and answering questions. Eyes: PERRL, lids and conjunctivae normal, no icterus, no nystagmus. ENMT: Mucous membranes dry on exam. Posterior pharynx clear of any exudate or lesions.no thrush. Neck: normal, supple, no masses, no thyromegaly. Respiratory: No wheezing, positive rhonchi, decreased breath sounds at the bases, tachypneic; no using accessory muscles.  Nasal cannula 4 L in place. Cardiovascular: Tachycardic, no rubs, no gallops, no JVD appreciated on exam.   Abdomen: no tenderness, no masses palpated. No hepatosplenomegaly. Bowel sounds positive.  Musculoskeletal: No cyanosis. No joint deformity upper and lower extremities. No joint swelling, good ROM, no contractures.  Skin: Large right lower extremity hematoma, left heel blister (broken, without active drainage), left lower extremity with erythema, warmth sensation and mild posterior small hematoma.  Patient with bilateral lower extremity 2+ pitting edema.  She is having a stage I pressure injury in her sacrum.  Also with a small pressure injuries affecting bilateral elbows.   Neurologic: CN grossly intact. DTR normal. Strength 3/5 in all 4 limbs in the setting of deconditioning and poor effort.    Psychiatric: judgment and insight appears poor due to dementia, Alert, awake and oriented x to person only. Normal mood.    Labs on Admission: I have personally reviewed the following labs and imaging studies  CBC: Recent Labs  Lab 12/30/17 0942  WBC 9.7  NEUTROABS 7.3  HGB 11.3*  HCT 34.7*  MCV 101.8*  PLT 338*   Basic Metabolic Panel: Recent Labs  Lab 12/30/17 0942  NA 134*  K 5.1  CL 97*  CO2 28  GLUCOSE 108*  BUN 42*  CREATININE 1.78*  CALCIUM 8.7*   GFR: Estimated Creatinine Clearance: 17.8 mL/min (A) (by C-G formula based on SCr of 1.78 mg/dL (H)).   Liver Function Tests: Recent Labs  Lab 12/30/17 0942  AST 20  ALT 16  ALKPHOS 72  BILITOT 0.6  PROT 5.4*  ALBUMIN 2.4*   Coagulation Profile: Recent Labs  Lab 12/30/17 0942  INR 1.05   Urine analysis:    Component Value Date/Time   COLORURINE YELLOW 12/30/2017 0915   APPEARANCEUR HAZY (A) 12/30/2017 0915   LABSPEC 1.013 12/30/2017 0915   PHURINE 5.0 12/30/2017 0915   GLUCOSEU NEGATIVE 12/30/2017 0915   HGBUR NEGATIVE 12/30/2017 0915   BILIRUBINUR NEGATIVE 12/30/2017 0915   KETONESUR NEGATIVE 12/30/2017 0915   PROTEINUR NEGATIVE 12/30/2017 0915   UROBILINOGEN 0.2 08/05/2009 1533   NITRITE NEGATIVE 12/30/2017 0915   LEUKOCYTESUR NEGATIVE 12/30/2017 0915    Recent Results (from the past 240 hour(s))  Culture, blood (Routine x 2)     Status: None (Preliminary result)   Collection Time: 12/30/17  9:42 AM  Result Value Ref Range Status   Specimen Description BLOOD RIGHT FOREARM DRAWN BY RN  Final   Special Requests   Final    BOTTLES DRAWN AEROBIC ONLY Blood Culture results may not be optimal due to an inadequate volume of blood received in culture bottles Performed at [facility], [address], [place], [place] [address]    Culture PENDING  Incomplete   Report Status PENDING  Incomplete  Culture, blood (Routine x  2)     Status: None (Preliminary result)   Collection Time: 12/30/17  9:42  AM  Result Value Ref Range Status   Specimen Description BLOOD LEFT ARM DRAWN BY RN  Final   Special Requests   Final    BOTTLES DRAWN AEROBIC AND ANAEROBIC Blood Culture adequate volume Performed at [facility], [address] [place], [place] [address]    Culture PENDING  Incomplete   Report Status PENDING  Incomplete     Radiological Exams on Admission: Dg Chest Port 1 View  Result Date: 12/30/2017 CLINICAL DATA:  Altered mental status. EXAM: PORTABLE CHEST 1 VIEW COMPARISON:  PA and lateral chest 08/31/2015 and 05/25/2015. FINDINGS: Lung volumes are lower than on the comparison examinations with associated basilar atelectasis. Heart size is normal. No pneumothorax or pleural fluid. Aortic atherosclerosis is noted. No acute or focal bony abnormality. IMPRESSION: Bibasilar subsegmental atelectasis in a low volume chest. No acute disease. Electronically Signed   By: Inge Rise M.D.   On: 12/30/2017 10:21    EKG: Independently reviewed.  Sinus tachycardia, normal axis, nonspecific ST/T wave changes.  No acute ischemic abnormalities appreciated.  Assessment/Plan 1-sepsis [facility]): Due to HCAP versus left lower extremity cellulitis.  Patient met sepsis criteria on admission with fever, tachycardia, tachypnea, hypoxia and suspected source of infection her lungs. -admitted to med-surg -after discussing with daughter patient is DNR/DNI and they would no pursuit BIPAP. -started on oxygen supplementation -cefepime and Vancomycin for HCAP -follow blood cx's, urine culture, sputum culture, strep/legionella antigen in urine -PRN duoneb, flutter valve and mucinex. -gentle IVF's and supportive care -will check CT chest in am   2-HTN (hypertension) -BP stable -will diuretics and continue Procardia -follow VS    3-acute on CKD (chronic kidney disease) stage 4, GFR 15-29 ml/min (HCC) -most likely in the setting of poor oral intake and dehydration -will provide gentle IVF's -holding  nephrotoxic agents -follow renal function trend   4-Pressure injury of skin -wound care service consulted, will follow dressing rec's -continue prevention measures  5-Cellulitis of left leg -would be cover with current antibiotics. -maintain legs elevated   6-Glaucoma (increased eye pressure) -continue Trusopt and xalatan   7-GERD -continue PPI  8-dementia: -continue supportive care  9-recent kyphoplasty  -continue PRN analgesics -further care and rehab at SNF once stable.   DVT prophylaxis: Heparin Code Status: DNR/DNI Family Communication: Daughter at bedside. Disposition Plan: Back to skilled nursing facility once medically stable. Consults called: None Admission status: LOS more than 2 midnights, inpatient, MedSurg bed.   Time Spent: 70 minutes  Barton Dubois MD [facility] Pager [phone]  If 7PM-7AM, please contact night-coverage [email or web address] Password [email or web address]  12/30/2017, 4:20 PM

## 2017-12-30 NOTE — ED Triage Notes (Signed)
Pt brought over by EMS due to altered mental status. Daughter reports to EMS that pt is altered today,not responding appropriately and confused. Pt has been on antibiotics for her legs.

## 2017-12-31 ENCOUNTER — Inpatient Hospital Stay (HOSPITAL_COMMUNITY): Payer: Medicare Other

## 2017-12-31 DIAGNOSIS — I340 Nonrheumatic mitral (valve) insufficiency: Secondary | ICD-10-CM

## 2017-12-31 LAB — STREP PNEUMONIAE URINARY ANTIGEN: Strep Pneumo Urinary Antigen: NEGATIVE

## 2017-12-31 LAB — URINE CULTURE: Culture: NO GROWTH

## 2017-12-31 LAB — BASIC METABOLIC PANEL
Anion gap: 9 (ref 5–15)
BUN: 36 mg/dL — AB (ref 8–23)
CALCIUM: 8.5 mg/dL — AB (ref 8.9–10.3)
CO2: 23 mmol/L (ref 22–32)
CREATININE: 1.57 mg/dL — AB (ref 0.44–1.00)
Chloride: 105 mmol/L (ref 98–111)
GFR calc non Af Amer: 28 mL/min — ABNORMAL LOW (ref >60)
GFR, EST AFRICAN AMERICAN: 32 mL/min — AB (ref >60)
Glucose, Bld: 97 mg/dL (ref 70–99)
Potassium: 4.3 mmol/L (ref 3.5–5.1)
SODIUM: 137 mmol/L (ref 135–145)

## 2017-12-31 LAB — CBC
HCT: 33.4 % — ABNORMAL LOW (ref 36.0–46.0)
Hemoglobin: 10.8 g/dL — ABNORMAL LOW (ref 12.0–15.0)
MCH: 33 pg (ref 26.0–34.0)
MCHC: 32.3 g/dL (ref 30.0–36.0)
MCV: 102.1 fL — ABNORMAL HIGH (ref 78.0–100.0)
PLATELETS: 125 10*3/uL — AB (ref 150–400)
RBC: 3.27 MIL/uL — AB (ref 3.87–5.11)
RDW: 15.3 % (ref 11.5–15.5)
WBC: 12.3 10*3/uL — ABNORMAL HIGH (ref 4.0–10.5)

## 2017-12-31 LAB — ECHOCARDIOGRAM COMPLETE
Height: 64 in
WEIGHTICAEL: 2169.33 [oz_av]

## 2017-12-31 MED ORDER — SODIUM CHLORIDE 0.9 % IV SOLN
1.0000 g | INTRAVENOUS | Status: DC
  Administered 2017-12-31 – 2018-01-06 (×7): 1 g via INTRAVENOUS
  Filled 2017-12-31 (×8): qty 1

## 2017-12-31 MED ORDER — SODIUM CHLORIDE 0.9 % IV SOLN
INTRAVENOUS | Status: DC
  Administered 2017-12-31 – 2018-01-02 (×3): via INTRAVENOUS

## 2017-12-31 MED ORDER — MUPIROCIN 2 % EX OINT
1.0000 "application " | TOPICAL_OINTMENT | Freq: Two times a day (BID) | CUTANEOUS | Status: AC
  Administered 2018-01-01 – 2018-01-05 (×10): 1 via NASAL
  Filled 2017-12-31: qty 22

## 2017-12-31 MED ORDER — CHLORHEXIDINE GLUCONATE CLOTH 2 % EX PADS
6.0000 | MEDICATED_PAD | Freq: Every day | CUTANEOUS | Status: AC
  Administered 2017-12-31 – 2018-01-04 (×5): 6 via TOPICAL

## 2017-12-31 NOTE — Clinical Social Work Note (Signed)
Received CSW consult as pt admitted from [facility]. Pt was hospitalized at [facility] in late June. CSW assessment completed on 12/13/17 is copied below. Pt discharged to [facility] for short term rehab on 12/17/17. Will follow along and assist with return to [facility] to complete rehab as appropriate.  Clinical Social Work Assessment  Patient Details  Name: Cindy Schultz MRN: [id number] Date of Birth: Oct 03, 1923  Date of referral:  12/13/17               Reason for consult:  Facility Placement                 Permission sought to share information with:    Permission granted to share information::                Name::                   Agency::                Relationship::                Contact Information:  Dtr. Annye English  Housing/Transportation Living arrangements for the past 2 months:  [place] of Information:  Patient, Adult Children Patient Interpreter Needed:  None Criminal Activity/Legal Involvement Pertinent to Current Situation/Hospitalization:  No - Comment as needed Significant Relationships:  Adult Children Lives with:  Self Do you feel safe going back to the place where you live?  Yes Need for family participation in patient care:  Yes (Comment)  Care giving concerns:  None at baseline.    Social Worker assessment / plan:  Patient ambulates with a tripod cane, drives, is independent in ADLs and has lifeline.  She is agreeable to short term rehab at [facility].   Employment status:  Retired Forensic scientist:  Medicare PT Recommendations:  [place] / Referral to community resources:  [place]  Patient/Family's Response to care:  Patient is agreeable to short term rehab at [facility]   Patient/Family's Understanding of and Emotional Response to Diagnosis, Current Treatment, and Prognosis:  Patient and family understand patient's diagnosis, treatment and prognosis.   Emotional Assessment Appearance:  Appears  stated age Attitude/Demeanor/Rapport:    Affect (typically observed):  Accepting Orientation:  Oriented to Self, Oriented to Place, Oriented to Situation, Oriented to  Time Alcohol / Substance use:  Not Applicable Psych involvement (Current and /or in the community):  No (Comment)  Discharge Needs  Concerns to be addressed:  No discharge needs identified Readmission within the last 30 days:  No Current discharge risk:  None Barriers to Discharge:  No Barriers Identified   Ihor Gully, LCSW 12/13/2017, 4:19 PM

## 2017-12-31 NOTE — Progress Notes (Signed)
*  PRELIMINARY RESULTS* Echocardiogram 2D Echocardiogram has been performed.  Leavy Cella 12/31/2017, 2:30 PM

## 2017-12-31 NOTE — Progress Notes (Signed)
Subjective: She was admitted from a skilled care facility with sepsis.  She may have pneumonia.  She also has significantly swollen legs with multiple wounds.  Wound care consult was done via remote camera and the recommendations are noted and are underway.  She is more alert this morning than before.  She has no complaints.  Objective: Vital signs in last 24 hours: Temp:  [98.2 F (36.8 C)-100.8 F (38.2 C)] 98.2 F (36.8 C) (07/16 0529) Pulse Rate:  [70-125] 100 (07/16 0529) Resp:  [16-31] 17 (07/16 0529) BP: (99-149)/(42-106) 138/86 (07/16 0529) SpO2:  [84 %-98 %] 87 % (07/16 0529) Weight:  [55.8 kg (123 lb)-61.5 kg (135 lb 9.3 oz)] 61.5 kg (135 lb 9.3 oz) (07/16 0529) Weight change:  Last BM Date: (unknown)  Intake/Output from previous day: 07/15 0701 - 07/16 0700 In: 1105.8 [P.O.:240; I.V.:599.2; IV Piggyback:266.7] Out: 300 [Urine:300]  PHYSICAL EXAM General appearance: alert and mild distress Resp: rhonchi bilaterally Cardio: regular rate and rhythm, S1, S2 normal, no murmur, click, rub or gallop GI: soft, non-tender; bowel sounds normal; no masses,  no organomegaly Extremities: She has a 10 x 10 cm wound on her right calf that is draining some serosanguineous material.  She has a smaller wound on her left calf that is also draining some serosanguineous material.  She has a large blood blister on her left heel.  She has hematomas on her elbows.  Lab Results:  Results for orders placed or performed during the hospital encounter of 12/30/17 (from the past 48 hour(s))  Urinalysis, Routine w reflex microscopic     Status: Abnormal   Collection Time: 12/30/17  9:15 AM  Result Value Ref Range   Color, Urine YELLOW YELLOW   APPearance HAZY (A) CLEAR   Specific Gravity, Urine 1.013 1.005 - 1.030   pH 5.0 5.0 - 8.0   Glucose, UA NEGATIVE NEGATIVE mg/dL   Hgb urine dipstick NEGATIVE NEGATIVE   Bilirubin Urine NEGATIVE NEGATIVE   Ketones, ur NEGATIVE NEGATIVE mg/dL   Protein,  ur NEGATIVE NEGATIVE mg/dL   Nitrite NEGATIVE NEGATIVE   Leukocytes, UA NEGATIVE NEGATIVE    Comment: Performed at [facility], [address], [place], [place] [address]  Comprehensive metabolic panel     Status: Abnormal   Collection Time: 12/30/17  9:42 AM  Result Value Ref Range   Sodium 134 (L) 135 - 145 mmol/L   Potassium 5.1 3.5 - 5.1 mmol/L   Chloride 97 (L) 98 - 111 mmol/L    Comment: Please note change in reference range.   CO2 28 22 - 32 mmol/L   Glucose, Bld 108 (H) 70 - 99 mg/dL    Comment: Please note change in reference range.   BUN 42 (H) 8 - 23 mg/dL    Comment: Please note change in reference range.   Creatinine, Ser 1.78 (H) 0.44 - 1.00 mg/dL   Calcium 8.7 (L) 8.9 - 10.3 mg/dL   Total Protein 5.4 (L) 6.5 - 8.1 g/dL   Albumin 2.4 (L) 3.5 - 5.0 g/dL   AST 20 15 - 41 U/L   ALT 16 0 - 44 U/L    Comment: Please note change in reference range.   Alkaline Phosphatase 72 38 - 126 U/L   Total Bilirubin 0.6 0.3 - 1.2 mg/dL   GFR calc non Af Amer 24 (L) >60 mL/min   GFR calc Af Amer 28 (L) >60 mL/min    Comment: (NOTE) The eGFR has been calculated using the CKD  EPI equation. This calculation has not been validated in all clinical situations. eGFR's persistently <60 mL/min signify possible Chronic Kidney Disease.    Anion gap 9 5 - 15    Comment: Performed at [facility], [address] [place], [place] [address]  CBC with Differential     Status: Abnormal   Collection Time: 12/30/17  9:42 AM  Result Value Ref Range   WBC 9.7 4.0 - 10.5 K/uL   RBC 3.41 (L) 3.87 - 5.11 MIL/uL   Hemoglobin 11.3 (L) 12.0 - 15.0 g/dL   HCT 34.7 (L) 36.0 - 46.0 %   MCV 101.8 (H) 78.0 - 100.0 fL   MCH 33.1 26.0 - 34.0 pg   MCHC 32.6 30.0 - 36.0 g/dL   RDW 15.3 11.5 - 15.5 %   Platelets 138 (L) 150 - 400 K/uL   Neutrophils Relative % 76 %   Lymphocytes Relative 15 %   Monocytes Relative 8 %   Eosinophils Relative 1 %   Basophils Relative 0 %   Neutro Abs 7.3 1.7 - 7.7 K/uL    Lymphs Abs 1.5 0.7 - 4.0 K/uL   Monocytes Absolute 0.8 0.1 - 1.0 K/uL   Eosinophils Absolute 0.1 0.0 - 0.7 K/uL   Basophils Absolute 0.0 0.0 - 0.1 K/uL   RBC Morphology RARE NRBCs    WBC Morphology MILD LEFT SHIFT (1-5% METAS, OCC MYELO, OCC BANDS)     Comment: Performed at [facility], [address], [place], [place] [address]  Protime-INR     Status: None   Collection Time: 12/30/17  9:42 AM  Result Value Ref Range   Prothrombin Time 13.6 11.4 - 15.2 seconds   INR 1.05     Comment: Performed at [facility], [address] [place] [address] [place], [place] [address]  Culture, blood (Routine x 2)     Status: None (Preliminary result)   Collection Time: 12/30/17  9:42 AM  Result Value Ref Range   Specimen Description BLOOD RIGHT FOREARM DRAWN BY RN    Special Requests      BOTTLES DRAWN AEROBIC ONLY Blood Culture results may not be optimal due to an inadequate volume of blood received in culture bottles   Culture      NO GROWTH < 24 HOURS Performed at [facility], [address], [place], [place] [address]    Report Status PENDING   Culture, blood (Routine x 2)     Status: None (Preliminary result)   Collection Time: 12/30/17  9:42 AM  Result Value Ref Range   Specimen Description BLOOD LEFT ARM DRAWN BY RN    Special Requests      BOTTLES DRAWN AEROBIC AND ANAEROBIC Blood Culture adequate volume   Culture      NO GROWTH < 24 HOURS Performed at [facility], [address], [place], [place] [address]    Report Status PENDING   Lactic acid, plasma     Status: None   Collection Time: 12/30/17  9:42 AM  Result Value Ref Range   Lactic Acid, Venous 1.3 0.5 - 1.9 mmol/L    Comment: Performed at [facility], [address], [place], [place] [address]  Lactic acid, plasma     Status: None   Collection Time: 12/30/17 12:03 PM  Result Value Ref Range   Lactic Acid, Venous 1.3 0.5 - 1.9 mmol/L    Comment: Performed at [facility], [address], [place],  [address]  MRSA PCR Screening      Status:  Abnormal   Collection Time: 12/30/17  1:17 PM  Result Value Ref Range   MRSA by PCR POSITIVE (A) NEGATIVE    Comment:        The GeneXpert MRSA Assay (FDA approved for NASAL specimens only), is one component of a comprehensive MRSA colonization surveillance program. It is not intended to diagnose MRSA infection nor to guide or monitor treatment for MRSA infections. RESULT CALLED TO, READ BACK BY AND VERIFIED WITH: TAYLOR,RN AT 1813 ON 7.15.2019 BY ISLEY,B Performed at [facility], [address], [place], [place] [address]   Magnesium     Status: None   Collection Time: 12/30/17  5:42 PM  Result Value Ref Range   Magnesium 1.9 1.7 - 2.4 mg/dL    Comment: Performed at [facility], [address] [place], [place] [address]  Phosphorus     Status: None   Collection Time: 12/30/17  5:42 PM  Result Value Ref Range   Phosphorus 3.6 2.5 - 4.6 mg/dL    Comment: Performed at [facility], [address] [place], [place] [address]  Culture, blood (routine x 2) Call MD if unable to obtain prior to antibiotics being given     Status: None (Preliminary result)   Collection Time: 12/30/17  5:42 PM  Result Value Ref Range   Specimen Description BLOOD LEFT HAND    Special Requests      BOTTLES DRAWN AEROBIC ONLY Blood Culture adequate volume   Culture      NO GROWTH < 24 HOURS Performed at [facility], [address] [place], [place] [address]    Report Status PENDING   Culture, blood (routine x 2) Call MD if unable to obtain prior to antibiotics being given     Status: None (Preliminary result)   Collection Time: 12/30/17 11:29 PM  Result Value Ref Range   Specimen Description BLOOD RIGHT ARM    Special Requests      BOTTLES DRAWN AEROBIC AND ANAEROBIC Blood Culture adequate volume   Culture      NO GROWTH < 12 HOURS Performed at [facility], [address] [place],  [address]    Report Status PENDING   Basic metabolic panel     Status: Abnormal   Collection  Time: 12/31/17  5:50 AM  Result Value Ref Range   Sodium 137 135 - 145 mmol/L   Potassium 4.3 3.5 - 5.1 mmol/L   Chloride 105 98 - 111 mmol/L    Comment: Please note change in reference range.   CO2 23 22 - 32 mmol/L   Glucose, Bld 97 70 - 99 mg/dL    Comment: Please note change in reference range.   BUN 36 (H) 8 - 23 mg/dL    Comment: Please note change in reference range.   Creatinine, Ser 1.57 (H) 0.44 - 1.00 mg/dL   Calcium 8.5 (L) 8.9 - 10.3 mg/dL   GFR calc non Af Amer 28 (L) >60 mL/min   GFR calc Af Amer 32 (L) >60 mL/min    Comment: (NOTE) The eGFR has been calculated using the CKD EPI equation. This calculation has not been validated in all clinical situations. eGFR's persistently <60 mL/min signify possible Chronic Kidney Disease.    Anion gap 9 5 - 15    Comment: Performed at [facility], [address] [place],  [address]  CBC     Status: Abnormal   Collection Time: 12/31/17  5:50 AM  Result Value Ref Range   WBC  12.3 (H) 4.0 - 10.5 K/uL   RBC 3.27 (L) 3.87 - 5.11 MIL/uL   Hemoglobin 10.8 (L) 12.0 - 15.0 g/dL   HCT 33.4 (L) 36.0 - 46.0 %   MCV 102.1 (H) 78.0 - 100.0 fL   MCH 33.0 26.0 - 34.0 pg   MCHC 32.3 30.0 - 36.0 g/dL   RDW 15.3 11.5 - 15.5 %   Platelets 125 (L) 150 - 400 K/uL    Comment: Performed at [facility], [address], [place], [place] [address]    ABGS No results for input(s): PHART, PO2ART, TCO2, HCO3 in the last 72 hours.  Invalid input(s): PCO2 CULTURES Recent Results (from the past 240 hour(s))  Culture, blood (Routine x 2)     Status: None (Preliminary result)   Collection Time: 12/30/17  9:42 AM  Result Value Ref Range Status   Specimen Description BLOOD RIGHT FOREARM DRAWN BY RN  Final   Special Requests   Final    BOTTLES DRAWN AEROBIC ONLY Blood Culture results may not be optimal due to an inadequate volume of blood received in culture bottles   Culture   Final    NO GROWTH < 24 HOURS Performed at [facility],  [address], [place], [place] [address]    Report Status PENDING  Incomplete  Culture, blood (Routine x 2)     Status: None (Preliminary result)   Collection Time: 12/30/17  9:42 AM  Result Value Ref Range Status   Specimen Description BLOOD LEFT ARM DRAWN BY RN  Final   Special Requests   Final    BOTTLES DRAWN AEROBIC AND ANAEROBIC Blood Culture adequate volume   Culture   Final    NO GROWTH < 24 HOURS Performed at [facility], [address], [place], [place] [address]    Report Status PENDING  Incomplete  MRSA PCR Screening     Status: Abnormal   Collection Time: 12/30/17  1:17 PM  Result Value Ref Range Status   MRSA by PCR POSITIVE (A) NEGATIVE Final    Comment:        The GeneXpert MRSA Assay (FDA approved for NASAL specimens only), is one component of a comprehensive MRSA colonization surveillance program. It is not intended to diagnose MRSA infection nor to guide or monitor treatment for MRSA infections. RESULT CALLED TO, READ BACK BY AND VERIFIED WITH: TAYLOR,RN AT 1813 ON 7.15.2019 BY ISLEY,B Performed at [facility], [address] [place], [place] [address]   Culture, blood (routine x 2) Call MD if unable to obtain prior to antibiotics being given     Status: None (Preliminary result)   Collection Time: 12/30/17  5:42 PM  Result Value Ref Range Status   Specimen Description BLOOD LEFT HAND  Final   Special Requests   Final    BOTTLES DRAWN AEROBIC ONLY Blood Culture adequate volume   Culture   Final    NO GROWTH < 24 HOURS Performed at [facility], [address], [place], [place] [address]    Report Status PENDING  Incomplete  Culture, blood (routine x 2) Call MD if unable to obtain prior to antibiotics being given     Status: None (Preliminary result)   Collection Time: 12/30/17 11:29 PM  Result Value Ref Range Status   Specimen Description BLOOD RIGHT ARM  Final   Special Requests   Final    BOTTLES DRAWN AEROBIC AND ANAEROBIC Blood Culture adequate volume    Culture   Final  NO GROWTH < 12 HOURS Performed at [facility], [address] [place], [place] [address]    Report Status PENDING  Incomplete   Studies/Results: Ct Chest Wo Contrast  Result Date: 12/31/2017 CLINICAL DATA:  Shortness of breath, fever, hypoxia, and tachycardia. EXAM: CT CHEST WITHOUT CONTRAST TECHNIQUE: Multidetector CT imaging of the chest was performed following the standard protocol without IV contrast. COMPARISON:  Chest x-rays dated 12/30/2017, 08/31/2015, and 03/25/2015 FINDINGS: Cardiovascular: Heart size is normal. Aortic atherosclerosis. Extensive coronary artery calcifications. Tortuous proximal brachiocephalic vessels are responsible for the slight widening of the superior mediastinum. Mediastinum/Nodes: No enlarged mediastinal or axillary lymph nodes. Thyroid gland, trachea, and esophagus demonstrate no significant findings. Lungs/Pleura: There are small bilateral pleural effusions with slight atelectasis at the lung bases. Upper Abdomen: No acute abnormality. Musculoskeletal: No chest wall mass or suspicious bone lesions identified. IMPRESSION: 1. Small bilateral pleural effusions with adjacent bibasilar atelectasis. 2. Slight accentuation of the interstitial markings without discrete interstitial edema, nonspecific. 3.  Aortic Atherosclerosis (ICD10-I70.0). Electronically Signed   By: Lorriane Shire M.D.   On: 12/31/2017 08:29   Dg Chest Port 1 View  Result Date: 12/30/2017 CLINICAL DATA:  Altered mental status. EXAM: PORTABLE CHEST 1 VIEW COMPARISON:  PA and lateral chest 08/31/2015 and 05/25/2015. FINDINGS: Lung volumes are lower than on the comparison examinations with associated basilar atelectasis. Heart size is normal. No pneumothorax or pleural fluid. Aortic atherosclerosis is noted. No acute or focal bony abnormality. IMPRESSION: Bibasilar subsegmental atelectasis in a low volume chest. No acute disease. Electronically Signed   By: Inge Rise M.D.   On:  12/30/2017 10:21    Medications:  Prior to Admission:  Medications Prior to Admission  Medication Sig Dispense Refill Last Dose  . acetaminophen (TYLENOL) 500 MG tablet Take 500 mg by mouth every 6 (six) hours as needed (pain).    Past Week at Unknown time  . Calcium 600-200 MG-UNIT per tablet Take 1 tablet by mouth daily.     12/29/2017 at 0800  . docusate sodium (COLACE) 100 MG capsule Take 100 mg by mouth daily.   12/29/2017 at 0800  . dorzolamide-timolol (COSOPT) 22.3-6.8 MG/ML ophthalmic solution Place 1 drop into both eyes 2 (two) times daily.     12/29/2017 at 2000  . furosemide (LASIX) 20 MG tablet Take 20 mg by mouth daily. Take 1 tablet by mouth once a day for 7 days   12/29/2017 at 0800  . gabapentin (NEURONTIN) 100 MG capsule Take 2 capsules (200 mg total) by mouth 3 (three) times daily.   12/29/2017 at 1800  . losartan (COZAAR) 50 MG tablet Take 50 mg by mouth daily.    12/29/2017 at 0800  . LUMIGAN 0.01 % SOLN Place 1 drop into both eyes at bedtime.    12/29/2017 at 2200  . Melatonin 5 MG TABS Take by mouth at bedtime.   12/29/2017 at 2200  . Multiple Vitamins-Minerals (PRESERVISION AREDS 2 PO) Take 2 tablets by mouth every morning. One tablet daily    12/29/2017 at 0800  . NIFEdipine (PROCARDIA-XL/ADALAT-CC/NIFEDICAL-XL) 30 MG 24 hr tablet Take 30 mg by mouth daily.  11 12/29/2017 at 0800  . oxybutynin (DITROPAN-XL) 10 MG 24 hr tablet Take 10 mg by mouth daily.    12/29/2017 at 0800  . Oxycodone HCl 10 MG TABS Take 1 mg by mouth 2 (two) times daily.   12/29/2017 at 2000  . pantoprazole (PROTONIX) 40 MG tablet Take 40 mg by mouth daily.  12 12/29/2017  at 0800  . polyethylene glycol (MIRALAX / GLYCOLAX) packet Take 17 g by mouth daily.   12/29/2017 at 0800  . potassium chloride SA (K-DUR,KLOR-CON) 20 MEQ tablet Take 20 mEq by mouth daily. Take 1 tablet by mouth once a day for 10 days   12/29/2017 at 0800  . promethazine (PHENERGAN) 12.5 MG tablet Take 1 tablet (12.5 mg total) by mouth every 6  (six) hours as needed for nausea or vomiting. 30 tablet 0 12/11/2017 at Unknown time  . traMADol (ULTRAM) 50 MG tablet Take 1 tablet (50 mg total) by mouth every 6 (six) hours as needed. (Patient taking differently: Take 25-50 mg by mouth every 6 (six) hours as needed. ) 60 tablet 5 12/11/2017 at Unknown time  . predniSONE (DELTASONE) 10 MG tablet Take 10 mg by mouth daily as needed.   Completed Course at Unknown time   Scheduled: . calcium-vitamin D  1 tablet Oral Daily  . Chlorhexidine Gluconate Cloth  6 each Topical Q0600  . docusate sodium  100 mg Oral Daily  . dorzolamide  1 drop Both Eyes BID  . gabapentin  200 mg Oral TID  . heparin injection (subcutaneous)  5,000 Units Subcutaneous Q8H  . latanoprost  1 drop Both Eyes QHS  . multivitamin-lutein  1 capsule Oral q morning - 10a  . [START ON 01/01/2018] mupirocin ointment  1 application Nasal BID  . NIFEdipine  30 mg Oral Daily  . pantoprazole  40 mg Oral Daily  . polyethylene glycol  17 g Oral Daily  . timolol  1 drop Both Eyes BID   Continuous: . ceFEPime (MAXIPIME) IV    . [START ON 01/01/2018] vancomycin     OMB:[id number], ipratropium-albuterol, traMADol  Assesment: She was admitted with sepsis.  This is likely from cellulitis.  There was concern that she might have pneumonia but CT does not show that.  I have personally reviewed the CT.  She has chronic kidney disease.  She may have some element of heart failure as she had a lot of swelling of her legs. Principal Problem:   Sepsis ([place]) Active Problems:   HTN (hypertension)   CKD (chronic kidney disease) stage 4, GFR 15-29 ml/min (HCC)   Pressure injury of skin   Cellulitis of left leg   HCAP (healthcare-associated pneumonia)   Glaucoma (increased eye pressure)    Plan: Continue antibiotics.  Continue other treatments. Check echocardiogram.  Discussed with daughter at bedside   LOS: 1 day   Ophelia Sipe L 12/31/2017, 8:50 AM

## 2018-01-01 MED ORDER — BISACODYL 10 MG RE SUPP
10.0000 mg | Freq: Once | RECTAL | Status: AC
  Administered 2018-01-01: 10 mg via RECTAL
  Filled 2018-01-01: qty 1

## 2018-01-01 NOTE — Progress Notes (Signed)
Subjective: She says she feels better.  She is very hard of hearing so communication is difficult.  She has not had any more fever.  Blood cultures are negative so far.  Her CT did not show pneumonia she is having some sundowning  Objective: Vital signs in last 24 hours: Temp:  [98 F (36.7 C)-98.9 F (37.2 C)] 98.9 F (37.2 C) (07/17 0605) Pulse Rate:  [70-93] 85 (07/17 0605) Resp:  [19-20] 20 (07/17 0605) BP: (114-139)/(68-86) 114/78 (07/17 0605) SpO2:  [90 %-96 %] 96 % (07/17 0605) Weight:  [61.9 kg (136 lb 7.4 oz)] 61.9 kg (136 lb 7.4 oz) (07/17 0500) Weight change: 6.108 kg (13 lb 7.4 oz) Last BM Date: (Patient unable to state)  Intake/Output from previous day: 07/16 0701 - 07/17 0700 In: 580 [P.O.:480; IV Piggyback:100] Out: 1200 [Urine:1200]  PHYSICAL EXAM General appearance: alert and Mildly confused and very hard of hearing Resp: clear to auscultation bilaterally Cardio: regular rate and rhythm, S1, S2 normal, no murmur, click, rub or gallop GI: soft, non-tender; bowel sounds normal; no masses,  no organomegaly Extremities: The wounds on both legs look somewhat better.  Lab Results:  Results for orders placed or performed during the hospital encounter of 12/30/17 (from the past 48 hour(s))  Urinalysis, Routine w reflex microscopic     Status: Abnormal   Collection Time: 12/30/17  9:15 AM  Result Value Ref Range   Color, Urine YELLOW YELLOW   APPearance HAZY (A) CLEAR   Specific Gravity, Urine 1.013 1.005 - 1.030   pH 5.0 5.0 - 8.0   Glucose, UA NEGATIVE NEGATIVE mg/dL   Hgb urine dipstick NEGATIVE NEGATIVE   Bilirubin Urine NEGATIVE NEGATIVE   Ketones, ur NEGATIVE NEGATIVE mg/dL   Protein, ur NEGATIVE NEGATIVE mg/dL   Nitrite NEGATIVE NEGATIVE   Leukocytes, UA NEGATIVE NEGATIVE    Comment: Performed at [facility], [address] [place], [place] [address]  Urine culture     Status: None   Collection Time: 12/30/17  9:15 AM  Result Value Ref Range    Specimen Description      URINE, CATHETERIZED Performed at [facility], [address] [place], [place] [address]    Special Requests      keflex Performed at [facility], [address], [place], [place] [address]    Culture      NO GROWTH Performed at [facility], [place] [address] [place], [place] [address]    Report Status 12/31/2017 FINAL   Comprehensive metabolic panel     Status: Abnormal   Collection Time: 12/30/17  9:42 AM  Result Value Ref Range   Sodium 134 (L) 135 - 145 mmol/L   Potassium 5.1 3.5 - 5.1 mmol/L   Chloride 97 (L) 98 - 111 mmol/L    Comment: Please note change in reference range.   CO2 28 22 - 32 mmol/L   Glucose, Bld 108 (H) 70 - 99 mg/dL    Comment: Please note change in reference range.   BUN 42 (H) 8 - 23 mg/dL    Comment: Please note change in reference range.   Creatinine, Ser 1.78 (H) 0.44 - 1.00 mg/dL   Calcium 8.7 (L) 8.9 - 10.3 mg/dL   Total Protein 5.4 (L) 6.5 - 8.1 g/dL   Albumin 2.4 (L) 3.5 - 5.0 g/dL   AST 20 15 - 41 U/L   ALT 16 0 - 44 U/L    Comment: Please note change in reference range.  Alkaline Phosphatase 72 38 - 126 U/L   Total Bilirubin 0.6 0.3 - 1.2 mg/dL   GFR calc non Af Amer 24 (L) >60 mL/min   GFR calc Af Amer 28 (L) >60 mL/min    Comment: (NOTE) The eGFR has been calculated using the CKD EPI equation. This calculation has not been validated in all clinical situations. eGFR's persistently <60 mL/min signify possible Chronic Kidney Disease.    Anion gap 9 5 - 15    Comment: Performed at [facility], [address], [place], [place] [address]  CBC with Differential     Status: Abnormal   Collection Time: 12/30/17  9:42 AM  Result Value Ref Range   WBC 9.7 4.0 - 10.5 K/uL   RBC 3.41 (L) 3.87 - 5.11 MIL/uL   Hemoglobin 11.3 (L) 12.0 - 15.0 g/dL   HCT 34.7 (L) 36.0 - 46.0 %   MCV 101.8 (H) 78.0 - 100.0 fL   MCH 33.1 26.0 - 34.0 pg   MCHC 32.6 30.0 - 36.0 g/dL   RDW 15.3 11.5 - 15.5 %   Platelets 138 (L) 150  - 400 K/uL   Neutrophils Relative % 76 %   Lymphocytes Relative 15 %   Monocytes Relative 8 %   Eosinophils Relative 1 %   Basophils Relative 0 %   Neutro Abs 7.3 1.7 - 7.7 K/uL   Lymphs Abs 1.5 0.7 - 4.0 K/uL   Monocytes Absolute 0.8 0.1 - 1.0 K/uL   Eosinophils Absolute 0.1 0.0 - 0.7 K/uL   Basophils Absolute 0.0 0.0 - 0.1 K/uL   RBC Morphology RARE NRBCs    WBC Morphology MILD LEFT SHIFT (1-5% METAS, OCC MYELO, OCC BANDS)     Comment: Performed at [facility], [address] [place], [place] [address]  Protime-INR     Status: None   Collection Time: 12/30/17  9:42 AM  Result Value Ref Range   Prothrombin Time 13.6 11.4 - 15.2 seconds   INR 1.05     Comment: Performed at [facility], [address], [place], [place] [address]  Culture, blood (Routine x 2)     Status: None (Preliminary result)   Collection Time: 12/30/17  9:42 AM  Result Value Ref Range   Specimen Description BLOOD RIGHT FOREARM DRAWN BY RN    Special Requests      BOTTLES DRAWN AEROBIC ONLY Blood Culture results may not be optimal due to an inadequate volume of blood received in culture bottles   Culture      NO GROWTH 2 DAYS Performed at [facility], [address] [place], [place] [address]    Report Status PENDING   Culture, blood (Routine x 2)     Status: None (Preliminary result)   Collection Time: 12/30/17  9:42 AM  Result Value Ref Range   Specimen Description BLOOD LEFT ARM DRAWN BY RN    Special Requests      BOTTLES DRAWN AEROBIC AND ANAEROBIC Blood Culture adequate volume   Culture      NO GROWTH 2 DAYS Performed at [facility], [address], [place], [place] [address]    Report Status PENDING   Lactic acid, plasma     Status: None   Collection Time: 12/30/17  9:42 AM  Result Value Ref Range   Lactic Acid, Venous 1.3 0.5 - 1.9 mmol/L    Comment: Performed at [facility], [address] [place], [place] [address]  Lactic acid, plasma     Status:  None   Collection Time: 12/30/17 12:03  PM  Result Value Ref Range   Lactic Acid, Venous 1.3 0.5 - 1.9 mmol/L    Comment: Performed at [facility], [address], [place], [place] [address]  MRSA PCR Screening     Status: Abnormal   Collection Time: 12/30/17  1:17 PM  Result Value Ref Range   MRSA by PCR POSITIVE (A) NEGATIVE    Comment:        The GeneXpert MRSA Assay (FDA approved for NASAL specimens only), is one component of a comprehensive MRSA colonization surveillance program. It is not intended to diagnose MRSA infection nor to guide or monitor treatment for MRSA infections. RESULT CALLED TO, READ BACK BY AND VERIFIED WITH: TAYLOR,RN AT 1813 ON 7.15.2019 BY ISLEY,B Performed at [facility], [address] [place], [place] [address]   Magnesium     Status: None   Collection Time: 12/30/17  5:42 PM  Result Value Ref Range   Magnesium 1.9 1.7 - 2.4 mg/dL    Comment: Performed at [facility], [address], [place], [place] [address]  Phosphorus     Status: None   Collection Time: 12/30/17  5:42 PM  Result Value Ref Range   Phosphorus 3.6 2.5 - 4.6 mg/dL    Comment: Performed at [facility], [address] [place], [place] [address]  Culture, blood (routine x 2) Call MD if unable to obtain prior to antibiotics being given     Status: None (Preliminary result)   Collection Time: 12/30/17  5:42 PM  Result Value Ref Range   Specimen Description BLOOD LEFT HAND    Special Requests      BOTTLES DRAWN AEROBIC ONLY Blood Culture adequate volume   Culture      NO GROWTH 2 DAYS Performed at [facility], [address] [place], [place] [address]    Report Status PENDING   Culture, blood (routine x 2) Call MD if unable to obtain prior to antibiotics being given     Status: None (Preliminary result)   Collection Time: 12/30/17 11:29 PM  Result Value Ref Range   Specimen Description BLOOD RIGHT ARM    Special Requests      BOTTLES DRAWN AEROBIC AND ANAEROBIC Blood Culture adequate volume   Culture      NO  GROWTH 2 DAYS Performed at [facility], [address], [place], [place] [address]    Report Status PENDING   Strep pneumoniae urinary antigen     Status: None   Collection Time: 12/31/17  4:00 AM  Result Value Ref Range   Strep Pneumo Urinary Antigen NEGATIVE NEGATIVE    Comment:        Infection due to S. pneumoniae cannot be absolutely ruled out since the antigen present may be below the detection limit of the test. Performed at [facility], [address], [place],  [address]   Basic metabolic panel     Status: Abnormal   Collection Time: 12/31/17  5:50 AM  Result Value Ref Range   Sodium 137 135 - 145 mmol/L   Potassium 4.3 3.5 - 5.1 mmol/L   Chloride 105 98 - 111 mmol/L    Comment: Please note change in reference range.   CO2 23 22 - 32 mmol/L   Glucose, Bld 97 70 - 99 mg/dL    Comment: Please note change in reference range.   BUN 36 (H) 8 - 23 mg/dL    Comment: Please note  change in reference range.   Creatinine, Ser 1.57 (H) 0.44 - 1.00 mg/dL   Calcium 8.5 (L) 8.9 - 10.3 mg/dL   GFR calc non Af Amer 28 (L) >60 mL/min   GFR calc Af Amer 32 (L) >60 mL/min    Comment: (NOTE) The eGFR has been calculated using the CKD EPI equation. This calculation has not been validated in all clinical situations. eGFR's persistently <60 mL/min signify possible Chronic Kidney Disease.    Anion gap 9 5 - 15    Comment: Performed at [facility], [address], [place], [place] [address]  CBC     Status: Abnormal   Collection Time: 12/31/17  5:50 AM  Result Value Ref Range   WBC 12.3 (H) 4.0 - 10.5 K/uL   RBC 3.27 (L) 3.87 - 5.11 MIL/uL   Hemoglobin 10.8 (L) 12.0 - 15.0 g/dL   HCT 33.4 (L) 36.0 - 46.0 %   MCV 102.1 (H) 78.0 - 100.0 fL   MCH 33.0 26.0 - 34.0 pg   MCHC 32.3 30.0 - 36.0 g/dL   RDW 15.3 11.5 - 15.5 %   Platelets 125 (L) 150 - 400 K/uL    Comment: Performed at [facility], [address], [place], [place] [address]    ABGS No results for input(s):  PHART, PO2ART, TCO2, HCO3 in the last 72 hours.  Invalid input(s): PCO2 CULTURES Recent Results (from the past 240 hour(s))  Urine culture     Status: None   Collection Time: 12/30/17  9:15 AM  Result Value Ref Range Status   Specimen Description   Final    URINE, CATHETERIZED Performed at [facility], [address], [place], [place] [address]    Special Requests   Final    keflex Performed at [facility], [address], [place], [place] [address]    Culture   Final    NO GROWTH Performed at [facility], [place] [address] [place], [place] [address]    Report Status 12/31/2017 FINAL  Final  Culture, blood (Routine x 2)     Status: None (Preliminary result)   Collection Time: 12/30/17  9:42 AM  Result Value Ref Range Status   Specimen Description BLOOD RIGHT FOREARM DRAWN BY RN  Final   Special Requests   Final    BOTTLES DRAWN AEROBIC ONLY Blood Culture results may not be optimal due to an inadequate volume of blood received in culture bottles   Culture   Final    NO GROWTH 2 DAYS Performed at [facility], [address], [place], [place] [address]    Report Status PENDING  Incomplete  Culture, blood (Routine x 2)     Status: None (Preliminary result)   Collection Time: 12/30/17  9:42 AM  Result Value Ref Range Status   Specimen Description BLOOD LEFT ARM DRAWN BY RN  Final   Special Requests   Final    BOTTLES DRAWN AEROBIC AND ANAEROBIC Blood Culture adequate volume   Culture   Final    NO GROWTH 2 DAYS Performed at [facility], [address] [place], [place] [address]    Report Status PENDING  Incomplete  MRSA PCR Screening     Status: Abnormal   Collection Time: 12/30/17  1:17 PM  Result Value Ref Range Status   MRSA by PCR POSITIVE (A) NEGATIVE Final    Comment:        The GeneXpert MRSA Assay (FDA approved for NASAL specimens only), is one  component of a comprehensive MRSA colonization surveillance program. It is not intended to diagnose  MRSA infection nor to guide or monitor treatment for MRSA infections. RESULT CALLED TO, READ BACK BY AND VERIFIED WITH: TAYLOR,RN AT 1813 ON 7.15.2019 BY ISLEY,B Performed at [facility], [address], [place], [place] [address]   Culture, blood (routine x 2) Call MD if unable to obtain prior to antibiotics being given     Status: None (Preliminary result)   Collection Time: 12/30/17  5:42 PM  Result Value Ref Range Status   Specimen Description BLOOD LEFT HAND  Final   Special Requests   Final    BOTTLES DRAWN AEROBIC ONLY Blood Culture adequate volume   Culture   Final    NO GROWTH 2 DAYS Performed at [facility], [address], [place], [place] [address]    Report Status PENDING  Incomplete  Culture, blood (routine x 2) Call MD if unable to obtain prior to antibiotics being given     Status: None (Preliminary result)   Collection Time: 12/30/17 11:29 PM  Result Value Ref Range Status   Specimen Description BLOOD RIGHT ARM  Final   Special Requests   Final    BOTTLES DRAWN AEROBIC AND ANAEROBIC Blood Culture adequate volume   Culture   Final    NO GROWTH 2 DAYS Performed at [facility], [address], [place], [place] [address]    Report Status PENDING  Incomplete   Studies/Results: Ct Chest Wo Contrast  Result Date: 12/31/2017 CLINICAL DATA:  Shortness of breath, fever, hypoxia, and tachycardia. EXAM: CT CHEST WITHOUT CONTRAST TECHNIQUE: Multidetector CT imaging of the chest was performed following the standard protocol without IV contrast. COMPARISON:  Chest x-rays dated 12/30/2017, 08/31/2015, and 03/25/2015 FINDINGS: Cardiovascular: Heart size is normal. Aortic atherosclerosis. Extensive coronary artery calcifications. Tortuous proximal brachiocephalic vessels are responsible for the slight widening of the superior mediastinum. Mediastinum/Nodes: No enlarged mediastinal or axillary lymph nodes. Thyroid gland, trachea, and esophagus demonstrate no significant findings.  Lungs/Pleura: There are small bilateral pleural effusions with slight atelectasis at the lung bases. Upper Abdomen: No acute abnormality. Musculoskeletal: No chest wall mass or suspicious bone lesions identified. IMPRESSION: 1. Small bilateral pleural effusions with adjacent bibasilar atelectasis. 2. Slight accentuation of the interstitial markings without discrete interstitial edema, nonspecific. 3.  Aortic Atherosclerosis (ICD10-I70.0). Electronically Signed   By: Lorriane Shire M.D.   On: 12/31/2017 08:29   Dg Chest Port 1 View  Result Date: 12/30/2017 CLINICAL DATA:  Altered mental status. EXAM: PORTABLE CHEST 1 VIEW COMPARISON:  PA and lateral chest 08/31/2015 and 05/25/2015. FINDINGS: Lung volumes are lower than on the comparison examinations with associated basilar atelectasis. Heart size is normal. No pneumothorax or pleural fluid. Aortic atherosclerosis is noted. No acute or focal bony abnormality. IMPRESSION: Bibasilar subsegmental atelectasis in a low volume chest. No acute disease. Electronically Signed   By: Inge Rise M.D.   On: 12/30/2017 10:21    Medications:  Prior to Admission:  Medications Prior to Admission  Medication Sig Dispense Refill Last Dose  . acetaminophen (TYLENOL) 500 MG tablet Take 500 mg by mouth every 6 (six) hours as needed (pain).    Past Week at Unknown time  . Calcium 600-200 MG-UNIT per tablet Take 1 tablet by mouth daily.     12/29/2017 at 0800  . docusate sodium (COLACE) 100 MG capsule Take 100 mg by mouth daily.   12/29/2017 at 0800  . dorzolamide-timolol (COSOPT) 22.3-6.8 MG/ML ophthalmic solution Place  1 drop into both eyes 2 (two) times daily.     12/29/2017 at 2000  . furosemide (LASIX) 20 MG tablet Take 20 mg by mouth daily. Take 1 tablet by mouth once a day for 7 days   12/29/2017 at 0800  . gabapentin (NEURONTIN) 100 MG capsule Take 2 capsules (200 mg total) by mouth 3 (three) times daily.   12/29/2017 at 1800  . losartan (COZAAR) 50 MG tablet Take  50 mg by mouth daily.    12/29/2017 at 0800  . LUMIGAN 0.01 % SOLN Place 1 drop into both eyes at bedtime.    12/29/2017 at 2200  . Melatonin 5 MG TABS Take by mouth at bedtime.   12/29/2017 at 2200  . Multiple Vitamins-Minerals (PRESERVISION AREDS 2 PO) Take 2 tablets by mouth every morning. One tablet daily    12/29/2017 at 0800  . NIFEdipine (PROCARDIA-XL/ADALAT-CC/NIFEDICAL-XL) 30 MG 24 hr tablet Take 30 mg by mouth daily.  11 12/29/2017 at 0800  . oxybutynin (DITROPAN-XL) 10 MG 24 hr tablet Take 10 mg by mouth daily.    12/29/2017 at 0800  . Oxycodone HCl 10 MG TABS Take 1 mg by mouth 2 (two) times daily.   12/29/2017 at 2000  . pantoprazole (PROTONIX) 40 MG tablet Take 40 mg by mouth daily.  12 12/29/2017 at 0800  . polyethylene glycol (MIRALAX / GLYCOLAX) packet Take 17 g by mouth daily.   12/29/2017 at 0800  . potassium chloride SA (K-DUR,KLOR-CON) 20 MEQ tablet Take 20 mEq by mouth daily. Take 1 tablet by mouth once a day for 10 days   12/29/2017 at 0800  . promethazine (PHENERGAN) 12.5 MG tablet Take 1 tablet (12.5 mg total) by mouth every 6 (six) hours as needed for nausea or vomiting. 30 tablet 0 12/11/2017 at Unknown time  . traMADol (ULTRAM) 50 MG tablet Take 1 tablet (50 mg total) by mouth every 6 (six) hours as needed. (Patient taking differently: Take 25-50 mg by mouth every 6 (six) hours as needed. ) 60 tablet 5 12/11/2017 at Unknown time  . predniSONE (DELTASONE) 10 MG tablet Take 10 mg by mouth daily as needed.   Completed Course at Unknown time   Scheduled: . calcium-vitamin D  1 tablet Oral Daily  . Chlorhexidine Gluconate Cloth  6 each Topical Q0600  . docusate sodium  100 mg Oral Daily  . dorzolamide  1 drop Both Eyes BID  . gabapentin  200 mg Oral TID  . heparin injection (subcutaneous)  5,000 Units Subcutaneous Q8H  . latanoprost  1 drop Both Eyes QHS  . multivitamin-lutein  1 capsule Oral q morning - 10a  . mupirocin ointment  1 application Nasal BID  . NIFEdipine  30 mg Oral  Daily  . pantoprazole  40 mg Oral Daily  . polyethylene glycol  17 g Oral Daily  . timolol  1 drop Both Eyes BID   Continuous: . sodium chloride 50 mL/hr at 12/31/17 1521  . ceFEPime (MAXIPIME) IV Stopped (12/31/17 1055)  . vancomycin     TDD:[id number], ipratropium-albuterol, traMADol  Assesment: She was admitted with sepsis presumably from cellulitis of her leg.  There was concern that she had a healthcare associated pneumonia but that is not present on CT.  She has stage IV chronic kidney disease which is stable  She has multiple skin injuries  She has dementia with sundowning Principal Problem:   Sepsis ([place]) Active Problems:   HTN (hypertension)   CKD (chronic kidney disease) stage 4,  GFR 15-29 ml/min (HCC)   Pressure injury of skin   Cellulitis of left leg   HCAP (healthcare-associated pneumonia)   Glaucoma (increased eye pressure)    Plan: Continue IV antibiotics    LOS: 2 days   Savannha Welle L 01/01/2018, 8:58 AM

## 2018-01-01 NOTE — Care Management Important Message (Signed)
Important Message  Patient Details  Name: Cindy Schultz MRN: [id number] Date of Birth: September 27, 1923   Medicare Important Message Given:  Yes    [name] 01/01/2018, 11:37 AM

## 2018-01-02 ENCOUNTER — Inpatient Hospital Stay (HOSPITAL_COMMUNITY): Payer: Medicare Other

## 2018-01-02 LAB — LEGIONELLA PNEUMOPHILA SEROGP 1 UR AG: L. PNEUMOPHILA SEROGP 1 UR AG: NEGATIVE

## 2018-01-02 MED ORDER — FUROSEMIDE 10 MG/ML IJ SOLN
40.0000 mg | Freq: Once | INTRAMUSCULAR | Status: AC
  Administered 2018-01-02: 40 mg via INTRAVENOUS
  Filled 2018-01-02: qty 4

## 2018-01-02 NOTE — Progress Notes (Signed)
Subjective: She is complaining of difficulty moving her right arm.  No other new complaints.  She is very weak.  She is very hard of hearing and communication is difficult  Objective: Vital signs in last 24 hours: Temp:  [97.7 F (36.5 C)-98.6 F (37 C)] 98.6 F (37 C) (07/18 0500) Pulse Rate:  [88-92] 88 (07/18 0500) Resp:  [18-20] 18 (07/18 0500) BP: (122-127)/(62-84) 124/82 (07/18 0500) SpO2:  [93 %-96 %] 96 % (07/18 0500) Weight:  [62 kg (136 lb 11 oz)-63.1 kg (139 lb 1.8 oz)] 63.1 kg (139 lb 1.8 oz) (07/18 0500) Weight change: 0.1 kg (3.5 oz) Last BM Date: 01/02/18  Intake/Output from previous day: 07/17 0701 - 07/18 0700 In: 960 [P.O.:960] Out: 800 [Urine:800]  PHYSICAL EXAM General appearance: alert and mild distress Resp: clear to auscultation bilaterally Cardio: regular rate and rhythm, S1, S2 normal, no murmur, click, rub or gallop GI: soft, non-tender; bowel sounds normal; no masses,  no organomegaly Extremities: She has edema of both feet. She has some pitting diffusely and I think it is probably third spacing of fluid related to her nutritional status.    Lab Results:  No results found for this or any previous visit (from the past 48 hour(s)).  ABGS No results for input(s): PHART, PO2ART, TCO2, HCO3 in the last 72 hours.  Invalid input(s): PCO2 CULTURES Recent Results (from the past 240 hour(s))  Urine culture     Status: None   Collection Time: 12/30/17  9:15 AM  Result Value Ref Range Status   Specimen Description   Final    URINE, CATHETERIZED Performed at [facility], [address], [place], [place] [address]    Special Requests   Final    keflex Performed at [facility], [address] [place], [place] [address]    Culture   Final    NO GROWTH Performed at [facility], [place] [address] [place], [place] [address]    Report Status 12/31/2017 FINAL  Final  Culture, blood (Routine x 2)     Status: None (Preliminary result)   Collection  Time: 12/30/17  9:42 AM  Result Value Ref Range Status   Specimen Description BLOOD RIGHT FOREARM DRAWN BY RN  Final   Special Requests   Final    BOTTLES DRAWN AEROBIC ONLY Blood Culture results may not be optimal due to an inadequate volume of blood received in culture bottles   Culture   Final    NO GROWTH 3 DAYS Performed at [facility], [address], [place], [place] [address]    Report Status PENDING  Incomplete  Culture, blood (Routine x 2)     Status: None (Preliminary result)   Collection Time: 12/30/17  9:42 AM  Result Value Ref Range Status   Specimen Description BLOOD LEFT ARM DRAWN BY RN  Final   Special Requests   Final    BOTTLES DRAWN AEROBIC AND ANAEROBIC Blood Culture adequate volume   Culture   Final    NO GROWTH 3 DAYS Performed at [facility], [address], [place],  [address]    Report Status PENDING  Incomplete  MRSA PCR Screening     Status: Abnormal   Collection Time: 12/30/17  1:17 PM  Result Value Ref Range Status   MRSA by PCR POSITIVE (A) NEGATIVE Final    Comment:        The GeneXpert MRSA Assay (FDA approved for NASAL specimens only), is one component of a comprehensive  MRSA colonization surveillance program. It is not intended to diagnose MRSA infection nor to guide or monitor treatment for MRSA infections. RESULT CALLED TO, READ BACK BY AND VERIFIED WITH: TAYLOR,RN AT 1813 ON 7.15.2019 BY ISLEY,B Performed at [facility], [address] [place], [place] [address]   Culture, blood (routine x 2) Call MD if unable to obtain prior to antibiotics being given     Status: None (Preliminary result)   Collection Time: 12/30/17  5:42 PM  Result Value Ref Range Status   Specimen Description BLOOD LEFT HAND  Final   Special Requests   Final    BOTTLES DRAWN AEROBIC ONLY Blood Culture adequate volume   Culture   Final    NO GROWTH 3 DAYS Performed at [facility], [address] [place], [place] [address]    Report Status PENDING   Incomplete  Culture, blood (routine x 2) Call MD if unable to obtain prior to antibiotics being given     Status: None (Preliminary result)   Collection Time: 12/30/17 11:29 PM  Result Value Ref Range Status   Specimen Description BLOOD RIGHT ARM  Final   Special Requests   Final    BOTTLES DRAWN AEROBIC AND ANAEROBIC Blood Culture adequate volume   Culture   Final    NO GROWTH 3 DAYS Performed at [facility], [address] [place], [place] [address]    Report Status PENDING  Incomplete   Studies/Results: No results found.  Medications:  Prior to Admission:  Medications Prior to Admission  Medication Sig Dispense Refill Last Dose  . acetaminophen (TYLENOL) 500 MG tablet Take 500 mg by mouth every 6 (six) hours as needed (pain).    Past Week at Unknown time  . Calcium 600-200 MG-UNIT per tablet Take 1 tablet by mouth daily.     12/29/2017 at 0800  . docusate sodium (COLACE) 100 MG capsule Take 100 mg by mouth daily.   12/29/2017 at 0800  . dorzolamide-timolol (COSOPT) 22.3-6.8 MG/ML ophthalmic solution Place 1 drop into both eyes 2 (two) times daily.     12/29/2017 at 2000  . furosemide (LASIX) 20 MG tablet Take 20 mg by mouth daily. Take 1 tablet by mouth once a day for 7 days   12/29/2017 at 0800  . gabapentin (NEURONTIN) 100 MG capsule Take 2 capsules (200 mg total) by mouth 3 (three) times daily.   12/29/2017 at 1800  . losartan (COZAAR) 50 MG tablet Take 50 mg by mouth daily.    12/29/2017 at 0800  . LUMIGAN 0.01 % SOLN Place 1 drop into both eyes at bedtime.    12/29/2017 at 2200  . Melatonin 5 MG TABS Take by mouth at bedtime.   12/29/2017 at 2200  . Multiple Vitamins-Minerals (PRESERVISION AREDS 2 PO) Take 2 tablets by mouth every morning. One tablet daily    12/29/2017 at 0800  . NIFEdipine (PROCARDIA-XL/ADALAT-CC/NIFEDICAL-XL) 30 MG 24 hr tablet Take 30 mg by mouth daily.  11 12/29/2017 at 0800  . oxybutynin (DITROPAN-XL) 10 MG 24 hr tablet Take 10 mg by mouth daily.    12/29/2017 at  0800  . Oxycodone HCl 10 MG TABS Take 1 mg by mouth 2 (two) times daily.   12/29/2017 at 2000  . pantoprazole (PROTONIX) 40 MG tablet Take 40 mg by mouth daily.  12 12/29/2017 at 0800  . polyethylene glycol (MIRALAX / GLYCOLAX) packet Take 17 g by mouth daily.   12/29/2017 at 0800  . potassium chloride SA (K-DUR,KLOR-CON) 20  MEQ tablet Take 20 mEq by mouth daily. Take 1 tablet by mouth once a day for 10 days   12/29/2017 at 0800  . promethazine (PHENERGAN) 12.5 MG tablet Take 1 tablet (12.5 mg total) by mouth every 6 (six) hours as needed for nausea or vomiting. 30 tablet 0 12/11/2017 at Unknown time  . traMADol (ULTRAM) 50 MG tablet Take 1 tablet (50 mg total) by mouth every 6 (six) hours as needed. (Patient taking differently: Take 25-50 mg by mouth every 6 (six) hours as needed. ) 60 tablet 5 12/11/2017 at Unknown time  . predniSONE (DELTASONE) 10 MG tablet Take 10 mg by mouth daily as needed.   Completed Course at Unknown time   Scheduled: . calcium-vitamin D  1 tablet Oral Daily  . Chlorhexidine Gluconate Cloth  6 each Topical Q0600  . docusate sodium  100 mg Oral Daily  . dorzolamide  1 drop Both Eyes BID  . furosemide  40 mg Intravenous Once  . gabapentin  200 mg Oral TID  . heparin injection (subcutaneous)  5,000 Units Subcutaneous Q8H  . latanoprost  1 drop Both Eyes QHS  . multivitamin-lutein  1 capsule Oral q morning - 10a  . mupirocin ointment  1 application Nasal BID  . NIFEdipine  30 mg Oral Daily  . pantoprazole  40 mg Oral Daily  . polyethylene glycol  17 g Oral Daily  . timolol  1 drop Both Eyes BID   Continuous: . ceFEPime (MAXIPIME) IV 1 g (01/02/18 0835)  . vancomycin 200 mL/hr at 01/01/18 1050   OTL:[id number], ipratropium-albuterol, traMADol  Assesment: She was admitted with sepsis.  This is apparently from her cellulitis of her leg.  She is on appropriate antibiotics for that.  There was concern that she had a healthcare associated pneumonia but that did not show on  her CT.  She has stage IV chronic kidney disease.  I discussed this with her daughter and her daughter has no interest in pursuing renal consult or dialysis.  She has hypertension which is fairly well controlled  She has dementia and has significant sundowning  She has third spacing of fluid which I think is a combination of nutritional problems plus fluid resuscitation for sepsis  She complains of difficulty moving her right arm Principal Problem:   Sepsis ([place]) Active Problems:   HTN (hypertension)   CKD (chronic kidney disease) stage 4, GFR 15-29 ml/min (HCC)   Pressure injury of skin   Cellulitis of left leg   HCAP (healthcare-associated pneumonia)   Glaucoma (increased eye pressure)    Plan: DC IV fluids.  Continue with antibiotics.  X-ray her shoulder.  She will have a single dose of Lasix today.  Continue local wound treatment to the injuries on her skin physical therapy consult    LOS: 3 days   Cindy Schultz L 01/02/2018, 8:46 AM

## 2018-01-03 LAB — VANCOMYCIN, TROUGH: Vancomycin Tr: 13 ug/mL — ABNORMAL LOW (ref 15–20)

## 2018-01-03 LAB — CREATININE, SERUM
Creatinine, Ser: 1.52 mg/dL — ABNORMAL HIGH (ref 0.44–1.00)
GFR calc non Af Amer: 29 mL/min — ABNORMAL LOW (ref >60)
GFR, EST AFRICAN AMERICAN: 33 mL/min — AB (ref >60)

## 2018-01-03 MED ORDER — ENSURE ENLIVE PO LIQD
237.0000 mL | Freq: Two times a day (BID) | ORAL | Status: DC
  Administered 2018-01-03 – 2018-01-06 (×6): 237 mL via ORAL

## 2018-01-03 NOTE — NC FL2 (Signed)
[place] LEVEL OF CARE SCREENING TOOL     IDENTIFICATION  Patient Name: Cindy Schultz Birthdate: 14-May-1924 Sex: female Admission Date (Current Location): 12/30/2017  [facility] and [place] Number:  [employer] and Address:  [place] [address] [place]      Provider Number: [id number]  Attending Physician Name and Address:  Sinda Du, MD  Relative Name and Phone Number:  Philemon Kingdom (daughter) [phone]    Current Level of Care: Hospital Recommended Level of Care: [place] Prior Approval Number:    Date Approved/Denied:   PASRR Number:    Discharge Plan: SNF    Current Diagnoses: Patient Active Problem List   Diagnosis Date Noted  . Sepsis ([place]) 12/30/2017  . Pressure injury of skin 12/30/2017  . Cellulitis of left leg 12/30/2017  . HCAP (healthcare-associated pneumonia) 12/30/2017  . Glaucoma (increased eye pressure) 12/30/2017  . Compression fracture of lumbar spine, non-traumatic, initial encounter ([place]) 12/17/2017  . Back pain 12/12/2017  . Abdominal pain 08/28/2017  . Primary osteoarthritis of right hip 07/19/2017  . Tear of right rotator cuff 07/19/2017  . CKD (chronic kidney disease) stage 3, GFR 30-59 ml/min (HCC) 05/22/2017  . Macrocytosis   . Diverticulosis of large intestine with hemorrhage   . Rectal bleed 05/19/2017  . CKD (chronic kidney disease) stage 4, GFR 15-29 ml/min (HCC) 05/19/2017  . LLQ pain 03/08/2017  . Colon polyp 03/08/2017  . Constipation 03/08/2017  . Acute blood loss anemia 09/10/2014  . GIB (gastrointestinal bleeding) 09/08/2014  . Occlusion and stenosis of carotid artery without mention of cerebral infarction 12/14/2013  . Osteoarthritis of left knee 04/23/2013  . Thumb pain 10/22/2012  . De Quervain's syndrome (tenosynovitis) 10/22/2012  . [employer] DJD(carpometacarpal degenerative joint disease), localized primary 10/22/2012  . CTS (carpal tunnel  syndrome) 10/22/2012  . Trigger thumb of right hand 10/22/2012  . Pes anserinus bursitis of right knee 06/26/2012  . OA (osteoarthritis) of knee 06/26/2012  . Leg pain, left 06/26/2012  . Radicular pain of left lower extremity 06/26/2012  . Diverticulosis of colon with hemorrhage 09/22/2011  . Syncopal episodes 09/20/2011  . GI bleed 09/19/2011  . HTN (hypertension) 09/19/2011  . Anemia due to blood loss, acute 09/19/2011  . Abnormality of gait 01/03/2011  . Personal history of fall 12/27/2010  . DISLOCATION CLOSED SHOULDER NEC 04/25/2010  . ANSERINE BURSITIS, LEFT 02/13/2010  . KNEE PAIN 12/22/2009  . LEG PAIN, BILATERAL 12/22/2009  . TOTAL KNEE FOLLOW-UP 12/22/2009  . HIP PAIN 02/07/2009  . LOW BACK PAIN 02/07/2009    Orientation RESPIRATION BLADDER Height & Weight     Self    Incontinent Weight: 137 lb 9.1 oz (62.4 kg) Height:  5\' 4"  (162.6 cm)  BEHAVIORAL SYMPTOMS/MOOD NEUROLOGICAL BOWEL NUTRITION STATUS      Incontinent (see dc summary)  AMBULATORY STATUS COMMUNICATION OF NEEDS Skin   Extensive Assist Verbally PU Stage and Appropriate Care(L heel discolored, intact skin, fluid filled blister, R elbow wound, bilateral lower extremity wounds, see dc summary and WOC RN notes for treatment plan)                       Personal Care Assistance Level of Assistance  Bathing, Feeding, Dressing Bathing Assistance: Maximum assistance Feeding assistance: Maximum assistance Dressing Assistance: Maximum assistance     Functional Limitations Info    Sight Info: Adequate Hearing Info: Impaired Speech Info: Adequate    SPECIAL  CARE FACTORS FREQUENCY  PT (By licensed PT), OT (By licensed OT)     PT Frequency: 5x/week OT Frequency: 3x/week            Contractures Contractures Info: Not present    Additional Factors Info  Code Status, Allergies Code Status Info: DNR Allergies Info: Codeine, Aspirin, Pepto-bismol, Hydrocodone           Current Medications  (01/03/2018):  This is the current hospital active medication list Current Facility-Administered Medications  Medication Dose Route Frequency Provider Last Rate Last Dose  . acetaminophen (TYLENOL) tablet 500 mg  500 mg Oral Q6H PRN Barton Dubois, MD      . calcium-vitamin D (OSCAL WITH D) 500-200 MG-UNIT per tablet 1 tablet  1 tablet Oral Daily Barton Dubois, MD   1 tablet at 01/03/18 0936  . ceFEPIme (MAXIPIME) 1 g in sodium chloride 0.9 % 100 mL IVPB  1 g Intravenous Q24H Sinda Du, MD   Stopped at 01/03/18 1019  . Chlorhexidine Gluconate Cloth 2 % PADS 6 each  6 each Topical Q0600 Barton Dubois, MD   6 each at 01/03/18 [phone]  . docusate sodium (COLACE) capsule 100 mg  100 mg Oral Daily Barton Dubois, MD   100 mg at 01/03/18 0936  . dorzolamide (TRUSOPT) 2 % ophthalmic solution 1 drop  1 drop Both Eyes BID Barton Dubois, MD   1 drop at 01/03/18 0936  . gabapentin (NEURONTIN) capsule 200 mg  200 mg Oral TID Barton Dubois, MD   200 mg at 01/03/18 0936  . heparin injection 5,000 Units  5,000 Units Subcutaneous Q8H Barton Dubois, MD   5,000 Units at 01/03/18 [phone]  . ipratropium-albuterol (DUONEB) 0.5-2.5 (3) MG/3ML nebulizer solution 3 mL  3 mL Nebulization Q6H PRN Barton Dubois, MD      . latanoprost (XALATAN) 0.005 % ophthalmic solution 1 drop  1 drop Both Eyes QHS Barton Dubois, MD   1 drop at 01/02/18 2133  . multivitamin-lutein (OCUVITE-LUTEIN) capsule 1 capsule  1 capsule Oral q morning - 10a Barton Dubois, MD   1 capsule at 01/03/18 0936  . mupirocin ointment (BACTROBAN) 2 % 1 application  1 application Nasal BID Barton Dubois, MD   1 application at 11/94/17 0936  . NIFEdipine (PROCARDIA-XL/ADALAT-CC/NIFEDICAL-XL) 24 hr tablet 30 mg  30 mg Oral Daily Barton Dubois, MD   30 mg at 01/03/18 0936  . pantoprazole (PROTONIX) EC tablet 40 mg  40 mg Oral Daily Barton Dubois, MD   40 mg at 01/03/18 0936  . polyethylene glycol (MIRALAX / GLYCOLAX) packet 17 g  17 g Oral Daily Barton Dubois, MD   17 g at 01/03/18 0936  . timolol (TIMOPTIC) 0.5 % ophthalmic solution 1 drop  1 drop Both Eyes BID Barton Dubois, MD   1 drop at 01/03/18 0937  . traMADol (ULTRAM) tablet 50 mg  50 mg Oral Q8H PRN Barton Dubois, MD   50 mg at 01/02/18 2133  . vancomycin (VANCOCIN) IVPB 1000 mg/200 mL premix  1,000 mg Intravenous Q48H Coffee, Donna Christen, RPH 200 mL/hr at 01/01/18 1050       Discharge Medications: Please see discharge summary for a list of discharge medications.  Relevant Imaging Results:  Relevant Lab Results:   Additional Information    Shade Flood, LCSW

## 2018-01-03 NOTE — Care Management Important Message (Signed)
Important Message  Patient Details  Name: Cindy Schultz MRN: [id number] Date of Birth: 21-Dec-1923   Medicare Important Message Given:  Yes    Orlandis Sanden, Chauncey Reading, RN 01/03/2018, 11:26 AM

## 2018-01-03 NOTE — Progress Notes (Signed)
Subjective: She is overall about the same.  She is still very weak.  She is is sleepy this morning.  No other new complaints  Objective: Vital signs in last 24 hours: Temp:  [97.5 F (36.4 C)-98 F (36.7 C)] 98 F (36.7 C) (07/18 2100) Pulse Rate:  [86-105] 86 (07/18 2100) Resp:  [18-19] 18 (07/18 2100) BP: (128-137)/(57-66) 128/66 (07/18 2100) SpO2:  [91 %-96 %] 96 % (07/18 2100) Weight:  [62.4 kg (137 lb 9.1 oz)] 62.4 kg (137 lb 9.1 oz) (07/19 0500) Weight change: 0.4 kg (14.1 oz) Last BM Date: 01/02/18  Intake/Output from previous day: 07/18 0701 - 07/19 0700 In: 1080 [P.O.:1080] Out: 1500 [Urine:1500]  PHYSICAL EXAM General appearance: Sleepy and confused Resp: clear to auscultation bilaterally Cardio: regular rate and rhythm, S1, S2 normal, no murmur, click, rub or gallop GI: soft, non-tender; bowel sounds normal; no masses,  no organomegaly Extremities: She still has skin lesions on her legs but it looks better  Lab Results:  Results for orders placed or performed during the hospital encounter of 12/30/17 (from the past 48 hour(s))  Creatinine, serum     Status: Abnormal   Collection Time: 01/03/18  5:57 AM  Result Value Ref Range   Creatinine, Ser 1.52 (H) 0.44 - 1.00 mg/dL   GFR calc non Af Amer 29 (L) >60 mL/min   GFR calc Af Amer 33 (L) >60 mL/min    Comment: (NOTE) The eGFR has been calculated using the CKD EPI equation. This calculation has not been validated in all clinical situations. eGFR's persistently <60 mL/min signify possible Chronic Kidney Disease. Performed at [facility], [address], [place], [place] [address]     ABGS No results for input(s): PHART, PO2ART, TCO2, HCO3 in the last 72 hours.  Invalid input(s): PCO2 CULTURES Recent Results (from the past 240 hour(s))  Urine culture     Status: None   Collection Time: 12/30/17  9:15 AM  Result Value Ref Range Status   Specimen Description   Final    URINE, CATHETERIZED Performed at  [facility], [address] [place], [place] [address]    Special Requests   Final    keflex Performed at [facility], [address] [place], [place] [address]    Culture   Final    NO GROWTH Performed at [facility], [place] [address], [place], [place] [address]    Report Status 12/31/2017 FINAL  Final  Culture, blood (Routine x 2)     Status: None (Preliminary result)   Collection Time: 12/30/17  9:42 AM  Result Value Ref Range Status   Specimen Description BLOOD RIGHT FOREARM DRAWN BY RN  Final   Special Requests   Final    BOTTLES DRAWN AEROBIC ONLY Blood Culture results may not be optimal due to an inadequate volume of blood received in culture bottles   Culture   Final    NO GROWTH 3 DAYS Performed at [facility], [address], [place], [place] [address]    Report Status PENDING  Incomplete  Culture, blood (Routine x 2)     Status: None (Preliminary result)   Collection Time: 12/30/17  9:42 AM  Result Value Ref Range Status   Specimen Description BLOOD LEFT ARM DRAWN BY RN  Final   Special Requests   Final    BOTTLES DRAWN AEROBIC AND ANAEROBIC Blood Culture adequate volume   Culture   Final    NO GROWTH 3 DAYS Performed at [facility], [address], [place], [place] [address]    Report Status PENDING  Incomplete  MRSA PCR Screening     Status: Abnormal   Collection Time: 12/30/17  1:17 PM  Result Value Ref Range Status   MRSA by PCR POSITIVE (A) NEGATIVE Final    Comment:        The GeneXpert MRSA Assay (FDA approved for NASAL specimens only), is one component of a comprehensive MRSA colonization surveillance program. It is not intended to diagnose MRSA infection nor to guide or monitor treatment for MRSA infections. RESULT CALLED TO, READ BACK BY AND VERIFIED WITH: TAYLOR,RN AT 1813 ON 7.15.2019 BY ISLEY,B Performed at [facility], [address], [place], [place] [address]   Culture, blood (routine x 2) Call MD if unable to obtain prior to  antibiotics being given     Status: None (Preliminary result)   Collection Time: 12/30/17  5:42 PM  Result Value Ref Range Status   Specimen Description BLOOD LEFT HAND  Final   Special Requests   Final    BOTTLES DRAWN AEROBIC ONLY Blood Culture adequate volume   Culture   Final    NO GROWTH 3 DAYS Performed at [facility], [address], [place], [place] [address]    Report Status PENDING  Incomplete  Culture, blood (routine x 2) Call MD if unable to obtain prior to antibiotics being given     Status: None (Preliminary result)   Collection Time: 12/30/17 11:29 PM  Result Value Ref Range Status   Specimen Description BLOOD RIGHT ARM  Final   Special Requests   Final    BOTTLES DRAWN AEROBIC AND ANAEROBIC Blood Culture adequate volume   Culture   Final    NO GROWTH 3 DAYS Performed at [facility], [address], [place], [place] [address]    Report Status PENDING  Incomplete   Studies/Results: Dg Shoulder Right Port  Result Date: 01/02/2018 CLINICAL DATA:  RIGHT SHOULDER PAIN, PT WOULD NOT GIVE ANY HISTORY EXAM: PORTABLE RIGHT SHOULDER COMPARISON:  Chest radiograph 12/30/2017, CT 12/31/2017 FINDINGS: Glenohumeral joint is intact. No evidence of scapular fracture or humeral fracture. The acromioclavicular joint is intact. There is narrowing of the acromial humeral interval superiorly. IMPRESSION: 1.  No fracture or dislocation. 2. Joint space narrowing suggests rotator cuff arthropathy. Electronically Signed   By: Suzy Bouchard M.D.   On: 01/02/2018 11:59    Medications:  Prior to Admission:  Medications Prior to Admission  Medication Sig Dispense Refill Last Dose  . acetaminophen (TYLENOL) 500 MG tablet Take 500 mg by mouth every 6 (six) hours as needed (pain).    Past Week at Unknown time  . Calcium 600-200 MG-UNIT per tablet Take 1 tablet by mouth daily.     12/29/2017 at 0800  . docusate sodium (COLACE) 100 MG capsule Take 100 mg by mouth daily.   12/29/2017 at 0800  .  dorzolamide-timolol (COSOPT) 22.3-6.8 MG/ML ophthalmic solution Place 1 drop into both eyes 2 (two) times daily.     12/29/2017 at 2000  . furosemide (LASIX) 20 MG tablet Take 20 mg by mouth daily. Take 1 tablet by mouth once a day for 7 days   12/29/2017 at 0800  . gabapentin (NEURONTIN) 100 MG capsule Take 2 capsules (200 mg total) by mouth 3 (three) times daily.   12/29/2017 at 1800  . losartan (COZAAR) 50 MG tablet Take 50 mg by mouth daily.    12/29/2017 at 0800  . LUMIGAN 0.01 %  SOLN Place 1 drop into both eyes at bedtime.    12/29/2017 at 2200  . Melatonin 5 MG TABS Take by mouth at bedtime.   12/29/2017 at 2200  . Multiple Vitamins-Minerals (PRESERVISION AREDS 2 PO) Take 2 tablets by mouth every morning. One tablet daily    12/29/2017 at 0800  . NIFEdipine (PROCARDIA-XL/ADALAT-CC/NIFEDICAL-XL) 30 MG 24 hr tablet Take 30 mg by mouth daily.  11 12/29/2017 at 0800  . oxybutynin (DITROPAN-XL) 10 MG 24 hr tablet Take 10 mg by mouth daily.    12/29/2017 at 0800  . Oxycodone HCl 10 MG TABS Take 1 mg by mouth 2 (two) times daily.   12/29/2017 at 2000  . pantoprazole (PROTONIX) 40 MG tablet Take 40 mg by mouth daily.  12 12/29/2017 at 0800  . polyethylene glycol (MIRALAX / GLYCOLAX) packet Take 17 g by mouth daily.   12/29/2017 at 0800  . potassium chloride SA (K-DUR,KLOR-CON) 20 MEQ tablet Take 20 mEq by mouth daily. Take 1 tablet by mouth once a day for 10 days   12/29/2017 at 0800  . promethazine (PHENERGAN) 12.5 MG tablet Take 1 tablet (12.5 mg total) by mouth every 6 (six) hours as needed for nausea or vomiting. 30 tablet 0 12/11/2017 at Unknown time  . traMADol (ULTRAM) 50 MG tablet Take 1 tablet (50 mg total) by mouth every 6 (six) hours as needed. (Patient taking differently: Take 25-50 mg by mouth every 6 (six) hours as needed. ) 60 tablet 5 12/11/2017 at Unknown time  . predniSONE (DELTASONE) 10 MG tablet Take 10 mg by mouth daily as needed.   Completed Course at Unknown time   Scheduled: .  calcium-vitamin D  1 tablet Oral Daily  . Chlorhexidine Gluconate Cloth  6 each Topical Q0600  . docusate sodium  100 mg Oral Daily  . dorzolamide  1 drop Both Eyes BID  . gabapentin  200 mg Oral TID  . heparin injection (subcutaneous)  5,000 Units Subcutaneous Q8H  . latanoprost  1 drop Both Eyes QHS  . multivitamin-lutein  1 capsule Oral q morning - 10a  . mupirocin ointment  1 application Nasal BID  . NIFEdipine  30 mg Oral Daily  . pantoprazole  40 mg Oral Daily  . polyethylene glycol  17 g Oral Daily  . timolol  1 drop Both Eyes BID   Continuous: . ceFEPime (MAXIPIME) IV Stopped (01/02/18 0948)  . vancomycin 200 mL/hr at 01/01/18 1050   TDS:[id number], ipratropium-albuterol, traMADol  Assesment: She was admitted with sepsis presumably from cellulitis.  There was concern that she had a healthcare associated pneumonia but that did not show on her CT.  She has chronic kidney disease which is stable.  Her cellulitis is improving.  She is no longer septic.  She had a fall and had been in a skilled care facility for rehab.  She has hypertension which is pretty well controlled  She has dementia and some confusion  She looked volume overloaded yesterday but less so today  She remains very weak Principal Problem:   Sepsis ([place]) Active Problems:   HTN (hypertension)   CKD (chronic kidney disease) stage 4, GFR 15-29 ml/min (HCC)   Pressure injury of skin   Cellulitis of left leg   HCAP (healthcare-associated pneumonia)   Glaucoma (increased eye pressure)    Plan: PT consultation pending.  She will continue with IV antibiotics for now.    LOS: 4 days   Justen Fonda L 01/03/2018, 8:40 AM

## 2018-01-03 NOTE — Progress Notes (Signed)
Pharmacy Antibiotic Note  Cindy Schultz is a 84 y.o. female admitted on 12/30/2017 with cellulitis. There was concern that she had a healthcare associated pneumonia but that did not show on her CT. Pharmacy has been consulted for vancomycin dosing.  Plan: Vanco trough 13 Continue Cefepime 1000 mg IV every 24 hours Continue Vancomycin 1000 mg IV every 48 hours. Goal trough 10-15 mcg/mL  Monitor labs, c/s, and patient improvement    Height: 5\' 4"  (162.6 cm) Weight: 137 lb 9.1 oz (62.4 kg) IBW/kg (Calculated) : 54.7  Temp (24hrs), Avg:98 F (36.7 C), Min:98 F (36.7 C), Max:98 F (36.7 C)  Recent Labs  Lab 12/30/17 0942 12/30/17 1203 12/31/17 0550 01/03/18 0557 01/03/18 1020  WBC 9.7  --  12.3*  --   --   CREATININE 1.78*  --  1.57* 1.52*  --   LATICACIDVEN 1.3 1.3  --   --   --   VANCOTROUGH  --   --   --   --  13*    Estimated Creatinine Clearance: 20.8 mL/min (A) (by C-G formula based on SCr of 1.52 mg/dL (H)).    Allergies  Allergen Reactions  . Codeine Nausea And Vomiting  . Aspirin     Cannot take due to Diverticulosis (bleeding side effects)  . Pepto-Bismol [Bismuth Subsalicylate]     Black stools  . Hydrocodone Other (See Comments)    Unknown Reaction     Antimicrobials this admission: Anti-infectives (From admission, onward)   Start     Dose/Rate Route Frequency Ordered Stop   01/01/18 1000  vancomycin (VANCOCIN) IVPB 1000 mg/200 mL premix     1,000 mg 200 mL/hr over 60 Minutes Intravenous Every 48 hours 12/30/17 1641     12/31/17 1000  ceFEPIme (MAXIPIME) 2 g in sodium chloride 0.9 % 100 mL IVPB  Status:  Discontinued     2 g 200 mL/hr over 30 Minutes Intravenous Every 24 hours 12/30/17 1616 12/31/17 0816   12/31/17 1000  ceFEPIme (MAXIPIME) 1 g in sodium chloride 0.9 % 100 mL IVPB     1 g 200 mL/hr over 30 Minutes Intravenous Every 24 hours 12/31/17 0816 01/08/18 0959   12/30/17 0945  ceFEPIme (MAXIPIME) 2 g in sodium chloride 0.9 % 100 mL IVPB     2 g 200 mL/hr over 30 Minutes Intravenous  Once 12/30/17 0931 12/30/17 1051   12/30/17 0945  vancomycin (VANCOCIN) IVPB 1000 mg/200 mL premix     1,000 mg 200 mL/hr over 60 Minutes Intravenous  Once 12/30/17 0931 12/30/17 1205       Microbiology results: 7/15 BCx: ngtd 7/15 UCx: NG  7/15 Sputum: pending 7/15 MRSA PCR: positive  Thank you for allowing pharmacy to be a part of this patient's care.  Margot Ables, PharmD Clinical Pharmacist 01/03/2018 1:39 PM

## 2018-01-03 NOTE — Clinical Social Work Note (Signed)
Spoke with pt's daughter, [name], by phone today to review dc planning. MD anticipating pt may be stable for dc by Monday. [name] would like for pt to return to [facility] at dc. Will re-start referral as pt's bed was not being held there.   Will follow up Monday to further assist with dc planning.

## 2018-01-03 NOTE — Evaluation (Signed)
Physical Therapy Evaluation Patient Details Name: Cindy Schultz MRN: [id number] DOB: 28-Jan-1924 Today's Date: 01/03/2018   History of Present Illness  Cindy Schultz is a 84 y.o. female with past medical history significant for chronic kidney disease stage IV, Dementia, GERD, hypertension, recent admission due to lumbar compression/status post kyphoplasty; who came from SNF due to increase confusion, fever, SOB and LLE swelling/erythema. Symptoms started 3 days ago or so, and worsening. Patient expressed not feeling good, but denies CP, nausea, vomiting, abd pain, dysuria, hematuria and blood in her stools (keeping in mind that the patient has underlying history of dementia I cannot truly elaborate much or being trusted in her symptoms description).    Clinical Impression  Patient limited for taking steps due to poor balance, generalized weakness and c/o back pain.  Patient able to take a few side steps to transfer to chair and tolerated sitting up in chair with family member present after therapy.  Patient will benefit from continued physical therapy in hospital and recommended venue below to increase strength, balance, endurance for safe ADLs and gait.    Follow Up Recommendations SNF    Equipment Recommendations       Recommendations for Other Services       Precautions / Restrictions Precautions Precautions: Fall Restrictions Weight Bearing Restrictions: No      Mobility  Bed Mobility Overal bed mobility: Needs Assistance Bed Mobility: Supine to Sit     Supine to sit: Max assist     General bed mobility comments: very anxious, apprehensive  Transfers Overall transfer level: Needs assistance Equipment used: Rolling walker (2 wheeled) Transfers: Sit to/from [facility] Sit to Stand: Mod assist Stand pivot transfers: Mod assist;Max assist          Ambulation/Gait Ambulation/Gait assistance: Max assist;Mod assist Gait Distance (Feet): 5  Feet Assistive device: Rolling walker (2 wheeled) Gait Pattern/deviations: Decreased step length - right;Decreased step length - left;Decreased stride length Gait velocity: slow   General Gait Details: limited to 5-6 slow unsteady side steps due to c/o fatigue, generalized weakness  Stairs            Wheelchair Mobility    Modified Rankin (Stroke Patients Only)       Balance Overall balance assessment: Needs assistance Sitting-balance support: No upper extremity supported;Feet supported Sitting balance-Leahy Scale: Fair     Standing balance support: Bilateral upper extremity supported;During functional activity Standing balance-Leahy Scale: Poor Standing balance comment: fair/poor with RW                             Pertinent Vitals/Pain Pain Assessment: Faces Faces Pain Scale: Hurts even more Pain Location: low back and BLE with pressure Pain Descriptors / Indicators: Discomfort;Grimacing;Sore Pain Intervention(s): Limited activity within patient's tolerance;Monitored during session    Home Living Family/patient expects to be discharged to:: Skilled nursing facility Living Arrangements: Alone Available Help at Discharge: Family;Available PRN/intermittently Type of Home: House Home Access: Stairs to enter Entrance Stairs-Rails: Left Entrance Stairs-Number of Steps: 3 Home Layout: One level Home Equipment: Walker - 2 wheels;Cane - single point;Bedside commode;Shower seat;Wheelchair - manual Additional Comments: Pt only uses SPC    Prior Function Level of Independence: Independent with assistive device(s)         Comments: community ambulator with SPC prior to fall     Hand Dominance   Dominant Hand: Right    Extremity/Trunk Assessment   Upper Extremity Assessment Upper Extremity  Assessment: Generalized weakness    Lower Extremity Assessment Lower Extremity Assessment: Generalized weakness    Cervical / Trunk Assessment Cervical /  Trunk Assessment: Kyphotic  Communication   Communication: HOH  Cognition Arousal/Alertness: Awake/alert Behavior During Therapy: WFL for tasks assessed/performed Overall Cognitive Status: Impaired/Different from baseline Area of Impairment: Orientation;Following commands;Safety/judgement;Awareness                       Following Commands: Follows one step commands with increased time;Follows multi-step commands inconsistently Safety/Judgement: Decreased awareness of safety            General Comments      Exercises     Assessment/Plan    PT Assessment Patient needs continued PT services  PT Problem List Decreased strength;Decreased activity tolerance;Decreased mobility;Decreased balance;Decreased safety awareness       PT Treatment Interventions Gait training;Functional mobility training;Therapeutic activities;Therapeutic exercise;Patient/family education;Stair training    PT Goals (Current goals can be found in the Care Plan section)  Acute Rehab PT Goals Patient Stated Goal: return home PT Goal Formulation: With patient/family Time For Goal Achievement: 01/20/18 Potential to Achieve Goals: Fair    Frequency Min 3X/week   Barriers to discharge        Co-evaluation               AM-PAC PT "6 Clicks" Daily Activity  Outcome Measure Difficulty turning over in bed (including adjusting bedclothes, sheets and blankets)?: A Lot Difficulty moving from lying on back to sitting on the side of the bed? : A Lot Difficulty sitting down on and standing up from a chair with arms (e.g., wheelchair, bedside commode, etc,.)?: A Lot Help needed moving to and from a bed to chair (including a wheelchair)?: A Lot Help needed walking in hospital room?: A Lot Help needed climbing 3-5 steps with a railing? : Total 6 Click Score: 11    End of Session Equipment Utilized During Treatment: Oxygen Activity Tolerance: Patient tolerated treatment well;Patient limited by  fatigue Patient left: in chair;with call bell/phone within reach;with chair alarm set;with family/visitor present Nurse Communication: Mobility status;Other (comment)(nursing staff informed that patient left up in chair) PT Visit Diagnosis: Unsteadiness on feet (R26.81);Other abnormalities of gait and mobility (R26.89);History of falling (Z91.81);Muscle weakness (generalized) (M62.81)    Time: 9518-8416 PT Time Calculation (min) (ACUTE ONLY): 34 min   Charges:   PT Evaluation $PT Eval Moderate Complexity: 1 Mod PT Treatments $Therapeutic Activity: 23-37 mins   PT G Codes:        12:19 PM, 2018/01/04 Lonell Grandchild, MPT Physical Therapist with [facility] [phone] office [phone] mobile phone

## 2018-01-03 NOTE — Plan of Care (Signed)
  Problem: Acute Rehab PT Goals(only PT should resolve) Goal: Pt Will Go Supine/Side To Sit Outcome: Progressing Flowsheets (Taken 01/03/2018 1221) Pt will go Supine/Side to Sit: with minimal assist Goal: Patient Will Transfer Sit To/From Stand Outcome: Progressing Flowsheets (Taken 01/03/2018 1221) Patient will transfer sit to/from stand: with minimal assist Goal: Pt Will Transfer Bed To Chair/Chair To Bed Outcome: Progressing Flowsheets (Taken 01/03/2018 1221) Pt will Transfer Bed to Chair/Chair to Bed: with min assist Goal: Pt Will Ambulate Outcome: Progressing Flowsheets (Taken 01/03/2018 1221) Pt will Ambulate: 25 feet;with moderate assist;with rolling walker   12:21 PM, 01/03/18 Lonell Grandchild, MPT Physical Therapist with [facility] [phone] office [phone] mobile phone

## 2018-01-04 LAB — CULTURE, BLOOD (ROUTINE X 2)
CULTURE: NO GROWTH
CULTURE: NO GROWTH
CULTURE: NO GROWTH
CULTURE: NO GROWTH
SPECIAL REQUESTS: ADEQUATE
SPECIAL REQUESTS: ADEQUATE
Special Requests: ADEQUATE

## 2018-01-04 LAB — CBC WITH DIFFERENTIAL/PLATELET
BASOS ABS: 0.1 10*3/uL (ref 0.0–0.1)
Basophils Relative: 1 %
EOS ABS: 0.2 10*3/uL (ref 0.0–0.7)
EOS PCT: 2 %
HCT: 34.7 % — ABNORMAL LOW (ref 36.0–46.0)
Hemoglobin: 11.1 g/dL — ABNORMAL LOW (ref 12.0–15.0)
LYMPHS PCT: 22 %
Lymphs Abs: 1.9 10*3/uL (ref 0.7–4.0)
MCH: 32.7 pg (ref 26.0–34.0)
MCHC: 32 g/dL (ref 30.0–36.0)
MCV: 102.4 fL — ABNORMAL HIGH (ref 78.0–100.0)
Monocytes Absolute: 1.5 10*3/uL — ABNORMAL HIGH (ref 0.1–1.0)
Monocytes Relative: 17 %
Neutro Abs: 5.2 10*3/uL (ref 1.7–7.7)
Neutrophils Relative %: 58 %
PLATELETS: 256 10*3/uL (ref 150–400)
RBC: 3.39 MIL/uL — AB (ref 3.87–5.11)
RDW: 15.5 % (ref 11.5–15.5)
WBC: 8.8 10*3/uL (ref 4.0–10.5)

## 2018-01-04 LAB — COMPREHENSIVE METABOLIC PANEL
ALBUMIN: 2.2 g/dL — AB (ref 3.5–5.0)
ALT: 25 U/L (ref 0–44)
AST: 31 U/L (ref 15–41)
Alkaline Phosphatase: 85 U/L (ref 38–126)
Anion gap: 9 (ref 5–15)
BUN: 29 mg/dL — AB (ref 8–23)
CHLORIDE: 106 mmol/L (ref 98–111)
CO2: 24 mmol/L (ref 22–32)
Calcium: 8.8 mg/dL — ABNORMAL LOW (ref 8.9–10.3)
Creatinine, Ser: 1.43 mg/dL — ABNORMAL HIGH (ref 0.44–1.00)
GFR calc Af Amer: 36 mL/min — ABNORMAL LOW (ref >60)
GFR calc non Af Amer: 31 mL/min — ABNORMAL LOW (ref >60)
Glucose, Bld: 91 mg/dL (ref 70–99)
Potassium: 3.9 mmol/L (ref 3.5–5.1)
SODIUM: 139 mmol/L (ref 135–145)
Total Bilirubin: 0.7 mg/dL (ref 0.3–1.2)
Total Protein: 5.5 g/dL — ABNORMAL LOW (ref 6.5–8.1)

## 2018-01-04 NOTE — Progress Notes (Signed)
Subjective: She is lying quietly in the bed.  She was sleeping when I came in but woke up.  She is very hard of hearing so communication is difficult.  She has no complaints.  Objective: Vital signs in last 24 hours: Temp:  [97.7 F (36.5 C)-98.6 F (37 C)] 97.7 F (36.5 C) (07/20 0556) Pulse Rate:  [70-103] 83 (07/20 0556) Resp:  [18-19] 18 (07/20 0556) BP: (113-156)/(79-98) 113/98 (07/20 0556) SpO2:  [98 %-100 %] 100 % (07/20 0556) Weight change:  Last BM Date: 01/03/18  Intake/Output from previous day: 07/19 0701 - 07/20 0700 In: 1080 [P.O.:1080] Out: 1175 [Urine:1175]  PHYSICAL EXAM General appearance: She looks comfortable.  She is confused.  She is very hard of hearing Resp: clear to auscultation bilaterally Cardio: regular rate and rhythm, S1, S2 normal, no murmur, click, rub or gallop GI: soft, non-tender; bowel sounds normal; no masses,  no organomegaly Extremities: Her leg wounds are improving  Lab Results:  Results for orders placed or performed during the hospital encounter of 12/30/17 (from the past 48 hour(s))  Creatinine, serum     Status: Abnormal   Collection Time: 01/03/18  5:57 AM  Result Value Ref Range   Creatinine, Ser 1.52 (H) 0.44 - 1.00 mg/dL   GFR calc non Af Amer 29 (L) >60 mL/min   GFR calc Af Amer 33 (L) >60 mL/min    Comment: (NOTE) The eGFR has been calculated using the CKD EPI equation. This calculation has not been validated in all clinical situations. eGFR's persistently <60 mL/min signify possible Chronic Kidney Disease. Performed at [facility], [address] [place], [place] [address]   Vancomycin, trough     Status: Abnormal   Collection Time: 01/03/18 10:20 AM  Result Value Ref Range   Vancomycin Tr 13 (L) 15 - 20 ug/mL    Comment: Performed at [facility], [address], [place], [place] [address]  CBC with Differential/Platelet     Status: Abnormal   Collection Time: 01/04/18  6:30 AM  Result Value Ref Range   WBC 8.8 4.0  - 10.5 K/uL   RBC 3.39 (L) 3.87 - 5.11 MIL/uL   Hemoglobin 11.1 (L) 12.0 - 15.0 g/dL   HCT 34.7 (L) 36.0 - 46.0 %   MCV 102.4 (H) 78.0 - 100.0 fL   MCH 32.7 26.0 - 34.0 pg   MCHC 32.0 30.0 - 36.0 g/dL   RDW 15.5 11.5 - 15.5 %   Platelets 256 150 - 400 K/uL   Neutrophils Relative % 58 %   Neutro Abs 5.2 1.7 - 7.7 K/uL   Lymphocytes Relative 22 %   Lymphs Abs 1.9 0.7 - 4.0 K/uL   Monocytes Relative 17 %   Monocytes Absolute 1.5 (H) 0.1 - 1.0 K/uL   Eosinophils Relative 2 %   Eosinophils Absolute 0.2 0.0 - 0.7 K/uL   Basophils Relative 1 %   Basophils Absolute 0.1 0.0 - 0.1 K/uL    Comment: Performed at [facility], [address], [place], [place] [address]  Comprehensive metabolic panel     Status: Abnormal   Collection Time: 01/04/18  6:30 AM  Result Value Ref Range   Sodium 139 135 - 145 mmol/L   Potassium 3.9 3.5 - 5.1 mmol/L   Chloride 106 98 - 111 mmol/L    Comment: Please note change in reference range.   CO2 24 22 - 32 mmol/L   Glucose, Bld 91 70 - 99 mg/dL    Comment: Please  note change in reference range.   BUN 29 (H) 8 - 23 mg/dL    Comment: Please note change in reference range.   Creatinine, Ser 1.43 (H) 0.44 - 1.00 mg/dL   Calcium 8.8 (L) 8.9 - 10.3 mg/dL   Total Protein 5.5 (L) 6.5 - 8.1 g/dL   Albumin 2.2 (L) 3.5 - 5.0 g/dL   AST 31 15 - 41 U/L   ALT 25 0 - 44 U/L    Comment: Please note change in reference range.   Alkaline Phosphatase 85 38 - 126 U/L   Total Bilirubin 0.7 0.3 - 1.2 mg/dL   GFR calc non Af Amer 31 (L) >60 mL/min   GFR calc Af Amer 36 (L) >60 mL/min    Comment: (NOTE) The eGFR has been calculated using the CKD EPI equation. This calculation has not been validated in all clinical situations. eGFR's persistently <60 mL/min signify possible Chronic Kidney Disease.    Anion gap 9 5 - 15    Comment: Performed at [facility], [address] [place], [place] [address]    ABGS No results for input(s): PHART, PO2ART, TCO2, HCO3 in the last  72 hours.  Invalid input(s): PCO2 CULTURES Recent Results (from the past 240 hour(s))  Urine culture     Status: None   Collection Time: 12/30/17  9:15 AM  Result Value Ref Range Status   Specimen Description   Final    URINE, CATHETERIZED Performed at [facility], [address] [place], [place] [address]    Special Requests   Final    keflex Performed at [facility], [address], [place], [place] [address]    Culture   Final    NO GROWTH Performed at [facility], [place] [address] [place], [place] [address]    Report Status 12/31/2017 FINAL  Final  Culture, blood (Routine x 2)     Status: None   Collection Time: 12/30/17  9:42 AM  Result Value Ref Range Status   Specimen Description BLOOD RIGHT FOREARM DRAWN BY RN  Final   Special Requests   Final    BOTTLES DRAWN AEROBIC ONLY Blood Culture results may not be optimal due to an inadequate volume of blood received in culture bottles   Culture   Final    NO GROWTH 5 DAYS Performed at [facility], [address] [place], [place] [address]    Report Status 01/04/2018 FINAL  Final  Culture, blood (Routine x 2)     Status: None   Collection Time: 12/30/17  9:42 AM  Result Value Ref Range Status   Specimen Description BLOOD LEFT ARM DRAWN BY RN  Final   Special Requests   Final    BOTTLES DRAWN AEROBIC AND ANAEROBIC Blood Culture adequate volume   Culture   Final    NO GROWTH 5 DAYS Performed at [facility], [address] [place], [place] [address]    Report Status 01/04/2018 FINAL  Final  MRSA PCR Screening     Status: Abnormal   Collection Time: 12/30/17  1:17 PM  Result Value Ref Range Status   MRSA by PCR POSITIVE (A) NEGATIVE Final    Comment:        The GeneXpert MRSA Assay (FDA approved for NASAL specimens only), is one component of a comprehensive MRSA colonization surveillance program. It is not intended to diagnose MRSA infection nor to guide or monitor treatment for MRSA infections. RESULT  CALLED TO, READ BACK BY AND  VERIFIED WITH: TAYLOR,RN AT 2979 ON 7.15.2019 BY ISLEY,B Performed at [facility], [address], [place], [place] [address]   Culture, blood (routine x 2) Call MD if unable to obtain prior to antibiotics being given     Status: None   Collection Time: 12/30/17  5:42 PM  Result Value Ref Range Status   Specimen Description BLOOD LEFT HAND  Final   Special Requests   Final    BOTTLES DRAWN AEROBIC ONLY Blood Culture adequate volume   Culture   Final    NO GROWTH 5 DAYS Performed at [facility], [address] [place], [place] [address]    Report Status 01/04/2018 FINAL  Final  Culture, blood (routine x 2) Call MD if unable to obtain prior to antibiotics being given     Status: None   Collection Time: 12/30/17 11:29 PM  Result Value Ref Range Status   Specimen Description BLOOD RIGHT ARM  Final   Special Requests   Final    BOTTLES DRAWN AEROBIC AND ANAEROBIC Blood Culture adequate volume   Culture   Final    NO GROWTH 5 DAYS Performed at [facility], [address], [place], [place] [address]    Report Status 01/04/2018 FINAL  Final   Studies/Results: Dg Shoulder Right Port  Result Date: 01/02/2018 CLINICAL DATA:  RIGHT SHOULDER PAIN, PT WOULD NOT GIVE ANY HISTORY EXAM: PORTABLE RIGHT SHOULDER COMPARISON:  Chest radiograph 12/30/2017, CT 12/31/2017 FINDINGS: Glenohumeral joint is intact. No evidence of scapular fracture or humeral fracture. The acromioclavicular joint is intact. There is narrowing of the acromial humeral interval superiorly. IMPRESSION: 1.  No fracture or dislocation. 2. Joint space narrowing suggests rotator cuff arthropathy. Electronically Signed   By: Suzy Bouchard M.D.   On: 01/02/2018 11:59    Medications:  Prior to Admission:  Medications Prior to Admission  Medication Sig Dispense Refill Last Dose  . acetaminophen (TYLENOL) 500 MG tablet Take 500 mg by mouth every 6 (six) hours as needed (pain).    Past Week at Unknown time   . Calcium 600-200 MG-UNIT per tablet Take 1 tablet by mouth daily.     12/29/2017 at 0800  . docusate sodium (COLACE) 100 MG capsule Take 100 mg by mouth daily.   12/29/2017 at 0800  . dorzolamide-timolol (COSOPT) 22.3-6.8 MG/ML ophthalmic solution Place 1 drop into both eyes 2 (two) times daily.     12/29/2017 at 2000  . furosemide (LASIX) 20 MG tablet Take 20 mg by mouth daily. Take 1 tablet by mouth once a day for 7 days   12/29/2017 at 0800  . gabapentin (NEURONTIN) 100 MG capsule Take 2 capsules (200 mg total) by mouth 3 (three) times daily.   12/29/2017 at 1800  . losartan (COZAAR) 50 MG tablet Take 50 mg by mouth daily.    12/29/2017 at 0800  . LUMIGAN 0.01 % SOLN Place 1 drop into both eyes at bedtime.    12/29/2017 at 2200  . Melatonin 5 MG TABS Take by mouth at bedtime.   12/29/2017 at 2200  . Multiple Vitamins-Minerals (PRESERVISION AREDS 2 PO) Take 2 tablets by mouth every morning. One tablet daily    12/29/2017 at 0800  . NIFEdipine (PROCARDIA-XL/ADALAT-CC/NIFEDICAL-XL) 30 MG 24 hr tablet Take 30 mg by mouth daily.  11 12/29/2017 at 0800  . oxybutynin (DITROPAN-XL) 10 MG 24 hr tablet Take 10 mg by mouth daily.    12/29/2017 at 0800  . Oxycodone HCl 10 MG TABS Take 1 mg  by mouth 2 (two) times daily.   12/29/2017 at 2000  . pantoprazole (PROTONIX) 40 MG tablet Take 40 mg by mouth daily.  12 12/29/2017 at 0800  . polyethylene glycol (MIRALAX / GLYCOLAX) packet Take 17 g by mouth daily.   12/29/2017 at 0800  . potassium chloride SA (K-DUR,KLOR-CON) 20 MEQ tablet Take 20 mEq by mouth daily. Take 1 tablet by mouth once a day for 10 days   12/29/2017 at 0800  . promethazine (PHENERGAN) 12.5 MG tablet Take 1 tablet (12.5 mg total) by mouth every 6 (six) hours as needed for nausea or vomiting. 30 tablet 0 12/11/2017 at Unknown time  . traMADol (ULTRAM) 50 MG tablet Take 1 tablet (50 mg total) by mouth every 6 (six) hours as needed. (Patient taking differently: Take 25-50 mg by mouth every 6 (six) hours as  needed. ) 60 tablet 5 12/11/2017 at Unknown time  . predniSONE (DELTASONE) 10 MG tablet Take 10 mg by mouth daily as needed.   Completed Course at Unknown time   Scheduled: . calcium-vitamin D  1 tablet Oral Daily  . docusate sodium  100 mg Oral Daily  . dorzolamide  1 drop Both Eyes BID  . feeding supplement (ENSURE ENLIVE)  237 mL Oral BID BM  . gabapentin  200 mg Oral TID  . heparin injection (subcutaneous)  5,000 Units Subcutaneous Q8H  . latanoprost  1 drop Both Eyes QHS  . multivitamin-lutein  1 capsule Oral q morning - 10a  . mupirocin ointment  1 application Nasal BID  . NIFEdipine  30 mg Oral Daily  . pantoprazole  40 mg Oral Daily  . polyethylene glycol  17 g Oral Daily  . timolol  1 drop Both Eyes BID   Continuous: . ceFEPime (MAXIPIME) IV Stopped (01/03/18 1019)  . vancomycin Stopped (01/03/18 1206)   IFO:YDXAJOINOMVEH, ipratropium-albuterol, traMADol  Assesment: She was admitted with sepsis from cellulitis.  This is getting better.  She did not have healthcare associated pneumonia.  At baseline she has dementia and she is having more confusion from being out of her normal environment.  She is very weak and it has been recommended that she go to a skilled care facility for rehab. Principal Problem:   Sepsis ([place]) Active Problems:   HTN (hypertension)   CKD (chronic kidney disease) stage 4, GFR 15-29 ml/min (HCC)   Pressure injury of skin   Cellulitis of left leg   HCAP (healthcare-associated pneumonia)   Glaucoma (increased eye pressure)    Plan: Continue IV antibiotics for now.  She may be able to switch to oral tomorrow.    LOS: 5 days   Silas Muff L 01/04/2018, 9:35 AM

## 2018-01-05 MED ORDER — DONEPEZIL HCL 5 MG PO TABS
5.0000 mg | ORAL_TABLET | Freq: Every day | ORAL | Status: DC
  Administered 2018-01-05: 5 mg via ORAL
  Filled 2018-01-05: qty 1

## 2018-01-05 MED ORDER — GABAPENTIN 100 MG PO CAPS
100.0000 mg | ORAL_CAPSULE | Freq: Three times a day (TID) | ORAL | Status: DC
  Administered 2018-01-05 – 2018-01-06 (×3): 100 mg via ORAL
  Filled 2018-01-05 (×3): qty 1

## 2018-01-06 DIAGNOSIS — R4182 Altered mental status, unspecified: Secondary | ICD-10-CM | POA: Diagnosis not present

## 2018-01-06 DIAGNOSIS — Z8619 Personal history of other infectious and parasitic diseases: Secondary | ICD-10-CM | POA: Diagnosis not present

## 2018-01-06 DIAGNOSIS — A4189 Other specified sepsis: Secondary | ICD-10-CM | POA: Diagnosis not present

## 2018-01-06 DIAGNOSIS — A419 Sepsis, unspecified organism: Secondary | ICD-10-CM | POA: Diagnosis not present

## 2018-01-06 DIAGNOSIS — R627 Adult failure to thrive: Secondary | ICD-10-CM | POA: Diagnosis not present

## 2018-01-06 DIAGNOSIS — M545 Low back pain: Secondary | ICD-10-CM | POA: Diagnosis not present

## 2018-01-06 DIAGNOSIS — R2681 Unsteadiness on feet: Secondary | ICD-10-CM | POA: Diagnosis not present

## 2018-01-06 DIAGNOSIS — I10 Essential (primary) hypertension: Secondary | ICD-10-CM | POA: Diagnosis not present

## 2018-01-06 DIAGNOSIS — J189 Pneumonia, unspecified organism: Secondary | ICD-10-CM | POA: Diagnosis not present

## 2018-01-06 DIAGNOSIS — I1310 Hypertensive heart and chronic kidney disease without heart failure, with stage 1 through stage 4 chronic kidney disease, or unspecified chronic kidney disease: Secondary | ICD-10-CM | POA: Diagnosis not present

## 2018-01-06 DIAGNOSIS — I130 Hypertensive heart and chronic kidney disease with heart failure and stage 1 through stage 4 chronic kidney disease, or unspecified chronic kidney disease: Secondary | ICD-10-CM | POA: Diagnosis not present

## 2018-01-06 DIAGNOSIS — F0280 Dementia in other diseases classified elsewhere without behavioral disturbance: Secondary | ICD-10-CM | POA: Diagnosis not present

## 2018-01-06 DIAGNOSIS — F0391 Unspecified dementia with behavioral disturbance: Secondary | ICD-10-CM | POA: Diagnosis not present

## 2018-01-06 DIAGNOSIS — M810 Age-related osteoporosis without current pathological fracture: Secondary | ICD-10-CM | POA: Diagnosis not present

## 2018-01-06 DIAGNOSIS — Z7401 Bed confinement status: Secondary | ICD-10-CM | POA: Diagnosis not present

## 2018-01-06 DIAGNOSIS — M6281 Muscle weakness (generalized): Secondary | ICD-10-CM | POA: Diagnosis not present

## 2018-01-06 DIAGNOSIS — G629 Polyneuropathy, unspecified: Secondary | ICD-10-CM | POA: Diagnosis not present

## 2018-01-06 DIAGNOSIS — R41841 Cognitive communication deficit: Secondary | ICD-10-CM | POA: Diagnosis not present

## 2018-01-06 DIAGNOSIS — R531 Weakness: Secondary | ICD-10-CM | POA: Diagnosis not present

## 2018-01-06 DIAGNOSIS — M159 Polyosteoarthritis, unspecified: Secondary | ICD-10-CM | POA: Diagnosis not present

## 2018-01-06 DIAGNOSIS — R262 Difficulty in walking, not elsewhere classified: Secondary | ICD-10-CM | POA: Diagnosis not present

## 2018-01-06 DIAGNOSIS — L03116 Cellulitis of left lower limb: Secondary | ICD-10-CM | POA: Diagnosis not present

## 2018-01-06 DIAGNOSIS — R1312 Dysphagia, oropharyngeal phase: Secondary | ICD-10-CM | POA: Diagnosis not present

## 2018-01-06 DIAGNOSIS — R1110 Vomiting, unspecified: Secondary | ICD-10-CM | POA: Diagnosis not present

## 2018-01-06 DIAGNOSIS — S32039D Unspecified fracture of third lumbar vertebra, subsequent encounter for fracture with routine healing: Secondary | ICD-10-CM | POA: Diagnosis not present

## 2018-01-06 DIAGNOSIS — K219 Gastro-esophageal reflux disease without esophagitis: Secondary | ICD-10-CM | POA: Diagnosis not present

## 2018-01-06 DIAGNOSIS — H44513 Absolute glaucoma, bilateral: Secondary | ICD-10-CM | POA: Diagnosis not present

## 2018-01-06 DIAGNOSIS — Z9181 History of falling: Secondary | ICD-10-CM | POA: Diagnosis not present

## 2018-01-06 DIAGNOSIS — G3184 Mild cognitive impairment, so stated: Secondary | ICD-10-CM | POA: Diagnosis not present

## 2018-01-06 LAB — CREATININE, SERUM
CREATININE: 1.45 mg/dL — AB (ref 0.44–1.00)
GFR calc Af Amer: 35 mL/min — ABNORMAL LOW (ref >60)
GFR calc non Af Amer: 30 mL/min — ABNORMAL LOW (ref >60)

## 2018-01-06 MED ORDER — DOXYCYCLINE HYCLATE 100 MG PO CAPS: 100.0000 mg | ORAL_CAPSULE | Freq: Two times a day (BID) | ORAL | Status: AC

## 2018-01-06 MED ORDER — IPRATROPIUM-ALBUTEROL 0.5-2.5 (3) MG/3ML IN SOLN: 3.0000 mL | Freq: Four times a day (QID) | RESPIRATORY_TRACT | Status: DC | PRN

## 2018-01-06 MED ORDER — DONEPEZIL HCL 5 MG PO TABS
5.0000 mg | ORAL_TABLET | Freq: Every day | ORAL | Status: AC
Start: 2018-01-06 — End: ?

## 2018-01-06 NOTE — Discharge Summary (Signed)
Physician Discharge Summary  Patient ID: Cindy Schultz MRN: [id number] DOB/AGE: Jun 05, 1924 84 y.o. Primary Care Physician:[place] Lebeau, Percell Miller, MD Admit date: 12/30/2017 Discharge date: 01/06/2018    Discharge Diagnoses:   Principal Problem:   Sepsis [facility]) Active Problems:   HTN (hypertension)   CKD (chronic kidney disease) stage 4, GFR 15-29 ml/min (HCC)   Pressure injury of skin   Cellulitis of left leg   HCAP (healthcare-associated pneumonia)   Glaucoma (increased eye pressure) Dementia with behavioral disturbance Osteoporosis Failure to thrive Allergies as of 01/06/2018      Reactions   Codeine Nausea And Vomiting   Aspirin    Cannot take due to Diverticulosis (bleeding side effects)   Pepto-bismol [bismuth Subsalicylate]    Black stools   Hydrocodone Other (See Comments)   Unknown Reaction       Medication List    STOP taking these medications   gabapentin 100 MG capsule Commonly known as:  NEURONTIN   Oxycodone HCl 10 MG Tabs   predniSONE 10 MG tablet Commonly known as:  DELTASONE     TAKE these medications   acetaminophen 500 MG tablet Commonly known as:  TYLENOL Take 500 mg by mouth every 6 (six) hours as needed (pain).   Calcium 600-200 MG-UNIT tablet Take 1 tablet by mouth daily.   docusate sodium 100 MG capsule Commonly known as:  COLACE Take 100 mg by mouth daily.   donepezil 5 MG tablet Commonly known as:  ARICEPT Take 1 tablet (5 mg total) by mouth at bedtime.   dorzolamide-timolol 22.3-6.8 MG/ML ophthalmic solution Commonly known as:  COSOPT Place 1 drop into both eyes 2 (two) times daily.   doxycycline 100 MG capsule Commonly known as:  VIBRAMYCIN Take 1 capsule (100 mg total) by mouth 2 (two) times daily for 5 days.   furosemide 20 MG tablet Commonly known as:  LASIX Take 20 mg by mouth daily. Take 1 tablet by mouth once a day for 7 days   ipratropium-albuterol 0.5-2.5 (3) MG/3ML Soln Commonly known as:  DUONEB Take 3 mLs by  nebulization every 6 (six) hours as needed.   losartan 50 MG tablet Commonly known as:  COZAAR Take 50 mg by mouth daily.   LUMIGAN 0.01 % Soln Generic drug:  bimatoprost Place 1 drop into both eyes at bedtime.   Melatonin 5 MG Tabs Take by mouth at bedtime.   NIFEdipine 30 MG 24 hr tablet Commonly known as:  PROCARDIA-XL/ADALAT-CC/NIFEDICAL-XL Take 30 mg by mouth daily.   oxybutynin 10 MG 24 hr tablet Commonly known as:  DITROPAN-XL Take 10 mg by mouth daily.   pantoprazole 40 MG tablet Commonly known as:  PROTONIX Take 40 mg by mouth daily.   polyethylene glycol packet Commonly known as:  MIRALAX / GLYCOLAX Take 17 g by mouth daily.   potassium chloride SA 20 MEQ tablet Commonly known as:  K-DUR,KLOR-CON Take 20 mEq by mouth daily. Take 1 tablet by mouth once a day for 10 days   PRESERVISION AREDS 2 PO Take 2 tablets by mouth every morning. One tablet daily   promethazine 12.5 MG tablet Commonly known as:  PHENERGAN Take 1 tablet (12.5 mg total) by mouth every 6 (six) hours as needed for nausea or vomiting.   traMADol 50 MG tablet Commonly known as:  ULTRAM Take 1 tablet (50 mg total) by mouth every 6 (six) hours as needed. What changed:  how much to take       Discharged Condition: Improved  Consults: Wound care  Significant Diagnostic Studies: Dg Lumbar Spine Complete  Result Date: 12/12/2017 CLINICAL DATA:  Low back pain.  Unwitnessed fall. EXAM: LUMBAR SPINE - COMPLETE 4+ VIEW COMPARISON:  08/28/2017 CT abdomen/pelvis. FINDINGS: This report assumes 5 non rib-bearing lumbar vertebrae. Lumbar vertebral body heights are preserved, with no fracture. Moderate multilevel lumbar degenerative disc disease. No spondylolisthesis. Moderate bilateral lower lumbar facet arthropathy. No aggressive appearing focal osseous lesions. Abdominal aortic atherosclerosis. IMPRESSION: 1. No lumbar spine fracture or spondylolisthesis. 2. Moderate multilevel lumbar degenerative  changes as detailed. Electronically Signed   By: Ilona Sorrel M.D.   On: 12/12/2017 20:33   Ct Chest Wo Contrast  Result Date: 12/31/2017 CLINICAL DATA:  Shortness of breath, fever, hypoxia, and tachycardia. EXAM: CT CHEST WITHOUT CONTRAST TECHNIQUE: Multidetector CT imaging of the chest was performed following the standard protocol without IV contrast. COMPARISON:  Chest x-rays dated 12/30/2017, 08/31/2015, and 03/25/2015 FINDINGS: Cardiovascular: Heart size is normal. Aortic atherosclerosis. Extensive coronary artery calcifications. Tortuous proximal brachiocephalic vessels are responsible for the slight widening of the superior mediastinum. Mediastinum/Nodes: No enlarged mediastinal or axillary lymph nodes. Thyroid gland, trachea, and esophagus demonstrate no significant findings. Lungs/Pleura: There are small bilateral pleural effusions with slight atelectasis at the lung bases. Upper Abdomen: No acute abnormality. Musculoskeletal: No chest wall mass or suspicious bone lesions identified. IMPRESSION: 1. Small bilateral pleural effusions with adjacent bibasilar atelectasis. 2. Slight accentuation of the interstitial markings without discrete interstitial edema, nonspecific. 3.  Aortic Atherosclerosis (ICD10-I70.0). Electronically Signed   By: Lorriane Shire M.D.   On: 12/31/2017 08:29   Mr Lumbar Spine Wo Contrast  Result Date: 12/13/2017 CLINICAL DATA:  [place] yesterday.  Back pain.  Negative radiography. EXAM: MRI LUMBAR SPINE WITHOUT CONTRAST TECHNIQUE: Multiplanar, multisequence MR imaging of the lumbar spine was performed. No intravenous contrast was administered. COMPARISON:  Radiography 12/12/2017 FINDINGS: Segmentation:  5 lumbar type vertebral bodies. Alignment:  Mild curvature convex to the left with the apex at L3. Vertebrae: Acute superior endplate fracture at L3 with less than 10% loss of height anteriorly. Mild bone marrow edema. No retropulsed bone. No traumatic disc herniation. No sign of  pre-existing pathologic bone lesion. No other regional fracture or bone lesion. Conus medullaris and cauda equina: Conus extends to the L1 level. Conus and cauda equina appear normal. Paraspinal and other soft tissues: Negative Disc levels: Non-compressive disc bulges at L2-3 and above. L3-4: Moderate disc bulge. Mild facet and ligamentous hypertrophy. Mild narrowing of the right lateral recess and intervertebral foramen on the right but without distinct neural compression. L4-5: Moderate bulging of the disc. Mild facet and ligamentous hypertrophy. Mild narrowing of both lateral recesses but without compressive stenosis. L5-S1: Mild disc bulge. Facet degeneration. Mild left foraminal narrowing without likely neural compression. IMPRESSION: Acute superior endplate fracture at L3 with loss of height of approximately 10% anteriorly. No retropulsed bone. Regional edema. This looks like a benign fracture. Ordinary degenerative changes throughout the lumbar region as outlined above without likely neural compression. Electronically Signed   By: Nelson Chimes M.D.   On: 12/13/2017 08:19   Dg Chest Port 1 View  Result Date: 12/30/2017 CLINICAL DATA:  Altered mental status. EXAM: PORTABLE CHEST 1 VIEW COMPARISON:  PA and lateral chest 08/31/2015 and 05/25/2015. FINDINGS: Lung volumes are lower than on the comparison examinations with associated basilar atelectasis. Heart size is normal. No pneumothorax or pleural fluid. Aortic atherosclerosis is noted. No acute or focal bony abnormality. IMPRESSION: Bibasilar subsegmental atelectasis in a low  volume chest. No acute disease. Electronically Signed   By: Inge Rise M.D.   On: 12/30/2017 10:21   Dg Shoulder Right Port  Result Date: 01/02/2018 CLINICAL DATA:  RIGHT SHOULDER PAIN, PT WOULD NOT GIVE ANY HISTORY EXAM: PORTABLE RIGHT SHOULDER COMPARISON:  Chest radiograph 12/30/2017, CT 12/31/2017 FINDINGS: Glenohumeral joint is intact. No evidence of scapular fracture or  humeral fracture. The acromioclavicular joint is intact. There is narrowing of the acromial humeral interval superiorly. IMPRESSION: 1.  No fracture or dislocation. 2. Joint space narrowing suggests rotator cuff arthropathy. Electronically Signed   By: Suzy Bouchard M.D.   On: 01/02/2018 11:59   Ir Kypho Lumbar Inc Fx Reduce Bone Bx Uni/bil Cannulation Inc/imaging  Result Date: 12/18/2017 INDICATION: Severe low back pain secondary to compression fracture at L3. EXAM: KYPHOPLASTY AT L3 COMPARISON:  MRI lumbosacral spine of 12/13/2017. MEDICATIONS: As antibiotic prophylaxis, 1 g Ancef was ordered pre-procedure and administered intravenously within 1 hour of incision. ANESTHESIA/SEDATION: Moderate (conscious) sedation was employed during this procedure. A total of Versed 2 mg and Fentanyl 50 mcg was administered intravenously. Moderate Sedation Time: 25 minutes. The patient's level of consciousness and vital signs were monitored continuously by radiology nursing throughout the procedure under my direct supervision. FLUOROSCOPY TIME:  Fluoroscopy Time: 9 minutes 12 seconds (147 mGy) COMPLICATIONS: None immediate. PROCEDURE: Following a full explanation of the procedure along with the potential associated complications, an informed witnessed consent was obtained. The patient was placed prone on the fluoroscopic table. The skin overlying the lumbar region was then prepped and draped in the usual sterile fashion. The right pedicle at L3 was then infiltrated with 0.25% bupivacaine followed by the advancement of an 11-gauge Jamshidi needle through the right pedicle into the posterior one-third at L3. This was then exchanged for a Kyphon advanced osteo introducer system comprised of a working cannula and a Kyphon osteo drill. This combination was then advanced over a Kyphon osteo bone pin until the tip of the Kyphon osteo drill was in the posterior third at L3. At this time, the bone pin was removed. In a medial  trajectory, the combination was advanced until the tip of the working cannula was inside the posterior one-third at L3. The osteo drill was removed and a core sample sent for pathologic analysis. Through the working cannula, a Kyphon bone biopsy device was advanced to within 5 mm of the anterior aspect of L3. A core sample from this was also sent for pathologic analysis. Through the working cannula, a Kyphon inflatable bone tamp 20 x 3 was advanced and positioned with the distal marker 5 mm from the anterior aspect of L3. Crossing of the midline was seen on the AP projection. At this time, the balloon was expanded using contrast via a Kyphon inflation syringe device via microtubing. Inflations were continued until there was apposition with the superior endplate. At this time, methylmethacrylate mixture was reconstituted with Tobramycin in the Kyphon bone mixing device system. This was then loaded onto the Kyphon bone fillers. The balloon was deflated and removed followed by the instillation of 5 bone filler equivalents of methylmethacrylate mixture at L3 with excellent filling in the AP and lateral projections. No extravasation was noted in the disk spaces or posteriorly into the spinal canal. No epidural venous contamination was seen. The working cannula and the bone filler were then retrieved and removed. Hemostasis was achieved at the skin entry site. The patient was then transferred back to Between: 1. Status post  fluoroscopic-guided needle placement for deep core bone biopsy at L3. The report of the biopsy is to be sent to patient's referring doctors, Dana Allan, MD and Arther Abbott, MD. 2. Status post vertebral body augmentation using balloon kyphoplasty at L3 as described without event. Electronically Signed   By: Luanne Bras M.D.   On: 12/17/2017 12:32   Dg Hip Unilat W Or Wo Pelvis 2-3 Views Right  Result Date: 12/12/2017 CLINICAL DATA:  Right hip pain after  unwitnessed fall at home. EXAM: DG HIP (WITH OR WITHOUT PELVIS) 2-3V RIGHT COMPARISON:  05/25/2015 pelvis and right hip radiographs. FINDINGS: There is lower lumbar degenerative disc and facet arthropathy from L4 through S1. No pelvic diastasis. Moderate left and marked right joint space narrowing with spurring is identified about both hips consistent with osteoarthritis. No acute displaced fracture. IMPRESSION: Osteoarthritis of both hips, right worse than left similar to prior. No acute fracture is identified. Rim of osteophytes are noted about the femoral head-neck junctions bilaterally and off the acetabula. Electronically Signed   By: Ashley Royalty M.D.   On: 12/12/2017 20:39    Lab Results: Basic Metabolic Panel: Recent Labs    01/04/18 0630 01/06/18 0526  NA 139  --   K 3.9  --   CL 106  --   CO2 24  --   GLUCOSE 91  --   BUN 29*  --   CREATININE 1.43* 1.45*  CALCIUM 8.8*  --    Liver Function Tests: Recent Labs    01/04/18 0630  AST 31  ALT 25  ALKPHOS 85  BILITOT 0.7  PROT 5.5*  ALBUMIN 2.2*     CBC: Recent Labs    01/04/18 0630  WBC 8.8  NEUTROABS 5.2  HGB 11.1*  HCT 34.7*  MCV 102.4*  PLT 256    Recent Results (from the past 240 hour(s))  Urine culture     Status: None   Collection Time: 12/30/17  9:15 AM  Result Value Ref Range Status   Specimen Description   Final    URINE, CATHETERIZED Performed at [facility], [address], [place], [place] [address]    Special Requests   Final    keflex Performed at [facility], [address] [place], [place] [address]    Culture   Final    NO GROWTH Performed at [facility], [place] [address], [place], [place] [address]    Report Status 12/31/2017 FINAL  Final  Culture, blood (Routine x 2)     Status: None   Collection Time: 12/30/17  9:42 AM  Result Value Ref Range Status   Specimen Description BLOOD RIGHT FOREARM DRAWN BY RN  Final   Special Requests   Final    BOTTLES DRAWN AEROBIC ONLY Blood  Culture results may not be optimal due to an inadequate volume of blood received in culture bottles   Culture   Final    NO GROWTH 5 DAYS Performed at [facility], [address], [place], [place] [address]    Report Status 01/04/2018 FINAL  Final  Culture, blood (Routine x 2)     Status: None   Collection Time: 12/30/17  9:42 AM  Result Value Ref Range Status   Specimen Description BLOOD LEFT ARM DRAWN BY RN  Final   Special Requests   Final    BOTTLES DRAWN AEROBIC AND ANAEROBIC Blood Culture adequate volume   Culture   Final    NO GROWTH 5 DAYS Performed  at [facility], [address] [place], [place] [address]    Report Status 01/04/2018 FINAL  Final  MRSA PCR Screening     Status: Abnormal   Collection Time: 12/30/17  1:17 PM  Result Value Ref Range Status   MRSA by PCR POSITIVE (A) NEGATIVE Final    Comment:        The GeneXpert MRSA Assay (FDA approved for NASAL specimens only), is one component of a comprehensive MRSA colonization surveillance program. It is not intended to diagnose MRSA infection nor to guide or monitor treatment for MRSA infections. RESULT CALLED TO, READ BACK BY AND VERIFIED WITH: TAYLOR,RN AT 1813 ON 7.15.2019 BY ISLEY,B Performed at [facility], [address] [place], [place] [address]   Culture, blood (routine x 2) Call MD if unable to obtain prior to antibiotics being given     Status: None   Collection Time: 12/30/17  5:42 PM  Result Value Ref Range Status   Specimen Description BLOOD LEFT HAND  Final   Special Requests   Final    BOTTLES DRAWN AEROBIC ONLY Blood Culture adequate volume   Culture   Final    NO GROWTH 5 DAYS Performed at [facility], [address], [place], [place] [address]    Report Status 01/04/2018 FINAL  Final  Culture, blood (routine x 2) Call MD if unable to obtain prior to antibiotics being given     Status: None   Collection Time: 12/30/17 11:29 PM  Result Value Ref Range Status   Specimen Description BLOOD  RIGHT ARM  Final   Special Requests   Final    BOTTLES DRAWN AEROBIC AND ANAEROBIC Blood Culture adequate volume   Culture   Final    NO GROWTH 5 DAYS Performed at [facility], [address] [place], [place] [address]    Report Status 01/04/2018 FINAL  Final     Hospital Course: This is a 84 year old who came from a skilled care facility where she was doing rehab after having kyphoplasty on her back.  She became confused and she was found to be septic when she arrived.  There was a question of as to whether she had pneumonia but that is not the case.  She has cellulitis of her left leg with wounds on her legs from previous falls.  She had wound care consultation and it was felt that on both leg wounds Xeroform gauze should be applied and also Xeroform gauze applied to the right elbow.  She should have a light compression Ace wrap to the right leg.  All of this can be discontinued when the wounds are healed.  She has a wound on her heel on the left and that is going to have foam dressing and heel protectors in the heel should be kept off the mattress as much as possible.  Her cellulitis improved.  She had more trouble with confusion.  By the time of discharge she was at maximum hospital benefit and had received 7 days of IV antibiotics.  Discharge Exam: Blood pressure (!) 144/89, pulse (!) 41, temperature 98.9 F (37.2 C), temperature source Oral, resp. rate 20, height 5\' 4"  (1.626 m), weight 61.4 kg (135 lb 5.8 oz), SpO2 90 %. She is awake and alert but confused.  Chest is clear.  Heart is regular.  Her legs look better.  Disposition: Back to skilled care facility.  She will need doxycycline 100 mg twice daily x5 days.  Wound care as listed above.  She will have PT, OT and speech as needed.  She has DNR status.  She has failure to thrive and this has been discussed with her daughter    Contact information for after-discharge care    Destination    [facility] .   Service:   Skilled Nursing Contact information: [address] [place] [phone]              Signed: Alonza Bogus   01/06/2018, 8:51 AM

## 2018-01-06 NOTE — Care Management Important Message (Signed)
Important Message  Patient Details  Name: TINNA KOLKER MRN: [id number] Date of Birth: 1923-12-02   Medicare Important Message Given:  Yes    [name] 01/06/2018, 11:44 AM

## 2018-01-06 NOTE — Progress Notes (Signed)
Called in report to [facility] [place], [place] at 1220. Gave report to nurse Denton Ar. IV removed, patient tolerated procedure well. Script for medication included in patient's discharge packet. EMS has been called and is in route to pick up patient for transport. Will continue to monitor.

## 2018-01-06 NOTE — Progress Notes (Signed)
Subjective: She is sleepy but arousable.  She has no new complaints.  She is confused.  Objective: Vital signs in last 24 hours: Temp:  [98.9 F (37.2 C)-100.2 F (37.9 C)] 98.9 F (37.2 C) (07/22 0508) Pulse Rate:  [41-116] 41 (07/22 0508) Resp:  [20] 20 (07/22 0508) BP: (128-144)/(56-89) 144/89 (07/22 0508) SpO2:  [90 %-95 %] 90 % (07/22 0508) Weight:  [61.4 kg (135 lb 5.8 oz)] 61.4 kg (135 lb 5.8 oz) (07/22 0508) Weight change: -0.9 kg (-1 lb 15.8 oz) Last BM Date: 01/03/18  Intake/Output from previous day: 07/21 0701 - 07/22 0700 In: 480 [P.O.:480] Out: 4 [Urine:2; Stool:2]  PHYSICAL EXAM General appearance: Sleepy but arousable.  Confused Resp: clear to auscultation bilaterally Cardio: regular rate and rhythm, S1, S2 normal, no murmur, click, rub or gallop GI: soft, non-tender; bowel sounds normal; no masses,  no organomegaly Extremities: extremities normal, atraumatic, no cyanosis or edema  Lab Results:  Results for orders placed or performed during the hospital encounter of 12/30/17 (from the past 48 hour(s))  Creatinine, serum     Status: Abnormal   Collection Time: 01/06/18  5:26 AM  Result Value Ref Range   Creatinine, Ser 1.45 (H) 0.44 - 1.00 mg/dL   GFR calc non Af Amer 30 (L) >60 mL/min   GFR calc Af Amer 35 (L) >60 mL/min    Comment: (NOTE) The eGFR has been calculated using the CKD EPI equation. This calculation has not been validated in all clinical situations. eGFR's persistently <60 mL/min signify possible Chronic Kidney Disease. Performed at [facility], [address] [place], [place] [address]     ABGS No results for input(s): PHART, PO2ART, TCO2, HCO3 in the last 72 hours.  Invalid input(s): PCO2 CULTURES Recent Results (from the past 240 hour(s))  Urine culture     Status: None   Collection Time: 12/30/17  9:15 AM  Result Value Ref Range Status   Specimen Description   Final    URINE, CATHETERIZED Performed at [facility], [address], [place], [place] [address]    Special Requests   Final    keflex Performed at [facility], [address] [place], [place] [address]    Culture   Final    NO GROWTH Performed at [facility], [place] [address], [place], [place] [address]    Report Status 12/31/2017 FINAL  Final  Culture, blood (Routine x 2)     Status: None   Collection Time: 12/30/17  9:42 AM  Result Value Ref Range Status   Specimen Description BLOOD RIGHT FOREARM DRAWN BY RN  Final   Special Requests   Final    BOTTLES DRAWN AEROBIC ONLY Blood Culture results may not be optimal due to an inadequate volume of blood received in culture bottles   Culture   Final    NO GROWTH 5 DAYS Performed at [facility], [address], [place], [place] [address]    Report Status 01/04/2018 FINAL  Final  Culture, blood (Routine x 2)     Status: None   Collection Time: 12/30/17  9:42 AM  Result Value Ref Range Status   Specimen Description BLOOD LEFT ARM DRAWN BY RN  Final   Special Requests   Final    BOTTLES DRAWN AEROBIC AND ANAEROBIC Blood Culture adequate volume   Culture   Final    NO GROWTH 5 DAYS Performed at [facility], [address] [place], [place] [address]    Report  Status 01/04/2018 FINAL  Final  MRSA PCR Screening     Status: Abnormal   Collection Time: 12/30/17  1:17 PM  Result Value Ref Range Status   MRSA by PCR POSITIVE (A) NEGATIVE Final    Comment:        The GeneXpert MRSA Assay (FDA approved for NASAL specimens only), is one component of a comprehensive MRSA colonization surveillance program. It is not intended to diagnose MRSA infection nor to guide or monitor treatment for MRSA infections. RESULT CALLED TO, READ BACK BY AND VERIFIED WITH: TAYLOR,RN AT 1813 ON 7.15.2019 BY ISLEY,B Performed at [facility], [address], [place], [place] [address]   Culture, blood (routine x 2) Call MD if unable to obtain prior to antibiotics being given     Status: None   Collection Time:  12/30/17  5:42 PM  Result Value Ref Range Status   Specimen Description BLOOD LEFT HAND  Final   Special Requests   Final    BOTTLES DRAWN AEROBIC ONLY Blood Culture adequate volume   Culture   Final    NO GROWTH 5 DAYS Performed at [facility], [address] [place], [place] [address]    Report Status 01/04/2018 FINAL  Final  Culture, blood (routine x 2) Call MD if unable to obtain prior to antibiotics being given     Status: None   Collection Time: 12/30/17 11:29 PM  Result Value Ref Range Status   Specimen Description BLOOD RIGHT ARM  Final   Special Requests   Final    BOTTLES DRAWN AEROBIC AND ANAEROBIC Blood Culture adequate volume   Culture   Final    NO GROWTH 5 DAYS Performed at [facility], [address], [place], [place] [address]    Report Status 01/04/2018 FINAL  Final   Studies/Results: No results found.  Medications:  Prior to Admission:  Medications Prior to Admission  Medication Sig Dispense Refill Last Dose  . acetaminophen (TYLENOL) 500 MG tablet Take 500 mg by mouth every 6 (six) hours as needed (pain).    Past Week at Unknown time  . Calcium 600-200 MG-UNIT per tablet Take 1 tablet by mouth daily.     12/29/2017 at 0800  . docusate sodium (COLACE) 100 MG capsule Take 100 mg by mouth daily.   12/29/2017 at 0800  . dorzolamide-timolol (COSOPT) 22.3-6.8 MG/ML ophthalmic solution Place 1 drop into both eyes 2 (two) times daily.     12/29/2017 at 2000  . furosemide (LASIX) 20 MG tablet Take 20 mg by mouth daily. Take 1 tablet by mouth once a day for 7 days   12/29/2017 at 0800  . gabapentin (NEURONTIN) 100 MG capsule Take 2 capsules (200 mg total) by mouth 3 (three) times daily.   12/29/2017 at 1800  . losartan (COZAAR) 50 MG tablet Take 50 mg by mouth daily.    12/29/2017 at 0800  . LUMIGAN 0.01 % SOLN Place 1 drop into both eyes at bedtime.    12/29/2017 at 2200  . Melatonin 5 MG TABS Take by mouth at bedtime.   12/29/2017 at 2200  . Multiple Vitamins-Minerals  (PRESERVISION AREDS 2 PO) Take 2 tablets by mouth every morning. One tablet daily    12/29/2017 at 0800  . NIFEdipine (PROCARDIA-XL/ADALAT-CC/NIFEDICAL-XL) 30 MG 24 hr tablet Take 30 mg by mouth daily.  11 12/29/2017 at 0800  . oxybutynin (DITROPAN-XL) 10 MG 24 hr tablet Take 10 mg by mouth daily.    12/29/2017 at 0800  .  Oxycodone HCl 10 MG TABS Take 1 mg by mouth 2 (two) times daily.   12/29/2017 at 2000  . pantoprazole (PROTONIX) 40 MG tablet Take 40 mg by mouth daily.  12 12/29/2017 at 0800  . polyethylene glycol (MIRALAX / GLYCOLAX) packet Take 17 g by mouth daily.   12/29/2017 at 0800  . potassium chloride SA (K-DUR,KLOR-CON) 20 MEQ tablet Take 20 mEq by mouth daily. Take 1 tablet by mouth once a day for 10 days   12/29/2017 at 0800  . promethazine (PHENERGAN) 12.5 MG tablet Take 1 tablet (12.5 mg total) by mouth every 6 (six) hours as needed for nausea or vomiting. 30 tablet 0 12/11/2017 at Unknown time  . traMADol (ULTRAM) 50 MG tablet Take 1 tablet (50 mg total) by mouth every 6 (six) hours as needed. (Patient taking differently: Take 25-50 mg by mouth every 6 (six) hours as needed. ) 60 tablet 5 12/11/2017 at Unknown time  . predniSONE (DELTASONE) 10 MG tablet Take 10 mg by mouth daily as needed.   Completed Course at Unknown time   Scheduled: . calcium-vitamin D  1 tablet Oral Daily  . docusate sodium  100 mg Oral Daily  . donepezil  5 mg Oral QHS  . dorzolamide  1 drop Both Eyes BID  . feeding supplement (ENSURE ENLIVE)  237 mL Oral BID BM  . gabapentin  100 mg Oral TID  . heparin injection (subcutaneous)  5,000 Units Subcutaneous Q8H  . latanoprost  1 drop Both Eyes QHS  . multivitamin-lutein  1 capsule Oral q morning - 10a  . NIFEdipine  30 mg Oral Daily  . pantoprazole  40 mg Oral Daily  . polyethylene glycol  17 g Oral Daily  . timolol  1 drop Both Eyes BID   Continuous: . ceFEPime (MAXIPIME) IV Stopped (01/05/18 1042)  . vancomycin Stopped (01/05/18 1207)   GLO:VFIEPPIRJJOAC,  ipratropium-albuterol, traMADol  Assesment: She was admitted with sepsis from cellulitis of her leg.  She is much improved.  She has dementia and has been started on Aricept  She has osteoporosis and had a recent compression fracture in her spine and had kyphoplasty for that  She had to skin injuries to her legs and had cellulitis of the left leg all of this is improving  She has hypertension which is well controlled  She has failure to thrive Principal Problem:   Sepsis ([place]) Active Problems:   HTN (hypertension)   CKD (chronic kidney disease) stage 4, GFR 15-29 ml/min (HCC)   Pressure injury of skin   Cellulitis of left leg   HCAP (healthcare-associated pneumonia)   Glaucoma (increased eye pressure)    Plan: Transfer to the skilled care facility today.  Had long discussion with her daughter yesterday.  I think she has some element of failure to thrive complicated by her dementia and other medical illnesses.  I told her I think that her mother's life expectancy is likely less than 6 months and she understands she does not want any aggressive treatments    LOS: 7 days   Jannat Rosemeyer L 01/06/2018, 8:44 AM

## 2018-01-06 NOTE — Clinical Social Work Placement (Signed)
   CLINICAL SOCIAL WORK PLACEMENT  NOTE  Date:  01/06/2018  Patient Details  Name: Cindy Schultz MRN: [id number] Date of Birth: [birth year]  Clinical Social Work is seeking post-discharge placement for this patient at the [employer] level of care (*CSW will initial, date and re-position this form in  chart as items are completed):  Yes   Patient/family provided with [facility] Work Department's list of facilities offering this level of care within the geographic area requested by the patient (or if unable, by the patient's family).  Yes   Patient/family informed of their freedom to choose among providers that offer the needed level of care, that participate in Medicare, Medicaid or managed care program needed by the patient, have an available bed and are willing to accept the patient.  Yes   Patient/family informed of [facility]'s ownership interest in [facility] and [facility], as well as of the fact that they are under no obligation to receive care at these facilities.  PASRR submitted to EDS on       PASRR number received on       Existing PASRR number confirmed on 01/03/18     FL2 transmitted to all facilities in geographic area requested by pt/family on 01/03/18     FL2 transmitted to all facilities within larger geographic area on       Patient informed that his/her managed care company has contracts with or will negotiate with certain facilities, including the following:        Yes   Patient/family informed of bed offers received.  Patient chooses bed at Other - please specify in the comment section below:([facility])     Physician recommends and patient chooses bed at      Patient to be transferred to Other - please specify in the comment section below:([facility]) on 01/06/18.  Patient to be transferred to facility by [facility]     Patient family notified on 01/06/18 of transfer.  Name of family member notified:  [name], Daughter      PHYSICIAN       Additional Comment: Pt stable for dc to [facility] today per MD. Updated pt's daughter who was aware and she remains in agreement with the plan. Updated pt's RN who will call report. DC clinical sent through the [facility]. EMS transport arranged for noon. No other CSW needs for dc.   _______________________________________________ Shade Flood, LCSW 01/06/2018, 11:30 AM

## 2018-01-08 DIAGNOSIS — K219 Gastro-esophageal reflux disease without esophagitis: Secondary | ICD-10-CM | POA: Diagnosis not present

## 2018-01-08 DIAGNOSIS — F0280 Dementia in other diseases classified elsewhere without behavioral disturbance: Secondary | ICD-10-CM | POA: Diagnosis not present

## 2018-01-08 DIAGNOSIS — I10 Essential (primary) hypertension: Secondary | ICD-10-CM | POA: Diagnosis not present

## 2018-01-09 DIAGNOSIS — R627 Adult failure to thrive: Secondary | ICD-10-CM | POA: Diagnosis not present

## 2018-01-09 DIAGNOSIS — I1310 Hypertensive heart and chronic kidney disease without heart failure, with stage 1 through stage 4 chronic kidney disease, or unspecified chronic kidney disease: Secondary | ICD-10-CM | POA: Diagnosis not present

## 2018-01-09 DIAGNOSIS — H44513 Absolute glaucoma, bilateral: Secondary | ICD-10-CM | POA: Diagnosis not present

## 2018-01-13 ENCOUNTER — Encounter (HOSPITAL_COMMUNITY): Payer: Medicare Other

## 2018-01-13 ENCOUNTER — Ambulatory Visit: Payer: Medicare Other | Admitting: Family

## 2018-01-14 DIAGNOSIS — I1310 Hypertensive heart and chronic kidney disease without heart failure, with stage 1 through stage 4 chronic kidney disease, or unspecified chronic kidney disease: Secondary | ICD-10-CM | POA: Diagnosis not present

## 2018-01-14 DIAGNOSIS — G629 Polyneuropathy, unspecified: Secondary | ICD-10-CM | POA: Diagnosis not present

## 2018-01-14 DIAGNOSIS — M545 Low back pain: Secondary | ICD-10-CM | POA: Diagnosis not present

## 2018-01-14 DIAGNOSIS — R4182 Altered mental status, unspecified: Secondary | ICD-10-CM | POA: Diagnosis not present

## 2018-01-15 DIAGNOSIS — I10 Essential (primary) hypertension: Secondary | ICD-10-CM | POA: Diagnosis not present

## 2018-01-15 DIAGNOSIS — R627 Adult failure to thrive: Secondary | ICD-10-CM | POA: Diagnosis not present

## 2018-01-29 DIAGNOSIS — R1110 Vomiting, unspecified: Secondary | ICD-10-CM | POA: Diagnosis not present

## 2018-01-29 DIAGNOSIS — R627 Adult failure to thrive: Secondary | ICD-10-CM | POA: Diagnosis not present

## 2018-02-18 DIAGNOSIS — F0280 Dementia in other diseases classified elsewhere without behavioral disturbance: Secondary | ICD-10-CM | POA: Diagnosis not present

## 2018-02-18 DIAGNOSIS — M545 Low back pain: Secondary | ICD-10-CM | POA: Diagnosis not present

## 2018-02-18 DIAGNOSIS — Z8619 Personal history of other infectious and parasitic diseases: Secondary | ICD-10-CM | POA: Diagnosis not present

## 2018-02-21 DIAGNOSIS — S32030D Wedge compression fracture of third lumbar vertebra, subsequent encounter for fracture with routine healing: Secondary | ICD-10-CM | POA: Diagnosis not present

## 2018-02-21 DIAGNOSIS — M1712 Unilateral primary osteoarthritis, left knee: Secondary | ICD-10-CM | POA: Diagnosis not present

## 2018-02-21 DIAGNOSIS — M810 Age-related osteoporosis without current pathological fracture: Secondary | ICD-10-CM | POA: Diagnosis not present

## 2018-02-21 DIAGNOSIS — M160 Bilateral primary osteoarthritis of hip: Secondary | ICD-10-CM | POA: Diagnosis not present

## 2018-02-21 DIAGNOSIS — W19XXXD Unspecified fall, subsequent encounter: Secondary | ICD-10-CM | POA: Diagnosis not present

## 2018-02-21 DIAGNOSIS — Z8701 Personal history of pneumonia (recurrent): Secondary | ICD-10-CM | POA: Diagnosis not present

## 2018-02-21 DIAGNOSIS — N184 Chronic kidney disease, stage 4 (severe): Secondary | ICD-10-CM | POA: Diagnosis not present

## 2018-02-21 DIAGNOSIS — Z86718 Personal history of other venous thrombosis and embolism: Secondary | ICD-10-CM | POA: Diagnosis not present

## 2018-02-21 DIAGNOSIS — M75101 Unspecified rotator cuff tear or rupture of right shoulder, not specified as traumatic: Secondary | ICD-10-CM | POA: Diagnosis not present

## 2018-02-21 DIAGNOSIS — I129 Hypertensive chronic kidney disease with stage 1 through stage 4 chronic kidney disease, or unspecified chronic kidney disease: Secondary | ICD-10-CM | POA: Diagnosis not present

## 2018-02-21 DIAGNOSIS — F0390 Unspecified dementia without behavioral disturbance: Secondary | ICD-10-CM | POA: Diagnosis not present

## 2018-02-21 DIAGNOSIS — R627 Adult failure to thrive: Secondary | ICD-10-CM | POA: Diagnosis not present

## 2018-02-21 DIAGNOSIS — Z9181 History of falling: Secondary | ICD-10-CM | POA: Diagnosis not present

## 2018-02-24 DIAGNOSIS — W19XXXD Unspecified fall, subsequent encounter: Secondary | ICD-10-CM | POA: Diagnosis not present

## 2018-02-24 DIAGNOSIS — N184 Chronic kidney disease, stage 4 (severe): Secondary | ICD-10-CM | POA: Diagnosis not present

## 2018-02-24 DIAGNOSIS — S32030D Wedge compression fracture of third lumbar vertebra, subsequent encounter for fracture with routine healing: Secondary | ICD-10-CM | POA: Diagnosis not present

## 2018-02-24 DIAGNOSIS — M75101 Unspecified rotator cuff tear or rupture of right shoulder, not specified as traumatic: Secondary | ICD-10-CM | POA: Diagnosis not present

## 2018-02-24 DIAGNOSIS — M160 Bilateral primary osteoarthritis of hip: Secondary | ICD-10-CM | POA: Diagnosis not present

## 2018-02-24 DIAGNOSIS — I129 Hypertensive chronic kidney disease with stage 1 through stage 4 chronic kidney disease, or unspecified chronic kidney disease: Secondary | ICD-10-CM | POA: Diagnosis not present

## 2018-02-27 DIAGNOSIS — M160 Bilateral primary osteoarthritis of hip: Secondary | ICD-10-CM | POA: Diagnosis not present

## 2018-02-27 DIAGNOSIS — S32030D Wedge compression fracture of third lumbar vertebra, subsequent encounter for fracture with routine healing: Secondary | ICD-10-CM | POA: Diagnosis not present

## 2018-02-27 DIAGNOSIS — I129 Hypertensive chronic kidney disease with stage 1 through stage 4 chronic kidney disease, or unspecified chronic kidney disease: Secondary | ICD-10-CM | POA: Diagnosis not present

## 2018-02-27 DIAGNOSIS — M75101 Unspecified rotator cuff tear or rupture of right shoulder, not specified as traumatic: Secondary | ICD-10-CM | POA: Diagnosis not present

## 2018-02-27 DIAGNOSIS — N184 Chronic kidney disease, stage 4 (severe): Secondary | ICD-10-CM | POA: Diagnosis not present

## 2018-02-27 DIAGNOSIS — W19XXXD Unspecified fall, subsequent encounter: Secondary | ICD-10-CM | POA: Diagnosis not present

## 2018-03-01 DIAGNOSIS — M160 Bilateral primary osteoarthritis of hip: Secondary | ICD-10-CM | POA: Diagnosis not present

## 2018-03-01 DIAGNOSIS — N184 Chronic kidney disease, stage 4 (severe): Secondary | ICD-10-CM | POA: Diagnosis not present

## 2018-03-01 DIAGNOSIS — S32030D Wedge compression fracture of third lumbar vertebra, subsequent encounter for fracture with routine healing: Secondary | ICD-10-CM | POA: Diagnosis not present

## 2018-03-01 DIAGNOSIS — M75101 Unspecified rotator cuff tear or rupture of right shoulder, not specified as traumatic: Secondary | ICD-10-CM | POA: Diagnosis not present

## 2018-03-01 DIAGNOSIS — I129 Hypertensive chronic kidney disease with stage 1 through stage 4 chronic kidney disease, or unspecified chronic kidney disease: Secondary | ICD-10-CM | POA: Diagnosis not present

## 2018-03-01 DIAGNOSIS — W19XXXD Unspecified fall, subsequent encounter: Secondary | ICD-10-CM | POA: Diagnosis not present

## 2018-03-04 DIAGNOSIS — M75101 Unspecified rotator cuff tear or rupture of right shoulder, not specified as traumatic: Secondary | ICD-10-CM | POA: Diagnosis not present

## 2018-03-04 DIAGNOSIS — M160 Bilateral primary osteoarthritis of hip: Secondary | ICD-10-CM | POA: Diagnosis not present

## 2018-03-04 DIAGNOSIS — N184 Chronic kidney disease, stage 4 (severe): Secondary | ICD-10-CM | POA: Diagnosis not present

## 2018-03-04 DIAGNOSIS — I129 Hypertensive chronic kidney disease with stage 1 through stage 4 chronic kidney disease, or unspecified chronic kidney disease: Secondary | ICD-10-CM | POA: Diagnosis not present

## 2018-03-04 DIAGNOSIS — W19XXXD Unspecified fall, subsequent encounter: Secondary | ICD-10-CM | POA: Diagnosis not present

## 2018-03-04 DIAGNOSIS — S32030D Wedge compression fracture of third lumbar vertebra, subsequent encounter for fracture with routine healing: Secondary | ICD-10-CM | POA: Diagnosis not present

## 2018-03-05 DIAGNOSIS — M545 Low back pain: Secondary | ICD-10-CM | POA: Diagnosis not present

## 2018-03-05 DIAGNOSIS — M1990 Unspecified osteoarthritis, unspecified site: Secondary | ICD-10-CM | POA: Diagnosis not present

## 2018-03-05 DIAGNOSIS — I10 Essential (primary) hypertension: Secondary | ICD-10-CM | POA: Diagnosis not present

## 2018-03-05 DIAGNOSIS — F0390 Unspecified dementia without behavioral disturbance: Secondary | ICD-10-CM | POA: Diagnosis not present

## 2018-03-06 DIAGNOSIS — N184 Chronic kidney disease, stage 4 (severe): Secondary | ICD-10-CM | POA: Diagnosis not present

## 2018-03-06 DIAGNOSIS — M75101 Unspecified rotator cuff tear or rupture of right shoulder, not specified as traumatic: Secondary | ICD-10-CM | POA: Diagnosis not present

## 2018-03-06 DIAGNOSIS — W19XXXD Unspecified fall, subsequent encounter: Secondary | ICD-10-CM | POA: Diagnosis not present

## 2018-03-06 DIAGNOSIS — S32030D Wedge compression fracture of third lumbar vertebra, subsequent encounter for fracture with routine healing: Secondary | ICD-10-CM | POA: Diagnosis not present

## 2018-03-06 DIAGNOSIS — M160 Bilateral primary osteoarthritis of hip: Secondary | ICD-10-CM | POA: Diagnosis not present

## 2018-03-06 DIAGNOSIS — I129 Hypertensive chronic kidney disease with stage 1 through stage 4 chronic kidney disease, or unspecified chronic kidney disease: Secondary | ICD-10-CM | POA: Diagnosis not present

## 2018-03-07 DIAGNOSIS — W19XXXD Unspecified fall, subsequent encounter: Secondary | ICD-10-CM | POA: Diagnosis not present

## 2018-03-07 DIAGNOSIS — I129 Hypertensive chronic kidney disease with stage 1 through stage 4 chronic kidney disease, or unspecified chronic kidney disease: Secondary | ICD-10-CM | POA: Diagnosis not present

## 2018-03-07 DIAGNOSIS — M160 Bilateral primary osteoarthritis of hip: Secondary | ICD-10-CM | POA: Diagnosis not present

## 2018-03-07 DIAGNOSIS — S32030D Wedge compression fracture of third lumbar vertebra, subsequent encounter for fracture with routine healing: Secondary | ICD-10-CM | POA: Diagnosis not present

## 2018-03-07 DIAGNOSIS — M75101 Unspecified rotator cuff tear or rupture of right shoulder, not specified as traumatic: Secondary | ICD-10-CM | POA: Diagnosis not present

## 2018-03-07 DIAGNOSIS — N184 Chronic kidney disease, stage 4 (severe): Secondary | ICD-10-CM | POA: Diagnosis not present

## 2018-03-10 DIAGNOSIS — W19XXXD Unspecified fall, subsequent encounter: Secondary | ICD-10-CM | POA: Diagnosis not present

## 2018-03-10 DIAGNOSIS — M160 Bilateral primary osteoarthritis of hip: Secondary | ICD-10-CM | POA: Diagnosis not present

## 2018-03-10 DIAGNOSIS — N184 Chronic kidney disease, stage 4 (severe): Secondary | ICD-10-CM | POA: Diagnosis not present

## 2018-03-10 DIAGNOSIS — M75101 Unspecified rotator cuff tear or rupture of right shoulder, not specified as traumatic: Secondary | ICD-10-CM | POA: Diagnosis not present

## 2018-03-10 DIAGNOSIS — S32030D Wedge compression fracture of third lumbar vertebra, subsequent encounter for fracture with routine healing: Secondary | ICD-10-CM | POA: Diagnosis not present

## 2018-03-10 DIAGNOSIS — I129 Hypertensive chronic kidney disease with stage 1 through stage 4 chronic kidney disease, or unspecified chronic kidney disease: Secondary | ICD-10-CM | POA: Diagnosis not present

## 2018-03-11 DIAGNOSIS — M160 Bilateral primary osteoarthritis of hip: Secondary | ICD-10-CM | POA: Diagnosis not present

## 2018-03-11 DIAGNOSIS — S32030D Wedge compression fracture of third lumbar vertebra, subsequent encounter for fracture with routine healing: Secondary | ICD-10-CM | POA: Diagnosis not present

## 2018-03-11 DIAGNOSIS — N184 Chronic kidney disease, stage 4 (severe): Secondary | ICD-10-CM | POA: Diagnosis not present

## 2018-03-11 DIAGNOSIS — M75101 Unspecified rotator cuff tear or rupture of right shoulder, not specified as traumatic: Secondary | ICD-10-CM | POA: Diagnosis not present

## 2018-03-11 DIAGNOSIS — W19XXXD Unspecified fall, subsequent encounter: Secondary | ICD-10-CM | POA: Diagnosis not present

## 2018-03-11 DIAGNOSIS — I129 Hypertensive chronic kidney disease with stage 1 through stage 4 chronic kidney disease, or unspecified chronic kidney disease: Secondary | ICD-10-CM | POA: Diagnosis not present

## 2018-03-12 DIAGNOSIS — S32030D Wedge compression fracture of third lumbar vertebra, subsequent encounter for fracture with routine healing: Secondary | ICD-10-CM | POA: Diagnosis not present

## 2018-03-12 DIAGNOSIS — Z23 Encounter for immunization: Secondary | ICD-10-CM | POA: Diagnosis not present

## 2018-03-12 DIAGNOSIS — I129 Hypertensive chronic kidney disease with stage 1 through stage 4 chronic kidney disease, or unspecified chronic kidney disease: Secondary | ICD-10-CM | POA: Diagnosis not present

## 2018-03-12 DIAGNOSIS — W19XXXD Unspecified fall, subsequent encounter: Secondary | ICD-10-CM | POA: Diagnosis not present

## 2018-03-12 DIAGNOSIS — M75101 Unspecified rotator cuff tear or rupture of right shoulder, not specified as traumatic: Secondary | ICD-10-CM | POA: Diagnosis not present

## 2018-03-12 DIAGNOSIS — N184 Chronic kidney disease, stage 4 (severe): Secondary | ICD-10-CM | POA: Diagnosis not present

## 2018-03-12 DIAGNOSIS — M160 Bilateral primary osteoarthritis of hip: Secondary | ICD-10-CM | POA: Diagnosis not present

## 2018-03-14 DIAGNOSIS — M75101 Unspecified rotator cuff tear or rupture of right shoulder, not specified as traumatic: Secondary | ICD-10-CM | POA: Diagnosis not present

## 2018-03-14 DIAGNOSIS — M160 Bilateral primary osteoarthritis of hip: Secondary | ICD-10-CM | POA: Diagnosis not present

## 2018-03-14 DIAGNOSIS — W19XXXD Unspecified fall, subsequent encounter: Secondary | ICD-10-CM | POA: Diagnosis not present

## 2018-03-14 DIAGNOSIS — S32030D Wedge compression fracture of third lumbar vertebra, subsequent encounter for fracture with routine healing: Secondary | ICD-10-CM | POA: Diagnosis not present

## 2018-03-14 DIAGNOSIS — N184 Chronic kidney disease, stage 4 (severe): Secondary | ICD-10-CM | POA: Diagnosis not present

## 2018-03-14 DIAGNOSIS — I129 Hypertensive chronic kidney disease with stage 1 through stage 4 chronic kidney disease, or unspecified chronic kidney disease: Secondary | ICD-10-CM | POA: Diagnosis not present

## 2018-03-17 DIAGNOSIS — M160 Bilateral primary osteoarthritis of hip: Secondary | ICD-10-CM | POA: Diagnosis not present

## 2018-03-17 DIAGNOSIS — I129 Hypertensive chronic kidney disease with stage 1 through stage 4 chronic kidney disease, or unspecified chronic kidney disease: Secondary | ICD-10-CM | POA: Diagnosis not present

## 2018-03-17 DIAGNOSIS — W19XXXD Unspecified fall, subsequent encounter: Secondary | ICD-10-CM | POA: Diagnosis not present

## 2018-03-17 DIAGNOSIS — N184 Chronic kidney disease, stage 4 (severe): Secondary | ICD-10-CM | POA: Diagnosis not present

## 2018-03-17 DIAGNOSIS — M75101 Unspecified rotator cuff tear or rupture of right shoulder, not specified as traumatic: Secondary | ICD-10-CM | POA: Diagnosis not present

## 2018-03-17 DIAGNOSIS — S32030D Wedge compression fracture of third lumbar vertebra, subsequent encounter for fracture with routine healing: Secondary | ICD-10-CM | POA: Diagnosis not present

## 2018-03-18 DIAGNOSIS — I129 Hypertensive chronic kidney disease with stage 1 through stage 4 chronic kidney disease, or unspecified chronic kidney disease: Secondary | ICD-10-CM | POA: Diagnosis not present

## 2018-03-18 DIAGNOSIS — W19XXXD Unspecified fall, subsequent encounter: Secondary | ICD-10-CM | POA: Diagnosis not present

## 2018-03-18 DIAGNOSIS — S32030D Wedge compression fracture of third lumbar vertebra, subsequent encounter for fracture with routine healing: Secondary | ICD-10-CM | POA: Diagnosis not present

## 2018-03-18 DIAGNOSIS — M75101 Unspecified rotator cuff tear or rupture of right shoulder, not specified as traumatic: Secondary | ICD-10-CM | POA: Diagnosis not present

## 2018-03-18 DIAGNOSIS — N184 Chronic kidney disease, stage 4 (severe): Secondary | ICD-10-CM | POA: Diagnosis not present

## 2018-03-18 DIAGNOSIS — M160 Bilateral primary osteoarthritis of hip: Secondary | ICD-10-CM | POA: Diagnosis not present

## 2018-03-21 DIAGNOSIS — S32030D Wedge compression fracture of third lumbar vertebra, subsequent encounter for fracture with routine healing: Secondary | ICD-10-CM | POA: Diagnosis not present

## 2018-03-21 DIAGNOSIS — W19XXXD Unspecified fall, subsequent encounter: Secondary | ICD-10-CM | POA: Diagnosis not present

## 2018-03-21 DIAGNOSIS — M160 Bilateral primary osteoarthritis of hip: Secondary | ICD-10-CM | POA: Diagnosis not present

## 2018-03-21 DIAGNOSIS — M75101 Unspecified rotator cuff tear or rupture of right shoulder, not specified as traumatic: Secondary | ICD-10-CM | POA: Diagnosis not present

## 2018-03-21 DIAGNOSIS — I129 Hypertensive chronic kidney disease with stage 1 through stage 4 chronic kidney disease, or unspecified chronic kidney disease: Secondary | ICD-10-CM | POA: Diagnosis not present

## 2018-03-21 DIAGNOSIS — N184 Chronic kidney disease, stage 4 (severe): Secondary | ICD-10-CM | POA: Diagnosis not present

## 2018-03-24 DIAGNOSIS — W19XXXD Unspecified fall, subsequent encounter: Secondary | ICD-10-CM | POA: Diagnosis not present

## 2018-03-24 DIAGNOSIS — N184 Chronic kidney disease, stage 4 (severe): Secondary | ICD-10-CM | POA: Diagnosis not present

## 2018-03-24 DIAGNOSIS — S32030D Wedge compression fracture of third lumbar vertebra, subsequent encounter for fracture with routine healing: Secondary | ICD-10-CM | POA: Diagnosis not present

## 2018-03-24 DIAGNOSIS — I129 Hypertensive chronic kidney disease with stage 1 through stage 4 chronic kidney disease, or unspecified chronic kidney disease: Secondary | ICD-10-CM | POA: Diagnosis not present

## 2018-03-24 DIAGNOSIS — M160 Bilateral primary osteoarthritis of hip: Secondary | ICD-10-CM | POA: Diagnosis not present

## 2018-03-24 DIAGNOSIS — M75101 Unspecified rotator cuff tear or rupture of right shoulder, not specified as traumatic: Secondary | ICD-10-CM | POA: Diagnosis not present

## 2018-03-25 DIAGNOSIS — M75101 Unspecified rotator cuff tear or rupture of right shoulder, not specified as traumatic: Secondary | ICD-10-CM | POA: Diagnosis not present

## 2018-03-25 DIAGNOSIS — N184 Chronic kidney disease, stage 4 (severe): Secondary | ICD-10-CM | POA: Diagnosis not present

## 2018-03-25 DIAGNOSIS — M160 Bilateral primary osteoarthritis of hip: Secondary | ICD-10-CM | POA: Diagnosis not present

## 2018-03-25 DIAGNOSIS — W19XXXD Unspecified fall, subsequent encounter: Secondary | ICD-10-CM | POA: Diagnosis not present

## 2018-03-25 DIAGNOSIS — I129 Hypertensive chronic kidney disease with stage 1 through stage 4 chronic kidney disease, or unspecified chronic kidney disease: Secondary | ICD-10-CM | POA: Diagnosis not present

## 2018-03-25 DIAGNOSIS — S32030D Wedge compression fracture of third lumbar vertebra, subsequent encounter for fracture with routine healing: Secondary | ICD-10-CM | POA: Diagnosis not present

## 2018-03-27 DIAGNOSIS — M75101 Unspecified rotator cuff tear or rupture of right shoulder, not specified as traumatic: Secondary | ICD-10-CM | POA: Diagnosis not present

## 2018-03-27 DIAGNOSIS — W19XXXD Unspecified fall, subsequent encounter: Secondary | ICD-10-CM | POA: Diagnosis not present

## 2018-03-27 DIAGNOSIS — S32030D Wedge compression fracture of third lumbar vertebra, subsequent encounter for fracture with routine healing: Secondary | ICD-10-CM | POA: Diagnosis not present

## 2018-03-27 DIAGNOSIS — I129 Hypertensive chronic kidney disease with stage 1 through stage 4 chronic kidney disease, or unspecified chronic kidney disease: Secondary | ICD-10-CM | POA: Diagnosis not present

## 2018-03-27 DIAGNOSIS — M160 Bilateral primary osteoarthritis of hip: Secondary | ICD-10-CM | POA: Diagnosis not present

## 2018-03-27 DIAGNOSIS — N184 Chronic kidney disease, stage 4 (severe): Secondary | ICD-10-CM | POA: Diagnosis not present

## 2018-03-29 DIAGNOSIS — N390 Urinary tract infection, site not specified: Secondary | ICD-10-CM | POA: Diagnosis not present

## 2018-03-31 DIAGNOSIS — W19XXXD Unspecified fall, subsequent encounter: Secondary | ICD-10-CM | POA: Diagnosis not present

## 2018-03-31 DIAGNOSIS — M75101 Unspecified rotator cuff tear or rupture of right shoulder, not specified as traumatic: Secondary | ICD-10-CM | POA: Diagnosis not present

## 2018-03-31 DIAGNOSIS — S32030D Wedge compression fracture of third lumbar vertebra, subsequent encounter for fracture with routine healing: Secondary | ICD-10-CM | POA: Diagnosis not present

## 2018-03-31 DIAGNOSIS — N184 Chronic kidney disease, stage 4 (severe): Secondary | ICD-10-CM | POA: Diagnosis not present

## 2018-03-31 DIAGNOSIS — M160 Bilateral primary osteoarthritis of hip: Secondary | ICD-10-CM | POA: Diagnosis not present

## 2018-03-31 DIAGNOSIS — I129 Hypertensive chronic kidney disease with stage 1 through stage 4 chronic kidney disease, or unspecified chronic kidney disease: Secondary | ICD-10-CM | POA: Diagnosis not present

## 2018-04-03 DIAGNOSIS — M75101 Unspecified rotator cuff tear or rupture of right shoulder, not specified as traumatic: Secondary | ICD-10-CM | POA: Diagnosis not present

## 2018-04-03 DIAGNOSIS — W19XXXD Unspecified fall, subsequent encounter: Secondary | ICD-10-CM | POA: Diagnosis not present

## 2018-04-03 DIAGNOSIS — N184 Chronic kidney disease, stage 4 (severe): Secondary | ICD-10-CM | POA: Diagnosis not present

## 2018-04-03 DIAGNOSIS — I129 Hypertensive chronic kidney disease with stage 1 through stage 4 chronic kidney disease, or unspecified chronic kidney disease: Secondary | ICD-10-CM | POA: Diagnosis not present

## 2018-04-03 DIAGNOSIS — S32030D Wedge compression fracture of third lumbar vertebra, subsequent encounter for fracture with routine healing: Secondary | ICD-10-CM | POA: Diagnosis not present

## 2018-04-03 DIAGNOSIS — L600 Ingrowing nail: Secondary | ICD-10-CM | POA: Diagnosis not present

## 2018-04-03 DIAGNOSIS — M160 Bilateral primary osteoarthritis of hip: Secondary | ICD-10-CM | POA: Diagnosis not present

## 2018-04-03 DIAGNOSIS — M79674 Pain in right toe(s): Secondary | ICD-10-CM | POA: Diagnosis not present

## 2018-04-07 DIAGNOSIS — M160 Bilateral primary osteoarthritis of hip: Secondary | ICD-10-CM | POA: Diagnosis not present

## 2018-04-07 DIAGNOSIS — N184 Chronic kidney disease, stage 4 (severe): Secondary | ICD-10-CM | POA: Diagnosis not present

## 2018-04-07 DIAGNOSIS — M75101 Unspecified rotator cuff tear or rupture of right shoulder, not specified as traumatic: Secondary | ICD-10-CM | POA: Diagnosis not present

## 2018-04-07 DIAGNOSIS — S32030D Wedge compression fracture of third lumbar vertebra, subsequent encounter for fracture with routine healing: Secondary | ICD-10-CM | POA: Diagnosis not present

## 2018-04-07 DIAGNOSIS — I129 Hypertensive chronic kidney disease with stage 1 through stage 4 chronic kidney disease, or unspecified chronic kidney disease: Secondary | ICD-10-CM | POA: Diagnosis not present

## 2018-04-07 DIAGNOSIS — W19XXXD Unspecified fall, subsequent encounter: Secondary | ICD-10-CM | POA: Diagnosis not present

## 2018-04-09 DIAGNOSIS — N184 Chronic kidney disease, stage 4 (severe): Secondary | ICD-10-CM | POA: Diagnosis not present

## 2018-04-09 DIAGNOSIS — M160 Bilateral primary osteoarthritis of hip: Secondary | ICD-10-CM | POA: Diagnosis not present

## 2018-04-09 DIAGNOSIS — M75101 Unspecified rotator cuff tear or rupture of right shoulder, not specified as traumatic: Secondary | ICD-10-CM | POA: Diagnosis not present

## 2018-04-09 DIAGNOSIS — I129 Hypertensive chronic kidney disease with stage 1 through stage 4 chronic kidney disease, or unspecified chronic kidney disease: Secondary | ICD-10-CM | POA: Diagnosis not present

## 2018-04-09 DIAGNOSIS — W19XXXD Unspecified fall, subsequent encounter: Secondary | ICD-10-CM | POA: Diagnosis not present

## 2018-04-09 DIAGNOSIS — S32030D Wedge compression fracture of third lumbar vertebra, subsequent encounter for fracture with routine healing: Secondary | ICD-10-CM | POA: Diagnosis not present

## 2018-04-10 DIAGNOSIS — N184 Chronic kidney disease, stage 4 (severe): Secondary | ICD-10-CM | POA: Diagnosis not present

## 2018-04-10 DIAGNOSIS — M75101 Unspecified rotator cuff tear or rupture of right shoulder, not specified as traumatic: Secondary | ICD-10-CM | POA: Diagnosis not present

## 2018-04-10 DIAGNOSIS — W19XXXD Unspecified fall, subsequent encounter: Secondary | ICD-10-CM | POA: Diagnosis not present

## 2018-04-10 DIAGNOSIS — S32030D Wedge compression fracture of third lumbar vertebra, subsequent encounter for fracture with routine healing: Secondary | ICD-10-CM | POA: Diagnosis not present

## 2018-04-10 DIAGNOSIS — M160 Bilateral primary osteoarthritis of hip: Secondary | ICD-10-CM | POA: Diagnosis not present

## 2018-04-10 DIAGNOSIS — I129 Hypertensive chronic kidney disease with stage 1 through stage 4 chronic kidney disease, or unspecified chronic kidney disease: Secondary | ICD-10-CM | POA: Diagnosis not present

## 2018-04-15 DIAGNOSIS — M160 Bilateral primary osteoarthritis of hip: Secondary | ICD-10-CM | POA: Diagnosis not present

## 2018-04-15 DIAGNOSIS — W19XXXD Unspecified fall, subsequent encounter: Secondary | ICD-10-CM | POA: Diagnosis not present

## 2018-04-15 DIAGNOSIS — N184 Chronic kidney disease, stage 4 (severe): Secondary | ICD-10-CM | POA: Diagnosis not present

## 2018-04-15 DIAGNOSIS — I129 Hypertensive chronic kidney disease with stage 1 through stage 4 chronic kidney disease, or unspecified chronic kidney disease: Secondary | ICD-10-CM | POA: Diagnosis not present

## 2018-04-15 DIAGNOSIS — S32030D Wedge compression fracture of third lumbar vertebra, subsequent encounter for fracture with routine healing: Secondary | ICD-10-CM | POA: Diagnosis not present

## 2018-04-15 DIAGNOSIS — M75101 Unspecified rotator cuff tear or rupture of right shoulder, not specified as traumatic: Secondary | ICD-10-CM | POA: Diagnosis not present

## 2018-04-18 DIAGNOSIS — I129 Hypertensive chronic kidney disease with stage 1 through stage 4 chronic kidney disease, or unspecified chronic kidney disease: Secondary | ICD-10-CM | POA: Diagnosis not present

## 2018-04-18 DIAGNOSIS — S32030D Wedge compression fracture of third lumbar vertebra, subsequent encounter for fracture with routine healing: Secondary | ICD-10-CM | POA: Diagnosis not present

## 2018-04-18 DIAGNOSIS — M160 Bilateral primary osteoarthritis of hip: Secondary | ICD-10-CM | POA: Diagnosis not present

## 2018-04-18 DIAGNOSIS — M75101 Unspecified rotator cuff tear or rupture of right shoulder, not specified as traumatic: Secondary | ICD-10-CM | POA: Diagnosis not present

## 2018-04-18 DIAGNOSIS — N184 Chronic kidney disease, stage 4 (severe): Secondary | ICD-10-CM | POA: Diagnosis not present

## 2018-04-18 DIAGNOSIS — W19XXXD Unspecified fall, subsequent encounter: Secondary | ICD-10-CM | POA: Diagnosis not present

## 2018-04-20 ENCOUNTER — Other Ambulatory Visit: Payer: Self-pay

## 2018-04-20 ENCOUNTER — Emergency Department (HOSPITAL_COMMUNITY)
Admission: EM | Admit: 2018-04-20 | Discharge: 2018-04-20 | Disposition: A | Discharge status: Home / Self Care | Payer: Medicare Other | Attending: Emergency Medicine | Admitting: Emergency Medicine

## 2018-04-20 ENCOUNTER — Encounter (HOSPITAL_COMMUNITY): Payer: Self-pay

## 2018-04-20 DIAGNOSIS — R609 Edema, unspecified: Secondary | ICD-10-CM | POA: Diagnosis not present

## 2018-04-20 DIAGNOSIS — I5033 Acute on chronic diastolic (congestive) heart failure: Secondary | ICD-10-CM

## 2018-04-20 DIAGNOSIS — I5030 Unspecified diastolic (congestive) heart failure: Secondary | ICD-10-CM | POA: Insufficient documentation

## 2018-04-20 DIAGNOSIS — R202 Paresthesia of skin: Secondary | ICD-10-CM | POA: Diagnosis not present

## 2018-04-20 DIAGNOSIS — N189 Chronic kidney disease, unspecified: Secondary | ICD-10-CM | POA: Diagnosis not present

## 2018-04-20 DIAGNOSIS — R531 Weakness: Secondary | ICD-10-CM | POA: Diagnosis not present

## 2018-04-20 DIAGNOSIS — I110 Hypertensive heart disease with heart failure: Secondary | ICD-10-CM | POA: Diagnosis not present

## 2018-04-20 DIAGNOSIS — Z79899 Other long term (current) drug therapy: Secondary | ICD-10-CM | POA: Diagnosis not present

## 2018-04-20 DIAGNOSIS — Z743 Need for continuous supervision: Secondary | ICD-10-CM | POA: Diagnosis not present

## 2018-04-20 DIAGNOSIS — Z85828 Personal history of other malignant neoplasm of skin: Secondary | ICD-10-CM | POA: Insufficient documentation

## 2018-04-20 DIAGNOSIS — I130 Hypertensive heart and chronic kidney disease with heart failure and stage 1 through stage 4 chronic kidney disease, or unspecified chronic kidney disease: Secondary | ICD-10-CM | POA: Diagnosis not present

## 2018-04-20 DIAGNOSIS — R600 Localized edema: Secondary | ICD-10-CM | POA: Diagnosis present

## 2018-04-20 DIAGNOSIS — I10 Essential (primary) hypertension: Secondary | ICD-10-CM | POA: Diagnosis not present

## 2018-04-20 LAB — CBC WITH DIFFERENTIAL/PLATELET
ABS IMMATURE GRANULOCYTES: 0.03 10*3/uL (ref 0.00–0.07)
BASOS ABS: 0 10*3/uL (ref 0.0–0.1)
BASOS PCT: 0 %
Eosinophils Absolute: 0.1 10*3/uL (ref 0.0–0.5)
Eosinophils Relative: 2 %
HCT: 33.1 % — ABNORMAL LOW (ref 36.0–46.0)
Hemoglobin: 10.3 g/dL — ABNORMAL LOW (ref 12.0–15.0)
IMMATURE GRANULOCYTES: 0 %
LYMPHS ABS: 3.2 10*3/uL (ref 0.7–4.0)
Lymphocytes Relative: 41 %
MCH: 31.1 pg (ref 26.0–34.0)
MCHC: 31.1 g/dL (ref 30.0–36.0)
MCV: 100 fL (ref 80.0–100.0)
MONOS PCT: 17 %
Monocytes Absolute: 1.4 10*3/uL — ABNORMAL HIGH (ref 0.1–1.0)
NEUTROS ABS: 3.2 10*3/uL (ref 1.7–7.7)
NEUTROS PCT: 40 %
NRBC: 0 % (ref 0.0–0.2)
PLATELETS: 309 10*3/uL (ref 150–400)
RBC: 3.31 MIL/uL — ABNORMAL LOW (ref 3.87–5.11)
RDW: 16.2 % — ABNORMAL HIGH (ref 11.5–15.5)
WBC: 8 10*3/uL (ref 4.0–10.5)

## 2018-04-20 LAB — URINALYSIS, ROUTINE W REFLEX MICROSCOPIC
BILIRUBIN URINE: NEGATIVE
Glucose, UA: NEGATIVE mg/dL
HGB URINE DIPSTICK: NEGATIVE
KETONES UR: NEGATIVE mg/dL
NITRITE: NEGATIVE
PROTEIN: NEGATIVE mg/dL
SPECIFIC GRAVITY, URINE: 1.005 (ref 1.005–1.030)
pH: 7 (ref 5.0–8.0)

## 2018-04-20 LAB — COMPREHENSIVE METABOLIC PANEL
ALBUMIN: 3.1 g/dL — AB (ref 3.5–5.0)
ALT: 15 U/L (ref 0–44)
AST: 27 U/L (ref 15–41)
Alkaline Phosphatase: 59 U/L (ref 38–126)
Anion gap: 8 (ref 5–15)
BUN: 22 mg/dL (ref 8–23)
CHLORIDE: 105 mmol/L (ref 98–111)
CO2: 24 mmol/L (ref 22–32)
Calcium: 9.1 mg/dL (ref 8.9–10.3)
Creatinine, Ser: 1.41 mg/dL — ABNORMAL HIGH (ref 0.44–1.00)
GFR calc Af Amer: 37 mL/min — ABNORMAL LOW (ref >60)
GFR calc non Af Amer: 32 mL/min — ABNORMAL LOW (ref >60)
GLUCOSE: 100 mg/dL — AB (ref 70–99)
Potassium: 4.6 mmol/L (ref 3.5–5.1)
SODIUM: 137 mmol/L (ref 135–145)
Total Bilirubin: 0.4 mg/dL (ref 0.3–1.2)
Total Protein: 6.5 g/dL (ref 6.5–8.1)

## 2018-04-20 LAB — BRAIN NATRIURETIC PEPTIDE: B Natriuretic Peptide: 415 pg/mL — ABNORMAL HIGH (ref 0.0–100.0)

## 2018-04-20 MED ORDER — FUROSEMIDE 40 MG PO TABS: 40.0000 mg | ORAL_TABLET | Freq: Once | ORAL | Status: DC

## 2018-04-20 MED ORDER — FUROSEMIDE 10 MG/ML IJ SOLN
INTRAMUSCULAR | Status: AC
  Filled 2018-04-20: qty 4

## 2018-04-20 MED ORDER — FUROSEMIDE 20 MG PO TABS: 40.0000 mg | ORAL_TABLET | Freq: Every day | ORAL | 0 refills | Status: DC

## 2018-04-20 MED ORDER — FUROSEMIDE 10 MG/ML IJ SOLN
40.0000 mg | Freq: Once | INTRAMUSCULAR | Status: AC
Start: 2018-04-20 — End: 2018-04-20
  Administered 2018-04-20: 40 mg via INTRAVENOUS

## 2018-04-20 NOTE — Discharge Instructions (Signed)
As discussed, today's evaluation was generally reassuring aside from demonstration of possible worsening of your congestive heart failure. Is important to take Lasix 40 mg daily for the next 5 days and follow-up with your physician.  Return here for concerning changes in your condition.

## 2018-04-20 NOTE — ED Notes (Signed)
Report give to [facility]

## 2018-04-20 NOTE — ED Provider Notes (Signed)
[facility] EMERGENCY DEPARTMENT Provider Note   CSN: [id number] Arrival date & time: 04/20/18  1516     History   Chief Complaint Chief Complaint  Patient presents with  . Leg Swelling    HPI FLOELLA ENSZ is a 84 y.o. female.  HPI Patient presents with concern of bilateral lower extremity edema. Onset is unclear, but it seems as though over the past 3 or 4 days the patient has had increasing swelling in her lower extremities. She notes that what was initially appreciable swelling in both ankles has progressed to include swelling to the mid thigh bilaterally. Swelling is uncomfortable, improved with supine positioning with legs elevated, worse with upright positioning. No new dyspnea, chest pain, abdominal pain, lightheadedness, syncope, fever, chills.  Past Medical History:  Diagnosis Date  . Cancer [facility]) March 2016   Skin Cancer :  Right Cheek     SCC  . Cancer [facility]) 2013   Skin Cancer  :  Anterior Neck   SCC  . Carotid artery occlusion   . Chronic kidney disease   . Diverticulitis   . DVT (deep venous thrombosis) ([place]) 07/14/10   LEA doppler   . GI bleeding    2013/2016/2018: suspected diverticular bleeding  . Glaucoma   . Hearing loss   . HTN (hypertension)    cholesterol  . Lumbar spine pain   . Lymphedema   . Macular degeneration   . Pain in joints   . Precancerous changes of the cervix    lesions  . Rectocele   . SOB (shortness of breath) 04/04/2010   2D echo EF=>55%    Patient Active Problem List   Diagnosis Date Noted  . Sepsis ([place]) 12/30/2017  . Pressure injury of skin 12/30/2017  . Cellulitis of left leg 12/30/2017  . HCAP (healthcare-associated pneumonia) 12/30/2017  . Glaucoma (increased eye pressure) 12/30/2017  . Compression fracture of lumbar spine, non-traumatic, initial encounter ([place]) 12/17/2017  . Back pain 12/12/2017  . Abdominal pain 08/28/2017  . Primary osteoarthritis of right hip 07/19/2017  . Tear of right rotator cuff  07/19/2017  . CKD (chronic kidney disease) stage 3, GFR 30-59 ml/min (HCC) 05/22/2017  . Macrocytosis   . Diverticulosis of large intestine with hemorrhage   . Rectal bleed 05/19/2017  . CKD (chronic kidney disease) stage 4, GFR 15-29 ml/min (HCC) 05/19/2017  . LLQ pain 03/08/2017  . Colon polyp 03/08/2017  . Constipation 03/08/2017  . Acute blood loss anemia 09/10/2014  . GIB (gastrointestinal bleeding) 09/08/2014  . Occlusion and stenosis of carotid artery without mention of cerebral infarction 12/14/2013  . Osteoarthritis of left knee 04/23/2013  . Thumb pain 10/22/2012  . De Quervain's syndrome (tenosynovitis) 10/22/2012  . [facility] DJD(carpometacarpal degenerative joint disease), localized primary 10/22/2012  . CTS (carpal tunnel syndrome) 10/22/2012  . Trigger thumb of right hand 10/22/2012  . Pes anserinus bursitis of right knee 06/26/2012  . OA (osteoarthritis) of knee 06/26/2012  . Leg pain, left 06/26/2012  . Radicular pain of left lower extremity 06/26/2012  . Diverticulosis of colon with hemorrhage 09/22/2011  . Syncopal episodes 09/20/2011  . GI bleed 09/19/2011  . HTN (hypertension) 09/19/2011  . Anemia due to blood loss, acute 09/19/2011  . Abnormality of gait 01/03/2011  . Personal history of fall 12/27/2010  . DISLOCATION CLOSED SHOULDER NEC 04/25/2010  . ANSERINE BURSITIS, LEFT 02/13/2010  . KNEE PAIN 12/22/2009  . LEG PAIN, BILATERAL 12/22/2009  . TOTAL KNEE FOLLOW-UP 12/22/2009  . HIP PAIN  02/07/2009  . LOW BACK PAIN 02/07/2009    Past Surgical History:  Procedure Laterality Date  . ABDOMINAL HYSTERECTOMY    . appendix    . bladder tac    . COLONOSCOPY  09/20/2011   Dr. Carol Ada: pandiverticulosis, hemorrhoids. ascending colon polyp not removed in setting of active bleeding.  Marland Kitchen COLONOSCOPY N/A 04/10/2017   Dr. [name]: three 4-6 mm polyps in ascending colon and cecum, s/p removal. tubular adenomas. diverticulosis in entire examined colon.   . corneal  erosion     cataracts   . elevated metatarsal arch    . fallen uterus    . hemmorrhoidectomy    . IR KYPHO LUMBAR INC FX REDUCE BONE BX UNI/BIL CANNULATION INC/IMAGING  12/17/2017  . left total knee replacement  05/02/04   [name]  . met osteostomy    . morton's nueroma    . OVARIAN CYST SURGERY    . RECTOCELE REPAIR    . rupture rt groin    . salk    . SKIN CANCER EXCISION Right March 2016  2013   Cheek -  [facility]  and  Anterior Neck  . tracheotomy       OB History   None      Home Medications    Prior to Admission medications   Medication Sig Start Date End Date Taking? Authorizing Provider  Calcium 600-200 MG-UNIT per tablet Take 1 tablet by mouth daily.     Yes [provider]  acetaminophen (TYLENOL) 500 MG tablet Take 500 mg by mouth every 6 (six) hours as needed (pain).     [provider]  docusate sodium (COLACE) 100 MG capsule Take 100 mg by mouth daily.    [provider]  donepezil (ARICEPT) 5 MG tablet Take 1 tablet (5 mg total) by mouth at bedtime. 01/06/18   Sinda Du, MD  dorzolamide-timolol (COSOPT) 22.3-6.8 MG/ML ophthalmic solution Place 1 drop into both eyes 2 (two) times daily.      [provider]  furosemide (LASIX) 20 MG tablet Take 20 mg by mouth daily. Take 1 tablet by mouth once a day for 7 days    [provider]  ipratropium-albuterol (DUONEB) 0.5-2.5 (3) MG/3ML SOLN Take 3 mLs by nebulization every 6 (six) hours as needed. 01/06/18   Sinda Du, MD  losartan (COZAAR) 50 MG tablet Take 50 mg by mouth daily.  12/25/14   [provider]  LUMIGAN 0.01 % SOLN Place 1 drop into both eyes at bedtime.  03/06/17   [provider]  Melatonin 5 MG TABS Take by mouth at bedtime.    [provider]  Multiple Vitamins-Minerals (PRESERVISION AREDS 2 PO) Take 2 tablets by mouth every morning. One tablet daily     [provider]  NIFEdipine (PROCARDIA-XL/ADALAT-CC/NIFEDICAL-XL) 30  MG 24 hr tablet Take 30 mg by mouth daily. 11/24/17   [provider]  oxybutynin (DITROPAN-XL) 10 MG 24 hr tablet Take 10 mg by mouth daily.     [provider]  pantoprazole (PROTONIX) 40 MG tablet Take 40 mg by mouth daily. 11/25/17   [provider]  polyethylene glycol (MIRALAX / GLYCOLAX) packet Take 17 g by mouth daily.    [provider]  potassium chloride SA (K-DUR,KLOR-CON) 20 MEQ tablet Take 20 mEq by mouth daily. Take 1 tablet by mouth once a day for 10 days    [provider]  promethazine (PHENERGAN) 12.5 MG tablet Take 1 tablet (12.5 mg  total) by mouth every 6 (six) hours as needed for nausea or vomiting. 10/21/17   Carole Civil, MD  traMADol (ULTRAM) 50 MG tablet Take 1 tablet (50 mg total) by mouth every 6 (six) hours as needed. Patient taking differently: Take 25-50 mg by mouth every 6 (six) hours as needed.  10/21/17   Carole Civil, MD    Family History Family History  Problem Relation Age of Onset  . Stroke Mother   . Diabetes Mother   . Heart attack Father   . COPD Paternal Grandfather   . Dementia Sister   . Colon cancer Neg Hx     Social History Social History   Tobacco Use  . Smoking status: Never Smoker  . Smokeless tobacco: Never Used  Substance Use Topics  . Alcohol use: No  . Drug use: No     Allergies   Codeine; Aspirin; Pepto-bismol [bismuth subsalicylate]; and Hydrocodone   Review of Systems Review of Systems  Constitutional:       Per HPI, otherwise negative  HENT:       Per HPI, otherwise negative  Respiratory:       Per HPI, otherwise negative  Cardiovascular:       Per HPI, otherwise negative  Gastrointestinal: Negative for vomiting.  Endocrine:       Negative aside from HPI  Genitourinary:       Neg aside from HPI   Musculoskeletal:       Per HPI, otherwise negative  Skin: Negative.   Neurological: Negative for syncope.     Physical Exam Updated Vital Signs BP (!)  165/109   Pulse 81   Temp 97.6 F (36.4 C) (Oral)   Resp 20   Wt 61.4 kg   SpO2 98%   BMI 23.23 kg/m   Physical Exam  Constitutional: She is oriented to person, place, and time. She appears well-developed and well-nourished. No distress.  HENT:  Head: Normocephalic and atraumatic.  Eyes: Conjunctivae and EOM are normal.  Cardiovascular: Normal rate and regular rhythm.  Pulmonary/Chest: Effort normal and breath sounds normal. No stridor. No respiratory distress.  Abdominal: She exhibits no distension.  Musculoskeletal: She exhibits edema. She exhibits no deformity.  Mild edema both lower extremities  Neurological: She is alert and oriented to person, place, and time. No cranial nerve deficit.  Very poor hearing, otherwise unremarkable neurologic exam aside from age-appropriate atrophy.  Skin: Skin is warm and dry.  Psychiatric: She has a normal mood and affect.  Nursing note and vitals reviewed.    ED Treatments / Results  Labs (all labs ordered are listed, but only abnormal results are displayed) Labs Reviewed  COMPREHENSIVE METABOLIC PANEL - Abnormal; Notable for the following components:      Result Value   Glucose, Bld 100 (*)    Creatinine, Ser 1.41 (*)    Albumin 3.1 (*)    GFR calc non Af Amer 32 (*)    GFR calc Af Amer 37 (*)    All other components within normal limits  BRAIN NATRIURETIC PEPTIDE - Abnormal; Notable for the following components:   B Natriuretic Peptide 415.0 (*)    All other components within normal limits  CBC WITH DIFFERENTIAL/PLATELET - Abnormal; Notable for the following components:   RBC 3.31 (*)    Hemoglobin 10.3 (*)    HCT 33.1 (*)    RDW 16.2 (*)    Monocytes Absolute 1.4 (*)    All other components within normal limits  URINALYSIS, ROUTINE W REFLEX MICROSCOPIC - Abnormal; Notable for the following components:   Color, Urine STRAW (*)    Leukocytes, UA TRACE (*)    Bacteria, UA FEW (*)    All other components within normal limits      EKG None  Radiology No results found.  Procedures Procedures (including critical care time)  Medications Ordered in ED Medications  furosemide (LASIX) tablet 40 mg (has no administration in time range)  furosemide (LASIX) 10 MG/ML injection (has no administration in time range)  furosemide (LASIX) injection 40 mg (has no administration in time range)     Initial Impression / Assessment and Plan / ED Course  I have reviewed the triage vital signs and the nursing notes.  Pertinent labs & imaging results that were available during my care of the patient were reviewed by me and considered in my medical decision making (see chart for details).     5:57 PM She is in no distress, no hypoxia, now company by her daughter. We reviewed all the labs including elevated BNP, consistent creatinine, slightly elevated as well. We discussed likelihood of heart failure exacerbation, though without respiratory compromise, she is appropriate for IV Lasix here, increased Lasix dosing, with outpatient follow-up. Without other concerns, with out hemodynamic instability, the patient discharged in stable condition.  Final Clinical Impressions(s) / ED Diagnoses  Heart failure exacerbation, diastolic   Carmin Muskrat, MD 04/20/18 1758

## 2018-04-20 NOTE — ED Triage Notes (Signed)
Pt from [facility] due to edema in legs. Pt reports lower ext edema for 2 weeks but has progressed up to thighs.

## 2018-04-20 NOTE — ED Notes (Signed)
Family member requests transport back to facility in ambulance.  Will call for transport.  Pt is having results from lasix

## 2018-05-27 DIAGNOSIS — F0390 Unspecified dementia without behavioral disturbance: Secondary | ICD-10-CM | POA: Diagnosis not present

## 2018-05-27 DIAGNOSIS — M159 Polyosteoarthritis, unspecified: Secondary | ICD-10-CM | POA: Diagnosis not present

## 2018-05-27 DIAGNOSIS — I10 Essential (primary) hypertension: Secondary | ICD-10-CM | POA: Diagnosis not present

## 2018-05-27 DIAGNOSIS — G5600 Carpal tunnel syndrome, unspecified upper limb: Secondary | ICD-10-CM | POA: Diagnosis not present

## 2018-08-28 DIAGNOSIS — F0390 Unspecified dementia without behavioral disturbance: Secondary | ICD-10-CM | POA: Diagnosis not present

## 2018-08-28 DIAGNOSIS — I509 Heart failure, unspecified: Secondary | ICD-10-CM | POA: Diagnosis not present

## 2018-08-28 DIAGNOSIS — G5603 Carpal tunnel syndrome, bilateral upper limbs: Secondary | ICD-10-CM | POA: Diagnosis not present

## 2018-08-28 DIAGNOSIS — I129 Hypertensive chronic kidney disease with stage 1 through stage 4 chronic kidney disease, or unspecified chronic kidney disease: Secondary | ICD-10-CM | POA: Diagnosis not present

## 2018-09-03 DIAGNOSIS — K219 Gastro-esophageal reflux disease without esophagitis: Secondary | ICD-10-CM | POA: Diagnosis not present

## 2018-09-03 DIAGNOSIS — N3281 Overactive bladder: Secondary | ICD-10-CM | POA: Diagnosis not present

## 2018-09-03 DIAGNOSIS — I10 Essential (primary) hypertension: Secondary | ICD-10-CM | POA: Diagnosis not present

## 2018-09-03 DIAGNOSIS — K5900 Constipation, unspecified: Secondary | ICD-10-CM | POA: Diagnosis not present

## 2018-09-03 DIAGNOSIS — G8929 Other chronic pain: Secondary | ICD-10-CM | POA: Diagnosis not present

## 2018-09-03 DIAGNOSIS — G4700 Insomnia, unspecified: Secondary | ICD-10-CM | POA: Diagnosis not present

## 2018-09-03 DIAGNOSIS — E876 Hypokalemia: Secondary | ICD-10-CM | POA: Diagnosis not present

## 2018-09-03 DIAGNOSIS — M545 Low back pain: Secondary | ICD-10-CM | POA: Diagnosis not present

## 2018-09-03 DIAGNOSIS — R609 Edema, unspecified: Secondary | ICD-10-CM | POA: Diagnosis not present

## 2018-09-03 DIAGNOSIS — H04123 Dry eye syndrome of bilateral lacrimal glands: Secondary | ICD-10-CM | POA: Diagnosis not present

## 2018-09-03 DIAGNOSIS — H4010X4 Unspecified open-angle glaucoma, indeterminate stage: Secondary | ICD-10-CM | POA: Diagnosis not present

## 2018-09-15 IMAGING — MR MR LUMBAR SPINE W/O CM
4 of 5 series · 14 of 48 positions shown · non-contrast
Comparison: Radiography 12/12/2017

CLINICAL DATA: Fell yesterday.  Back pain.  Negative radiography.

EXAM:
MRI LUMBAR SPINE WITHOUT CONTRAST
TECHNIQUE: Multiplanar, multisequence MR imaging of the lumbar spine was
performed. No intravenous contrast was administered.

[Series 3: T2 · sagittal · 4.0mm · 0.63mm/px · 5 of 15 slices shown (1 of 2)]
[im 1/15]
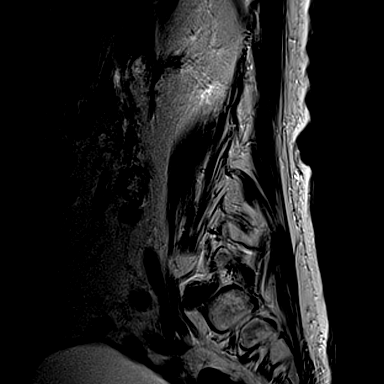
[im 4/15]
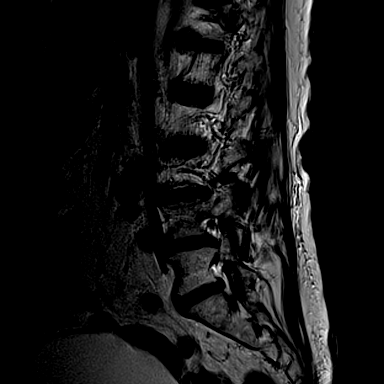
[im 8/15]
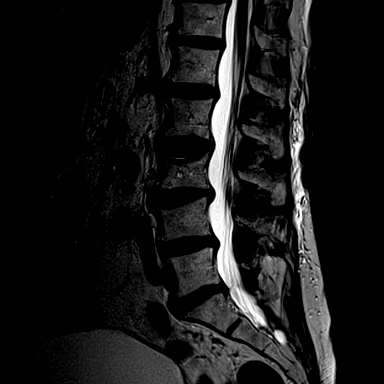
[im 11/15]
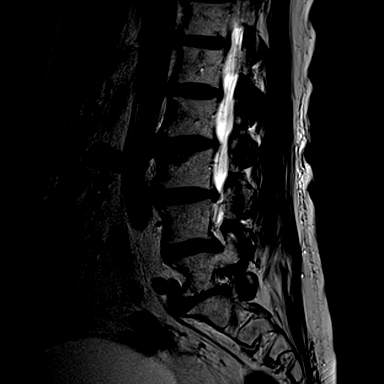
[im 15/15]
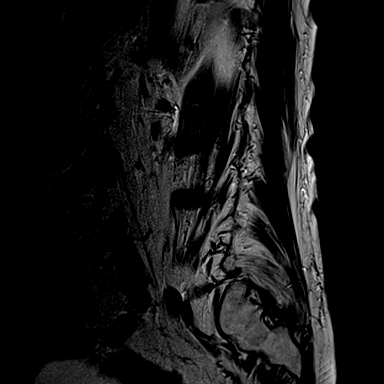

[Series 4: T1 · sagittal · 4.0mm · 0.32mm/px · 3 of 15 slices shown (1 of 2)]
[im 1/15]
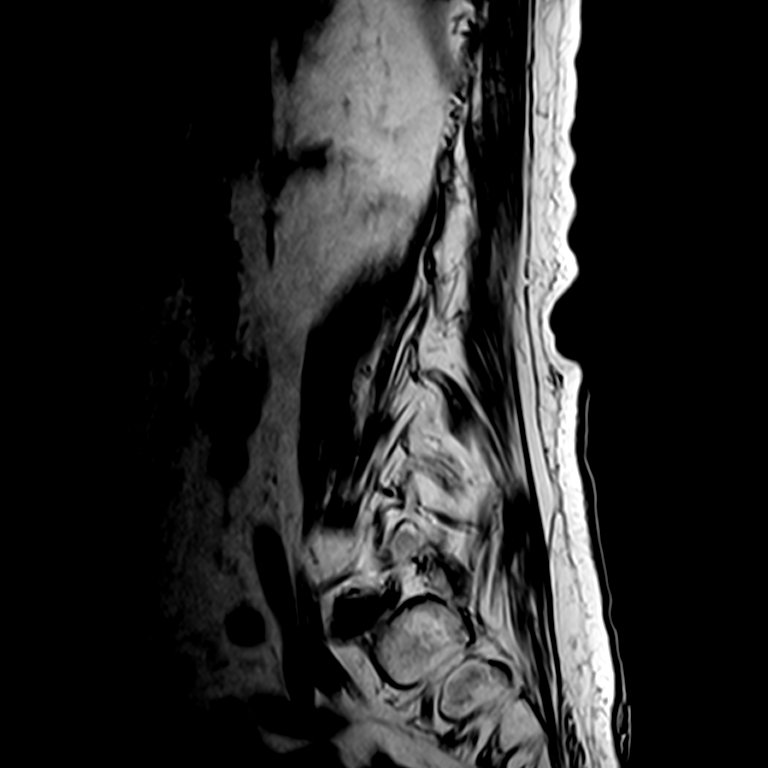
[im 8/15]
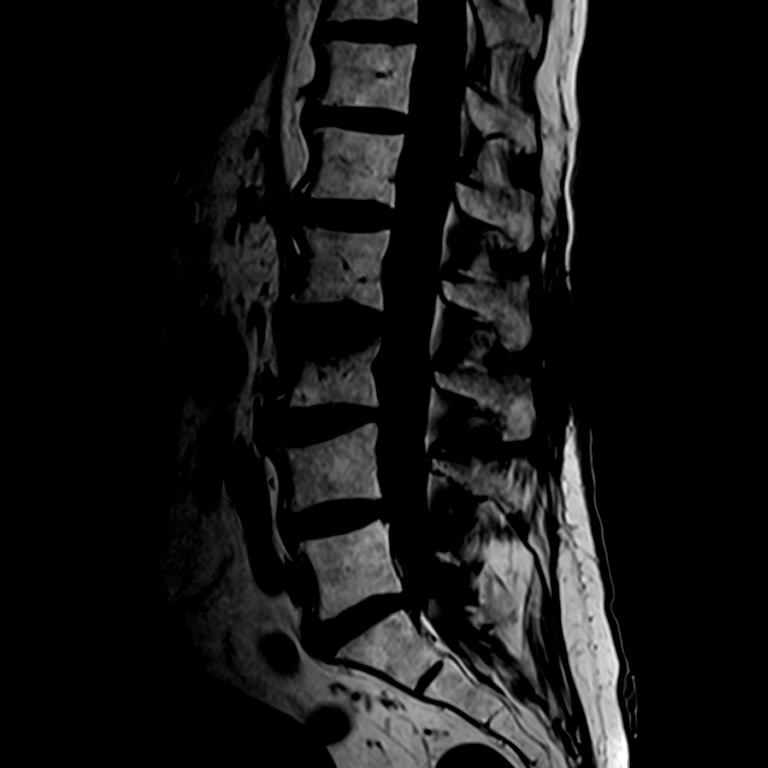
[im 15/15]
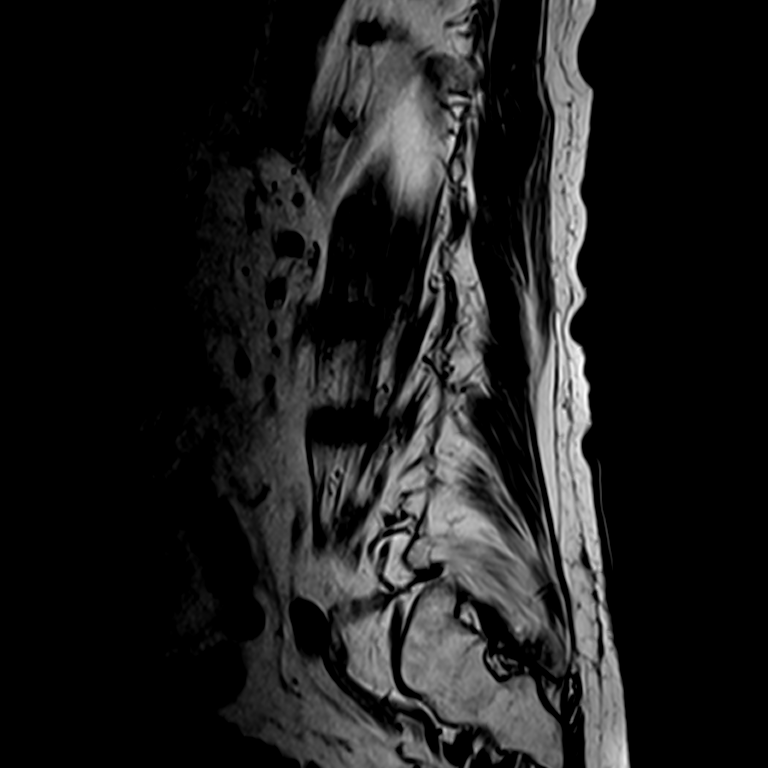

[Series 6: T2 · axial · 4.0mm · 0.21mm/px · z∈[-54,+91]mm · 3 of 42 slices shown (2 of 2)]
[im 6/42]
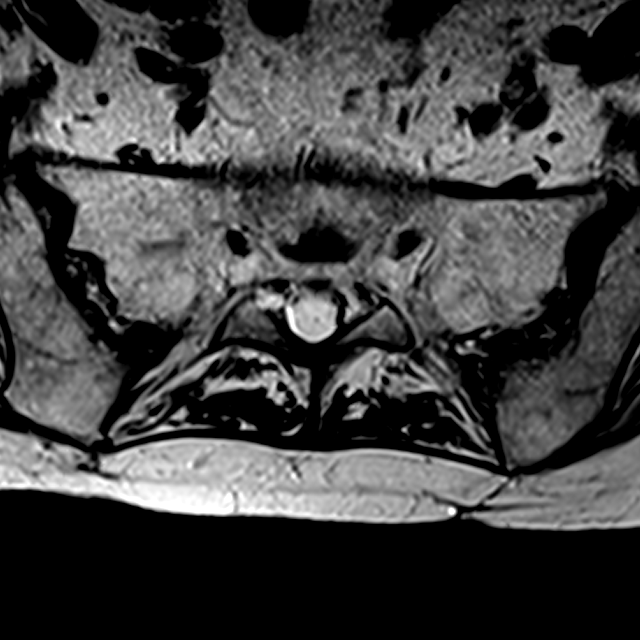
[im 22/42]
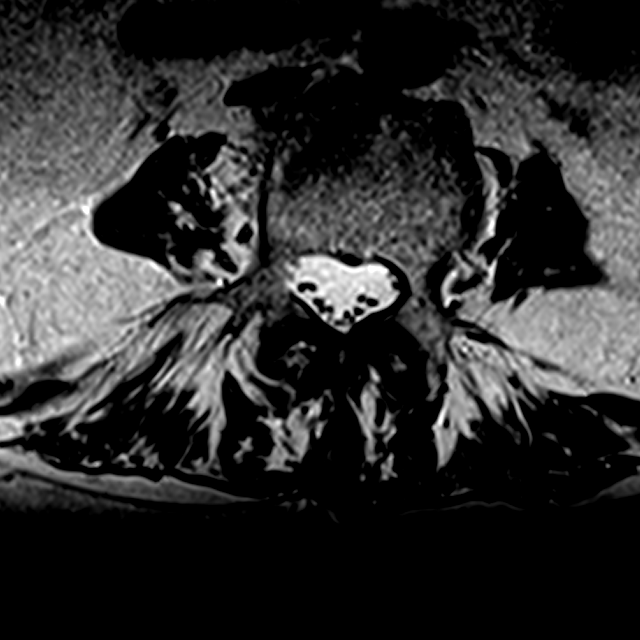
[im 36/42]
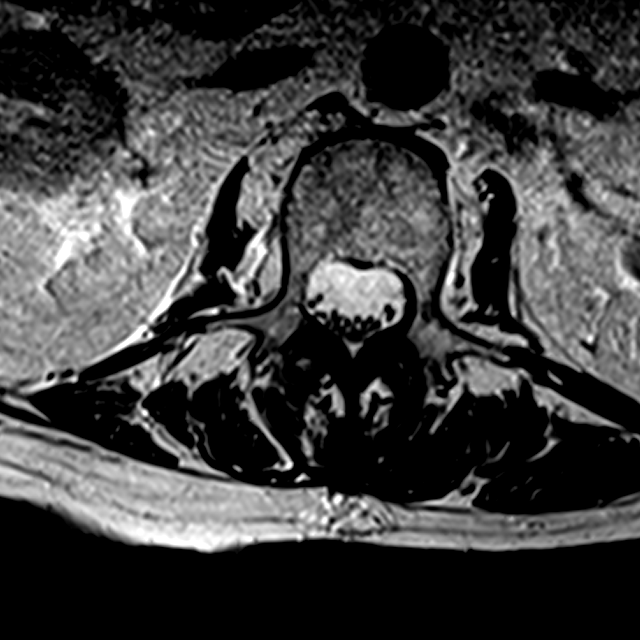

[Series 7: T1 · axial · 4.0mm · 0.19mm/px · z∈[-55,+91]mm · 3 of 42 slices shown (2 of 2)]
[im 6/42]
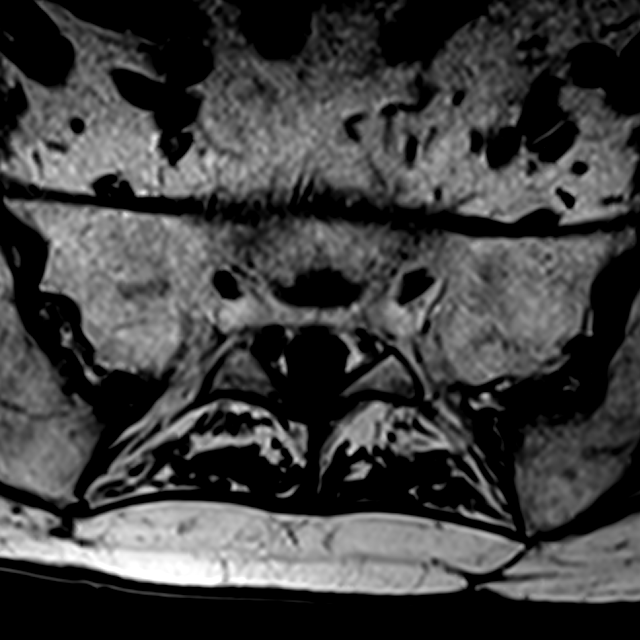
[im 22/42]
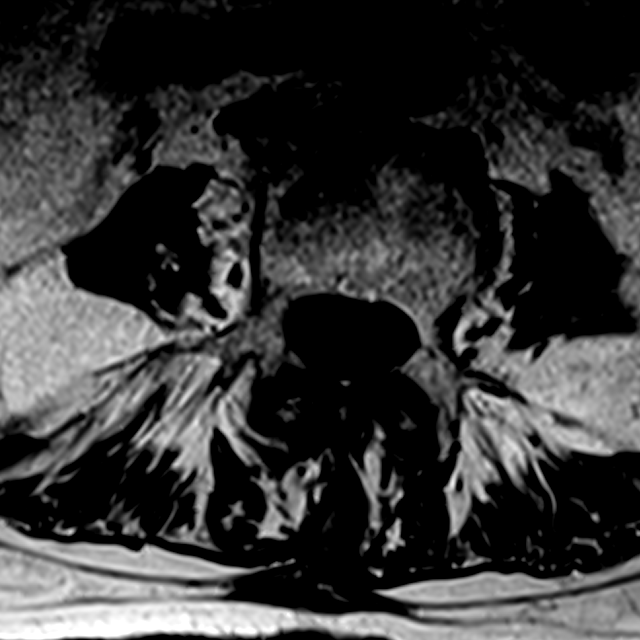
[im 36/42]
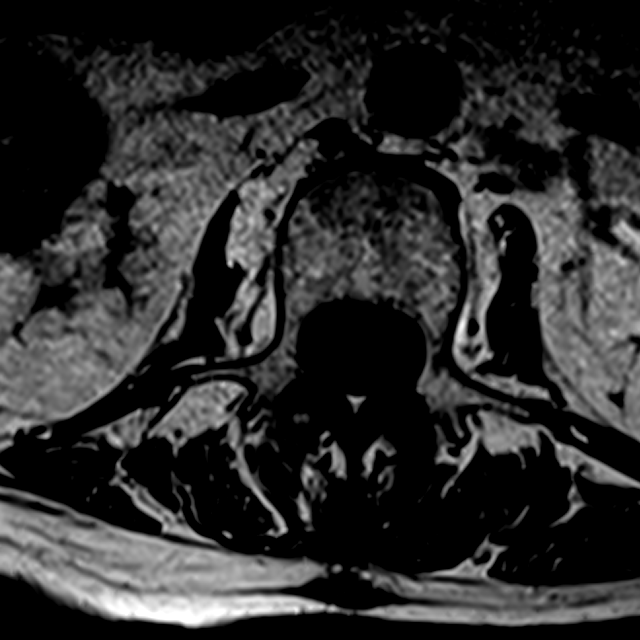

[14 of 48 positions shown; findings below may reference images not displayed]

FINDINGS: Segmentation:  5 lumbar type vertebral bodies.

Alignment:  Mild curvature convex to the left with the apex at L3.

Vertebrae: Acute superior endplate fracture at L3 with less than 10%
loss of height anteriorly. Mild bone marrow edema. No retropulsed
bone. No traumatic disc herniation. No sign of pre-existing
pathologic bone lesion. No other regional fracture or bone lesion.

Conus medullaris and cauda equina: Conus extends to the L1 level.
Conus and cauda equina appear normal.

Paraspinal and other soft tissues: Negative

Disc levels:

Non-compressive disc bulges at L2-3 and above.

L3-4: Moderate disc bulge. Mild facet and ligamentous hypertrophy.
Mild narrowing of the right lateral recess and intervertebral
foramen on the right but without distinct neural compression.

L4-5: Moderate bulging of the disc. Mild facet and ligamentous
hypertrophy. Mild narrowing of both lateral recesses but without
compressive stenosis.

L5-S1: Mild disc bulge. Facet degeneration. Mild left foraminal
narrowing without likely neural compression.
IMPRESSION: Acute superior endplate fracture at L3 with loss of height of
approximately 10% anteriorly. No retropulsed bone. Regional edema.
This looks like a benign fracture.

Ordinary degenerative changes throughout the lumbar region as
outlined above without likely neural compression.

## 2018-09-17 DIAGNOSIS — G3184 Mild cognitive impairment, so stated: Secondary | ICD-10-CM | POA: Diagnosis not present

## 2018-09-17 DIAGNOSIS — F5109 Other insomnia not due to a substance or known physiological condition: Secondary | ICD-10-CM | POA: Diagnosis not present

## 2018-09-19 IMAGING — XA IR KYPHO VERTEBRAL LUMBAR AUGMENTATION
1 series · 13 of 18 positions shown · non-contrast
Comparison: MRI lumbosacral spine of 12/13/2017.

INDICATION: Severe low back pain secondary to compression fracture at L3.

EXAM:
KYPHOPLASTY AT L3

[Series 300: spine · 13 of 18 slices shown]
[im 1/18]
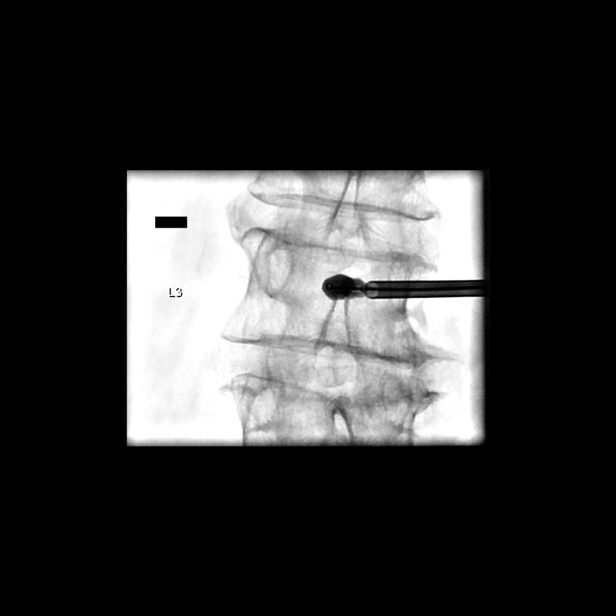
[im 3/18]
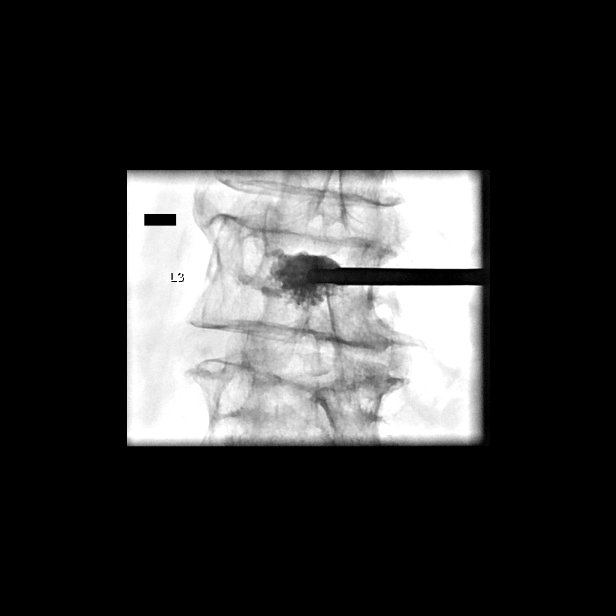
[im 4/18]
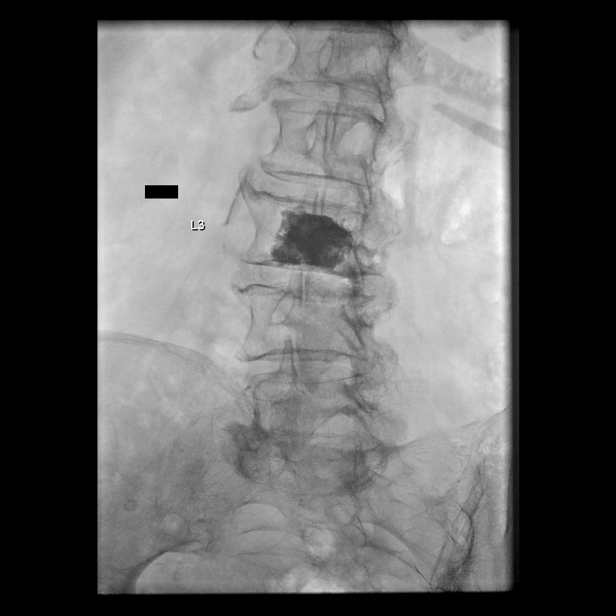
[im 5/18]
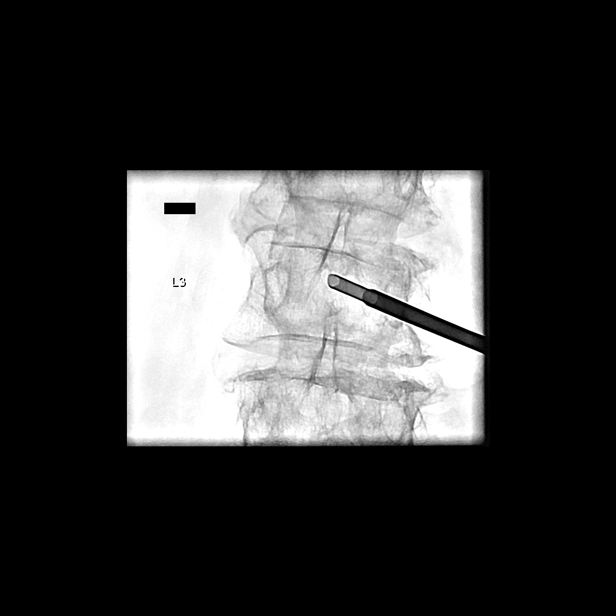
[im 7/18]
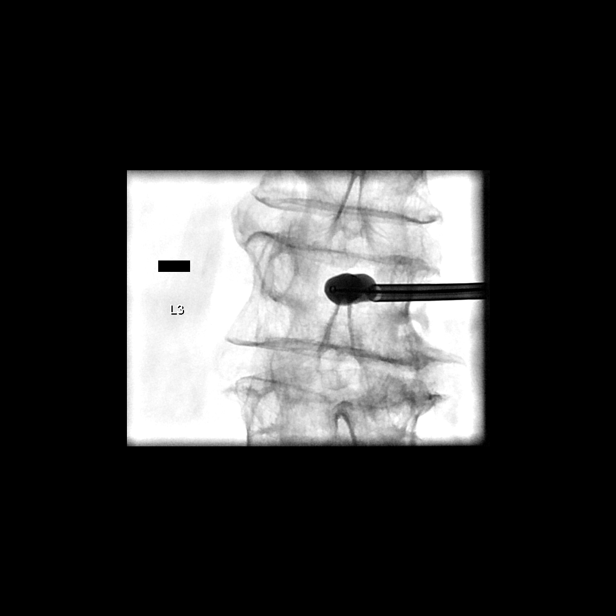
[im 8/18]
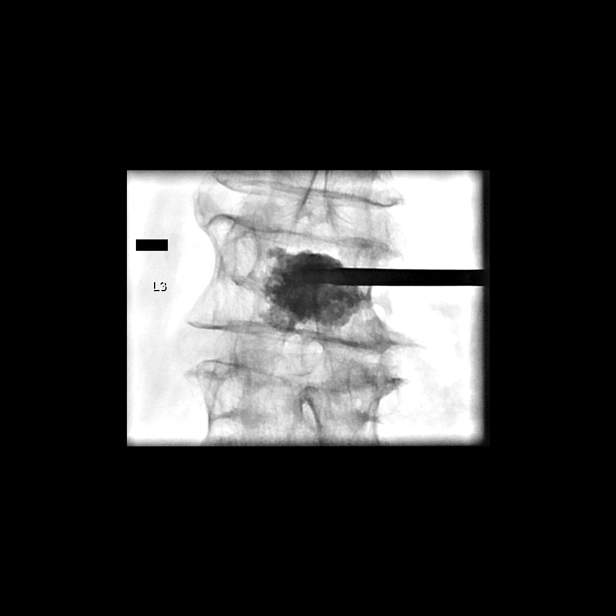
[im 10/18]
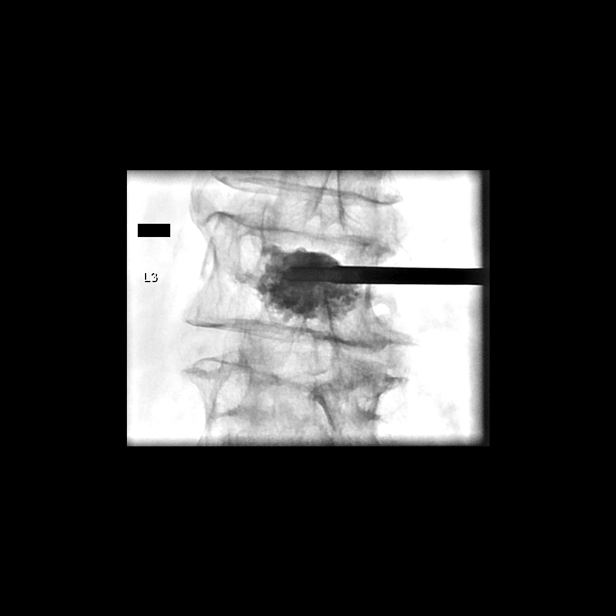
[im 11/18]
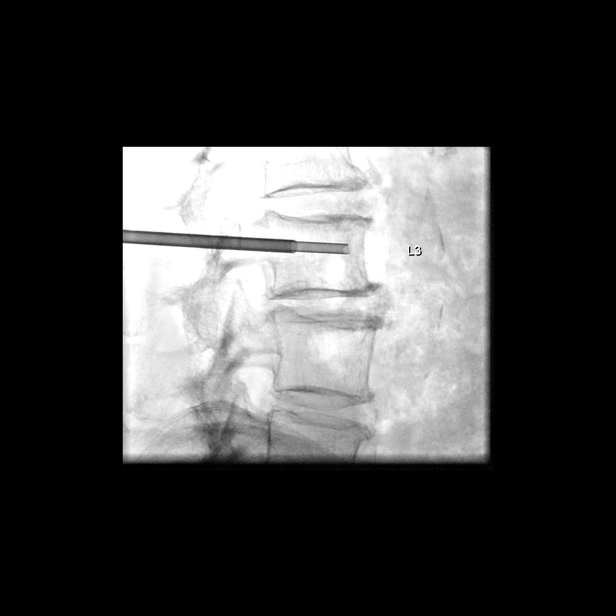
[im 12/18]
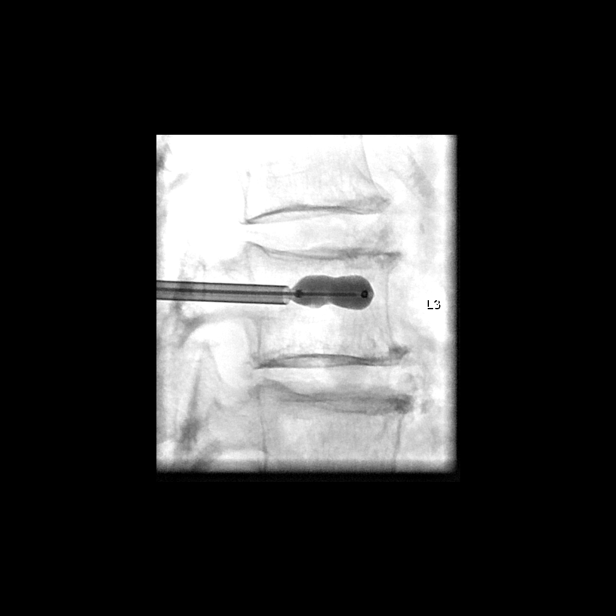
[im 14/18]
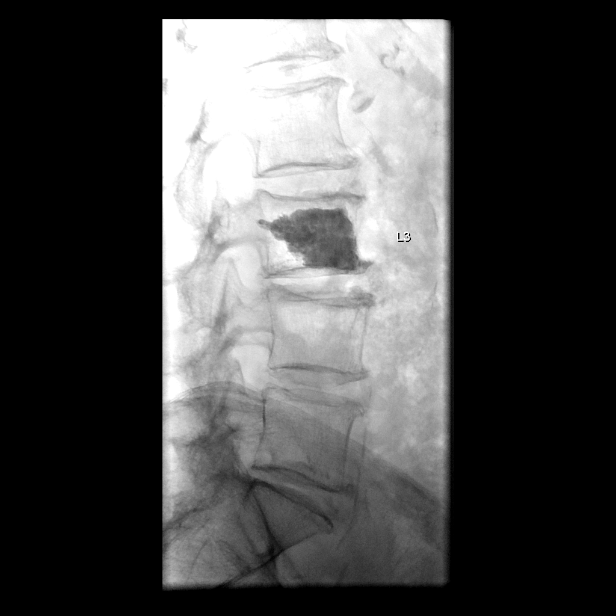
[im 15/18]
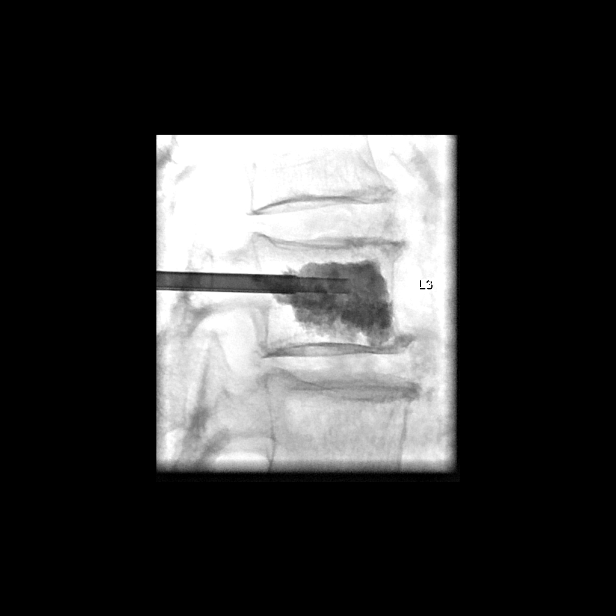
[im 16/18]
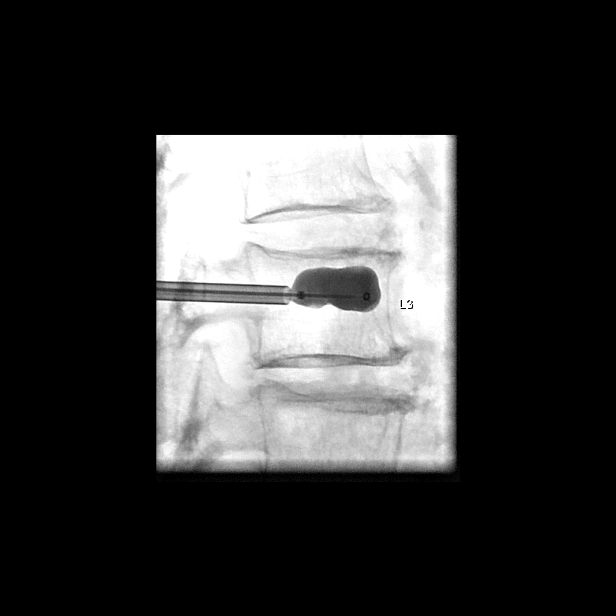
[im 18/18]
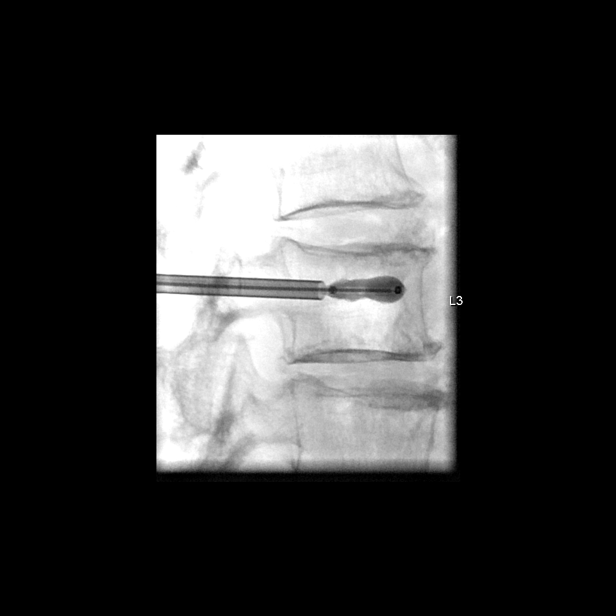

[13 of 18 positions shown; findings below may reference images not displayed]

MEDICATIONS:
As antibiotic prophylaxis, 1 g Ancef was ordered pre-procedure and
administered intravenously within 1 hour of incision.

ANESTHESIA/SEDATION:
Moderate (conscious) sedation was employed during this procedure. A
total of Versed 2 mg and Fentanyl 50 mcg was administered
intravenously.

Moderate Sedation Time: 25 minutes. The patient's level of
consciousness and vital signs were monitored continuously by
radiology nursing throughout the procedure under my direct
supervision.

FLUOROSCOPY TIME:  Fluoroscopy Time: 9 minutes 12 seconds (798 mGy)

COMPLICATIONS:
None immediate.

PROCEDURE:
Following a full explanation of the procedure along with the
potential associated complications, an informed witnessed consent
was obtained.

The patient was placed prone on the fluoroscopic table. The skin
overlying the lumbar region was then prepped and draped in the usual
sterile fashion. The right pedicle at L3 was then infiltrated with
0.25% bupivacaine followed by the advancement of an 11-gauge
Jamshidi needle through the right pedicle into the posterior
one-third at L3. This was then exchanged for a Kyphon advanced osteo
introducer system comprised of a working cannula and a Kyphon osteo
drill.

This combination was then advanced over a Kyphon osteo bone pin
until the tip of the Kyphon osteo drill was in the posterior third
at L3.

At this time, the bone pin was removed. In a medial trajectory, the
combination was advanced until the tip of the working cannula was
inside the posterior one-third at L3.

The osteo drill was removed and a core sample sent for pathologic
analysis.

Through the working cannula, a Kyphon bone biopsy device was
advanced to within 5 mm of the anterior aspect of L3. A core sample
from this was also sent for pathologic analysis.

Through the working cannula, a Kyphon inflatable bone tamp 20 x 3
was advanced and positioned with the distal marker 5 mm from the
anterior aspect of L3. Crossing of the midline was seen on the AP
projection. At this time, the balloon was expanded using contrast
via a Kyphon inflation syringe device via microtubing.

Inflations were continued until there was apposition with the
superior endplate.

At this time, methylmethacrylate mixture was reconstituted with
Tobramycin in the Kyphon bone mixing device system. This was then
loaded onto the Kyphon bone fillers.

The balloon was deflated and removed followed by the instillation of
5 bone filler equivalents of methylmethacrylate mixture at L3 with
excellent filling in the AP and lateral projections. No
extravasation was noted in the disk spaces or posteriorly into the
spinal canal. No epidural venous contamination was seen.

The working cannula and the bone filler were then retrieved and
removed. Hemostasis was achieved at the skin entry site.

The patient was then transferred back to [HOSPITAL].
IMPRESSION: 1. Status post fluoroscopic-guided needle placement for deep core
bone biopsy at L3. The report of the biopsy is to be sent to
patient's referring doctors, Shaikh, Ros and Gam
[place], [place].

2. Status post vertebral body augmentation using balloon kyphoplasty
at L3 as described without event.

## 2018-09-25 DIAGNOSIS — M79675 Pain in left toe(s): Secondary | ICD-10-CM | POA: Diagnosis not present

## 2018-09-25 DIAGNOSIS — M79674 Pain in right toe(s): Secondary | ICD-10-CM | POA: Diagnosis not present

## 2018-09-25 DIAGNOSIS — B351 Tinea unguium: Secondary | ICD-10-CM | POA: Diagnosis not present

## 2018-10-01 DIAGNOSIS — K219 Gastro-esophageal reflux disease without esophagitis: Secondary | ICD-10-CM | POA: Diagnosis not present

## 2018-10-01 DIAGNOSIS — N3281 Overactive bladder: Secondary | ICD-10-CM | POA: Diagnosis not present

## 2018-10-01 DIAGNOSIS — I10 Essential (primary) hypertension: Secondary | ICD-10-CM | POA: Diagnosis not present

## 2018-10-15 DIAGNOSIS — F5109 Other insomnia not due to a substance or known physiological condition: Secondary | ICD-10-CM | POA: Diagnosis not present

## 2018-10-15 DIAGNOSIS — G3184 Mild cognitive impairment, so stated: Secondary | ICD-10-CM | POA: Diagnosis not present

## 2018-10-16 DIAGNOSIS — I10 Essential (primary) hypertension: Secondary | ICD-10-CM | POA: Diagnosis not present

## 2018-10-16 DIAGNOSIS — G4700 Insomnia, unspecified: Secondary | ICD-10-CM | POA: Diagnosis not present

## 2018-10-22 DIAGNOSIS — H04123 Dry eye syndrome of bilateral lacrimal glands: Secondary | ICD-10-CM | POA: Diagnosis not present

## 2018-10-22 DIAGNOSIS — G9009 Other idiopathic peripheral autonomic neuropathy: Secondary | ICD-10-CM | POA: Diagnosis not present

## 2018-11-12 DIAGNOSIS — Z03818 Encounter for observation for suspected exposure to other biological agents ruled out: Secondary | ICD-10-CM | POA: Diagnosis not present

## 2018-11-14 DIAGNOSIS — R609 Edema, unspecified: Secondary | ICD-10-CM | POA: Diagnosis not present

## 2018-11-14 DIAGNOSIS — N3281 Overactive bladder: Secondary | ICD-10-CM | POA: Diagnosis not present

## 2018-11-14 DIAGNOSIS — G9009 Other idiopathic peripheral autonomic neuropathy: Secondary | ICD-10-CM | POA: Diagnosis not present

## 2018-11-14 DIAGNOSIS — M545 Low back pain: Secondary | ICD-10-CM | POA: Diagnosis not present

## 2018-11-14 DIAGNOSIS — I10 Essential (primary) hypertension: Secondary | ICD-10-CM | POA: Diagnosis not present

## 2018-11-14 DIAGNOSIS — H04123 Dry eye syndrome of bilateral lacrimal glands: Secondary | ICD-10-CM | POA: Diagnosis not present

## 2018-11-14 DIAGNOSIS — K5900 Constipation, unspecified: Secondary | ICD-10-CM | POA: Diagnosis not present

## 2018-11-14 DIAGNOSIS — G3184 Mild cognitive impairment, so stated: Secondary | ICD-10-CM | POA: Diagnosis not present

## 2018-11-14 DIAGNOSIS — F5109 Other insomnia not due to a substance or known physiological condition: Secondary | ICD-10-CM | POA: Diagnosis not present

## 2018-11-14 DIAGNOSIS — G4700 Insomnia, unspecified: Secondary | ICD-10-CM | POA: Diagnosis not present

## 2018-11-14 DIAGNOSIS — H4010X4 Unspecified open-angle glaucoma, indeterminate stage: Secondary | ICD-10-CM | POA: Diagnosis not present

## 2018-11-14 DIAGNOSIS — E876 Hypokalemia: Secondary | ICD-10-CM | POA: Diagnosis not present

## 2018-11-14 DIAGNOSIS — K219 Gastro-esophageal reflux disease without esophagitis: Secondary | ICD-10-CM | POA: Diagnosis not present

## 2018-11-15 DIAGNOSIS — I10 Essential (primary) hypertension: Secondary | ICD-10-CM | POA: Diagnosis not present

## 2018-11-15 DIAGNOSIS — G4700 Insomnia, unspecified: Secondary | ICD-10-CM | POA: Diagnosis not present

## 2018-11-19 DIAGNOSIS — I10 Essential (primary) hypertension: Secondary | ICD-10-CM | POA: Diagnosis not present

## 2018-11-19 DIAGNOSIS — R609 Edema, unspecified: Secondary | ICD-10-CM | POA: Diagnosis not present

## 2018-11-19 DIAGNOSIS — K5900 Constipation, unspecified: Secondary | ICD-10-CM | POA: Diagnosis not present

## 2018-12-11 DIAGNOSIS — M79674 Pain in right toe(s): Secondary | ICD-10-CM | POA: Diagnosis not present

## 2018-12-11 DIAGNOSIS — M79675 Pain in left toe(s): Secondary | ICD-10-CM | POA: Diagnosis not present

## 2018-12-11 DIAGNOSIS — L600 Ingrowing nail: Secondary | ICD-10-CM | POA: Diagnosis not present

## 2018-12-15 DIAGNOSIS — F0150 Vascular dementia without behavioral disturbance: Secondary | ICD-10-CM | POA: Diagnosis not present

## 2018-12-15 DIAGNOSIS — G4701 Insomnia due to medical condition: Secondary | ICD-10-CM | POA: Diagnosis not present

## 2018-12-17 DIAGNOSIS — H4010X4 Unspecified open-angle glaucoma, indeterminate stage: Secondary | ICD-10-CM | POA: Diagnosis not present

## 2018-12-17 DIAGNOSIS — I10 Essential (primary) hypertension: Secondary | ICD-10-CM | POA: Diagnosis not present

## 2019-01-07 DIAGNOSIS — R350 Frequency of micturition: Secondary | ICD-10-CM | POA: Diagnosis not present

## 2019-01-07 DIAGNOSIS — R8290 Unspecified abnormal findings in urine: Secondary | ICD-10-CM | POA: Diagnosis not present

## 2019-01-12 DIAGNOSIS — F0150 Vascular dementia without behavioral disturbance: Secondary | ICD-10-CM | POA: Diagnosis not present

## 2019-01-14 DIAGNOSIS — I10 Essential (primary) hypertension: Secondary | ICD-10-CM | POA: Diagnosis not present

## 2019-01-14 DIAGNOSIS — K5900 Constipation, unspecified: Secondary | ICD-10-CM | POA: Diagnosis not present

## 2019-01-14 DIAGNOSIS — N390 Urinary tract infection, site not specified: Secondary | ICD-10-CM | POA: Diagnosis not present

## 2019-01-16 ENCOUNTER — Other Ambulatory Visit: Payer: Self-pay

## 2019-01-24 DIAGNOSIS — G4701 Insomnia due to medical condition: Secondary | ICD-10-CM | POA: Diagnosis not present

## 2019-01-24 DIAGNOSIS — F0150 Vascular dementia without behavioral disturbance: Secondary | ICD-10-CM | POA: Diagnosis not present

## 2019-01-27 DIAGNOSIS — N390 Urinary tract infection, site not specified: Secondary | ICD-10-CM | POA: Diagnosis not present

## 2019-02-09 DIAGNOSIS — F0150 Vascular dementia without behavioral disturbance: Secondary | ICD-10-CM | POA: Diagnosis not present

## 2019-02-12 DIAGNOSIS — N3281 Overactive bladder: Secondary | ICD-10-CM | POA: Diagnosis not present

## 2019-02-12 DIAGNOSIS — I10 Essential (primary) hypertension: Secondary | ICD-10-CM | POA: Diagnosis not present

## 2019-02-12 DIAGNOSIS — E876 Hypokalemia: Secondary | ICD-10-CM | POA: Diagnosis not present

## 2019-02-12 DIAGNOSIS — G894 Chronic pain syndrome: Secondary | ICD-10-CM | POA: Diagnosis not present

## 2019-02-18 DIAGNOSIS — L600 Ingrowing nail: Secondary | ICD-10-CM | POA: Diagnosis not present

## 2019-02-18 DIAGNOSIS — M79675 Pain in left toe(s): Secondary | ICD-10-CM | POA: Diagnosis not present

## 2019-02-18 DIAGNOSIS — M79674 Pain in right toe(s): Secondary | ICD-10-CM | POA: Diagnosis not present

## 2019-02-19 DIAGNOSIS — N189 Chronic kidney disease, unspecified: Secondary | ICD-10-CM | POA: Diagnosis not present

## 2019-02-19 DIAGNOSIS — I10 Essential (primary) hypertension: Secondary | ICD-10-CM | POA: Diagnosis not present

## 2019-02-19 DIAGNOSIS — K5904 Chronic idiopathic constipation: Secondary | ICD-10-CM | POA: Diagnosis not present

## 2019-03-09 DIAGNOSIS — F0150 Vascular dementia without behavioral disturbance: Secondary | ICD-10-CM | POA: Diagnosis not present

## 2019-03-12 DIAGNOSIS — K5900 Constipation, unspecified: Secondary | ICD-10-CM | POA: Diagnosis not present

## 2019-03-12 DIAGNOSIS — G894 Chronic pain syndrome: Secondary | ICD-10-CM | POA: Diagnosis not present

## 2019-03-12 DIAGNOSIS — I10 Essential (primary) hypertension: Secondary | ICD-10-CM | POA: Diagnosis not present

## 2019-03-12 DIAGNOSIS — K219 Gastro-esophageal reflux disease without esophagitis: Secondary | ICD-10-CM | POA: Diagnosis not present

## 2019-03-23 DIAGNOSIS — Z23 Encounter for immunization: Secondary | ICD-10-CM | POA: Diagnosis not present

## 2019-03-25 DIAGNOSIS — Z03818 Encounter for observation for suspected exposure to other biological agents ruled out: Secondary | ICD-10-CM | POA: Diagnosis not present

## 2019-04-01 DIAGNOSIS — Z03818 Encounter for observation for suspected exposure to other biological agents ruled out: Secondary | ICD-10-CM | POA: Diagnosis not present

## 2019-04-09 DIAGNOSIS — K5900 Constipation, unspecified: Secondary | ICD-10-CM | POA: Diagnosis not present

## 2019-04-09 DIAGNOSIS — N3281 Overactive bladder: Secondary | ICD-10-CM | POA: Diagnosis not present

## 2019-04-09 DIAGNOSIS — I10 Essential (primary) hypertension: Secondary | ICD-10-CM | POA: Diagnosis not present

## 2019-04-09 DIAGNOSIS — F0150 Vascular dementia without behavioral disturbance: Secondary | ICD-10-CM | POA: Diagnosis not present

## 2019-04-16 DIAGNOSIS — Z03818 Encounter for observation for suspected exposure to other biological agents ruled out: Secondary | ICD-10-CM | POA: Diagnosis not present

## 2019-04-22 DIAGNOSIS — Z03818 Encounter for observation for suspected exposure to other biological agents ruled out: Secondary | ICD-10-CM | POA: Diagnosis not present

## 2019-04-28 DIAGNOSIS — L600 Ingrowing nail: Secondary | ICD-10-CM | POA: Diagnosis not present

## 2019-04-28 DIAGNOSIS — M79674 Pain in right toe(s): Secondary | ICD-10-CM | POA: Diagnosis not present

## 2019-04-28 DIAGNOSIS — M79675 Pain in left toe(s): Secondary | ICD-10-CM | POA: Diagnosis not present

## 2019-04-29 DIAGNOSIS — Z03818 Encounter for observation for suspected exposure to other biological agents ruled out: Secondary | ICD-10-CM | POA: Diagnosis not present

## 2019-05-05 DIAGNOSIS — I10 Essential (primary) hypertension: Secondary | ICD-10-CM | POA: Diagnosis not present

## 2019-05-05 DIAGNOSIS — G9009 Other idiopathic peripheral autonomic neuropathy: Secondary | ICD-10-CM | POA: Diagnosis not present

## 2019-05-05 DIAGNOSIS — K219 Gastro-esophageal reflux disease without esophagitis: Secondary | ICD-10-CM | POA: Diagnosis not present

## 2019-05-07 DIAGNOSIS — F0150 Vascular dementia without behavioral disturbance: Secondary | ICD-10-CM | POA: Diagnosis not present

## 2019-05-07 DIAGNOSIS — H4010X0 Unspecified open-angle glaucoma, stage unspecified: Secondary | ICD-10-CM | POA: Diagnosis not present

## 2019-05-07 DIAGNOSIS — G9009 Other idiopathic peripheral autonomic neuropathy: Secondary | ICD-10-CM | POA: Diagnosis not present

## 2019-05-21 DIAGNOSIS — F0150 Vascular dementia without behavioral disturbance: Secondary | ICD-10-CM | POA: Diagnosis not present

## 2019-06-18 DIAGNOSIS — G9009 Other idiopathic peripheral autonomic neuropathy: Secondary | ICD-10-CM | POA: Diagnosis not present

## 2019-06-18 DIAGNOSIS — H4010X0 Unspecified open-angle glaucoma, stage unspecified: Secondary | ICD-10-CM | POA: Diagnosis not present

## 2019-06-23 DIAGNOSIS — Z23 Encounter for immunization: Secondary | ICD-10-CM | POA: Diagnosis not present

## 2019-07-01 DIAGNOSIS — Z0000 Encounter for general adult medical examination without abnormal findings: Secondary | ICD-10-CM | POA: Diagnosis not present

## 2019-07-09 DIAGNOSIS — K5909 Other constipation: Secondary | ICD-10-CM | POA: Diagnosis not present

## 2019-07-09 DIAGNOSIS — H4010X0 Unspecified open-angle glaucoma, stage unspecified: Secondary | ICD-10-CM | POA: Diagnosis not present

## 2019-07-09 DIAGNOSIS — F0150 Vascular dementia without behavioral disturbance: Secondary | ICD-10-CM | POA: Diagnosis not present

## 2019-07-09 DIAGNOSIS — G9009 Other idiopathic peripheral autonomic neuropathy: Secondary | ICD-10-CM | POA: Diagnosis not present

## 2019-07-09 DIAGNOSIS — H3530 Unspecified macular degeneration: Secondary | ICD-10-CM | POA: Diagnosis not present

## 2019-07-10 DIAGNOSIS — I10 Essential (primary) hypertension: Secondary | ICD-10-CM | POA: Diagnosis not present

## 2019-07-10 DIAGNOSIS — K219 Gastro-esophageal reflux disease without esophagitis: Secondary | ICD-10-CM | POA: Diagnosis not present

## 2019-07-10 DIAGNOSIS — G9009 Other idiopathic peripheral autonomic neuropathy: Secondary | ICD-10-CM | POA: Diagnosis not present

## 2019-07-21 DIAGNOSIS — Z23 Encounter for immunization: Secondary | ICD-10-CM | POA: Diagnosis not present

## 2019-07-23 DIAGNOSIS — F0150 Vascular dementia without behavioral disturbance: Secondary | ICD-10-CM | POA: Diagnosis not present

## 2019-07-30 DIAGNOSIS — F0150 Vascular dementia without behavioral disturbance: Secondary | ICD-10-CM | POA: Diagnosis not present

## 2019-07-31 DIAGNOSIS — M545 Low back pain: Secondary | ICD-10-CM | POA: Diagnosis not present

## 2019-07-31 DIAGNOSIS — F341 Dysthymic disorder: Secondary | ICD-10-CM | POA: Diagnosis not present

## 2019-07-31 DIAGNOSIS — K219 Gastro-esophageal reflux disease without esophagitis: Secondary | ICD-10-CM | POA: Diagnosis not present

## 2019-07-31 DIAGNOSIS — I10 Essential (primary) hypertension: Secondary | ICD-10-CM | POA: Diagnosis not present

## 2019-07-31 DIAGNOSIS — F339 Major depressive disorder, recurrent, unspecified: Secondary | ICD-10-CM | POA: Diagnosis not present

## 2019-08-12 DIAGNOSIS — M79674 Pain in right toe(s): Secondary | ICD-10-CM | POA: Diagnosis not present

## 2019-08-12 DIAGNOSIS — M79675 Pain in left toe(s): Secondary | ICD-10-CM | POA: Diagnosis not present

## 2019-08-12 DIAGNOSIS — L600 Ingrowing nail: Secondary | ICD-10-CM | POA: Diagnosis not present

## 2019-08-24 DIAGNOSIS — G9009 Other idiopathic peripheral autonomic neuropathy: Secondary | ICD-10-CM | POA: Diagnosis not present

## 2019-08-24 DIAGNOSIS — K5909 Other constipation: Secondary | ICD-10-CM | POA: Diagnosis not present

## 2019-08-24 DIAGNOSIS — N189 Chronic kidney disease, unspecified: Secondary | ICD-10-CM | POA: Diagnosis not present

## 2019-08-28 DIAGNOSIS — G4700 Insomnia, unspecified: Secondary | ICD-10-CM | POA: Diagnosis not present

## 2019-08-28 DIAGNOSIS — F339 Major depressive disorder, recurrent, unspecified: Secondary | ICD-10-CM | POA: Diagnosis not present

## 2019-09-02 DIAGNOSIS — I10 Essential (primary) hypertension: Secondary | ICD-10-CM | POA: Diagnosis not present

## 2019-09-23 DIAGNOSIS — I10 Essential (primary) hypertension: Secondary | ICD-10-CM | POA: Diagnosis not present

## 2019-09-23 DIAGNOSIS — K219 Gastro-esophageal reflux disease without esophagitis: Secondary | ICD-10-CM | POA: Diagnosis not present

## 2019-09-23 DIAGNOSIS — H04123 Dry eye syndrome of bilateral lacrimal glands: Secondary | ICD-10-CM | POA: Diagnosis not present

## 2019-09-25 DIAGNOSIS — G4700 Insomnia, unspecified: Secondary | ICD-10-CM | POA: Diagnosis not present

## 2019-09-25 DIAGNOSIS — F339 Major depressive disorder, recurrent, unspecified: Secondary | ICD-10-CM | POA: Diagnosis not present

## 2019-09-25 DIAGNOSIS — F0150 Vascular dementia without behavioral disturbance: Secondary | ICD-10-CM | POA: Diagnosis not present

## 2019-10-19 DIAGNOSIS — F0151 Vascular dementia with behavioral disturbance: Secondary | ICD-10-CM | POA: Diagnosis not present

## 2019-10-19 DIAGNOSIS — G4700 Insomnia, unspecified: Secondary | ICD-10-CM | POA: Diagnosis not present

## 2019-10-19 DIAGNOSIS — F339 Major depressive disorder, recurrent, unspecified: Secondary | ICD-10-CM | POA: Diagnosis not present

## 2019-10-20 DIAGNOSIS — B351 Tinea unguium: Secondary | ICD-10-CM | POA: Diagnosis not present

## 2019-10-20 DIAGNOSIS — M79674 Pain in right toe(s): Secondary | ICD-10-CM | POA: Diagnosis not present

## 2019-10-21 DIAGNOSIS — G4700 Insomnia, unspecified: Secondary | ICD-10-CM | POA: Diagnosis not present

## 2019-10-21 DIAGNOSIS — N189 Chronic kidney disease, unspecified: Secondary | ICD-10-CM | POA: Diagnosis not present

## 2019-10-21 DIAGNOSIS — F339 Major depressive disorder, recurrent, unspecified: Secondary | ICD-10-CM | POA: Diagnosis not present

## 2019-10-21 DIAGNOSIS — F0151 Vascular dementia with behavioral disturbance: Secondary | ICD-10-CM | POA: Diagnosis not present

## 2019-10-21 DIAGNOSIS — M545 Low back pain: Secondary | ICD-10-CM | POA: Diagnosis not present

## 2019-10-21 DIAGNOSIS — G894 Chronic pain syndrome: Secondary | ICD-10-CM | POA: Diagnosis not present

## 2019-10-21 DIAGNOSIS — L239 Allergic contact dermatitis, unspecified cause: Secondary | ICD-10-CM | POA: Diagnosis not present

## 2019-10-21 DIAGNOSIS — L03211 Cellulitis of face: Secondary | ICD-10-CM | POA: Diagnosis not present

## 2019-11-02 DIAGNOSIS — F339 Major depressive disorder, recurrent, unspecified: Secondary | ICD-10-CM | POA: Diagnosis not present

## 2019-11-18 DIAGNOSIS — E876 Hypokalemia: Secondary | ICD-10-CM | POA: Diagnosis not present

## 2019-11-18 DIAGNOSIS — G4700 Insomnia, unspecified: Secondary | ICD-10-CM | POA: Diagnosis not present

## 2019-11-18 DIAGNOSIS — G9009 Other idiopathic peripheral autonomic neuropathy: Secondary | ICD-10-CM | POA: Diagnosis not present

## 2019-12-16 DIAGNOSIS — G894 Chronic pain syndrome: Secondary | ICD-10-CM | POA: Diagnosis not present

## 2019-12-16 DIAGNOSIS — F339 Major depressive disorder, recurrent, unspecified: Secondary | ICD-10-CM | POA: Diagnosis not present

## 2019-12-16 DIAGNOSIS — G4700 Insomnia, unspecified: Secondary | ICD-10-CM | POA: Diagnosis not present

## 2019-12-16 DIAGNOSIS — F0151 Vascular dementia with behavioral disturbance: Secondary | ICD-10-CM | POA: Diagnosis not present

## 2019-12-29 DIAGNOSIS — L600 Ingrowing nail: Secondary | ICD-10-CM | POA: Diagnosis not present

## 2019-12-29 DIAGNOSIS — M79675 Pain in left toe(s): Secondary | ICD-10-CM | POA: Diagnosis not present

## 2019-12-29 DIAGNOSIS — M79674 Pain in right toe(s): Secondary | ICD-10-CM | POA: Diagnosis not present

## 2020-01-11 ENCOUNTER — Other Ambulatory Visit (HOSPITAL_COMMUNITY)
Admission: AD | Admit: 2020-01-11 | Discharge: 2020-01-11 | Disposition: A | Discharge status: Home / Self Care | Payer: Medicare Other | Source: Skilled Nursing Facility | Attending: Family Medicine | Admitting: Family Medicine

## 2020-01-11 DIAGNOSIS — N390 Urinary tract infection, site not specified: Secondary | ICD-10-CM | POA: Diagnosis not present

## 2020-01-11 LAB — URINALYSIS, ROUTINE W REFLEX MICROSCOPIC
Bilirubin Urine: NEGATIVE
Glucose, UA: NEGATIVE mg/dL
Hgb urine dipstick: NEGATIVE
Ketones, ur: NEGATIVE mg/dL
Leukocytes,Ua: NEGATIVE
Nitrite: NEGATIVE
Protein, ur: NEGATIVE mg/dL
Specific Gravity, Urine: 1.004 — ABNORMAL LOW (ref 1.005–1.030)
pH: 6 (ref 5.0–8.0)

## 2020-01-13 DIAGNOSIS — I10 Essential (primary) hypertension: Secondary | ICD-10-CM | POA: Diagnosis not present

## 2020-01-13 DIAGNOSIS — M545 Low back pain: Secondary | ICD-10-CM | POA: Diagnosis not present

## 2020-01-13 LAB — URINE CULTURE: Culture: 30000 — AB

## 2020-01-20 DIAGNOSIS — M545 Low back pain: Secondary | ICD-10-CM | POA: Diagnosis not present

## 2020-01-20 DIAGNOSIS — F0151 Vascular dementia with behavioral disturbance: Secondary | ICD-10-CM | POA: Diagnosis not present

## 2020-01-20 DIAGNOSIS — G4700 Insomnia, unspecified: Secondary | ICD-10-CM | POA: Diagnosis not present

## 2020-01-20 DIAGNOSIS — F339 Major depressive disorder, recurrent, unspecified: Secondary | ICD-10-CM | POA: Diagnosis not present

## 2020-01-20 DIAGNOSIS — G894 Chronic pain syndrome: Secondary | ICD-10-CM | POA: Diagnosis not present

## 2020-02-09 DIAGNOSIS — K5900 Constipation, unspecified: Secondary | ICD-10-CM | POA: Diagnosis not present

## 2020-02-09 DIAGNOSIS — N3281 Overactive bladder: Secondary | ICD-10-CM | POA: Diagnosis not present

## 2020-02-09 DIAGNOSIS — I10 Essential (primary) hypertension: Secondary | ICD-10-CM | POA: Diagnosis not present

## 2020-03-16 DIAGNOSIS — Z23 Encounter for immunization: Secondary | ICD-10-CM | POA: Diagnosis not present

## 2020-04-06 DIAGNOSIS — E876 Hypokalemia: Secondary | ICD-10-CM | POA: Diagnosis not present

## 2020-04-06 DIAGNOSIS — G9009 Other idiopathic peripheral autonomic neuropathy: Secondary | ICD-10-CM | POA: Diagnosis not present

## 2020-04-06 DIAGNOSIS — R600 Localized edema: Secondary | ICD-10-CM | POA: Diagnosis not present

## 2020-04-22 DIAGNOSIS — Z23 Encounter for immunization: Secondary | ICD-10-CM | POA: Diagnosis not present

## 2020-04-26 ENCOUNTER — Other Ambulatory Visit (HOSPITAL_COMMUNITY)
Admission: RE | Admit: 2020-04-26 | Discharge: 2020-04-26 | Disposition: A | Discharge status: Home / Self Care | Payer: Medicare Other | Source: Other Acute Inpatient Hospital | Attending: Family Medicine | Admitting: Family Medicine

## 2020-04-26 DIAGNOSIS — N390 Urinary tract infection, site not specified: Secondary | ICD-10-CM | POA: Insufficient documentation

## 2020-04-26 LAB — URINALYSIS, ROUTINE W REFLEX MICROSCOPIC
Bacteria, UA: NONE SEEN
Bilirubin Urine: NEGATIVE
Glucose, UA: NEGATIVE mg/dL
Hgb urine dipstick: NEGATIVE
Ketones, ur: NEGATIVE mg/dL
Nitrite: NEGATIVE
Protein, ur: 30 mg/dL — AB
Specific Gravity, Urine: 1.009 (ref 1.005–1.030)
pH: 6 (ref 5.0–8.0)

## 2020-04-28 LAB — URINE CULTURE

## 2020-05-25 DIAGNOSIS — M545 Low back pain, unspecified: Secondary | ICD-10-CM | POA: Diagnosis not present

## 2020-05-25 DIAGNOSIS — F0151 Vascular dementia with behavioral disturbance: Secondary | ICD-10-CM | POA: Diagnosis not present

## 2020-05-25 DIAGNOSIS — K5900 Constipation, unspecified: Secondary | ICD-10-CM | POA: Diagnosis not present

## 2020-05-25 DIAGNOSIS — I10 Essential (primary) hypertension: Secondary | ICD-10-CM | POA: Diagnosis not present

## 2020-05-25 DIAGNOSIS — R634 Abnormal weight loss: Secondary | ICD-10-CM | POA: Diagnosis not present

## 2020-05-25 DIAGNOSIS — R600 Localized edema: Secondary | ICD-10-CM | POA: Diagnosis not present

## 2020-05-25 DIAGNOSIS — E876 Hypokalemia: Secondary | ICD-10-CM | POA: Diagnosis not present

## 2020-05-25 DIAGNOSIS — K219 Gastro-esophageal reflux disease without esophagitis: Secondary | ICD-10-CM | POA: Diagnosis not present

## 2020-05-25 DIAGNOSIS — N3281 Overactive bladder: Secondary | ICD-10-CM | POA: Diagnosis not present

## 2020-05-25 DIAGNOSIS — N189 Chronic kidney disease, unspecified: Secondary | ICD-10-CM | POA: Diagnosis not present

## 2020-05-25 DIAGNOSIS — G9009 Other idiopathic peripheral autonomic neuropathy: Secondary | ICD-10-CM | POA: Diagnosis not present

## 2020-05-31 DIAGNOSIS — L600 Ingrowing nail: Secondary | ICD-10-CM | POA: Diagnosis not present

## 2020-05-31 DIAGNOSIS — M79675 Pain in left toe(s): Secondary | ICD-10-CM | POA: Diagnosis not present

## 2020-05-31 DIAGNOSIS — M79674 Pain in right toe(s): Secondary | ICD-10-CM | POA: Diagnosis not present

## 2020-06-03 DIAGNOSIS — R7309 Other abnormal glucose: Secondary | ICD-10-CM | POA: Diagnosis not present

## 2020-06-03 DIAGNOSIS — I10 Essential (primary) hypertension: Secondary | ICD-10-CM | POA: Diagnosis not present

## 2020-06-03 DIAGNOSIS — E876 Hypokalemia: Secondary | ICD-10-CM | POA: Diagnosis not present

## 2020-06-03 DIAGNOSIS — D519 Vitamin B12 deficiency anemia, unspecified: Secondary | ICD-10-CM | POA: Diagnosis not present

## 2020-06-13 DIAGNOSIS — E559 Vitamin D deficiency, unspecified: Secondary | ICD-10-CM | POA: Diagnosis not present

## 2020-06-13 DIAGNOSIS — R634 Abnormal weight loss: Secondary | ICD-10-CM | POA: Diagnosis not present

## 2020-06-13 DIAGNOSIS — N184 Chronic kidney disease, stage 4 (severe): Secondary | ICD-10-CM | POA: Diagnosis not present

## 2020-06-13 DIAGNOSIS — E876 Hypokalemia: Secondary | ICD-10-CM | POA: Diagnosis not present

## 2020-11-30 ENCOUNTER — Other Ambulatory Visit: Payer: Self-pay

## 2020-11-30 ENCOUNTER — Emergency Department (HOSPITAL_COMMUNITY): Payer: Medicare Other

## 2020-11-30 ENCOUNTER — Inpatient Hospital Stay (HOSPITAL_COMMUNITY)
Admission: EM | Admit: 2020-11-30 | Discharge: 2020-12-02 | DRG: 177 | Disposition: A | Discharge status: Nursing Home (Includes ICF) | Payer: Medicare Other | Source: Skilled Nursing Facility | Attending: Family Medicine | Admitting: Family Medicine

## 2020-11-30 ENCOUNTER — Encounter (HOSPITAL_COMMUNITY): Payer: Self-pay

## 2020-11-30 DIAGNOSIS — Z96652 Presence of left artificial knee joint: Secondary | ICD-10-CM | POA: Diagnosis present

## 2020-11-30 DIAGNOSIS — N179 Acute kidney failure, unspecified: Secondary | ICD-10-CM | POA: Diagnosis present

## 2020-11-30 DIAGNOSIS — U071 COVID-19: Principal | ICD-10-CM | POA: Diagnosis present

## 2020-11-30 DIAGNOSIS — I129 Hypertensive chronic kidney disease with stage 1 through stage 4 chronic kidney disease, or unspecified chronic kidney disease: Secondary | ICD-10-CM | POA: Diagnosis present

## 2020-11-30 DIAGNOSIS — N183 Chronic kidney disease, stage 3 unspecified: Secondary | ICD-10-CM | POA: Diagnosis present

## 2020-11-30 DIAGNOSIS — R4182 Altered mental status, unspecified: Secondary | ICD-10-CM | POA: Diagnosis present

## 2020-11-30 DIAGNOSIS — F0390 Unspecified dementia without behavioral disturbance: Secondary | ICD-10-CM | POA: Diagnosis present

## 2020-11-30 DIAGNOSIS — Z886 Allergy status to analgesic agent status: Secondary | ICD-10-CM

## 2020-11-30 DIAGNOSIS — Z79891 Long term (current) use of opiate analgesic: Secondary | ICD-10-CM

## 2020-11-30 DIAGNOSIS — Z8249 Family history of ischemic heart disease and other diseases of the circulatory system: Secondary | ICD-10-CM

## 2020-11-30 DIAGNOSIS — Z66 Do not resuscitate: Secondary | ICD-10-CM | POA: Diagnosis present

## 2020-11-30 DIAGNOSIS — H409 Unspecified glaucoma: Secondary | ICD-10-CM | POA: Diagnosis present

## 2020-11-30 DIAGNOSIS — Z79899 Other long term (current) drug therapy: Secondary | ICD-10-CM

## 2020-11-30 DIAGNOSIS — Z8616 Personal history of COVID-19: Secondary | ICD-10-CM

## 2020-11-30 DIAGNOSIS — Z90710 Acquired absence of both cervix and uterus: Secondary | ICD-10-CM

## 2020-11-30 DIAGNOSIS — H3530 Unspecified macular degeneration: Secondary | ICD-10-CM | POA: Diagnosis present

## 2020-11-30 DIAGNOSIS — Z86718 Personal history of other venous thrombosis and embolism: Secondary | ICD-10-CM

## 2020-11-30 DIAGNOSIS — Z85828 Personal history of other malignant neoplasm of skin: Secondary | ICD-10-CM

## 2020-11-30 DIAGNOSIS — G9341 Metabolic encephalopathy: Secondary | ICD-10-CM | POA: Diagnosis not present

## 2020-11-30 DIAGNOSIS — I10 Essential (primary) hypertension: Secondary | ICD-10-CM | POA: Diagnosis present

## 2020-11-30 DIAGNOSIS — K922 Gastrointestinal hemorrhage, unspecified: Secondary | ICD-10-CM | POA: Diagnosis present

## 2020-11-30 DIAGNOSIS — D631 Anemia in chronic kidney disease: Secondary | ICD-10-CM | POA: Diagnosis present

## 2020-11-30 DIAGNOSIS — G9340 Encephalopathy, unspecified: Secondary | ICD-10-CM

## 2020-11-30 DIAGNOSIS — Z885 Allergy status to narcotic agent status: Secondary | ICD-10-CM

## 2020-11-30 DIAGNOSIS — H9190 Unspecified hearing loss, unspecified ear: Secondary | ICD-10-CM | POA: Diagnosis present

## 2020-11-30 DIAGNOSIS — N1832 Chronic kidney disease, stage 3b: Secondary | ICD-10-CM | POA: Diagnosis present

## 2020-11-30 LAB — CBC WITH DIFFERENTIAL/PLATELET
Abs Immature Granulocytes: 0.03 10*3/uL (ref 0.00–0.07)
Basophils Absolute: 0 10*3/uL (ref 0.0–0.1)
Basophils Relative: 0 %
Eosinophils Absolute: 0 10*3/uL (ref 0.0–0.5)
Eosinophils Relative: 1 %
HCT: 33.1 % — ABNORMAL LOW (ref 36.0–46.0)
Hemoglobin: 10.4 g/dL — ABNORMAL LOW (ref 12.0–15.0)
Immature Granulocytes: 0 %
Lymphocytes Relative: 30 %
Lymphs Abs: 2.1 10*3/uL (ref 0.7–4.0)
MCH: 32.3 pg (ref 26.0–34.0)
MCHC: 31.4 g/dL (ref 30.0–36.0)
MCV: 102.8 fL — ABNORMAL HIGH (ref 80.0–100.0)
Monocytes Absolute: 1.4 10*3/uL — ABNORMAL HIGH (ref 0.1–1.0)
Monocytes Relative: 21 %
Neutro Abs: 3.4 10*3/uL (ref 1.7–7.7)
Neutrophils Relative %: 48 %
Platelets: 277 10*3/uL (ref 150–400)
RBC: 3.22 MIL/uL — ABNORMAL LOW (ref 3.87–5.11)
RDW: 14.6 % (ref 11.5–15.5)
WBC: 6.9 10*3/uL (ref 4.0–10.5)
nRBC: 0 % (ref 0.0–0.2)

## 2020-11-30 LAB — COMPREHENSIVE METABOLIC PANEL
ALT: 14 U/L (ref 0–44)
AST: 22 U/L (ref 15–41)
Albumin: 3.9 g/dL (ref 3.5–5.0)
Alkaline Phosphatase: 63 U/L (ref 38–126)
Anion gap: 9 (ref 5–15)
BUN: 41 mg/dL — ABNORMAL HIGH (ref 8–23)
CO2: 22 mmol/L (ref 22–32)
Calcium: 8.6 mg/dL — ABNORMAL LOW (ref 8.9–10.3)
Chloride: 105 mmol/L (ref 98–111)
Creatinine, Ser: 1.6 mg/dL — ABNORMAL HIGH (ref 0.44–1.00)
GFR, Estimated: 30 mL/min — ABNORMAL LOW (ref >60)
Glucose, Bld: 141 mg/dL — ABNORMAL HIGH (ref 70–99)
Potassium: 4.8 mmol/L (ref 3.5–5.1)
Sodium: 136 mmol/L (ref 135–145)
Total Bilirubin: 0.4 mg/dL (ref 0.3–1.2)
Total Protein: 7 g/dL (ref 6.5–8.1)

## 2020-11-30 LAB — FERRITIN: Ferritin: 17 ng/mL (ref 11–307)

## 2020-11-30 LAB — D-DIMER, QUANTITATIVE: D-Dimer, Quant: 0.61 ug/mL-FEU — ABNORMAL HIGH (ref 0.00–0.50)

## 2020-11-30 LAB — URINALYSIS, ROUTINE W REFLEX MICROSCOPIC
Bacteria, UA: NONE SEEN
Bilirubin Urine: NEGATIVE
Glucose, UA: 50 mg/dL — AB
Ketones, ur: NEGATIVE mg/dL
Leukocytes,Ua: NEGATIVE
Nitrite: NEGATIVE
Protein, ur: 100 mg/dL — AB
Specific Gravity, Urine: 1.016 (ref 1.005–1.030)
pH: 6 (ref 5.0–8.0)

## 2020-11-30 LAB — RESP PANEL BY RT-PCR (FLU A&B, COVID) ARPGX2
Influenza A by PCR: NEGATIVE
Influenza B by PCR: NEGATIVE
SARS Coronavirus 2 by RT PCR: POSITIVE — AB

## 2020-11-30 LAB — LACTIC ACID, PLASMA
Lactic Acid, Venous: 1.3 mmol/L (ref 0.5–1.9)
Lactic Acid, Venous: 1.2 mmol/L (ref 0.5–1.9)

## 2020-11-30 LAB — LIPASE, BLOOD: Lipase: 35 U/L (ref 11–51)

## 2020-11-30 LAB — C-REACTIVE PROTEIN: CRP: 0.6 mg/dL (ref <1.0)

## 2020-11-30 LAB — TSH: TSH: 2.672 u[IU]/mL (ref 0.350–4.500)

## 2020-11-30 LAB — FIBRINOGEN: Fibrinogen: 416 mg/dL (ref 210–475)

## 2020-11-30 LAB — PROCALCITONIN: Procalcitonin: <0.1 ng/mL

## 2020-11-30 LAB — TRIGLYCERIDES: Triglycerides: 78 mg/dL (ref <150)

## 2020-11-30 LAB — LACTATE DEHYDROGENASE: LDH: 155 U/L (ref 98–192)

## 2020-11-30 MED ORDER — DORZOLAMIDE HCL-TIMOLOL MAL 2-0.5 % OP SOLN
1.0000 [drp] | Freq: Two times a day (BID) | OPHTHALMIC | Status: DC
  Administered 2020-11-30 – 2020-12-02 (×4): 1 [drp] via OPHTHALMIC
  Filled 2020-11-30: qty 10

## 2020-11-30 MED ORDER — ASCORBIC ACID 500 MG PO TABS
500.0000 mg | ORAL_TABLET | Freq: Every day | ORAL | Status: DC
  Administered 2020-11-30 – 2020-12-02 (×3): 500 mg via ORAL
  Filled 2020-11-30 (×3): qty 1

## 2020-11-30 MED ORDER — LOSARTAN POTASSIUM 25 MG PO TABS
12.5000 mg | ORAL_TABLET | Freq: Every day | ORAL | Status: DC
  Administered 2020-12-01: 12.5 mg via ORAL
  Filled 2020-11-30: qty 1

## 2020-11-30 MED ORDER — GUAIFENESIN-DM 100-10 MG/5ML PO SYRP: 10.0000 mL | ORAL_SOLUTION | ORAL | Status: DC | PRN

## 2020-11-30 MED ORDER — SODIUM CHLORIDE 0.9 % IV BOLUS
500.0000 mL | Freq: Once | INTRAVENOUS | Status: AC
  Administered 2020-11-30: 500 mL via INTRAVENOUS

## 2020-11-30 MED ORDER — DORZOLAMIDE HCL-TIMOLOL MAL 2-0.5 % OP SOLN
OPHTHALMIC | Status: AC
  Filled 2020-11-30: qty 10

## 2020-11-30 MED ORDER — SODIUM CHLORIDE 0.9 % IV SOLN
100.0000 mg | Freq: Every day | INTRAVENOUS | Status: AC
  Administered 2020-12-01 – 2020-12-02 (×2): 100 mg via INTRAVENOUS
  Filled 2020-11-30 (×2): qty 20

## 2020-11-30 MED ORDER — SERTRALINE HCL 50 MG PO TABS
50.0000 mg | ORAL_TABLET | Freq: Every day | ORAL | Status: DC
  Administered 2020-12-01 – 2020-12-02 (×2): 50 mg via ORAL
  Filled 2020-11-30 (×2): qty 1

## 2020-11-30 MED ORDER — SODIUM CHLORIDE 0.9 % IV SOLN: 100.0000 mg | Freq: Every day | INTRAVENOUS | Status: DC

## 2020-11-30 MED ORDER — LATANOPROST 0.005 % OP SOLN
1.0000 [drp] | Freq: Every day | OPHTHALMIC | Status: DC
  Administered 2020-11-30 – 2020-12-02 (×3): 1 [drp] via OPHTHALMIC
  Filled 2020-11-30 (×2): qty 2.5

## 2020-11-30 MED ORDER — ONDANSETRON HCL 4 MG/2ML IJ SOLN: 4.0000 mg | Freq: Four times a day (QID) | INTRAMUSCULAR | Status: DC | PRN

## 2020-11-30 MED ORDER — ONDANSETRON HCL 4 MG PO TABS: 4.0000 mg | ORAL_TABLET | Freq: Four times a day (QID) | ORAL | Status: DC | PRN

## 2020-11-30 MED ORDER — POLYETHYLENE GLYCOL 3350 17 G PO PACK: 17.0000 g | PACK | Freq: Every day | ORAL | Status: DC | PRN

## 2020-11-30 MED ORDER — MELATONIN 3 MG PO TABS
6.0000 mg | ORAL_TABLET | Freq: Every day | ORAL | Status: DC
  Administered 2020-11-30 – 2020-12-01 (×2): 6 mg via ORAL
  Filled 2020-11-30 (×2): qty 2

## 2020-11-30 MED ORDER — SODIUM CHLORIDE 0.9 % IV SOLN
100.0000 mg | INTRAVENOUS | Status: AC
Start: 2020-11-30 — End: 2020-11-30
  Administered 2020-11-30 (×2): 100 mg via INTRAVENOUS
  Filled 2020-11-30 (×2): qty 20

## 2020-11-30 MED ORDER — NIFEDIPINE ER OSMOTIC RELEASE 30 MG PO TB24
30.0000 mg | ORAL_TABLET | Freq: Every day | ORAL | Status: DC
  Administered 2020-12-01: 30 mg via ORAL
  Filled 2020-11-30: qty 1

## 2020-11-30 MED ORDER — SODIUM CHLORIDE 0.9 % IV SOLN: INTRAVENOUS | Status: AC

## 2020-11-30 MED ORDER — ZINC SULFATE 220 (50 ZN) MG PO CAPS
220.0000 mg | ORAL_CAPSULE | Freq: Every day | ORAL | Status: DC
  Administered 2020-11-30 – 2020-12-02 (×3): 220 mg via ORAL
  Filled 2020-11-30 (×3): qty 1

## 2020-11-30 MED ORDER — ACETAMINOPHEN 325 MG PO TABS: 650.0000 mg | ORAL_TABLET | Freq: Four times a day (QID) | ORAL | Status: DC | PRN

## 2020-11-30 MED ORDER — SENNA 8.6 MG PO TABS
8.6000 mg | ORAL_TABLET | Freq: Every day | ORAL | Status: DC
  Administered 2020-12-01 – 2020-12-02 (×2): 8.6 mg via ORAL
  Filled 2020-11-30 (×2): qty 1

## 2020-11-30 MED ORDER — HEPARIN SODIUM (PORCINE) 5000 UNIT/ML IJ SOLN
5000.0000 [IU] | Freq: Three times a day (TID) | INTRAMUSCULAR | Status: DC
  Administered 2020-11-30 – 2020-12-02 (×5): 5000 [IU] via SUBCUTANEOUS
  Filled 2020-11-30 (×5): qty 1

## 2020-11-30 MED ORDER — LABETALOL HCL 5 MG/ML IV SOLN
10.0000 mg | INTRAVENOUS | Status: DC | PRN
  Administered 2020-12-01 (×2): 10 mg via INTRAVENOUS
  Filled 2020-11-30: qty 4

## 2020-11-30 MED ORDER — NIRMATRELVIR/RITONAVIR (PAXLOVID) TABLET (RENAL DOSING): 2.0000 | ORAL_TABLET | Freq: Two times a day (BID) | ORAL | Status: DC

## 2020-11-30 MED ORDER — SODIUM CHLORIDE 0.9 % IV SOLN: 100.0000 mg | INTRAVENOUS | Status: DC

## 2020-11-30 NOTE — ED Triage Notes (Signed)
Pt sent to ED via [facility] for AMS. Staff noticed this morning pt was unable to ambulate to shower, put her back to chair, checked on her at 1400 and AMS is worse than this morning. Per staff, not had urine output in 2 days.

## 2020-11-30 NOTE — ED Notes (Signed)
ED TO INPATIENT HANDOFF REPORT  ED Nurse Name and Phone #:  [phone]  S Name/Age/Gender Cindy [place] 87 y.o. female Room/Bed: APA03/APA03  Code Status   Code Status: Prior  Home/SNF/Other Nursing Home Patient oriented to: self Is this baseline? No   Triage Complete: Triage complete  Chief Complaint AMS (altered mental status) [R41.82]  Triage Note Pt sent to ED via [facility] for AMS. Staff noticed this morning pt was unable to ambulate to shower, put her back to chair, checked on her at 1400 and AMS is worse than this morning. Per staff, not had urine output in 2 days.     Allergies Allergies  Allergen Reactions  . Codeine Nausea And Vomiting  . Aspirin     Cannot take due to Diverticulosis (bleeding side effects)  . Pepto-Bismol [Bismuth Subsalicylate]     Black stools  . Hydrocodone Other (See Comments)    Unknown Reaction     Level of Care/Admitting Diagnosis ED Disposition    ED Disposition  Admit   Condition  --   [place]: [facility] [[id number]]  Level of Care: Telemetry [5]  Covid Evaluation: Confirmed COVID Positive  Diagnosis: AMS (altered mental status) [[id number]]  Admitting Physician: [place] [[id number]]  Attending Physician: [place]indy [place]         B Medical/Surgery History Past Medical History:  Diagnosis Date  . Cancer [facility] [place]) March 2016   Skin Cancer :  Right Cheek     SCC  . Cancer [facility]) 2013   Skin Cancer  :  Anterior Neck   SCC  . Carotid artery occlusion   . Chronic kidney disease   . Diverticulitis   . DVT (deep venous thrombosis) ([place]) 07/14/10   LEA doppler   . GI bleeding    2013/2016/2018: suspected diverticular bleeding  . Glaucoma   . Hearing loss   . HTN (hypertension)    cholesterol  . Lumbar spine pain   . Lymphedema   . Macular degeneration   . Pain in joints   . Precancerous changes of the cervix    lesions  . Rectocele   . SOB (shortness of breath) 04/04/2010   2D  echo EF=>55%   Past Surgical History:  Procedure Laterality Date  . ABDOMINAL HYSTERECTOMY    . appendix    . bladder tac    . COLONOSCOPY  09/20/2011   [place]: pandiverticulosis, hemorrhoids. ascending colon polyp not removed in setting of active bleeding.  [place] COLONOSCOPY N/A 04/10/2017   Dr. [name]: three 4-6 mm polyps in ascending colon and cecum, s/p removal. tubular adenomas. diverticulosis in entire examined colon.   . corneal erosion     cataracts   . elevated metatarsal arch    . fallen uterus    . hemmorrhoidectomy    . IR KYPHO LUMBAR INC FX REDUCE BONE BX UNI/BIL CANNULATION INC/IMAGING  12/17/2017  . left total knee replacement  05/02/04   [place]  . met osteostomy    . morton's nueroma    . OVARIAN CYST SURGERY    . RECTOCELE REPAIR    . rupture rt groin    . salk    . SKIN CANCER EXCISION Right March 2016  2013   Cheek -  [facility] [place]  and  Anterior Neck  . tracheotomy       A IV Location/Drains/Wounds Patient Lines/Drains/Airways Status    Active Line/Drains/Airways    Name Placement date Placement time Site  Days   Peripheral IV 11/30/20 18 G Right Antecubital 11/30/20  1457  Antecubital  less than 1   Incision (Closed) 12/17/17 Back Mid 12/17/17  1143  -- 1079   Pressure Injury 12/30/17 Deep Tissue Injury - Purple or maroon localized area of discolored intact skin or blood-filled blister due to damage of underlying soft tissue from pressure and/or shear. 12/30/17  --  -- 1066          Intake/Output Last 24 hours No intake or output data in the 24 hours ending 11/30/20 1726  Labs/Imaging Results for orders placed or performed during the hospital encounter of 11/30/20 (from the past 48 hour(s))  Urinalysis, Routine w reflex microscopic Urine, Catheterized     Status: Abnormal   Collection Time: 11/30/20  3:11 PM  Result Value Ref Range   Color, Urine YELLOW YELLOW   APPearance CLEAR CLEAR   Specific Gravity, Urine 1.016 1.005 - 1.030   pH 6.0 5.0 - 8.0    Glucose, UA 50 (A) NEGATIVE mg/dL   Hgb urine dipstick SMALL (A) NEGATIVE   Bilirubin Urine NEGATIVE NEGATIVE   Ketones, ur NEGATIVE NEGATIVE mg/dL   Protein, ur 100 (A) NEGATIVE mg/dL   Nitrite NEGATIVE NEGATIVE   Leukocytes,Ua NEGATIVE NEGATIVE   RBC / HPF 0-5 0 - 5 RBC/hpf   WBC, UA 0-5 0 - 5 WBC/hpf   Bacteria, UA NONE SEEN NONE SEEN   Squamous Epithelial / LPF 0-5 0 - 5   Mucus PRESENT     Comment: Performed at [facility], [address] [place], [place] [address]  Resp Panel by RT-PCR (Flu A&B, Covid) Nasopharyngeal Swab     Status: Abnormal   Collection Time: 11/30/20  3:24 PM   Specimen: Nasopharyngeal Swab; Nasopharyngeal(NP) swabs in vial transport medium  Result Value Ref Range   SARS Coronavirus 2 by RT PCR POSITIVE (A) NEGATIVE    Comment: RESULT CALLED TO, READ BACK BY AND VERIFIED WITH: CRAWFORD,H ON 11/30/20 AT 1645 BY LOY,C (NOTE) SARS-CoV-2 target nucleic acids are DETECTED.  The SARS-CoV-2 RNA is generally detectable in upper respiratory specimens during the acute phase of infection. Positive results are indicative of the presence of the identified virus, but do not rule out bacterial infection or co-infection with other pathogens not detected by the test. Clinical correlation with patient history and other diagnostic information is necessary to determine patient infection status. The expected result is Negative.  Fact Sheet for Patients: EntrepreneurPulse.com.au  Fact Sheet for Healthcare Providers: IncredibleEmployment.be  This test is not yet approved or cleared by the [place] FDA and  has been authorized for detection and/or diagnosis of SARS-CoV-2 by FDA under an Emergency Use Authorization (EUA).  This EUA will remain in effect (meaning this test c an be used) for the duration of  the COVID-19 declaration under Section 564(b)(1) of the Act, 21 U.S.C. section 360bbb-3(b)(1), unless the authorization  is terminated or revoked sooner.     Influenza A by PCR NEGATIVE NEGATIVE   Influenza B by PCR NEGATIVE NEGATIVE    Comment: (NOTE) The Xpert Xpress SARS-CoV-2/FLU/RSV plus assay is intended as an aid in the diagnosis of influenza from Nasopharyngeal swab specimens and should not be used as a sole basis for treatment. Nasal washings and aspirates are unacceptable for Xpert Xpress SARS-CoV-2/FLU/RSV testing.  Fact Sheet for Patients: EntrepreneurPulse.com.au  Fact Sheet for Healthcare Providers: IncredibleEmployment.be  This test is not yet approved or cleared by the [place] and has been authorized  for detection and/or diagnosis of SARS-CoV-2 by FDA under an Emergency Use Authorization (EUA). This EUA will remain in effect (meaning this test can be used) for the duration of the COVID-19 declaration under Section 564(b)(1) of the Act, 21 U.S.C. section 360bbb-3(b)(1), unless the authorization is terminated or revoked.  Performed at [facility], [address], [place], [place] [address]   Comprehensive metabolic panel     Status: Abnormal   Collection Time: 11/30/20  3:25 PM  Result Value Ref Range   Sodium 136 135 - 145 mmol/L   Potassium 4.8 3.5 - 5.1 mmol/L   Chloride 105 98 - 111 mmol/L   CO2 22 22 - 32 mmol/L   Glucose, Bld 141 (H) 70 - 99 mg/dL    Comment: Glucose reference range applies only to samples taken after fasting for at least 8 hours.   BUN 41 (H) 8 - 23 mg/dL   Creatinine, Ser 1.60 (H) 0.44 - 1.00 mg/dL   Calcium 8.6 (L) 8.9 - 10.3 mg/dL   Total Protein 7.0 6.5 - 8.1 g/dL   Albumin 3.9 3.5 - 5.0 g/dL   AST 22 15 - 41 U/L   ALT 14 0 - 44 U/L   Alkaline Phosphatase 63 38 - 126 U/L   Total Bilirubin 0.4 0.3 - 1.2 mg/dL   GFR, Estimated 30 (L) >60 mL/min    Comment: (NOTE) Calculated using the CKD-EPI Creatinine Equation (2021)    Anion gap 9 5 - 15    Comment: Performed at [facility], [address],  [place], [place] [address]  Lipase, blood     Status: None   Collection Time: 11/30/20  3:25 PM  Result Value Ref Range   Lipase 35 11 - 51 U/L    Comment: Performed at [facility], [address], [place], [place] [address]  CBC with Differential     Status: Abnormal   Collection Time: 11/30/20  3:25 PM  Result Value Ref Range   WBC 6.9 4.0 - 10.5 K/uL   RBC 3.22 (L) 3.87 - 5.11 MIL/uL   Hemoglobin 10.4 (L) 12.0 - 15.0 g/dL   HCT 33.1 (L) 36.0 - 46.0 %   MCV 102.8 (H) 80.0 - 100.0 fL   MCH 32.3 26.0 - 34.0 pg   MCHC 31.4 30.0 - 36.0 g/dL   RDW 14.6 11.5 - 15.5 %   Platelets 277 150 - 400 K/uL   nRBC 0.0 0.0 - 0.2 %   Neutrophils Relative % 48 %   Neutro Abs 3.4 1.7 - 7.7 K/uL   Lymphocytes Relative 30 %   Lymphs Abs 2.1 0.7 - 4.0 K/uL   Monocytes Relative 21 %   Monocytes Absolute 1.4 (H) 0.1 - 1.0 K/uL   Eosinophils Relative 1 %   Eosinophils Absolute 0.0 0.0 - 0.5 K/uL   Basophils Relative 0 %   Basophils Absolute 0.0 0.0 - 0.1 K/uL   Immature Granulocytes 0 %   Abs Immature Granulocytes 0.03 0.00 - 0.07 K/uL    Comment: Performed at [facility], [address], [place], [place] [address]  TSH     Status: None   Collection Time: 11/30/20  3:25 PM  Result Value Ref Range   TSH 2.672 0.350 - 4.500 uIU/mL    Comment: Performed by a 3rd Generation assay with a functional sensitivity of <=0.01 uIU/mL. Performed at [facility], [address], [place],  [address]    CT Head Wo Contrast  Result Date: 11/30/2020 CLINICAL  DATA:  87 year old female with altered mental status. EXAM: CT HEAD WITHOUT CONTRAST TECHNIQUE: Contiguous axial images were obtained from the base of the skull through the vertex without intravenous contrast. COMPARISON:  Head CT dated 05/29/2011. FINDINGS: Brain: Mild age-related atrophy and chronic microvascular ischemic changes. There is no acute intracranial hemorrhage. No mass effect or midline shift. No extra-axial fluid collection. Vascular: No  hyperdense vessel or unexpected calcification. Skull: Normal. Negative for fracture or focal lesion. Sinuses/Orbits: There is complete opacification of the left maxillary sinus with chronic remodeling and destruction of the medial wall. Bilateral mastoid effusions. No air-fluid level. Other: None IMPRESSION: 1. No acute intracranial pathology. 2. Mild age-related atrophy and chronic microvascular ischemic changes. 3. Chronic left maxillary sinus disease. 4. Bilateral mastoid effusions. Electronically Signed   By: Anner Crete M.D.   On: 11/30/2020 16:20   DG Chest Portable 1 View  Result Date: 11/30/2020 CLINICAL DATA:  Altered mental status. EXAM: PORTABLE CHEST 1 VIEW COMPARISON:  Single-view of the chest 12/30/2017 and CT chest 12/31/2017. FINDINGS: Trace bilateral pleural effusions and mild basilar atelectasis. Heart size is normal. Aortic atherosclerosis. No pneumothorax. No acute or focal bony abnormality. IMPRESSION: Trace bilateral pleural effusions and mild bibasilar atelectasis. Aortic Atherosclerosis (ICD10-I70.0). Electronically Signed   By: Inge Rise M.D.   On: 11/30/2020 15:42    Pending Labs Unresulted Labs (From admission, onward)    Start     Ordered   11/30/20 1655  Lactic acid, plasma  Now then every 2 hours,   STAT      11/30/20 1655   11/30/20 1655  Blood Culture (routine x 2)  BLOOD CULTURE X 2,   STAT      11/30/20 1655   11/30/20 1655  D-dimer, quantitative  ONCE - STAT,   STAT       Comments: Used for prognosis and bed placement. Do not order CT or V/Q.    11/30/20 1655   11/30/20 1655  Procalcitonin  ONCE - STAT,   STAT        11/30/20 1655   11/30/20 1655  Lactate dehydrogenase  Once,   STAT        11/30/20 1655   11/30/20 1655  Ferritin  Once,   STAT        11/30/20 1655   11/30/20 1655  Triglycerides  (Triglyceride)  Once,   STAT        11/30/20 1655   11/30/20 1655  Fibrinogen  Once,   STAT        11/30/20 1655   11/30/20 1655  C-reactive protein   Once,   STAT        11/30/20 1655   11/30/20 1511  Urine culture  ONCE - STAT,   STAT        11/30/20 1512          Vitals/Pain Today's Vitals   11/30/20 1530 11/30/20 1600 11/30/20 1630 11/30/20 1700  BP: (!) 155/77 (!) 185/85 (!) 176/78 (!) 181/72  Pulse: 72 80 75 73  Resp: 15 (!) _0 Temp:      TempSrc:      SpO2: 99% 99% 99% 99%  Weight:      Height:      PainSc:        Isolation Precautions Airborne and Contact precautions  Medications Medications  remdesivir 100 mg in sodium chloride 0.9 % 100 mL IVPB (has no administration in time range)  remdesivir 100 mg in sodium  chloride 0.9 % 100 mL IVPB (has no administration in time range)  sodium chloride 0.9 % bolus 500 mL (0 mLs Intravenous Stopped 11/30/20 1658)    Mobility manual wheelchair High fall risk   Focused Assessments    R Recommendations: See Admitting Provider Note  Report given to:   Additional Notes:

## 2020-11-30 NOTE — ED Provider Notes (Signed)
Emergency Department Provider Note   I have reviewed the triage vital signs and the nursing notes.   HISTORY  Chief Complaint Altered Mental Status   HPI Cindy Schultz is a 87 y.o. female with past medical history reviewed below presents to the emergency department with increased confusion and generalized weakness.  Patient presents from nursing facility.  According to EMS, staff noticed the patient was unable to ambulate at the shower which is typically something she can do.  They let her rest in her chair through the morning and checked on her at 2 PM but noted that she seemed more confused.  Staff also reported no urine output in the past 2 days.   Patient is confused and hard of hearing which limits the HPI.  Level 5 caveat applies.    Past Medical History:  Diagnosis Date   Cancer [facility]) March 2016   Skin Cancer :  Right Cheek     SCC   Cancer [facility]) 2013   Skin Cancer  :  Anterior Neck   SCC   Carotid artery occlusion    Chronic kidney disease    Diverticulitis    DVT (deep venous thrombosis) ([place]) 07/14/10   LEA doppler    GI bleeding    2013/2016/2018: suspected diverticular bleeding   Glaucoma    Hearing loss    HTN (hypertension)    cholesterol   Lumbar spine pain    Lymphedema    Macular degeneration    Pain in joints    Precancerous changes of the cervix    lesions   Rectocele    SOB (shortness of breath) 04/04/2010   2D echo EF=>55%    Patient Active Problem List   Diagnosis Date Noted   AMS (altered mental status) 11/30/2020   Sepsis ([place]) 12/30/2017   Pressure injury of skin 12/30/2017   Cellulitis of left leg 12/30/2017   HCAP (healthcare-associated pneumonia) 12/30/2017   Glaucoma (increased eye pressure) 12/30/2017   Compression fracture of lumbar spine, non-traumatic, initial encounter ([place]) 12/17/2017   Back pain 12/12/2017   Abdominal pain 08/28/2017   Primary osteoarthritis of right hip 07/19/2017   Tear of right rotator cuff  07/19/2017   CKD (chronic kidney disease) stage 3, GFR 30-59 ml/min (HCC) 05/22/2017   Macrocytosis    Diverticulosis of large intestine with hemorrhage    Rectal bleed 05/19/2017   CKD (chronic kidney disease) stage 4, GFR 15-29 ml/min ([place]) 05/19/2017   LLQ pain 03/08/2017   Colon polyp 03/08/2017   Constipation 03/08/2017   Acute blood loss anemia 09/10/2014   GIB (gastrointestinal bleeding) 09/08/2014   Occlusion and stenosis of carotid artery without mention of cerebral infarction 12/14/2013   Osteoarthritis of left knee 04/23/2013   Thumb pain 10/22/2012   De Quervain's syndrome (tenosynovitis) 10/22/2012   CMC DJD(carpometacarpal degenerative joint disease), localized primary 10/22/2012   CTS (carpal tunnel syndrome) 10/22/2012   Trigger thumb of right hand 10/22/2012   Pes anserinus bursitis of right knee 06/26/2012   OA (osteoarthritis) of knee 06/26/2012   Leg pain, left 06/26/2012   Radicular pain of left lower extremity 06/26/2012   Diverticulosis of colon with hemorrhage 09/22/2011   Syncopal episodes 09/20/2011   GI bleed 09/19/2011   HTN (hypertension) 09/19/2011   Anemia due to blood loss, acute 09/19/2011   Abnormality of gait 01/03/2011   Personal history of fall 12/27/2010   DISLOCATION CLOSED SHOULDER NEC 04/25/2010   ANSERINE BURSITIS, LEFT 02/13/2010   KNEE PAIN  12/22/2009   LEG PAIN, BILATERAL 12/22/2009   TOTAL KNEE FOLLOW-UP 12/22/2009   HIP PAIN 02/07/2009   LOW BACK PAIN 02/07/2009    Past Surgical History:  Procedure Laterality Date   ABDOMINAL HYSTERECTOMY     appendix     bladder tac     COLONOSCOPY  09/20/2011   Dr. Carol Ada: pandiverticulosis, hemorrhoids. ascending colon polyp not removed in setting of active bleeding.   COLONOSCOPY N/A 04/10/2017   Dr. [name]: three 4-6 mm polyps in ascending colon and cecum, s/p removal. tubular adenomas. diverticulosis in entire examined colon.    corneal erosion     cataracts    elevated  metatarsal arch     fallen uterus     hemmorrhoidectomy     IR KYPHO LUMBAR INC FX REDUCE BONE BX UNI/BIL CANNULATION INC/IMAGING  12/17/2017   left total knee replacement  05/02/04   [facility]   met osteostomy     morton's nueroma     OVARIAN CYST SURGERY     RECTOCELE REPAIR     rupture rt groin     salk     SKIN CANCER EXCISION Right March 2016  2013   Cheek -  SCC  and  Anterior Neck   tracheotomy      Allergies Codeine, Aspirin, Pepto-bismol [bismuth subsalicylate], and Hydrocodone  Family History  Problem Relation Age of Onset   Stroke Mother    Diabetes Mother    Heart attack Father    COPD Paternal Grandfather    Dementia Sister    Colon cancer Neg Hx     Social History Social History   Tobacco Use   Smoking status: Never   Smokeless tobacco: Never  Vaping Use   Vaping Use: Never used  Substance Use Topics   Alcohol use: No   Drug use: No    Review of Systems  Level 5 caveat: AMS  ____________________________________________   PHYSICAL EXAM:  VITAL SIGNS: ED Triage Vitals  Enc Vitals Group     BP 11/30/20 1506 (!) 160/59     Pulse Rate 11/30/20 1506 68     Resp 11/30/20 1506 18     Temp 11/30/20 1506 98.5 F (36.9 C)     Temp Source 11/30/20 1506 Oral     SpO2 11/30/20 1506 98 %     Weight 11/30/20 1458 135 lb 5.8 oz (61.4 kg)     Height 11/30/20 1458 '5\' 4"'  (1.626 m)   Constitutional: Alert but seeming confused. No somnolence.  Eyes: Conjunctivae are normal. PERRL.  Head: Atraumatic. Nose: No congestion/rhinnorhea. Mouth/Throat: Mucous membranes are dry.  Neck: No stridor.   Cardiovascular: Normal rate, regular rhythm. Good peripheral circulation. Grossly normal heart sounds.   Respiratory: Normal respiratory effort.  No retractions. Lungs CTAB. Gastrointestinal: Soft and nontender. No distention.  Musculoskeletal: No lower extremity tenderness nor edema. No gross deformities of extremities. Neurologic:  Normal speech and language. No  gross focal neurologic deficits are appreciated.  Skin:  Skin is warm, dry and intact. No rash noted.  ____________________________________________   LABS (all labs ordered are listed, but only abnormal results are displayed)  Labs Reviewed  RESP PANEL BY RT-PCR (FLU A&B, COVID) ARPGX2 - Abnormal; Notable for the following components:      Result Value   SARS Coronavirus 2 by RT PCR POSITIVE (*)    All other components within normal limits  COMPREHENSIVE METABOLIC PANEL - Abnormal; Notable for the following components:   Glucose, Bld 141 (*)  BUN 41 (*)    Creatinine, Ser 1.60 (*)    Calcium 8.6 (*)    GFR, Estimated 30 (*)    All other components within normal limits  CBC WITH DIFFERENTIAL/PLATELET - Abnormal; Notable for the following components:   RBC 3.22 (*)    Hemoglobin 10.4 (*)    HCT 33.1 (*)    MCV 102.8 (*)    Monocytes Absolute 1.4 (*)    All other components within normal limits  URINALYSIS, ROUTINE W REFLEX MICROSCOPIC - Abnormal; Notable for the following components:   Glucose, UA 50 (*)    Hgb urine dipstick SMALL (*)    Protein, ur 100 (*)    All other components within normal limits  URINE CULTURE  CULTURE, BLOOD (ROUTINE X 2)  CULTURE, BLOOD (ROUTINE X 2)  LIPASE, BLOOD  TSH  LACTIC ACID, PLASMA  LACTIC ACID, PLASMA  D-DIMER, QUANTITATIVE  PROCALCITONIN  LACTATE DEHYDROGENASE  FERRITIN  TRIGLYCERIDES  FIBRINOGEN  C-REACTIVE PROTEIN   ____________________________________________  EKG  None   ____________________________________________  RADIOLOGY  CT Head Wo Contrast  Result Date: 11/30/2020 CLINICAL DATA:  87 year old female with altered mental status. EXAM: CT HEAD WITHOUT CONTRAST TECHNIQUE: Contiguous axial images were obtained from the base of the skull through the vertex without intravenous contrast. COMPARISON:  Head CT dated 05/29/2011. FINDINGS: Brain: Mild age-related atrophy and chronic microvascular ischemic changes. There is  no acute intracranial hemorrhage. No mass effect or midline shift. No extra-axial fluid collection. Vascular: No hyperdense vessel or unexpected calcification. Skull: Normal. Negative for fracture or focal lesion. Sinuses/Orbits: There is complete opacification of the left maxillary sinus with chronic remodeling and destruction of the medial wall. Bilateral mastoid effusions. No air-fluid level. Other: None IMPRESSION: 1. No acute intracranial pathology. 2. Mild age-related atrophy and chronic microvascular ischemic changes. 3. Chronic left maxillary sinus disease. 4. Bilateral mastoid effusions. Electronically Signed   By: Anner Crete M.D.   On: 11/30/2020 16:20   DG Chest Portable 1 View  Result Date: 11/30/2020 CLINICAL DATA:  Altered mental status. EXAM: PORTABLE CHEST 1 VIEW COMPARISON:  Single-view of the chest 12/30/2017 and CT chest 12/31/2017. FINDINGS: Trace bilateral pleural effusions and mild basilar atelectasis. Heart size is normal. Aortic atherosclerosis. No pneumothorax. No acute or focal bony abnormality. IMPRESSION: Trace bilateral pleural effusions and mild bibasilar atelectasis. Aortic Atherosclerosis (ICD10-I70.0). Electronically Signed   By: Inge Rise M.D.   On: 11/30/2020 15:42    ____________________________________________   PROCEDURES  Procedure(s) performed:   Procedures  None  ____________________________________________   INITIAL IMPRESSION / ASSESSMENT AND PLAN / ED COURSE  Pertinent labs & imaging results that were available during my care of the patient were reviewed by me and considered in my medical decision making (see chart for details).   Patient presents to the emergency department with generalized weakness and confusion starting today.  Report from EMS is that the patient has also not had urine output in the past 2 days.  She does seem clinically dehydrated.  Reviewed her medication list.  Patient arrives with DNR and MOST form at bedside  which were reviewed.  Doubt stroke with lack of focal neurologic findings.  Question dehydration, acute kidney injury, metabolic encephalopathy, infection.   Patient's lab work resulting.  She has some renal insufficiency roughly in line with 2019 labs.  No leukocytosis.  No urinary tract infection.  CT imaging of the head and chest x-ray reviewed with no acute findings.  Patient's COVID test has come  back positive which may be contributing.  We will check with the nursing facility to see if this is the first positive test or if patient has tested positive previously and and what time.  Hold on remdesivir pending this discussion but if acute COVID may account for the patient's encephalopathy.   05:10 PM  Further clarification with the nursing home.  Patient tested positive for COVID in February.  She had a -90-day follow-up test in May.  With positive test here I suspect this is a reinfection.   Discussed patient's case with [facility], Dr. Denton Brick to request admission. Patient and family (if present) updated with plan. Care transferred to [facility] service.  I reviewed all nursing notes, vitals, pertinent old records, EKGs, labs, imaging (as available).  ____________________________________________  FINAL CLINICAL IMPRESSION(S) / ED DIAGNOSES  Final diagnoses:  Encephalopathy acute  COVID-19     MEDICATIONS GIVEN DURING THIS VISIT:  Medications  remdesivir 100 mg in sodium chloride 0.9 % 100 mL IVPB (has no administration in time range)  remdesivir 100 mg in sodium chloride 0.9 % 100 mL IVPB (has no administration in time range)  sodium chloride 0.9 % bolus 500 mL (0 mLs Intravenous Stopped 11/30/20 1658)     Note:  This document was prepared using Dragon voice recognition software and may include unintentional dictation errors.  Nanda Quinton, MD, [facility]    Lerin Jech, [facility], MD 11/30/20 [phone]

## 2020-11-30 NOTE — ED Notes (Signed)
Bladder scan 130 ml

## 2020-11-30 NOTE — ED Notes (Signed)
Attempted to communicate with patient with pen and paper. Patient not talking much at this time. Attempted to contact daughter with no answer

## 2020-11-30 NOTE — ED Notes (Signed)
Per the daughter, Annye English, patient can communicate through writing better since she is hard of hearing  Daughter also states that patient has tested positive for Covid the last 2 swabs, but unable to give me a specific date of testing. Will attempt to obtain information from [facility]

## 2020-11-30 NOTE — H&P (Addendum)
History and Physical    Cindy Schultz YFV:[id number] DOB: [birth year] DOA: 11/30/2020  PCP: Pcp, No   Patient coming from: [facility]  I have personally briefly reviewed patient's old medical records in [place]  Chief Complaint: Altered mental status  HPI: Cindy Schultz is a 87 y.o. female with medical history significant for GI bleed, hypertension, CKD, DVT. Patient was brought to the ED from nursing home reports of altered mental status, and no urine output in 2 days also with generalized weakness. At the time of my evaluation patient is awake and alert , per daughter and chart review, reportedly hard of hearing but can read and understand.  She is looking, probably reading  what I have written but she is not responding.  Staff reports that this morning patient was unable to ambulate to the shower, she reports her back in a chair, checked on her at 2 PM, her mental status was worse than this morning.  So she was sent to the ED.  Syndrome, patient tested positive for COVID 08/03/2020, she had a 90-day follow-up testing May which was negative.  ED Course: Temp 98.5.  Blood pressure systolic 163W to 466Z.  Heart rate 68 to 80s.  WBC 6.9.  Chest x-ray shows trace effusion.  Head CT without acute abnormality.  Lipase.  Unremarkable BMP.  COVID test positive.  Lactic acid 0.3.  Ordered and pending.  Remdesivir started.  Hospitalist to admit.  Review of Systems: Limited due to altered mental status, patient hard of hearing..  Past Medical History:  Diagnosis Date   Cancer [facility]) March 2016   Skin Cancer :  Right Cheek     SCC   Cancer [facility]) 2013   Skin Cancer  :  Anterior Neck   SCC   Carotid artery occlusion    Chronic kidney disease    Diverticulitis    DVT (deep venous thrombosis) ([place]) 07/14/10   LEA doppler    GI bleeding    2013/2016/2018: suspected diverticular bleeding   Glaucoma    Hearing loss    HTN (hypertension)    cholesterol   Lumbar spine pain     Lymphedema    Macular degeneration    Pain in joints    Precancerous changes of the cervix    lesions   Rectocele    SOB (shortness of breath) 04/04/2010   2D echo EF=>55%    Past Surgical History:  Procedure Laterality Date   ABDOMINAL HYSTERECTOMY     appendix     bladder tac     COLONOSCOPY  09/20/2011   Dr. Carol Ada: pandiverticulosis, hemorrhoids. ascending colon polyp not removed in setting of active bleeding.   COLONOSCOPY N/A 04/10/2017   Dr. [name]: three 4-6 mm polyps in ascending colon and cecum, s/p removal. tubular adenomas. diverticulosis in entire examined colon.    corneal erosion     cataracts    elevated metatarsal arch     fallen uterus     hemmorrhoidectomy     IR KYPHO LUMBAR INC FX REDUCE BONE BX UNI/BIL CANNULATION INC/IMAGING  12/17/2017   left total knee replacement  05/02/04   [facility]   met osteostomy     morton's nueroma     OVARIAN CYST SURGERY     RECTOCELE REPAIR     rupture rt groin     salk     SKIN CANCER EXCISION Right March 2016  2013   Cheek -  [facility]  and  Anterior  Neck   tracheotomy       reports that she has never smoked. She has never used smokeless tobacco. She reports that she does not drink alcohol and does not use drugs.  Allergies  Allergen Reactions   Codeine Nausea And Vomiting   Aspirin     Cannot take due to Diverticulosis (bleeding side effects)   Pepto-Bismol [Bismuth Subsalicylate]     Black stools   Hydrocodone Other (See Comments)    Unknown Reaction     Family History  Problem Relation Age of Onset   Stroke Mother    Diabetes Mother    Heart attack Father    COPD Paternal Grandfather    Dementia Sister    Colon cancer Neg Hx    Prior to Admission medications   Medication Sig Start Date End Date Taking? Authorizing Provider  acetaminophen (TYLENOL) 500 MG tablet Take 500 mg by mouth every 6 (six) hours as needed (pain).     [provider]  Calcium 600-200 MG-UNIT per tablet Take 1 tablet by  mouth daily.      [provider]  docusate sodium (COLACE) 100 MG capsule Take 100 mg by mouth daily.    [provider]  donepezil (ARICEPT) 5 MG tablet Take 1 tablet (5 mg total) by mouth at bedtime. 01/06/18   Sinda Du, MD  dorzolamide-timolol (COSOPT) 22.3-6.8 MG/ML ophthalmic solution Place 1 drop into both eyes 2 (two) times daily.      [provider]  furosemide (LASIX) 20 MG tablet Take 2 tablets (40 mg total) by mouth daily for 5 days. Take 1 tablet by mouth once a day for 7 days 04/20/18 04/25/18  Carmin Muskrat, MD  ipratropium-albuterol (DUONEB) 0.5-2.5 (3) MG/3ML SOLN Take 3 mLs by nebulization every 6 (six) hours as needed. 01/06/18   Sinda Du, MD  losartan (COZAAR) 50 MG tablet Take 50 mg by mouth daily.  12/25/14   [provider]  LUMIGAN 0.01 % SOLN Place 1 drop into both eyes at bedtime.  03/06/17   [provider]  Melatonin 5 MG TABS Take by mouth at bedtime.    [provider]  Multiple Vitamins-Minerals (PRESERVISION AREDS 2 PO) Take 2 tablets by mouth every morning. One tablet daily     [provider]  NIFEdipine (PROCARDIA-XL/ADALAT-CC/NIFEDICAL-XL) 30 MG 24 hr tablet Take 30 mg by mouth daily. 11/24/17   [provider]  oxybutynin (DITROPAN-XL) 10 MG 24 hr tablet Take 10 mg by mouth daily.     [provider]  pantoprazole (PROTONIX) 40 MG tablet Take 40 mg by mouth daily. 11/25/17   [provider]  polyethylene glycol (MIRALAX / GLYCOLAX) packet Take 17 g by mouth daily.    [provider]  potassium chloride SA (K-DUR,KLOR-CON) 20 MEQ tablet Take 20 mEq by mouth daily. Take 1 tablet by mouth once a day for 10 days    [provider]  promethazine (PHENERGAN) 12.5 MG tablet Take 1 tablet (12.5 mg total) by mouth every 6 (six) hours as needed for nausea or vomiting. 10/21/17   Carole Civil, MD  traMADol (ULTRAM) 50 MG tablet Take 1 tablet (50 mg total)  by mouth every 6 (six) hours as needed. Patient taking differently: Take 25-50 mg by mouth every 6 (six) hours as needed.  10/21/17   Carole Civil, MD    Physical Exam: Limited due to patient's altered mental status, not cooperating with exam Vitals:   11/30/20 1530  11/30/20 1600 11/30/20 1630 11/30/20 1700  BP: (!) 155/77 (!) 185/85 (!) 176/78 (!) 181/72  Pulse: 72 80 75 73  Resp: 15 (!) '26 14 12  ' Temp:      TempSrc:      SpO2: 99% 99% 99% 99%  Weight:      Height:        Constitutional: NAD, calm, comfortable Vitals:   11/30/20 1530 11/30/20 1600 11/30/20 1630 11/30/20 1700  BP: (!) 155/77 (!) 185/85 (!) 176/78 (!) 181/72  Pulse: 72 80 75 73  Resp: 15 (!) '26 14 12  ' Temp:      TempSrc:      SpO2: 99% 99% 99% 99%  Weight:      Height:       Eyes: PERRL, lids and conjunctivae normal ENMT: Mucous membranes are moist.  Neck: normal, supple, no masses, no thyromegaly Respiratory: Normal respiratory effort. No accessory muscle use.  Cardiovascular: Regular rate and rhythm, no extremity edema. 2+ pedal pulses.  Abdomen: no tenderness, no masses palpated. No hepatosplenomegaly. Bowel sounds positive.  Musculoskeletal: no clubbing / cyanosis. No joint deformity upper and lower extremities. Good ROM, no contractures. Normal muscle tone.  Skin: no rashes, lesions, ulcers. No induration Neurologic:, Sitting up in bed, no apparent cranial abnormality, moving extremities spontaneously.  Psychiatric: Awake and alert, unable to assess due to altered mental status.  Labs on Admission: I have personally reviewed following labs and imaging studies  CBC: Recent Labs  Lab 11/30/20 1525  WBC 6.9  NEUTROABS 3.4  HGB 10.4*  HCT 33.1*  MCV 102.8*  PLT 503   Basic Metabolic Panel: Recent Labs  Lab 11/30/20 1525  NA 136  K 4.8  CL 105  CO2 22  GLUCOSE 141*  BUN 41*  CREATININE 1.60*  CALCIUM 8.6*   Liver Function Tests: Recent Labs  Lab 11/30/20 1525  AST 22  ALT 14   ALKPHOS 63  BILITOT 0.4  PROT 7.0  ALBUMIN 3.9   Recent Labs  Lab 11/30/20 1525  LIPASE 35   Thyroid Function Tests: Recent Labs    11/30/20 1525  TSH 2.672   Urine analysis:    Component Value Date/Time   COLORURINE YELLOW 11/30/2020 1511   APPEARANCEUR CLEAR 11/30/2020 1511   LABSPEC 1.016 11/30/2020 1511   PHURINE 6.0 11/30/2020 1511   GLUCOSEU 50 (A) 11/30/2020 1511   HGBUR SMALL (A) 11/30/2020 1511   BILIRUBINUR NEGATIVE 11/30/2020 1511   KETONESUR NEGATIVE 11/30/2020 1511   PROTEINUR 100 (A) 11/30/2020 1511   UROBILINOGEN 0.2 08/05/2009 1533   NITRITE NEGATIVE 11/30/2020 1511   LEUKOCYTESUR NEGATIVE 11/30/2020 1511    Radiological Exams on Admission: CT Head Wo Contrast  Result Date: 11/30/2020 CLINICAL DATA:  87 year old female with altered mental status. EXAM: CT HEAD WITHOUT CONTRAST TECHNIQUE: Contiguous axial images were obtained from the base of the skull through the vertex without intravenous contrast. COMPARISON:  Head CT dated 05/29/2011. FINDINGS: Brain: Mild age-related atrophy and chronic microvascular ischemic changes. There is no acute intracranial hemorrhage. No mass effect or midline shift. No extra-axial fluid collection. Vascular: No hyperdense vessel or unexpected calcification. Skull: Normal. Negative for fracture or focal lesion. Sinuses/Orbits: There is complete opacification of the left maxillary sinus with chronic remodeling and destruction of the medial wall. Bilateral mastoid effusions. No air-fluid level. Other: None IMPRESSION: 1. No acute intracranial pathology. 2. Mild age-related atrophy and chronic microvascular ischemic changes. 3. Chronic left maxillary sinus disease. 4. Bilateral mastoid effusions. Electronically Signed  By: Anner Crete M.D.   On: 11/30/2020 16:20   DG Chest Portable 1 View  Result Date: 11/30/2020 CLINICAL DATA:  Altered mental status. EXAM: PORTABLE CHEST 1 VIEW COMPARISON:  Single-view of the chest 12/30/2017  and CT chest 12/31/2017. FINDINGS: Trace bilateral pleural effusions and mild basilar atelectasis. Heart size is normal. Aortic atherosclerosis. No pneumothorax. No acute or focal bony abnormality. IMPRESSION: Trace bilateral pleural effusions and mild bibasilar atelectasis. Aortic Atherosclerosis (ICD10-I70.0). Electronically Signed   By: Inge Rise M.D.   On: 11/30/2020 15:42    EKG: Pending  Assessment/Plan Principal Problem:   AMS (altered mental status) Active Problems:   GI bleed   HTN (hypertension)   CKD (chronic kidney disease) stage 3, GFR 30-59 ml/min (HCC)   COVID-19 virus infection    Metabolic encephalopathy-COVID test positive, UA not suggestive of infection.  Chest x-ray shows trace effusion.  Head Ct without acute abnormality.  Encephalopathy likely secondary to COVID infection. -Resume donepezil, Zoloft for now - Gentle hydration N/s 75cc/hr x 15hrs  COVID-19 infection-does not appear to have any respiratory symptoms.  Chest x-ray shows trace effusions.  O2 sats 98% on room air.  -Remdesivir started in the ED, will continue -Pharmacy consult for paxlovid -patient is not a candidate due to her low GFR. -Obtain a trend inflammatory markers  Hypertension-blood pressure elevated 160s to 180s. -Resume losartan 12.5, nifedipine - PRN Labetalol 46m  CKD3B creatinine 1.6 about baseline.   DVT prophylaxis: heparin Code Status: DNR- form at bedside Family Communication: None at bedside Disposition Plan: ~ 1 -2 days Consults called: None Admission status: Obs, tele    EBethena RoysMD [facility]  11/30/2020, 8:20 PM

## 2020-11-30 NOTE — ED Notes (Signed)
MSE not signed. Pt with AMS

## 2020-11-30 NOTE — ED Notes (Signed)
Pt took leads, all monitoring and ripped IV out earlier. Mittens were applied to pt and pt has once again gotten out of her mits and ripped IV out, got undressed and pulled all monitoring cords off. Hospitalist to be made aware for further instruction.

## 2020-11-30 NOTE — ED Notes (Signed)
Call placed to Cindy Schultz, for clarification on pt baseline and LKW-per staff pt is a&o x 4 at baseline, stands and pivots but is non-ambulatory; transport by w/c; staff reports LKW was 11/29/20 unknown time; pt was found confused at 0830 today and has increased confusion today with drowsiness-needing to be prompt to eat

## 2020-11-30 NOTE — ED Notes (Signed)
Call with Romana Juniper, at [facility], advised pt tested positive for COVID 08/03/20; after 90 days (May 2022), pt tested negative

## 2020-12-01 DIAGNOSIS — N1832 Chronic kidney disease, stage 3b: Secondary | ICD-10-CM | POA: Diagnosis present

## 2020-12-01 DIAGNOSIS — Z85828 Personal history of other malignant neoplasm of skin: Secondary | ICD-10-CM | POA: Diagnosis not present

## 2020-12-01 DIAGNOSIS — Z8249 Family history of ischemic heart disease and other diseases of the circulatory system: Secondary | ICD-10-CM | POA: Diagnosis not present

## 2020-12-01 DIAGNOSIS — H3530 Unspecified macular degeneration: Secondary | ICD-10-CM | POA: Diagnosis present

## 2020-12-01 DIAGNOSIS — Z90710 Acquired absence of both cervix and uterus: Secondary | ICD-10-CM | POA: Diagnosis not present

## 2020-12-01 DIAGNOSIS — Z8616 Personal history of COVID-19: Secondary | ICD-10-CM | POA: Diagnosis not present

## 2020-12-01 DIAGNOSIS — G9341 Metabolic encephalopathy: Secondary | ICD-10-CM | POA: Diagnosis present

## 2020-12-01 DIAGNOSIS — I10 Essential (primary) hypertension: Secondary | ICD-10-CM | POA: Diagnosis not present

## 2020-12-01 DIAGNOSIS — H409 Unspecified glaucoma: Secondary | ICD-10-CM | POA: Diagnosis present

## 2020-12-01 DIAGNOSIS — I129 Hypertensive chronic kidney disease with stage 1 through stage 4 chronic kidney disease, or unspecified chronic kidney disease: Secondary | ICD-10-CM | POA: Diagnosis present

## 2020-12-01 DIAGNOSIS — R4182 Altered mental status, unspecified: Secondary | ICD-10-CM | POA: Diagnosis not present

## 2020-12-01 DIAGNOSIS — U071 COVID-19: Secondary | ICD-10-CM | POA: Diagnosis present

## 2020-12-01 DIAGNOSIS — Z66 Do not resuscitate: Secondary | ICD-10-CM | POA: Diagnosis present

## 2020-12-01 DIAGNOSIS — Z86718 Personal history of other venous thrombosis and embolism: Secondary | ICD-10-CM | POA: Diagnosis not present

## 2020-12-01 DIAGNOSIS — Z96652 Presence of left artificial knee joint: Secondary | ICD-10-CM | POA: Diagnosis present

## 2020-12-01 DIAGNOSIS — Z885 Allergy status to narcotic agent status: Secondary | ICD-10-CM | POA: Diagnosis not present

## 2020-12-01 DIAGNOSIS — Z886 Allergy status to analgesic agent status: Secondary | ICD-10-CM | POA: Diagnosis not present

## 2020-12-01 DIAGNOSIS — H9190 Unspecified hearing loss, unspecified ear: Secondary | ICD-10-CM | POA: Diagnosis present

## 2020-12-01 DIAGNOSIS — F0390 Unspecified dementia without behavioral disturbance: Secondary | ICD-10-CM | POA: Diagnosis present

## 2020-12-01 DIAGNOSIS — Z79891 Long term (current) use of opiate analgesic: Secondary | ICD-10-CM | POA: Diagnosis not present

## 2020-12-01 DIAGNOSIS — Z79899 Other long term (current) drug therapy: Secondary | ICD-10-CM | POA: Diagnosis not present

## 2020-12-01 DIAGNOSIS — D631 Anemia in chronic kidney disease: Secondary | ICD-10-CM | POA: Diagnosis present

## 2020-12-01 DIAGNOSIS — N179 Acute kidney failure, unspecified: Secondary | ICD-10-CM | POA: Diagnosis present

## 2020-12-01 LAB — MAGNESIUM: Magnesium: 2.2 mg/dL (ref 1.7–2.4)

## 2020-12-01 LAB — CBC WITH DIFFERENTIAL/PLATELET
Abs Immature Granulocytes: 0.02 10*3/uL (ref 0.00–0.07)
Basophils Absolute: 0 10*3/uL (ref 0.0–0.1)
Basophils Relative: 0 %
Eosinophils Absolute: 0.1 10*3/uL (ref 0.0–0.5)
Eosinophils Relative: 1 %
HCT: 29.4 % — ABNORMAL LOW (ref 36.0–46.0)
Hemoglobin: 9.3 g/dL — ABNORMAL LOW (ref 12.0–15.0)
Immature Granulocytes: 0 %
Lymphocytes Relative: 32 %
Lymphs Abs: 2.6 10*3/uL (ref 0.7–4.0)
MCH: 32.3 pg (ref 26.0–34.0)
MCHC: 31.6 g/dL (ref 30.0–36.0)
MCV: 102.1 fL — ABNORMAL HIGH (ref 80.0–100.0)
Monocytes Absolute: 1.3 10*3/uL — ABNORMAL HIGH (ref 0.1–1.0)
Monocytes Relative: 17 %
Neutro Abs: 4 10*3/uL (ref 1.7–7.7)
Neutrophils Relative %: 50 %
Platelets: 266 10*3/uL (ref 150–400)
RBC: 2.88 MIL/uL — ABNORMAL LOW (ref 3.87–5.11)
RDW: 14.7 % (ref 11.5–15.5)
WBC: 8 10*3/uL (ref 4.0–10.5)
nRBC: 0 % (ref 0.0–0.2)

## 2020-12-01 LAB — COMPREHENSIVE METABOLIC PANEL
ALT: 13 U/L (ref 0–44)
AST: 21 U/L (ref 15–41)
Albumin: 3.4 g/dL — ABNORMAL LOW (ref 3.5–5.0)
Alkaline Phosphatase: 65 U/L (ref 38–126)
Anion gap: 5 (ref 5–15)
BUN: 34 mg/dL — ABNORMAL HIGH (ref 8–23)
CO2: 25 mmol/L (ref 22–32)
Calcium: 8.6 mg/dL — ABNORMAL LOW (ref 8.9–10.3)
Chloride: 109 mmol/L (ref 98–111)
Creatinine, Ser: 1.31 mg/dL — ABNORMAL HIGH (ref 0.44–1.00)
GFR, Estimated: 38 mL/min — ABNORMAL LOW (ref >60)
Glucose, Bld: 96 mg/dL (ref 70–99)
Potassium: 4.1 mmol/L (ref 3.5–5.1)
Sodium: 139 mmol/L (ref 135–145)
Total Bilirubin: 0.2 mg/dL — ABNORMAL LOW (ref 0.3–1.2)
Total Protein: 6.4 g/dL — ABNORMAL LOW (ref 6.5–8.1)

## 2020-12-01 LAB — C-REACTIVE PROTEIN: CRP: 0.6 mg/dL (ref <1.0)

## 2020-12-01 LAB — PHOSPHORUS: Phosphorus: 3.4 mg/dL (ref 2.5–4.6)

## 2020-12-01 LAB — D-DIMER, QUANTITATIVE: D-Dimer, Quant: 0.8 ug/mL-FEU — ABNORMAL HIGH (ref 0.00–0.50)

## 2020-12-01 LAB — FERRITIN: Ferritin: 16 ng/mL (ref 11–307)

## 2020-12-01 MED ORDER — SODIUM CHLORIDE 0.9 % IV SOLN: INTRAVENOUS | Status: AC

## 2020-12-01 MED ORDER — NIFEDIPINE ER OSMOTIC RELEASE 30 MG PO TB24
60.0000 mg | ORAL_TABLET | Freq: Every day | ORAL | Status: DC
  Administered 2020-12-02: 60 mg via ORAL
  Filled 2020-12-01: qty 2

## 2020-12-01 MED ORDER — LABETALOL HCL 5 MG/ML IV SOLN
10.0000 mg | INTRAVENOUS | Status: DC | PRN
  Filled 2020-12-01: qty 4

## 2020-12-01 MED ORDER — DONEPEZIL HCL 5 MG PO TABS
5.0000 mg | ORAL_TABLET | Freq: Every day | ORAL | Status: DC
  Administered 2020-12-01: 5 mg via ORAL
  Filled 2020-12-01: qty 1

## 2020-12-01 NOTE — Care Management Obs Status (Signed)
[facility] NOTIFICATION   Patient Details  Name: Cindy Schultz MRN: [id number] Date of Birth: 1924-03-31   Medicare Observation Status Notification Given:  Yes    Natasha Bence, LCSW 12/01/2020, 5:15 PM

## 2020-12-01 NOTE — ED Notes (Signed)
Making rounds on patient found patient to have pulled mitts off and pulled IV out , pulse ox off and EKG leads off. Placed mitts back on. Changed out suction canister for purewick at this time. RN Loma Sousa made aware.

## 2020-12-01 NOTE — Progress Notes (Signed)
Patient Demographics:    Cindy Schultz, is a 87 y.o. female, DOB - 05-13-1924, BMW:[id number]  Admit date - 11/30/2020   Admitting Physician Ejiroghene Arlyce Dice, MD  Outpatient Primary MD for the patient is Pcp, No  LOS - 0   Chief Complaint  Patient presents with   Altered Mental Status        Subjective:    Novice Vrba today has no fevers, no emesis,  No chest pain,   -No productive cough, no hypoxia, oral intake is not great  Assessment  & Plan :    Principal Problem:   AMS (altered mental status) Active Problems:   GI bleed   HTN (hypertension)   CKD (chronic kidney disease) stage 3, GFR 30-59 ml/min (HCC)   COVID-19 virus infection  Brief Summary:-  87 y.o. female with medical history significant for GI bleed, hypertension, CKD, DVT admitted on 11/30/2020 from [facility] with concerns about altered mentation and possible dehydration decreased urine output  A/p  1) acute metabolic encephalopathy--- superimposed on underlying dementia, suspect related to dehydration and AKI COVID-19 infection Improving with IV fluids// hydration -CT head without acute findings  2) COVID-19 infection--no hypoxia, without pneumonia -Patient largely asymptomatic, inflammatory markers are not particularly elevated -Prophylactic IV remdesivir x3 doses last dose 12/02/2020 -Continue zinc and vitamin C COVID-19 Labs  Recent Labs    11/30/20 1825 12/01/20 0615  DDIMER 0.61* 0.80*  FERRITIN 17 16  LDH 155  --   CRP 0.6 0.6    Lab Results  Component Value Date   SARSCOV2NAA POSITIVE (A) 11/30/2020   3) social/ethics--DNR/DNI, attempted to reach patient's daughter [name] at [phone], left voicemail  4) chronic anemia--- hemoglobin is down to 9.3 with hydration Baseline hemoglobin around 10 -No evidence of ongoing bleeding at this time  5)AKI----acute kidney  injury on CKD stage - 3B -Creatinine is down to 1.3 from 1.6 with hydration --- renally adjust medications, avoid nephrotoxic agents / dehydration  / hypotension  6)HTN--stop losartan due to AKI and dehydration concerns, increase nifedipine to 60 mg daily, may use IV labetalol as needed elevated BP  7) dementia----#1 above may be progression of patient's underlying dementia, continue Aricept and Zoloft  Disposition/Need for in-Hospital Stay- patient unable to be discharged at this time due to -AKI/dehydration with altered mentation requiring IV fluids, COVID-19 infection requiring close monitoring and IV remdesivir  Status is: Inpatient  Remains inpatient appropriate because: Please see disposition above  Disposition: The patient is from: ALF              Anticipated d/c is to: ALF              Anticipated d/c date is: 1 day              Patient currently is not medically stable to d/c. Barriers: Not Clinically Stable-   Code Status :  -  Code Status: DNR   Family Communication:   attempted to reach patient's daughter [name] at [phone], left voicemail  Consults  :  na  DVT Prophylaxis  :   - SCDs   heparin injection 5,000 Units Start: 11/30/20 2200    Lab Results  Component Value Date  PLT 266 12/01/2020    Inpatient Medications  Scheduled Meds:  vitamin C  500 mg Oral Daily   dorzolamide-timolol  1 drop Both Eyes BID   heparin  5,000 Units Subcutaneous Q8H   latanoprost  1 drop Both Eyes Daily   losartan  12.5 mg Oral Daily   melatonin  6 mg Oral QHS   NIFEdipine  30 mg Oral Daily   senna  8.6 mg Oral Daily   sertraline  50 mg Oral Daily   zinc sulfate  220 mg Oral Daily   Continuous Infusions:  remdesivir 100 mg in NS 100 mL Stopped (12/01/20 1026)   PRN Meds:.acetaminophen, guaiFENesin-dextromethorphan, labetalol, ondansetron **OR** ondansetron (ZOFRAN) IV, polyethylene glycol    Anti-infectives (From admission, onward)    Start      Dose/Rate Route Frequency Ordered Stop   12/01/20 1000  remdesivir 100 mg in sodium chloride 0.9 % 100 mL IVPB  Status:  Discontinued        100 mg 200 mL/hr over 30 Minutes Intravenous Daily 11/30/20 1658 11/30/20 1706   12/01/20 1000  remdesivir 100 mg in sodium chloride 0.9 % 100 mL IVPB        100 mg 200 mL/hr over 30 Minutes Intravenous Daily 11/30/20 1717 12/03/20 0959   11/30/20 2200  nirmatrelvir/ritonavir EUA (renal dosing) (PAXLOVID) TABS 2 tablet  Status:  Discontinued        2 tablet Oral 2 times daily 11/30/20 2019 11/30/20 2026   11/30/20 1730  remdesivir 100 mg in sodium chloride 0.9 % 100 mL IVPB  Status:  Discontinued        100 mg 200 mL/hr over 30 Minutes Intravenous Every 1 hr x 2 11/30/20 1658 11/30/20 1706   11/30/20 1730  remdesivir 100 mg in sodium chloride 0.9 % 100 mL IVPB        100 mg 200 mL/hr over 30 Minutes Intravenous Every 1 hr x 2 11/30/20 1717 11/30/20 1909         Objective:   Vitals:   12/01/20 1630 12/01/20 1700 12/01/20 1730 12/01/20 1800  BP: (!) 182/82 (!) 194/64 (!) 184/73 (!) 176/75  Pulse: (!) 55 (!) 34 (!) 54 (!) 59  Resp: (!) 26 19 18 15   Temp:      TempSrc:      SpO2: 98% 98% 97% 97%  Weight:      Height:        Wt Readings from Last 3 Encounters:  11/30/20 61.4 kg  04/20/18 61.4 kg  01/06/18 61.4 kg     Intake/Output Summary (Last 24 hours) at 12/01/2020 1831 Last data filed at 12/01/2020 0011 Gross per 24 hour  Intake 194.46 ml  Output 1000 ml  Net -805.54 ml     Physical Exam  Gen:- Awake Alert, in no acute distress, cooperative HEENT:- [place].AT, No sclera icterus, HOH Neck-Supple Neck,No JVD,.  Lungs-  CTAB , fair symmetrical air movement CV- S1, S2 normal, regular  Abd-  +ve B.Sounds, Abd Soft, No tenderness,    Extremity/Skin:- No  edema, pedal pulses present  Psych-underlying cognitive and memory deficits Neuro-no new focal deficits, no tremors   Data Review:   Micro Results Recent Results (from the past  240 hour(s))  Resp Panel by RT-PCR (Flu A&B, Covid) Nasopharyngeal Swab     Status: Abnormal   Collection Time: 11/30/20  3:24 PM   Specimen: Nasopharyngeal Swab; Nasopharyngeal(NP) swabs in vial transport medium  Result Value Ref Range Status   SARS  Coronavirus 2 by RT PCR POSITIVE (A) NEGATIVE Final    Comment: RESULT CALLED TO, READ BACK BY AND VERIFIED WITH: CRAWFORD,H ON 11/30/20 AT 1645 BY LOY,C (NOTE) SARS-CoV-2 target nucleic acids are DETECTED.  The SARS-CoV-2 RNA is generally detectable in upper respiratory specimens during the acute phase of infection. Positive results are indicative of the presence of the identified virus, but do not rule out bacterial infection or co-infection with other pathogens not detected by the test. Clinical correlation with patient history and other diagnostic information is necessary to determine patient infection status. The expected result is Negative.  Fact Sheet for Patients: EntrepreneurPulse.com.au  Fact Sheet for Healthcare Providers: IncredibleEmployment.be  This test is not yet approved or cleared by the [place] FDA and  has been authorized for detection and/or diagnosis of SARS-CoV-2 by FDA under an Emergency Use Authorization (EUA).  This EUA will remain in effect (meaning this test c an be used) for the duration of  the COVID-19 declaration under Section 564(b)(1) of the Act, 21 U.S.C. section 360bbb-3(b)(1), unless the authorization is terminated or revoked sooner.     Influenza A by PCR NEGATIVE NEGATIVE Final   Influenza B by PCR NEGATIVE NEGATIVE Final    Comment: (NOTE) The Xpert Xpress SARS-CoV-2/FLU/RSV plus assay is intended as an aid in the diagnosis of influenza from Nasopharyngeal swab specimens and should not be used as a sole basis for treatment. Nasal washings and aspirates are unacceptable for Xpert Xpress SARS-CoV-2/FLU/RSV testing.  Fact Sheet for  Patients: EntrepreneurPulse.com.au  Fact Sheet for Healthcare Providers: IncredibleEmployment.be  This test is not yet approved or cleared by the [place] FDA and has been authorized for detection and/or diagnosis of SARS-CoV-2 by FDA under an Emergency Use Authorization (EUA). This EUA will remain in effect (meaning this test can be used) for the duration of the COVID-19 declaration under Section 564(b)(1) of the Act, 21 U.S.C. section 360bbb-3(b)(1), unless the authorization is terminated or revoked.  Performed at [facility], [address], [place], [place] [address]   Blood Culture (routine x 2)     Status: None (Preliminary result)   Collection Time: 11/30/20  6:07 PM   Specimen: Right Antecubital; Blood  Result Value Ref Range Status   Specimen Description RIGHT ANTECUBITAL  Final   Special Requests   Final    BOTTLES DRAWN AEROBIC AND ANAEROBIC Blood Culture adequate volume   Culture   Final    NO GROWTH < 12 HOURS Performed at [facility], [address], [place], [place] [address]    Report Status PENDING  Incomplete  Blood Culture (routine x 2)     Status: None (Preliminary result)   Collection Time: 11/30/20  6:25 PM   Specimen: BLOOD RIGHT HAND  Result Value Ref Range Status   Specimen Description BLOOD RIGHT HAND  Final   Special Requests   Final    BOTTLES DRAWN AEROBIC AND ANAEROBIC Blood Culture results may not be optimal due to an inadequate volume of blood received in culture bottles   Culture   Final    NO GROWTH < 12 HOURS Performed at [facility], [address], [place],  [address]    Report Status PENDING  Incomplete    Radiology Reports CT Head Wo Contrast  Result Date: 11/30/2020 CLINICAL DATA:  87 year old female with altered mental status. EXAM: CT HEAD WITHOUT CONTRAST TECHNIQUE: Contiguous axial images were obtained from the base of the skull through the vertex without intravenous contrast.  COMPARISON:  Head CT dated 05/29/2011. FINDINGS: Brain: Mild age-related atrophy and chronic microvascular ischemic changes. There is no acute intracranial hemorrhage. No mass effect or midline shift. No extra-axial fluid collection. Vascular: No hyperdense vessel or unexpected calcification. Skull: Normal. Negative for fracture or focal lesion. Sinuses/Orbits: There is complete opacification of the left maxillary sinus with chronic remodeling and destruction of the medial wall. Bilateral mastoid effusions. No air-fluid level. Other: None IMPRESSION: 1. No acute intracranial pathology. 2. Mild age-related atrophy and chronic microvascular ischemic changes. 3. Chronic left maxillary sinus disease. 4. Bilateral mastoid effusions. Electronically Signed   By: Anner Crete M.D.   On: 11/30/2020 16:20   DG Chest Portable 1 View  Result Date: 11/30/2020 CLINICAL DATA:  Altered mental status. EXAM: PORTABLE CHEST 1 VIEW COMPARISON:  Single-view of the chest 12/30/2017 and CT chest 12/31/2017. FINDINGS: Trace bilateral pleural effusions and mild basilar atelectasis. Heart size is normal. Aortic atherosclerosis. No pneumothorax. No acute or focal bony abnormality. IMPRESSION: Trace bilateral pleural effusions and mild bibasilar atelectasis. Aortic Atherosclerosis (ICD10-I70.0). Electronically Signed   By: Inge Rise M.D.   On: 11/30/2020 15:42     CBC Recent Labs  Lab 11/30/20 1525 12/01/20 0615  WBC 6.9 8.0  HGB 10.4* 9.3*  HCT 33.1* 29.4*  PLT 277 266  MCV 102.8* 102.1*  MCH 32.3 32.3  MCHC 31.4 31.6  RDW 14.6 14.7  LYMPHSABS 2.1 2.6  MONOABS 1.4* 1.3*  EOSABS 0.0 0.1  BASOSABS 0.0 0.0    Chemistries  Recent Labs  Lab 11/30/20 1525 12/01/20 0615  NA 136 139  K 4.8 4.1  CL 105 109  CO2 22 25  GLUCOSE 141* 96  BUN 41* 34*  CREATININE 1.60* 1.31*  CALCIUM 8.6* 8.6*  MG  --  2.2  AST 22 21  ALT 14 13  ALKPHOS 63 65  BILITOT 0.4 0.2*    ------------------------------------------------------------------------------------------------------------------ Recent Labs    11/30/20 1825  TRIG 78    No results found for: HGBA1C ------------------------------------------------------------------------------------------------------------------ Recent Labs    11/30/20 1525  TSH 2.672   ------------------------------------------------------------------------------------------------------------------ Recent Labs    11/30/20 1825 12/01/20 0615  FERRITIN 17 16    Coagulation profile No results for input(s): INR, PROTIME in the last 168 hours.  Recent Labs    11/30/20 1825 12/01/20 0615  DDIMER 0.61* 0.80*    Cardiac Enzymes No results for input(s): CKMB, TROPONINI, MYOGLOBIN in the last 168 hours.  Invalid input(s): CK ------------------------------------------------------------------------------------------------------------------    Component Value Date/Time   BNP 415.0 (H) 04/20/2018 1534     Roxan Hockey M.D on 12/01/2020 at 6:31 PM  Go to [email or web address] - for contact info  [facility] - Office  [phone]

## 2020-12-01 NOTE — ED Notes (Signed)
Me and RN TE cleaned up pt put new bed linen and changed her urine canister @ 2.15

## 2020-12-01 NOTE — ED Notes (Signed)
Fed pt a cup of vanilla pudding and soda

## 2020-12-02 DIAGNOSIS — G9341 Metabolic encephalopathy: Secondary | ICD-10-CM

## 2020-12-02 LAB — CBC WITH DIFFERENTIAL/PLATELET
Abs Immature Granulocytes: 0.02 10*3/uL (ref 0.00–0.07)
Basophils Absolute: 0 10*3/uL (ref 0.0–0.1)
Basophils Relative: 0 %
Eosinophils Absolute: 0.1 10*3/uL (ref 0.0–0.5)
Eosinophils Relative: 2 %
HCT: 28.8 % — ABNORMAL LOW (ref 36.0–46.0)
Hemoglobin: 9.1 g/dL — ABNORMAL LOW (ref 12.0–15.0)
Immature Granulocytes: 0 %
Lymphocytes Relative: 34 %
Lymphs Abs: 2.6 10*3/uL (ref 0.7–4.0)
MCH: 32.3 pg (ref 26.0–34.0)
MCHC: 31.6 g/dL (ref 30.0–36.0)
MCV: 102.1 fL — ABNORMAL HIGH (ref 80.0–100.0)
Monocytes Absolute: 1.2 10*3/uL — ABNORMAL HIGH (ref 0.1–1.0)
Monocytes Relative: 16 %
Neutro Abs: 3.6 10*3/uL (ref 1.7–7.7)
Neutrophils Relative %: 48 %
Platelets: 257 10*3/uL (ref 150–400)
RBC: 2.82 MIL/uL — ABNORMAL LOW (ref 3.87–5.11)
RDW: 14.6 % (ref 11.5–15.5)
WBC: 7.5 10*3/uL (ref 4.0–10.5)
nRBC: 0 % (ref 0.0–0.2)

## 2020-12-02 LAB — URINE CULTURE: Culture: <10000 — AB

## 2020-12-02 LAB — COMPREHENSIVE METABOLIC PANEL
ALT: 13 U/L (ref 0–44)
AST: 20 U/L (ref 15–41)
Albumin: 3.3 g/dL — ABNORMAL LOW (ref 3.5–5.0)
Alkaline Phosphatase: 60 U/L (ref 38–126)
Anion gap: 6 (ref 5–15)
BUN: 34 mg/dL — ABNORMAL HIGH (ref 8–23)
CO2: 24 mmol/L (ref 22–32)
Calcium: 8.6 mg/dL — ABNORMAL LOW (ref 8.9–10.3)
Chloride: 108 mmol/L (ref 98–111)
Creatinine, Ser: 1.4 mg/dL — ABNORMAL HIGH (ref 0.44–1.00)
GFR, Estimated: 35 mL/min — ABNORMAL LOW (ref >60)
Glucose, Bld: 87 mg/dL (ref 70–99)
Potassium: 3.8 mmol/L (ref 3.5–5.1)
Sodium: 138 mmol/L (ref 135–145)
Total Bilirubin: 0.1 mg/dL — ABNORMAL LOW (ref 0.3–1.2)
Total Protein: 6.2 g/dL — ABNORMAL LOW (ref 6.5–8.1)

## 2020-12-02 LAB — FERRITIN: Ferritin: 15 ng/mL (ref 11–307)

## 2020-12-02 LAB — PHOSPHORUS: Phosphorus: 3.8 mg/dL (ref 2.5–4.6)

## 2020-12-02 LAB — C-REACTIVE PROTEIN: CRP: 0.7 mg/dL (ref <1.0)

## 2020-12-02 LAB — MAGNESIUM: Magnesium: 2.1 mg/dL (ref 1.7–2.4)

## 2020-12-02 LAB — D-DIMER, QUANTITATIVE: D-Dimer, Quant: 0.53 ug/mL-FEU — ABNORMAL HIGH (ref 0.00–0.50)

## 2020-12-02 MED ORDER — ASCORBIC ACID 500 MG PO TABS: 500.0000 mg | ORAL_TABLET | Freq: Every day | ORAL | 2 refills | Status: AC

## 2020-12-02 MED ORDER — ZINC SULFATE 220 (50 ZN) MG PO CAPS
220.0000 mg | ORAL_CAPSULE | Freq: Every day | ORAL | 2 refills | Status: AC
Start: 2020-12-02 — End: ?

## 2020-12-02 MED ORDER — ALBUTEROL SULFATE HFA 108 (90 BASE) MCG/ACT IN AERS: 2.0000 | INHALATION_SPRAY | Freq: Four times a day (QID) | RESPIRATORY_TRACT | 2 refills | Status: AC | PRN

## 2020-12-02 MED ORDER — HYDRALAZINE HCL 50 MG PO TABS: 50.0000 mg | ORAL_TABLET | Freq: Three times a day (TID) | ORAL | 2 refills | Status: AC

## 2020-12-02 MED ORDER — GUAIFENESIN ER 600 MG PO TB12: 600.0000 mg | ORAL_TABLET | Freq: Two times a day (BID) | ORAL | 0 refills | Status: AC

## 2020-12-02 MED ORDER — HYDRALAZINE HCL 25 MG PO TABS
100.0000 mg | ORAL_TABLET | Freq: Once | ORAL | Status: AC
  Administered 2020-12-02: 100 mg via ORAL
  Filled 2020-12-02: qty 4

## 2020-12-02 MED ORDER — NIFEDIPINE ER 90 MG PO TB24: 90.0000 mg | ORAL_TABLET | Freq: Every day | ORAL | 2 refills | Status: AC

## 2020-12-02 MED ORDER — SENNOSIDES-DOCUSATE SODIUM 8.6-50 MG PO TABS: 2.0000 | ORAL_TABLET | Freq: Every day | ORAL | 11 refills | Status: AC

## 2020-12-02 MED ORDER — GUAIFENESIN-DM 100-10 MG/5ML PO SYRP: 10.0000 mL | ORAL_SOLUTION | ORAL | 0 refills | Status: AC | PRN

## 2020-12-02 NOTE — Discharge Summary (Signed)
Cindy Schultz, is a 87 y.o. female  DOB [birth year]  MRN [id number].  Admission date:  11/30/2020  Admitting Physician  Roxan Hockey, MD  Discharge Date:  12/02/2020   Primary MD  Pcp, No  Recommendations for primary care physician for things to follow:   1) You are strongly advised to isolate/quarantine for at least 10 days from the date of your diagnosis with COVID-19 infection--please always wear a mask if you have to go outside the house  2)Video/Virtual follow-up visit with primary care physician in about a week advised  3)Please take medications as prescribed   Admission Diagnosis  AMS (altered mental status) [C05.25] Acute metabolic encephalopathy [L10.28]   Discharge Diagnosis  AMS (altered mental status) [D02.28] Acute metabolic encephalopathy [O06.98]    Principal Problem:   Acute metabolic encephalopathy Active Problems:   AMS (altered mental status)   HTN (hypertension)   COVID-19 virus infection   GI bleed   CKD (chronic kidney disease) stage 3, GFR 30-59 ml/min ([place])      Past Medical History:  Diagnosis Date   Cancer [facility]) March 2016   Skin Cancer :  Right Cheek     SCC   Cancer [facility]) 2013   Skin Cancer  :  Anterior Neck   SCC   Carotid artery occlusion    Chronic kidney disease    Diverticulitis    DVT (deep venous thrombosis) ([place]) 07/14/10   LEA doppler    GI bleeding    2013/2016/2018: suspected diverticular bleeding   Glaucoma    Hearing loss    HTN (hypertension)    cholesterol   Lumbar spine pain    Lymphedema    Macular degeneration    Pain in joints    Precancerous changes of the cervix    lesions   Rectocele    SOB (shortness of breath) 04/04/2010   2D echo EF=>55%    Past Surgical History:  Procedure Laterality Date   ABDOMINAL HYSTERECTOMY     appendix     bladder tac     COLONOSCOPY  09/20/2011   Dr. Carol Ada: pandiverticulosis,  hemorrhoids. ascending colon polyp not removed in setting of active bleeding.   COLONOSCOPY N/A 04/10/2017   Dr. [name]: three 4-6 mm polyps in ascending colon and cecum, s/p removal. tubular adenomas. diverticulosis in entire examined colon.    corneal erosion     cataracts    elevated metatarsal arch     fallen uterus     hemmorrhoidectomy     IR KYPHO LUMBAR INC FX REDUCE BONE BX UNI/BIL CANNULATION INC/IMAGING  12/17/2017   left total knee replacement  05/02/04   [place]   met osteostomy     morton's nueroma     OVARIAN CYST SURGERY     RECTOCELE REPAIR     rupture rt groin     salk     SKIN CANCER EXCISION Right March 2016  2013   Cheek -  [facility]  and  Anterior Neck   tracheotomy  HPI  from the history and physical done on the day of admission:    Chief Complaint: Altered mental status   HPI: Cindy Schultz is a 87 y.o. female with medical history significant for GI bleed, hypertension, CKD, DVT. Patient was brought to the ED from nursing home reports of altered mental status, and no urine output in 2 days also with generalized weakness. At the time of my evaluation patient is awake and alert , per daughter and chart review, reportedly hard of hearing but can read and understand.  She is looking, probably reading  what I have written but she is not responding.  Staff reports that this morning patient was unable to ambulate to the shower, she reports her back in a chair, checked on her at 2 PM, her mental status was worse than this morning.  So she was sent to the ED.  Syndrome, patient tested positive for COVID 08/03/2020, she had a 90-day follow-up testing May which was negative.   ED Course: Temp 98.5.  Blood pressure systolic 450T to 888K.  Heart rate 68 to 80s.  WBC 6.9.  Chest x-ray shows trace effusion.  Head CT without acute abnormality.  Lipase.  Unremarkable BMP.  COVID test positive.  Lactic acid 0.3.  Ordered and pending.  Remdesivir started.  Hospitalist to  admit.   Review of Systems: Limited due to altered mental status, patient hard of hearing.Marland Kitchen     Hospital Course:      Brief Summary:-  87 y.o. female with medical history significant for GI bleed, hypertension, CKD, DVT admitted on 11/30/2020 from [facility] assisted living facility with concerns about altered mentation and possible dehydration decreased urine output   A/p  1) acute metabolic encephalopathy--- superimposed on underlying dementia, suspect related to dehydration and AKI COVID-19 infection Improved with IV fluids// hydration -Patient is mostly appropriate and coherent, she is however very hard of hearing and may not understand or hear the questions properly -CT head without acute findings   2) COVID-19 infection--no hypoxia, without pneumonia -Patient largely asymptomatic, inflammatory markers are not particularly elevated -Prophylactic IV remdesivir x3 doses last dose 12/02/2020 -Continue zinc and vitamin C COVID-19 Labs  Recent Labs    11/30/20 1825 12/01/20 0615 12/02/20 0437  DDIMER 0.61* 0.80* 0.53*  FERRITIN _0 LDH 155  --   --   CRP 0.6 0.6 0.7    Lab Results  Component Value Date   SARSCOV2NAA POSITIVE (A) 11/30/2020   3) social/ethics--DNR/DNI, attempted to reach patient's daughter [name] at [phone], left voicemail on 12/01/20 and again on 12/02/20   4) chronic anemia--- hemoglobin is down to 9.1 with hydration Baseline hemoglobin around 10 -No evidence of ongoing bleeding at this time   5)AKI----acute kidney injury on CKD stage - 3B -Creatinine is down to 1.4 from 1.6 with hydration --- renally adjust medications, avoid nephrotoxic agents / dehydration  / hypotension   6)HTN--stopped losartan due to AKI and dehydration concerns, increase nifedipine to 90 mg daily, and add hydralazine 50 mg 3 times daily for better BP control  7) dementia----#1 above may be progression of patient's underlying dementia, continue Aricept and  Zoloft   8)Possible Dysrrhythmia---there was a question of possible arrhythmias on the monitor, review of strips and serial EKG does not demonstrate significant arrhythmia -Remains in sinus rhythm, no evidence of atrial fibrillation  Disposition--discharge back to [facility] assisted living facility  Disposition: The patient is from: ALF  Anticipated d/c is to: [facility]          Barriers: Not Clinically Stable-   Code Status :  -  Code Status: DNR    Family Communication:   attempted to reach patient's daughter [name] at [phone], left voicemail on 12/01/20 and again on 12/02/20   Consults  :  na  Discharge Condition: stable  Follow UP--pcp   Consults obtained - na  Diet and Activity recommendation:  As advised  Discharge Instructions   Discharge Instructions     Call MD for:  difficulty breathing, headache or visual disturbances   Complete by: As directed    Call MD for:  persistant dizziness or light-headedness   Complete by: As directed    Call MD for:  persistant nausea and vomiting   Complete by: As directed    Call MD for:  temperature >100.4   Complete by: As directed    Diet general   Complete by: As directed    Discharge instructions   Complete by: As directed    1) You are strongly advised to isolate/quarantine for at least 10 days from the date of your diagnosis with COVID-19 infection--please always wear a mask if you have to go outside the house  2)Video/Virtual follow-up visit with primary care physician in about a week advised  3)Please take medications as prescribed   Increase activity slowly   Complete by: As directed          Discharge Medications     Allergies as of 12/02/2020       Reactions   Codeine Nausea And Vomiting   Aspirin    Cannot take due to Diverticulosis (bleeding side effects)   Pepto-bismol [bismuth Subsalicylate]    Black stools   Hydrocodone Other (See Comments)   Unknown Reaction          Medication List     STOP taking these medications    docusate sodium 100 MG capsule Commonly known as: COLACE   furosemide 20 MG tablet Commonly known as: LASIX   ipratropium-albuterol 0.5-2.5 (3) MG/3ML Soln Commonly known as: DUONEB   losartan 25 MG tablet Commonly known as: COZAAR   potassium chloride SA 20 MEQ tablet Commonly known as: KLOR-CON   promethazine 12.5 MG tablet Commonly known as: PHENERGAN   senna 8.6 MG tablet Commonly known as: SENOKOT   traMADol 50 MG tablet Commonly known as: ULTRAM       TAKE these medications    acetaminophen 500 MG tablet Commonly known as: TYLENOL Take 500 mg by mouth every 6 (six) hours as needed (pain).   albuterol 108 (90 Base) MCG/ACT inhaler Commonly known as: VENTOLIN HFA Inhale 2 puffs into the lungs every 6 (six) hours as needed for wheezing or shortness of breath.   ascorbic acid 500 MG tablet Commonly known as: VITAMIN C Take 1 tablet (500 mg total) by mouth daily.   donepezil 5 MG tablet Commonly known as: ARICEPT Take 1 tablet (5 mg total) by mouth at bedtime.   dorzolamide-timolol 22.3-6.8 MG/ML ophthalmic solution Commonly known as: COSOPT Place 1 drop into both eyes 2 (two) times daily.   gabapentin 100 MG capsule Commonly known as: NEURONTIN Take 100 mg by mouth at bedtime.   guaiFENesin 600 MG 12 hr tablet Commonly known as: Mucinex Take 1 tablet (600 mg total) by mouth 2 (two) times daily for 10 days.   guaiFENesin-dextromethorphan 100-10 MG/5ML syrup Commonly known as: ROBITUSSIN DM Take 10 mLs  by mouth every 4 (four) hours as needed for cough.   hydrALAZINE 50 MG tablet Commonly known as: APRESOLINE Take 1 tablet (50 mg total) by mouth 3 (three) times daily.   latanoprost 0.005 % ophthalmic solution Commonly known as: XALATAN Place 1 drop into both eyes daily.   melatonin 5 MG Tabs Take 5 mg by mouth at bedtime.   NIFEdipine 90 MG 24 hr tablet Commonly known as: ADALAT  CC Take 1 tablet (90 mg total) by mouth daily. What changed:  medication strength how much to take   polyethylene glycol 17 g packet Commonly known as: MIRALAX / GLYCOLAX Take 17 g by mouth daily.   senna-docusate 8.6-50 MG tablet Commonly known as: Senokot-S Take 2 tablets by mouth at bedtime.   sertraline 50 MG tablet Commonly known as: ZOLOFT Take 50 mg by mouth daily.   Vitamin D High Potency 25 MCG (1000 UT) capsule Generic drug: Cholecalciferol Take 5,000 Units by mouth daily.   zinc sulfate 220 (50 Zn) MG capsule Take 1 capsule (220 mg total) by mouth daily.        Major procedures and Radiology Reports - PLEASE review detailed and final reports for all details, in brief -   CT Head Wo Contrast  Result Date: 11/30/2020 CLINICAL DATA:  87 year old female with altered mental status. EXAM: CT HEAD WITHOUT CONTRAST TECHNIQUE: Contiguous axial images were obtained from the base of the skull through the vertex without intravenous contrast. COMPARISON:  Head CT dated 05/29/2011. FINDINGS: Brain: Mild age-related atrophy and chronic microvascular ischemic changes. There is no acute intracranial hemorrhage. No mass effect or midline shift. No extra-axial fluid collection. Vascular: No hyperdense vessel or unexpected calcification. Skull: Normal. Negative for fracture or focal lesion. Sinuses/Orbits: There is complete opacification of the left maxillary sinus with chronic remodeling and destruction of the medial wall. Bilateral mastoid effusions. No air-fluid level. Other: None IMPRESSION: 1. No acute intracranial pathology. 2. Mild age-related atrophy and chronic microvascular ischemic changes. 3. Chronic left maxillary sinus disease. 4. Bilateral mastoid effusions. Electronically Signed   By: Anner Crete M.D.   On: 11/30/2020 16:20   DG Chest Portable 1 View  Result Date: 11/30/2020 CLINICAL DATA:  Altered mental status. EXAM: PORTABLE CHEST 1 VIEW COMPARISON:  Single-view of  the chest 12/30/2017 and CT chest 12/31/2017. FINDINGS: Trace bilateral pleural effusions and mild basilar atelectasis. Heart size is normal. Aortic atherosclerosis. No pneumothorax. No acute or focal bony abnormality. IMPRESSION: Trace bilateral pleural effusions and mild bibasilar atelectasis. Aortic Atherosclerosis (ICD10-I70.0). Electronically Signed   By: Inge Rise M.D.   On: 11/30/2020 15:42    Micro Results   Recent Results (from the past 240 hour(s))  Urine culture     Status: Abnormal   Collection Time: 11/30/20  3:11 PM   Specimen: Urine, Catheterized  Result Value Ref Range Status   Specimen Description   Final    URINE, CATHETERIZED Performed at [facility], [address], [place], [place] [address]    Special Requests   Final    NONE Performed at [facility], [address] [place], [place] [address]    Culture (A)  Final    <10,000 COLONIES/mL INSIGNIFICANT GROWTH Performed at [facility] [address] [place], [place] [address]    Report Status 12/02/2020 FINAL  Final  Resp Panel by RT-PCR (Flu A&B, Covid) Nasopharyngeal Swab     Status: Abnormal   Collection Time: 11/30/20  3:24 PM   Specimen: Nasopharyngeal  Swab; Nasopharyngeal(NP) swabs in vial transport medium  Result Value Ref Range Status   SARS Coronavirus 2 by RT PCR POSITIVE (A) NEGATIVE Final    Comment: RESULT CALLED TO, READ BACK BY AND VERIFIED WITH: CRAWFORD,H ON 11/30/20 AT 1645 BY LOY,C (NOTE) SARS-CoV-2 target nucleic acids are DETECTED.  The SARS-CoV-2 RNA is generally detectable in upper respiratory specimens during the acute phase of infection. Positive results are indicative of the presence of the identified virus, but do not rule out bacterial infection or co-infection with other pathogens not detected by the test. Clinical correlation with patient history and other diagnostic information is necessary to determine patient infection status. The expected result is  Negative.  Fact Sheet for Patients: EntrepreneurPulse.com.au  Fact Sheet for Healthcare Providers: IncredibleEmployment.be  This test is not yet approved or cleared by the [place] FDA and  has been authorized for detection and/or diagnosis of SARS-CoV-2 by FDA under an Emergency Use Authorization (EUA).  This EUA will remain in effect (meaning this test c an be used) for the duration of  the COVID-19 declaration under Section 564(b)(1) of the Act, 21 U.S.C. section 360bbb-3(b)(1), unless the authorization is terminated or revoked sooner.     Influenza A by PCR NEGATIVE NEGATIVE Final   Influenza B by PCR NEGATIVE NEGATIVE Final    Comment: (NOTE) The Xpert Xpress SARS-CoV-2/FLU/RSV plus assay is intended as an aid in the diagnosis of influenza from Nasopharyngeal swab specimens and should not be used as a sole basis for treatment. Nasal washings and aspirates are unacceptable for Xpert Xpress SARS-CoV-2/FLU/RSV testing.  Fact Sheet for Patients: EntrepreneurPulse.com.au  Fact Sheet for Healthcare Providers: IncredibleEmployment.be  This test is not yet approved or cleared by the [place] FDA and has been authorized for detection and/or diagnosis of SARS-CoV-2 by FDA under an Emergency Use Authorization (EUA). This EUA will remain in effect (meaning this test can be used) for the duration of the COVID-19 declaration under Section 564(b)(1) of the Act, 21 U.S.C. section 360bbb-3(b)(1), unless the authorization is terminated or revoked.  Performed at [facility], [address] [place], [place] [address]   Blood Culture (routine x 2)     Status: None (Preliminary result)   Collection Time: 11/30/20  6:07 PM   Specimen: Right Antecubital; Blood  Result Value Ref Range Status   Specimen Description RIGHT ANTECUBITAL  Final   Special Requests   Final    BOTTLES DRAWN AEROBIC AND ANAEROBIC Blood  Culture adequate volume   Culture   Final    NO GROWTH 2 DAYS Performed at [facility], [address] [place], [place] [address]    Report Status PENDING  Incomplete  Blood Culture (routine x 2)     Status: None (Preliminary result)   Collection Time: 11/30/20  6:25 PM   Specimen: BLOOD RIGHT HAND  Result Value Ref Range Status   Specimen Description BLOOD RIGHT HAND  Final   Special Requests   Final    BOTTLES DRAWN AEROBIC AND ANAEROBIC Blood Culture results may not be optimal due to an inadequate volume of blood received in culture bottles   Culture   Final    NO GROWTH 2 DAYS Performed at [facility], [address], [place], [place] [address]    Report Status PENDING  Incomplete       Today   Subjective    Cindy Schultz today has no new concerns  No fever  Or chills   No Nausea, Vomiting or  Diarrhea No Hypoxia No Shob      Patient has been seen and examined prior to discharge   Objective   Blood pressure (!) 186/55, pulse (!) 53, temperature 97.8 F (36.6 C), temperature source Oral, resp. rate 12, height _0  (1.626 m), weight 61.4 kg, SpO2 100 %.   Intake/Output Summary (Last 24 hours) at 12/02/2020 1159 Last data filed at 12/02/2020 1165 Gross per 24 hour  Intake --  Output 800 ml  Net -800 ml    Exam Gen:- Awake Alert, no acute distress , speaking in sentences,, cooperative HEENT:- [place].AT, No sclera icterus, HOH Neck-Supple Neck,No JVD,. Lungs-  CTAB , fair symmetrical air movement CV- S1, S2 normal, regular Abd-  +ve B.Sounds, Abd Soft, No tenderness,    Extremity/Skin:- No  edema, pedal pulses present Psych-underlying cognitive and memory deficits Neuro-no new focal deficits, no tremors   Data Review   CBC w Diff:  Lab Results  Component Value Date   WBC 7.5 12/02/2020   HGB 9.1 (L) 12/02/2020   HGB 11.3 06/28/2017   HCT 28.8 (L) 12/02/2020   HCT 35.9 06/28/2017   PLT 257 12/02/2020   PLT 263 06/28/2017   LYMPHOPCT 34 12/02/2020    MONOPCT 16 12/02/2020   EOSPCT 2 12/02/2020   BASOPCT 0 12/02/2020    CMP:  Lab Results  Component Value Date   NA 138 12/02/2020   K 3.8 12/02/2020   CL 108 12/02/2020   CO2 24 12/02/2020   BUN 34 (H) 12/02/2020   CREATININE 1.40 (H) 12/02/2020   PROT 6.2 (L) 12/02/2020   ALBUMIN 3.3 (L) 12/02/2020   BILITOT 0.1 (L) 12/02/2020   ALKPHOS 60 12/02/2020   AST 20 12/02/2020   ALT 13 12/02/2020  .   Total Discharge time is about 33 minutes  Roxan Hockey M.D on 12/02/2020 at 11:59 AM  Go to [email or web address] -  for contact info  [facility] - Office  [phone]

## 2020-12-02 NOTE — TOC Transition Note (Signed)
Transition of Care [facility]) - CM/SW Discharge Note   Patient Details  Name: Cindy Schultz MRN: [id number] Date of Birth: 1924-04-01  [facility] CM/SW Contact:  Natasha Bence, LCSW Phone Number: 12/02/2020, 1:37 PM   Clinical Narrative:    CSW notified of patient's readiness for discharge. [facility] agreeable to take patient back and provide transportation. CSW faxed discharge summary and FL2. TOC signing off.    Final next level of care: Assisted Living Barriers to Discharge: Barriers Resolved   Patient Goals and CMS Choice Patient states their goals for this hospitalization and ongoing recovery are:: Return to ALF CMS Medicare.gov Compare Post Acute Care list provided to:: Patient Choice offered to / list presented to : Patient  Discharge Placement                Patient to be transferred to facility by: [facility] Name of family member notified: Roach,Joyce (Daughter)   [phone] Patient and family notified of of transfer: 12/02/20  Discharge Plan and Services                                     Social Determinants of Health (SDOH) Interventions     Readmission Risk Interventions No flowsheet data found.

## 2020-12-02 NOTE — Discharge Instructions (Signed)
1) You are strongly advised to isolate/quarantine for at least 10 days from the date of your diagnosis with COVID-19 infection--please always wear a mask if you have to go outside the house  2)Video/Virtual follow-up visit with primary care physician in about a week advised  3)Please take medications as prescribed

## 2020-12-02 NOTE — ED Notes (Signed)
Emokpae, MD at bedside 

## 2020-12-02 NOTE — ED Notes (Signed)
Vitals taken upon discharge with EMS HR 90, BP 142/72, 97% on RA, RR 17

## 2020-12-02 NOTE — ED Notes (Signed)
Denton Brick, MD messaged re: pts BP 164/52 to see if he wants to administer 100 mg Hydralazine

## 2020-12-02 NOTE — ED Notes (Signed)
This RN spoke with Levada Dy @ [facility] who reports she does not have questions on the pt, AVS faxed to [facility] Rn updated on discharge process

## 2020-12-02 NOTE — ED Notes (Signed)
Pt cleaned up by NT prior to discharge to facility

## 2020-12-02 NOTE — NC FL2 (Signed)
[place] LEVEL OF CARE SCREENING TOOL     IDENTIFICATION  Patient Name: Cindy Schultz Birthdate: 04-30-1924 Sex: female Admission Date (Current Location): 11/30/2020  [facility] and [place] Number:  [employer] and Address:  [facility] [address] [place]      Provider Number: [id number]  Attending Physician Name and Address:  Roxan Hockey, MD  Relative Name and Phone Number:  Annye English (Daughter)   [phone]    Current Level of Care: Other (Comment) (ALF) Recommended Level of Care: Assisted Living Facility Prior Approval Number:    Date Approved/Denied:   PASRR Number:    Discharge Plan: Other (Comment) (ALF)    Current Diagnoses: Patient Active Problem List   Diagnosis Date Noted   Acute metabolic encephalopathy 27/51/7001   AMS (altered mental status) 11/30/2020   COVID-19 virus infection 11/30/2020   Sepsis ([place]) 12/30/2017   Pressure injury of skin 12/30/2017   Cellulitis of left leg 12/30/2017   HCAP (healthcare-associated pneumonia) 12/30/2017   Glaucoma (increased eye pressure) 12/30/2017   Compression fracture of lumbar spine, non-traumatic, initial encounter ([place]) 12/17/2017   Back pain 12/12/2017   Abdominal pain 08/28/2017   Primary osteoarthritis of right hip 07/19/2017   Tear of right rotator cuff 07/19/2017   CKD (chronic kidney disease) stage 3, GFR 30-59 ml/min (HCC) 05/22/2017   Macrocytosis    Diverticulosis of large intestine with hemorrhage    Rectal bleed 05/19/2017   CKD (chronic kidney disease) stage 4, GFR 15-29 ml/min (HCC) 05/19/2017   LLQ pain 03/08/2017   Colon polyp 03/08/2017   Constipation 03/08/2017   Acute blood loss anemia 09/10/2014   GIB (gastrointestinal bleeding) 09/08/2014   Occlusion and stenosis of carotid artery without mention of cerebral infarction 12/14/2013   Osteoarthritis of left knee 04/23/2013   Thumb pain 10/22/2012   De Quervain's syndrome  (tenosynovitis) 10/22/2012   CMC DJD(carpometacarpal degenerative joint disease), localized primary 10/22/2012   CTS (carpal tunnel syndrome) 10/22/2012   Trigger thumb of right hand 10/22/2012   Pes anserinus bursitis of right knee 06/26/2012   OA (osteoarthritis) of knee 06/26/2012   Leg pain, left 06/26/2012   Radicular pain of left lower extremity 06/26/2012   Diverticulosis of colon with hemorrhage 09/22/2011   Syncopal episodes 09/20/2011   GI bleed 09/19/2011   HTN (hypertension) 09/19/2011   Anemia due to blood loss, acute 09/19/2011   Abnormality of gait 01/03/2011   Personal history of fall 12/27/2010   DISLOCATION CLOSED SHOULDER NEC 04/25/2010   ANSERINE BURSITIS, LEFT 02/13/2010   KNEE PAIN 12/22/2009   LEG PAIN, BILATERAL 12/22/2009   TOTAL KNEE FOLLOW-UP 12/22/2009   HIP PAIN 02/07/2009   LOW BACK PAIN 02/07/2009    Orientation RESPIRATION BLADDER Height & Weight     Self, Time, Situation, Place  Normal Incontinent Weight: 135 lb 5.8 oz (61.4 kg) Height:  5\' 4"  (162.6 cm)  BEHAVIORAL SYMPTOMS/MOOD NEUROLOGICAL BOWEL NUTRITION STATUS      Continent Diet (Diet regular Room service appropriate? Yes; Fluid consistency: Thin)  AMBULATORY STATUS COMMUNICATION OF NEEDS Skin   Limited Assist Verbally Normal                       Personal Care Assistance Level of Assistance  Bathing, Feeding, Dressing Bathing Assistance: Limited assistance Feeding assistance: Independent Dressing Assistance: Limited assistance     Functional Limitations Info    Sight Info: Adequate Hearing Info: Adequate Speech Info: Adequate  SPECIAL CARE FACTORS FREQUENCY                       Contractures Contractures Info: Not present    Additional Factors Info  Code Status, Allergies Code Status Info: DNR Allergies Info: Codeine, Aspirin, Pepto-bismol (bismuth Subsalicylate), Hydrocodone           Current Medications (12/02/2020):  This is the current hospital  active medication list Current Facility-Administered Medications  Medication Dose Route Frequency Provider Last Rate Last Admin   0.9 %  sodium chloride infusion   Intravenous Continuous [place] Brick, Courage, MD   Stopped at 12/02/20 1312   acetaminophen (TYLENOL) tablet 650 mg  650 mg Oral Q6H PRN Emokpae, Ejiroghene E, MD       ascorbic acid (VITAMIN C) tablet 500 mg  500 mg Oral Daily Emokpae, Ejiroghene E, MD   500 mg at 12/02/20 1158   donepezil (ARICEPT) tablet 5 mg  5 mg Oral QHS Emokpae, Courage, MD   5 mg at 12/01/20 2133   dorzolamide-timolol (COSOPT) 22.3-6.8 MG/ML ophthalmic solution 1 drop  1 drop Both Eyes BID Emokpae, Ejiroghene E, MD   1 drop at 12/02/20 1201   guaiFENesin-dextromethorphan (ROBITUSSIN DM) 100-10 MG/5ML syrup 10 mL  10 mL Oral Q4H PRN Emokpae, Ejiroghene E, MD       heparin injection 5,000 Units  5,000 Units Subcutaneous Q8H Emokpae, Ejiroghene E, MD   5,000 Units at 12/02/20 0616   labetalol (NORMODYNE) injection 10 mg  10 mg Intravenous Q4H PRN Emokpae, Courage, MD       latanoprost (XALATAN) 0.005 % ophthalmic solution 1 drop  1 drop Both Eyes Daily Emokpae, Ejiroghene E, MD   1 drop at 12/02/20 1201   melatonin tablet 6 mg  6 mg Oral QHS Emokpae, Ejiroghene E, MD   6 mg at 12/01/20 2133   NIFEdipine (PROCARDIA-XL/NIFEDICAL-XL) 24 hr tablet 60 mg  60 mg Oral Daily Emokpae, Courage, MD   60 mg at 12/02/20 1159   ondansetron (ZOFRAN) tablet 4 mg  4 mg Oral Q6H PRN Emokpae, Ejiroghene E, MD       Or   ondansetron (ZOFRAN) injection 4 mg  4 mg Intravenous Q6H PRN Emokpae, Ejiroghene E, MD       polyethylene glycol (MIRALAX / GLYCOLAX) packet 17 g  17 g Oral Daily PRN Emokpae, Ejiroghene E, MD       senna (SENOKOT) tablet 8.6 mg  8.6 mg Oral Daily Emokpae, Ejiroghene E, MD   8.6 mg at 12/02/20 1158   sertraline (ZOLOFT) tablet 50 mg  50 mg Oral Daily Emokpae, Ejiroghene E, MD   50 mg at 12/02/20 1159   zinc sulfate capsule 220 mg  220 mg Oral Daily Emokpae, Ejiroghene E,  MD   220 mg at 12/02/20 1158   Current Outpatient Medications  Medication Sig Dispense Refill   acetaminophen (TYLENOL) 500 MG tablet Take 500 mg by mouth every 6 (six) hours as needed (pain).      albuterol (VENTOLIN HFA) 108 (90 Base) MCG/ACT inhaler Inhale 2 puffs into the lungs every 6 (six) hours as needed for wheezing or shortness of breath. 1 each 2   Cholecalciferol (VITAMIN D HIGH POTENCY) 25 MCG (1000 UT) capsule Take 5,000 Units by mouth daily.     dorzolamide-timolol (COSOPT) 22.3-6.8 MG/ML ophthalmic solution Place 1 drop into both eyes 2 (two) times daily.       gabapentin (NEURONTIN) 100 MG capsule Take 100 mg by mouth  at bedtime.     guaiFENesin (MUCINEX) 600 MG 12 hr tablet Take 1 tablet (600 mg total) by mouth 2 (two) times daily for 10 days. 20 tablet 0   hydrALAZINE (APRESOLINE) 50 MG tablet Take 1 tablet (50 mg total) by mouth 3 (three) times daily. 90 tablet 2   latanoprost (XALATAN) 0.005 % ophthalmic solution Place 1 drop into both eyes daily.     Melatonin 5 MG TABS Take 5 mg by mouth at bedtime.     polyethylene glycol (MIRALAX / GLYCOLAX) packet Take 17 g by mouth daily.     senna-docusate (SENOKOT-S) 8.6-50 MG tablet Take 2 tablets by mouth at bedtime. 60 tablet 11   sertraline (ZOLOFT) 50 MG tablet Take 50 mg by mouth daily.     ascorbic acid (VITAMIN C) 500 MG tablet Take 1 tablet (500 mg total) by mouth daily. 30 tablet 2   donepezil (ARICEPT) 5 MG tablet Take 1 tablet (5 mg total) by mouth at bedtime.     guaiFENesin-dextromethorphan (ROBITUSSIN DM) 100-10 MG/5ML syrup Take 10 mLs by mouth every 4 (four) hours as needed for cough. 118 mL 0   NIFEdipine (ADALAT CC) 90 MG 24 hr tablet Take 1 tablet (90 mg total) by mouth daily. 90 tablet 2   zinc sulfate 220 (50 Zn) MG capsule Take 1 capsule (220 mg total) by mouth daily. 30 capsule 2     Discharge Medications: Please see discharge summary for a list of discharge medications.  Relevant Imaging  Results:  Relevant Lab Results:   Additional Information PT SSN: [id number]  Natasha Bence, LCSW

## 2020-12-02 NOTE — ED Notes (Signed)
Pt monitor reading afib, diastolic BP in 00V. EKG obtained, Dr. Maurene Capes made aware, no new orders at this time.

## 2020-12-02 NOTE — ED Notes (Signed)
Cindy Schultz, daughter updated on plan of care

## 2020-12-05 LAB — CULTURE, BLOOD (ROUTINE X 2)
Culture: NO GROWTH
Culture: NO GROWTH
Special Requests: ADEQUATE

## 2020-12-14 ENCOUNTER — Inpatient Hospital Stay (HOSPITAL_COMMUNITY)
Admission: EM | Admit: 2020-12-14 | Discharge: 2020-12-21 | DRG: 377 | Disposition: A | Discharge status: Skilled Nursing Facility | Payer: Medicare Other | Source: Skilled Nursing Facility | Attending: Internal Medicine | Admitting: Internal Medicine

## 2020-12-14 ENCOUNTER — Encounter (HOSPITAL_COMMUNITY): Payer: Self-pay

## 2020-12-14 ENCOUNTER — Observation Stay (HOSPITAL_COMMUNITY): Payer: Medicare Other

## 2020-12-14 ENCOUNTER — Other Ambulatory Visit: Payer: Self-pay

## 2020-12-14 DIAGNOSIS — I10 Essential (primary) hypertension: Secondary | ICD-10-CM | POA: Diagnosis present

## 2020-12-14 DIAGNOSIS — I129 Hypertensive chronic kidney disease with stage 1 through stage 4 chronic kidney disease, or unspecified chronic kidney disease: Secondary | ICD-10-CM | POA: Diagnosis present

## 2020-12-14 DIAGNOSIS — R509 Fever, unspecified: Secondary | ICD-10-CM

## 2020-12-14 DIAGNOSIS — Z85828 Personal history of other malignant neoplasm of skin: Secondary | ICD-10-CM

## 2020-12-14 DIAGNOSIS — K922 Gastrointestinal hemorrhage, unspecified: Secondary | ICD-10-CM | POA: Diagnosis present

## 2020-12-14 DIAGNOSIS — Z8249 Family history of ischemic heart disease and other diseases of the circulatory system: Secondary | ICD-10-CM | POA: Diagnosis not present

## 2020-12-14 DIAGNOSIS — Z8616 Personal history of COVID-19: Secondary | ICD-10-CM

## 2020-12-14 DIAGNOSIS — G9341 Metabolic encephalopathy: Secondary | ICD-10-CM | POA: Diagnosis present

## 2020-12-14 DIAGNOSIS — Z79899 Other long term (current) drug therapy: Secondary | ICD-10-CM

## 2020-12-14 DIAGNOSIS — E871 Hypo-osmolality and hyponatremia: Secondary | ICD-10-CM | POA: Diagnosis present

## 2020-12-14 DIAGNOSIS — Z833 Family history of diabetes mellitus: Secondary | ICD-10-CM

## 2020-12-14 DIAGNOSIS — Z825 Family history of asthma and other chronic lower respiratory diseases: Secondary | ICD-10-CM

## 2020-12-14 DIAGNOSIS — R6510 Systemic inflammatory response syndrome (SIRS) of non-infectious origin without acute organ dysfunction: Secondary | ICD-10-CM | POA: Diagnosis present

## 2020-12-14 DIAGNOSIS — E876 Hypokalemia: Secondary | ICD-10-CM | POA: Diagnosis not present

## 2020-12-14 DIAGNOSIS — Z888 Allergy status to other drugs, medicaments and biological substances status: Secondary | ICD-10-CM

## 2020-12-14 DIAGNOSIS — F0390 Unspecified dementia without behavioral disturbance: Secondary | ICD-10-CM | POA: Diagnosis present

## 2020-12-14 DIAGNOSIS — Z885 Allergy status to narcotic agent status: Secondary | ICD-10-CM | POA: Diagnosis not present

## 2020-12-14 DIAGNOSIS — N183 Chronic kidney disease, stage 3 unspecified: Secondary | ICD-10-CM | POA: Diagnosis present

## 2020-12-14 DIAGNOSIS — Z66 Do not resuscitate: Secondary | ICD-10-CM | POA: Diagnosis present

## 2020-12-14 DIAGNOSIS — N1832 Chronic kidney disease, stage 3b: Secondary | ICD-10-CM | POA: Diagnosis present

## 2020-12-14 DIAGNOSIS — Z515 Encounter for palliative care: Secondary | ICD-10-CM | POA: Diagnosis not present

## 2020-12-14 DIAGNOSIS — Z96652 Presence of left artificial knee joint: Secondary | ICD-10-CM | POA: Diagnosis present

## 2020-12-14 DIAGNOSIS — E860 Dehydration: Secondary | ICD-10-CM | POA: Diagnosis present

## 2020-12-14 DIAGNOSIS — Z823 Family history of stroke: Secondary | ICD-10-CM | POA: Diagnosis not present

## 2020-12-14 DIAGNOSIS — D649 Anemia, unspecified: Secondary | ICD-10-CM | POA: Diagnosis present

## 2020-12-14 DIAGNOSIS — K8020 Calculus of gallbladder without cholecystitis without obstruction: Secondary | ICD-10-CM | POA: Diagnosis present

## 2020-12-14 DIAGNOSIS — Z7189 Other specified counseling: Secondary | ICD-10-CM | POA: Diagnosis not present

## 2020-12-14 DIAGNOSIS — K5731 Diverticulosis of large intestine without perforation or abscess with bleeding: Principal | ICD-10-CM | POA: Diagnosis present

## 2020-12-14 LAB — I-STAT CHEM 8, ED
BUN: 33 mg/dL — ABNORMAL HIGH (ref 8–23)
Calcium, Ion: 1.16 mmol/L (ref 1.15–1.40)
Chloride: 104 mmol/L (ref 98–111)
Creatinine, Ser: 1.8 mg/dL — ABNORMAL HIGH (ref 0.44–1.00)
Glucose, Bld: 139 mg/dL — ABNORMAL HIGH (ref 70–99)
HCT: 26 % — ABNORMAL LOW (ref 36.0–46.0)
Hemoglobin: 8.8 g/dL — ABNORMAL LOW (ref 12.0–15.0)
Potassium: 4.4 mmol/L (ref 3.5–5.1)
Sodium: 133 mmol/L — ABNORMAL LOW (ref 135–145)
TCO2: 19 mmol/L — ABNORMAL LOW (ref 22–32)

## 2020-12-14 LAB — COMPREHENSIVE METABOLIC PANEL
ALT: 18 U/L (ref 0–44)
AST: 25 U/L (ref 15–41)
Albumin: 3.6 g/dL (ref 3.5–5.0)
Alkaline Phosphatase: 78 U/L (ref 38–126)
Anion gap: 9 (ref 5–15)
BUN: 39 mg/dL — ABNORMAL HIGH (ref 8–23)
CO2: 19 mmol/L — ABNORMAL LOW (ref 22–32)
Calcium: 8.6 mg/dL — ABNORMAL LOW (ref 8.9–10.3)
Chloride: 103 mmol/L (ref 98–111)
Creatinine, Ser: 1.61 mg/dL — ABNORMAL HIGH (ref 0.44–1.00)
GFR, Estimated: 29 mL/min — ABNORMAL LOW (ref >60)
Glucose, Bld: 143 mg/dL — ABNORMAL HIGH (ref 70–99)
Potassium: 4.3 mmol/L (ref 3.5–5.1)
Sodium: 131 mmol/L — ABNORMAL LOW (ref 135–145)
Total Bilirubin: 0.5 mg/dL (ref 0.3–1.2)
Total Protein: 6.6 g/dL (ref 6.5–8.1)

## 2020-12-14 LAB — TYPE AND SCREEN
ABO/RH(D): A POS
Antibody Screen: NEGATIVE

## 2020-12-14 LAB — POC OCCULT BLOOD, ED: Fecal Occult Bld: POSITIVE — AB

## 2020-12-14 LAB — CBC WITH DIFFERENTIAL/PLATELET
Abs Immature Granulocytes: 0.1 10*3/uL — ABNORMAL HIGH (ref 0.00–0.07)
Basophils Absolute: 0 10*3/uL (ref 0.0–0.1)
Basophils Relative: 0 %
Eosinophils Absolute: 0.1 10*3/uL (ref 0.0–0.5)
Eosinophils Relative: 1 %
HCT: 26.1 % — ABNORMAL LOW (ref 36.0–46.0)
Hemoglobin: 8.4 g/dL — ABNORMAL LOW (ref 12.0–15.0)
Immature Granulocytes: 1 %
Lymphocytes Relative: 5 %
Lymphs Abs: 0.6 10*3/uL — ABNORMAL LOW (ref 0.7–4.0)
MCH: 31.9 pg (ref 26.0–34.0)
MCHC: 32.2 g/dL (ref 30.0–36.0)
MCV: 99.2 fL (ref 80.0–100.0)
Monocytes Absolute: 1.4 10*3/uL — ABNORMAL HIGH (ref 0.1–1.0)
Monocytes Relative: 11 %
Neutro Abs: 10.3 10*3/uL — ABNORMAL HIGH (ref 1.7–7.7)
Neutrophils Relative %: 82 %
Platelets: 251 10*3/uL (ref 150–400)
RBC: 2.63 MIL/uL — ABNORMAL LOW (ref 3.87–5.11)
RDW: 15.8 % — ABNORMAL HIGH (ref 11.5–15.5)
WBC: 12.5 10*3/uL — ABNORMAL HIGH (ref 4.0–10.5)
nRBC: 0 % (ref 0.0–0.2)

## 2020-12-14 LAB — PROTIME-INR
INR: 1.1 (ref 0.8–1.2)
Prothrombin Time: 13.9 seconds (ref 11.4–15.2)

## 2020-12-14 LAB — LACTIC ACID, PLASMA: Lactic Acid, Venous: 1.2 mmol/L (ref 0.5–1.9)

## 2020-12-14 MED ORDER — PANTOPRAZOLE SODIUM 40 MG IV SOLR
40.0000 mg | Freq: Once | INTRAVENOUS | Status: AC
  Administered 2020-12-14: 40 mg via INTRAVENOUS
  Filled 2020-12-14: qty 40

## 2020-12-14 MED ORDER — ONDANSETRON HCL 4 MG PO TABS: 4.0000 mg | ORAL_TABLET | Freq: Four times a day (QID) | ORAL | Status: DC | PRN

## 2020-12-14 MED ORDER — ACETAMINOPHEN 325 MG PO TABS: 650.0000 mg | ORAL_TABLET | Freq: Four times a day (QID) | ORAL | Status: DC | PRN

## 2020-12-14 MED ORDER — SERTRALINE HCL 50 MG PO TABS
50.0000 mg | ORAL_TABLET | Freq: Every day | ORAL | Status: DC
  Administered 2020-12-15 – 2020-12-21 (×7): 50 mg via ORAL
  Filled 2020-12-14 (×7): qty 1

## 2020-12-14 MED ORDER — DONEPEZIL HCL 5 MG PO TABS
5.0000 mg | ORAL_TABLET | Freq: Every day | ORAL | Status: DC
  Administered 2020-12-14 – 2020-12-20 (×7): 5 mg via ORAL
  Filled 2020-12-14 (×7): qty 1

## 2020-12-14 MED ORDER — ONDANSETRON HCL 4 MG/2ML IJ SOLN: 4.0000 mg | Freq: Four times a day (QID) | INTRAMUSCULAR | Status: DC | PRN

## 2020-12-14 MED ORDER — METRONIDAZOLE 500 MG/100ML IV SOLN
500.0000 mg | Freq: Three times a day (TID) | INTRAVENOUS | Status: AC
  Administered 2020-12-14 – 2020-12-19 (×16): 500 mg via INTRAVENOUS
  Filled 2020-12-14 (×16): qty 100

## 2020-12-14 MED ORDER — POLYETHYLENE GLYCOL 3350 17 G PO PACK: 17.0000 g | PACK | Freq: Every day | ORAL | Status: DC | PRN

## 2020-12-14 MED ORDER — HYDRALAZINE HCL 25 MG PO TABS
50.0000 mg | ORAL_TABLET | Freq: Three times a day (TID) | ORAL | Status: DC
  Administered 2020-12-14 – 2020-12-21 (×20): 50 mg via ORAL
  Filled 2020-12-14 (×20): qty 2

## 2020-12-14 MED ORDER — PANTOPRAZOLE SODIUM 40 MG IV SOLR
40.0000 mg | Freq: Once | INTRAVENOUS | Status: AC
  Administered 2020-12-15: 40 mg via INTRAVENOUS
  Filled 2020-12-14: qty 40

## 2020-12-14 MED ORDER — MELATONIN 5 MG PO TABS
5.0000 mg | ORAL_TABLET | Freq: Every day | ORAL | Status: DC
  Filled 2020-12-14 (×2): qty 1

## 2020-12-14 MED ORDER — ACETAMINOPHEN 650 MG RE SUPP: 650.0000 mg | Freq: Four times a day (QID) | RECTAL | Status: DC | PRN

## 2020-12-14 MED ORDER — SODIUM CHLORIDE 0.9 % IV SOLN: INTRAVENOUS | Status: AC

## 2020-12-14 MED ORDER — SODIUM CHLORIDE 0.9 % IV BOLUS
500.0000 mL | Freq: Once | INTRAVENOUS | Status: AC
  Administered 2020-12-14: 500 mL via INTRAVENOUS

## 2020-12-14 MED ORDER — SENNOSIDES-DOCUSATE SODIUM 8.6-50 MG PO TABS
2.0000 | ORAL_TABLET | Freq: Every day | ORAL | Status: DC
  Administered 2020-12-16 – 2020-12-20 (×4): 2 via ORAL
  Filled 2020-12-14 (×5): qty 2

## 2020-12-14 MED ORDER — DORZOLAMIDE HCL-TIMOLOL MAL 2-0.5 % OP SOLN
1.0000 [drp] | Freq: Two times a day (BID) | OPHTHALMIC | Status: DC
  Administered 2020-12-14 – 2020-12-21 (×14): 1 [drp] via OPHTHALMIC
  Filled 2020-12-14: qty 10

## 2020-12-14 MED ORDER — MELATONIN 3 MG PO TABS
6.0000 mg | ORAL_TABLET | Freq: Every day | ORAL | Status: DC
  Administered 2020-12-14 – 2020-12-20 (×7): 6 mg via ORAL
  Filled 2020-12-14 (×7): qty 2

## 2020-12-14 MED ORDER — SODIUM CHLORIDE 0.9 % IV SOLN
1.0000 g | INTRAVENOUS | Status: AC
  Administered 2020-12-14 – 2020-12-19 (×6): 1 g via INTRAVENOUS
  Filled 2020-12-14 (×6): qty 10

## 2020-12-14 MED ORDER — SODIUM CHLORIDE 0.9 % IV BOLUS
1000.0000 mL | Freq: Once | INTRAVENOUS | Status: AC
  Administered 2020-12-14: 1000 mL via INTRAVENOUS

## 2020-12-14 NOTE — ED Triage Notes (Signed)
Patient to ED from [facility] for blood in stool noted this am x 1. Patient reportedly started dry heaving 20 minutes PTA and noted to have blood in spit with another episode of bloody stool. Upon arrival, patient continues to dry heave and grunt. Patient does not answer questions when asked.

## 2020-12-14 NOTE — ED Provider Notes (Signed)
[facility] EMERGENCY DEPARTMENT Provider Note   CSN: [id number] Arrival date & time: 12/14/20  1724     History Chief Complaint  Patient presents with   Rectal Bleeding    Cindy Schultz is a 87 y.o. female.  Patient with rectal bleeding.  She has been having maroon stools.  The history is provided by the patient and medical records. No language interpreter was used.  Rectal Bleeding Quality:  Maroon Amount:  Copious Duration: 1 day. Timing:  Constant Chronicity:  New Context: not anal fissures   Similar prior episodes: no   Relieved by:  Nothing Worsened by:  Nothing Associated symptoms: no abdominal pain       Past Medical History:  Diagnosis Date   Cancer [facility]) March 2016   Skin Cancer :  Right Cheek     SCC   Cancer [facility]) 2013   Skin Cancer  :  Anterior Neck   SCC   Carotid artery occlusion    Chronic kidney disease    Diverticulitis    DVT (deep venous thrombosis) ([place]) 07/14/10   LEA doppler    GI bleeding    2013/2016/2018: suspected diverticular bleeding   Glaucoma    Hearing loss    HTN (hypertension)    cholesterol   Lumbar spine pain    Lymphedema    Macular degeneration    Pain in joints    Precancerous changes of the cervix    lesions   Rectocele    SOB (shortness of breath) 04/04/2010   2D echo EF=>55%    Patient Active Problem List   Diagnosis Date Noted   Acute metabolic encephalopathy 41/08/129   AMS (altered mental status) 11/30/2020   COVID-19 virus infection 11/30/2020   Sepsis ([place]) 12/30/2017   Pressure injury of skin 12/30/2017   Cellulitis of left leg 12/30/2017   HCAP (healthcare-associated pneumonia) 12/30/2017   Glaucoma (increased eye pressure) 12/30/2017   Compression fracture of lumbar spine, non-traumatic, initial encounter ([place]) 12/17/2017   Back pain 12/12/2017   Abdominal pain 08/28/2017   Primary osteoarthritis of right hip 07/19/2017   Tear of right rotator cuff 07/19/2017   CKD (chronic kidney  disease) stage 3, GFR 30-59 ml/min (HCC) 05/22/2017   Macrocytosis    Diverticulosis of large intestine with hemorrhage    Rectal bleed 05/19/2017   CKD (chronic kidney disease) stage 4, GFR 15-29 ml/min (HCC) 05/19/2017   LLQ pain 03/08/2017   Colon polyp 03/08/2017   Constipation 03/08/2017   Acute blood loss anemia 09/10/2014   GIB (gastrointestinal bleeding) 09/08/2014   Occlusion and stenosis of carotid artery without mention of cerebral infarction 12/14/2013   Osteoarthritis of left knee 04/23/2013   Thumb pain 10/22/2012   De Quervain's syndrome (tenosynovitis) 10/22/2012   CMC DJD(carpometacarpal degenerative joint disease), localized primary 10/22/2012   CTS (carpal tunnel syndrome) 10/22/2012   Trigger thumb of right hand 10/22/2012   Pes anserinus bursitis of right knee 06/26/2012   OA (osteoarthritis) of knee 06/26/2012   Leg pain, left 06/26/2012   Radicular pain of left lower extremity 06/26/2012   Diverticulosis of colon with hemorrhage 09/22/2011   Syncopal episodes 09/20/2011   GI bleed 09/19/2011   HTN (hypertension) 09/19/2011   Anemia due to blood loss, acute 09/19/2011   Abnormality of gait 01/03/2011   Personal history of fall 12/27/2010   DISLOCATION CLOSED SHOULDER NEC 04/25/2010   ANSERINE BURSITIS, LEFT 02/13/2010   KNEE PAIN 12/22/2009   LEG PAIN, BILATERAL 12/22/2009  TOTAL KNEE FOLLOW-UP 12/22/2009   HIP PAIN 02/07/2009   LOW BACK PAIN 02/07/2009    Past Surgical History:  Procedure Laterality Date   ABDOMINAL HYSTERECTOMY     appendix     bladder tac     COLONOSCOPY  09/20/2011   Dr. Carol Ada: pandiverticulosis, hemorrhoids. ascending colon polyp not removed in setting of active bleeding.   COLONOSCOPY N/A 04/10/2017   Dr. [name]: three 4-6 mm polyps in ascending colon and cecum, s/p removal. tubular adenomas. diverticulosis in entire examined colon.    corneal erosion     cataracts    elevated metatarsal arch     fallen uterus      hemmorrhoidectomy     IR KYPHO LUMBAR INC FX REDUCE BONE BX UNI/BIL CANNULATION INC/IMAGING  12/17/2017   left total knee replacement  05/02/04   [facility]   met osteostomy     morton's nueroma     OVARIAN CYST SURGERY     RECTOCELE REPAIR     rupture rt groin     salk     SKIN CANCER EXCISION Right March 2016  2013   Cheek -  [facility]  and  Anterior Neck   tracheotomy       OB History   No obstetric history on file.     Family History  Problem Relation Age of Onset   Stroke Mother    Diabetes Mother    Heart attack Father    COPD Paternal Grandfather    Dementia Sister    Colon cancer Neg Hx     Social History   Tobacco Use   Smoking status: Never   Smokeless tobacco: Never  Vaping Use   Vaping Use: Never used  Substance Use Topics   Alcohol use: No   Drug use: No    Home Medications Prior to Admission medications   Medication Sig Start Date End Date Taking? Authorizing Provider  acetaminophen (TYLENOL) 500 MG tablet Take 500 mg by mouth every 6 (six) hours as needed (pain).     [provider]  albuterol (VENTOLIN HFA) 108 (90 Base) MCG/ACT inhaler Inhale 2 puffs into the lungs every 6 (six) hours as needed for wheezing or shortness of breath. 12/02/20   Roxan Hockey, MD  ascorbic acid (VITAMIN C) 500 MG tablet Take 1 tablet (500 mg total) by mouth daily. 12/02/20   Roxan Hockey, MD  Cholecalciferol (VITAMIN D HIGH POTENCY) 25 MCG (1000 UT) capsule Take 5,000 Units by mouth daily.    [provider]  donepezil (ARICEPT) 5 MG tablet Take 1 tablet (5 mg total) by mouth at bedtime. 01/06/18   Sinda Du, MD  dorzolamide-timolol (COSOPT) 22.3-6.8 MG/ML ophthalmic solution Place 1 drop into both eyes 2 (two) times daily.      [provider]  gabapentin (NEURONTIN) 100 MG capsule Take 100 mg by mouth at bedtime.    [provider]  guaiFENesin-dextromethorphan (ROBITUSSIN DM) 100-10 MG/5ML syrup Take 10 mLs by mouth every 4 (four)  hours as needed for cough. 12/02/20   Roxan Hockey, MD  hydrALAZINE (APRESOLINE) 50 MG tablet Take 1 tablet (50 mg total) by mouth 3 (three) times daily. 12/02/20   Emokpae, Courage, MD  latanoprost (XALATAN) 0.005 % ophthalmic solution Place 1 drop into both eyes daily.    [provider]  Melatonin 5 MG TABS Take 5 mg by mouth at bedtime.    [provider]  NIFEdipine (ADALAT CC) 90 MG 24 hr tablet Take  1 tablet (90 mg total) by mouth daily. 12/02/20   Roxan Hockey, MD  polyethylene glycol (MIRALAX / GLYCOLAX) packet Take 17 g by mouth daily.    [provider]  senna-docusate (SENOKOT-S) 8.6-50 MG tablet Take 2 tablets by mouth at bedtime. 12/02/20 12/02/21  Roxan Hockey, MD  sertraline (ZOLOFT) 50 MG tablet Take 50 mg by mouth daily.    [provider]  zinc sulfate 220 (50 Zn) MG capsule Take 1 capsule (220 mg total) by mouth daily. 12/02/20   Roxan Hockey, MD  furosemide (LASIX) 20 MG tablet Take 2 tablets (40 mg total) by mouth daily for 5 days. Take 1 tablet by mouth once a day for 7 days Patient taking differently: Take 10 mg by mouth daily. 04/20/18 12/02/20  Carmin Muskrat, MD  ipratropium-albuterol (DUONEB) 0.5-2.5 (3) MG/3ML SOLN Take 3 mLs by nebulization every 6 (six) hours as needed. Patient not taking: Reported on 11/30/2020 01/06/18 12/02/20  Sinda Du, MD  losartan (COZAAR) 25 MG tablet Take 12.5 mg by mouth daily. 12/25/14 12/02/20  [provider]  potassium chloride SA (K-DUR,KLOR-CON) 20 MEQ tablet Take 20 mEq by mouth daily. Take 1 tablet by mouth once a day for 10 days  12/02/20  [provider]  promethazine (PHENERGAN) 12.5 MG tablet Take 1 tablet (12.5 mg total) by mouth every 6 (six) hours as needed for nausea or vomiting. Patient not taking: Reported on 11/30/2020 10/21/17 12/02/20  Carole Civil, MD    Allergies    Codeine, Aspirin, Pepto-bismol [bismuth subsalicylate], and Hydrocodone  Review of  Systems   Review of Systems  Constitutional:  Negative for appetite change and fatigue.  HENT:  Negative for congestion, ear discharge and sinus pressure.   Eyes:  Negative for discharge.  Respiratory:  Negative for cough.   Cardiovascular:  Negative for chest pain.  Gastrointestinal:  Positive for hematochezia. Negative for abdominal pain and diarrhea.       Rectal bleeding  Genitourinary:  Negative for frequency and hematuria.  Musculoskeletal:  Negative for back pain.  Skin:  Negative for rash.  Neurological:  Negative for seizures and headaches.  Psychiatric/Behavioral:  Negative for hallucinations.    Physical Exam Updated Vital Signs BP (!) 165/73   Pulse 81   Temp 100.3 F (37.9 C) (Rectal)   Resp (!) 27   Ht _0  (1.626 m)   Wt 59 kg   SpO2 97%   BMI 22.31 kg/m   Physical Exam Vitals and nursing note reviewed.  Constitutional:      Appearance: She is well-developed.  HENT:     Head: Normocephalic.     Nose: Nose normal.  Eyes:     General: No scleral icterus.    Conjunctiva/sclera: Conjunctivae normal.  Neck:     Thyroid: No thyromegaly.  Cardiovascular:     Rate and Rhythm: Normal rate and regular rhythm.     Heart sounds: No murmur heard.   No friction rub. No gallop.  Pulmonary:     Breath sounds: No stridor. No wheezing or rales.  Chest:     Chest wall: No tenderness.  Abdominal:     General: There is no distension.     Tenderness: There is no abdominal tenderness. There is no rebound.  Genitourinary:    Comments: Heme positive maroon stool Musculoskeletal:        General: Normal range of motion.     Cervical back: Neck supple.  Lymphadenopathy:     Cervical: No  cervical adenopathy.  Skin:    Findings: No erythema or rash.  Neurological:     Mental Status: She is alert and oriented to person, place, and time.     Motor: No abnormal muscle tone.     Coordination: Coordination normal.  Psychiatric:        Behavior: Behavior normal.    ED  Results / Procedures / Treatments   Labs (all labs ordered are listed, but only abnormal results are displayed) Labs Reviewed  CBC WITH DIFFERENTIAL/PLATELET - Abnormal; Notable for the following components:      Result Value   WBC 12.5 (*)    RBC 2.63 (*)    Hemoglobin 8.4 (*)    HCT 26.1 (*)    RDW 15.8 (*)    Neutro Abs 10.3 (*)    Lymphs Abs 0.6 (*)    Monocytes Absolute 1.4 (*)    Abs Immature Granulocytes 0.10 (*)    All other components within normal limits  COMPREHENSIVE METABOLIC PANEL - Abnormal; Notable for the following components:   Sodium 131 (*)    CO2 19 (*)    Glucose, Bld 143 (*)    BUN 39 (*)    Creatinine, Ser 1.61 (*)    Calcium 8.6 (*)    GFR, Estimated 29 (*)    All other components within normal limits  I-STAT CHEM 8, ED - Abnormal; Notable for the following components:   Sodium 133 (*)    BUN 33 (*)    Creatinine, Ser 1.80 (*)    Glucose, Bld 139 (*)    TCO2 19 (*)    Hemoglobin 8.8 (*)    HCT 26.0 (*)    All other components within normal limits  POC OCCULT BLOOD, ED - Abnormal; Notable for the following components:   Fecal Occult Bld POSITIVE (*)    All other components within normal limits  PROTIME-INR  OCCULT BLOOD X 1 CARD TO LAB, STOOL  HEMOGLOBIN AND HEMATOCRIT, BLOOD  URINALYSIS, ROUTINE W REFLEX MICROSCOPIC  TYPE AND SCREEN    EKG None  Radiology No results found.  Procedures Procedures   Medications Ordered in ED Medications  sodium chloride 0.9 % bolus 500 mL (0 mLs Intravenous Stopped 12/14/20 1809)  pantoprazole (PROTONIX) injection 40 mg (40 mg Intravenous Given 12/14/20 1758)  sodium chloride 0.9 % bolus 1,000 mL (1,000 mLs Intravenous New Bag/Given 12/14/20 1809)    ED Course  I have reviewed the triage vital signs and the nursing notes.  Pertinent labs & imaging results that were available during my care of the patient were reviewed by me and considered in my medical decision making (see chart for details).    CRITICAL CARE Performed by: Milton Ferguson Total critical care time: 40 minutes Critical care time was exclusive of separately billable procedures and treating other patients. Critical care was necessary to treat or prevent imminent or life-threatening deterioration. Critical care was time spent personally by me on the following activities: development of treatment plan with patient and/or surrogate as well as nursing, discussions with consultants, evaluation of patient's response to treatment, examination of patient, obtaining history from patient or surrogate, ordering and performing treatments and interventions, ordering and review of laboratory studies, ordering and review of radiographic studies, pulse oximetry and re-evaluation of patient's condition.  MDM Rules/Calculators/A&P                          Patient with rectal bleeding.  She  will be seen by GI tomorrow with medicine admission Final Clinical Impression(s) / ED Diagnoses Final diagnoses:  Fever    Rx / DC Orders ED Discharge Orders     None        Milton Ferguson, MD 12/16/20 1121

## 2020-12-14 NOTE — H&P (Addendum)
History and Physical    KYLIEANN EAGLES YWV:[id number] DOB: [birth year] DOA: 12/14/2020  PCP: Pcp, No   Patient coming from: [facility]  I have personally briefly reviewed patient's old medical records in [place]  Chief Complaint: Bloody stools  HPI: [facility] is a 87 y.o. female with medical history significant for hypertension, CKD 4, DVT, hard of hearing. Patient was brought to the ED from [facility] with reports of episode of blood in stool noted this morning   Per triage notes, prior to arrival patient started dry heaving, was noted to have blood in her sputum and another episode of bloody stool.   At the time of my evaluation, patient is awake, but grunting continuously.  Patient is hard of hearing.  I wrote down some simple questions, she opened her eyes and appeared to read them, but she did not answer the questions.  I talked to staff at the nursing home, reported today she had 2 episodes of bloody stools, no black stools.  No vomiting noted, no difficulty breathing noted.  Also today she coughed once and it was productive of a large volume of greenish-yellow sputum.  Also she runs continuously a lot.  She is very hard of hearing, but 9 out of 10 times she would respond to questions when they are written down.  Poor oral intake over the past several days, and over the past 2 days, patient has been weak requiring more assistance.  No noted change in mental status.  Also today it was noted that she was shaking.  Sent hospitalization 8/54-6/27 for metabolic encephalopathy secondary to COVID-19 infection and dehydration.  Patient was treated with a 3-day course of remdesivir.  Did not meet criteria for proximal low weight due to renal insufficiency.  ED Course: Temperature 100.3, heart rate 81-121, respiratory rate 24-31.  Blood pressure 150s to 170s.  Stool FOBT positive.  WBC 12.5.  Hemoglobin stable 8.4.  Creatinine 1.6.  1.5 L bolus given.  EDP to Dr. Laural Golden, will see  patient in the morning.  Review of Systems: Patient hard of hearing, not answering questions.  Past Medical History:  Diagnosis Date   Cancer [facility]) March 2016   Skin Cancer :  Right Cheek     SCC   Cancer [facility]) 2013   Skin Cancer  :  Anterior Neck   SCC   Carotid artery occlusion    Chronic kidney disease    Diverticulitis    DVT (deep venous thrombosis) ([place]) 07/14/10   LEA doppler    GI bleeding    2013/2016/2018: suspected diverticular bleeding   Glaucoma    Hearing loss    HTN (hypertension)    cholesterol   Lumbar spine pain    Lymphedema    Macular degeneration    Pain in joints    Precancerous changes of the cervix    lesions   Rectocele    SOB (shortness of breath) 04/04/2010   2D echo EF=>55%    Past Surgical History:  Procedure Laterality Date   ABDOMINAL HYSTERECTOMY     appendix     bladder tac     COLONOSCOPY  09/20/2011   Dr. Carol Ada: pandiverticulosis, hemorrhoids. ascending colon polyp not removed in setting of active bleeding.   COLONOSCOPY N/A 04/10/2017   Dr. [name]: three 4-6 mm polyps in ascending colon and cecum, s/p removal. tubular adenomas. diverticulosis in entire examined colon.    corneal erosion     cataracts  elevated metatarsal arch     fallen uterus     hemmorrhoidectomy     IR KYPHO LUMBAR INC FX REDUCE BONE BX UNI/BIL CANNULATION INC/IMAGING  12/17/2017   left total knee replacement  05/02/04   [facility]   met osteostomy     morton's nueroma     OVARIAN CYST SURGERY     RECTOCELE REPAIR     rupture rt groin     salk     SKIN CANCER EXCISION Right March 2016  2013   Cheek -  [facility]  and  Anterior Neck   tracheotomy       reports that she has never smoked. She has never used smokeless tobacco. She reports that she does not drink alcohol and does not use drugs.  Allergies  Allergen Reactions   Codeine Nausea And Vomiting   Aspirin     Cannot take due to Diverticulosis (bleeding side effects)   Pepto-Bismol [Bismuth  Subsalicylate]     Black stools   Hydrocodone Other (See Comments)    Unknown Reaction     Family History  Problem Relation Age of Onset   Stroke Mother    Diabetes Mother    Heart attack Father    COPD Paternal Grandfather    Dementia Sister    Colon cancer Neg Hx     Prior to Admission medications   Medication Sig Start Date End Date Taking? Authorizing Provider  acetaminophen (TYLENOL) 500 MG tablet Take 500 mg by mouth every 6 (six) hours as needed (pain).     [provider]  albuterol (VENTOLIN HFA) 108 (90 Base) MCG/ACT inhaler Inhale 2 puffs into the lungs every 6 (six) hours as needed for wheezing or shortness of breath. 12/02/20   Roxan Hockey, MD  ascorbic acid (VITAMIN C) 500 MG tablet Take 1 tablet (500 mg total) by mouth daily. 12/02/20   Roxan Hockey, MD  Cholecalciferol (VITAMIN D HIGH POTENCY) 25 MCG (1000 UT) capsule Take 5,000 Units by mouth daily.    [provider]  donepezil (ARICEPT) 5 MG tablet Take 1 tablet (5 mg total) by mouth at bedtime. 01/06/18   Sinda Du, MD  dorzolamide-timolol (COSOPT) 22.3-6.8 MG/ML ophthalmic solution Place 1 drop into both eyes 2 (two) times daily.      [provider]  gabapentin (NEURONTIN) 100 MG capsule Take 100 mg by mouth at bedtime.    [provider]  guaiFENesin-dextromethorphan (ROBITUSSIN DM) 100-10 MG/5ML syrup Take 10 mLs by mouth every 4 (four) hours as needed for cough. 12/02/20   Roxan Hockey, MD  hydrALAZINE (APRESOLINE) 50 MG tablet Take 1 tablet (50 mg total) by mouth 3 (three) times daily. 12/02/20   Wave Calzada, Courage, MD  latanoprost (XALATAN) 0.005 % ophthalmic solution Place 1 drop into both eyes daily.    [provider]  Melatonin 5 MG TABS Take 5 mg by mouth at bedtime.    [provider]  NIFEdipine (ADALAT CC) 90 MG 24 hr tablet Take 1 tablet (90 mg total) by mouth daily. 12/02/20   Roxan Hockey, MD  polyethylene glycol (MIRALAX /  GLYCOLAX) packet Take 17 g by mouth daily.    [provider]  senna-docusate (SENOKOT-S) 8.6-50 MG tablet Take 2 tablets by mouth at bedtime. 12/02/20 12/02/21  Roxan Hockey, MD  sertraline (ZOLOFT) 50 MG tablet Take 50 mg by mouth daily.    [provider]  zinc sulfate 220 (50 Zn) MG capsule Take 1 capsule (220 mg total)  by mouth daily. 12/02/20   Roxan Hockey, MD  furosemide (LASIX) 20 MG tablet Take 2 tablets (40 mg total) by mouth daily for 5 days. Take 1 tablet by mouth once a day for 7 days Patient taking differently: Take 10 mg by mouth daily. 04/20/18 12/02/20  Carmin Muskrat, MD  ipratropium-albuterol (DUONEB) 0.5-2.5 (3) MG/3ML SOLN Take 3 mLs by nebulization every 6 (six) hours as needed. Patient not taking: Reported on 11/30/2020 01/06/18 12/02/20  Sinda Du, MD  losartan (COZAAR) 25 MG tablet Take 12.5 mg by mouth daily. 12/25/14 12/02/20  [provider]  potassium chloride SA (K-DUR,KLOR-CON) 20 MEQ tablet Take 20 mEq by mouth daily. Take 1 tablet by mouth once a day for 10 days  12/02/20  [provider]  promethazine (PHENERGAN) 12.5 MG tablet Take 1 tablet (12.5 mg total) by mouth every 6 (six) hours as needed for nausea or vomiting. Patient not taking: Reported on 11/30/2020 10/21/17 12/02/20  Carole Civil, MD    Physical Exam: Limited as patient is not answering questions and is hard of hearing not following directions. Vitals:   12/14/20 1800 12/14/20 1801 12/14/20 1830 12/14/20 1900  BP: (!) 172/71  (!) 159/95 (!) 165/73  Pulse: 85  (!) 115 81  Resp: (!) 24  (!) 26 (!) 27  Temp:  100.3 F (37.9 C)    TempSrc:  Rectal    SpO2: 97%  97% 97%  Weight:      Height:        Constitutional: Grunting continuously, not answering questions. Vitals:   12/14/20 1800 12/14/20 1801 12/14/20 1830 12/14/20 1900  BP: (!) 172/71  (!) 159/95 (!) 165/73  Pulse: 85  (!) 115 81  Resp: (!) 24  (!) 26 (!) 27  Temp:  100.3 F (37.9 C)     TempSrc:  Rectal    SpO2: 97%  97% 97%  Weight:      Height:       Eyes: PERRL, lids and conjunctivae normal ENMT: Mucous membranes are markedly dry  neck: normal, supple, no masses, no thyromegaly Respiratory: clear to auscultation bilaterally, no wheezing, no crackles. Normal respiratory effort. No accessory muscle use.  Cardiovascular: Tachycardic, regular rate and rhythm, no murmurs / rubs / gallops. No extremity edema. 2+ pedal pulses. Abdomen: no tenderness, no masses palpated. No hepatosplenomegaly. Bowel sounds positive.  Musculoskeletal: no clubbing / cyanosis. No joint deformity upper and lower extremities. Good ROM, no contractures.  Skin: no rashes, lesions, ulcers. No induration Neurologic: Limited exam, no apparent facial asymmetry,.  Not following directions,  Psychiatric: Limited, awake alert.   Labs on Admission: I have personally reviewed following labs and imaging studies  CBC: Recent Labs  Lab 12/14/20 1751 12/14/20 1756  WBC 12.5*  --   NEUTROABS 10.3*  --   HGB 8.4* 8.8*  HCT 26.1* 26.0*  MCV 99.2  --   PLT 251  --    Basic Metabolic Panel: Recent Labs  Lab 12/14/20 1751 12/14/20 1756  NA 131* 133*  K 4.3 4.4  CL 103 104  CO2 19*  --   GLUCOSE 143* 139*  BUN 39* 33*  CREATININE 1.61* 1.80*  CALCIUM 8.6*  --    Liver Function Tests: Recent Labs  Lab 12/14/20 1751  AST 25  ALT 18  ALKPHOS 78  BILITOT 0.5  PROT 6.6  ALBUMIN 3.6   Coagulation Profile: Recent Labs  Lab 12/14/20 1754  INR 1.1    Radiological Exams on Admission:  No results found.  EKG: Independently reviewed.   Assessment/Plan Principal Problem:   GI bleed Active Problems:   HTN (hypertension)   CKD (chronic kidney disease) stage 4, GFR 15-29 ml/min (HCC)   SIRS (systemic inflammatory response syndrome) (HCC)    Acute GI bleed-bloody stools, no vomiting.  ??Reported episode of blood in sputum/spit prior to arrival in the ED.  Hemoglobin stable 8.4.  Close to  baseline ~ 9.  Not on anticoagulation antiplatelets.  Last colonoscopy 2018 by Dr. [name] - many small and large-mouthed diverticula were found in the entire colon and 3-  4 to 6 mm polyps. -Follow hemoglobin closely -Appears dehydrated, hemoglobin will likely drop with hydration -Clear liquid diet, n.p.o. midnight -IV Protonix 40 daily  SIRs- heart rate 81-121, tachypneic 24-31, leukocytosis of 12.5, T-max 100.3.  Nursing home reports weakness x2 days and poor oral intake. -Obtain UA - Chest xray- unremarkable -Check lactic acid - Obtain blood cultures and urine cultures -Will start IV antibiotics, Ceftriaxone and metronidazole -1.5 L bolus given, continue N/s 75cc/hr x 15hrs  Hyponatremia sodium 131.  Likely from poor oral intake -Hydrate  CKD 4-creatinine 1.6 close to baseline about 1.3-1.6.  HTN-  systolic 759F to 638G - Hold nifedipine and hydralazine for now with SIRS and GI bleed.  Recent COVID infection-was mostly asymptomatic.  Chest x-ray unremarkable.  Was never hypoxic.  Treated with 3-day course of remdesivir.  Was not a candidate for Paxlovid due to renal insufficiency.  Dementia-daughter reports baseline dementia, but feels patient is gradually declining - resume zoloft, aricept   DVT prophylaxis: Scds Code Status: DNR-Universal DNR form at bedside Family Communication: None at bedside.  Spoke to patient's daughter Annye English on the phone.  She is patient's only child and healthcare power of attorney.  She wants patient comfortable, no heroics or aggressive measures. Disposition Plan: ~ 2 days Consults called: None Admission status: Inpt, tele  I certify that at the point of admission it is my clinical judgment that the patient will require inpatient hospital care spanning beyond 2 midnights from the point of admission due to high intensity of service, high risk for further deterioration and high frequency of surveillance required.     [name]  MD [facility]  12/14/2020, 7:49 PM

## 2020-12-15 ENCOUNTER — Encounter (HOSPITAL_COMMUNITY): Payer: Self-pay | Admitting: Internal Medicine

## 2020-12-15 ENCOUNTER — Inpatient Hospital Stay (HOSPITAL_COMMUNITY): Payer: Medicare Other

## 2020-12-15 DIAGNOSIS — N183 Chronic kidney disease, stage 3 unspecified: Secondary | ICD-10-CM | POA: Diagnosis present

## 2020-12-15 DIAGNOSIS — N1832 Chronic kidney disease, stage 3b: Secondary | ICD-10-CM

## 2020-12-15 DIAGNOSIS — K922 Gastrointestinal hemorrhage, unspecified: Secondary | ICD-10-CM

## 2020-12-15 DIAGNOSIS — I10 Essential (primary) hypertension: Secondary | ICD-10-CM

## 2020-12-15 LAB — BPAM RBC
Blood Product Expiration Date: 202207222359
Blood Product Expiration Date: 202207262359
Unit Type and Rh: 6200
Unit Type and Rh: 6200

## 2020-12-15 LAB — CBC
HCT: 23.4 % — ABNORMAL LOW (ref 36.0–46.0)
Hemoglobin: 7.4 g/dL — ABNORMAL LOW (ref 12.0–15.0)
MCH: 32.3 pg (ref 26.0–34.0)
MCHC: 31.6 g/dL (ref 30.0–36.0)
MCV: 102.2 fL — ABNORMAL HIGH (ref 80.0–100.0)
Platelets: 193 10*3/uL (ref 150–400)
RBC: 2.29 MIL/uL — ABNORMAL LOW (ref 3.87–5.11)
RDW: 15.9 % — ABNORMAL HIGH (ref 11.5–15.5)
WBC: 8.5 10*3/uL (ref 4.0–10.5)
nRBC: 0 % (ref 0.0–0.2)

## 2020-12-15 LAB — TYPE AND SCREEN
ABO/RH(D): A POS
Antibody Screen: NEGATIVE
Unit division: 0
Unit division: 0

## 2020-12-15 LAB — BASIC METABOLIC PANEL
Anion gap: 8 (ref 5–15)
BUN: 35 mg/dL — ABNORMAL HIGH (ref 8–23)
CO2: 20 mmol/L — ABNORMAL LOW (ref 22–32)
Calcium: 8.1 mg/dL — ABNORMAL LOW (ref 8.9–10.3)
Chloride: 107 mmol/L (ref 98–111)
Creatinine, Ser: 1.6 mg/dL — ABNORMAL HIGH (ref 0.44–1.00)
GFR, Estimated: 30 mL/min — ABNORMAL LOW (ref >60)
Glucose, Bld: 126 mg/dL — ABNORMAL HIGH (ref 70–99)
Potassium: 4.1 mmol/L (ref 3.5–5.1)
Sodium: 135 mmol/L (ref 135–145)

## 2020-12-15 LAB — HEMOGLOBIN AND HEMATOCRIT, BLOOD
HCT: 25 % — ABNORMAL LOW (ref 36.0–46.0)
Hemoglobin: 7.9 g/dL — ABNORMAL LOW (ref 12.0–15.0)

## 2020-12-15 LAB — PREPARE RBC (CROSSMATCH)

## 2020-12-15 MED ORDER — SODIUM CHLORIDE 0.9% IV SOLUTION: Freq: Once | INTRAVENOUS | Status: AC

## 2020-12-15 MED ORDER — LABETALOL HCL 5 MG/ML IV SOLN: 10.0000 mg | INTRAVENOUS | Status: DC | PRN

## 2020-12-15 MED ORDER — FUROSEMIDE 10 MG/ML IJ SOLN
40.0000 mg | Freq: Once | INTRAMUSCULAR | Status: AC
  Administered 2020-12-15: 40 mg via INTRAVENOUS

## 2020-12-15 NOTE — Consult Note (Signed)
Referring Provider: Roxan Hockey, MD Primary Care Physician:  Pcp, No Primary Gastroenterologist:  Garfield Cornea, MD  Reason for Consultation:  rectal bleeding  HPI: Cindy Schultz is a 87 y.o. female with past medical history significant for recent hospitalization June 15 through June 17 for metabolic encephalopathy secondary to COVID infection and dehydration, hypertension, chronic kidney disease, DVT, hard of hearing presented to the emergency department from [facility] with reports of episodes of blood in the stool and blood in her sputum.  History was obtained from the records.  Patient is alert, grunting.  Reports of being severely hard of hearing but typically communicates when questions are written down.  Noted that she noted that she is not responding to [place] pressures at this time either.  Attending spoke to nursing home.  Patient had 2 episodes of bloody stool.  No melena.  No vomiting.  No reports of shortness of breath.  Productive cough earlier in the day, large volume of greenish-yellowish sputum.  She has had increased oral intake for several days, increased weakness requiring more assistance.  Prior to arrival she had dry heaving and noted to have blood in her spit with another episode of bloody stool.    Patient has a history of diverticular bleed requiring hospitalization in the past.  On presentation her hemoglobin was 8.4, had been 9.112 days prior.  Hemoglobin 7.4 today.  White blood cell count 12,500, sodium 131, glucose 143, creatinine 1.61 slightly above baseline, LFTs normal.  INR 1.1.  Blood and urine cultures pending.  Chest x-ray with no active disease.  Last reported stool 6 PM last night.  Colonoscopy October 2018: three 4-6 millimeter polyps in the ascending colon and cecum removed.  Tubular adenomas.  Diverticulosis of the entire examined colon noted.   Prior to Admission medications   Medication Sig Start Date End Date Taking? Authorizing Provider   acetaminophen (TYLENOL) 500 MG tablet Take 500 mg by mouth every 6 (six) hours as needed (pain).    Yes [provider]  albuterol (VENTOLIN HFA) 108 (90 Base) MCG/ACT inhaler Inhale 2 puffs into the lungs every 6 (six) hours as needed for wheezing or shortness of breath. 12/02/20  Yes Emokpae, Courage, MD  ascorbic acid (VITAMIN C) 500 MG tablet Take 1 tablet (500 mg total) by mouth daily. 12/02/20  Yes Emokpae, Courage, MD  Cholecalciferol (VITAMIN D HIGH POTENCY) 25 MCG (1000 UT) capsule Take 5,000 Units by mouth daily.   Yes [provider]  Cholecalciferol (VITAMIN D3) 125 MCG (5000 UT) TABS Take 1 tablet by mouth daily.   Yes [provider]  donepezil (ARICEPT) 5 MG tablet Take 1 tablet (5 mg total) by mouth at bedtime. 01/06/18  Yes Sinda Du, MD  dorzolamide-timolol (COSOPT) 22.3-6.8 MG/ML ophthalmic solution Place 1 drop into both eyes 2 (two) times daily.     Yes [provider]  gabapentin (NEURONTIN) 100 MG capsule Take 100 mg by mouth at bedtime.   Yes [provider]  guaiFENesin-dextromethorphan (ROBITUSSIN DM) 100-10 MG/5ML syrup Take 10 mLs by mouth every 4 (four) hours as needed for cough. 12/02/20  Yes Emokpae, Courage, MD  hydrALAZINE (APRESOLINE) 50 MG tablet Take 1 tablet (50 mg total) by mouth 3 (three) times daily. 12/02/20  Yes Emokpae, Courage, MD  latanoprost (XALATAN) 0.005 % ophthalmic solution Place 1 drop into both eyes daily.   Yes [provider]  Melatonin 5 MG TABS Take 5 mg by mouth at bedtime.   Yes [provider]  NIFEdipine (ADALAT CC) 90 MG 24 hr tablet Take 1 tablet (90 mg total) by mouth daily. 12/02/20  Yes Emokpae, Courage, MD  polyethylene glycol (MIRALAX / GLYCOLAX) packet Take 17 g by mouth daily.   Yes [provider]  senna-docusate (SENOKOT-S) 8.6-50 MG tablet Take 2 tablets by mouth at bedtime. 12/02/20 12/02/21 Yes Emokpae, Courage, MD  sertraline (ZOLOFT) 50 MG tablet Take 50  mg by mouth daily.   Yes [provider]  zinc sulfate 220 (50 Zn) MG capsule Take 1 capsule (220 mg total) by mouth daily. 12/02/20  Yes Emokpae, Courage, MD  furosemide (LASIX) 20 MG tablet Take 0.5 mg by mouth daily.    [provider]  potassium chloride SA (KLOR-CON) 20 MEQ tablet Take 20 mEq by mouth daily.    [provider]  ipratropium-albuterol (DUONEB) 0.5-2.5 (3) MG/3ML SOLN Take 3 mLs by nebulization every 6 (six) hours as needed. Patient not taking: Reported on 11/30/2020 01/06/18 12/02/20  Sinda Du, MD  losartan (COZAAR) 25 MG tablet Take 12.5 mg by mouth daily. 12/25/14 12/02/20  [provider]  promethazine (PHENERGAN) 12.5 MG tablet Take 1 tablet (12.5 mg total) by mouth every 6 (six) hours as needed for nausea or vomiting. Patient not taking: Reported on 11/30/2020 10/21/17 12/02/20  Carole Civil, MD    Current Facility-Administered Medications  Medication Dose Route Frequency Provider Last Rate Last Admin   0.9 %  sodium chloride infusion   Intravenous Continuous Emokpae, Ejiroghene E, MD 75 mL/hr at 12/15/20 0546 Infusion Verify at 12/15/20 0546   acetaminophen (TYLENOL) tablet 650 mg  650 mg Oral Q6H PRN Emokpae, Ejiroghene E, MD       Or   acetaminophen (TYLENOL) suppository 650 mg  650 mg Rectal Q6H PRN Emokpae, Ejiroghene E, MD       cefTRIAXone (ROCEPHIN) 1 g in sodium chloride 0.9 % 100 mL IVPB  1 g Intravenous Q24H Emokpae, Ejiroghene E, MD   Stopped at 12/15/20 0647   donepezil (ARICEPT) tablet 5 mg  5 mg Oral QHS Emokpae, Ejiroghene E, MD   5 mg at 12/14/20 2142   dorzolamide-timolol (COSOPT) 22.3-6.8 MG/ML ophthalmic solution 1 drop  1 drop Both Eyes BID Emokpae, Ejiroghene E, MD   1 drop at 12/15/20 0847   hydrALAZINE (APRESOLINE) tablet 50 mg  50 mg Oral TID Emokpae, Ejiroghene E, MD   50 mg at 12/15/20 0846   melatonin tablet 6 mg  6 mg Oral QHS Emokpae, Ejiroghene E, MD   6 mg at 12/14/20 2142   metroNIDAZOLE (FLAGYL)  IVPB 500 mg  500 mg Intravenous Q8H Emokpae, Ejiroghene E, MD 100 mL/hr at 12/15/20 0610 500 mg at 12/15/20 0610   ondansetron (ZOFRAN) tablet 4 mg  4 mg Oral Q6H PRN Emokpae, Ejiroghene E, MD       Or   ondansetron (ZOFRAN) injection 4 mg  4 mg Intravenous Q6H PRN Emokpae, Ejiroghene E, MD       pantoprazole (PROTONIX) injection 40 mg  40 mg Intravenous Once Emokpae, Ejiroghene E, MD       polyethylene glycol (MIRALAX / GLYCOLAX) packet 17 g  17 g Oral Daily PRN Emokpae, Ejiroghene E, MD       senna-docusate (Senokot-S) tablet 2 tablet  2 tablet Oral QHS Emokpae, Ejiroghene E, MD       sertraline (ZOLOFT) tablet 50 mg  50 mg Oral Daily Emokpae, Ejiroghene E, MD   50 mg at 12/15/20 [phone]  Allergies as of 12/14/2020 - Review Complete 12/14/2020  Allergen Reaction Noted   Codeine Nausea And Vomiting    Aspirin  12/12/2017   Pepto-bismol [bismuth subsalicylate]  69/45/0388   Hydrocodone Other (See Comments)     Past Medical History:  Diagnosis Date   Cancer [facility]) March 2016   Skin Cancer :  Right Cheek     SCC   Cancer [facility]) 2013   Skin Cancer  :  Anterior Neck   SCC   Carotid artery occlusion    Chronic kidney disease    Diverticulitis    DVT (deep venous thrombosis) ([place]) 07/14/10   LEA doppler    GI bleeding    2013/2016/2018: suspected diverticular bleeding   Glaucoma    Hearing loss    HTN (hypertension)    cholesterol   Lumbar spine pain    Lymphedema    Macular degeneration    Pain in joints    Precancerous changes of the cervix    lesions   Rectocele    SOB (shortness of breath) 04/04/2010   2D echo EF=>55%    Past Surgical History:  Procedure Laterality Date   ABDOMINAL HYSTERECTOMY     appendix     bladder tac     COLONOSCOPY  09/20/2011   Dr. Carol Ada: pandiverticulosis, hemorrhoids. ascending colon polyp not removed in setting of active bleeding.   COLONOSCOPY N/A 04/10/2017   Dr. [name]: three 4-6 mm polyps in ascending colon and cecum, s/p removal.  tubular adenomas. diverticulosis in entire examined colon.    corneal erosion     cataracts    elevated metatarsal arch     fallen uterus     hemmorrhoidectomy     IR KYPHO LUMBAR INC FX REDUCE BONE BX UNI/BIL CANNULATION INC/IMAGING  12/17/2017   left total knee replacement  05/02/04   [place]   met osteostomy     morton's nueroma     OVARIAN CYST SURGERY     RECTOCELE REPAIR     rupture rt groin     salk     SKIN CANCER EXCISION Right March 2016  2013   Cheek -  [facility]  and  Anterior Neck   tracheotomy      Family History  Problem Relation Age of Onset   Stroke Mother    Diabetes Mother    Heart attack Father    COPD Paternal Grandfather    Dementia Sister    Colon cancer Neg Hx     Social History   Socioeconomic History   Marital status: Divorced    Spouse name: Not on file   Number of children: Not on file   Years of education: Not on file   Highest education level: Not on file  Occupational History   Not on file  Tobacco Use   Smoking status: Never   Smokeless tobacco: Never  Vaping Use   Vaping Use: Never used  Substance and Sexual Activity   Alcohol use: No   Drug use: No   Sexual activity: Never  Other Topics Concern   Not on file  Social History Narrative   Not on file   Social Determinants of Health   Financial Resource Strain: Not on file  Food Insecurity: Not on file  Transportation Needs: Not on file  Physical Activity: Not on file  Stress: Not on file  Social Connections: Not on file  Intimate Partner Violence: Not on file     ROS: Unobtainable  Physical Examination: Vital signs in last 24 hours: Temp:  [98.3 F (36.8 C)-100.3 F (37.9 C)] 98.3 F (36.8 C) (06/30 0212) Pulse Rate:  [81-121] 90 (06/30 0212) Resp:  [18-31] 18 (06/30 0212) BP: (100-172)/(62-110) 140/72 (06/30 0846) SpO2:  [97 %-99 %] 99 % (06/30 0212) Weight:  [59 kg-60.5 kg] 60.5 kg (06/29 2100) Last BM Date: 12/14/20  General: Elderly female, frail,  resting comfortably.  In no acute distress.  When awakened, she smiles.  Cannot communicate verbally.  She did not respond to my verbal communication.  With written questions, she does not appear to make any attempts to read or communicate.  Grunts continuously. Head: Normocephalic, atraumatic.   Eyes: Conjunctiva pink, no icterus. Mouth: Oropharyngeal mucosa moist and pink , no lesions erythema or exudate. Neck: Supple without thyromegaly, masses, or lymphadenopathy.  Lungs: Clear to auscultation bilaterally.  Heart: Regular rate and rhythm, no murmurs rubs or gallops.  Abdomen: Bowel sounds are normal, no hepatosplenomegaly or masses, no abdominal bruits or    hernia , no rebound or guarding.  Abdomen appears full.  Grunting increases with palpation especially in the lower left abdomen. Rectal: not performed Extremities: No lower extremity edema, clubbing, deformity.  Neuro: Alert and oriented x 4 , grossly normal neurologically.  Skin: Warm and dry, no rash or jaundice.   Psych: Alert and cooperative, normal mood and affect.        Intake/Output from previous day: 06/29 0701 - 06/30 0700 In: 1783.2 [I.V.:490.3; IV Piggyback:1292.9] Out: -  Intake/Output this shift: No intake/output data recorded.  Lab Results: CBC Recent Labs    12/14/20 1751 12/14/20 1756 12/14/20 2316 12/15/20 0459  WBC 12.5*  --   --  8.5  HGB 8.4* 8.8* 7.9* 7.4*  HCT 26.1* 26.0* 25.0* 23.4*  MCV 99.2  --   --  102.2*  PLT 251  --   --  193   BMET Recent Labs    12/14/20 1751 12/14/20 1756 12/15/20 0459  NA 131* 133* 135  K 4.3 4.4 4.1  CL 103 104 107  CO2 19*  --  20*  GLUCOSE 143* 139* 126*  BUN 39* 33* 35*  CREATININE 1.61* 1.80* 1.60*  CALCIUM 8.6*  --  8.1*   LFT Recent Labs    12/14/20 1751  BILITOT 0.5  ALKPHOS 78  AST 25  ALT 18  PROT 6.6  ALBUMIN 3.6    Lipase No results for input(s): LIPASE in the last 72 hours.  PT/INR Recent Labs    12/14/20 1754  LABPROT 13.9   INR 1.1      Imaging Studies: CT Head Wo Contrast  Result Date: 11/30/2020 CLINICAL DATA:  87 year old female with altered mental status. EXAM: CT HEAD WITHOUT CONTRAST TECHNIQUE: Contiguous axial images were obtained from the base of the skull through the vertex without intravenous contrast. COMPARISON:  Head CT dated 05/29/2011. FINDINGS: Brain: Mild age-related atrophy and chronic microvascular ischemic changes. There is no acute intracranial hemorrhage. No mass effect or midline shift. No extra-axial fluid collection. Vascular: No hyperdense vessel or unexpected calcification. Skull: Normal. Negative for fracture or focal lesion. Sinuses/Orbits: There is complete opacification of the left maxillary sinus with chronic remodeling and destruction of the medial wall. Bilateral mastoid effusions. No air-fluid level. Other: None IMPRESSION: 1. No acute intracranial pathology. 2. Mild age-related atrophy and chronic microvascular ischemic changes. 3. Chronic left maxillary sinus disease. 4. Bilateral mastoid effusions. Electronically Signed   By: Laren Everts.D.  On: 11/30/2020 16:20   DG CHEST PORT 1 VIEW  Result Date: 12/14/2020 CLINICAL DATA:  87 year old female with fever. EXAM: PORTABLE CHEST 1 VIEW COMPARISON:  Chest radiograph dated 12/30/2017. FINDINGS: No focal consolidation, pleural effusion, or pneumothorax. The cardiac silhouette is within limits. Atherosclerotic calcification of the aorta. Osteopenia with degenerative changes of the spine. No acute osseous pathology. IMPRESSION: No active disease. Electronically Signed   By: Anner Crete M.D.   On: 12/14/2020 20:31   DG Chest Portable 1 View  Result Date: 11/30/2020 CLINICAL DATA:  Altered mental status. EXAM: PORTABLE CHEST 1 VIEW COMPARISON:  Single-view of the chest 12/30/2017 and CT chest 12/31/2017. FINDINGS: Trace bilateral pleural effusions and mild basilar atelectasis. Heart size is normal. Aortic atherosclerosis. No  pneumothorax. No acute or focal bony abnormality. IMPRESSION: Trace bilateral pleural effusions and mild bibasilar atelectasis. Aortic Atherosclerosis (ICD10-I70.0). Electronically Signed   By: Inge Rise M.D.   On: 11/30/2020 15:42  [4 week]   Impression: 87 year old female presenting from [place] with a couple of episodes of blood in the stool.  She is also had associated dry heaving, diminished oral intake, progressive weakness.  On presentation temperature 100.3.  White blood cell count 12,500.  Hemoglobin stable at 8.4.  Heme positive stool.  Blood and urine cultures pending, antibiotics and way of ceftriaxone and metronidazole started due to SIRS.  Per H&P, daughter reports decline in dementia, once patient comfortable, no heroics or aggressive measures.  Rectal bleeding: 2-3 episodes in the past 24 hours.  Hemoglobin in the 9-10 range earlier this month when admitted with COVID.  Discharged hemoglobin 9.1.  On presentation hemoglobin 8.4, down to 7.4 today.  Last colonoscopy in 2018, couple of tubular adenomas removed, diverticulosis.  She may have diverticular bleed, cannot exclude ischemic colitis as it is difficult to get a history.  Appears to have some discomfort with abdominal exam based on increased grunting.  May benefit from CT imaging in light of exam and patient being unable to provide further information.  Plan: Continue antibiotic therapy. Monitor H&H closely, transfuse as needed. Consider CT abdomen pelvis with contrast  We would like to thank you for the opportunity to participate in the care of MARISABEL MACPHERSON.  Laureen Ochs. Bernarda Caffey [facility] [phone] 6/30/202211:13 AM    LOS: 1 day

## 2020-12-15 NOTE — TOC Initial Note (Signed)
Transition of Care [facility]) - Initial/Assessment Note    Patient Details  Name: Cindy Schultz MRN: [id number] Date of Birth: 1923/08/26  [facility]) CM/SW Contact:    Ihor Gully, LCSW Phone Number: 12/15/2020, 1:25 PM  Clinical Narrative:                 Patient from [facility]. Admitted for GI Bleed. Recently had COVID (diagnosed 6/15). Toilets herself, requires assistance with bathing, dressing. Transfers to wc independently, self propels. Will accept readmission to facility over weekend, prefers readmission on Saturday.    Expected Discharge Plan: (P) Assisted Living Barriers to Discharge: (P) Continued Medical Work up   Patient Goals and CMS Choice        Expected Discharge Plan and Services Expected Discharge Plan: (P) Assisted Living       Living arrangements for the past 2 months: Assisted Living Facility                                      Prior Living Arrangements/Services Living arrangements for the past 2 months: [place] Lives with:: Facility Resident Patient language and need for interpreter reviewed:: Yes Do you feel safe going back to the place where you live?: Yes      Need for Family Participation in Patient Care: Yes (Comment) Care giver support system in place?: Yes (comment) Current home services: DME Criminal Activity/Legal Involvement Pertinent to Current Situation/Hospitalization: No - Comment as needed  Activities of Daily Living Home Assistive Devices/Equipment: Eyeglasses ADL Screening (condition at time of admission) Patient's cognitive ability adequate to safely complete daily activities?: No Is the patient deaf or have difficulty hearing?: No Does the patient have difficulty seeing, even when wearing glasses/contacts?: Yes Does the patient have difficulty concentrating, remembering, or making decisions?: Yes Patient able to express need for assistance with ADLs?: No Does the patient have  difficulty dressing or bathing?: Yes Independently performs ADLs?: No Communication: Needs assistance Is this a change from baseline?: Pre-admission baseline Dressing (OT): Needs assistance Is this a change from baseline?: Pre-admission baseline Grooming: Dependent Is this a change from baseline?: Pre-admission baseline Feeding: Needs assistance Is this a change from baseline?: Pre-admission baseline Bathing: Dependent Is this a change from baseline?: Pre-admission baseline Toileting: Dependent Is this a change from baseline?: Pre-admission baseline In/Out Bed: Dependent Is this a change from baseline?: Pre-admission baseline Walks in Home: Needs assistance Is this a change from baseline?: Pre-admission baseline Does the patient have difficulty walking or climbing stairs?: Yes Weakness of Legs: Both Weakness of Arms/Hands: Both  Permission Sought/Granted                  Emotional Assessment     Affect (typically observed): Appropriate Orientation: : Oriented to Self, Oriented to Place, Oriented to  Time, Oriented to Situation Alcohol / Substance Use: Not Applicable    Admission diagnosis:  GI bleed [K92.2] Fever [R50.9] Patient Active Problem List   Diagnosis Date Noted   SIRS (systemic inflammatory response syndrome) ([place]) 40/98/1191   Acute metabolic encephalopathy 47/82/9562   AMS (altered mental status) 11/30/2020   COVID-19 virus infection 11/30/2020   Sepsis ([place]) 12/30/2017   Pressure injury of skin 12/30/2017   Cellulitis of left leg 12/30/2017   HCAP (healthcare-associated pneumonia) 12/30/2017   Glaucoma (increased eye pressure) 12/30/2017   Compression fracture of lumbar spine, non-traumatic, initial encounter ([place]) 12/17/2017  Back pain 12/12/2017   Abdominal pain 08/28/2017   Primary osteoarthritis of right hip 07/19/2017   Tear of right rotator cuff 07/19/2017   CKD (chronic kidney disease) stage 3, GFR 30-59 ml/min (HCC) 05/22/2017    Macrocytosis    Diverticulosis of large intestine with hemorrhage    Rectal bleed 05/19/2017   CKD (chronic kidney disease) stage 4, GFR 15-29 ml/min (HCC) 05/19/2017   LLQ pain 03/08/2017   Colon polyp 03/08/2017   Constipation 03/08/2017   Acute blood loss anemia 09/10/2014   GIB (gastrointestinal bleeding) 09/08/2014   Occlusion and stenosis of carotid artery without mention of cerebral infarction 12/14/2013   Osteoarthritis of left knee 04/23/2013   Thumb pain 10/22/2012   De Quervain's syndrome (tenosynovitis) 10/22/2012   CMC DJD(carpometacarpal degenerative joint disease), localized primary 10/22/2012   CTS (carpal tunnel syndrome) 10/22/2012   Trigger thumb of right hand 10/22/2012   Pes anserinus bursitis of right knee 06/26/2012   OA (osteoarthritis) of knee 06/26/2012   Leg pain, left 06/26/2012   Radicular pain of left lower extremity 06/26/2012   Diverticulosis of colon with hemorrhage 09/22/2011   Syncopal episodes 09/20/2011   GI bleed 09/19/2011   HTN (hypertension) 09/19/2011   Anemia due to blood loss, acute 09/19/2011   Abnormality of gait 01/03/2011   Personal history of fall 12/27/2010   DISLOCATION CLOSED SHOULDER NEC 04/25/2010   ANSERINE BURSITIS, LEFT 02/13/2010   KNEE PAIN 12/22/2009   LEG PAIN, BILATERAL 12/22/2009   TOTAL KNEE FOLLOW-UP 12/22/2009   HIP PAIN 02/07/2009   LOW BACK PAIN 02/07/2009   PCP:  Pcp, No Pharmacy:   [id number] - [place], [place] - [address] [place] [address] Phone: [phone] Fax: [place], [place] [address] [place] [address] [place] [address] Phone: [phone] Fax: [phone]     Social Determinants of Health (SDOH) Interventions    Readmission Risk Interventions No flowsheet data found.

## 2020-12-15 NOTE — Progress Notes (Signed)
Patient Demographics:    Cindy Schultz, is a 87 y.o. female, DOB - 1924/05/29, HKV:[id number]  Admit date - 12/14/2020   Admitting Physician Ejiroghene Arlyce Dice, MD  Outpatient Primary MD for the patient is Pcp, No  LOS - 1  Chief Complaint  Patient presents with   Rectal Bleeding        Subjective:    Cindy Schultz today has no fevers, no emesis,  No chest pain,   -Resting comfortably, poor historian    Assessment  & Plan :    Principal Problem:   GI bleed Active Problems:   HTN (hypertension)   CKD (chronic kidney disease), stage IIIB   SIRS (systemic inflammatory response syndrome) ([place])  Brief Summary:-  87 y.o. female with medical history significant for hypertension, CKD 3B, DVT , recent COVID-19 infection , chronic anemia and dementia and recurrent diverticulitis/GI bleed readmitted on 12/14/2020 with concerns for acute GI bleed bleeding  A/p  1)Acute GI bleed--- patient with painless rectal bleeding, suspect diverticular etiology -GI consult appreciated, GI requested CT abdomen and pelvis which demonstrates severe colonic diverticulosis and cholelithiasis -GI team call treatment options with patient's daughter Cindy Schultz, at this time family would like to avoid invasive procedures such as colonoscopy/EGD, etc.  They wish for comfort measures/noninvasive therapy.  They are agreeable to transfusion if needed.  -Last colonoscopy by Dr. [name] in 2018 showed multiple diverticula in the entire colon as well as polyps - 2)Dehydration and HypoNatremia--- resolved with IV fluids  3)CKD stage - III B -Renal function appears to be at baseline -renally adjust medications, avoid nephrotoxic agents / dehydration  / hypotension  4)HTN-continue hydralazine p.o. and as needed labetalol IV  5)SIRS--SIRS pathophysiology resolving, no further tachycardia or tachypnea and no fevers -Okay to continue  Rocephin and Flagyl mostly for intra-abdominal concerns -Lung findings on CT abdomen and pelvis may be due to recent COVID-19 infection  6)Dementia--- Per family patient has been having more cognitive and memory deficits lately -c/n  Aricept and sertraline  7)social/ethics--DNR/DNI,  patient's daughter Cindy Schultz at [phone] primary decision maker,  8) acute on chronic anemia--hemoglobin down to 7.4 from a baseline usually around 9--- due to #1 above, -Transfuse 1 unit of PRBC with Lasix in between -Check B12 and folate given elevated MCV    Disposition/Need for in-Hospital Stay- patient unable to be discharged at this time due to --SIRS requiring IV antibiotics and GI bleed with symptomatic anemia requiring transfusion*  Status is: Inpatient  Remains inpatient appropriate because: Please see disposition above  Disposition: The patient is from: ALF              Anticipated d/c is to: ALF [facility]              Anticipated d/c date is: 2 days              Patient currently is not medically stable to d/c. Barriers: Not Clinically Stable-   Code Status : -  Code Status: DNR   Family Communication:    -Patient's daughter Cindy Schultz is primary decision-maker  Consults  :  Gi  DVT Prophylaxis  :   - SCDs   SCDs Start: 12/14/20 2056    Lab Results  Component Value Date   PLT 193 12/15/2020    Inpatient Medications  Scheduled Meds:  donepezil  5 mg Oral QHS   dorzolamide-timolol  1 drop Both Eyes BID   hydrALAZINE  50 mg Oral TID   melatonin  6 mg Oral QHS   senna-docusate  2 tablet Oral QHS   sertraline  50 mg Oral Daily   Continuous Infusions:  sodium chloride 75 mL/hr at 12/15/20 1341   cefTRIAXone (ROCEPHIN)  IV Stopped (12/15/20 1937)   metronidazole 500 mg (12/15/20 1604)   PRN Meds:.acetaminophen **OR** acetaminophen, ondansetron **OR** ondansetron (ZOFRAN) IV, polyethylene glycol    Anti-infectives (From admission, onward)    Start      Dose/Rate Route Frequency Ordered Stop   12/14/20 2300  cefTRIAXone (ROCEPHIN) 1 g in sodium chloride 0.9 % 100 mL IVPB        1 g 200 mL/hr over 30 Minutes Intravenous Every 24 hours 12/14/20 2213     12/14/20 2300  metroNIDAZOLE (FLAGYL) IVPB 500 mg        500 mg 100 mL/hr over 60 Minutes Intravenous Every 8 hours 12/14/20 2213           Objective:   Vitals:   12/15/20 0212 12/15/20 0846 12/15/20 1404 12/15/20 1602  BP: 100/62 140/72 (!) 142/50 (!) 145/71  Pulse: 90  60   Resp: 18  16   Temp: 98.3 F (36.8 C)  98.3 F (36.8 C)   TempSrc:   Oral   SpO2: 99%     Weight:      Height:        Wt Readings from Last 3 Encounters:  12/14/20 60.5 kg  11/30/20 61.4 kg  04/20/18 61.4 kg     Intake/Output Summary (Last 24 hours) at 12/15/2020 1647 Last data filed at 12/15/2020 1452 Gross per 24 hour  Intake 2452.21 ml  Output --  Net 2452.21 ml     Physical Exam  Gen:- Awake Alert,  in no apparent distress  HEENT:- [place].AT, No sclera icterus, HOH Neck-Supple Neck,No JVD,.  Lungs-  CTAB , fair symmetrical air movement CV- S1, S2 normal, regular  Abd-  +ve B.Sounds, Abd Soft, No tenderness,    Extremity/Skin:- No  edema, pedal pulses present  Psych-baseline cognitive and memory deficits Neuro-generalized weakness, no new focal deficits, no tremors   Data Review:   Micro Results Recent Results (from the past 240 hour(s))  Culture, blood (routine x 2)     Status: None (Preliminary result)   Collection Time: 12/14/20  9:49 PM   Specimen: Left Antecubital; Blood  Result Value Ref Range Status   Specimen Description LEFT ANTECUBITAL  Final   Special Requests   Final    BOTTLES DRAWN AEROBIC AND ANAEROBIC Blood Culture adequate volume Performed at [facility], [address], [place], [place] [address]    Culture PENDING  Incomplete   Report Status PENDING  Incomplete  Culture, blood (routine x 2)     Status: None (Preliminary result)   Collection Time: 12/14/20 10:00  PM   Specimen: BLOOD LEFT HAND  Result Value Ref Range Status   Specimen Description BLOOD LEFT HAND  Final   Special Requests   Final    BOTTLES DRAWN AEROBIC AND ANAEROBIC Blood Culture adequate volume Performed at [facility], [address], [place], [place] [address]    Culture PENDING  Incomplete   Report Status PENDING  Incomplete    Radiology Reports CT ABDOMEN PELVIS WO CONTRAST  Result Date: 12/15/2020 CLINICAL DATA:  87 year old female with left lower quadrant abdominal pain and GI bleed. EXAM: CT ABDOMEN AND PELVIS WITHOUT CONTRAST TECHNIQUE: Multidetector CT imaging of the abdomen and pelvis was performed following the standard protocol without IV contrast. COMPARISON:  CT abdomen pelvis dated 08/28/2017. FINDINGS: Evaluation of this exam is limited in the absence of intravenous contrast. Lower chest: Small bilateral pleural effusions with associated partial compressive atelectasis of the lower lobes. Pneumonia is not excluded clinical correlation is recommended. There is coronary vascular calcification. No intra-abdominal free air or free fluid. Hepatobiliary: The liver is unremarkable. No intrahepatic biliary dilatation. Several small stones within the gallbladder. The gallbladder is mildly distended. No pericholecystic fluid or evidence of acute cholecystitis by CT. Ultrasound may provide better evaluation if there is high clinical concern for acute cholecystitis. Pancreas: Unremarkable. No pancreatic ductal dilatation or surrounding inflammatory changes. Spleen: Normal in size without focal abnormality. Adrenals/Urinary Tract: The adrenal glands are unremarkable. The kidneys, visualized ureters, and urinary bladder are unremarkable. Stomach/Bowel: There is severe sigmoid diverticulosis and diffuse colonic diverticula without active inflammatory changes. There is no bowel obstruction. The appendix is not visualized with certainty. No inflammatory changes identified in the right lower  quadrant. Vascular/Lymphatic: Moderate aortoiliac atherosclerotic disease. The IVC is unremarkable. No portal venous gas. There is no adenopathy. Reproductive: Hysterectomy. No adnexal masses. Other: None Musculoskeletal: Osteopenia with degenerative changes of the spine and hips. L3 vertebroplasty. No acute osseous pathology. IMPRESSION: 1. No acute intra-abdominal or pelvic pathology. 2. Severe colonic diverticulosis. No bowel obstruction. 3. Cholelithiasis. 4. Small bilateral pleural effusions with associated partial compressive atelectasis of the lower lobes. Pneumonia is not excluded clinical correlation is recommended. 5. Aortic Atherosclerosis (ICD10-I70.0). Electronically Signed   By: Anner Crete M.D.   On: 12/15/2020 16:36   CT Head Wo Contrast  Result Date: 11/30/2020 CLINICAL DATA:  87 year old female with altered mental status. EXAM: CT HEAD WITHOUT CONTRAST TECHNIQUE: Contiguous axial images were obtained from the base of the skull through the vertex without intravenous contrast. COMPARISON:  Head CT dated 05/29/2011. FINDINGS: Brain: Mild age-related atrophy and chronic microvascular ischemic changes. There is no acute intracranial hemorrhage. No mass effect or midline shift. No extra-axial fluid collection. Vascular: No hyperdense vessel or unexpected calcification. Skull: Normal. Negative for fracture or focal lesion. Sinuses/Orbits: There is complete opacification of the left maxillary sinus with chronic remodeling and destruction of the medial wall. Bilateral mastoid effusions. No air-fluid level. Other: None IMPRESSION: 1. No acute intracranial pathology. 2. Mild age-related atrophy and chronic microvascular ischemic changes. 3. Chronic left maxillary sinus disease. 4. Bilateral mastoid effusions. Electronically Signed   By: Anner Crete M.D.   On: 11/30/2020 16:20   DG CHEST PORT 1 VIEW  Result Date: 12/14/2020 CLINICAL DATA:  87 year old female with fever. EXAM: PORTABLE CHEST 1  VIEW COMPARISON:  Chest radiograph dated 12/30/2017. FINDINGS: No focal consolidation, pleural effusion, or pneumothorax. The cardiac silhouette is within limits. Atherosclerotic calcification of the aorta. Osteopenia with degenerative changes of the spine. No acute osseous pathology. IMPRESSION: No active disease. Electronically Signed   By: Anner Crete M.D.   On: 12/14/2020 20:31   DG Chest Portable 1 View  Result Date: 11/30/2020 CLINICAL DATA:  Altered mental status. EXAM: PORTABLE CHEST 1 VIEW COMPARISON:  Single-view of the chest 12/30/2017 and CT chest 12/31/2017. FINDINGS: Trace bilateral pleural effusions and mild basilar atelectasis. Heart size is normal. Aortic atherosclerosis. No pneumothorax. No acute or focal bony abnormality. IMPRESSION: Trace bilateral pleural  effusions and mild bibasilar atelectasis. Aortic Atherosclerosis (ICD10-I70.0). Electronically Signed   By: Inge Rise M.D.   On: 11/30/2020 15:42     CBC Recent Labs  Lab 12/14/20 1751 12/14/20 1756 12/14/20 2316 12/15/20 0459  WBC 12.5*  --   --  8.5  HGB 8.4* 8.8* 7.9* 7.4*  HCT 26.1* 26.0* 25.0* 23.4*  PLT 251  --   --  193  MCV 99.2  --   --  102.2*  MCH 31.9  --   --  32.3  MCHC 32.2  --   --  31.6  RDW 15.8*  --   --  15.9*  LYMPHSABS 0.6*  --   --   --   MONOABS 1.4*  --   --   --   EOSABS 0.1  --   --   --   BASOSABS 0.0  --   --   --     Chemistries  Recent Labs  Lab 12/14/20 1751 12/14/20 1756 12/15/20 0459  NA 131* 133* 135  K 4.3 4.4 4.1  CL 103 104 107  CO2 19*  --  20*  GLUCOSE 143* 139* 126*  BUN 39* 33* 35*  CREATININE 1.61* 1.80* 1.60*  CALCIUM 8.6*  --  8.1*  AST 25  --   --   ALT 18  --   --   ALKPHOS 78  --   --   BILITOT 0.5  --   --    ------------------------------------------------------------------------------------------------------------------ No results for input(s): CHOL, HDL, LDLCALC, TRIG, CHOLHDL, LDLDIRECT in the last 72 hours.  No results found for:  HGBA1C ------------------------------------------------------------------------------------------------------------------ No results for input(s): TSH, T4TOTAL, T3FREE, THYROIDAB in the last 72 hours.  Invalid input(s): FREET3 ------------------------------------------------------------------------------------------------------------------ No results for input(s): VITAMINB12, FOLATE, FERRITIN, TIBC, IRON, RETICCTPCT in the last 72 hours.  Coagulation profile Recent Labs  Lab 12/14/20 1754  INR 1.1    No results for input(s): DDIMER in the last 72 hours.  Cardiac Enzymes No results for input(s): CKMB, TROPONINI, MYOGLOBIN in the last 168 hours.  Invalid input(s): CK ------------------------------------------------------------------------------------------------------------------    Component Value Date/Time   BNP 415.0 (H) 04/20/2018 1534     Roxan Hockey M.D on 12/15/2020 at 4:47 PM  Go to [email or web address] - for contact info  [facility] - Office  [phone]

## 2020-12-16 ENCOUNTER — Encounter (HOSPITAL_COMMUNITY): Payer: Self-pay | Admitting: Internal Medicine

## 2020-12-16 DIAGNOSIS — Z7189 Other specified counseling: Secondary | ICD-10-CM

## 2020-12-16 DIAGNOSIS — Z515 Encounter for palliative care: Secondary | ICD-10-CM

## 2020-12-16 LAB — CBC
HCT: 32.7 % — ABNORMAL LOW (ref 36.0–46.0)
Hemoglobin: 10.8 g/dL — ABNORMAL LOW (ref 12.0–15.0)
MCH: 31.2 pg (ref 26.0–34.0)
MCHC: 33 g/dL (ref 30.0–36.0)
MCV: 94.5 fL (ref 80.0–100.0)
Platelets: 170 10*3/uL (ref 150–400)
RBC: 3.46 MIL/uL — ABNORMAL LOW (ref 3.87–5.11)
RDW: 17.8 % — ABNORMAL HIGH (ref 11.5–15.5)
WBC: 17 10*3/uL — ABNORMAL HIGH (ref 4.0–10.5)
nRBC: 0 % (ref 0.0–0.2)

## 2020-12-16 LAB — BPAM RBC
Blood Product Expiration Date: 202207222359
ISSUE DATE / TIME: 202206302143
Unit Type and Rh: 6200

## 2020-12-16 LAB — TYPE AND SCREEN
ABO/RH(D): A POS
Antibody Screen: NEGATIVE
Unit division: 0

## 2020-12-16 LAB — BASIC METABOLIC PANEL
Anion gap: 10 (ref 5–15)
BUN: 32 mg/dL — ABNORMAL HIGH (ref 8–23)
CO2: 19 mmol/L — ABNORMAL LOW (ref 22–32)
Calcium: 8 mg/dL — ABNORMAL LOW (ref 8.9–10.3)
Chloride: 108 mmol/L (ref 98–111)
Creatinine, Ser: 1.42 mg/dL — ABNORMAL HIGH (ref 0.44–1.00)
GFR, Estimated: 34 mL/min — ABNORMAL LOW (ref >60)
Glucose, Bld: 99 mg/dL (ref 70–99)
Potassium: 3.3 mmol/L — ABNORMAL LOW (ref 3.5–5.1)
Sodium: 137 mmol/L (ref 135–145)

## 2020-12-16 LAB — URINE CULTURE: Culture: NO GROWTH

## 2020-12-16 LAB — VITAMIN B12: Vitamin B-12: 381 pg/mL (ref 180–914)

## 2020-12-16 LAB — FOLATE: Folate: 15.8 ng/mL (ref >5.9)

## 2020-12-16 MED ORDER — POTASSIUM CHLORIDE 10 MEQ/100ML IV SOLN
10.0000 meq | INTRAVENOUS | Status: AC
  Administered 2020-12-16 (×4): 10 meq via INTRAVENOUS
  Filled 2020-12-16: qty 100

## 2020-12-16 MED ORDER — SODIUM CHLORIDE 0.9 % IV SOLN: INTRAVENOUS | Status: AC

## 2020-12-16 NOTE — Care Management Important Message (Signed)
Important Message  Patient Details  Name: Cindy Schultz MRN: [id number] Date of Birth: 20-Nov-1923   Medicare Important Message Given:  Yes     Tommy Medal 12/16/2020, 1:18 PM

## 2020-12-16 NOTE — Progress Notes (Addendum)
Subjective:  Patient alert. Does not verbally communicate.  Objective: Vital signs in last 24 hours: Temp:  [97.8 F (36.6 C)-98.8 F (37.1 C)] 98.1 F (36.7 C) (07/01 0303) Pulse Rate:  [60-82] 71 (07/01 0833) Resp:  [16-20] 18 (07/01 0303) BP: (142-176)/(50-78) 151/62 (07/01 0833) SpO2:  [95 %-99 %] 99 % (07/01 0303) Last BM Date: 12/16/20 General:   Alert,  NAD Head:  Normocephalic and atraumatic. Eyes:  Sclera clear, no icterus.  Abdomen:  Soft, nontender and nondistended.  Normal bowel sounds, without guarding, and without rebound.   Extremities:  Without clubbing, deformity or edema. Skin:  Intact without significant lesions or rashes. Psych:  Alert   Intake/Output from previous day: 06/30 0701 - 07/01 0700 In: 1036.6 [I.V.:618.6; Blood:318; IV Piggyback:100] Out: -  Intake/Output this shift: Total I/O In: 374.9 [I.V.:33.2; IV Piggyback:341.7] Out: -   Lab Results: CBC Recent Labs    12/14/20 1751 12/14/20 1756 12/14/20 2316 12/15/20 0459 12/16/20 0614  WBC 12.5*  --   --  8.5 17.0*  HGB 8.4*   < > 7.9* 7.4* 10.8*  HCT 26.1*   < > 25.0* 23.4* 32.7*  MCV 99.2  --   --  102.2* 94.5  PLT 251  --   --  193 170   < > = values in this interval not displayed.   BMET Recent Labs    12/14/20 1751 12/14/20 1756 12/15/20 0459 12/16/20 0614  NA 131* 133* 135 137  K 4.3 4.4 4.1 3.3*  CL 103 104 107 108  CO2 19*  --  20* 19*  GLUCOSE 143* 139* 126* 99  BUN 39* 33* 35* 32*  CREATININE 1.61* 1.80* 1.60* 1.42*  CALCIUM 8.6*  --  8.1* 8.0*   LFTs Recent Labs    12/14/20 1751  BILITOT 0.5  ALKPHOS 78  AST 25  ALT 18  PROT 6.6  ALBUMIN 3.6   No results for input(s): LIPASE in the last 72 hours. PT/INR Recent Labs    12/14/20 1754  LABPROT 13.9  INR 1.1      Imaging Studies: CT ABDOMEN PELVIS WO CONTRAST  Result Date: 12/15/2020 CLINICAL DATA:  87 year old female with left lower quadrant abdominal pain and GI bleed. EXAM: CT ABDOMEN AND PELVIS  WITHOUT CONTRAST TECHNIQUE: Multidetector CT imaging of the abdomen and pelvis was performed following the standard protocol without IV contrast. COMPARISON:  CT abdomen pelvis dated 08/28/2017. FINDINGS: Evaluation of this exam is limited in the absence of intravenous contrast. Lower chest: Small bilateral pleural effusions with associated partial compressive atelectasis of the lower lobes. Pneumonia is not excluded clinical correlation is recommended. There is coronary vascular calcification. No intra-abdominal free air or free fluid. Hepatobiliary: The liver is unremarkable. No intrahepatic biliary dilatation. Several small stones within the gallbladder. The gallbladder is mildly distended. No pericholecystic fluid or evidence of acute cholecystitis by CT. Ultrasound may provide better evaluation if there is high clinical concern for acute cholecystitis. Pancreas: Unremarkable. No pancreatic ductal dilatation or surrounding inflammatory changes. Spleen: Normal in size without focal abnormality. Adrenals/Urinary Tract: The adrenal glands are unremarkable. The kidneys, visualized ureters, and urinary bladder are unremarkable. Stomach/Bowel: There is severe sigmoid diverticulosis and diffuse colonic diverticula without active inflammatory changes. There is no bowel obstruction. The appendix is not visualized with certainty. No inflammatory changes identified in the right lower quadrant. Vascular/Lymphatic: Moderate aortoiliac atherosclerotic disease. The IVC is unremarkable. No portal venous gas. There is no adenopathy. Reproductive: Hysterectomy. No adnexal masses. Other: None  Musculoskeletal: Osteopenia with degenerative changes of the spine and hips. L3 vertebroplasty. No acute osseous pathology. IMPRESSION: 1. No acute intra-abdominal or pelvic pathology. 2. Severe colonic diverticulosis. No bowel obstruction. 3. Cholelithiasis. 4. Small bilateral pleural effusions with associated partial compressive atelectasis  of the lower lobes. Pneumonia is not excluded clinical correlation is recommended. 5. Aortic Atherosclerosis (ICD10-I70.0). Electronically Signed   By: Anner Crete M.D.   On: 12/15/2020 16:36   CT Head Wo Contrast  Result Date: 11/30/2020 CLINICAL DATA:  87 year old female with altered mental status. EXAM: CT HEAD WITHOUT CONTRAST TECHNIQUE: Contiguous axial images were obtained from the base of the skull through the vertex without intravenous contrast. COMPARISON:  Head CT dated 05/29/2011. FINDINGS: Brain: Mild age-related atrophy and chronic microvascular ischemic changes. There is no acute intracranial hemorrhage. No mass effect or midline shift. No extra-axial fluid collection. Vascular: No hyperdense vessel or unexpected calcification. Skull: Normal. Negative for fracture or focal lesion. Sinuses/Orbits: There is complete opacification of the left maxillary sinus with chronic remodeling and destruction of the medial wall. Bilateral mastoid effusions. No air-fluid level. Other: None IMPRESSION: 1. No acute intracranial pathology. 2. Mild age-related atrophy and chronic microvascular ischemic changes. 3. Chronic left maxillary sinus disease. 4. Bilateral mastoid effusions. Electronically Signed   By: Anner Crete M.D.   On: 11/30/2020 16:20   DG CHEST PORT 1 VIEW  Result Date: 12/14/2020 CLINICAL DATA:  87 year old female with fever. EXAM: PORTABLE CHEST 1 VIEW COMPARISON:  Chest radiograph dated 12/30/2017. FINDINGS: No focal consolidation, pleural effusion, or pneumothorax. The cardiac silhouette is within limits. Atherosclerotic calcification of the aorta. Osteopenia with degenerative changes of the spine. No acute osseous pathology. IMPRESSION: No active disease. Electronically Signed   By: Anner Crete M.D.   On: 12/14/2020 20:31   DG Chest Portable 1 View  Result Date: 11/30/2020 CLINICAL DATA:  Altered mental status. EXAM: PORTABLE CHEST 1 VIEW COMPARISON:  Single-view of the  chest 12/30/2017 and CT chest 12/31/2017. FINDINGS: Trace bilateral pleural effusions and mild basilar atelectasis. Heart size is normal. Aortic atherosclerosis. No pneumothorax. No acute or focal bony abnormality. IMPRESSION: Trace bilateral pleural effusions and mild bibasilar atelectasis. Aortic Atherosclerosis (ICD10-I70.0). Electronically Signed   By: Inge Rise M.D.   On: 11/30/2020 15:42  [2 weeks]   Assessment: 87 year old female presenting from [place] with couple episodes of blood in the stool, dry heaving, diminished oral intake, progressive weakness.  On presentation temperature 100.3.  White blood cell count 12,500.  Hemoglobin 8.4.  Heme positive stool.  Blood and urine cultures pending, on ceftriaxone and metronidazole due to SIRS.  Per H&P, daughter reports decline in dementia, wants the patient comfortable with no aggressive measures.  Patient confirmed this with Dr. [name] yesterday afternoon.  Rectal bleeding: 2-3 episodes prior to admission.  Hemoglobin 9-10 range earlier this month when admitted with COVID.  Discharge hemoglobin 9.1.  On presentation hemoglobin 8.4, declined to 7.4.  She received 1 unit of packed red blood cells with hemoglobin up to 10.8.  White blood cell count also up to 17,000.  Her last colonoscopy 2018, couple of tubular adenomas removed, diverticulosis.  Suspected diverticular bleed.  CT abdomen pelvis without contrast yesterday with no acute intra-abdominal abnormalities.  Bilateral small pleural effusions with associated partial compressive atelectasis noted.  Per nursing staff, patient had black loose stool this morning.    Plan: Supportive care. Follow H&H.  If she becomes transfusion dependent or persistent overt GI bleeding, can readdress with family regarding their  desires for any invasive procedures at that time.  As of right now there wishes to pursue comfort measures/noninvasive therapy. Please contact us with any concerns.  We will follow  peripherally.  Laureen Ochs. Bernarda Caffey [facility] [phone] 7/1/202212:15 PM    LOS: 2 days

## 2020-12-16 NOTE — Evaluation (Addendum)
Physical Therapy Evaluation Patient Details Name: ALEJANDRINA RAIMER MRN: [id number] DOB: 1924-02-02 Today's Date: 12/16/2020   History of Present Illness  Cindy Schultz is a 87 y.o. female with medical history significant for hypertension, CKD 4, DVT, hard of hearing.  Patient was brought to the ED from [facility] with reports of episode of blood in stool noted this morning   Per triage notes, prior to arrival patient started dry heaving, was noted to have blood in her sputum and another episode of bloody stool.    At the time of my evaluation, patient is awake, but grunting continuously.  Patient is hard of hearing.  I wrote down some simple questions, she opened her eyes and appeared to read them, but she did not answer the questions.   Clinical Impression  Patient presents in bed with daughter in the room. Patient required use of tactile, visual cues and a writing tablet for communication. Patient required mod assist for bed mobility and was able to support herself in a seated position at EOB. Patient required visual cues for use of RW for sit to stand with min/mod assist. Patient incontinent of stool when standing, able to transfer to commode with min assist before ambulating 4 feet at bed side while stand pivot transferring to a chair. Patient was left in chair with family presents - nursing staff notified. Patient will benefit from continued physical therapy in hospital and recommended venue below to increase strength, balance, endurance for safe ADLs and gait.     Follow Up Recommendations SNF;Other (comment) (return to ALF)    Equipment Recommendations  None recommended by PT    Recommendations for Other Services       Precautions / Restrictions Precautions Precautions: Fall Restrictions Weight Bearing Restrictions: No      Mobility  Bed Mobility Overal bed mobility: Needs Assistance Bed Mobility: Supine to Sit     Supine to sit: Mod assist;HOB elevated       Patient  Response: Flat affect  Transfers Overall transfer level: Needs assistance Equipment used: Rolling walker (2 wheeled) Transfers: Sit to/from [facility] Sit to Stand: Mod assist;Min assist Stand pivot transfers: Mod assist       General transfer comment: slow labored movements, required assistance to move walker, visual cues for communication  Ambulation/Gait Ambulation/Gait assistance: Mod assist Gait Distance (Feet): 4 Feet Assistive device: Rolling walker (2 wheeled) Gait Pattern/deviations: Decreased step length - right;Decreased step length - left;Decreased stride length;Narrow base of support;Trunk flexed Gait velocity: decreased   General Gait Details: limited to a few slow labored steps at bedside  Stairs            Wheelchair Mobility    Modified Rankin (Stroke Patients Only)       Balance Overall balance assessment: Needs assistance Sitting-balance support: Feet supported;Bilateral upper extremity supported Sitting balance-Leahy Scale: Poor Sitting balance - Comments: at EOB   Standing balance support: During functional activity;Bilateral upper extremity supported Standing balance-Leahy Scale: Poor Standing balance comment: using RW                             Pertinent Vitals/Pain Pain Assessment: Faces Faces Pain Scale: Hurts a little bit Pain Location: whole body Pain Intervention(s): Monitored during session;Repositioned    Home Living Family/patient expects to be discharged to:: Skilled nursing facility                 Additional Comments: patient uses  a WC    Prior Function Level of Independence: Needs assistance   Gait / Transfers Assistance Needed: assisted by ALF staff  ADL's / Homemaking Assistance Needed: Assited by ALF staff  Comments: living in ALF, usnig WC min assit for transfers     Hand Dominance   Dominant Hand: Right    Extremity/Trunk Assessment   Upper Extremity Assessment Upper  Extremity Assessment: Generalized weakness    Lower Extremity Assessment Lower Extremity Assessment: Generalized weakness    Cervical / Trunk Assessment Cervical / Trunk Assessment: Kyphotic  Communication   Communication: HOH  Cognition Arousal/Alertness: Lethargic Behavior During Therapy: Flat affect Overall Cognitive Status: History of cognitive impairments - at baseline                                        General Comments      Exercises     Assessment/Plan    PT Assessment Patient needs continued PT services  PT Problem List Decreased strength;Decreased activity tolerance;Decreased balance;Decreased mobility;Decreased coordination;Decreased cognition;Decreased knowledge of precautions;Decreased safety awareness       PT Treatment Interventions DME instruction;Gait training;Functional mobility training;Therapeutic activities;Therapeutic exercise;Balance training;Patient/family education    PT Goals (Current goals can be found in the Care Plan section)  Acute Rehab PT Goals Patient Stated Goal: return to ALF PT Goal Formulation: With patient/family Time For Goal Achievement: 12/23/20 Potential to Achieve Goals: Good    Frequency Min 3X/week   Barriers to discharge        Co-evaluation               AM-PAC PT "6 Clicks" Mobility  Outcome Measure Help needed turning from your back to your side while in a flat bed without using bedrails?: A Lot Help needed moving from lying on your back to sitting on the side of a flat bed without using bedrails?: A Lot Help needed moving to and from a bed to a chair (including a wheelchair)?: A Lot Help needed standing up from a chair using your arms (e.g., wheelchair or bedside chair)?: A Lot Help needed to walk in hospital room?: A Lot Help needed climbing 3-5 steps with a railing? : Total 6 Click Score: 11    End of Session   Activity Tolerance: Patient tolerated treatment well;Patient limited by  fatigue Patient left: in chair;with call bell/phone within reach;with chair alarm set;with family/visitor present Nurse Communication: Mobility status PT Visit Diagnosis: Other abnormalities of gait and mobility (R26.89);Unsteadiness on feet (R26.81);Muscle weakness (generalized) (M62.81)    Time: 8016-5537 PT Time Calculation (min) (ACUTE ONLY): 30 min   Charges:   PT Evaluation $PT Eval Moderate Complexity: 1 Mod PT Treatments $Therapeutic Activity: 23-37 mins      3:07 PM, 12/16/20 [name] Cousler SPT  3:07 PM, 12/16/20 Lonell Grandchild, MPT Physical Therapist with [facility] [phone] office [phone] mobile phone

## 2020-12-16 NOTE — Consult Note (Addendum)
Consultation Note Date: 12/16/2020   Patient Name: Cindy Schultz  DOB: [birth year]  MRN: [id number]  Age / Sex: 87 y.o., female  PCP: Pcp, No Referring Physician: Roxan Hockey, MD  Reason for Consultation: Establishing goals of care and Psychosocial/spiritual support  HPI/Patient Profile: 87 y.o. female  with past medical history of multiple GI bleeds suspected diverticular bleed in 2013/2016/2018, HTN, CKD 4, DVT 2012, HOH, arthritis, IR kyphos lumbar 2019, rectocele admitted on 12/14/2020 with acute GI bleed suspected diverticular etiology, GI consulted.   Clinical Assessment and Goals of Care: I have reviewed medical records including EPIC notes, labs and imaging, received report from RN, assessed the patient.  Cindy Schultz is lying quietly in bed.  She opens her eyes, and will briefly make but not keep eye contact.  She does not respond to voice or touch.  I do not believe that she can make her basic needs known.  There is no family at bedside at this time.    Call to daughter, Cindy Schultz to discuss diagnosis prognosis, Ila, EOL wishes, disposition and options.  I introduced Palliative Medicine as specialized medical care for people living with serious illness. It focuses on providing relief from the symptoms and stress of a serious illness. The goal is to improve quality of life for both the patient and the family.  We talk in detail about Mrs. Moffatt's acute health problems.  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 Linhares's health concerns.  We talked in detail about GI consult and the treatment plan.  We talked in detail about Mrs. Burgert's current ALF and Cindy Schultz's continued concerns for her ability to recover.    Hospice and Palliative Care services outpatient were explained and offered.  We talk in detail about hospice and palliative care.  At this point, Cindy Schultz is requesting  to "treat the treatable" (not full comfort care) hospice care.  Provider choice discussed, Cindy Schultz chooses [facility] [place].   Discussed the importance of continued conversation with family and the medical providers regarding overall plan of care and treatment options, ensuring decisions are within the context of the patient's values and GOCs.  Questions and concerns were addressed.   The family was encouraged to call with questions or concerns.  PMT will continue to support holistically.  Conference with attending, bedside nursing staff, transition of care team related to patient condition, needs, goals of care, disposition.  Return phone call from daughter who states that she would like to start with outpatient palliative services with hospice of [facility].  Team updated.  HCPOA HCPOA - daughter, Cindy Schultz.      SUMMARY OF RECOMMENDATIONS   Continue to treat the treatable but no CPR or intubation No further invasive testing or procedures. Anticipating need for short-term rehab/long-term care "Treat the treatable" hospice care with [facility] [place] Planning: DNR  Symptom Management:  Per hospitalist, no additional needs at this time.  Palliative Prophylaxis:  Frequent Pain Assessment,  Oral Care, and Turn Reposition  Additional Recommendations (Limitations, Scope, Preferences): Continue to treat the treatable but no invasive GI series  Psycho-social/Spiritual:  Desire for further Chaplaincy support:no Additional Recommendations: Caregiving  Support/Resources and Education on Hospice  Prognosis:  Unable to determine, based on outcomes.  6 months or less would not be surprising based on chronic illness burden, functional status.  Discharge Planning:  To be determined, based on outcomes.  Hopeful to return to [place] with outpatient palliative services to follow.       Primary Diagnoses: Present on Admission:   GI bleed  SIRS (systemic inflammatory response syndrome) (HCC)  HTN (hypertension)  CKD (chronic kidney disease), stage IIIB   I have reviewed the medical record, interviewed the patient and family, and examined the patient. The following aspects are pertinent.  Past Medical History:  Diagnosis Date   Cancer [facility]) March 2016   Skin Cancer :  Right Cheek     SCC   Cancer [facility]) 2013   Skin Cancer  :  Anterior Neck   SCC   Carotid artery occlusion    Chronic kidney disease    Diverticulitis    DVT (deep venous thrombosis) ([place]) 07/14/10   LEA doppler    GI bleeding    2013/2016/2018: suspected diverticular bleeding   Glaucoma    Hearing loss    HTN (hypertension)    cholesterol   Lumbar spine pain    Lymphedema    Macular degeneration    Pain in joints    Precancerous changes of the cervix    lesions   Rectocele    SOB (shortness of breath) 04/04/2010   2D echo EF=>55%   Social History   Socioeconomic History   Marital status: Divorced    Spouse name: Not on file   Number of children: Not on file   Years of education: Not on file   Highest education level: Not on file  Occupational History   Not on file  Tobacco Use   Smoking status: Never   Smokeless tobacco: Never  Vaping Use   Vaping Use: Never used  Substance and Sexual Activity   Alcohol use: No   Drug use: No   Sexual activity: Never  Other Topics Concern   Not on file  Social History Narrative   Not on file   Social Determinants of Health   Financial Resource Strain: Not on file  Food Insecurity: Not on file  Transportation Needs: Not on file  Physical Activity: Not on file  Stress: Not on file  Social Connections: Not on file   Family History  Problem Relation Age of Onset   Stroke Mother    Diabetes Mother    Heart attack Father    COPD Paternal Grandfather    Dementia Sister    Colon cancer Neg Hx    Scheduled Meds:  donepezil  5 mg Oral QHS   dorzolamide-timolol  1 drop Both Eyes  BID   hydrALAZINE  50 mg Oral TID   melatonin  6 mg Oral QHS   senna-docusate  2 tablet Oral QHS   sertraline  50 mg Oral Daily   Continuous Infusions:  sodium chloride 50 mL/hr at 12/16/20 0928   cefTRIAXone (ROCEPHIN)  IV Stopped (12/15/20 2349)   metronidazole 500 mg (12/16/20 0559)   potassium chloride 10 mEq (12/16/20 1216)   PRN Meds:.acetaminophen **OR** acetaminophen, labetalol, ondansetron **OR** ondansetron (ZOFRAN) IV, polyethylene glycol Medications Prior to Admission:  Prior to Admission medications  Medication Sig Start Date End Date Taking? Authorizing Provider  acetaminophen (TYLENOL) 500 MG tablet Take 500 mg by mouth every 6 (six) hours as needed (pain).    Yes [provider]  albuterol (VENTOLIN HFA) 108 (90 Base) MCG/ACT inhaler Inhale 2 puffs into the lungs every 6 (six) hours as needed for wheezing or shortness of breath. 12/02/20  Yes Emokpae, Courage, MD  ascorbic acid (VITAMIN C) 500 MG tablet Take 1 tablet (500 mg total) by mouth daily. 12/02/20  Yes Emokpae, Courage, MD  Cholecalciferol (VITAMIN D HIGH POTENCY) 25 MCG (1000 UT) capsule Take 5,000 Units by mouth daily.   Yes [provider]  Cholecalciferol (VITAMIN D3) 125 MCG (5000 UT) TABS Take 1 tablet by mouth daily.   Yes [provider]  donepezil (ARICEPT) 5 MG tablet Take 1 tablet (5 mg total) by mouth at bedtime. 01/06/18  Yes Sinda Du, MD  dorzolamide-timolol (COSOPT) 22.3-6.8 MG/ML ophthalmic solution Place 1 drop into both eyes 2 (two) times daily.     Yes [provider]  gabapentin (NEURONTIN) 100 MG capsule Take 100 mg by mouth at bedtime.   Yes [provider]  guaiFENesin-dextromethorphan (ROBITUSSIN DM) 100-10 MG/5ML syrup Take 10 mLs by mouth every 4 (four) hours as needed for cough. 12/02/20  Yes Emokpae, Courage, MD  hydrALAZINE (APRESOLINE) 50 MG tablet Take 1 tablet (50 mg total) by mouth 3 (three) times daily. 12/02/20  Yes Emokpae, Courage,  MD  latanoprost (XALATAN) 0.005 % ophthalmic solution Place 1 drop into both eyes daily.   Yes [provider]  Melatonin 5 MG TABS Take 5 mg by mouth at bedtime.   Yes [provider]  NIFEdipine (ADALAT CC) 90 MG 24 hr tablet Take 1 tablet (90 mg total) by mouth daily. 12/02/20  Yes Emokpae, Courage, MD  polyethylene glycol (MIRALAX / GLYCOLAX) packet Take 17 g by mouth daily.   Yes [provider]  senna-docusate (SENOKOT-S) 8.6-50 MG tablet Take 2 tablets by mouth at bedtime. 12/02/20 12/02/21 Yes Emokpae, Courage, MD  sertraline (ZOLOFT) 50 MG tablet Take 50 mg by mouth daily.   Yes [provider]  zinc sulfate 220 (50 Zn) MG capsule Take 1 capsule (220 mg total) by mouth daily. 12/02/20  Yes Emokpae, Courage, MD  furosemide (LASIX) 20 MG tablet Take 0.5 mg by mouth daily.    [provider]  potassium chloride SA (KLOR-CON) 20 MEQ tablet Take 20 mEq by mouth daily.    [provider]  ipratropium-albuterol (DUONEB) 0.5-2.5 (3) MG/3ML SOLN Take 3 mLs by nebulization every 6 (six) hours as needed. Patient not taking: Reported on 11/30/2020 01/06/18 12/02/20  Sinda Du, MD  losartan (COZAAR) 25 MG tablet Take 12.5 mg by mouth daily. 12/25/14 12/02/20  [provider]  promethazine (PHENERGAN) 12.5 MG tablet Take 1 tablet (12.5 mg total) by mouth every 6 (six) hours as needed for nausea or vomiting. Patient not taking: Reported on 11/30/2020 10/21/17 12/02/20  Carole Civil, MD   Allergies  Allergen Reactions   Codeine Nausea And Vomiting   Aspirin     Cannot take due to Diverticulosis (bleeding side effects)   Pepto-Bismol [Bismuth Subsalicylate]     Black stools   Hydrocodone Other (See Comments)    Unknown Reaction    Review of Systems  Unable to perform ROS: Age   Physical Exam Vitals and nursing note reviewed.  Constitutional:      General: She is not in acute distress.  Appearance: She is ill-appearing.   Cardiovascular:     Rate and Rhythm: Normal rate.  Pulmonary:     Effort: Pulmonary effort is normal. No respiratory distress.  Skin:    General: Skin is warm and dry.     Coloration: Skin is pale.  Neurological:     Mental Status: She is alert.     Comments: Does not respond to questions  Psychiatric:     Comments: Calm and cooperative     Vital Signs: BP (!) 151/62   Pulse 71   Temp 98.1 F (36.7 C)   Resp 18   Ht 5\' 4"  (1.626 m)   Wt 60.5 kg   SpO2 99%   BMI 22.89 kg/m  Pain Scale: Faces   Pain Score: 0-No pain   SpO2: SpO2: 99 % O2 Device:SpO2: 99 % O2 Flow Rate: .   IO: Intake/output summary:  Intake/Output Summary (Last 24 hours) at 12/16/2020 1245 Last data filed at 12/16/2020 5697 Gross per 24 hour  Intake 1411.45 ml  Output --  Net 1411.45 ml    LBM: Last BM Date: 12/16/20 Baseline Weight: Weight: 59 kg Most recent weight: Weight: 60.5 kg     Palliative Assessment/Data:   Flowsheet Rows    Flowsheet Row Most Recent Value  Intake Tab   Referral Department Hospitalist  Unit at Time of Referral Cardiac/Telemetry Unit  Palliative Care Primary Diagnosis Other (Comment)  Date Notified 12/15/20  Palliative Care Type New Palliative care  Reason for referral Clarify Goals of Care  Date of Admission 12/14/20  Date first seen by Palliative Care 12/16/20  # of days Palliative referral response time 1 Day(s)  # of days IP prior to Palliative referral 1  Clinical Assessment   Palliative Performance Scale Score 20%  Pain Max last 24 hours Not able to report  Pain Min Last 24 hours Not able to report  Dyspnea Max Last 24 Hours Not able to report  Dyspnea Min Last 24 hours Not able to report  Psychosocial & Spiritual Assessment   Palliative Care Outcomes        Time In: 1010 Time Out: 1120 Time Total: 70 minutes Greater than 50%  of this time was spent counseling and coordinating care related to the above assessment and plan.  Signed by: Drue Novel, NP   Please contact Palliative Medicine Team phone at [phone] for questions and concerns.  For individual provider: See Shea Evans

## 2020-12-16 NOTE — Plan of Care (Addendum)
  Problem: Acute Rehab PT Goals(only PT should resolve) Goal: Pt Will Go Supine/Side To Sit Outcome: Progressing Flowsheets (Taken 12/16/2020 1504) Pt will go Supine/Side to Sit: with minimal assist Goal: Patient Will Transfer Sit To/From Stand Outcome: Progressing Flowsheets (Taken 12/16/2020 1504) Patient will transfer sit to/from stand:  with minimal assist  with min guard assist Goal: Pt Will Transfer Bed To Chair/Chair To Bed Outcome: Progressing Flowsheets (Taken 12/16/2020 1504) Pt will Transfer Bed to Chair/Chair to Bed:  min guard assist  with min assist Goal: Pt Will Ambulate Outcome: Progressing Flowsheets (Taken 12/16/2020 1504) Pt will Ambulate:  10 feet  with minimal assist  with rolling walker    3:04 PM, 12/16/20 Jeneen Rinks Cousler SPT  3:07 PM, 12/16/20 Lonell Grandchild, MPT Physical Therapist with [facility] [phone] office [phone] mobile phone

## 2020-12-16 NOTE — Progress Notes (Signed)
Patient Demographics:    Cindy Schultz, is a 87 y.o. female, DOB - November 30, 1923, XBJ:[id number]  Admit date - 12/14/2020   Admitting Physician [name], MD  Outpatient Primary MD for the patient is Pcp, No  LOS - 2  Chief Complaint  Patient presents with   Rectal Bleeding        Subjective:    Cindy Schultz today has no fevers, no emesis,  No chest pain,   -Resting comfortably, poor historian,  confused and disoriented  -1 episode of blackish loose stool this a.m.  Assessment  & Plan :    Principal Problem:   GI bleed Active Problems:   HTN (hypertension)   CKD (chronic kidney disease), stage IIIB   SIRS (systemic inflammatory response syndrome) (HCC)  Brief Summary:-  86 y.o. female with medical history significant for hypertension, CKD 3B, DVT , recent COVID-19 infection , chronic anemia and dementia and recurrent diverticulitis/GI bleed readmitted on 12/14/2020 with concerns for acute GI bleed bleeding  A/p  1)Acute GI bleed--- patient with painless rectal bleeding, suspect diverticular etiology -GI consult appreciated, GI requested CT abdomen and pelvis which demonstrates severe colonic diverticulosis and cholelithiasis -GI team call treatment options with patient's daughter Cindy Schultz, at this time family would like to avoid invasive procedures such as colonoscopy/EGD, etc.  They wish for comfort measures/noninvasive therapy.  They are agreeable to transfusion if needed.  -Last colonoscopy by Dr. Gala Schultz in 2018 showed multiple diverticula in the entire colon as well as polyps - -1 episode of blackish loose stool this a.m. - 2)Dehydration and HypoNatremia/Hypokalemia--- resolved with IV fluids, replace potassium   3)CKD stage - III B -Renal function appears to be at baseline -renally adjust medications, avoid nephrotoxic agents / dehydration  / hypotension  4)HTN-continue  hydralazine p.o. and as needed labetalol IV  5)SIRS--SIRS pathophysiology resolving, no further tachycardia or tachypnea and no fevers -Okay to continue Rocephin and Flagyl mostly for intra-abdominal concerns -Lung findings on CT abdomen and pelvis may be due to recent COVID-19 infection  6)Dementia--- Per family patient has been having more cognitive and memory deficits lately -c/n  Aricept and sertraline  7)social/ethics--DNR/DNI,  patient's daughter Cindy Schultz at [phone] primary decision maker, -Palliative care consult appreciated  8) acute on chronic anemia--hemoglobin up to 10.8 from 7.4 after transfusion of 4 units of PRBC on 12/15/2020  -baseline usually around 9--- due to #1 above, -B12 and folate acid are not low  9) generalized weakness and deconditioning----physical therapy eval appreciated recommends SNF rehab  Disposition/Need for in-Hospital Stay- patient unable to be discharged at this time due to --SIRS requiring IV antibiotics and GI bleed with symptomatic anemia requiring transfusion*  Status is: Inpatient  Remains inpatient appropriate because: Please see disposition above  Disposition: The patient is from: ALF              Anticipated d/c is to: ALF [facility]              Anticipated d/c date is: 2 days              Patient currently is not medically stable to d/c. Barriers: Not Clinically Stable-   Code Status : -  Code Status: DNR   Family  Communication:    -Patient's daughter Cindy Schultz is primary decision-maker  Consults  :  Gi  DVT Prophylaxis  :   - SCDs   SCDs Start: 12/14/20 2056    Lab Results  Component Value Date   PLT 170 12/16/2020    Inpatient Medications  Scheduled Meds:  donepezil  5 mg Oral QHS   dorzolamide-timolol  1 drop Both Eyes BID   hydrALAZINE  50 mg Oral TID   melatonin  6 mg Oral QHS   senna-docusate  2 tablet Oral QHS   sertraline  50 mg Oral Daily   Continuous Infusions:  sodium chloride 40  mL/hr at 12/16/20 1420   cefTRIAXone (ROCEPHIN)  IV Stopped (12/15/20 2349)   metronidazole 500 mg (12/16/20 1503)   PRN Meds:.acetaminophen **OR** acetaminophen, labetalol, ondansetron **OR** ondansetron (ZOFRAN) IV, polyethylene glycol    Anti-infectives (From admission, onward)    Start     Dose/Rate Route Frequency Ordered Stop   12/14/20 2300  cefTRIAXone (ROCEPHIN) 1 g in sodium chloride 0.9 % 100 mL IVPB        1 g 200 mL/hr over 30 Minutes Intravenous Every 24 hours 12/14/20 2213     12/14/20 2300  metroNIDAZOLE (FLAGYL) IVPB 500 mg        500 mg 100 mL/hr over 60 Minutes Intravenous Every 8 hours 12/14/20 2213           Objective:   Vitals:   12/16/20 0303 12/16/20 0558 12/16/20 0833 12/16/20 1349  BP: (!) 176/74 (!) 157/62 (!) 151/62 (!) 149/68  Pulse: 82 79 71 62  Resp: 18     Temp: 98.1 F (36.7 C)   97.8 F (36.6 C)  TempSrc:    Oral  SpO2: 99%     Weight:      Height:        Wt Readings from Last 3 Encounters:  12/14/20 60.5 kg  11/30/20 61.4 kg  04/20/18 61.4 kg     Intake/Output Summary (Last 24 hours) at 12/16/2020 1832 Last data filed at 12/16/2020 1731 Gross per 24 hour  Intake 1556.55 ml  Output --  Net 1556.55 ml    Physical Exam  Gen:- Awake Alert,  in no apparent distress, not really talkative HEENT:- [place].AT, No sclera icterus, HOH Neck-Supple Neck,No JVD,.  Lungs-  CTAB , fair symmetrical air movement CV- S1, S2 normal, regular  Abd-  +ve B.Sounds, Abd Soft, No tenderness,    Extremity/Skin:- No  edema, pedal pulses present  Psych-baseline cognitive and memory deficits Neuro-generalized weakness, no new focal deficits, no tremors   Data Review:   Micro Results Recent Results (from the past 240 hour(s))  Culture, blood (routine x 2)     Status: None (Preliminary result)   Collection Time: 12/14/20  9:49 PM   Specimen: Left Antecubital; Blood  Result Value Ref Range Status   Specimen Description LEFT ANTECUBITAL  Final    Special Requests   Final    BOTTLES DRAWN AEROBIC AND ANAEROBIC Blood Culture adequate volume   Culture   Final    NO GROWTH 2 DAYS Performed at [facility], [address] [place], [place] [address]    Report Status PENDING  Incomplete  Culture, blood (routine x 2)     Status: None (Preliminary result)   Collection Time: 12/14/20 10:00 PM   Specimen: BLOOD LEFT HAND  Result Value Ref Range Status   Specimen Description BLOOD LEFT HAND  Final   Special Requests  Final    BOTTLES DRAWN AEROBIC AND ANAEROBIC Blood Culture adequate volume   Culture   Final    NO GROWTH 2 DAYS Performed at [facility], [address], [place], [place] [address]    Report Status PENDING  Incomplete  Culture, Urine     Status: None   Collection Time: 12/14/20 10:45 PM   Specimen: Urine, Clean Catch  Result Value Ref Range Status   Specimen Description   Final    URINE, CLEAN CATCH Performed at [facility], [address] [place], [place] [address]    Special Requests   Final    NONE Performed at [facility], [address], [place], [place] [address]    Culture   Final    NO GROWTH Performed at [facility], [place] [address], [place], [place] [address]    Report Status 12/16/2020 FINAL  Final    Radiology Reports CT ABDOMEN PELVIS WO CONTRAST  Result Date: 12/15/2020 CLINICAL DATA:  87 year old female with left lower quadrant abdominal pain and GI bleed. EXAM: CT ABDOMEN AND PELVIS WITHOUT CONTRAST TECHNIQUE: Multidetector CT imaging of the abdomen and pelvis was performed following the standard protocol without IV contrast. COMPARISON:  CT abdomen pelvis dated 08/28/2017. FINDINGS: Evaluation of this exam is limited in the absence of intravenous contrast. Lower chest: Small bilateral pleural effusions with associated partial compressive atelectasis of the lower lobes. Pneumonia is not excluded clinical correlation is recommended. There is coronary vascular calcification. No intra-abdominal  free air or free fluid. Hepatobiliary: The liver is unremarkable. No intrahepatic biliary dilatation. Several small stones within the gallbladder. The gallbladder is mildly distended. No pericholecystic fluid or evidence of acute cholecystitis by CT. Ultrasound may provide better evaluation if there is high clinical concern for acute cholecystitis. Pancreas: Unremarkable. No pancreatic ductal dilatation or surrounding inflammatory changes. Spleen: Normal in size without focal abnormality. Adrenals/Urinary Tract: The adrenal glands are unremarkable. The kidneys, visualized ureters, and urinary bladder are unremarkable. Stomach/Bowel: There is severe sigmoid diverticulosis and diffuse colonic diverticula without active inflammatory changes. There is no bowel obstruction. The appendix is not visualized with certainty. No inflammatory changes identified in the right lower quadrant. Vascular/Lymphatic: Moderate aortoiliac atherosclerotic disease. The IVC is unremarkable. No portal venous gas. There is no adenopathy. Reproductive: Hysterectomy. No adnexal masses. Other: None Musculoskeletal: Osteopenia with degenerative changes of the spine and hips. L3 vertebroplasty. No acute osseous pathology. IMPRESSION: 1. No acute intra-abdominal or pelvic pathology. 2. Severe colonic diverticulosis. No bowel obstruction. 3. Cholelithiasis. 4. Small bilateral pleural effusions with associated partial compressive atelectasis of the lower lobes. Pneumonia is not excluded clinical correlation is recommended. 5. Aortic Atherosclerosis (ICD10-I70.0). Electronically Signed   By: Anner Crete M.D.   On: 12/15/2020 16:36   CT Head Wo Contrast  Result Date: 11/30/2020 CLINICAL DATA:  87 year old female with altered mental status. EXAM: CT HEAD WITHOUT CONTRAST TECHNIQUE: Contiguous axial images were obtained from the base of the skull through the vertex without intravenous contrast. COMPARISON:  Head CT dated 05/29/2011. FINDINGS:  Brain: Mild age-related atrophy and chronic microvascular ischemic changes. There is no acute intracranial hemorrhage. No mass effect or midline shift. No extra-axial fluid collection. Vascular: No hyperdense vessel or unexpected calcification. Skull: Normal. Negative for fracture or focal lesion. Sinuses/Orbits: There is complete opacification of the left maxillary sinus with chronic remodeling and destruction of the medial wall. Bilateral mastoid effusions. No air-fluid level. Other: None IMPRESSION: 1. No acute intracranial pathology. 2. Mild age-related atrophy and  chronic microvascular ischemic changes. 3. Chronic left maxillary sinus disease. 4. Bilateral mastoid effusions. Electronically Signed   By: Anner Crete M.D.   On: 11/30/2020 16:20   DG CHEST PORT 1 VIEW  Result Date: 12/14/2020 CLINICAL DATA:  87 year old female with fever. EXAM: PORTABLE CHEST 1 VIEW COMPARISON:  Chest radiograph dated 12/30/2017. FINDINGS: No focal consolidation, pleural effusion, or pneumothorax. The cardiac silhouette is within limits. Atherosclerotic calcification of the aorta. Osteopenia with degenerative changes of the spine. No acute osseous pathology. IMPRESSION: No active disease. Electronically Signed   By: Anner Crete M.D.   On: 12/14/2020 20:31   DG Chest Portable 1 View  Result Date: 11/30/2020 CLINICAL DATA:  Altered mental status. EXAM: PORTABLE CHEST 1 VIEW COMPARISON:  Single-view of the chest 12/30/2017 and CT chest 12/31/2017. FINDINGS: Trace bilateral pleural effusions and mild basilar atelectasis. Heart size is normal. Aortic atherosclerosis. No pneumothorax. No acute or focal bony abnormality. IMPRESSION: Trace bilateral pleural effusions and mild bibasilar atelectasis. Aortic Atherosclerosis (ICD10-I70.0). Electronically Signed   By: Inge Rise M.D.   On: 11/30/2020 15:42     CBC Recent Labs  Lab 12/14/20 1751 12/14/20 1756 12/14/20 2316 12/15/20 0459 12/16/20 0614  WBC 12.5*   --   --  8.5 17.0*  HGB 8.4* 8.8* 7.9* 7.4* 10.8*  HCT 26.1* 26.0* 25.0* 23.4* 32.7*  PLT 251  --   --  193 170  MCV 99.2  --   --  102.2* 94.5  MCH 31.9  --   --  32.3 31.2  MCHC 32.2  --   --  31.6 33.0  RDW 15.8*  --   --  15.9* 17.8*  LYMPHSABS 0.6*  --   --   --   --   MONOABS 1.4*  --   --   --   --   EOSABS 0.1  --   --   --   --   BASOSABS 0.0  --   --   --   --     Chemistries  Recent Labs  Lab 12/14/20 1751 12/14/20 1756 12/15/20 0459 12/16/20 0614  NA 131* 133* 135 137  K 4.3 4.4 4.1 3.3*  CL 103 104 107 108  CO2 19*  --  20* 19*  GLUCOSE 143* 139* 126* 99  BUN 39* 33* 35* 32*  CREATININE 1.61* 1.80* 1.60* 1.42*  CALCIUM 8.6*  --  8.1* 8.0*  AST 25  --   --   --   ALT 18  --   --   --   ALKPHOS 78  --   --   --   BILITOT 0.5  --   --   --    ------------------------------------------------------------------------------------------------------------------ No results for input(s): CHOL, HDL, LDLCALC, TRIG, CHOLHDL, LDLDIRECT in the last 72 hours.  No results found for: HGBA1C ------------------------------------------------------------------------------------------------------------------ No results for input(s): TSH, T4TOTAL, T3FREE, THYROIDAB in the last 72 hours.  Invalid input(s): FREET3 ------------------------------------------------------------------------------------------------------------------ Recent Labs    12/16/20 0614  VITAMINB12 381  FOLATE 15.8    Coagulation profile Recent Labs  Lab 12/14/20 1754  INR 1.1    No results for input(s): DDIMER in the last 72 hours.  Cardiac Enzymes No results for input(s): CKMB, TROPONINI, MYOGLOBIN in the last 168 hours.  Invalid input(s): CK ------------------------------------------------------------------------------------------------------------------    Component Value Date/Time   BNP 415.0 (H) 04/20/2018 1534     Roxan Hockey M.D on 12/16/2020 at 6:32 PM  Go to [email or web address] - for  contact  info  [facility] - Office  [phone]

## 2020-12-16 NOTE — TOC Progression Note (Signed)
Transition of Care [facility]) - Progression Note    Patient Details  Name: Cindy Schultz MRN: [id number] Date of Birth: Jun 08, 1924  [facility] CM/SW Contact  Natasha Bence, LCSW Phone Number: 12/16/2020, 4:46 PM  Clinical Narrative:    CSW contacted patient's daughter to inquire about SNF. Patient' reported that she had not decided on SNF yet, but expressed that the preference would be for patient to return to [facility] with palliative if physically able to. Patient's daughter also reported that if patient is unable, patient's preferred SNF's would be [facility]. TOC to follow.    Expected Discharge Plan: (P) Assisted Living Barriers to Discharge: (P) Continued Medical Work up  Expected Discharge Plan and Services Expected Discharge Plan: (P) Assisted Living       Living arrangements for the past 2 months: Assisted Living Facility                                       Social Determinants of Health (SDOH) Interventions    Readmission Risk Interventions No flowsheet data found.

## 2020-12-17 LAB — RENAL FUNCTION PANEL
Albumin: 3.2 g/dL — ABNORMAL LOW (ref 3.5–5.0)
Anion gap: 14 (ref 5–15)
BUN: 33 mg/dL — ABNORMAL HIGH (ref 8–23)
CO2: 15 mmol/L — ABNORMAL LOW (ref 22–32)
Calcium: 8.4 mg/dL — ABNORMAL LOW (ref 8.9–10.3)
Chloride: 108 mmol/L (ref 98–111)
Creatinine, Ser: 1.49 mg/dL — ABNORMAL HIGH (ref 0.44–1.00)
GFR, Estimated: 32 mL/min — ABNORMAL LOW (ref >60)
Glucose, Bld: 114 mg/dL — ABNORMAL HIGH (ref 70–99)
Phosphorus: 3.5 mg/dL (ref 2.5–4.6)
Potassium: 3.9 mmol/L (ref 3.5–5.1)
Sodium: 137 mmol/L (ref 135–145)

## 2020-12-17 LAB — CBC
HCT: 38.5 % (ref 36.0–46.0)
Hemoglobin: 12 g/dL (ref 12.0–15.0)
MCH: 30.8 pg (ref 26.0–34.0)
MCHC: 31.2 g/dL (ref 30.0–36.0)
MCV: 98.7 fL (ref 80.0–100.0)
Platelets: 173 10*3/uL (ref 150–400)
RBC: 3.9 MIL/uL (ref 3.87–5.11)
RDW: 18.2 % — ABNORMAL HIGH (ref 11.5–15.5)
WBC: 11 10*3/uL — ABNORMAL HIGH (ref 4.0–10.5)
nRBC: 0 % (ref 0.0–0.2)

## 2020-12-17 MED ORDER — SODIUM BICARBONATE 650 MG PO TABS
650.0000 mg | ORAL_TABLET | Freq: Two times a day (BID) | ORAL | Status: DC
  Administered 2020-12-17 – 2020-12-21 (×8): 650 mg via ORAL
  Filled 2020-12-17 (×8): qty 1

## 2020-12-17 NOTE — TOC Progression Note (Signed)
Transition of Care [facility]) - Progression Note    Patient Details  Name: TARIYAH PENDRY MRN: [id number] Date of Birth: 05/27/1924  Transition of Care [facility]) CM/SW Contact  Natasha Bence, LCSW Phone Number: 12/17/2020, 12:52 PM  Clinical Narrative:    CSW contacted [facility] to inquire if they would be able to take patient. Nanine Means reported that Admissions RN will be able to review patient for admission on Monday 12/17/20. If patient is not able to admit back to [facility] with PT and RN. CSW will peruse SNF placement. TOC to follow.    Expected Discharge Plan: (P) Assisted Living Barriers to Discharge: (P) Continued Medical Work up  Expected Discharge Plan and Services Expected Discharge Plan: (P) Assisted Living       Living arrangements for the past 2 months: Assisted Living Facility                                       Social Determinants of Health (SDOH) Interventions    Readmission Risk Interventions No flowsheet data found.

## 2020-12-17 NOTE — Progress Notes (Signed)
Patient Demographics:    Garry Bochicchio, is a 87 y.o. female, DOB - 11-11-1923, DZH:[id number]  Admit date - 12/14/2020   Admitting Physician Ejiroghene Arlyce Dice, MD  Outpatient Primary MD for the patient is Pcp, No  LOS - 3  Chief Complaint  Patient presents with   Rectal Bleeding        Subjective:    Finleigh Cheong today has no fevers, no emesis,  No chest pain,   -No further dark stools  Assessment  & Plan :    Principal Problem:   GI bleed Active Problems:   HTN (hypertension)   CKD (chronic kidney disease), stage IIIB   SIRS (systemic inflammatory response syndrome) ([place])  Brief Summary:-  87 y.o. female with medical history significant for hypertension, CKD 3B, DVT , recent COVID-19 infection , chronic anemia and dementia and recurrent diverticulitis/GI bleed readmitted on 12/14/2020 with concerns for acute GI bleed bleeding -Awaiting [facility] to reevaluate patient, but apparently this cannot be done until Monday, 12/19/2020, upon Re-evaluation if [facility] determines that they cannot provide level of care patient that pt needs now, then TOC/social work will have to find an SNF bed for patient  A/p  1)Acute GI bleed--- patient with painless rectal bleeding, suspect diverticular etiology -GI consult appreciated, GI requested CT abdomen and pelvis which demonstrates severe colonic diverticulosis and cholelithiasis -GI team call treatment options with patient's daughter [name], at this time family would like to avoid invasive procedures such as colonoscopy/EGD, etc.  They wish for comfort measures/noninvasive therapy.  They are agreeable to transfusion if needed.  -Last colonoscopy by Dr. [name] in 2018 showed multiple diverticula in the entire colon as well as polyps -Hemoglobin stable around 12 - 2)Dehydration and HypoNatremia/Hypokalemia--- resolved with IV fluids, replaced  potassium   3)CKD stage - III B -Renal function appears to be at baseline -renally adjust medications, avoid nephrotoxic agents / dehydration  / hypotension  4)HTN-continue hydralazine p.o. and as needed labetalol IV  5)SIRS--SIRS pathophysiology resolving, no further tachycardia or tachypnea and no fevers -Okay to continue Rocephin and Flagyl mostly for intra-abdominal concerns -Lung findings on CT abdomen and pelvis may be due to recent COVID-19 infection  6)Dementia--- Per family patient has been having more cognitive and memory deficits lately -c/n  Aricept and sertraline  7)social/ethics--DNR/DNI,  patient's daughter [name] at [phone] primary decision maker, -Palliative care consult appreciated  8) acute on chronic anemia--hemoglobin is stable around 12 after transfusion of 1 units of PRBC on 12/15/2020  ,--B12 and folate acid are not low  9)Generalized weakness and deconditioning----physical therapy eval appreciated recommends SNF rehab --Awaiting [facility] to reevaluate patient, but apparently this cannot be done until Monday, 12/19/2020, upon Re-evaluation if [facility] determines that they cannot provide level of care patient that pt needs now, then TOC/social work will have to find an SNF bed for patient  Disposition/Need for in-Hospital Stay- patient unable to be discharged at this time due to --SIRS requiring IV antibiotics and GI bleed with symptomatic anemia requiring transfusion*  Status is: Inpatient  Remains inpatient appropriate because: Please see disposition above  Disposition: The patient is from: ALF              Anticipated  d/c is to: [facility]              Anticipated d/c date is: 2 days              Patient currently is not medically stable to d/c. Barriers: Not Clinically Stable-  --Awaiting [facility] to reevaluate patient, but apparently this cannot be done until Monday, 12/19/2020, upon Re-evaluation if [facility] determines  that they cannot provide level of care patient that pt needs now, then TOC/social work will have to find an SNF bed for patient  Code Status : -  Code Status: DNR   Family Communication:    -Patient's daughter [name] is primary decision-maker  Consults  :  Gi  DVT Prophylaxis  :   - SCDs   SCDs Start: 12/14/20 2056    Lab Results  Component Value Date   PLT 173 12/17/2020    Inpatient Medications  Scheduled Meds:  donepezil  5 mg Oral QHS   dorzolamide-timolol  1 drop Both Eyes BID   hydrALAZINE  50 mg Oral TID   melatonin  6 mg Oral QHS   senna-docusate  2 tablet Oral QHS   sertraline  50 mg Oral Daily   sodium bicarbonate  650 mg Oral BID   Continuous Infusions:  cefTRIAXone (ROCEPHIN)  IV 1 g (12/16/20 2307)   metronidazole 500 mg (12/17/20 1731)   PRN Meds:.acetaminophen **OR** acetaminophen, labetalol, ondansetron **OR** ondansetron (ZOFRAN) IV, polyethylene glycol    Anti-infectives (From admission, onward)    Start     Dose/Rate Route Frequency Ordered Stop   12/14/20 2300  cefTRIAXone (ROCEPHIN) 1 g in sodium chloride 0.9 % 100 mL IVPB        1 g 200 mL/hr over 30 Minutes Intravenous Every 24 hours 12/14/20 2213     12/14/20 2300  metroNIDAZOLE (FLAGYL) IVPB 500 mg        500 mg 100 mL/hr over 60 Minutes Intravenous Every 8 hours 12/14/20 2213           Objective:   Vitals:   12/17/20 0926 12/17/20 1357 12/17/20 1733 12/17/20 1739  BP: (!) 163/69 140/82 (!) 186/79 (!) 186/79  Pulse: 69 63  69  Resp: 20 20    Temp: 98.5 F (36.9 C) 98.3 F (36.8 C)    TempSrc: Oral Oral    SpO2: 96% 96%  96%  Weight:      Height:        Wt Readings from Last 3 Encounters:  12/14/20 60.5 kg  11/30/20 61.4 kg  04/20/18 61.4 kg     Intake/Output Summary (Last 24 hours) at 12/17/2020 1832 Last data filed at 12/17/2020 1739 Gross per 24 hour  Intake 1423.43 ml  Output 1500 ml  Net -76.57 ml    Physical Exam  Gen:- Awake Alert,  in no apparent  distress, not really talkative HEENT:- [place].AT, No sclera icterus, HOH Neck-Supple Neck,No JVD,.  Lungs-  CTAB , fair symmetrical air movement CV- S1, S2 normal, regular  Abd-  +ve B.Sounds, Abd Soft, No tenderness,    Extremity/Skin:- No  edema, pedal pulses present  Psych-baseline cognitive and memory deficits Neuro-generalized weakness, no new focal deficits, no tremors   Data Review:   Micro Results Recent Results (from the past 240 hour(s))  Culture, blood (routine x 2)     Status: None (Preliminary result)   Collection Time: 12/14/20  9:49 PM   Specimen: Left Antecubital; Blood  Result Value Ref  Range Status   Specimen Description LEFT ANTECUBITAL  Final   Special Requests   Final    BOTTLES DRAWN AEROBIC AND ANAEROBIC Blood Culture adequate volume   Culture   Final    NO GROWTH 3 DAYS Performed at [facility], [address], [place], [place] [address]    Report Status PENDING  Incomplete  Culture, blood (routine x 2)     Status: None (Preliminary result)   Collection Time: 12/14/20 10:00 PM   Specimen: BLOOD LEFT HAND  Result Value Ref Range Status   Specimen Description BLOOD LEFT HAND  Final   Special Requests   Final    BOTTLES DRAWN AEROBIC AND ANAEROBIC Blood Culture adequate volume   Culture   Final    NO GROWTH 3 DAYS Performed at [facility], [address] [place], [place] [address]    Report Status PENDING  Incomplete  Culture, Urine     Status: None   Collection Time: 12/14/20 10:45 PM   Specimen: Urine, Clean Catch  Result Value Ref Range Status   Specimen Description   Final    URINE, CLEAN CATCH Performed at [facility], [address] [place], [place] [address]    Special Requests   Final    NONE Performed at [facility], [address], [place], [place] [address]    Culture   Final    NO GROWTH Performed at [facility], [place] [address] [place], [place] [address]    Report Status 12/16/2020 FINAL  Final    Radiology Reports CT  ABDOMEN PELVIS WO CONTRAST  Result Date: 12/15/2020 CLINICAL DATA:  87 year old female with left lower quadrant abdominal pain and GI bleed. EXAM: CT ABDOMEN AND PELVIS WITHOUT CONTRAST TECHNIQUE: Multidetector CT imaging of the abdomen and pelvis was performed following the standard protocol without IV contrast. COMPARISON:  CT abdomen pelvis dated 08/28/2017. FINDINGS: Evaluation of this exam is limited in the absence of intravenous contrast. Lower chest: Small bilateral pleural effusions with associated partial compressive atelectasis of the lower lobes. Pneumonia is not excluded clinical correlation is recommended. There is coronary vascular calcification. No intra-abdominal free air or free fluid. Hepatobiliary: The liver is unremarkable. No intrahepatic biliary dilatation. Several small stones within the gallbladder. The gallbladder is mildly distended. No pericholecystic fluid or evidence of acute cholecystitis by CT. Ultrasound may provide better evaluation if there is high clinical concern for acute cholecystitis. Pancreas: Unremarkable. No pancreatic ductal dilatation or surrounding inflammatory changes. Spleen: Normal in size without focal abnormality. Adrenals/Urinary Tract: The adrenal glands are unremarkable. The kidneys, visualized ureters, and urinary bladder are unremarkable. Stomach/Bowel: There is severe sigmoid diverticulosis and diffuse colonic diverticula without active inflammatory changes. There is no bowel obstruction. The appendix is not visualized with certainty. No inflammatory changes identified in the right lower quadrant. Vascular/Lymphatic: Moderate aortoiliac atherosclerotic disease. The IVC is unremarkable. No portal venous gas. There is no adenopathy. Reproductive: Hysterectomy. No adnexal masses. Other: None Musculoskeletal: Osteopenia with degenerative changes of the spine and hips. L3 vertebroplasty. No acute osseous pathology. IMPRESSION: 1. No acute intra-abdominal or pelvic  pathology. 2. Severe colonic diverticulosis. No bowel obstruction. 3. Cholelithiasis. 4. Small bilateral pleural effusions with associated partial compressive atelectasis of the lower lobes. Pneumonia is not excluded clinical correlation is recommended. 5. Aortic Atherosclerosis (ICD10-I70.0). Electronically Signed   By: Anner Crete M.D.   On: 12/15/2020 16:36   CT Head Wo Contrast  Result Date: 11/30/2020 CLINICAL DATA:  87 year old female with altered mental  status. EXAM: CT HEAD WITHOUT CONTRAST TECHNIQUE: Contiguous axial images were obtained from the base of the skull through the vertex without intravenous contrast. COMPARISON:  Head CT dated 05/29/2011. FINDINGS: Brain: Mild age-related atrophy and chronic microvascular ischemic changes. There is no acute intracranial hemorrhage. No mass effect or midline shift. No extra-axial fluid collection. Vascular: No hyperdense vessel or unexpected calcification. Skull: Normal. Negative for fracture or focal lesion. Sinuses/Orbits: There is complete opacification of the left maxillary sinus with chronic remodeling and destruction of the medial wall. Bilateral mastoid effusions. No air-fluid level. Other: None IMPRESSION: 1. No acute intracranial pathology. 2. Mild age-related atrophy and chronic microvascular ischemic changes. 3. Chronic left maxillary sinus disease. 4. Bilateral mastoid effusions. Electronically Signed   By: Anner Crete M.D.   On: 11/30/2020 16:20   DG CHEST PORT 1 VIEW  Result Date: 12/14/2020 CLINICAL DATA:  87 year old female with fever. EXAM: PORTABLE CHEST 1 VIEW COMPARISON:  Chest radiograph dated 12/30/2017. FINDINGS: No focal consolidation, pleural effusion, or pneumothorax. The cardiac silhouette is within limits. Atherosclerotic calcification of the aorta. Osteopenia with degenerative changes of the spine. No acute osseous pathology. IMPRESSION: No active disease. Electronically Signed   By: Anner Crete M.D.   On:  12/14/2020 20:31   DG Chest Portable 1 View  Result Date: 11/30/2020 CLINICAL DATA:  Altered mental status. EXAM: PORTABLE CHEST 1 VIEW COMPARISON:  Single-view of the chest 12/30/2017 and CT chest 12/31/2017. FINDINGS: Trace bilateral pleural effusions and mild basilar atelectasis. Heart size is normal. Aortic atherosclerosis. No pneumothorax. No acute or focal bony abnormality. IMPRESSION: Trace bilateral pleural effusions and mild bibasilar atelectasis. Aortic Atherosclerosis (ICD10-I70.0). Electronically Signed   By: Inge Rise M.D.   On: 11/30/2020 15:42     CBC Recent Labs  Lab 12/14/20 1751 12/14/20 1756 12/14/20 2316 12/15/20 0459 12/16/20 0614 12/17/20 0632  WBC 12.5*  --   --  8.5 17.0* 11.0*  HGB 8.4* 8.8* 7.9* 7.4* 10.8* 12.0  HCT 26.1* 26.0* 25.0* 23.4* 32.7* 38.5  PLT 251  --   --  193 170 173  MCV 99.2  --   --  102.2* 94.5 98.7  MCH 31.9  --   --  32.3 31.2 30.8  MCHC 32.2  --   --  31.6 33.0 31.2  RDW 15.8*  --   --  15.9* 17.8* 18.2*  LYMPHSABS 0.6*  --   --   --   --   --   MONOABS 1.4*  --   --   --   --   --   EOSABS 0.1  --   --   --   --   --   BASOSABS 0.0  --   --   --   --   --     Chemistries  Recent Labs  Lab 12/14/20 1751 12/14/20 1756 12/15/20 0459 12/16/20 0614 12/17/20 0632  NA 131* 133* 135 137 137  K 4.3 4.4 4.1 3.3* 3.9  CL 103 104 107 108 108  CO2 19*  --  20* 19* 15*  GLUCOSE 143* 139* 126* 99 114*  BUN 39* 33* 35* 32* 33*  CREATININE 1.61* 1.80* 1.60* 1.42* 1.49*  CALCIUM 8.6*  --  8.1* 8.0* 8.4*  AST 25  --   --   --   --   ALT 18  --   --   --   --   ALKPHOS 78  --   --   --   --  BILITOT 0.5  --   --   --   --    ------------------------------------------------------------------------------------------------------------------ No results for input(s): CHOL, HDL, LDLCALC, TRIG, CHOLHDL, LDLDIRECT in the last 72 hours.  No results found for:  HGBA1C ------------------------------------------------------------------------------------------------------------------ No results for input(s): TSH, T4TOTAL, T3FREE, THYROIDAB in the last 72 hours.  Invalid input(s): FREET3 ------------------------------------------------------------------------------------------------------------------ Recent Labs    12/16/20 0614  VITAMINB12 381  FOLATE 15.8    Coagulation profile Recent Labs  Lab 12/14/20 1754  INR 1.1    No results for input(s): DDIMER in the last 72 hours.  Cardiac Enzymes No results for input(s): CKMB, TROPONINI, MYOGLOBIN in the last 168 hours.  Invalid input(s): CK ------------------------------------------------------------------------------------------------------------------    Component Value Date/Time   BNP 415.0 (H) 04/20/2018 1534     Roxan Hockey M.D on 12/17/2020 at 6:32 PM  Go to [email or web address] - for contact info  [facility] - Office  [phone]

## 2020-12-18 NOTE — Progress Notes (Signed)
Patient Demographics:    Cindy Schultz, is a 87 y.o. female, DOB - 09-02-1923, SWN:[id number]  Admit date - 12/14/2020   Admitting Physician Ejiroghene Arlyce Dice, MD  Outpatient Primary MD for the patient is Pcp, No  LOS - 4  Chief Complaint  Patient presents with   Rectal Bleeding        Subjective:    Cindy Schultz today has no fevers, no emesis,  No chest pain,   Episodes of confusion and disorientation persist  Assessment  & Plan :    Principal Problem:   GI bleed Active Problems:   HTN (hypertension)   CKD (chronic kidney disease), stage IIIB   SIRS (systemic inflammatory response syndrome) (HCC)  Brief Summary:-  87 y.o. female with medical history significant for hypertension, CKD 3B, DVT , recent COVID-19 infection , chronic anemia and dementia and recurrent diverticulitis/GI bleed readmitted on 12/14/2020 with concerns for acute GI bleed bleeding -Awaiting [facility] to reevaluate patient, but apparently this cannot be done until Monday, 12/19/2020, upon Re-evaluation if [facility] determines that they cannot provide level of care patient that pt needs now, then TOC/social work will have to find an SNF bed for patient  A/p  1)Acute GI bleed--- patient with painless rectal bleeding, suspect diverticular etiology -GI consult appreciated, GI requested CT abdomen and pelvis which demonstrates severe colonic diverticulosis and cholelithiasis -GI team call treatment options with patient's daughter [name], at this time family would like to avoid invasive procedures such as colonoscopy/EGD, etc.  They wish for comfort measures/noninvasive therapy.  They are agreeable to transfusion if needed.  -Last colonoscopy by Dr. [name] in 2018 showed multiple diverticula in the entire colon as well as polyps -Hemoglobin stable around 12 - 2)Dehydration and HypoNatremia/Hypokalemia--- resolved with  IV fluids, replaced potassium   3)CKD stage - III B -Renal function appears to be at baseline -renally adjust medications, avoid nephrotoxic agents / dehydration  / hypotension  4)HTN-continue hydralazine p.o. and as needed labetalol IV  5)SIRS--SIRS pathophysiology resolving, no further tachycardia or tachypnea and no fevers -Okay to continue Rocephin and Flagyl mostly for intra-abdominal concerns -Lung findings on CT abdomen and pelvis may be due to recent COVID-19 infection  6)Dementia--- Per family patient has been having more cognitive and memory deficits lately -c/n  Aricept and sertraline  7)social/ethics--DNR/DNI,  patient's daughter [name] at [phone] primary decision maker, -Palliative care consult appreciated  8) acute on chronic anemia--hemoglobin is stable around 12 after transfusion of 1 units of PRBC on 12/15/2020  ,--B12 and folate acid are not low  9)Generalized weakness and deconditioning----physical therapy eval appreciated recommends SNF rehab - Disposition/Need for in-Hospital Stay- patient unable to be discharged at this time due to --SIRS requiring IV antibiotics and GI bleed with symptomatic anemia requiring transfusion --Awaiting [facility] to reevaluate patient, but apparently this cannot be done until Monday, 12/19/2020, upon Re-evaluation if [facility] determines that they cannot provide level of care patient that pt needs now, then TOC/social work will have to find an SNF bed for patient  Status is: Inpatient  Remains inpatient appropriate because: Please see disposition above  Disposition: The patient is from: ALF  Anticipated d/c is to: [facility]              Anticipated d/c date is: 2 days              Patient currently is not medically stable to d/c. Barriers: Not Clinically Stable-  --Awaiting [facility] to reevaluate patient, but apparently this cannot be done until Monday, 12/19/2020, upon Re-evaluation if  [facility] determines that they cannot provide level of care patient that pt needs now, then TOC/social work will have to find an SNF bed for patient  Code Status : -  Code Status: DNR   Family Communication:    -Patient's daughter [name] is primary decision-maker  Consults  :  Gi  DVT Prophylaxis  :   - SCDs   SCDs Start: 12/14/20 2056  Lab Results  Component Value Date   PLT 173 12/17/2020    Inpatient Medications  Scheduled Meds:  donepezil  5 mg Oral QHS   dorzolamide-timolol  1 drop Both Eyes BID   hydrALAZINE  50 mg Oral TID   melatonin  6 mg Oral QHS   senna-docusate  2 tablet Oral QHS   sertraline  50 mg Oral Daily   sodium bicarbonate  650 mg Oral BID   Continuous Infusions:  cefTRIAXone (ROCEPHIN)  IV 1 g (12/18/20 0001)   metronidazole 500 mg (12/18/20 1611)   PRN Meds:.acetaminophen **OR** acetaminophen, labetalol, ondansetron **OR** ondansetron (ZOFRAN) IV, polyethylene glycol   Anti-infectives (From admission, onward)    Start     Dose/Rate Route Frequency Ordered Stop   12/14/20 2300  cefTRIAXone (ROCEPHIN) 1 g in sodium chloride 0.9 % 100 mL IVPB        1 g 200 mL/hr over 30 Minutes Intravenous Every 24 hours 12/14/20 2213     12/14/20 2300  metroNIDAZOLE (FLAGYL) IVPB 500 mg        500 mg 100 mL/hr over 60 Minutes Intravenous Every 8 hours 12/14/20 2213           Objective:   Vitals:   12/17/20 2052 12/18/20 0503 12/18/20 0900 12/18/20 1508  BP: (!) 182/79 (!) 164/74 (!) 151/81 (!) 143/82  Pulse: 72 79 63 (!) 55  Resp: 20 20  17   Temp: 98.8 F (37.1 C) 98.9 F (37.2 C)  97.9 F (36.6 C)  TempSrc:    Oral  SpO2: 96% 92% 96% 95%  Weight:      Height:        Wt Readings from Last 3 Encounters:  12/14/20 60.5 kg  11/30/20 61.4 kg  04/20/18 61.4 kg     Intake/Output Summary (Last 24 hours) at 12/18/2020 1617 Last data filed at 12/18/2020 1400 Gross per 24 hour  Intake 720 ml  Output 400 ml  Net 320 ml    Physical  Exam  Gen:- Awake Alert,  in no apparent distress, not really talkative HEENT:- [place].AT, No sclera icterus, HOH Neck-Supple Neck,No JVD,.  Lungs-  CTAB , fair symmetrical air movement CV- S1, S2 normal, regular  Abd-  +ve B.Sounds, Abd Soft, No tenderness,    Extremity/Skin:- No  edema, pedal pulses present  Psych-baseline cognitive and memory deficits Neuro-generalized weakness, no new focal deficits, no tremors   Data Review:   Micro Results Recent Results (from the past 240 hour(s))  Culture, blood (routine x 2)     Status: None (Preliminary result)   Collection Time: 12/14/20  9:49 PM   Specimen: Left Antecubital; Blood  Result Value Ref Range Status   Specimen Description LEFT ANTECUBITAL  Final   Special Requests   Final    BOTTLES DRAWN AEROBIC AND ANAEROBIC Blood Culture adequate volume   Culture   Final    NO GROWTH 4 DAYS Performed at [facility], [address] [place], [place] [address]    Report Status PENDING  Incomplete  Culture, blood (routine x 2)     Status: None (Preliminary result)   Collection Time: 12/14/20 10:00 PM   Specimen: BLOOD LEFT HAND  Result Value Ref Range Status   Specimen Description BLOOD LEFT HAND  Final   Special Requests   Final    BOTTLES DRAWN AEROBIC AND ANAEROBIC Blood Culture adequate volume   Culture   Final    NO GROWTH 4 DAYS Performed at [facility], [address], [place], [place] [address]    Report Status PENDING  Incomplete  Culture, Urine     Status: None   Collection Time: 12/14/20 10:45 PM   Specimen: Urine, Clean Catch  Result Value Ref Range Status   Specimen Description   Final    URINE, CLEAN CATCH Performed at [facility], [address] [place], [place] [address]    Special Requests   Final    NONE Performed at [facility], [address], [place], [place] [address]    Culture   Final    NO GROWTH Performed at [facility], [place] [address], [place], [place] [address]    Report Status 12/16/2020  FINAL  Final    Radiology Reports CT ABDOMEN PELVIS WO CONTRAST  Result Date: 12/15/2020 CLINICAL DATA:  87 year old female with left lower quadrant abdominal pain and GI bleed. EXAM: CT ABDOMEN AND PELVIS WITHOUT CONTRAST TECHNIQUE: Multidetector CT imaging of the abdomen and pelvis was performed following the standard protocol without IV contrast. COMPARISON:  CT abdomen pelvis dated 08/28/2017. FINDINGS: Evaluation of this exam is limited in the absence of intravenous contrast. Lower chest: Small bilateral pleural effusions with associated partial compressive atelectasis of the lower lobes. Pneumonia is not excluded clinical correlation is recommended. There is coronary vascular calcification. No intra-abdominal free air or free fluid. Hepatobiliary: The liver is unremarkable. No intrahepatic biliary dilatation. Several small stones within the gallbladder. The gallbladder is mildly distended. No pericholecystic fluid or evidence of acute cholecystitis by CT. Ultrasound may provide better evaluation if there is high clinical concern for acute cholecystitis. Pancreas: Unremarkable. No pancreatic ductal dilatation or surrounding inflammatory changes. Spleen: Normal in size without focal abnormality. Adrenals/Urinary Tract: The adrenal glands are unremarkable. The kidneys, visualized ureters, and urinary bladder are unremarkable. Stomach/Bowel: There is severe sigmoid diverticulosis and diffuse colonic diverticula without active inflammatory changes. There is no bowel obstruction. The appendix is not visualized with certainty. No inflammatory changes identified in the right lower quadrant. Vascular/Lymphatic: Moderate aortoiliac atherosclerotic disease. The IVC is unremarkable. No portal venous gas. There is no adenopathy. Reproductive: Hysterectomy. No adnexal masses. Other: None Musculoskeletal: Osteopenia with degenerative changes of the spine and hips. L3 vertebroplasty. No acute osseous pathology.  IMPRESSION: 1. No acute intra-abdominal or pelvic pathology. 2. Severe colonic diverticulosis. No bowel obstruction. 3. Cholelithiasis. 4. Small bilateral pleural effusions with associated partial compressive atelectasis of the lower lobes. Pneumonia is not excluded clinical correlation is recommended. 5. Aortic Atherosclerosis (ICD10-I70.0). Electronically Signed   By: Anner Crete M.D.   On: 12/15/2020 16:36   CT Head Wo Contrast  Result Date: 11/30/2020 CLINICAL DATA:  87 year old female  with altered mental status. EXAM: CT HEAD WITHOUT CONTRAST TECHNIQUE: Contiguous axial images were obtained from the base of the skull through the vertex without intravenous contrast. COMPARISON:  Head CT dated 05/29/2011. FINDINGS: Brain: Mild age-related atrophy and chronic microvascular ischemic changes. There is no acute intracranial hemorrhage. No mass effect or midline shift. No extra-axial fluid collection. Vascular: No hyperdense vessel or unexpected calcification. Skull: Normal. Negative for fracture or focal lesion. Sinuses/Orbits: There is complete opacification of the left maxillary sinus with chronic remodeling and destruction of the medial wall. Bilateral mastoid effusions. No air-fluid level. Other: None IMPRESSION: 1. No acute intracranial pathology. 2. Mild age-related atrophy and chronic microvascular ischemic changes. 3. Chronic left maxillary sinus disease. 4. Bilateral mastoid effusions. Electronically Signed   By: Anner Crete M.D.   On: 11/30/2020 16:20   DG CHEST PORT 1 VIEW  Result Date: 12/14/2020 CLINICAL DATA:  87 year old female with fever. EXAM: PORTABLE CHEST 1 VIEW COMPARISON:  Chest radiograph dated 12/30/2017. FINDINGS: No focal consolidation, pleural effusion, or pneumothorax. The cardiac silhouette is within limits. Atherosclerotic calcification of the aorta. Osteopenia with degenerative changes of the spine. No acute osseous pathology. IMPRESSION: No active disease.  Electronically Signed   By: Anner Crete M.D.   On: 12/14/2020 20:31   DG Chest Portable 1 View  Result Date: 11/30/2020 CLINICAL DATA:  Altered mental status. EXAM: PORTABLE CHEST 1 VIEW COMPARISON:  Single-view of the chest 12/30/2017 and CT chest 12/31/2017. FINDINGS: Trace bilateral pleural effusions and mild basilar atelectasis. Heart size is normal. Aortic atherosclerosis. No pneumothorax. No acute or focal bony abnormality. IMPRESSION: Trace bilateral pleural effusions and mild bibasilar atelectasis. Aortic Atherosclerosis (ICD10-I70.0). Electronically Signed   By: Inge Rise M.D.   On: 11/30/2020 15:42     CBC Recent Labs  Lab 12/14/20 1751 12/14/20 1756 12/14/20 2316 12/15/20 0459 12/16/20 0614 12/17/20 0632  WBC 12.5*  --   --  8.5 17.0* 11.0*  HGB 8.4* 8.8* 7.9* 7.4* 10.8* 12.0  HCT 26.1* 26.0* 25.0* 23.4* 32.7* 38.5  PLT 251  --   --  193 170 173  MCV 99.2  --   --  102.2* 94.5 98.7  MCH 31.9  --   --  32.3 31.2 30.8  MCHC 32.2  --   --  31.6 33.0 31.2  RDW 15.8*  --   --  15.9* 17.8* 18.2*  LYMPHSABS 0.6*  --   --   --   --   --   MONOABS 1.4*  --   --   --   --   --   EOSABS 0.1  --   --   --   --   --   BASOSABS 0.0  --   --   --   --   --     Chemistries  Recent Labs  Lab 12/14/20 1751 12/14/20 1756 12/15/20 0459 12/16/20 0614 12/17/20 0632  NA 131* 133* 135 137 137  K 4.3 4.4 4.1 3.3* 3.9  CL 103 104 107 108 108  CO2 19*  --  20* 19* 15*  GLUCOSE 143* 139* 126* 99 114*  BUN 39* 33* 35* 32* 33*  CREATININE 1.61* 1.80* 1.60* 1.42* 1.49*  CALCIUM 8.6*  --  8.1* 8.0* 8.4*  AST 25  --   --   --   --   ALT 18  --   --   --   --   ALKPHOS 78  --   --   --   --  BILITOT 0.5  --   --   --   --    ------------------------------------------------------------------------------------------------------------------ No results for input(s): CHOL, HDL, LDLCALC, TRIG, CHOLHDL, LDLDIRECT in the last 72 hours.  No results found for:  HGBA1C ------------------------------------------------------------------------------------------------------------------ No results for input(s): TSH, T4TOTAL, T3FREE, THYROIDAB in the last 72 hours.  Invalid input(s): FREET3 ------------------------------------------------------------------------------------------------------------------ Recent Labs    12/16/20 0614  VITAMINB12 381  FOLATE 15.8    Coagulation profile Recent Labs  Lab 12/14/20 1754  INR 1.1    No results for input(s): DDIMER in the last 72 hours.  Cardiac Enzymes No results for input(s): CKMB, TROPONINI, MYOGLOBIN in the last 168 hours.  Invalid input(s): CK ------------------------------------------------------------------------------------------------------------------    Component Value Date/Time   BNP 415.0 (H) 04/20/2018 1534     Roxan Hockey M.D on 12/18/2020 at 4:17 PM  Go to [email or web address] - for contact info  [facility] - Office  [phone]

## 2020-12-19 LAB — CBC
HCT: 31.1 % — ABNORMAL LOW (ref 36.0–46.0)
Hemoglobin: 9.9 g/dL — ABNORMAL LOW (ref 12.0–15.0)
MCH: 30.8 pg (ref 26.0–34.0)
MCHC: 31.8 g/dL (ref 30.0–36.0)
MCV: 96.9 fL (ref 80.0–100.0)
Platelets: 96 K/uL — ABNORMAL LOW (ref 150–400)
RBC: 3.21 MIL/uL — ABNORMAL LOW (ref 3.87–5.11)
RDW: 17.3 % — ABNORMAL HIGH (ref 11.5–15.5)
WBC: 11.5 K/uL — ABNORMAL HIGH (ref 4.0–10.5)
nRBC: 0 % (ref 0.0–0.2)

## 2020-12-19 LAB — BASIC METABOLIC PANEL
Anion gap: 9 (ref 5–15)
BUN: 43 mg/dL — ABNORMAL HIGH (ref 8–23)
CO2: 18 mmol/L — ABNORMAL LOW (ref 22–32)
Calcium: 8.1 mg/dL — ABNORMAL LOW (ref 8.9–10.3)
Chloride: 111 mmol/L (ref 98–111)
Creatinine, Ser: 1.75 mg/dL — ABNORMAL HIGH (ref 0.44–1.00)
GFR, Estimated: 27 mL/min — ABNORMAL LOW (ref >60)
Glucose, Bld: 138 mg/dL — ABNORMAL HIGH (ref 70–99)
Potassium: 2.6 mmol/L — CL (ref 3.5–5.1)
Sodium: 138 mmol/L (ref 135–145)

## 2020-12-19 MED ORDER — POTASSIUM CHLORIDE CRYS ER 20 MEQ PO TBCR
40.0000 meq | EXTENDED_RELEASE_TABLET | ORAL | Status: AC
  Administered 2020-12-19: 40 meq via ORAL
  Filled 2020-12-19 (×2): qty 2

## 2020-12-19 MED ORDER — MAGNESIUM SULFATE 2 GM/50ML IV SOLN
2.0000 g | Freq: Once | INTRAVENOUS | Status: AC
  Administered 2020-12-19: 2 g via INTRAVENOUS
  Filled 2020-12-19: qty 50

## 2020-12-19 MED ORDER — POTASSIUM CHLORIDE 10 MEQ/100ML IV SOLN
10.0000 meq | INTRAVENOUS | Status: AC
  Administered 2020-12-19 (×4): 10 meq via INTRAVENOUS
  Filled 2020-12-19: qty 100

## 2020-12-19 NOTE — TOC Progression Note (Signed)
Transition of Care [facility]) - Progression Note    Patient Details  Name: Cindy Schultz MRN: [id number] Date of Birth: 09-Mar-1924  Transition of Care [facility] CM/SW Contact  Natasha Bence, LCSW Phone Number: 12/19/2020, 1:12 PM  Clinical Narrative:    CSW contacted [facility] to inquire about assesment for patient. Front desk reported that Admissions is not available on Holiday despite previous discussion. See previous note. MD reported patient maybe hospice appropriate, but palliative RN will need to further discuss hospice with patient. TOC to follow.    Expected Discharge Plan: (P) Assisted Living Barriers to Discharge: (P) Continued Medical Work up  Expected Discharge Plan and Services Expected Discharge Plan: (P) Assisted Living       Living arrangements for the past 2 months: Assisted Living Facility                                       Social Determinants of Health (SDOH) Interventions    Readmission Risk Interventions No flowsheet data found.

## 2020-12-19 NOTE — Progress Notes (Signed)
Critical lab: Potassium 2.6  Provider notified: Dr. Joesph Fillers   Date notified 12/19/2020  Time notified 1045

## 2020-12-19 NOTE — Progress Notes (Signed)
Patient Demographics:    Cindy Schultz, is a 87 y.o. female, DOB - [birth year], ION:[id number]  Admit date - 12/14/2020   Admitting Physician Ejiroghene Arlyce Dice, MD  Outpatient Primary MD for the patient is Pcp, No  LOS - 5  Chief Complaint  Patient presents with   Rectal Bleeding        Subjective:    Cindy Schultz today has no fevers, no emesis,  No chest pain,   -Oral intake is unreliable due to persistent lethargy --Given very advanced age and comorbid conditions and poor oral intake due to metabolic encephalopathy and lethargy patient may be hospice appropriate  Assessment  & Plan :    Principal Problem:   GI bleed Active Problems:   HTN (hypertension)   CKD (chronic kidney disease), stage IIIB   SIRS (systemic inflammatory response syndrome) (HCC)  Brief Summary:-  87 y.o. female with medical history significant for hypertension, CKD 3B, DVT , recent COVID-19 infection , chronic anemia and dementia and recurrent diverticulitis/GI bleed readmitted on 12/14/2020 with concerns for acute GI bleed bleeding -Awaiting [facility] to reevaluate patient, but apparently this cannot be done until Monday, 12/19/2020, upon Re-evaluation if [facility] determines that they cannot provide level of care patient that pt needs now, then TOC/social work will have to find an SNF bed for patient -Given very advanced age and comorbid conditions and poor oral intake due to metabolic encephalopathy and lethargy patient may be hospice appropriate  A/p  1)Acute GI bleed--- patient with painless rectal bleeding, suspect diverticular etiology -GI consult appreciated, GI requested CT abdomen and pelvis which demonstrates severe colonic diverticulosis and cholelithiasis -GI team call treatment options with patient's daughter [name], at this time family would like to avoid invasive procedures such as  colonoscopy/EGD, etc.  They wish for comfort measures/noninvasive therapy.  They are agreeable to transfusion if needed.  -Last colonoscopy by Dr. [name] in 2018 showed multiple diverticula in the entire colon as well as polyps -Hemoglobin drifting down again currently 9.9 - 2)Dehydration and HypoNatremia/Hypokalemia--- resolved with IV fluids, replaced potassium   3)CKD stage - III B -Renal function appears to be at baseline -renally adjust medications, avoid nephrotoxic agents / dehydration  / hypotension  4)HTN-continue hydralazine p.o. and as needed labetalol IV  5)SIRS/acute metabolic encephalopathy--SIRS pathophysiology resolving, no further tachycardia or tachypnea and no fevers -Okay to continue Rocephin and Flagyl mostly for intra-abdominal concerns -Lung findings on CT abdomen and pelvis may be due to recent COVID-19 infection  6)Dementia--- Per family patient has been having more cognitive and memory deficits lately -c/n  Aricept and sertraline  7)social/ethics--DNR/DNI,  patient's daughter [name] at [phone] primary decision maker, -Palliative care consult appreciated --Given very advanced age and comorbid conditions and poor oral intake due to metabolic encephalopathy and lethargy patient may be hospice appropriate  8) acute on chronic anemia--hemoglobin is initially stable after transfusion of 1 units of PRBC on 12/15/2020  -Hemoglobin drifting down again ,--B12 and folate acid are not low  9)Generalized weakness and deconditioning----physical therapy eval appreciated recommends SNF rehab - Disposition/Need for in-Hospital Stay- patient unable to be discharged at this time due to --SIRS requiring IV antibiotics and GI bleed with symptomatic anemia requiring transfusion --Awaiting [facility] to reevaluate patient, but apparently this cannot be done until Monday, 12/19/2020, upon Re-evaluation if [facility] determines that they cannot provide level of care  patient that pt needs now, then TOC/social work will have to find an SNF bed for patient ??  Hospice appropriate-please see above  Status is: Inpatient  Remains inpatient appropriate because: Please see disposition above  Disposition: The patient is from: ALF              Anticipated d/c is to: [facility]F [facility]              Anticipated d/c date is: 2 days              Patient currently is not medically stable to d/c. Barriers: Not Clinically Stable-  --Awaiting [facility] to reevaluate patient, but apparently this cannot be done until Monday, 12/19/2020, upon Re-evaluation if [facility] determines that they cannot provide level of care patient that pt needs now, then TOC/social work will have to find an SNF bed for patient  Code Status : -  Code Status: DNR   Family Communication:    -Patient's daughter [name] is primary decision-maker  Consults  :  Gi  DVT Prophylaxis  :   - SCDs   SCDs Start: 12/14/20 2056  Lab Results  Component Value Date   PLT 96 (L) 12/19/2020    Inpatient Medications  Scheduled Meds:  donepezil  5 mg Oral QHS   dorzolamide-timolol  1 drop Both Eyes BID   hydrALAZINE  50 mg Oral TID   melatonin  6 mg Oral QHS   senna-docusate  2 tablet Oral QHS   sertraline  50 mg Oral Daily   sodium bicarbonate  650 mg Oral BID   Continuous Infusions:  cefTRIAXone (ROCEPHIN)  IV Stopped (12/18/20 2258)   metronidazole 500 mg (12/19/20 1654)   PRN Meds:.acetaminophen **OR** acetaminophen, labetalol, ondansetron **OR** ondansetron (ZOFRAN) IV, polyethylene glycol   Anti-infectives (From admission, onward)    Start     Dose/Rate Route Frequency Ordered Stop   12/14/20 2300  cefTRIAXone (ROCEPHIN) 1 g in sodium chloride 0.9 % 100 mL IVPB        1 g 200 mL/hr over 30 Minutes Intravenous Every 24 hours 12/14/20 2213     12/14/20 2300  metroNIDAZOLE (FLAGYL) IVPB 500 mg        500 mg 100 mL/hr over 60 Minutes Intravenous Every 8 hours 12/14/20 2213            Objective:   Vitals:   12/19/20 0450 12/19/20 0909 12/19/20 1252 12/19/20 1651  BP: (!) 164/89 (!) 150/62 135/68 (!) 150/98  Pulse: 67 70 (!) 57 84  Resp: 20  16   Temp: 98.1 F (36.7 C) (!) 97.5 F (36.4 C) 97.6 F (36.4 C)   TempSrc:  Oral Oral   SpO2: 96% 93% 95% 94%  Weight:      Height:        Wt Readings from Last 3 Encounters:  12/14/20 60.5 kg  11/30/20 61.4 kg  04/20/18 61.4 kg     Intake/Output Summary (Last 24 hours) at 12/19/2020 1829 Last data filed at 12/19/2020 1252 Gross per 24 hour  Intake 1636 ml  Output 650 ml  Net 986 ml    Physical Exam  Gen:- Awake sleepy, appears comfortable, not really talkative HEENT:- .AT, No sclera icterus, HOH Neck-Supple Neck,No JVD,.  Lungs-no wheezing,, fair symmetrical air movement CV- S1, S2 normal, regular  Abd-  +ve B.Sounds, Abd Soft, No tenderness,    Extremity/Skin:- No  edema, pedal pulses present  Psych-baseline cognitive and memory deficits, sleepy, Neuro-generalized weakness, no new focal deficits, no tremors   Data Review:   Micro Results Recent Results (from the past 240 hour(s))  Culture, blood (routine x 2)     Status: None (Preliminary result)   Collection Time: 12/14/20  9:49 PM   Specimen: Left Antecubital; Blood  Result Value Ref Range Status   Specimen Description LEFT ANTECUBITAL  Final   Special Requests   Final    BOTTLES DRAWN AEROBIC AND ANAEROBIC Blood Culture adequate volume   Culture   Final    NO GROWTH 4 DAYS Performed at [facility], [address], [place], [place] [address]    Report Status PENDING  Incomplete  Culture, blood (routine x 2)     Status: None (Preliminary result)   Collection Time: 12/14/20 10:00 PM   Specimen: BLOOD LEFT HAND  Result Value Ref Range Status   Specimen Description BLOOD LEFT HAND  Final   Special Requests   Final    BOTTLES DRAWN AEROBIC AND ANAEROBIC Blood Culture adequate volume   Culture   Final    NO GROWTH 4 DAYS Performed  at [facility], [address] [place], [place] [address]    Report Status PENDING  Incomplete  Culture, Urine     Status: None   Collection Time: 12/14/20 10:45 PM   Specimen: Urine, Clean Catch  Result Value Ref Range Status   Specimen Description   Final    URINE, CLEAN CATCH Performed at [facility], [address], [place], [place] [address]    Special Requests   Final    NONE Performed at [facility], [address], [place], [place] [address]    Culture   Final    NO GROWTH Performed at [facility], [place] [address] [place], [place] [address]    Report Status 12/16/2020 FINAL  Final    Radiology Reports CT ABDOMEN PELVIS WO CONTRAST  Result Date: 12/15/2020 CLINICAL DATA:  87 year old female with left lower quadrant abdominal pain and GI bleed. EXAM: CT ABDOMEN AND PELVIS WITHOUT CONTRAST TECHNIQUE: Multidetector CT imaging of the abdomen and pelvis was performed following the standard protocol without IV contrast. COMPARISON:  CT abdomen pelvis dated 08/28/2017. FINDINGS: Evaluation of this exam is limited in the absence of intravenous contrast. Lower chest: Small bilateral pleural effusions with associated partial compressive atelectasis of the lower lobes. Pneumonia is not excluded clinical correlation is recommended. There is coronary vascular calcification. No intra-abdominal free air or free fluid. Hepatobiliary: The liver is unremarkable. No intrahepatic biliary dilatation. Several small stones within the gallbladder. The gallbladder is mildly distended. No pericholecystic fluid or evidence of acute cholecystitis by CT. Ultrasound may provide better evaluation if there is high clinical concern for acute cholecystitis. Pancreas: Unremarkable. No pancreatic ductal dilatation or surrounding inflammatory changes. Spleen: Normal in size without focal abnormality. Adrenals/Urinary Tract: The adrenal glands are unremarkable. The kidneys, visualized ureters, and urinary bladder  are unremarkable. Stomach/Bowel: There is severe sigmoid diverticulosis and diffuse colonic diverticula without active inflammatory changes. There is no bowel obstruction. The appendix is not visualized with certainty. No inflammatory changes identified in the right lower quadrant. Vascular/Lymphatic: Moderate aortoiliac atherosclerotic disease. The IVC is unremarkable. No portal venous gas. There is no adenopathy. Reproductive: Hysterectomy. No adnexal masses. Other: None Musculoskeletal: Osteopenia with degenerative changes of the spine and hips. L3  vertebroplasty. No acute osseous pathology. IMPRESSION: 1. No acute intra-abdominal or pelvic pathology. 2. Severe colonic diverticulosis. No bowel obstruction. 3. Cholelithiasis. 4. Small bilateral pleural effusions with associated partial compressive atelectasis of the lower lobes. Pneumonia is not excluded clinical correlation is recommended. 5. Aortic Atherosclerosis (ICD10-I70.0). Electronically Signed   By: Anner Crete M.D.   On: 12/15/2020 16:36   CT Head Wo Contrast  Result Date: 11/30/2020 CLINICAL DATA:  87 year old female with altered mental status. EXAM: CT HEAD WITHOUT CONTRAST TECHNIQUE: Contiguous axial images were obtained from the base of the skull through the vertex without intravenous contrast. COMPARISON:  Head CT dated 05/29/2011. FINDINGS: Brain: Mild age-related atrophy and chronic microvascular ischemic changes. There is no acute intracranial hemorrhage. No mass effect or midline shift. No extra-axial fluid collection. Vascular: No hyperdense vessel or unexpected calcification. Skull: Normal. Negative for fracture or focal lesion. Sinuses/Orbits: There is complete opacification of the left maxillary sinus with chronic remodeling and destruction of the medial wall. Bilateral mastoid effusions. No air-fluid level. Other: None IMPRESSION: 1. No acute intracranial pathology. 2. Mild age-related atrophy and chronic microvascular ischemic  changes. 3. Chronic left maxillary sinus disease. 4. Bilateral mastoid effusions. Electronically Signed   By: Anner Crete M.D.   On: 11/30/2020 16:20   DG CHEST PORT 1 VIEW  Result Date: 12/14/2020 CLINICAL DATA:  87 year old female with fever. EXAM: PORTABLE CHEST 1 VIEW COMPARISON:  Chest radiograph dated 12/30/2017. FINDINGS: No focal consolidation, pleural effusion, or pneumothorax. The cardiac silhouette is within limits. Atherosclerotic calcification of the aorta. Osteopenia with degenerative changes of the spine. No acute osseous pathology. IMPRESSION: No active disease. Electronically Signed   By: Anner Crete M.D.   On: 12/14/2020 20:31   DG Chest Portable 1 View  Result Date: 11/30/2020 CLINICAL DATA:  Altered mental status. EXAM: PORTABLE CHEST 1 VIEW COMPARISON:  Single-view of the chest 12/30/2017 and CT chest 12/31/2017. FINDINGS: Trace bilateral pleural effusions and mild basilar atelectasis. Heart size is normal. Aortic atherosclerosis. No pneumothorax. No acute or focal bony abnormality. IMPRESSION: Trace bilateral pleural effusions and mild bibasilar atelectasis. Aortic Atherosclerosis (ICD10-I70.0). Electronically Signed   By: Inge Rise M.D.   On: 11/30/2020 15:42     CBC Recent Labs  Lab 12/14/20 1751 12/14/20 1756 12/14/20 2316 12/15/20 0459 12/16/20 0614 12/17/20 0632 12/19/20 0920  WBC 12.5*  --   --  8.5 17.0* 11.0* 11.5*  HGB 8.4*   < > 7.9* 7.4* 10.8* 12.0 9.9*  HCT 26.1*   < > 25.0* 23.4* 32.7* 38.5 31.1*  PLT 251  --   --  193 170 173 96*  MCV 99.2  --   --  102.2* 94.5 98.7 96.9  MCH 31.9  --   --  32.3 31.2 30.8 30.8  MCHC 32.2  --   --  31.6 33.0 31.2 31.8  RDW 15.8*  --   --  15.9* 17.8* 18.2* 17.3*  LYMPHSABS 0.6*  --   --   --   --   --   --   MONOABS 1.4*  --   --   --   --   --   --   EOSABS 0.1  --   --   --   --   --   --   BASOSABS 0.0  --   --   --   --   --   --    < > = values in this interval not displayed.  Chemistries   Recent Labs  Lab 12/14/20 1751 12/14/20 1756 12/15/20 0459 12/16/20 0614 12/17/20 0632 12/19/20 0920  NA 131* 133* 135 137 137 138  K 4.3 4.4 4.1 3.3* 3.9 2.6*  CL 103 104 107 108 108 111  CO2 19*  --  20* 19* 15* 18*  GLUCOSE 143* 139* 126* 99 114* 138*  BUN 39* 33* 35* 32* 33* 43*  CREATININE 1.61* 1.80* 1.60* 1.42* 1.49* 1.75*  CALCIUM 8.6*  --  8.1* 8.0* 8.4* 8.1*  AST 25  --   --   --   --   --   ALT 18  --   --   --   --   --   ALKPHOS 78  --   --   --   --   --   BILITOT 0.5  --   --   --   --   --    ------------------------------------------------------------------------------------------------------------------ No results for input(s): CHOL, HDL, LDLCALC, TRIG, CHOLHDL, LDLDIRECT in the last 72 hours.  No results found for: HGBA1C ------------------------------------------------------------------------------------------------------------------ No results for input(s): TSH, T4TOTAL, T3FREE, THYROIDAB in the last 72 hours.  Invalid input(s): FREET3 ------------------------------------------------------------------------------------------------------------------ No results for input(s): VITAMINB12, FOLATE, FERRITIN, TIBC, IRON, RETICCTPCT in the last 72 hours.   Coagulation profile Recent Labs  Lab 12/14/20 1754  INR 1.1    No results for input(s): DDIMER in the last 72 hours.  Cardiac Enzymes No results for input(s): CKMB, TROPONINI, MYOGLOBIN in the last 168 hours.  Invalid input(s): CK ------------------------------------------------------------------------------------------------------------------    Component Value Date/Time   BNP 415.0 (H) 04/20/2018 1534     Roxan Hockey M.D on 12/19/2020 at 6:29 PM  Go to [email or web address] - for contact info  [facility] - Office  [phone]

## 2020-12-20 LAB — RENAL FUNCTION PANEL
Albumin: 2.7 g/dL — ABNORMAL LOW (ref 3.5–5.0)
Anion gap: 9 (ref 5–15)
BUN: 44 mg/dL — ABNORMAL HIGH (ref 8–23)
CO2: 19 mmol/L — ABNORMAL LOW (ref 22–32)
Calcium: 8.3 mg/dL — ABNORMAL LOW (ref 8.9–10.3)
Chloride: 110 mmol/L (ref 98–111)
Creatinine, Ser: 1.8 mg/dL — ABNORMAL HIGH (ref 0.44–1.00)
GFR, Estimated: 26 mL/min — ABNORMAL LOW (ref >60)
Glucose, Bld: 86 mg/dL (ref 70–99)
Phosphorus: 3.1 mg/dL (ref 2.5–4.6)
Potassium: 3.1 mmol/L — ABNORMAL LOW (ref 3.5–5.1)
Sodium: 138 mmol/L (ref 135–145)

## 2020-12-20 LAB — CBC
HCT: 29.4 % — ABNORMAL LOW (ref 36.0–46.0)
Hemoglobin: 9.6 g/dL — ABNORMAL LOW (ref 12.0–15.0)
MCH: 31.3 pg (ref 26.0–34.0)
MCHC: 32.7 g/dL (ref 30.0–36.0)
MCV: 95.8 fL (ref 80.0–100.0)
Platelets: 88 10*3/uL — ABNORMAL LOW (ref 150–400)
RBC: 3.07 MIL/uL — ABNORMAL LOW (ref 3.87–5.11)
RDW: 17.3 % — ABNORMAL HIGH (ref 11.5–15.5)
WBC: 13 10*3/uL — ABNORMAL HIGH (ref 4.0–10.5)
nRBC: 0 % (ref 0.0–0.2)

## 2020-12-20 LAB — MRSA NEXT GEN BY PCR, NASAL: MRSA by PCR Next Gen: NOT DETECTED

## 2020-12-20 LAB — MAGNESIUM: Magnesium: 2.5 mg/dL — ABNORMAL HIGH (ref 1.7–2.4)

## 2020-12-20 MED ORDER — POTASSIUM CHLORIDE 10 MEQ/100ML IV SOLN
10.0000 meq | INTRAVENOUS | Status: AC
  Administered 2020-12-20 (×4): 10 meq via INTRAVENOUS
  Filled 2020-12-20 (×4): qty 100

## 2020-12-20 MED ORDER — POTASSIUM CHLORIDE CRYS ER 20 MEQ PO TBCR
40.0000 meq | EXTENDED_RELEASE_TABLET | Freq: Once | ORAL | Status: AC
  Administered 2020-12-20: 40 meq via ORAL
  Filled 2020-12-20: qty 2

## 2020-12-20 NOTE — Progress Notes (Signed)
[employer]ufacturing [employer] [facility])  Hospital Liaison: RN note         This patient has been referred to our palliative care services in the community at [facility] will continue to follow for any discharge planning needs and to coordinate continuation of palliative care in the outpatient setting.    If you have questions or need assistance, please call [phone] or contact the hospital Liaison listed on AMION.      Thank you for this referral.         Farrel Gordon, RN, [facility] Liaison   [phone]

## 2020-12-20 NOTE — TOC Progression Note (Signed)
Transition of Care [facility]) - Progression Note    Patient Details  Name: DANYLAH HOLDEN MRN: [id number] Date of Birth: 04/17/1924  [facility]) CM/SW Contact  Natasha Bence, LCSW Phone Number: 12/20/2020, 3:59 PM  Clinical Narrative:    [facility] reported that they are able to take patient back on 12/21/2020. [facility] reported that they will take her with HHPT and palliative. [facility] also requested hospital bed. CSW referred patient for palliative to [facility]. CSW also referred patient to [facility] for hospital bed. HH to come. TOC to follow.    Expected Discharge Plan: (P) Assisted Living Barriers to Discharge: (P) Continued Medical Work up  Expected Discharge Plan and Services Expected Discharge Plan: (P) Assisted Living       Living arrangements for the past 2 months: Assisted Living Facility                                       Social Determinants of Health (SDOH) Interventions    Readmission Risk Interventions No flowsheet data found.

## 2020-12-20 NOTE — Care Management Important Message (Signed)
Important Message  Patient Details  Name: Cindy Schultz MRN: [id number] Date of Birth: 1924-02-07   Medicare Important Message Given:  Yes     Tommy Medal 12/20/2020, 12:04 PM

## 2020-12-20 NOTE — Progress Notes (Addendum)
Patient Demographics:    Cindy Schultz, is a 87 y.o. female, DOB - 23-Nov-1923, ZOX:[id number]  Admit date - 12/14/2020   Admitting Physician [name], MD  Outpatient Primary MD for the patient is Pcp, No  LOS - 6  Chief Complaint  Patient presents with   Rectal Bleeding        Subjective:    Cindy Schultz today has no fevers, no emesis,  No chest pain,   12/20/20 -Oral intake is unreliable due to persistent lethargy --Given very advanced age and comorbid conditions and poor oral intake due to metabolic encephalopathy and lethargy patient may be hospice appropriate  Assessment  & Plan :    Principal Problem:   GI bleed Active Problems:   HTN (hypertension)   CKD (chronic kidney disease), stage IIIB   SIRS (systemic inflammatory response syndrome) (HCC)  Brief Summary:-  87 y.o. female with medical history significant for hypertension, CKD 3B, DVT , recent COVID-19 infection , chronic anemia and dementia and recurrent diverticulitis/GI bleed readmitted on 12/14/2020 with concerns for acute GI bleed bleeding -Given very advanced age and comorbid conditions and poor oral intake due to metabolic encephalopathy and lethargy patient may be hospice appropriate -[facility] reported that they are able to take patient back on 12/21/2020.  with HHPT and [facility] palliative to follow at ALF  A/p  1)Acute GI bleed--- patient with painless rectal bleeding, suspect diverticular etiology -GI consult appreciated, GI requested CT abdomen and pelvis which demonstrates severe colonic diverticulosis and cholelithiasis -GI team call treatment options with patient's daughter [name], at this time family would like to avoid invasive procedures such as colonoscopy/EGD, etc.  They wish for comfort measures/noninvasive therapy.  They are agreeable to transfusion if needed.  -Last colonoscopy by Dr. [name]  in 2018 showed multiple diverticula in the entire colon as well as polyps -Hemoglobin drifting down again currently 9.6 - - 2)Dehydration and HypoNatremia/Hypokalemia--- resolved with IV fluids, replaced potassium   3)CKD stage - III B -Renal function appears to be at baseline -renally adjust medications, avoid nephrotoxic agents / dehydration  / hypotension  4)HTN-continue hydralazine p.o. and as needed labetalol IV  5)SIRS/acute metabolic encephalopathy--SIRS pathophysiology resolving, no further tachycardia or tachypnea and no fevers Patient completed Rocephin and Flagyl mostly for intra-abdominal concerns -Lung findings on CT abdomen and pelvis may be due to recent COVID-19 infection  6)Dementia--- Per family patient has been having more cognitive and memory deficits lately -c/n  Aricept and sertraline  7)social/ethics--DNR/DNI,  patient's daughter [name] at [phone] primary decision maker, -Palliative care consult appreciated --Given very advanced age and comorbid conditions and poor oral intake due to metabolic encephalopathy and lethargy patient may be hospice appropriate  8)Acute on chronic anemia--hemoglobin is initially stable after transfusion of 1 units of PRBC on 12/15/2020  -Hemoglobin drifting down again --B12 and folate acid are not low  9)Generalized Weakness and Deconditioning-----[facility] reported that they are able to take patient back on 12/21/2020.  with HHPT and [facility] palliative to follow at ALF - Disposition/Need for in-Hospital Stay- patient unable to be discharged at this time due to --??  Hospice appropriate-please see above --[facility] reported that they are able to take patient back on 12/21/2020.  with HHPT and [facility] palliative to follow at ALF  Status is: Inpatient  Remains inpatient appropriate because: Please see disposition above  Disposition: The patient is from: ALF              Anticipated d/c is to: ALF  [facility]              Anticipated d/c date is: 1 day              Patient currently is not medically stable to d/c. Barriers: Not Clinically Stable-  -[facility] reported that they are able to take patient back on 12/21/2020.  with HHPT and [facility] palliative to follow at ALF  Code Status : -  Code Status: DNR   Family Communication:    -Patient's daughter [name] is primary decision-maker  Consults  :  Gi  DVT Prophylaxis  :   - SCDs   SCDs Start: 12/14/20 2056  Lab Results  Component Value Date   PLT 88 (L) 12/20/2020    Inpatient Medications  Scheduled Meds:  donepezil  5 mg Oral QHS   dorzolamide-timolol  1 drop Both Eyes BID   hydrALAZINE  50 mg Oral TID   melatonin  6 mg Oral QHS   potassium chloride  40 mEq Oral Once   senna-docusate  2 tablet Oral QHS   sertraline  50 mg Oral Daily   sodium bicarbonate  650 mg Oral BID   Continuous Infusions:  potassium chloride     PRN Meds:.acetaminophen **OR** acetaminophen, labetalol, ondansetron **OR** ondansetron (ZOFRAN) IV, polyethylene glycol   Anti-infectives (From admission, onward)    Start     Dose/Rate Route Frequency Ordered Stop   12/14/20 2300  cefTRIAXone (ROCEPHIN) 1 g in sodium chloride 0.9 % 100 mL IVPB        1 g 200 mL/hr over 30 Minutes Intravenous Every 24 hours 12/14/20 2213 12/19/20 2304   12/14/20 2300  metroNIDAZOLE (FLAGYL) IVPB 500 mg        500 mg 100 mL/hr over 60 Minutes Intravenous Every 8 hours 12/14/20 2213 12/20/20 0021         Objective:   Vitals:   12/19/20 2126 12/20/20 0454 12/20/20 1036 12/20/20 1450  BP: (!) 163/72 (!) 154/90 (!) 157/73 (!) 143/71  Pulse: 73 93  60  Resp: 18 20  20   Temp: 98.8 F (37.1 C) 98.6 F (37 C)  98.5 F (36.9 C)  TempSrc:    Oral  SpO2: 93% 94%  96%  Weight:      Height:        Wt Readings from Last 3 Encounters:  12/14/20 60.5 kg  11/30/20 61.4 kg  04/20/18 61.4 kg    Intake/Output Summary (Last 24 hours) at 12/20/2020  1826 Last data filed at 12/20/2020 1100 Gross per 24 hour  Intake 1800 ml  Output --  Net 1800 ml   Physical Exam  Gen:- Awake sleepy, appears comfortable, not really talkative HEENT:- [place].AT, No sclera icterus, HOH Neck-Supple Neck,No JVD,.  Lungs-no wheezing,, fair symmetrical air movement CV- S1, S2 normal, regular  Abd-  +ve B.Sounds, Abd Soft, No tenderness,    Extremity/Skin:- No  edema, pedal pulses present  Psych-baseline cognitive and memory deficits, sleepy, Neuro-generalized weakness, no new focal deficits, no tremors   Data Review:   Micro Results Recent Results (from the past 240 hour(s))  Culture, blood (routine x 2)     Status: None (Preliminary result)   Collection Time:  12/14/20  9:49 PM   Specimen: Left Antecubital; Blood  Result Value Ref Range Status   Specimen Description LEFT ANTECUBITAL  Final   Special Requests   Final    BOTTLES DRAWN AEROBIC AND ANAEROBIC Blood Culture adequate volume   Culture   Final    NO GROWTH 4 DAYS Performed at [facility], [address], [place], [place] [address]    Report Status PENDING  Incomplete  Culture, blood (routine x 2)     Status: None (Preliminary result)   Collection Time: 12/14/20 10:00 PM   Specimen: BLOOD LEFT HAND  Result Value Ref Range Status   Specimen Description BLOOD LEFT HAND  Final   Special Requests   Final    BOTTLES DRAWN AEROBIC AND ANAEROBIC Blood Culture adequate volume   Culture   Final    NO GROWTH 4 DAYS Performed at [facility], [address] [place], [place] [address]    Report Status PENDING  Incomplete  Culture, Urine     Status: None   Collection Time: 12/14/20 10:45 PM   Specimen: Urine, Clean Catch  Result Value Ref Range Status   Specimen Description   Final    URINE, CLEAN CATCH Performed at [facility], [address], [place], [place] [address]    Special Requests   Final    NONE Performed at [facility], [address], [place], [place] [address]    Culture    Final    NO GROWTH Performed at [facility], [place] [address], [place],  [address]    Report Status 12/16/2020 FINAL  Final    Radiology Reports CT ABDOMEN PELVIS WO CONTRAST  Result Date: 12/15/2020 CLINICAL DATA:  87 year old female with left lower quadrant abdominal pain and GI bleed. EXAM: CT ABDOMEN AND PELVIS WITHOUT CONTRAST TECHNIQUE: Multidetector CT imaging of the abdomen and pelvis was performed following the standard protocol without IV contrast. COMPARISON:  CT abdomen pelvis dated 08/28/2017. FINDINGS: Evaluation of this exam is limited in the absence of intravenous contrast. Lower chest: Small bilateral pleural effusions with associated partial compressive atelectasis of the lower lobes. Pneumonia is not excluded clinical correlation is recommended. There is coronary vascular calcification. No intra-abdominal free air or free fluid. Hepatobiliary: The liver is unremarkable. No intrahepatic biliary dilatation. Several small stones within the gallbladder. The gallbladder is mildly distended. No pericholecystic fluid or evidence of acute cholecystitis by CT. Ultrasound may provide better evaluation if there is high clinical concern for acute cholecystitis. Pancreas: Unremarkable. No pancreatic ductal dilatation or surrounding inflammatory changes. Spleen: Normal in size without focal abnormality. Adrenals/Urinary Tract: The adrenal glands are unremarkable. The kidneys, visualized ureters, and urinary bladder are unremarkable. Stomach/Bowel: There is severe sigmoid diverticulosis and diffuse colonic diverticula without active inflammatory changes. There is no bowel obstruction. The appendix is not visualized with certainty. No inflammatory changes identified in the right lower quadrant. Vascular/Lymphatic: Moderate aortoiliac atherosclerotic disease. The IVC is unremarkable. No portal venous gas. There is no adenopathy. Reproductive: Hysterectomy. No adnexal masses. Other: None  Musculoskeletal: Osteopenia with degenerative changes of the spine and hips. L3 vertebroplasty. No acute osseous pathology. IMPRESSION: 1. No acute intra-abdominal or pelvic pathology. 2. Severe colonic diverticulosis. No bowel obstruction. 3. Cholelithiasis. 4. Small bilateral pleural effusions with associated partial compressive atelectasis of the lower lobes. Pneumonia is not excluded clinical correlation is recommended. 5. Aortic Atherosclerosis (ICD10-I70.0). Electronically Signed   By: Anner Crete M.D.   On: 12/15/2020 16:36   CT Head  Wo Contrast  Result Date: 11/30/2020 CLINICAL DATA:  87 year old female with altered mental status. EXAM: CT HEAD WITHOUT CONTRAST TECHNIQUE: Contiguous axial images were obtained from the base of the skull through the vertex without intravenous contrast. COMPARISON:  Head CT dated 05/29/2011. FINDINGS: Brain: Mild age-related atrophy and chronic microvascular ischemic changes. There is no acute intracranial hemorrhage. No mass effect or midline shift. No extra-axial fluid collection. Vascular: No hyperdense vessel or unexpected calcification. Skull: Normal. Negative for fracture or focal lesion. Sinuses/Orbits: There is complete opacification of the left maxillary sinus with chronic remodeling and destruction of the medial wall. Bilateral mastoid effusions. No air-fluid level. Other: None IMPRESSION: 1. No acute intracranial pathology. 2. Mild age-related atrophy and chronic microvascular ischemic changes. 3. Chronic left maxillary sinus disease. 4. Bilateral mastoid effusions. Electronically Signed   By: Anner Crete M.D.   On: 11/30/2020 16:20   DG CHEST PORT 1 VIEW  Result Date: 12/14/2020 CLINICAL DATA:  87 year old female with fever. EXAM: PORTABLE CHEST 1 VIEW COMPARISON:  Chest radiograph dated 12/30/2017. FINDINGS: No focal consolidation, pleural effusion, or pneumothorax. The cardiac silhouette is within limits. Atherosclerotic calcification of the  aorta. Osteopenia with degenerative changes of the spine. No acute osseous pathology. IMPRESSION: No active disease. Electronically Signed   By: Anner Crete M.D.   On: 12/14/2020 20:31   DG Chest Portable 1 View  Result Date: 11/30/2020 CLINICAL DATA:  Altered mental status. EXAM: PORTABLE CHEST 1 VIEW COMPARISON:  Single-view of the chest 12/30/2017 and CT chest 12/31/2017. FINDINGS: Trace bilateral pleural effusions and mild basilar atelectasis. Heart size is normal. Aortic atherosclerosis. No pneumothorax. No acute or focal bony abnormality. IMPRESSION: Trace bilateral pleural effusions and mild bibasilar atelectasis. Aortic Atherosclerosis (ICD10-I70.0). Electronically Signed   By: Inge Rise M.D.   On: 11/30/2020 15:42     CBC Recent Labs  Lab 12/14/20 1751 12/14/20 1756 12/15/20 0459 12/16/20 0614 12/17/20 0632 12/19/20 0920 12/20/20 0900  WBC 12.5*  --  8.5 17.0* 11.0* 11.5* 13.0*  HGB 8.4*   < > 7.4* 10.8* 12.0 9.9* 9.6*  HCT 26.1*   < > 23.4* 32.7* 38.5 31.1* 29.4*  PLT 251  --  193 170 173 96* 88*  MCV 99.2  --  102.2* 94.5 98.7 96.9 95.8  MCH 31.9  --  32.3 31.2 30.8 30.8 31.3  MCHC 32.2  --  31.6 33.0 31.2 31.8 32.7  RDW 15.8*  --  15.9* 17.8* 18.2* 17.3* 17.3*  LYMPHSABS 0.6*  --   --   --   --   --   --   MONOABS 1.4*  --   --   --   --   --   --   EOSABS 0.1  --   --   --   --   --   --   BASOSABS 0.0  --   --   --   --   --   --    < > = values in this interval not displayed.    Chemistries  Recent Labs  Lab 12/14/20 1751 12/14/20 1756 12/15/20 0459 12/16/20 0614 12/17/20 0632 12/19/20 0920 12/20/20 0432  NA 131*   < > 135 137 137 138 138  K 4.3   < > 4.1 3.3* 3.9 2.6* 3.1*  CL 103   < > 107 108 108 111 110  CO2 19*  --  20* 19* 15* 18* 19*  GLUCOSE 143*   < > 126* 99 114* 138*  86  BUN 39*   < > 35* 32* 33* 43* 44*  CREATININE 1.61*   < > 1.60* 1.42* 1.49* 1.75* 1.80*  CALCIUM 8.6*  --  8.1* 8.0* 8.4* 8.1* 8.3*  MG  --   --   --   --   --    --  2.5*  AST 25  --   --   --   --   --   --   ALT 18  --   --   --   --   --   --   ALKPHOS 78  --   --   --   --   --   --   BILITOT 0.5  --   --   --   --   --   --    < > = values in this interval not displayed.   ------------------------------------------------------------------------------------------------------------------ No results for input(s): CHOL, HDL, LDLCALC, TRIG, CHOLHDL, LDLDIRECT in the last 72 hours.  No results found for: HGBA1C ------------------------------------------------------------------------------------------------------------------ No results for input(s): TSH, T4TOTAL, T3FREE, THYROIDAB in the last 72 hours.  Invalid input(s): FREET3 ------------------------------------------------------------------------------------------------------------------ No results for input(s): VITAMINB12, FOLATE, FERRITIN, TIBC, IRON, RETICCTPCT in the last 72 hours.   Coagulation profile Recent Labs  Lab 12/14/20 1754  INR 1.1    No results for input(s): DDIMER in the last 72 hours.  Cardiac Enzymes No results for input(s): CKMB, TROPONINI, MYOGLOBIN in the last 168 hours.  Invalid input(s): CK ------------------------------------------------------------------------------------------------------------------    Component Value Date/Time   BNP 415.0 (H) 04/20/2018 1534     Roxan Hockey M.D on 12/20/2020 at 6:26 PM  Go to [email or web address] - for contact info  [facility] - Office  [phone]

## 2020-12-20 NOTE — Progress Notes (Signed)
Palliative: Cindy Schultz is sitting up in bed.  She makes but does not keep eye contact.  She appears acutely/chronically ill and somewhat frail.  Her daughter, Cindy Schultz, is at bedside.  Cindy Schultz is very hard of hearing and Cindy Schultz has a white board at bedside to help with communication.  I share with Cindy Schultz, "you have been sick".  During our visit, she states repeatedly "I have not been sick".  Reassurance offered.  Cindy Schultz and I talked about Cindy Schultz's acute health concerns.  She states that they are awaiting evaluation from [facility] where Cindy Schultz has lived for approximately 3 years.  Cindy Schultz states that she would be agreeable for Cindy Schultz to return to [place] with "treat the treatable" (not comfort care) hospice care.  I believe Cindy Schultz uses [facility] as their in-house hospice provider.  Cindy Schultz shares that if Cindy Schultz is too weak, unable to return to [place], that her preference would be short-term rehab if qualified at [facility] or [facility].  Conference with attending, bedside nursing staff, transition of care team related to patient condition, needs, goals of care, disposition.  Plan: Continue to treat the treatable but no CPR or intubation.  Return to [facility] with "treat the treatable" hospice care if accepted.  47 minutes Cindy Axe, NP Palliative medicine team Team phone [phone] Greater than 50% of this time was spent counseling and coordinating care related to the above assessment and plan.

## 2020-12-21 ENCOUNTER — Telehealth: Payer: Self-pay

## 2020-12-21 LAB — CBC
HCT: 30.7 % — ABNORMAL LOW (ref 36.0–46.0)
Hemoglobin: 10 g/dL — ABNORMAL LOW (ref 12.0–15.0)
MCH: 31.2 pg (ref 26.0–34.0)
MCHC: 32.6 g/dL (ref 30.0–36.0)
MCV: 95.6 fL (ref 80.0–100.0)
Platelets: 96 10*3/uL — ABNORMAL LOW (ref 150–400)
RBC: 3.21 MIL/uL — ABNORMAL LOW (ref 3.87–5.11)
RDW: 17.2 % — ABNORMAL HIGH (ref 11.5–15.5)
WBC: 11 10*3/uL — ABNORMAL HIGH (ref 4.0–10.5)
nRBC: 0 % (ref 0.0–0.2)

## 2020-12-21 LAB — BASIC METABOLIC PANEL
Anion gap: 8 (ref 5–15)
BUN: 47 mg/dL — ABNORMAL HIGH (ref 8–23)
CO2: 17 mmol/L — ABNORMAL LOW (ref 22–32)
Calcium: 8.1 mg/dL — ABNORMAL LOW (ref 8.9–10.3)
Chloride: 113 mmol/L — ABNORMAL HIGH (ref 98–111)
Creatinine, Ser: 1.78 mg/dL — ABNORMAL HIGH (ref 0.44–1.00)
GFR, Estimated: 26 mL/min — ABNORMAL LOW (ref >60)
Glucose, Bld: 129 mg/dL — ABNORMAL HIGH (ref 70–99)
Potassium: 4.4 mmol/L (ref 3.5–5.1)
Sodium: 138 mmol/L (ref 135–145)

## 2020-12-21 LAB — CULTURE, BLOOD (ROUTINE X 2)
Culture: NO GROWTH
Culture: NO GROWTH
Special Requests: ADEQUATE
Special Requests: ADEQUATE

## 2020-12-21 MED ORDER — SODIUM BICARBONATE 650 MG PO TABS: 650.0000 mg | ORAL_TABLET | Freq: Two times a day (BID) | ORAL | Status: AC

## 2020-12-21 MED ORDER — POLYETHYLENE GLYCOL 3350 17 G PO PACK: 17.0000 g | PACK | Freq: Every day | ORAL | 0 refills | Status: AC | PRN

## 2020-12-21 NOTE — Clinical Social Work Note (Signed)
Patient requires frequent re-positioning of the body in ways that cannot be achieved with an ordinary bed or wedge pillow to eliminate pain and the head of the bed needs to be evaluated more than 30 degrees most of the time.    Ambrose Pancoast D

## 2020-12-21 NOTE — Progress Notes (Signed)
Physical Therapy Treatment Patient Details Name: Cindy Schultz MRN: [id number] DOB: 02/13/1924 Today's Date: 12/21/2020    History of Present Illness Cindy Schultz is a 87 y.o. female with medical history significant for hypertension, CKD 4, DVT, hard of hearing.  Patient was brought to the ED from [facility] with reports of episode of blood in stool noted this morning   Per triage notes, prior to arrival patient started dry heaving, was noted to have blood in her sputum and another episode of bloody stool.    At the time of my evaluation, patient is awake, but grunting continuously.  Patient is hard of hearing.  I wrote down some simple questions, she opened her eyes and appeared to read them, but she did not answer the questions.    PT Comments    Pt  lying supine in bed sleeping upon entering.  Difficulty communicating with patient today as she was unable to write legibly on her dry erase communication board. Pt did not face any pain or appear in any distress.  Pt required mod-max assist to get to EOB but then able to maintain seated balance independently.  Worked on standing 3X before able to stand briefly for approx 10 seconds before having to sit back down.  Pt then was very forward bend semi-standing.  Therapist was able to thoroughly cleanse as she had a large BM.  Pt then max transferred over the recliner and required mod assist to position self into this.  Pt appeared to be comfortable.  Nursing assistant replaced pure wick and informed of BM.           Precautions / Restrictions Precautions Precautions: Fall Precaution Comments: pt is nonverball; uses commication board and then has difficulty writing as well.    Mobility  Bed Mobility Overal bed mobility: Needs Assistance Bed Mobility: Supine to Sit     Supine to sit: Mod assist;HOB elevated          Transfers Overall transfer level: Needs assistance   Transfers: Sit to/from Stand;Stand Pivot Transfers Sit to Stand:  Mod assist Stand pivot transfers: Mod assist;Max assist       General transfer comment: Pt able to stand for 10 seconds max after 3 trails in order for therapist to clean her then required max transfer to chair as unable to complete this task today. visual cues for communication  Ambulation/Gait  Pt unable to complete this task today (was able to complete a few steps last week).            Balance                                            Cognition Arousal/Alertness: Awake/alert Behavior During Therapy: Flat affect Overall Cognitive Status: History of cognitive impairments - at baseline                                               Pertinent Vitals/Pain Pain Assessment: Faces Pain Score: 0-No pain     PT Goals (current goals can now be found in the care plan section)      Frequency           PT Plan Current plan remains appropriate    Co-evaluation  AM-PAC PT "6 Clicks" Mobility   Outcome Measure  Help needed turning from your back to your side while in a flat bed without using bedrails?: A Lot Help needed moving from lying on your back to sitting on the side of a flat bed without using bedrails?: A Lot Help needed moving to and from a bed to a chair (including a wheelchair)?: A Lot Help needed standing up from a chair using your arms (e.g., wheelchair or bedside chair)?: A Lot Help needed to walk in hospital room?: A Lot Help needed climbing 3-5 steps with a railing? : Total 6 Click Score: 11    End of Session Equipment Utilized During Treatment: Gait belt Activity Tolerance: Other (comment) Patient left: in chair;with call bell/phone within reach;with chair alarm set Nurse Communication: Mobility status PT Visit Diagnosis: Other abnormalities of gait and mobility (R26.89);Unsteadiness on feet (R26.81);Muscle weakness (generalized) (M62.81)     Time: 1110-1150 PT Time Calculation (min) (ACUTE  ONLY): 40 min  Charges:  $Therapeutic Activity: 23-37 mins                     Teena Irani, PTA/CLT [place], Evagelia Knack B 12/21/2020, 2:46 PM

## 2020-12-21 NOTE — TOC Transition Note (Signed)
Transition of Care [facility]) - CM/SW Discharge Note   Patient Details  Name: Cindy Schultz MRN: [id number] Date of Birth: Mar 10, 1924  [facility] CM/SW Contact:  Ihor Gully, LCSW Phone Number: 12/21/2020, 3:16 PM   Clinical Narrative:    Daughter advised of discharge. Discharge clinicals sent to facility. Hospital bed orders in and bed to be delivered to facility today. Facility to transport patient.  TOC signing off.    Final next level of care: Assisted Living Barriers to Discharge: No Barriers Identified   Patient Goals and CMS Choice        Discharge Placement                       Discharge Plan and Services                DME Arranged: Hospital bed DME Agency: [employer] Date DME Agency Contacted: 12/21/20 Time DME Agency Contacted: 1516 Representative spoke with at DME Agency: [place] ([place]) Interventions     Readmission Risk Interventions No flowsheet data found.

## 2020-12-21 NOTE — Telephone Encounter (Signed)
Received verbal ok for Palliative care to follow patient at facility. Referring MD: Dr. Mitchel Honour.

## 2020-12-21 NOTE — NC FL2 (Signed)
[place] LEVEL OF CARE SCREENING TOOL     IDENTIFICATION  Patient Name: Cindy Schultz Birthdate: 10-29-1923 Sex: female Admission Date (Current Location): 12/14/2020  [facility] and [place] Number:  [employer] and Address:  [place] [address] [place]      Provider Number: [id number]  Attending Physician Name and Address:  Kathie Dike, MD  Relative Name and Phone Number:  Annye English (Daughter)   [phone]    Current Level of Care: Hospital Recommended Level of Care: Assisted Living Facility Prior Approval Number:    Date Approved/Denied:   PASRR Number:    Discharge Plan: Domiciliary (Rest home)    Current Diagnoses: Patient Active Problem List   Diagnosis Date Noted   CKD (chronic kidney disease), stage IIIB 12/15/2020   SIRS (systemic inflammatory response syndrome) ([place]) 63/06/6008   Acute metabolic encephalopathy 93/23/5573   AMS (altered mental status) 11/30/2020   COVID-19 virus infection 11/30/2020   Sepsis ([place]) 12/30/2017   Pressure injury of skin 12/30/2017   Cellulitis of left leg 12/30/2017   HCAP (healthcare-associated pneumonia) 12/30/2017   Glaucoma (increased eye pressure) 12/30/2017   Compression fracture of lumbar spine, non-traumatic, initial encounter ([place]) 12/17/2017   Back pain 12/12/2017   Abdominal pain 08/28/2017   Primary osteoarthritis of right hip 07/19/2017   Tear of right rotator cuff 07/19/2017   CKD (chronic kidney disease) stage 3, GFR 30-59 ml/min ([place]) 05/22/2017   Macrocytosis    Diverticulosis of large intestine with hemorrhage    Rectal bleed 05/19/2017   CKD (chronic kidney disease) stage 4, GFR 15-29 ml/min ([name]) 05/19/2017   LLQ pain 03/08/2017   Colon polyp 03/08/2017   Constipation 03/08/2017   Acute blood loss anemia 09/10/2014   GIB (gastrointestinal bleeding) 09/08/2014   Occlusion and stenosis of carotid artery without mention of cerebral  infarction 12/14/2013   Osteoarthritis of left knee 04/23/2013   Thumb pain 10/22/2012   De Quervain's syndrome (tenosynovitis) 10/22/2012   CMC DJD(carpometacarpal degenerative joint disease), localized primary 10/22/2012   CTS (carpal tunnel syndrome) 10/22/2012   Trigger thumb of right hand 10/22/2012   Pes anserinus bursitis of right knee 06/26/2012   OA (osteoarthritis) of knee 06/26/2012   Leg pain, left 06/26/2012   Radicular pain of left lower extremity 06/26/2012   Diverticulosis of colon with hemorrhage 09/22/2011   Syncopal episodes 09/20/2011   GI bleed 09/19/2011   HTN (hypertension) 09/19/2011   Anemia due to blood loss, acute 09/19/2011   Abnormality of gait 01/03/2011   Personal history of fall 12/27/2010   DISLOCATION CLOSED SHOULDER NEC 04/25/2010   ANSERINE BURSITIS, LEFT 02/13/2010   KNEE PAIN 12/22/2009   LEG PAIN, BILATERAL 12/22/2009   TOTAL KNEE FOLLOW-UP 12/22/2009   HIP PAIN 02/07/2009   LOW BACK PAIN 02/07/2009    Orientation RESPIRATION BLADDER Height & Weight     Self  Normal Incontinent Weight: 133 lb 6.1 oz (60.5 kg) Height:  5\' 4"  (162.6 cm)  BEHAVIORAL SYMPTOMS/MOOD NEUROLOGICAL BOWEL NUTRITION STATUS      Incontinent  (heart healthy)  AMBULATORY STATUS COMMUNICATION OF NEEDS Skin   Limited Assist Verbally Normal                       Personal Care Assistance Level of Assistance  Bathing, Feeding, Dressing Bathing Assistance: Limited assistance Feeding assistance: Independent Dressing Assistance: Limited assistance     Functional Limitations Info  Sight, Hearing, Speech Sight Info:  Adequate Hearing Info: Adequate Speech Info: Adequate    SPECIAL CARE FACTORS FREQUENCY                       Contractures Contractures Info: Not present    Additional Factors Info  Code Status, Allergies Code Status Info: DNR Allergies Info: Codeine, Aspirin, Pepto-bismol, hydrocodone           Current Medications (12/21/2020):   This is the current hospital active medication list Current Facility-Administered Medications  Medication Dose Route Frequency Provider Last Rate Last Admin   acetaminophen (TYLENOL) tablet 650 mg  650 mg Oral Q6H PRN Emokpae, Ejiroghene E, MD       Or   acetaminophen (TYLENOL) suppository 650 mg  650 mg Rectal Q6H PRN Emokpae, Ejiroghene E, MD       donepezil (ARICEPT) tablet 5 mg  5 mg Oral QHS Emokpae, Ejiroghene E, MD   5 mg at 12/20/20 2020   dorzolamide-timolol (COSOPT) 22.3-6.8 MG/ML ophthalmic solution 1 drop  1 drop Both Eyes BID Emokpae, Ejiroghene E, MD   1 drop at 12/21/20 0842   hydrALAZINE (APRESOLINE) tablet 50 mg  50 mg Oral TID Emokpae, Ejiroghene E, MD   50 mg at 12/21/20 0842   labetalol (NORMODYNE) injection 10 mg  10 mg Intravenous Q4H PRN Emokpae, Courage, MD       melatonin tablet 6 mg  6 mg Oral QHS Emokpae, Ejiroghene E, MD   6 mg at 12/20/20 2020   ondansetron (ZOFRAN) tablet 4 mg  4 mg Oral Q6H PRN Emokpae, Ejiroghene E, MD       Or   ondansetron (ZOFRAN) injection 4 mg  4 mg Intravenous Q6H PRN Emokpae, Ejiroghene E, MD       polyethylene glycol (MIRALAX / GLYCOLAX) packet 17 g  17 g Oral Daily PRN Emokpae, Ejiroghene E, MD       senna-docusate (Senokot-S) tablet 2 tablet  2 tablet Oral QHS Emokpae, Ejiroghene E, MD   2 tablet at 12/20/20 2022   sertraline (ZOLOFT) tablet 50 mg  50 mg Oral Daily Emokpae, Ejiroghene E, MD   50 mg at 12/21/20 8841   sodium bicarbonate tablet 650 mg  650 mg Oral BID Roxan Hockey, MD   650 mg at 12/21/20 6606     Discharge Medications: TAKE these medications     acetaminophen 500 MG tablet Commonly known as: TYLENOL Take 500 mg by mouth every 6 (six) hours as needed (pain).    albuterol 108 (90 Base) MCG/ACT inhaler Commonly known as: VENTOLIN HFA Inhale 2 puffs into the lungs every 6 (six) hours as needed for wheezing or shortness of breath.    ascorbic acid 500 MG tablet Commonly known as: VITAMIN C Take 1 tablet (500  mg total) by mouth daily.    donepezil 5 MG tablet Commonly known as: ARICEPT Take 1 tablet (5 mg total) by mouth at bedtime.    dorzolamide-timolol 22.3-6.8 MG/ML ophthalmic solution Commonly known as: COSOPT Place 1 drop into both eyes 2 (two) times daily.    guaiFENesin-dextromethorphan 100-10 MG/5ML syrup Commonly known as: ROBITUSSIN DM Take 10 mLs by mouth every 4 (four) hours as needed for cough.    hydrALAZINE 50 MG tablet Commonly known as: APRESOLINE Take 1 tablet (50 mg total) by mouth 3 (three) times daily.    latanoprost 0.005 % ophthalmic solution Commonly known as: XALATAN Place 1 drop into both eyes daily.    melatonin 5 MG Tabs Take  5 mg by mouth at bedtime.    NIFEdipine 90 MG 24 hr tablet Commonly known as: ADALAT CC Take 1 tablet (90 mg total) by mouth daily.    polyethylene glycol 17 g packet Commonly known as: MIRALAX / GLYCOLAX Take 17 g by mouth daily as needed for mild constipation. What changed: when to take this reasons to take this    senna-docusate 8.6-50 MG tablet Commonly known as: Senokot-S Take 2 tablets by mouth at bedtime.    sertraline 50 MG tablet Commonly known as: ZOLOFT Take 50 mg by mouth daily.    sodium bicarbonate 650 MG tablet Take 1 tablet (650 mg total) by mouth 2 (two) times daily.    Vitamin D3 125 MCG (5000 UT) Tabs Take 1 tablet by mouth daily. What changed: Another medication with the same name was removed. Continue taking this medication, and follow the directions you see here.    zinc sulfate 220 (50 Zn) MG capsule Take 1 capsule (220 mg total) by mouth daily.      Relevant Imaging Results:  Relevant Lab Results:   Additional Information PT SSN: [id number]  Ihor Gully, LCSW

## 2020-12-21 NOTE — Discharge Summary (Signed)
Physician Discharge Summary  Cindy Schultz:[id number] DOB: May 21, 1924 DOA: 12/14/2020  PCP: Pcp, No  Admit date: 12/14/2020 Discharge date: 12/21/2020  Admitted From: ALF Disposition: ALF with [facility] palliative services to follow  Recommendations for Outpatient Follow-up:  Patient will return to ALF with [facility] palliative services to follow.  Plans are to "treat the treatable" Repeat CBC, bmet in 1 week  Home Health: Equipment/Devices: Hospital bed  Discharge Condition: Stable CODE STATUS: DNR Diet recommendation: Heart healthy  Brief/Interim Summary: 87 year old female with a history of hypertension, chronic kidney disease stage IIIb, recent COVID-19 infection, dementia, was admitted to the hospital with concerns for GI bleeding.  Discharge Diagnoses:  Principal Problem:   GI bleed Active Problems:   HTN (hypertension)   SIRS (systemic inflammatory response syndrome) (HCC)   CKD (chronic kidney disease), stage IIIB  Acute GI bleed -Presented with painless rectal bleeding, suspect diverticular etiology -Seen by gastroenterology, CT abdomen indicated colonic diverticulosis -After discussion with patient's daughter, family reported they would want to avoid invasive procedures such as colonoscopy/EGD. -Patient was monitored in the hospital overall symptoms and bleeding have resolved -Hemoglobin has stabilized -She did not require transfusion of PRBC -Repeat CBC in 1 week  Dehydration and hyponatremia -Resolved with IV fluids  Chronic kidney disease stage IIIb -Creatinine appears to be near baseline -We will hold off on resuming Lasix since she is prone to dehydration and worsening renal failure  Hypertension -Stable on hydralazine and nifedipine  SIRS/acute metabolic encephalopathy -Patient did not have any fevers or clear source of infection-she was treated with Rocephin and Flagyl for intra-abdominal concerns -No signs of ongoing infection at  present  Dementia -Continue Aricept and sertraline  Goals of care -Seen by palliative care -After discussing with family, patient's daughter agrees to DNR -Plans are to return to ALF with palliative care services to follow   Discharge Instructions  Discharge Instructions     Diet - low sodium heart healthy   Complete by: As directed    Increase activity slowly   Complete by: As directed       Allergies as of 12/21/2020       Reactions   Codeine Nausea And Vomiting   Aspirin    Cannot take due to Diverticulosis (bleeding side effects)   Pepto-bismol [bismuth Subsalicylate]    Black stools   Hydrocodone Other (See Comments)   Unknown Reaction         Medication List     STOP taking these medications    furosemide 20 MG tablet Commonly known as: LASIX   gabapentin 100 MG capsule Commonly known as: NEURONTIN   potassium chloride SA 20 MEQ tablet Commonly known as: KLOR-CON       TAKE these medications    acetaminophen 500 MG tablet Commonly known as: TYLENOL Take 500 mg by mouth every 6 (six) hours as needed (pain).   albuterol 108 (90 Base) MCG/ACT inhaler Commonly known as: VENTOLIN HFA Inhale 2 puffs into the lungs every 6 (six) hours as needed for wheezing or shortness of breath.   ascorbic acid 500 MG tablet Commonly known as: VITAMIN C Take 1 tablet (500 mg total) by mouth daily.   donepezil 5 MG tablet Commonly known as: ARICEPT Take 1 tablet (5 mg total) by mouth at bedtime.   dorzolamide-timolol 22.3-6.8 MG/ML ophthalmic solution Commonly known as: COSOPT Place 1 drop into both eyes 2 (two) times daily.   guaiFENesin-dextromethorphan 100-10 MG/5ML syrup Commonly known as: ROBITUSSIN DM Take 10 mLs  by mouth every 4 (four) hours as needed for cough.   hydrALAZINE 50 MG tablet Commonly known as: APRESOLINE Take 1 tablet (50 mg total) by mouth 3 (three) times daily.   latanoprost 0.005 % ophthalmic solution Commonly known as:  XALATAN Place 1 drop into both eyes daily.   melatonin 5 MG Tabs Take 5 mg by mouth at bedtime.   NIFEdipine 90 MG 24 hr tablet Commonly known as: ADALAT CC Take 1 tablet (90 mg total) by mouth daily.   polyethylene glycol 17 g packet Commonly known as: MIRALAX / GLYCOLAX Take 17 g by mouth daily as needed for mild constipation. What changed:  when to take this reasons to take this   senna-docusate 8.6-50 MG tablet Commonly known as: Senokot-S Take 2 tablets by mouth at bedtime.   sertraline 50 MG tablet Commonly known as: ZOLOFT Take 50 mg by mouth daily.   sodium bicarbonate 650 MG tablet Take 1 tablet (650 mg total) by mouth 2 (two) times daily.   Vitamin D3 125 MCG (5000 UT) Tabs Take 1 tablet by mouth daily. What changed: Another medication with the same name was removed. Continue taking this medication, and follow the directions you see here.   zinc sulfate 220 (50 Zn) MG capsule Take 1 capsule (220 mg total) by mouth daily.               Durable Medical Equipment  (From admission, onward)           Start     Ordered   12/21/20 1437  For home use only DME Hospital bed  Once       Question Answer Comment  Length of Need Lifetime   The above medical condition requires: Patient requires the ability to reposition frequently   Head must be elevated greater than: 30 degrees   Bed type Semi-electric      12/21/20 [place], [facility] Follow up.   Why: Hospital Bed Contact information: [address] [place] [address] [phone]                Allergies  Allergen Reactions   Codeine Nausea And Vomiting   Aspirin     Cannot take due to Diverticulosis (bleeding side effects)   Pepto-Bismol [Bismuth Subsalicylate]     Black stools   Hydrocodone Other (See Comments)    Unknown Reaction     Consultations: Gastroenterology Palliative  care   Procedures/Studies: CT ABDOMEN PELVIS WO CONTRAST  Result Date: 12/15/2020 CLINICAL DATA:  87 year old female with left lower quadrant abdominal pain and GI bleed. EXAM: CT ABDOMEN AND PELVIS WITHOUT CONTRAST TECHNIQUE: Multidetector CT imaging of the abdomen and pelvis was performed following the standard protocol without IV contrast. COMPARISON:  CT abdomen pelvis dated 08/28/2017. FINDINGS: Evaluation of this exam is limited in the absence of intravenous contrast. Lower chest: Small bilateral pleural effusions with associated partial compressive atelectasis of the lower lobes. Pneumonia is not excluded clinical correlation is recommended. There is coronary vascular calcification. No intra-abdominal free air or free fluid. Hepatobiliary: The liver is unremarkable. No intrahepatic biliary dilatation. Several small stones within the gallbladder. The gallbladder is mildly distended. No pericholecystic fluid or evidence of acute cholecystitis by CT. Ultrasound may provide better evaluation if there is high clinical concern for acute cholecystitis. Pancreas: Unremarkable. No pancreatic ductal dilatation or surrounding inflammatory changes. Spleen: Normal  in size without focal abnormality. Adrenals/Urinary Tract: The adrenal glands are unremarkable. The kidneys, visualized ureters, and urinary bladder are unremarkable. Stomach/Bowel: There is severe sigmoid diverticulosis and diffuse colonic diverticula without active inflammatory changes. There is no bowel obstruction. The appendix is not visualized with certainty. No inflammatory changes identified in the right lower quadrant. Vascular/Lymphatic: Moderate aortoiliac atherosclerotic disease. The IVC is unremarkable. No portal venous gas. There is no adenopathy. Reproductive: Hysterectomy. No adnexal masses. Other: None Musculoskeletal: Osteopenia with degenerative changes of the spine and hips. L3 vertebroplasty. No acute osseous pathology. IMPRESSION: 1.  No acute intra-abdominal or pelvic pathology. 2. Severe colonic diverticulosis. No bowel obstruction. 3. Cholelithiasis. 4. Small bilateral pleural effusions with associated partial compressive atelectasis of the lower lobes. Pneumonia is not excluded clinical correlation is recommended. 5. Aortic Atherosclerosis (ICD10-I70.0). Electronically Signed   By: Anner Crete M.D.   On: 12/15/2020 16:36   CT Head Wo Contrast  Result Date: 11/30/2020 CLINICAL DATA:  87 year old female with altered mental status. EXAM: CT HEAD WITHOUT CONTRAST TECHNIQUE: Contiguous axial images were obtained from the base of the skull through the vertex without intravenous contrast. COMPARISON:  Head CT dated 05/29/2011. FINDINGS: Brain: Mild age-related atrophy and chronic microvascular ischemic changes. There is no acute intracranial hemorrhage. No mass effect or midline shift. No extra-axial fluid collection. Vascular: No hyperdense vessel or unexpected calcification. Skull: Normal. Negative for fracture or focal lesion. Sinuses/Orbits: There is complete opacification of the left maxillary sinus with chronic remodeling and destruction of the medial wall. Bilateral mastoid effusions. No air-fluid level. Other: None IMPRESSION: 1. No acute intracranial pathology. 2. Mild age-related atrophy and chronic microvascular ischemic changes. 3. Chronic left maxillary sinus disease. 4. Bilateral mastoid effusions. Electronically Signed   By: Anner Crete M.D.   On: 11/30/2020 16:20   DG CHEST PORT 1 VIEW  Result Date: 12/14/2020 CLINICAL DATA:  87 year old female with fever. EXAM: PORTABLE CHEST 1 VIEW COMPARISON:  Chest radiograph dated 12/30/2017. FINDINGS: No focal consolidation, pleural effusion, or pneumothorax. The cardiac silhouette is within limits. Atherosclerotic calcification of the aorta. Osteopenia with degenerative changes of the spine. No acute osseous pathology. IMPRESSION: No active disease. Electronically Signed    By: Anner Crete M.D.   On: 12/14/2020 20:31   DG Chest Portable 1 View  Result Date: 11/30/2020 CLINICAL DATA:  Altered mental status. EXAM: PORTABLE CHEST 1 VIEW COMPARISON:  Single-view of the chest 12/30/2017 and CT chest 12/31/2017. FINDINGS: Trace bilateral pleural effusions and mild basilar atelectasis. Heart size is normal. Aortic atherosclerosis. No pneumothorax. No acute or focal bony abnormality. IMPRESSION: Trace bilateral pleural effusions and mild bibasilar atelectasis. Aortic Atherosclerosis (ICD10-I70.0). Electronically Signed   By: Inge Rise M.D.   On: 11/30/2020 15:42      Subjective: Patient is sitting up in the chair, does not appear to be in distress, does not answer questions.  She is working on much  Discharge Exam: Vitals:   12/20/20 2049 12/21/20 0432 12/21/20 0842 12/21/20 1410  BP: (!) 152/65 (!) 196/89 (!) 157/92 (!) 159/79  Pulse: (!) 55 72  (!) 57  Resp: 20 20  19   Temp: 98.4 F (36.9 C) 97.6 F (36.4 C)  97.9 F (36.6 C)  TempSrc: Axillary Oral  Oral  SpO2: 95% 91%  98%  Weight:      Height:        General: Pt is alert, awake, not in acute distress Cardiovascular: RRR, S1/S2 +, no rubs, no gallops Respiratory: CTA bilaterally, no  wheezing, no rhonchi Abdominal: Soft, NT, ND, bowel sounds + Extremities: no edema, no cyanosis    The results of significant diagnostics from this hospitalization (including imaging, microbiology, ancillary and laboratory) are listed below for reference.     Microbiology: Recent Results (from the past 240 hour(s))  Culture, blood (routine x 2)     Status: None   Collection Time: 12/14/20  9:49 PM   Specimen: Left Antecubital; Blood  Result Value Ref Range Status   Specimen Description LEFT ANTECUBITAL  Final   Special Requests   Final    BOTTLES DRAWN AEROBIC AND ANAEROBIC Blood Culture adequate volume   Culture   Final    NO GROWTH 7 DAYS Performed at [facility], [address], [place],  [place] [address]    Report Status 12/21/2020 FINAL  Final  Culture, blood (routine x 2)     Status: None   Collection Time: 12/14/20 10:00 PM   Specimen: BLOOD LEFT HAND  Result Value Ref Range Status   Specimen Description BLOOD LEFT HAND  Final   Special Requests   Final    BOTTLES DRAWN AEROBIC AND ANAEROBIC Blood Culture adequate volume   Culture   Final    NO GROWTH 7 DAYS Performed at [facility], [address] [place], [place] [address]    Report Status 12/21/2020 FINAL  Final  Culture, Urine     Status: None   Collection Time: 12/14/20 10:45 PM   Specimen: Urine, Clean Catch  Result Value Ref Range Status   Specimen Description   Final    URINE, CLEAN CATCH Performed at [facility], [address], [place], [place] [address]    Special Requests   Final    NONE Performed at [facility], [address], [place], [place] [address]    Culture   Final    NO GROWTH Performed at [facility], [place] [address], [place], [place] [address]    Report Status 12/16/2020 FINAL  Final  MRSA Next Gen by PCR, Nasal     Status: None   Collection Time: 12/20/20  5:39 PM   Specimen: Nasal Mucosa; Nasal Swab  Result Value Ref Range Status   MRSA by PCR Next Gen NOT DETECTED NOT DETECTED Final    Comment: (NOTE) The GeneXpert MRSA Assay (FDA approved for NASAL specimens only), is one component of a comprehensive MRSA colonization surveillance program. It is not intended to diagnose MRSA infection nor to guide or monitor treatment for MRSA infections. Test performance is not FDA approved in patients less than 37 years old. Performed at [facility], [address] [place], [place] [address]      Labs: BNP (last 3 results) No results for input(s): BNP in the last 8760 hours. Basic Metabolic Panel: Recent Labs  Lab 12/16/20 0614 12/17/20 0632 12/19/20 0920 12/20/20 0432 12/21/20 0951  NA 137 137 138 138 138  K 3.3* 3.9 2.6* 3.1* 4.4  CL 108 108 111 110 113*  CO2 19* 15* 18*  19* 17*  GLUCOSE 99 114* 138* 86 129*  BUN 32* 33* 43* 44* 47*  CREATININE 1.42* 1.49* 1.75* 1.80* 1.78*  CALCIUM 8.0* 8.4* 8.1* 8.3* 8.1*  MG  --   --   --  2.5*  --   PHOS  --  3.5  --  3.1  --    Liver Function Tests: Recent Labs  Lab 12/14/20 1751 12/17/20 0632 12/20/20 0432  AST 25  --   --  ALT 18  --   --   ALKPHOS 78  --   --   BILITOT 0.5  --   --   PROT 6.6  --   --   ALBUMIN 3.6 3.2* 2.7*   No results for input(s): LIPASE, AMYLASE in the last 168 hours. No results for input(s): AMMONIA in the last 168 hours. CBC: Recent Labs  Lab 12/14/20 1751 12/14/20 1756 12/16/20 0614 12/17/20 0632 12/19/20 0920 12/20/20 0900 12/21/20 0951  WBC 12.5*   < > 17.0* 11.0* 11.5* 13.0* 11.0*  NEUTROABS 10.3*  --   --   --   --   --   --   HGB 8.4*   < > 10.8* 12.0 9.9* 9.6* 10.0*  HCT 26.1*   < > 32.7* 38.5 31.1* 29.4* 30.7*  MCV 99.2   < > 94.5 98.7 96.9 95.8 95.6  PLT 251   < > 170 173 96* 88* 96*   < > = values in this interval not displayed.   Cardiac Enzymes: No results for input(s): CKTOTAL, CKMB, CKMBINDEX, TROPONINI in the last 168 hours. BNP: Invalid input(s): POCBNP CBG: No results for input(s): GLUCAP in the last 168 hours. D-Dimer No results for input(s): DDIMER in the last 72 hours. Hgb A1c No results for input(s): HGBA1C in the last 72 hours. Lipid Profile No results for input(s): CHOL, HDL, LDLCALC, TRIG, CHOLHDL, LDLDIRECT in the last 72 hours. Thyroid function studies No results for input(s): TSH, T4TOTAL, T3FREE, THYROIDAB in the last 72 hours.  Invalid input(s): FREET3 Anemia work up No results for input(s): VITAMINB12, FOLATE, FERRITIN, TIBC, IRON, RETICCTPCT in the last 72 hours. Urinalysis    Component Value Date/Time   COLORURINE YELLOW 11/30/2020 1511   APPEARANCEUR CLEAR 11/30/2020 1511   LABSPEC 1.016 11/30/2020 1511   PHURINE 6.0 11/30/2020 1511   GLUCOSEU 50 (A) 11/30/2020 1511   HGBUR SMALL (A) 11/30/2020 1511   BILIRUBINUR  NEGATIVE 11/30/2020 1511   KETONESUR NEGATIVE 11/30/2020 1511   PROTEINUR 100 (A) 11/30/2020 1511   UROBILINOGEN 0.2 08/05/2009 1533   NITRITE NEGATIVE 11/30/2020 1511   LEUKOCYTESUR NEGATIVE 11/30/2020 1511   Sepsis Labs Invalid input(s): PROCALCITONIN,  WBC,  LACTICIDVEN Microbiology Recent Results (from the past 240 hour(s))  Culture, blood (routine x 2)     Status: None   Collection Time: 12/14/20  9:49 PM   Specimen: Left Antecubital; Blood  Result Value Ref Range Status   Specimen Description LEFT ANTECUBITAL  Final   Special Requests   Final    BOTTLES DRAWN AEROBIC AND ANAEROBIC Blood Culture adequate volume   Culture   Final    NO GROWTH 7 DAYS Performed at [facility], [address] [place], [place] [address]    Report Status 12/21/2020 FINAL  Final  Culture, blood (routine x 2)     Status: None   Collection Time: 12/14/20 10:00 PM   Specimen: BLOOD LEFT HAND  Result Value Ref Range Status   Specimen Description BLOOD LEFT HAND  Final   Special Requests   Final    BOTTLES DRAWN AEROBIC AND ANAEROBIC Blood Culture adequate volume   Culture   Final    NO GROWTH 7 DAYS Performed at [facility], [address] [place], [place] [address]    Report Status 12/21/2020 FINAL  Final  Culture, Urine     Status: None   Collection Time: 12/14/20 10:45 PM   Specimen: Urine, Clean Catch  Result Value Ref Range Status  Specimen Description   Final    URINE, CLEAN CATCH Performed at [facility], [address] [place], [place] [address]    Special Requests   Final    NONE Performed at [facility], [address], [place], [place] [address]    Culture   Final    NO GROWTH Performed at [name], [place] [address] [place], [place] [address]    Report Status 12/16/2020 FINAL  Final  MRSA Next Gen by PCR, Nasal     Status: None   Collection Time: 12/20/20  5:39 PM   Specimen: Nasal Mucosa; Nasal Swab  Result Value Ref Range Status   MRSA by PCR Next Gen NOT  DETECTED NOT DETECTED Final    Comment: (NOTE) The GeneXpert MRSA Assay (FDA approved for NASAL specimens only), is one component of a comprehensive MRSA colonization surveillance program. It is not intended to diagnose MRSA infection nor to guide or monitor treatment for MRSA infections. Test performance is not FDA approved in patients less than 1 years old. Performed at [facility], [address], [place],  [address]      Time coordinating discharge: 75mins  SIGNED:   Kathie Dike, MD  [facility] 12/21/2020, 2:38 PM   If 7PM-7AM, please contact night-coverage [email or web address]

## 2020-12-22 ENCOUNTER — Telehealth: Payer: Self-pay

## 2020-12-22 NOTE — Telephone Encounter (Signed)
Telephone Call/Scheduled Initial Visit SW completed call with patient/PCG to schedule an initial palliative care visit. Visit scheduled for 12/29/20 @ 12 pm. Patient is a resident at [facility].

## 2020-12-26 ENCOUNTER — Non-Acute Institutional Stay: Payer: Medicare Other | Admitting: Nurse Practitioner

## 2020-12-26 ENCOUNTER — Other Ambulatory Visit: Payer: Self-pay

## 2020-12-26 VITALS — BP 122/60 | HR 67 | Resp 18

## 2020-12-26 DIAGNOSIS — F0280 Dementia in other diseases classified elsewhere without behavioral disturbance: Secondary | ICD-10-CM

## 2020-12-26 DIAGNOSIS — Z515 Encounter for palliative care: Secondary | ICD-10-CM

## 2020-12-26 DIAGNOSIS — R600 Localized edema: Secondary | ICD-10-CM

## 2020-12-26 DIAGNOSIS — R638 Other symptoms and signs concerning food and fluid intake: Secondary | ICD-10-CM

## 2020-12-26 DIAGNOSIS — R531 Weakness: Secondary | ICD-10-CM

## 2020-12-26 NOTE — Progress Notes (Signed)
[name] Consult Note Telephone: [phone]  Fax: [phone]    Date of encounter: 12/26/20 PATIENT NAME: Cindy Schultz [address] [place] [address]   [phone] (home)  DOB: 1924-02-22 MRN: [id number]  PRIMARY CARE PROVIDER:    Charleston Ropes  REFERRING PROVIDER:   Mitchel Honour, MD  RESPONSIBLE PARTY:    Contact Information     Name Relation Home Work Mobile   Cindy Schultz Daughter [phone]  [phone]     I met face to face with patient in facility. Resident Care coordinator present in home during visit.  Palliative Care was asked to follow this patient by consultation request of  Dr. Mitchel Honour o address advance care planning and complex medical decision making. This is the initial visit.                                   ASSESSMENT AND PLAN / RECOMMENDATIONS:   Advance Care Planning/Goals of Care: Goals of palliative care include to maximize quality of life and symptom management. Patient unable to participate in visit discussion due to poor cognition related to advance dementia.  CODE STATUS: DNR Directives: Signed DNR and MOST forms present on chart in the facility. Detail of MOST form include limited additional interventions, antibiotics if indicated, IV fluids if indicated, no feeding tube.   Symptom Management/Plan: Generalized weakness: Patient pending evaluation and treatment with physical therapy. Continue to offer assistance with ADLs while encouraging patient to do as much as possible for self. Localized edema: right arm edema appeared dependent. Encourage staff to elevate arm on a pillow while sitting. Turn and reposition patient routinely when in bed. Decrease oral intake: continue to encourage adequate oral intake. Consider offering nutritional supplements between meals such as Ensure to augment calorie intake.   Dementia: staff report worsening decline in both function and cognition.  Discussion held with patient's daughter, she reiterated goal of comfort is patient 's goal of care. She desires no aggressive treatment for patient. She verbalized awareness of patient's worsening condition with regards to progression of her dementia and kidney function. She verbalized interest in Hospice care saying she does not want patient to suffer as she is reaching her end of life. Patient 's daughter works as a [employer] manager and verbalized awareness of what can happen with progression of kidney disease. Validation provided. Decision was made to deescalate care from disease focused care. Outpatient Hospice services and philosophy were discussed and explained in detail. Cindy Schultz expressed interest in Hospice. Palliative care will continue to provide support to patient, family and medical team.  Follow up Palliative Care Visit: Palliative care will continue to follow for complex medical decision making, advance care planning, and clarification of goals. Return as needed  PPS: 30%  HOSPICE ELIGIBILITY/DIAGNOSIS: yes/advance dementia  Chief Complaint: generalized weakness  History obtained from review of Epic EMR, discussion with facility staff and interview with patient's daughter Cindy Schultz.  HISTORY OF PRESENT ILLNESS:  Cindy Schultz is a 87 y.o. year old female with multiple medical problems including dementia (FAST 7d), hypertension, chronic kidney disease stage IIIb, hx of recent COVID-19 infection. Patient with complaints of generalized weakness in the context of recent hospitalization from 12/14/2020 to 12/21/2020 for GI bleed. Generalized weakness is associated with deconditioning which was reported to e ongoing since patient was diagnosed with Covid infection earlier this year. Patient today is tal assist with ADLs  including feeding, she is unable to self transfer or sit up unassisted. Staff report patient with poor oral take, report intake to be <25%.  Rectal bleed stopped before hospital  discharge. Patient also noted to be in stage 3 renal diease. Patient awake during visit, she is very hard of hearing and non-verbal during visit. Staff report occasional difficulty swallowing her meals. No report of fever, chills, increased SOB or coughing or spluttering during meals.  CBC Latest Ref Rng & Units 12/21/2020 12/20/2020 12/19/2020  WBC 4.0 - 10.5 K/uL 11.0(H) 13.0(H) 11.5(H)  Hemoglobin 12.0 - 15.0 g/dL 10.0(L) 9.6(L) 9.9(L)  Hematocrit 36.0 - 46.0 % 30.7(L) 29.4(L) 31.1(L)  Platelets 150 - 400 K/uL 96(L) 88(L) 96(L)    BMP Latest Ref Rng & Units 12/21/2020 12/20/2020 12/19/2020  Glucose 70 - 99 mg/dL 129(H) 86 138(H)  BUN 8 - 23 mg/dL 47(H) 44(H) 43(H)  Creatinine 0.44 - 1.00 mg/dL 1.78(H) 1.80(H) 1.75(H)  Sodium 135 - 145 mmol/L 138 138 138  Potassium 3.5 - 5.1 mmol/L 4.4 3.1(L) 2.6(LL)  Chloride 98 - 111 mmol/L 113(H) 110 111  CO2 22 - 32 mmol/L 17(L) 19(L) 18(L)  Calcium 8.9 - 10.3 mg/dL 8.1(L) 8.3(L) 8.1(L)    Reviewed CT of abdomen completed in hospital on 6/30/202: was negative for acute intra-abdominal or pelvic pathology, or bowel obstruction. It did show severe colonic diverticulosis.   I reviewed available labs, medications, imaging, studies and related documents from the EMR.  Records reviewed and summarized above.   Physical Exam: General: chronically ill and frail appearing, sitting in a recliner in her room in NAD EYES: anicteric sclera, no discharge  ENMT: very hard of hearing, oral mucous membranes moist CV: RRR, no LE edema, right arm edema Pulmonary: LCTA, no increased work of breathing, no cough, room air Abdomen: intake <25%, soft and non tender, no ascites GU: deferred MSK: sarcopenia, moves all extremities, non-ambulatory Skin: warm and dry Neuro: generalized weakness,  severe cognitive impairment Psych: non-anxious affect, confused Hem/lymph/immuno: no widespread bruising  CURRENT PROBLEM LIST:  Patient Active Problem List   Diagnosis Date Noted   CKD  (chronic kidney disease), stage IIIB 12/15/2020   SIRS (systemic inflammatory response syndrome) ([place]) 66/44/0347   Acute metabolic encephalopathy 42/59/5638   AMS (altered mental status) 11/30/2020   COVID-19 virus infection 11/30/2020   Sepsis ([place]) 12/30/2017   Pressure injury of skin 12/30/2017   Cellulitis of left leg 12/30/2017   HCAP (healthcare-associated pneumonia) 12/30/2017   Glaucoma (increased eye pressure) 12/30/2017   Compression fracture of lumbar spine, non-traumatic, initial encounter ([place]) 12/17/2017   Back pain 12/12/2017   Abdominal pain 08/28/2017   Primary osteoarthritis of right hip 07/19/2017   Tear of right rotator cuff 07/19/2017   CKD (chronic kidney disease) stage 3, GFR 30-59 ml/min (HCC) 05/22/2017   Macrocytosis    Diverticulosis of large intestine with hemorrhage    Rectal bleed 05/19/2017   CKD (chronic kidney disease) stage 4, GFR 15-29 ml/min (HCC) 05/19/2017   LLQ pain 03/08/2017   Colon polyp 03/08/2017   Constipation 03/08/2017   Acute blood loss anemia 09/10/2014   GIB (gastrointestinal bleeding) 09/08/2014   Occlusion and stenosis of carotid artery without mention of cerebral infarction 12/14/2013   Osteoarthritis of left knee 04/23/2013   Thumb pain 10/22/2012   De Quervain's syndrome (tenosynovitis) 10/22/2012   CMC DJD(carpometacarpal degenerative joint disease), localized primary 10/22/2012   CTS (carpal tunnel syndrome) 10/22/2012   Trigger thumb of right hand 10/22/2012   Pes anserinus  bursitis of right knee 06/26/2012   OA (osteoarthritis) of knee 06/26/2012   Leg pain, left 06/26/2012   Radicular pain of left lower extremity 06/26/2012   Diverticulosis of colon with hemorrhage 09/22/2011   Syncopal episodes 09/20/2011   GI bleed 09/19/2011   HTN (hypertension) 09/19/2011   Anemia due to blood loss, acute 09/19/2011   Abnormality of gait 01/03/2011   Personal history of fall 12/27/2010   DISLOCATION CLOSED SHOULDER NEC  04/25/2010   ANSERINE BURSITIS, LEFT 02/13/2010   KNEE PAIN 12/22/2009   LEG PAIN, BILATERAL 12/22/2009   TOTAL KNEE FOLLOW-UP 12/22/2009   HIP PAIN 02/07/2009   LOW BACK PAIN 02/07/2009   PAST MEDICAL HISTORY:  Active Ambulatory Problems    Diagnosis Date Noted   HIP PAIN 02/07/2009   KNEE PAIN 12/22/2009   LOW BACK PAIN 02/07/2009   ANSERINE BURSITIS, LEFT 02/13/2010   LEG PAIN, BILATERAL 12/22/2009   DISLOCATION CLOSED SHOULDER NEC 04/25/2010   TOTAL KNEE FOLLOW-UP 12/22/2009   Personal history of fall 12/27/2010   Abnormality of gait 01/03/2011   GI bleed 09/19/2011   HTN (hypertension) 09/19/2011   Anemia due to blood loss, acute 09/19/2011   Syncopal episodes 09/20/2011   Diverticulosis of colon with hemorrhage 09/22/2011   Pes anserinus bursitis of right knee 06/26/2012   OA (osteoarthritis) of knee 06/26/2012   Leg pain, left 06/26/2012   Radicular pain of left lower extremity 06/26/2012   Thumb pain 10/22/2012   De Quervain's syndrome (tenosynovitis) 10/22/2012   CMC DJD(carpometacarpal degenerative joint disease), localized primary 10/22/2012   CTS (carpal tunnel syndrome) 10/22/2012   Trigger thumb of right hand 10/22/2012   Osteoarthritis of left knee 04/23/2013   Occlusion and stenosis of carotid artery without mention of cerebral infarction 12/14/2013   GIB (gastrointestinal bleeding) 09/08/2014   Acute blood loss anemia 09/10/2014   LLQ pain 03/08/2017   Colon polyp 03/08/2017   Constipation 03/08/2017   Rectal bleed 05/19/2017   CKD (chronic kidney disease) stage 4, GFR 15-29 ml/min (HCC) 05/19/2017   Macrocytosis    Diverticulosis of large intestine with hemorrhage    CKD (chronic kidney disease) stage 3, GFR 30-59 ml/min (HCC) 05/22/2017   Primary osteoarthritis of right hip 07/19/2017   Tear of right rotator cuff 07/19/2017   Abdominal pain 08/28/2017   Back pain 12/12/2017   Compression fracture of lumbar spine, non-traumatic, initial encounter  ([place]) 12/17/2017   Sepsis ([place]) 12/30/2017   Pressure injury of skin 12/30/2017   Cellulitis of left leg 12/30/2017   HCAP (healthcare-associated pneumonia) 12/30/2017   Glaucoma (increased eye pressure) 12/30/2017   AMS (altered mental status) 11/30/2020   COVID-19 virus infection 08/65/7846   Acute metabolic encephalopathy 96/29/5284   SIRS (systemic inflammatory response syndrome) ([place]) 12/14/2020   CKD (chronic kidney disease), stage IIIB 12/15/2020   Resolved Ambulatory Problems    Diagnosis Date Noted   No Resolved Ambulatory Problems   Past Medical History:  Diagnosis Date   Cancer [facility]) March 2016   Cancer [facility]) 2013   Carotid artery occlusion    Chronic kidney disease    Diverticulitis    DVT (deep venous thrombosis) ([place]) 07/14/10   GI bleeding    Glaucoma    Hearing loss    Lumbar spine pain    Lymphedema    Macular degeneration    Pain in joints    Precancerous changes of the cervix    Rectocele    SOB (shortness of breath) 04/04/2010   SOCIAL  HX:  Social History   Tobacco Use   Smoking status: Never   Smokeless tobacco: Never  Substance Use Topics   Alcohol use: No   FAMILY HX:  Family History  Problem Relation Age of Onset   Stroke Mother    Diabetes Mother    Heart attack Father    COPD Paternal Grandfather    Dementia Sister    Colon cancer Neg Hx      ALLERGIES:  Allergies  Allergen Reactions   Codeine Nausea And Vomiting   Aspirin     Cannot take due to Diverticulosis (bleeding side effects)   Pepto-Bismol [Bismuth Subsalicylate]     Black stools   Hydrocodone Other (See Comments)    Unknown Reaction      PERTINENT MEDICATIONS:  Outpatient Encounter Medications as of 12/26/2020  Medication Sig   acetaminophen (TYLENOL) 500 MG tablet Take 500 mg by mouth every 6 (six) hours as needed (pain).    albuterol (VENTOLIN HFA) 108 (90 Base) MCG/ACT inhaler Inhale 2 puffs into the lungs every 6 (six) hours as needed for wheezing or shortness  of breath.   ascorbic acid (VITAMIN C) 500 MG tablet Take 1 tablet (500 mg total) by mouth daily.   Cholecalciferol (VITAMIN D3) 125 MCG (5000 UT) TABS Take 1 tablet by mouth daily.   donepezil (ARICEPT) 5 MG tablet Take 1 tablet (5 mg total) by mouth at bedtime.   dorzolamide-timolol (COSOPT) 22.3-6.8 MG/ML ophthalmic solution Place 1 drop into both eyes 2 (two) times daily.     guaiFENesin-dextromethorphan (ROBITUSSIN DM) 100-10 MG/5ML syrup Take 10 mLs by mouth every 4 (four) hours as needed for cough.   hydrALAZINE (APRESOLINE) 50 MG tablet Take 1 tablet (50 mg total) by mouth 3 (three) times daily.   latanoprost (XALATAN) 0.005 % ophthalmic solution Place 1 drop into both eyes daily.   Melatonin 5 MG TABS Take 5 mg by mouth at bedtime.   NIFEdipine (ADALAT CC) 90 MG 24 hr tablet Take 1 tablet (90 mg total) by mouth daily.   polyethylene glycol (MIRALAX / GLYCOLAX) 17 g packet Take 17 g by mouth daily as needed for mild constipation.   senna-docusate (SENOKOT-S) 8.6-50 MG tablet Take 2 tablets by mouth at bedtime.   sertraline (ZOLOFT) 50 MG tablet Take 50 mg by mouth daily.   sodium bicarbonate 650 MG tablet Take 1 tablet (650 mg total) by mouth 2 (two) times daily.   zinc sulfate 220 (50 Zn) MG capsule Take 1 capsule (220 mg total) by mouth daily.   [DISCONTINUED] ipratropium-albuterol (DUONEB) 0.5-2.5 (3) MG/3ML SOLN Take 3 mLs by nebulization every 6 (six) hours as needed. (Patient not taking: Reported on 11/30/2020)   [DISCONTINUED] losartan (COZAAR) 25 MG tablet Take 12.5 mg by mouth daily.   [DISCONTINUED] promethazine (PHENERGAN) 12.5 MG tablet Take 1 tablet (12.5 mg total) by mouth every 6 (six) hours as needed for nausea or vomiting. (Patient not taking: Reported on 11/30/2020)   No facility-administered encounter medications on file as of 12/26/2020.   Thank you for the opportunity to participate in the care of Cindy Schultz.  The palliative care team will continue to follow. Please  call our office at [phone] if we can be of additional assistance.   Jari Favre, DNP, AGPCNP-BC  COVID-19 PATIENT SCREENING TOOL Asked and negative response unless otherwise noted:   Have you had symptoms of covid, tested positive or been in contact with someone with symptoms/positive test in the past 5-10 days?

## 2020-12-29 ENCOUNTER — Other Ambulatory Visit: Payer: Self-pay | Admitting: *Deleted

## 2020-12-29 ENCOUNTER — Other Ambulatory Visit: Payer: Self-pay

## 2021-01-03 ENCOUNTER — Emergency Department (HOSPITAL_COMMUNITY)
Admission: EM | Admit: 2021-01-03 | Discharge: 2021-01-03 | Disposition: A | Discharge status: Home / Self Care | Payer: Medicare Other | Attending: Emergency Medicine | Admitting: Emergency Medicine

## 2021-01-03 ENCOUNTER — Encounter (HOSPITAL_COMMUNITY): Payer: Self-pay | Admitting: Emergency Medicine

## 2021-01-03 ENCOUNTER — Emergency Department (HOSPITAL_COMMUNITY): Payer: Medicare Other

## 2021-01-03 ENCOUNTER — Other Ambulatory Visit: Payer: Self-pay

## 2021-01-03 DIAGNOSIS — S0181XA Laceration without foreign body of other part of head, initial encounter: Secondary | ICD-10-CM | POA: Diagnosis not present

## 2021-01-03 DIAGNOSIS — Z23 Encounter for immunization: Secondary | ICD-10-CM | POA: Insufficient documentation

## 2021-01-03 DIAGNOSIS — W06XXXA Fall from bed, initial encounter: Secondary | ICD-10-CM | POA: Insufficient documentation

## 2021-01-03 DIAGNOSIS — S51011A Laceration without foreign body of right elbow, initial encounter: Secondary | ICD-10-CM

## 2021-01-03 DIAGNOSIS — Z8616 Personal history of COVID-19: Secondary | ICD-10-CM | POA: Insufficient documentation

## 2021-01-03 DIAGNOSIS — Z85828 Personal history of other malignant neoplasm of skin: Secondary | ICD-10-CM | POA: Diagnosis not present

## 2021-01-03 DIAGNOSIS — F0390 Unspecified dementia without behavioral disturbance: Secondary | ICD-10-CM | POA: Diagnosis not present

## 2021-01-03 DIAGNOSIS — I129 Hypertensive chronic kidney disease with stage 1 through stage 4 chronic kidney disease, or unspecified chronic kidney disease: Secondary | ICD-10-CM | POA: Diagnosis not present

## 2021-01-03 DIAGNOSIS — W19XXXA Unspecified fall, initial encounter: Secondary | ICD-10-CM

## 2021-01-03 DIAGNOSIS — Y92129 Unspecified place in nursing home as the place of occurrence of the external cause: Secondary | ICD-10-CM | POA: Insufficient documentation

## 2021-01-03 DIAGNOSIS — S0990XA Unspecified injury of head, initial encounter: Secondary | ICD-10-CM | POA: Diagnosis present

## 2021-01-03 DIAGNOSIS — N184 Chronic kidney disease, stage 4 (severe): Secondary | ICD-10-CM | POA: Diagnosis not present

## 2021-01-03 MED ORDER — TETANUS-DIPHTH-ACELL PERTUSSIS 5-2.5-18.5 LF-MCG/0.5 IM SUSY
0.5000 mL | PREFILLED_SYRINGE | Freq: Once | INTRAMUSCULAR | Status: AC
  Administered 2021-01-03: 0.5 mL via INTRAMUSCULAR
  Filled 2021-01-03: qty 0.5

## 2021-01-03 MED ORDER — LIDOCAINE-EPINEPHRINE (PF) 1 %-1:200000 IJ SOLN
20.0000 mL | Freq: Once | INTRAMUSCULAR | Status: AC
  Administered 2021-01-03: 20 mL

## 2021-01-03 NOTE — ED Notes (Signed)
Pt sees hospice and has a yellow dnr at bedside

## 2021-01-03 NOTE — ED Triage Notes (Signed)
Pt was tangled in sheets and fell off bed. Pt has laceration to the head and skin tear to right elbow.

## 2021-01-03 NOTE — ED Provider Notes (Signed)
[facility] EMERGENCY DEPARTMENT Provider Note   CSN: [id number] Arrival date & time: 01/03/21  0256     History Chief Complaint  Patient presents with   Lytle Michaels    Cindy Schultz is a 87 y.o. female.  The history is provided by the EMS personnel. The history is limited by the condition of the patient (Dementia).  Fall She has history of hypertension, chronic kidney disease and comes in by ambulance following a fall at her nursing home.  She apparently got tangled in the sheets and fell off the bed.  She suffered a laceration to her forehead and skin tear to her right elbow.   Past Medical History:  Diagnosis Date   Cancer [facility]) March 2016   Skin Cancer :  Right Cheek     SCC   Cancer  [facility]) 2013   Skin Cancer  :  Anterior Neck   SCC   Carotid artery occlusion    Chronic kidney disease    Diverticulitis    DVT (deep venous thrombosis) ([place]) 07/14/10   LEA doppler    GI bleeding    2013/2016/2018: suspected diverticular bleeding   Glaucoma    Hearing loss    HTN (hypertension)    cholesterol   Lumbar spine pain    Lymphedema    Macular degeneration    Pain in joints    Precancerous changes of the cervix    lesions   Rectocele    SOB (shortness of breath) 04/04/2010   2D echo EF=>55%    Patient Active Problem List   Diagnosis Date Noted   CKD (chronic kidney disease), stage IIIB 12/15/2020   SIRS (systemic inflammatory response syndrome) ([place]) 73/41/9379   Acute metabolic encephalopathy 02/40/9735   AMS (altered mental status) 11/30/2020   COVID-19 virus infection 11/30/2020   Sepsis ([place]) 12/30/2017   Pressure injury of skin 12/30/2017   Cellulitis of left leg 12/30/2017   HCAP (healthcare-associated pneumonia) 12/30/2017   Glaucoma (increased eye pressure) 12/30/2017   Compression fracture of lumbar spine, non-traumatic, initial encounter ([place]) 12/17/2017   Back pain 12/12/2017   Abdominal pain 08/28/2017   Primary osteoarthritis of right hip 07/19/2017    Tear of right rotator cuff 07/19/2017   CKD (chronic kidney disease) stage 3, GFR 30-59 ml/min (HCC) 05/22/2017   Macrocytosis    Diverticulosis of large intestine with hemorrhage    Rectal bleed 05/19/2017   CKD (chronic kidney disease) stage 4, GFR 15-29 ml/min ([place]) 05/19/2017   LLQ pain 03/08/2017   Colon polyp 03/08/2017   Constipation 03/08/2017   Acute blood loss anemia 09/10/2014   GIB (gastrointestinal bleeding) 09/08/2014   Occlusion and stenosis of carotid artery without mention of cerebral infarction 12/14/2013   Osteoarthritis of left knee 04/23/2013   Thumb pain 10/22/2012   De Quervain's syndrome (tenosynovitis) 10/22/2012   CMC DJD(carpometacarpal degenerative joint disease), localized primary 10/22/2012   CTS (carpal tunnel syndrome) 10/22/2012   Trigger thumb of right hand 10/22/2012   Pes anserinus bursitis of right knee 06/26/2012   OA (osteoarthritis) of knee 06/26/2012   Leg pain, left 06/26/2012   Radicular pain of left lower extremity 06/26/2012   Diverticulosis of colon with hemorrhage 09/22/2011   Syncopal episodes 09/20/2011   GI bleed 09/19/2011   HTN (hypertension) 09/19/2011   Anemia due to blood loss, acute 09/19/2011   Abnormality of gait 01/03/2011   Personal history of fall 12/27/2010   DISLOCATION CLOSED SHOULDER NEC 04/25/2010   ANSERINE BURSITIS, LEFT 02/13/2010  KNEE PAIN 12/22/2009   LEG PAIN, BILATERAL 12/22/2009   TOTAL KNEE FOLLOW-UP 12/22/2009   HIP PAIN 02/07/2009   LOW BACK PAIN 02/07/2009    Past Surgical History:  Procedure Laterality Date   ABDOMINAL HYSTERECTOMY     appendix     bladder tac     COLONOSCOPY  09/20/2011   Dr. Carol Ada: pandiverticulosis, hemorrhoids. ascending colon polyp not removed in setting of active bleeding.   COLONOSCOPY N/A 04/10/2017   Dr. [name]: three 4-6 mm polyps in ascending colon and cecum, s/p removal. tubular adenomas. diverticulosis in entire examined colon.    corneal erosion      cataracts    elevated metatarsal arch     fallen uterus     hemmorrhoidectomy     IR KYPHO LUMBAR INC FX REDUCE BONE BX UNI/BIL CANNULATION INC/IMAGING  12/17/2017   left total knee replacement  05/02/04   [facility]   met osteostomy     morton's nueroma     OVARIAN CYST SURGERY     RECTOCELE REPAIR     rupture rt groin     salk     SKIN CANCER EXCISION Right March 2016  2013   Cheek -  [facility]  and  Anterior Neck   tracheotomy       OB History   No obstetric history on file.     Family History  Problem Relation Age of Onset   Stroke Mother    Diabetes Mother    Heart attack Father    COPD Paternal Grandfather    Dementia Sister    Colon cancer Neg Hx     Social History   Tobacco Use   Smoking status: Never   Smokeless tobacco: Never  Vaping Use   Vaping Use: Never used  Substance Use Topics   Alcohol use: No   Drug use: No    Home Medications Prior to Admission medications   Medication Sig Start Date End Date Taking? Authorizing Provider  acetaminophen (TYLENOL) 500 MG tablet Take 500 mg by mouth every 6 (six) hours as needed (pain).     [provider]  albuterol (VENTOLIN HFA) 108 (90 Base) MCG/ACT inhaler Inhale 2 puffs into the lungs every 6 (six) hours as needed for wheezing or shortness of breath. 12/02/20   Roxan Hockey, MD  ascorbic acid (VITAMIN C) 500 MG tablet Take 1 tablet (500 mg total) by mouth daily. 12/02/20   Roxan Hockey, MD  Cholecalciferol (VITAMIN D3) 125 MCG (5000 UT) TABS Take 1 tablet by mouth daily.    [provider]  donepezil (ARICEPT) 5 MG tablet Take 1 tablet (5 mg total) by mouth at bedtime. 01/06/18   Sinda Du, MD  dorzolamide-timolol (COSOPT) 22.3-6.8 MG/ML ophthalmic solution Place 1 drop into both eyes 2 (two) times daily.      [provider]  guaiFENesin-dextromethorphan (ROBITUSSIN DM) 100-10 MG/5ML syrup Take 10 mLs by mouth every 4 (four) hours as needed for cough. 12/02/20   Roxan Hockey,  MD  hydrALAZINE (APRESOLINE) 50 MG tablet Take 1 tablet (50 mg total) by mouth 3 (three) times daily. 12/02/20   Emokpae, Courage, MD  latanoprost (XALATAN) 0.005 % ophthalmic solution Place 1 drop into both eyes daily.    [provider]  Melatonin 5 MG TABS Take 5 mg by mouth at bedtime.    [provider]  NIFEdipine (ADALAT CC) 90 MG 24 hr tablet Take 1 tablet (90 mg total) by mouth daily. 12/02/20  Roxan Hockey, MD  polyethylene glycol (MIRALAX / GLYCOLAX) 17 g packet Take 17 g by mouth daily as needed for mild constipation. 12/21/20   Kathie Dike, MD  senna-docusate (SENOKOT-S) 8.6-50 MG tablet Take 2 tablets by mouth at bedtime. 12/02/20 12/02/21  Roxan Hockey, MD  sertraline (ZOLOFT) 50 MG tablet Take 50 mg by mouth daily.    [provider]  sodium bicarbonate 650 MG tablet Take 1 tablet (650 mg total) by mouth 2 (two) times daily. 12/21/20   Kathie Dike, MD  zinc sulfate 220 (50 Zn) MG capsule Take 1 capsule (220 mg total) by mouth daily. 12/02/20   Emokpae, Courage, MD  ipratropium-albuterol (DUONEB) 0.5-2.5 (3) MG/3ML SOLN Take 3 mLs by nebulization every 6 (six) hours as needed. Patient not taking: Reported on 11/30/2020 01/06/18 12/02/20  Sinda Du, MD  losartan (COZAAR) 25 MG tablet Take 12.5 mg by mouth daily. 12/25/14 12/02/20  [provider]  promethazine (PHENERGAN) 12.5 MG tablet Take 1 tablet (12.5 mg total) by mouth every 6 (six) hours as needed for nausea or vomiting. Patient not taking: Reported on 11/30/2020 10/21/17 12/02/20  Carole Civil, MD    Allergies    Codeine, Aspirin, Pepto-bismol [bismuth subsalicylate], and Hydrocodone  Review of Systems   Review of Systems  Unable to perform ROS: Dementia   Physical Exam Updated Vital Signs BP (!) 149/67   Pulse 71   Resp 18   Ht '5\' 4"'  (1.626 m)   Wt 60 kg   SpO2 94%   BMI 22.71 kg/m   Physical Exam Vitals and nursing note reviewed.  87 year old female, resting  comfortably and in no acute distress. Vital signs are significant for elevated blood pressure. Oxygen saturation is 94%, which is normal. Head is normocephalic.  Laceration is present on the left side of the forehead. PERRLA, EOMI. Oropharynx is clear. Neck is immobilized in a stiff cervical collar and is nontender without adenopathy or JVD. Back is nontender and there is no CVA tenderness. Lungs are clear without rales, wheezes, or rhonchi. Chest is nontender. Heart has regular rate and rhythm without murmur. Abdomen is soft, flat, nontender without masses or hepatosplenomegaly and peristalsis is normoactive. Extremities have no cyanosis or edema.  Skin tear present right elbow.  Markedly increased muscle tone with tremor, complains of passive movement of any joint but passive range of motion appears grossly normal throughout. Skin is warm and dry without rash. Neurologic: Awake but nonverbal, does not answer questions or follow commands.  Moves all extremities equally, no focal deficits seen.  ED Results / Procedures / Treatments    EKG EKG Interpretation  Date/Time:  Tuesday January 03 2021 03:13:49 EDT Ventricular Rate:  94 PR Interval:    QRS Duration: 115 QT Interval:  434 QTC Calculation: 507 R Axis:   23 Text Interpretation: Undetermined rhythm Ventricular premature complex Nonspecific intraventricular conduction delay Probable anteroseptal infarct, old Artifact When compared with ECG of 12/14/2020, No significant change was found Confirmed by Delora Fuel ([id number]) on 01/03/2021 4:13:28 AM  Radiology CT Head Wo Contrast  Result Date: 01/03/2021 CLINICAL DATA:  Fall, head injury EXAM: CT HEAD WITHOUT CONTRAST CT CERVICAL SPINE WITHOUT CONTRAST TECHNIQUE: Multidetector CT imaging of the head and cervical spine was performed following the standard protocol without intravenous contrast. Multiplanar CT image reconstructions of the cervical spine were also generated. COMPARISON:  CT head  01/03/2021 FINDINGS: CT HEAD FINDINGS Brain: Normal anatomic configuration. Parenchymal volume loss is commensurate with the patient's  age. Moderate periventricular white matter changes are present likely reflecting the sequela of small vessel ischemia. No abnormal intra or extra-axial mass lesion or fluid collection. No abnormal mass effect or midline shift. No evidence of acute intracranial hemorrhage or infarct. Mild ventriculomegaly is stable since prior examination. Cerebellum unremarkable. Vascular: No asymmetric hyperdense vasculature at the skull base. Skull: Intact Sinuses/Orbits: As noted previously, there is dense opacification of the left maxillary sinus. There is hyperostosis of the walls of the maxillary antrum as well as mild expansion of the chamber suggesting a a chronic inflammatory process. There is high density material centrally which appears to expand the ostium of the sinus extend into the nasal cavity possibly representing a antral choanal polyp. Remaining sinuses are clear. Orbits are unremarkable. Other: There is fluid opacification of the mastoid air cells and middle ear cavities bilaterally, new since prior examination. No associated osseous erosion. Mild left frontal soft tissue swelling and scalp laceration noted. CT CERVICAL SPINE FINDINGS Alignment: 2 mm anterolisthesis of C2 upon C3 and C3 upon C4 is likely degenerative in nature. Ankylosis of the right facet joint of C2-3 and left facet joint of C4-5. Skull base and vertebrae: The craniocervical alignment is normal. The atlantodental interval is not widened. No acute fracture of the cervical spine. Vertebral body height has been preserved. Soft tissues and spinal canal: Posterior disc osteophyte complex at C3-4 abuts and mildly flattens the thecal sac. The spinal canal is otherwise widely patent. The prevertebral soft tissues are not thickened. No paraspinal fluid collections are identified. Disc levels: There is intervertebral disc  space narrowing and endplate remodeling of W4-M6 in keeping with changes of moderate to severe degenerative disc disease. Milder degenerative changes are seen throughout the remainder of the cervical spine. Sagittal reformats demonstrate no thickening of the prevertebral soft tissues. Review of the axial images demonstrates multilevel uncovertebral and facet arthrosis resulting in multilevel neuroforaminal narrowing, most severe at C4-5 and C5-6. Upper chest: Unremarkable Other: Advanced atherosclerotic calcification is seen within the a left carotid bifurcation. IMPRESSION: No acute intracranial injury. No calvarial fracture. Mild left frontal soft tissue swelling and scalp laceration. Moderate senescent change.  Stable mild ventriculomegaly. Stable hyperdense suspected mass widening the left maxillary ostium and extending into the nasopharynx possibly representing a a antro choanal polyp. Persistent dense opacification of the left maxillary sinus in keeping with changes of chronic sinusitis with associated osseous changes. No acute fracture or listhesis of the cervical spine. Multilevel degenerative disc and degenerative joint disease resulting in multilevel moderate to severe neuroforaminal narrowing. Advanced vascular calcifications within the left carotid bifurcation. If indicated, this would be better assessed with dedicated sonography Electronically Signed   By: Fidela Salisbury MD   On: 01/03/2021 04:17   CT Cervical Spine Wo Contrast  Result Date: 01/03/2021 CLINICAL DATA:  Fall, head injury EXAM: CT HEAD WITHOUT CONTRAST CT CERVICAL SPINE WITHOUT CONTRAST TECHNIQUE: Multidetector CT imaging of the head and cervical spine was performed following the standard protocol without intravenous contrast. Multiplanar CT image reconstructions of the cervical spine were also generated. COMPARISON:  CT head 01/03/2021 FINDINGS: CT HEAD FINDINGS Brain: Normal anatomic configuration. Parenchymal volume loss is  commensurate with the patient's age. Moderate periventricular white matter changes are present likely reflecting the sequela of small vessel ischemia. No abnormal intra or extra-axial mass lesion or fluid collection. No abnormal mass effect or midline shift. No evidence of acute intracranial hemorrhage or infarct. Mild ventriculomegaly is stable since prior examination. Cerebellum unremarkable. Vascular: No  asymmetric hyperdense vasculature at the skull base. Skull: Intact Sinuses/Orbits: As noted previously, there is dense opacification of the left maxillary sinus. There is hyperostosis of the walls of the maxillary antrum as well as mild expansion of the chamber suggesting a a chronic inflammatory process. There is high density material centrally which appears to expand the ostium of the sinus extend into the nasal cavity possibly representing a antral choanal polyp. Remaining sinuses are clear. Orbits are unremarkable. Other: There is fluid opacification of the mastoid air cells and middle ear cavities bilaterally, new since prior examination. No associated osseous erosion. Mild left frontal soft tissue swelling and scalp laceration noted. CT CERVICAL SPINE FINDINGS Alignment: 2 mm anterolisthesis of C2 upon C3 and C3 upon C4 is likely degenerative in nature. Ankylosis of the right facet joint of C2-3 and left facet joint of C4-5. Skull base and vertebrae: The craniocervical alignment is normal. The atlantodental interval is not widened. No acute fracture of the cervical spine. Vertebral body height has been preserved. Soft tissues and spinal canal: Posterior disc osteophyte complex at C3-4 abuts and mildly flattens the thecal sac. The spinal canal is otherwise widely patent. The prevertebral soft tissues are not thickened. No paraspinal fluid collections are identified. Disc levels: There is intervertebral disc space narrowing and endplate remodeling of J8-I3 in keeping with changes of moderate to severe  degenerative disc disease. Milder degenerative changes are seen throughout the remainder of the cervical spine. Sagittal reformats demonstrate no thickening of the prevertebral soft tissues. Review of the axial images demonstrates multilevel uncovertebral and facet arthrosis resulting in multilevel neuroforaminal narrowing, most severe at C4-5 and C5-6. Upper chest: Unremarkable Other: Advanced atherosclerotic calcification is seen within the a left carotid bifurcation. IMPRESSION: No acute intracranial injury. No calvarial fracture. Mild left frontal soft tissue swelling and scalp laceration. Moderate senescent change.  Stable mild ventriculomegaly. Stable hyperdense suspected mass widening the left maxillary ostium and extending into the nasopharynx possibly representing a a antro choanal polyp. Persistent dense opacification of the left maxillary sinus in keeping with changes of chronic sinusitis with associated osseous changes. No acute fracture or listhesis of the cervical spine. Multilevel degenerative disc and degenerative joint disease resulting in multilevel moderate to severe neuroforaminal narrowing. Advanced vascular calcifications within the left carotid bifurcation. If indicated, this would be better assessed with dedicated sonography Electronically Signed   By: Fidela Salisbury MD   On: 01/03/2021 04:17    Procedures .Marland KitchenLaceration Repair  Date/Time: 01/03/2021 5:28 AM Performed by: Delora Fuel, MD Authorized by: Delora Fuel, MD   Consent:    Consent obtained:  Verbal   Consent given by:  Healthcare agent   Risks, benefits, and alternatives were discussed: yes     Risks discussed:  Infection, poor cosmetic result and poor wound healing   Alternatives discussed:  No treatment Universal protocol:    Procedure explained and questions answered to patient or proxy's satisfaction: yes     Relevant documents present and verified: yes     Test results available: yes     Imaging studies  available: yes     Required blood products, implants, devices, and special equipment available: yes     Site/side marked: yes     Immediately prior to procedure, a time out was called: yes     Patient identity confirmed:  Hospital-assigned identification number and arm band Anesthesia:    Anesthesia method:  Local infiltration   Local anesthetic:  Lidocaine 1% WITH epi Laceration details:  Location:  Face   Face location:  Forehead   Length (cm):  4   Depth (mm):  4 Pre-procedure details:    Preparation:  Patient was prepped and draped in usual sterile fashion and imaging obtained to evaluate for foreign bodies Exploration:    Limited defect created (wound extended): no     Hemostasis achieved with:  Direct pressure   Imaging obtained: x-ray     Imaging outcome: foreign body not noted     Wound exploration: entire depth of wound visualized     Wound extent: no foreign bodies/material noted     Contaminated: no   Treatment:    Area cleansed with:  Saline   Amount of cleaning:  Standard   Debridement:  None   Undermining:  None   Scar revision: no   Skin repair:    Repair method:  Sutures   Suture size:  5-0   Suture material:  Nylon   Suture technique:  Running Approximation:    Approximation:  Close Repair type:    Repair type:  Simple Post-procedure details:    Dressing:  Antibiotic ointment and sterile dressing   Procedure completion:  Tolerated well, no immediate complications   Medications Ordered in ED Medications  lidocaine-EPINEPHrine (XYLOCAINE-EPINEPHrine) 1 %-1:200000 (PF) injection 20 mL (has no administration in time range)  Tdap (BOOSTRIX) injection 0.5 mL (has no administration in time range)    ED Course  I have reviewed the triage vital signs and the nursing notes.  Pertinent imaging results that were available during my care of the patient were reviewed by me and considered in my medical decision making (see chart for details).   MDM  Rules/Calculators/A&P                         Fall with laceration of forehead and skin tear of right elbow.  Laceration is closed with sutures.  Old records are reviewed and she has no prior ED visits for falls.  Mental status with today's exam is similar to what was recorded on recent history and physical for hospital admission.  No record of tetanus immunization, Tdap booster is given.  CT of head and cervical spine showed no acute injury.  She is discharged back to her skilled nursing facility.  Sutures are to be removed in 5 days.  Final Clinical Impression(s) / ED Diagnoses Final diagnoses:  None    Rx / DC Orders ED Discharge Orders     None        Delora Fuel, MD 44/62/86 [id number]

## 2021-01-03 NOTE — ED Notes (Signed)
[facility] notified of pt needing ride to [place]

## 2021-01-03 NOTE — ED Notes (Signed)
Daughter at bedside.

## 2021-01-03 NOTE — ED Notes (Signed)
3cm laceration to left upper forehead

## 2021-01-03 NOTE — ED Notes (Signed)
Ems here to transport back back to [place].

## 2021-01-16 DEATH — deceased

## 2021-02-16 DEATH — deceased
